# Patient Record
Sex: Female | Born: 1950 | Race: Black or African American | Hispanic: No | Smoking: Former smoker
Health system: Southern US, Community
[De-identification: ages and names within clinical notes are randomized; demographics above are authoritative.]

## PROBLEM LIST (undated history)

## (undated) DIAGNOSIS — G8929 Other chronic pain: Secondary | ICD-10-CM

## (undated) DIAGNOSIS — M25519 Pain in unspecified shoulder: Secondary | ICD-10-CM

## (undated) DIAGNOSIS — Z9889 Other specified postprocedural states: Secondary | ICD-10-CM

## (undated) DIAGNOSIS — R51 Headache: Secondary | ICD-10-CM

## (undated) DIAGNOSIS — K5790 Diverticulosis of intestine, part unspecified, without perforation or abscess without bleeding: Secondary | ICD-10-CM

## (undated) DIAGNOSIS — R112 Nausea with vomiting, unspecified: Secondary | ICD-10-CM

## (undated) DIAGNOSIS — J449 Chronic obstructive pulmonary disease, unspecified: Secondary | ICD-10-CM

## (undated) DIAGNOSIS — M1990 Unspecified osteoarthritis, unspecified site: Secondary | ICD-10-CM

## (undated) DIAGNOSIS — F419 Anxiety disorder, unspecified: Secondary | ICD-10-CM

## (undated) DIAGNOSIS — M545 Low back pain, unspecified: Secondary | ICD-10-CM

## (undated) DIAGNOSIS — F319 Bipolar disorder, unspecified: Secondary | ICD-10-CM

## (undated) DIAGNOSIS — M542 Cervicalgia: Secondary | ICD-10-CM

## (undated) DIAGNOSIS — R011 Cardiac murmur, unspecified: Secondary | ICD-10-CM

## (undated) DIAGNOSIS — C3490 Malignant neoplasm of unspecified part of unspecified bronchus or lung: Secondary | ICD-10-CM

## (undated) DIAGNOSIS — A0472 Enterocolitis due to Clostridium difficile, not specified as recurrent: Secondary | ICD-10-CM

## (undated) DIAGNOSIS — Z8719 Personal history of other diseases of the digestive system: Secondary | ICD-10-CM

## (undated) DIAGNOSIS — R569 Unspecified convulsions: Secondary | ICD-10-CM

## (undated) DIAGNOSIS — M51369 Other intervertebral disc degeneration, lumbar region without mention of lumbar back pain or lower extremity pain: Secondary | ICD-10-CM

## (undated) DIAGNOSIS — I10 Essential (primary) hypertension: Secondary | ICD-10-CM

## (undated) DIAGNOSIS — B1920 Unspecified viral hepatitis C without hepatic coma: Secondary | ICD-10-CM

## (undated) DIAGNOSIS — M5136 Other intervertebral disc degeneration, lumbar region: Secondary | ICD-10-CM

## (undated) DIAGNOSIS — K219 Gastro-esophageal reflux disease without esophagitis: Secondary | ICD-10-CM

## (undated) DIAGNOSIS — B1910 Unspecified viral hepatitis B without hepatic coma: Secondary | ICD-10-CM

## (undated) DIAGNOSIS — M5430 Sciatica, unspecified side: Secondary | ICD-10-CM

## (undated) HISTORY — PX: BRAIN SURGERY: SHX531

## (undated) HISTORY — PX: TUBAL LIGATION: SHX77

## (undated) HISTORY — PX: TONSILLECTOMY: SUR1361

---

## 1998-11-20 HISTORY — PX: OTHER SURGICAL HISTORY: SHX169

## 2007-11-21 HISTORY — PX: OTHER SURGICAL HISTORY: SHX169

## 2011-03-05 ENCOUNTER — Emergency Department (HOSPITAL_COMMUNITY)
Admission: EM | Admit: 2011-03-05 | Discharge: 2011-03-05 | Disposition: A | Discharge status: Home / Self Care | Payer: Medicaid - Out of State | Attending: Emergency Medicine | Admitting: Emergency Medicine

## 2011-03-05 DIAGNOSIS — M542 Cervicalgia: Secondary | ICD-10-CM | POA: Insufficient documentation

## 2011-03-05 DIAGNOSIS — M129 Arthropathy, unspecified: Secondary | ICD-10-CM | POA: Insufficient documentation

## 2011-03-05 DIAGNOSIS — Z7982 Long term (current) use of aspirin: Secondary | ICD-10-CM | POA: Insufficient documentation

## 2011-03-05 DIAGNOSIS — F319 Bipolar disorder, unspecified: Secondary | ICD-10-CM | POA: Insufficient documentation

## 2011-03-05 DIAGNOSIS — G8929 Other chronic pain: Secondary | ICD-10-CM | POA: Insufficient documentation

## 2011-03-05 DIAGNOSIS — M25519 Pain in unspecified shoulder: Secondary | ICD-10-CM | POA: Insufficient documentation

## 2011-03-05 DIAGNOSIS — E119 Type 2 diabetes mellitus without complications: Secondary | ICD-10-CM | POA: Insufficient documentation

## 2011-03-05 DIAGNOSIS — Z79899 Other long term (current) drug therapy: Secondary | ICD-10-CM | POA: Insufficient documentation

## 2011-03-05 DIAGNOSIS — I10 Essential (primary) hypertension: Secondary | ICD-10-CM | POA: Insufficient documentation

## 2011-03-05 DIAGNOSIS — K219 Gastro-esophageal reflux disease without esophagitis: Secondary | ICD-10-CM | POA: Insufficient documentation

## 2011-03-30 ENCOUNTER — Emergency Department (HOSPITAL_COMMUNITY): Payer: Medicaid Other

## 2011-03-30 ENCOUNTER — Emergency Department (HOSPITAL_COMMUNITY)
Admission: EM | Admit: 2011-03-30 | Discharge: 2011-03-30 | Disposition: A | Discharge status: Home / Self Care | Payer: Medicaid Other | Attending: Emergency Medicine | Admitting: Emergency Medicine

## 2011-03-30 DIAGNOSIS — Z79899 Other long term (current) drug therapy: Secondary | ICD-10-CM | POA: Insufficient documentation

## 2011-03-30 DIAGNOSIS — R0602 Shortness of breath: Secondary | ICD-10-CM | POA: Insufficient documentation

## 2011-03-30 DIAGNOSIS — R630 Anorexia: Secondary | ICD-10-CM | POA: Insufficient documentation

## 2011-03-30 DIAGNOSIS — F3130 Bipolar disorder, current episode depressed, mild or moderate severity, unspecified: Secondary | ICD-10-CM | POA: Insufficient documentation

## 2011-03-30 DIAGNOSIS — G4700 Insomnia, unspecified: Secondary | ICD-10-CM | POA: Insufficient documentation

## 2011-03-30 DIAGNOSIS — Z7982 Long term (current) use of aspirin: Secondary | ICD-10-CM | POA: Insufficient documentation

## 2011-03-30 DIAGNOSIS — R61 Generalized hyperhidrosis: Secondary | ICD-10-CM | POA: Insufficient documentation

## 2011-03-30 DIAGNOSIS — F411 Generalized anxiety disorder: Secondary | ICD-10-CM | POA: Insufficient documentation

## 2011-03-30 DIAGNOSIS — I10 Essential (primary) hypertension: Secondary | ICD-10-CM | POA: Insufficient documentation

## 2011-03-30 DIAGNOSIS — R450 Nervousness: Secondary | ICD-10-CM | POA: Insufficient documentation

## 2011-03-30 DIAGNOSIS — E119 Type 2 diabetes mellitus without complications: Secondary | ICD-10-CM | POA: Insufficient documentation

## 2011-03-30 LAB — POCT I-STAT, CHEM 8
BUN: 17 mg/dL (ref 6–23)
Hemoglobin: 14.6 g/dL (ref 12.0–15.0)
Potassium: 3.9 mEq/L (ref 3.5–5.1)
Sodium: 138 mEq/L (ref 135–145)
TCO2: 22 mmol/L (ref 0–100)

## 2011-03-30 LAB — POCT CARDIAC MARKERS: Myoglobin, poc: 57.3 ng/mL (ref 12–200)

## 2011-04-08 ENCOUNTER — Emergency Department (HOSPITAL_COMMUNITY)
Admission: EM | Admit: 2011-04-08 | Discharge: 2011-04-08 | Disposition: A | Discharge status: Home / Self Care | Payer: Medicaid Other | Attending: Emergency Medicine | Admitting: Emergency Medicine

## 2011-04-08 DIAGNOSIS — E119 Type 2 diabetes mellitus without complications: Secondary | ICD-10-CM | POA: Insufficient documentation

## 2011-04-08 DIAGNOSIS — Z79899 Other long term (current) drug therapy: Secondary | ICD-10-CM | POA: Insufficient documentation

## 2011-04-08 DIAGNOSIS — F319 Bipolar disorder, unspecified: Secondary | ICD-10-CM | POA: Insufficient documentation

## 2011-04-08 DIAGNOSIS — I10 Essential (primary) hypertension: Secondary | ICD-10-CM | POA: Insufficient documentation

## 2011-04-08 DIAGNOSIS — M47812 Spondylosis without myelopathy or radiculopathy, cervical region: Secondary | ICD-10-CM | POA: Insufficient documentation

## 2011-04-08 DIAGNOSIS — M542 Cervicalgia: Secondary | ICD-10-CM | POA: Insufficient documentation

## 2011-04-08 DIAGNOSIS — M5412 Radiculopathy, cervical region: Secondary | ICD-10-CM | POA: Insufficient documentation

## 2011-04-08 DIAGNOSIS — M79609 Pain in unspecified limb: Secondary | ICD-10-CM | POA: Insufficient documentation

## 2011-04-17 ENCOUNTER — Emergency Department (HOSPITAL_COMMUNITY): Payer: Medicaid Other

## 2011-04-17 ENCOUNTER — Emergency Department (HOSPITAL_COMMUNITY)
Admission: EM | Admit: 2011-04-17 | Discharge: 2011-04-18 | Disposition: A | Discharge status: Home / Self Care | Payer: Medicaid Other | Attending: Emergency Medicine | Admitting: Emergency Medicine

## 2011-04-17 DIAGNOSIS — E119 Type 2 diabetes mellitus without complications: Secondary | ICD-10-CM | POA: Insufficient documentation

## 2011-04-17 DIAGNOSIS — R1032 Left lower quadrant pain: Secondary | ICD-10-CM | POA: Insufficient documentation

## 2011-04-17 DIAGNOSIS — I10 Essential (primary) hypertension: Secondary | ICD-10-CM | POA: Insufficient documentation

## 2011-04-17 DIAGNOSIS — K4020 Bilateral inguinal hernia, without obstruction or gangrene, not specified as recurrent: Secondary | ICD-10-CM | POA: Insufficient documentation

## 2011-04-17 DIAGNOSIS — F319 Bipolar disorder, unspecified: Secondary | ICD-10-CM | POA: Insufficient documentation

## 2011-04-17 DIAGNOSIS — Z79899 Other long term (current) drug therapy: Secondary | ICD-10-CM | POA: Insufficient documentation

## 2011-04-18 ENCOUNTER — Encounter (HOSPITAL_COMMUNITY): Payer: Self-pay

## 2011-04-18 ENCOUNTER — Emergency Department (HOSPITAL_COMMUNITY): Payer: Medicaid Other

## 2011-04-18 LAB — URINALYSIS, ROUTINE W REFLEX MICROSCOPIC
Glucose, UA: NEGATIVE mg/dL
Hgb urine dipstick: NEGATIVE
Ketones, ur: NEGATIVE mg/dL
Protein, ur: NEGATIVE mg/dL

## 2011-04-18 LAB — CBC
MCH: 29.4 pg (ref 26.0–34.0)
MCV: 89.3 fL (ref 78.0–100.0)
Platelets: 240 10*3/uL (ref 150–400)
RDW: 12.9 % (ref 11.5–15.5)

## 2011-04-18 LAB — DIFFERENTIAL
Eosinophils Absolute: 0.2 10*3/uL (ref 0.0–0.7)
Eosinophils Relative: 3 % (ref 0–5)
Lymphs Abs: 2.2 10*3/uL (ref 0.7–4.0)
Monocytes Relative: 9 % (ref 3–12)

## 2011-04-18 LAB — COMPREHENSIVE METABOLIC PANEL
Albumin: 3.6 g/dL (ref 3.5–5.2)
BUN: 9 mg/dL (ref 6–23)
Creatinine, Ser: 0.49 mg/dL (ref 0.4–1.2)
Total Bilirubin: 0.2 mg/dL — ABNORMAL LOW (ref 0.3–1.2)
Total Protein: 6.7 g/dL (ref 6.0–8.3)

## 2011-04-25 ENCOUNTER — Emergency Department (HOSPITAL_COMMUNITY): Payer: Medicaid Other

## 2011-04-25 ENCOUNTER — Emergency Department (HOSPITAL_COMMUNITY)
Admission: EM | Admit: 2011-04-25 | Discharge: 2011-04-25 | Disposition: A | Discharge status: Home / Self Care | Payer: Medicaid Other | Attending: Emergency Medicine | Admitting: Emergency Medicine

## 2011-04-25 DIAGNOSIS — K644 Residual hemorrhoidal skin tags: Secondary | ICD-10-CM | POA: Insufficient documentation

## 2011-04-25 DIAGNOSIS — Z79899 Other long term (current) drug therapy: Secondary | ICD-10-CM | POA: Insufficient documentation

## 2011-04-25 DIAGNOSIS — N949 Unspecified condition associated with female genital organs and menstrual cycle: Secondary | ICD-10-CM | POA: Insufficient documentation

## 2011-04-25 DIAGNOSIS — R059 Cough, unspecified: Secondary | ICD-10-CM | POA: Insufficient documentation

## 2011-04-25 DIAGNOSIS — I10 Essential (primary) hypertension: Secondary | ICD-10-CM | POA: Insufficient documentation

## 2011-04-25 DIAGNOSIS — N898 Other specified noninflammatory disorders of vagina: Secondary | ICD-10-CM | POA: Insufficient documentation

## 2011-04-25 DIAGNOSIS — F319 Bipolar disorder, unspecified: Secondary | ICD-10-CM | POA: Insufficient documentation

## 2011-04-25 DIAGNOSIS — E119 Type 2 diabetes mellitus without complications: Secondary | ICD-10-CM | POA: Insufficient documentation

## 2011-04-25 DIAGNOSIS — R109 Unspecified abdominal pain: Secondary | ICD-10-CM | POA: Insufficient documentation

## 2011-04-25 DIAGNOSIS — R05 Cough: Secondary | ICD-10-CM | POA: Insufficient documentation

## 2011-04-25 DIAGNOSIS — Z7982 Long term (current) use of aspirin: Secondary | ICD-10-CM | POA: Insufficient documentation

## 2011-04-25 DIAGNOSIS — R300 Dysuria: Secondary | ICD-10-CM | POA: Insufficient documentation

## 2011-04-25 LAB — WET PREP, GENITAL: Yeast Wet Prep HPF POC: NONE SEEN

## 2011-04-25 LAB — COMPREHENSIVE METABOLIC PANEL
Albumin: 3.5 g/dL (ref 3.5–5.2)
BUN: 13 mg/dL (ref 6–23)
Chloride: 103 mEq/L (ref 96–112)
Creatinine, Ser: 0.48 mg/dL (ref 0.4–1.2)
GFR calc non Af Amer: >60 mL/min (ref >60)
Glucose, Bld: 96 mg/dL (ref 70–99)
Total Bilirubin: 0.2 mg/dL — ABNORMAL LOW (ref 0.3–1.2)

## 2011-04-25 LAB — URINALYSIS, ROUTINE W REFLEX MICROSCOPIC
Glucose, UA: NEGATIVE mg/dL
Protein, ur: NEGATIVE mg/dL
pH: 7 (ref 5.0–8.0)

## 2011-04-26 LAB — GC/CHLAMYDIA PROBE AMP, GENITAL
Chlamydia, DNA Probe: NEGATIVE
GC Probe Amp, Genital: NEGATIVE

## 2011-05-13 ENCOUNTER — Emergency Department (HOSPITAL_COMMUNITY)
Admission: EM | Admit: 2011-05-13 | Discharge: 2011-05-13 | Disposition: A | Discharge status: Home / Self Care | Payer: Medicaid Other | Attending: Emergency Medicine | Admitting: Emergency Medicine

## 2011-05-13 DIAGNOSIS — I10 Essential (primary) hypertension: Secondary | ICD-10-CM | POA: Insufficient documentation

## 2011-05-13 DIAGNOSIS — Z79899 Other long term (current) drug therapy: Secondary | ICD-10-CM | POA: Insufficient documentation

## 2011-05-13 DIAGNOSIS — M25519 Pain in unspecified shoulder: Secondary | ICD-10-CM | POA: Insufficient documentation

## 2011-05-13 DIAGNOSIS — E119 Type 2 diabetes mellitus without complications: Secondary | ICD-10-CM | POA: Insufficient documentation

## 2011-05-13 DIAGNOSIS — F3130 Bipolar disorder, current episode depressed, mild or moderate severity, unspecified: Secondary | ICD-10-CM | POA: Insufficient documentation

## 2011-05-13 DIAGNOSIS — M542 Cervicalgia: Secondary | ICD-10-CM | POA: Insufficient documentation

## 2011-05-13 DIAGNOSIS — M719 Bursopathy, unspecified: Secondary | ICD-10-CM | POA: Insufficient documentation

## 2011-05-13 DIAGNOSIS — M67919 Unspecified disorder of synovium and tendon, unspecified shoulder: Secondary | ICD-10-CM | POA: Insufficient documentation

## 2011-05-13 DIAGNOSIS — G8929 Other chronic pain: Secondary | ICD-10-CM | POA: Insufficient documentation

## 2011-09-09 ENCOUNTER — Emergency Department (HOSPITAL_COMMUNITY)
Admission: EM | Admit: 2011-09-09 | Discharge: 2011-09-09 | Disposition: A | Discharge status: Home / Self Care | Payer: Medicaid Other | Attending: Emergency Medicine | Admitting: Emergency Medicine

## 2011-09-09 DIAGNOSIS — G8929 Other chronic pain: Secondary | ICD-10-CM | POA: Insufficient documentation

## 2011-09-09 DIAGNOSIS — F319 Bipolar disorder, unspecified: Secondary | ICD-10-CM | POA: Insufficient documentation

## 2011-09-09 DIAGNOSIS — E119 Type 2 diabetes mellitus without complications: Secondary | ICD-10-CM | POA: Insufficient documentation

## 2011-09-09 DIAGNOSIS — M542 Cervicalgia: Secondary | ICD-10-CM | POA: Insufficient documentation

## 2011-09-09 DIAGNOSIS — M129 Arthropathy, unspecified: Secondary | ICD-10-CM | POA: Insufficient documentation

## 2011-09-09 DIAGNOSIS — Z79899 Other long term (current) drug therapy: Secondary | ICD-10-CM | POA: Insufficient documentation

## 2011-09-09 DIAGNOSIS — M25519 Pain in unspecified shoulder: Secondary | ICD-10-CM | POA: Insufficient documentation

## 2011-09-09 DIAGNOSIS — I10 Essential (primary) hypertension: Secondary | ICD-10-CM | POA: Insufficient documentation

## 2011-09-09 DIAGNOSIS — M62838 Other muscle spasm: Secondary | ICD-10-CM | POA: Insufficient documentation

## 2011-11-09 ENCOUNTER — Emergency Department (HOSPITAL_COMMUNITY)
Admission: EM | Admit: 2011-11-09 | Discharge: 2011-11-09 | Disposition: A | Discharge status: Home / Self Care | Payer: Medicaid Other | Attending: Emergency Medicine | Admitting: Emergency Medicine

## 2011-11-09 ENCOUNTER — Encounter (HOSPITAL_COMMUNITY): Payer: Self-pay | Admitting: Emergency Medicine

## 2011-11-09 DIAGNOSIS — F319 Bipolar disorder, unspecified: Secondary | ICD-10-CM | POA: Insufficient documentation

## 2011-11-09 DIAGNOSIS — E119 Type 2 diabetes mellitus without complications: Secondary | ICD-10-CM | POA: Insufficient documentation

## 2011-11-09 DIAGNOSIS — F17200 Nicotine dependence, unspecified, uncomplicated: Secondary | ICD-10-CM | POA: Insufficient documentation

## 2011-11-09 DIAGNOSIS — M79609 Pain in unspecified limb: Secondary | ICD-10-CM | POA: Insufficient documentation

## 2011-11-09 DIAGNOSIS — G8929 Other chronic pain: Secondary | ICD-10-CM | POA: Insufficient documentation

## 2011-11-09 DIAGNOSIS — M25519 Pain in unspecified shoulder: Secondary | ICD-10-CM | POA: Insufficient documentation

## 2011-11-09 DIAGNOSIS — M542 Cervicalgia: Secondary | ICD-10-CM | POA: Insufficient documentation

## 2011-11-09 DIAGNOSIS — I10 Essential (primary) hypertension: Secondary | ICD-10-CM | POA: Insufficient documentation

## 2011-11-09 DIAGNOSIS — M25512 Pain in left shoulder: Secondary | ICD-10-CM

## 2011-11-09 HISTORY — DX: Bipolar disorder, unspecified: F31.9

## 2011-11-09 HISTORY — DX: Essential (primary) hypertension: I10

## 2011-11-09 MED ORDER — HYDROMORPHONE HCL PF 2 MG/ML IJ SOLN
2.0000 mg | Freq: Once | INTRAMUSCULAR | Status: AC
  Administered 2011-11-09: 2 mg via INTRAMUSCULAR
  Filled 2011-11-09: qty 1

## 2011-11-09 MED ORDER — DEXAMETHASONE SODIUM PHOSPHATE 10 MG/ML IJ SOLN
10.0000 mg | Freq: Once | INTRAMUSCULAR | Status: AC
  Administered 2011-11-09: 10 mg via INTRAMUSCULAR
  Filled 2011-11-09: qty 1

## 2011-11-09 MED ORDER — DEXAMETHASONE SODIUM PHOSPHATE 10 MG/ML IJ SOLN: 10.0000 mg | Freq: Once | INTRAMUSCULAR | Status: DC

## 2011-11-09 MED ORDER — ONDANSETRON 8 MG PO TBDP
8.0000 mg | ORAL_TABLET | Freq: Once | ORAL | Status: AC
  Administered 2011-11-09: 8 mg via ORAL
  Filled 2011-11-09: qty 1

## 2011-11-09 NOTE — ED Provider Notes (Signed)
History     CSN: [id number]  Arrival date & time 11/09/11  4098   First MD Initiated Contact with Patient 11/09/11 2057      Chief Complaint  Patient presents with  . Shoulder Pain    left  . Arm Pain    (Consider location/radiation/quality/duration/timing/severity/associated sxs/prior treatment) HPI Comments: Patient with chronic left shoulder and neck pain that shoots into left arm. She thinks that she has a pinched nerve. Patient admits to taking hydrocodone as prescribed by her primary care physician. She has continued to do this and has had worsening pain over the past 2 days. She states that her primary care physician gives her a steroid shot and pain medicine when it flares up. She is requesting this in the emergency department. Patient denies weakness, numbness, or tingling or new injury to her neck or shoulder.  [name] a 60 y.o. female presenting with shoulder pain. The history is provided by the patient.  Shoulder Pain This is a chronic problem. The current episode started yesterday. Associated symptoms include arthralgias. Pertinent negatives include no joint swelling, myalgias, neck pain, numbness or weakness. The symptoms are aggravated by bending. She has tried oral narcotics for the symptoms.    Past Medical History  Diagnosis Date  . Hypertension   . Diabetes mellitus   . Bipolar 1 disorder     Past Surgical History  Procedure Date  . Brain surgery   . Facial tumor removal   . Brain tumor removal     History reviewed. No pertinent family history.  History  Substance Use Topics  . Smoking status: Current Some Day Smoker  . Smokeless tobacco: Not on file  . Alcohol Use: No    OB History    Grav Para Term Preterm Abortions TAB SAB Ect Mult Living                  Review of Systems  HENT: Negative for neck pain.   Musculoskeletal: Positive for arthralgias. Negative for myalgias and joint swelling.  Skin: Negative for color change and wound.    Neurological: Negative for weakness and numbness.    Allergies  Haldol and Morphine and related  Home Medications   Current Outpatient Rx  Name Route Sig Dispense Refill  . ALBUTEROL SULFATE HFA 108 (90 BASE) MCG/ACT IN AERS Inhalation Inhale 2 puffs into the lungs every 6 (six) hours as needed. bronchitis     . ASPIRIN 81 MG PO TABS Oral Take 81 mg by mouth daily.      Marland Kitchen VITAMIN D 1000 UNITS PO TABS Oral Take 1,000 Units by mouth daily.      . CYCLOBENZAPRINE HCL 10 MG PO TABS Oral Take 10 mg by mouth 3 (three) times daily as needed. For shoulder/ neck pain     . GABAPENTIN 300 MG PO CAPS Oral Take 300 mg by mouth 3 (three) times daily.      Marland Kitchen HYDROCODONE-ACETAMINOPHEN 10-500 MG PO TABS Oral Take 1 tablet by mouth every 6 (six) hours as needed. For shoulder and neck pain     . METFORMIN HCL 500 MG PO TABS Oral Take 500 mg by mouth 2 (two) times daily with a meal.      . OLMESARTAN-AMLODIPINE-HCTZ 40-10-25 MG PO TABS Oral Take 1 tablet by mouth daily.      Marland Kitchen PHENYTOIN SODIUM EXTENDED 100 MG PO CAPS Oral Take 300 mg by mouth at bedtime.        BP 109/60  Pulse 94  Temp(Src) 98.2 F (36.8 C) (Oral)  Resp 16  SpO2 97%  Physical Exam  Nursing note and vitals reviewed. Constitutional: She is oriented to person, place, and time. She appears well-developed and well-nourished.  HENT:  Head: Normocephalic and atraumatic.  Neck: Normal range of motion. Neck supple.  Musculoskeletal: She exhibits tenderness. She exhibits no edema.       Diffuse tenderness over superior and posterior shoulder. Patient with full range of motion in shoulder with some mild discomfort.  Neurological: She is alert and oriented to person, place, and time.       Distal motor, sensation, and vascular intact. 2+ radial pulses bilaterally. 5 out of 5 strength in bilateral upper extremities.  Skin: Skin is warm and dry. No erythema.  Psychiatric: She has a normal mood and affect. Her behavior is normal.    ED  Course  Procedures (including critical care time)  Labs Reviewed - No data to display No results found.   1. Left shoulder pain     9:21 PM patient seen and examined. Patient has been forthcoming with her narcotics prescriptions. Will give IM injection of Dilaudid. Will give IM Decadron given patient's history of diabetes. Urged patient to closely monitor her blood sugars over the next 24-48 hours. Urged patient to followup with her primary care physician as needed. Urged patient to take her home pain medications as directed.  MDM  Patient with chronic shoulder pain, likely cervical radiculopathy. This is a flare of her usual pain. She does not have any weakness in her upper extremities. Will treat pain.        Eustace Moore [place], [place] 11/09/11 2123

## 2011-11-09 NOTE — ED Notes (Addendum)
Pt presented to the ER with c/o left shoulder and arm pain, pt states that she had a surgery 2009, brain tumor removal, and that since then pt started to notice left side pains. Pt also reports muscle cramps in the hand and swelling to the left shoulder and neck area. Pain level at this time pt reports is 10/10. Pt able to move arm, shoulder and neck in all directions, normal ROM, denies fever, neck stiffness. Pt states s/s are chronic. Pt taking Rx (Lorteb) and OTC pain medications, were helping however at this time no relief reported.

## 2011-11-10 NOTE — ED Provider Notes (Signed)
Medical screening examination/treatment/procedure(s) were performed by non-physician practitioner and as supervising physician I was immediately available for consultation/collaboration.   Shelda Jakes, MD 11/10/11 [phone]

## 2011-12-04 ENCOUNTER — Encounter (HOSPITAL_COMMUNITY): Payer: Self-pay | Admitting: *Deleted

## 2011-12-04 ENCOUNTER — Emergency Department (HOSPITAL_COMMUNITY)
Admission: EM | Admit: 2011-12-04 | Discharge: 2011-12-04 | Disposition: A | Discharge status: Home / Self Care | Payer: Medicaid Other | Attending: Emergency Medicine | Admitting: Emergency Medicine

## 2011-12-04 DIAGNOSIS — M542 Cervicalgia: Secondary | ICD-10-CM | POA: Insufficient documentation

## 2011-12-04 DIAGNOSIS — M25519 Pain in unspecified shoulder: Secondary | ICD-10-CM | POA: Insufficient documentation

## 2011-12-04 DIAGNOSIS — F319 Bipolar disorder, unspecified: Secondary | ICD-10-CM | POA: Insufficient documentation

## 2011-12-04 DIAGNOSIS — G8929 Other chronic pain: Secondary | ICD-10-CM | POA: Insufficient documentation

## 2011-12-04 DIAGNOSIS — Z79899 Other long term (current) drug therapy: Secondary | ICD-10-CM | POA: Insufficient documentation

## 2011-12-04 DIAGNOSIS — E119 Type 2 diabetes mellitus without complications: Secondary | ICD-10-CM | POA: Insufficient documentation

## 2011-12-04 DIAGNOSIS — R29898 Other symptoms and signs involving the musculoskeletal system: Secondary | ICD-10-CM | POA: Insufficient documentation

## 2011-12-04 DIAGNOSIS — I10 Essential (primary) hypertension: Secondary | ICD-10-CM | POA: Insufficient documentation

## 2011-12-04 MED ORDER — HYDROMORPHONE HCL PF 2 MG/ML IJ SOLN
2.0000 mg | Freq: Once | INTRAMUSCULAR | Status: AC
  Administered 2011-12-04: 2 mg via INTRAVENOUS
  Filled 2011-12-04: qty 1

## 2011-12-04 MED ORDER — DEXAMETHASONE SODIUM PHOSPHATE 10 MG/ML IJ SOLN
10.0000 mg | Freq: Once | INTRAMUSCULAR | Status: AC
  Administered 2011-12-04: 10 mg via INTRAMUSCULAR
  Filled 2011-12-04: qty 1

## 2011-12-04 NOTE — ED Notes (Signed)
Pt in c/o left shoulder and arm pain, states this is a chronic problem and has tried taking medication at home with no relief, pain increased with movement

## 2011-12-04 NOTE — ED Provider Notes (Signed)
History     CSN: [id number]  Arrival date & time 12/04/11  7829   First MD Initiated Contact with Patient 12/04/11 2200      Chief Complaint  Patient presents with  . Shoulder Pain    (Consider location/radiation/quality/duration/timing/severity/associated sxs/prior treatment) [name] a 61 y.o. female presenting with shoulder pain. The history is provided by the patient. No language interpreter was used.  Shoulder Pain This is a chronic problem. The current episode started in the past 7 days. The problem occurs constantly. The problem has been gradually worsening. Associated symptoms include neck pain. Pertinent negatives include no fever, numbness or weakness. Exacerbated by: abduction and adduction of L shoulder. Treatments tried: flexeril, neurontin, hydrocodone.  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 States  Weakness noted to LUE since the surgery.    Has appointment on 12/12/2011 with orthopedic doctor. Was her 1 month ago for the same.  Probable cervical radiculopathy. Taking narcotic pain meds at home with no relief. States that her PCP has given her a steroid injection for the pain before.  Received an injection in the ER 3 weeks ago for dilaudid and decadron with relief.  Good sensation and painful with movement.   Past Medical History  Diagnosis Date  . Hypertension   . Diabetes mellitus   . Bipolar 1 disorder     Past Surgical History  Procedure Date  . Brain surgery   . Facial tumor removal   . Brain tumor removal     History reviewed. No pertinent family history.  History  Substance Use Topics  . Smoking status: Current Some Day Smoker  . Smokeless tobacco: Not on file  . Alcohol Use: No    OB History    Grav Para Term Preterm Abortions TAB SAB Ect Mult Living                  Review of Systems  Constitutional: Negative for fever.  HENT: Positive for neck pain.   Neurological: Negative for weakness and  numbness.  All other systems reviewed and are negative.    Allergies  Haldol and Morphine and related  Home Medications   Current Outpatient Rx  Name Route Sig Dispense Refill  . ALBUTEROL SULFATE HFA 108 (90 BASE) MCG/ACT IN AERS Inhalation Inhale 2 puffs into the lungs every 6 (six) hours as needed. bronchitis     . ASPIRIN 81 MG PO TABS Oral Take 81 mg by mouth daily.      Marland Kitchen VITAMIN D 1000 UNITS PO TABS Oral Take 1,000 Units by mouth daily.      . CYCLOBENZAPRINE HCL 10 MG PO TABS Oral Take 10 mg by mouth 3 (three) times daily as needed. For shoulder/ neck pain     . GABAPENTIN 300 MG PO CAPS Oral Take 300 mg by mouth 3 (three) times daily.      Marland Kitchen HYDROCODONE-ACETAMINOPHEN 10-500 MG PO TABS Oral Take 1 tablet by mouth every 6 (six) hours as needed. For shoulder and neck pain     . METFORMIN HCL 500 MG PO TABS Oral Take 500 mg by mouth 2 (two) times daily with a meal.      . OLMESARTAN-AMLODIPINE-HCTZ 40-10-25 MG PO TABS Oral Take 1 tablet by mouth daily.      Marland Kitchen PHENYTOIN SODIUM EXTENDED 100 MG PO CAPS Oral Take 300 mg by mouth at bedtime.        BP 121/74  Pulse 91  Temp(Src) 98.7 F (37.1 C) (Oral)  Resp 14  SpO2 98%  Physical Exam  Nursing note and vitals reviewed. Constitutional: She is oriented to person, place, and time. She appears well-developed and well-nourished. No distress.  HENT:  Head: Normocephalic.  Eyes: Pupils are equal, round, and reactive to light.  Cardiovascular: Normal rate.   Pulmonary/Chest: Effort normal.  Musculoskeletal:       Superior and posterior L shoulder pain with palpatation.  Good radial pulse and sensation.  Weakness noted not new per patient.  Neurological: She is alert and oriented to person, place, and time.  Skin: Skin is warm and dry.  Psychiatric: She has a normal mood and affect.    ED Course  Procedures (including critical care time)  Labs Reviewed - No data to display No results found.   No diagnosis found.    MDM   L neck shoulder pain that is chronic since her brain surgery.  On narcotics, neurontin and flexeril for pain at home.  Received dilaudid and decadron today with relief as she did 3 weeks ago.  Ortho appointment on 1/18.  2+ radial pulse, good sensation,  4/5 strength not new per patient.          Jethro Bastos, NP 12/05/11 2028

## 2011-12-06 NOTE — ED Provider Notes (Signed)
Medical screening examination/treatment/procedure(s) were performed by non-physician practitioner and as supervising physician I was immediately available for consultation/collaboration.  Aeson Sawyers, MD 12/06/11 1407 

## 2011-12-16 ENCOUNTER — Encounter (HOSPITAL_COMMUNITY): Payer: Self-pay | Admitting: *Deleted

## 2011-12-16 ENCOUNTER — Emergency Department (HOSPITAL_COMMUNITY)
Admission: EM | Admit: 2011-12-16 | Discharge: 2011-12-16 | Disposition: A | Discharge status: Home / Self Care | Payer: Medicaid Other | Attending: Emergency Medicine | Admitting: Emergency Medicine

## 2011-12-16 DIAGNOSIS — G8929 Other chronic pain: Secondary | ICD-10-CM | POA: Insufficient documentation

## 2011-12-16 DIAGNOSIS — E119 Type 2 diabetes mellitus without complications: Secondary | ICD-10-CM | POA: Insufficient documentation

## 2011-12-16 DIAGNOSIS — I10 Essential (primary) hypertension: Secondary | ICD-10-CM | POA: Insufficient documentation

## 2011-12-16 DIAGNOSIS — M542 Cervicalgia: Secondary | ICD-10-CM | POA: Insufficient documentation

## 2011-12-16 MED ORDER — DEXAMETHASONE SODIUM PHOSPHATE 10 MG/ML IJ SOLN
10.0000 mg | Freq: Once | INTRAMUSCULAR | Status: AC
  Administered 2011-12-16: 10 mg via INTRAMUSCULAR
  Filled 2011-12-16: qty 1

## 2011-12-16 MED ORDER — KETOROLAC TROMETHAMINE 60 MG/2ML IM SOLN
60.0000 mg | Freq: Once | INTRAMUSCULAR | Status: AC
  Administered 2011-12-16: 60 mg via INTRAMUSCULAR
  Filled 2011-12-16: qty 2

## 2011-12-16 MED ORDER — HYDROCODONE-ACETAMINOPHEN 10-500 MG PO TABS: 1.0000 | ORAL_TABLET | Freq: Three times a day (TID) | ORAL | Status: AC | PRN

## 2011-12-16 MED ORDER — ONDANSETRON 4 MG PO TBDP
ORAL_TABLET | ORAL | Status: AC
  Filled 2011-12-16: qty 1

## 2011-12-16 NOTE — ED Provider Notes (Signed)
History     CSN: [id number]  Arrival date & time 12/16/11  1522   First MD Initiated Contact with Patient 12/16/11 1752     6:00 PM HPI Reports chronic neck pain and left shoulder pain States she ran out of her medication this morning at 0500AM. States persistently worsening chronic neck pain. Denies re-injuring her neck. Denies numbness tingling or weakness on extremities unable to get an appt today since closed due to ice. [name] a 61 y.o. female presenting with neck pain. The history is provided by the patient.  Neck Pain  This is a chronic problem. The problem occurs constantly. The problem has been gradually worsening. The pain is associated with nothing. There has been no fever. Pain location: Approximately at C7. The quality of the pain is described as aching. The pain is moderate. The symptoms are aggravated by twisting. Pertinent negatives include no numbness, no headaches, no paresis, no tingling and no weakness. She has tried heat for the symptoms. The treatment provided no relief.    Past Medical History  Diagnosis Date  . Hypertension   . Diabetes mellitus   . Bipolar 1 disorder     Past Surgical History  Procedure Date  . Brain surgery   . Facial tumor removal   . Brain tumor removal     History reviewed. No pertinent family history.  History  Substance Use Topics  . Smoking status: Current Some Day Smoker -- 0.5 packs/day    Types: Cigarettes  . Smokeless tobacco: Never Used  . Alcohol Use: No    OB History    Grav Para Term Preterm Abortions TAB SAB Ect Mult Living                  Review of Systems  Constitutional: Negative for fever and chills.  HENT: Positive for neck pain.   Musculoskeletal: Negative for myalgias and back pain.  Neurological: Negative for dizziness, tingling, weakness, light-headedness, numbness and headaches.  All other systems reviewed and are negative.    Allergies  Haldol and Morphine and related  Home Medications     Current Outpatient Rx  Name Route Sig Dispense Refill  . ASPIRIN 81 MG PO TABS Oral Take 81 mg by mouth daily.      Marland Kitchen VITAMIN D 1000 UNITS PO TABS Oral Take 1,000 Units by mouth daily.      Marland Kitchen GABAPENTIN 300 MG PO CAPS Oral Take 300 mg by mouth 3 (three) times daily.      Marland Kitchen METFORMIN HCL 500 MG PO TABS Oral Take 500 mg by mouth 2 (two) times daily with a meal.      . OLMESARTAN-AMLODIPINE-HCTZ 40-10-25 MG PO TABS Oral Take 1 tablet by mouth daily.      Marland Kitchen PHENYTOIN SODIUM EXTENDED 100 MG PO CAPS Oral Take 300 mg by mouth at bedtime.        BP 126/68  Pulse 92  Temp(Src) 98.7 F (37.1 C) (Oral)  Resp 20  SpO2 98%  Physical Exam  Vitals reviewed. Constitutional: She is oriented to person, place, and time. Vital signs are normal. She appears well-developed and well-nourished. No distress.  HENT:  Head: Normocephalic and atraumatic.  Eyes: Pupils are equal, round, and reactive to light.  Neck: Normal range of motion. Neck supple. No spinous process tenderness and no muscular tenderness present. No rigidity. Normal range of motion present.  Pulmonary/Chest: Effort normal.  Musculoskeletal:       Left shoulder: She exhibits decreased  range of motion (negative drop test), tenderness and pain. She exhibits no bony tenderness, no effusion, no laceration and no spasm.       Cervical back: She exhibits tenderness, bony tenderness, pain and spasm. She exhibits normal range of motion, no swelling, no laceration and normal pulse.       Back:       Arms: Neurological: She is alert and oriented to person, place, and time.  Skin: Skin is warm and dry. No rash noted. No erythema. No pallor.  Psychiatric: She has a normal mood and affect. Her behavior is normal.    ED Course  Procedures   MDM   Will treat with 2 days of lortab for chronic pain. Discussed chronic pain management. And need for close follow-up.      Thomasene Lot, PA-C 12/16/11 1809

## 2011-12-16 NOTE — ED Notes (Signed)
Pt from home c/o neck pain and ran out of pain meds Friday, hx of chronic neck pain, tried heating pad with minimal relief but "it came back", sees Dr. Ave Filter.

## 2011-12-17 NOTE — ED Provider Notes (Signed)
Medical screening examination/treatment/procedure(s) were performed by non-physician practitioner and as supervising physician I was immediately available for consultation/collaboration.  Ragna Kramlich, MD 12/17/11 1034 

## 2012-02-25 ENCOUNTER — Emergency Department (HOSPITAL_COMMUNITY): Payer: Medicaid Other

## 2012-02-25 ENCOUNTER — Emergency Department (HOSPITAL_COMMUNITY)
Admission: EM | Admit: 2012-02-25 | Discharge: 2012-02-25 | Disposition: A | Discharge status: Home / Self Care | Payer: Medicaid Other | Attending: Emergency Medicine | Admitting: Emergency Medicine

## 2012-02-25 ENCOUNTER — Encounter (HOSPITAL_COMMUNITY): Payer: Self-pay | Admitting: *Deleted

## 2012-02-25 DIAGNOSIS — I10 Essential (primary) hypertension: Secondary | ICD-10-CM | POA: Insufficient documentation

## 2012-02-25 DIAGNOSIS — R51 Headache: Secondary | ICD-10-CM

## 2012-02-25 DIAGNOSIS — Z79899 Other long term (current) drug therapy: Secondary | ICD-10-CM | POA: Insufficient documentation

## 2012-02-25 DIAGNOSIS — H5710 Ocular pain, unspecified eye: Secondary | ICD-10-CM | POA: Insufficient documentation

## 2012-02-25 DIAGNOSIS — E119 Type 2 diabetes mellitus without complications: Secondary | ICD-10-CM | POA: Insufficient documentation

## 2012-02-25 DIAGNOSIS — M542 Cervicalgia: Secondary | ICD-10-CM | POA: Insufficient documentation

## 2012-02-25 HISTORY — DX: Cardiac murmur, unspecified: R01.1

## 2012-02-25 MED ORDER — KETOROLAC TROMETHAMINE 30 MG/ML IJ SOLN
30.0000 mg | Freq: Once | INTRAMUSCULAR | Status: AC
  Administered 2012-02-25: 30 mg via INTRAMUSCULAR
  Filled 2012-02-25: qty 1

## 2012-02-25 MED ORDER — DIAZEPAM 5 MG PO TABS: 5.0000 mg | ORAL_TABLET | Freq: Three times a day (TID) | ORAL | Status: AC | PRN

## 2012-02-25 NOTE — ED Notes (Signed)
Pt reports that she has had a headache and left eye pain since yesterday. Pt reports blurred vision and photophobia. Pt denies history of eye issues. Pt denies known injury

## 2012-02-25 NOTE — Discharge Instructions (Signed)
Return here as needed. Increase your fluid intake. Follow up with your doctor for a recheck.

## 2012-02-25 NOTE — ED Provider Notes (Signed)
History     CSN: [id number]  Arrival date & time 02/25/12  1411   First MD Initiated Contact with Patient 02/25/12 1624      Chief Complaint  Patient presents with  . Headache  . Eye Pain    (Consider location/radiation/quality/duration/timing/severity/associated sxs/prior treatment) HPI [name] here for headache.  Has been persistent over the last few months.  She states, that she has hadpain with her neck and she is like that contributes to her headaches.  Patient, states she has got an appointment with a neurologist coming up.  She states that she does not have any visual changes, nausea/vomiting, weakness, tingling, numbness, chest pain, shortness of breath, abdominal pain, loss of vision or syncope.  She states that her headache.  Patient states that she some pain behind her left eye at times with these headaches. Past Medical History  Diagnosis Date  . Hypertension   . Diabetes mellitus   . Bipolar 1 disorder   . Heart murmur     Past Surgical History  Procedure Date  . Brain surgery   . Facial tumor removal   . Brain tumor removal     No family history on file.  History  Substance Use Topics  . Smoking status: Current Some Day Smoker -- 0.5 packs/day    Types: Cigarettes  . Smokeless tobacco: Never Used  . Alcohol Use: No    OB History    Grav Para Term Preterm Abortions TAB SAB Ect Mult Living                  Review of Systems All pertinent positives and negatives reviewed in the history of present illness  Allergies  Haldol and Morphine and related  Home Medications   Current Outpatient Rx  Name Route Sig Dispense Refill  . ALBUTEROL SULFATE HFA 108 (90 BASE) MCG/ACT IN AERS Inhalation Inhale 2 puffs into the lungs every 6 (six) hours as needed. For shortness of breath    . ASPIRIN 81 MG PO TABS Oral Take 81 mg by mouth daily.      Marland Kitchen VITAMIN D 1000 UNITS PO TABS Oral Take 1,000 Units by mouth daily.      . DEXLANSOPRAZOLE 60 MG PO CPDR Oral  Take 60 mg by mouth daily.    Marland Kitchen GABAPENTIN 300 MG PO CAPS Oral Take 300 mg by mouth 3 (three) times daily.      Marland Kitchen HYDROCODONE-ACETAMINOPHEN 10-500 MG PO TABS Oral Take 1 tablet by mouth every 6 (six) hours as needed. For pain    . METFORMIN HCL 500 MG PO TABS Oral Take 500 mg by mouth 2 (two) times daily with a meal.      . OLMESARTAN-AMLODIPINE-HCTZ 40-5-25 MG PO TABS Oral Take 1 tablet by mouth daily.    Marland Kitchen PHENYTOIN SODIUM EXTENDED 100 MG PO CAPS Oral Take 300 mg by mouth at bedtime.        BP 112/62  Pulse 98  Temp(Src) 99.3 F (37.4 C) (Oral)  Resp 16  SpO2 96%  Physical Exam  Constitutional: She is oriented to person, place, and time. She appears well-developed and well-nourished. No distress.  HENT:  Head: Normocephalic and atraumatic.  Mouth/Throat: No oropharyngeal exudate.  Eyes: EOM are normal. Pupils are equal, round, and reactive to light.  Neck: Normal range of motion. Neck supple.  Cardiovascular: Normal rate, regular rhythm and normal heart sounds.  Exam reveals no gallop and no friction rub.   No murmur heard. Pulmonary/Chest:  Effort normal and breath sounds normal. No respiratory distress. She has no rales.  Neurological: She is alert and oriented to person, place, and time. She has normal strength. No cranial nerve deficit or sensory deficit. She exhibits normal muscle tone. Coordination and gait normal. GCS eye subscore is 4. GCS verbal subscore is 5. GCS motor subscore is 6.  Skin: Skin is warm and dry. No rash noted.    ED Course  Procedures (including critical care time)  Labs Reviewed - No data to display Ct Head Wo Contrast  02/25/2012  *RADIOLOGY REPORT*  Clinical Data: Headaches and left eye pain for 1 day.  Blurred vision with photophobia.  Prior brain tumor removal 2009.  No chemotherapy radiation therapy.  High blood pressure and diabetes.  CT HEAD WITHOUT CONTRAST  Technique:  Contiguous axial images were obtained from the base of the skull through the  vertex without contrast.  Comparison: None.  Findings: Post left frontal craniotomy.  Left frontal lobe encephalomalacia.  No evidence of intracranial mass lesion detected on this unenhanced exam.  If there are progressive symptoms, follow-up imaging with contrast (preferably MRI) may be considered.  No intracranial hemorrhage.  No CT evidence of large acute infarct. Mild atrophy without hydrocephalus.  Orbital structures appear be grossly intact.  Visualized sinuses, mastoid air cells and middle ear cavities clear.  IMPRESSION: Prior left frontal lobe surgery with encephalomalacia.  No intracranial hemorrhage or CT evidence of large acute infarct. Please see above.  Original Report Authenticated By: Fuller Canada, M.D.     Patient has no neurological deficits or signs of temporal arteritis.  The patient's pain seems to come from her neck and causes pain in the left side of her head.  She states that it quality these headaches has not changed in the last several months.  She is advised to return here for any worsening in her condition.  Patient states she is not having loss of vision or changes in vision.  She does state that light seems to bother her L eye.    MDM  MDM Reviewed: nursing note and vitals Interpretation: CT scan             Carlyle Dolly, PA-C 02/25/12 1735  Carlyle Dolly, PA-C 02/25/12 1738

## 2012-02-26 NOTE — ED Provider Notes (Signed)
Medical screening examination/treatment/procedure(s) were performed by non-physician practitioner and as supervising physician I was immediately available for consultation/collaboration.  Doug Sou, MD 02/26/12 [phone]

## 2012-04-29 ENCOUNTER — Other Ambulatory Visit: Payer: Self-pay | Admitting: Neurology

## 2012-04-29 DIAGNOSIS — R51 Headache: Secondary | ICD-10-CM

## 2012-04-29 DIAGNOSIS — D329 Benign neoplasm of meninges, unspecified: Secondary | ICD-10-CM

## 2012-05-07 ENCOUNTER — Other Ambulatory Visit: Payer: Medicaid Other

## 2012-07-13 ENCOUNTER — Encounter (HOSPITAL_COMMUNITY): Payer: Self-pay | Admitting: *Deleted

## 2012-07-13 ENCOUNTER — Emergency Department (HOSPITAL_COMMUNITY): Payer: Medicaid Other

## 2012-07-13 ENCOUNTER — Emergency Department (HOSPITAL_COMMUNITY)
Admission: EM | Admit: 2012-07-13 | Discharge: 2012-07-14 | Disposition: A | Discharge status: Home / Self Care | Payer: Medicaid Other | Attending: Emergency Medicine | Admitting: Emergency Medicine

## 2012-07-13 DIAGNOSIS — R109 Unspecified abdominal pain: Secondary | ICD-10-CM | POA: Insufficient documentation

## 2012-07-13 DIAGNOSIS — Z79899 Other long term (current) drug therapy: Secondary | ICD-10-CM | POA: Insufficient documentation

## 2012-07-13 DIAGNOSIS — M549 Dorsalgia, unspecified: Secondary | ICD-10-CM | POA: Insufficient documentation

## 2012-07-13 DIAGNOSIS — I10 Essential (primary) hypertension: Secondary | ICD-10-CM | POA: Insufficient documentation

## 2012-07-13 DIAGNOSIS — R10814 Left lower quadrant abdominal tenderness: Secondary | ICD-10-CM | POA: Insufficient documentation

## 2012-07-13 DIAGNOSIS — R350 Frequency of micturition: Secondary | ICD-10-CM | POA: Insufficient documentation

## 2012-07-13 DIAGNOSIS — E119 Type 2 diabetes mellitus without complications: Secondary | ICD-10-CM | POA: Insufficient documentation

## 2012-07-13 LAB — COMPREHENSIVE METABOLIC PANEL
BUN: 12 mg/dL (ref 6–23)
CO2: 28 mEq/L (ref 19–32)
Calcium: 9.5 mg/dL (ref 8.4–10.5)
Chloride: 102 mEq/L (ref 96–112)
Creatinine, Ser: 0.55 mg/dL (ref 0.50–1.10)
GFR calc Af Amer: >90 mL/min (ref >90)
GFR calc non Af Amer: >90 mL/min (ref >90)
Glucose, Bld: 117 mg/dL — ABNORMAL HIGH (ref 70–99)
Total Bilirubin: 0.3 mg/dL (ref 0.3–1.2)

## 2012-07-13 LAB — CBC WITH DIFFERENTIAL/PLATELET
Basophils Absolute: 0 10*3/uL (ref 0.0–0.1)
Eosinophils Relative: 3 % (ref 0–5)
Lymphocytes Relative: 50 % — ABNORMAL HIGH (ref 12–46)
MCV: 87.3 fL (ref 78.0–100.0)
Neutro Abs: 2.6 10*3/uL (ref 1.7–7.7)
Neutrophils Relative %: 41 % — ABNORMAL LOW (ref 43–77)
Platelets: 258 10*3/uL (ref 150–400)
RBC: 4.49 MIL/uL (ref 3.87–5.11)
RDW: 12.9 % (ref 11.5–15.5)
WBC: 6.3 10*3/uL (ref 4.0–10.5)

## 2012-07-13 LAB — URINALYSIS, ROUTINE W REFLEX MICROSCOPIC
Bilirubin Urine: NEGATIVE
Hgb urine dipstick: NEGATIVE
Nitrite: NEGATIVE
Protein, ur: NEGATIVE mg/dL
Specific Gravity, Urine: 1.027 (ref 1.005–1.030)
Urobilinogen, UA: 0.2 mg/dL (ref 0.0–1.0)

## 2012-07-13 LAB — LIPASE, BLOOD: Lipase: 32 U/L (ref 11–59)

## 2012-07-13 MED ORDER — SODIUM CHLORIDE 0.9 % IV SOLN: 1000.0000 mL | INTRAVENOUS | Status: DC

## 2012-07-13 MED ORDER — SODIUM CHLORIDE 0.9 % IV SOLN
1000.0000 mL | Freq: Once | INTRAVENOUS | Status: AC
  Administered 2012-07-13: 1000 mL via INTRAVENOUS

## 2012-07-13 MED ORDER — HYDROMORPHONE HCL PF 1 MG/ML IJ SOLN
1.0000 mg | Freq: Once | INTRAMUSCULAR | Status: AC
  Administered 2012-07-13: 1 mg via INTRAVENOUS
  Filled 2012-07-13: qty 1

## 2012-07-13 MED ORDER — ONDANSETRON HCL 4 MG/2ML IJ SOLN
4.0000 mg | Freq: Once | INTRAMUSCULAR | Status: AC
  Administered 2012-07-13: 4 mg via INTRAVENOUS
  Filled 2012-07-13: qty 2

## 2012-07-13 MED ORDER — DIPHENHYDRAMINE HCL 50 MG/ML IJ SOLN
12.5000 mg | Freq: Four times a day (QID) | INTRAMUSCULAR | Status: DC | PRN
  Administered 2012-07-13: 12.5 mg via INTRAVENOUS
  Filled 2012-07-13: qty 1

## 2012-07-13 NOTE — ED Notes (Signed)
Patient with left lower abdominal pain that radiates to her back and down her leg.  Pain is intermittent and states that after she urinates she it hurts and has urgency as well

## 2012-07-13 NOTE — ED Provider Notes (Signed)
History     CSN: [id number]  Arrival date & time 07/13/12  6578   First MD Initiated Contact with Patient 07/13/12 2019      Chief Complaint  Patient presents with  . Abdominal Pain    (Consider location/radiation/quality/duration/timing/severity/associated sxs/prior treatment) [name] a 61 y.o. female presenting with abdominal pain. The history is provided by the patient.  Abdominal Pain The primary symptoms of the illness include abdominal pain. The primary symptoms of the illness do not include fever. The current episode started more than 2 days ago. The onset of the illness was gradual.  The abdominal pain is located in the LLQ. Pain radiation: toward leg. The abdominal pain is relieved by nothing.  Additional symptoms associated with the illness include frequency and back pain. Symptoms associated with the illness do not include chills, urgency or hematuria. Significant associated medical issues include diverticulitis.  Pt has had diverticulitis before.  This feels similar. The pain is severe at times and increases with movement.  Past Medical History  Diagnosis Date  . Hypertension   . Diabetes mellitus   . Bipolar 1 disorder   . Heart murmur     Past Surgical History  Procedure Date  . Brain surgery   . Facial tumor removal   . Brain tumor removal     History reviewed. No pertinent family history.  History  Substance Use Topics  . Smoking status: Current Some Day Smoker -- 0.5 packs/day    Types: Cigarettes  . Smokeless tobacco: Never Used  . Alcohol Use: No    OB History    Grav Para Term Preterm Abortions TAB SAB Ect Mult Living                  Review of Systems  Constitutional: Negative for fever and chills.  Gastrointestinal: Positive for abdominal pain.  Genitourinary: Positive for frequency. Negative for urgency and hematuria.  Musculoskeletal: Positive for back pain.  All other systems reviewed and are negative.    Allergies  Haldol and  Morphine and related  Home Medications   Current Outpatient Rx  Name Route Sig Dispense Refill  . ALBUTEROL SULFATE HFA 108 (90 BASE) MCG/ACT IN AERS Inhalation Inhale 2 puffs into the lungs every 6 (six) hours as needed. For shortness of breath    . ASPIRIN 81 MG PO TABS Oral Take 81 mg by mouth daily.      Marland Kitchen VITAMIN D 1000 UNITS PO TABS Oral Take 1,000 Units by mouth daily.      . DEXLANSOPRAZOLE 60 MG PO CPDR Oral Take 60 mg by mouth daily.    Marland Kitchen GABAPENTIN 300 MG PO CAPS Oral Take 300 mg by mouth 3 (three) times daily.      Marland Kitchen HYDROCODONE-ACETAMINOPHEN 10-500 MG PO TABS Oral Take 1 tablet by mouth every 6 (six) hours as needed. For pain    . METFORMIN HCL 500 MG PO TABS Oral Take 500 mg by mouth 2 (two) times daily with a meal.      . OLMESARTAN MEDOXOMIL-HCTZ 40-12.5 MG PO TABS Oral Take 1 tablet by mouth daily.      BP 147/83  Pulse 100  Temp 98.3 F (36.8 C) (Oral)  Resp 16  SpO2 99%  Physical Exam  Nursing note and vitals reviewed. Constitutional: She appears well-developed and well-nourished. No distress.  HENT:  Head: Normocephalic and atraumatic.  Right Ear: External ear normal.  Left Ear: External ear normal.  Eyes: Conjunctivae are normal. Right eye  exhibits no discharge. Left eye exhibits no discharge. No scleral icterus.  Neck: Neck supple. No tracheal deviation present.  Cardiovascular: Normal rate, regular rhythm and intact distal pulses.   Pulmonary/Chest: Effort normal and breath sounds normal. No stridor. No respiratory distress. She has no wheezes. She has no rales.  Abdominal: Soft. Bowel sounds are normal. She exhibits no distension, no fluid wave, no ascites and no mass. There is tenderness in the left lower quadrant. There is no rebound, no guarding and no CVA tenderness. No hernia.  Musculoskeletal: She exhibits no edema and no tenderness.  Neurological: She is alert. She has normal strength. No sensory deficit. Cranial nerve deficit:  no gross defecits  noted. She exhibits normal muscle tone. She displays no seizure activity. Coordination normal.  Skin: Skin is warm and dry. No rash noted.  Psychiatric: She has a normal mood and affect.    ED Course  Procedures (including critical care time)  Labs Reviewed  URINALYSIS, ROUTINE W REFLEX MICROSCOPIC - Abnormal; Notable for the following:    APPearance CLOUDY (*)     Leukocytes, UA SMALL (*)     All other components within normal limits  URINE MICROSCOPIC-ADD ON - Abnormal; Notable for the following:    Squamous Epithelial / LPF FEW (*)     All other components within normal limits  COMPREHENSIVE METABOLIC PANEL - Abnormal; Notable for the following:    Potassium 3.4 (*)     Glucose, Bld 117 (*)     All other components within normal limits  CBC WITH DIFFERENTIAL - Abnormal; Notable for the following:    Neutrophils Relative 41 (*)     Lymphocytes Relative 50 (*)     All other components within normal limits  LIPASE, BLOOD   Ct Abdomen Pelvis Wo Contrast  07/13/2012  *RADIOLOGY REPORT*  Clinical Data: Left lower quadrant abdominal pain  CT ABDOMEN AND PELVIS WITHOUT CONTRAST  Technique:  Multidetector CT imaging of the abdomen and pelvis was performed following the standard protocol without intravenous contrast.  Comparison: 04/18/2011  Findings: Linear left lung base opacity is likely scarring or atelectasis.  Heart size enlarged.  Trace pericardial fluid.  Mild distal esophageal wall thickening.  Organ abnormality/lesion detection is limited in the absence of intravenous contrast. Within this limitation, unremarkable liver, spleen, adrenal glands.  Mild prominence of the main pancreatic duct within the head and the intrapancreatic CBD, similar to prior.  Symmetric renal size.  No hydronephrosis or hydroureter.  No urinary tract calculi identified.  No bowel obstruction.  No CT evidence for colitis.  Normal appendix.  No free intraperitoneal air or fluid.  No lymphadenopathy.  Scattered  atherosclerosis of mildly ectatic aorta.  Thin-walled bladder.  Nonspecific CT appearance to the uterus and adnexa. Small fat containing inguinal hernia.  Multilevel degenerative changes of the imaged spine. No acute or aggressive appearing osseous lesion.  IMPRESSION: No hydronephrosis or urinary tract calculi identified.  No acute intra-abdominal process identified.  Mild distal esophageal wall thickening can be seen with gastroesophageal reflux disease or esophagitis.  Cardiomegaly with trace pericardial fluid versus thickening.   Original Report Authenticated By: Waneta Martins, M.D.        MDM  Pt without signs of significant abnormality on CT.  Lab tests reassuring.  Pain could be related to back pain/sciatiac .  Will dc home on pain medications.  Follow up with PCP       Celene Kras, MD 07/14/12 [phone]

## 2012-07-14 MED ORDER — ONDANSETRON 8 MG PO TBDP: 8.0000 mg | ORAL_TABLET | Freq: Three times a day (TID) | ORAL | Status: AC | PRN

## 2012-07-14 MED ORDER — HYDROCODONE-ACETAMINOPHEN 5-325 MG PO TABS: 1.0000 | ORAL_TABLET | Freq: Four times a day (QID) | ORAL | Status: AC | PRN

## 2012-07-14 NOTE — ED Notes (Signed)
MD at bedside.  EDP [name] [name] in to speak with this pt

## 2012-07-17 ENCOUNTER — Emergency Department (HOSPITAL_COMMUNITY)
Admission: EM | Admit: 2012-07-17 | Discharge: 2012-07-17 | Disposition: A | Discharge status: Home / Self Care | Payer: Medicaid Other | Attending: Emergency Medicine | Admitting: Emergency Medicine

## 2012-07-17 ENCOUNTER — Encounter (HOSPITAL_COMMUNITY): Payer: Self-pay | Admitting: *Deleted

## 2012-07-17 ENCOUNTER — Emergency Department (HOSPITAL_COMMUNITY): Payer: Medicaid Other

## 2012-07-17 DIAGNOSIS — E119 Type 2 diabetes mellitus without complications: Secondary | ICD-10-CM | POA: Insufficient documentation

## 2012-07-17 DIAGNOSIS — R1084 Generalized abdominal pain: Secondary | ICD-10-CM

## 2012-07-17 DIAGNOSIS — F17200 Nicotine dependence, unspecified, uncomplicated: Secondary | ICD-10-CM | POA: Insufficient documentation

## 2012-07-17 DIAGNOSIS — I10 Essential (primary) hypertension: Secondary | ICD-10-CM | POA: Insufficient documentation

## 2012-07-17 DIAGNOSIS — Z7982 Long term (current) use of aspirin: Secondary | ICD-10-CM | POA: Insufficient documentation

## 2012-07-17 DIAGNOSIS — F319 Bipolar disorder, unspecified: Secondary | ICD-10-CM | POA: Insufficient documentation

## 2012-07-17 LAB — COMPREHENSIVE METABOLIC PANEL
ALT: 15 U/L (ref 0–35)
AST: 22 U/L (ref 0–37)
Albumin: 3.8 g/dL (ref 3.5–5.2)
Alkaline Phosphatase: 63 U/L (ref 39–117)
BUN: 14 mg/dL (ref 6–23)
CO2: 28 mEq/L (ref 19–32)
Calcium: 9.5 mg/dL (ref 8.4–10.5)
Chloride: 102 mEq/L (ref 96–112)
Creatinine, Ser: 0.56 mg/dL (ref 0.50–1.10)
GFR calc Af Amer: >90 mL/min (ref >90)
GFR calc non Af Amer: >90 mL/min (ref >90)
Glucose, Bld: 97 mg/dL (ref 70–99)
Potassium: 3.6 mEq/L (ref 3.5–5.1)
Sodium: 137 mEq/L (ref 135–145)
Total Bilirubin: 0.5 mg/dL (ref 0.3–1.2)
Total Protein: 6.8 g/dL (ref 6.0–8.3)

## 2012-07-17 LAB — CBC WITH DIFFERENTIAL/PLATELET
Basophils Absolute: 0 10*3/uL (ref 0.0–0.1)
Basophils Relative: 0 % (ref 0–1)
Eosinophils Absolute: 0.1 10*3/uL (ref 0.0–0.7)
Eosinophils Relative: 1 % (ref 0–5)
HCT: 39.2 % (ref 36.0–46.0)
Hemoglobin: 13.1 g/dL (ref 12.0–15.0)
Lymphocytes Relative: 35 % (ref 12–46)
Lymphs Abs: 2.7 10*3/uL (ref 0.7–4.0)
MCH: 29.1 pg (ref 26.0–34.0)
MCHC: 33.4 g/dL (ref 30.0–36.0)
MCV: 87.1 fL (ref 78.0–100.0)
Monocytes Absolute: 0.6 10*3/uL (ref 0.1–1.0)
Monocytes Relative: 7 % (ref 3–12)
Neutro Abs: 4.3 10*3/uL (ref 1.7–7.7)
Neutrophils Relative %: 56 % (ref 43–77)
Platelets: 256 10*3/uL (ref 150–400)
RBC: 4.5 MIL/uL (ref 3.87–5.11)
RDW: 13 % (ref 11.5–15.5)
WBC: 7.7 10*3/uL (ref 4.0–10.5)

## 2012-07-17 LAB — URINALYSIS, ROUTINE W REFLEX MICROSCOPIC
Glucose, UA: NEGATIVE mg/dL
Hgb urine dipstick: NEGATIVE
Ketones, ur: NEGATIVE mg/dL
Nitrite: NEGATIVE
Protein, ur: NEGATIVE mg/dL
Specific Gravity, Urine: 1.028 (ref 1.005–1.030)
Urobilinogen, UA: 1 mg/dL (ref 0.0–1.0)
pH: 7 (ref 5.0–8.0)

## 2012-07-17 LAB — URINE MICROSCOPIC-ADD ON

## 2012-07-17 LAB — LIPASE, BLOOD: Lipase: 19 U/L (ref 11–59)

## 2012-07-17 MED ORDER — PROMETHAZINE HCL 25 MG PO TABS: 25.0000 mg | ORAL_TABLET | Freq: Four times a day (QID) | ORAL | Status: DC | PRN

## 2012-07-17 MED ORDER — IOHEXOL 300 MG/ML  SOLN
100.0000 mL | Freq: Once | INTRAMUSCULAR | Status: AC | PRN
  Administered 2012-07-17: 100 mL via INTRAVENOUS

## 2012-07-17 MED ORDER — ONDANSETRON HCL 4 MG/2ML IJ SOLN
4.0000 mg | Freq: Once | INTRAMUSCULAR | Status: AC
  Administered 2012-07-17: 4 mg via INTRAVENOUS
  Filled 2012-07-17: qty 2

## 2012-07-17 MED ORDER — FENTANYL CITRATE 0.05 MG/ML IJ SOLN
100.0000 ug | Freq: Once | INTRAMUSCULAR | Status: AC
  Administered 2012-07-17: 100 ug via INTRAVENOUS
  Filled 2012-07-17: qty 2

## 2012-07-17 MED ORDER — SODIUM CHLORIDE 0.9 % IV SOLN
Freq: Once | INTRAVENOUS | Status: AC
  Administered 2012-07-17: 13:00:00 via INTRAVENOUS

## 2012-07-17 MED ORDER — HYDROCODONE-ACETAMINOPHEN 5-325 MG PO TABS: 1.0000 | ORAL_TABLET | Freq: Four times a day (QID) | ORAL | Status: AC | PRN

## 2012-07-17 NOTE — ED Provider Notes (Signed)
History     CSN: [id number]  Arrival date & time 07/17/12  1143   First MD Initiated Contact with Patient 07/17/12 1202      Chief Complaint  Patient presents with  . Abdominal Pain    (Consider location/radiation/quality/duration/timing/severity/associated sxs/prior treatment) HPI Patient presents emergency department with diffuse abdominal pain that is worse over the 3 days ago.  Patient, states, that she's had some nausea, but no vomiting, and no diarrhea.  Patient denies chest pain, shortness of breath, weakness, back pain, fever, dysuria, headache, constipation, or fatigue.  Patient, states, that she has seen her primary Dr. for similar issues and he, advises she'll need to followup with a GI Dr.The patient states that nothing seems to make the pain better or worse.  Past Medical History  Diagnosis Date  . Hypertension   . Diabetes mellitus   . Bipolar 1 disorder   . Heart murmur     Past Surgical History  Procedure Date  . Brain surgery   . Facial tumor removal   . Brain tumor removal     No family history on file.  History  Substance Use Topics  . Smoking status: Current Some Day Smoker -- 0.5 packs/day    Types: Cigarettes  . Smokeless tobacco: Never Used  . Alcohol Use: No    OB History    Grav Para Term Preterm Abortions TAB SAB Ect Mult Living                  Review of Systems All other systems negative except as documented in the HPI. All pertinent positives and negatives as reviewed in the HPI.  Allergies  Haldol and Morphine and related  Home Medications   Current Outpatient Rx  Name Route Sig Dispense Refill  . ALBUTEROL SULFATE HFA 108 (90 BASE) MCG/ACT IN AERS Inhalation Inhale 2 puffs into the lungs every 6 (six) hours as needed. For shortness of breath    . ASPIRIN 81 MG PO TABS Oral Take 81 mg by mouth daily.      Marland Kitchen VITAMIN D 1000 UNITS PO TABS Oral Take 1,000 Units by mouth daily.      . DEXLANSOPRAZOLE 60 MG PO CPDR Oral Take 60 mg  by mouth daily.    Marland Kitchen GABAPENTIN 400 MG PO CAPS Oral Take 400 mg by mouth 4 (four) times daily.    Marland Kitchen HYDROCODONE-ACETAMINOPHEN 5-325 MG PO TABS Oral Take 1-2 tablets by mouth every 6 (six) hours as needed for pain. 16 tablet 0  . METFORMIN HCL 500 MG PO TABS Oral Take 500 mg by mouth 2 (two) times daily with a meal.      . OLMESARTAN MEDOXOMIL-HCTZ 40-12.5 MG PO TABS Oral Take 1 tablet by mouth daily.    Marland Kitchen ONDANSETRON 8 MG PO TBDP Oral Take 1 tablet (8 mg total) by mouth every 8 (eight) hours as needed for nausea. 20 tablet 0    BP 142/81  Pulse 88  Temp 99 F (37.2 C) (Oral)  Resp 25  SpO2 98%  Physical Exam  Constitutional: She appears well-developed.  Cardiovascular: Normal rate, regular rhythm and normal heart sounds.  Exam reveals no gallop and no friction rub.   No murmur heard. Pulmonary/Chest: Effort normal and breath sounds normal.  Abdominal: Soft. Normal appearance and bowel sounds are normal. She exhibits no distension and no mass. There is generalized tenderness. There is no rigidity, no rebound, no guarding, no tenderness at McBurney's point and negative Murphy's sign. No  hernia.      ED Course  Procedures (including critical care time)   Labs Reviewed  COMPREHENSIVE METABOLIC PANEL  CBC WITH DIFFERENTIAL  LIPASE, BLOOD  URINALYSIS, ROUTINE W REFLEX MICROSCOPIC    [name] advised her CT scan results and told that she could have an evolving process that did not show on this CT and and if there is any worsening in her condition to return here as needed.  She will be given GI followup.  She's told to follow with her primary care Dr. for a recheck.  Patient's questions were answered.  MDM    MDM Reviewed: vitals and nursing note Interpretation: labs and CT scan          Carlyle Dolly, PA-C 07/17/12 1856

## 2012-07-17 NOTE — ED Notes (Signed)
Pt reports suprapubic "pressure", states that the pain is worse when having a BM.  Pt denies any urinary sxs at this time.  Pt reports n/v which only occur in the am.  Pt reports this has been going on since Monday and is getting worse.

## 2012-07-17 NOTE — ED Notes (Signed)
Rx given x2 Pt ambulating independently w/ steady gait on d/c in no acute distress, A&Ox4. D/c instructions reviewed w/ pt - pt denies any further questions or concerns at present.   

## 2012-07-17 NOTE — ED Notes (Signed)
Patient unable to provide urine sample at this time

## 2012-07-17 NOTE — ED Notes (Signed)
PA at bedside Pt alert and oriented x4. Respirations even and unlabored, bilateral symmetrical rise and fall of chest. Skin warm and dry. In no acute distress. Denies needs.   

## 2012-07-19 NOTE — ED Provider Notes (Signed)
Medical screening examination/treatment/procedure(s) were performed by non-physician practitioner and as supervising physician I was immediately available for consultation/collaboration.  Tobin Chad, MD 07/19/12 1725

## 2012-08-01 ENCOUNTER — Other Ambulatory Visit: Payer: Self-pay | Admitting: Gastroenterology

## 2012-08-01 DIAGNOSIS — R109 Unspecified abdominal pain: Secondary | ICD-10-CM

## 2012-08-08 ENCOUNTER — Ambulatory Visit
Admission: RE | Admit: 2012-08-08 | Discharge: 2012-08-08 | Disposition: A | Discharge status: Home / Self Care | Payer: Medicaid Other | Source: Ambulatory Visit | Attending: Gastroenterology | Admitting: Gastroenterology

## 2012-08-08 DIAGNOSIS — R109 Unspecified abdominal pain: Secondary | ICD-10-CM

## 2012-08-27 ENCOUNTER — Other Ambulatory Visit: Payer: Self-pay | Admitting: Neurology

## 2012-08-27 ENCOUNTER — Ambulatory Visit
Admission: RE | Admit: 2012-08-27 | Discharge: 2012-08-27 | Disposition: A | Discharge status: Home / Self Care | Payer: Medicaid Other | Source: Ambulatory Visit | Attending: Neurology | Admitting: Neurology

## 2012-08-27 DIAGNOSIS — D329 Benign neoplasm of meninges, unspecified: Secondary | ICD-10-CM

## 2012-08-27 DIAGNOSIS — R51 Headache: Secondary | ICD-10-CM

## 2012-08-27 MED ORDER — GADOBENATE DIMEGLUMINE 529 MG/ML IV SOLN: 15.0000 mL | Freq: Once | INTRAVENOUS | Status: AC | PRN

## 2012-10-08 ENCOUNTER — Emergency Department (HOSPITAL_COMMUNITY): Payer: Medicaid Other

## 2012-10-08 ENCOUNTER — Encounter (HOSPITAL_COMMUNITY): Payer: Self-pay | Admitting: Emergency Medicine

## 2012-10-08 ENCOUNTER — Emergency Department (HOSPITAL_COMMUNITY)
Admission: EM | Admit: 2012-10-08 | Discharge: 2012-10-08 | Disposition: A | Discharge status: Home / Self Care | Payer: Medicaid Other | Attending: Emergency Medicine | Admitting: Emergency Medicine

## 2012-10-08 DIAGNOSIS — F17200 Nicotine dependence, unspecified, uncomplicated: Secondary | ICD-10-CM | POA: Insufficient documentation

## 2012-10-08 DIAGNOSIS — IMO0002 Reserved for concepts with insufficient information to code with codable children: Secondary | ICD-10-CM | POA: Insufficient documentation

## 2012-10-08 DIAGNOSIS — M5416 Radiculopathy, lumbar region: Secondary | ICD-10-CM

## 2012-10-08 DIAGNOSIS — Z7982 Long term (current) use of aspirin: Secondary | ICD-10-CM | POA: Insufficient documentation

## 2012-10-08 DIAGNOSIS — J3489 Other specified disorders of nose and nasal sinuses: Secondary | ICD-10-CM | POA: Insufficient documentation

## 2012-10-08 DIAGNOSIS — M129 Arthropathy, unspecified: Secondary | ICD-10-CM | POA: Insufficient documentation

## 2012-10-08 DIAGNOSIS — Z79899 Other long term (current) drug therapy: Secondary | ICD-10-CM | POA: Insufficient documentation

## 2012-10-08 DIAGNOSIS — F319 Bipolar disorder, unspecified: Secondary | ICD-10-CM | POA: Insufficient documentation

## 2012-10-08 DIAGNOSIS — R222 Localized swelling, mass and lump, trunk: Secondary | ICD-10-CM | POA: Insufficient documentation

## 2012-10-08 DIAGNOSIS — R918 Other nonspecific abnormal finding of lung field: Secondary | ICD-10-CM

## 2012-10-08 DIAGNOSIS — J40 Bronchitis, not specified as acute or chronic: Secondary | ICD-10-CM | POA: Insufficient documentation

## 2012-10-08 DIAGNOSIS — E119 Type 2 diabetes mellitus without complications: Secondary | ICD-10-CM | POA: Insufficient documentation

## 2012-10-08 DIAGNOSIS — Z8679 Personal history of other diseases of the circulatory system: Secondary | ICD-10-CM | POA: Insufficient documentation

## 2012-10-08 DIAGNOSIS — M549 Dorsalgia, unspecified: Secondary | ICD-10-CM | POA: Insufficient documentation

## 2012-10-08 DIAGNOSIS — I10 Essential (primary) hypertension: Secondary | ICD-10-CM | POA: Insufficient documentation

## 2012-10-08 HISTORY — DX: Unspecified osteoarthritis, unspecified site: M19.90

## 2012-10-08 HISTORY — DX: Sciatica, unspecified side: M54.30

## 2012-10-08 LAB — URINALYSIS, ROUTINE W REFLEX MICROSCOPIC
Leukocytes, UA: NEGATIVE
Nitrite: NEGATIVE
Specific Gravity, Urine: 1.014 (ref 1.005–1.030)
pH: 7.5 (ref 5.0–8.0)

## 2012-10-08 MED ORDER — GUAIFENESIN 100 MG/5ML PO LIQD: 100.0000 mg | ORAL | Status: DC | PRN

## 2012-10-08 MED ORDER — OXYCODONE-ACETAMINOPHEN 5-325 MG PO TABS: 1.0000 | ORAL_TABLET | ORAL | Status: DC | PRN

## 2012-10-08 MED ORDER — PREDNISONE 20 MG PO TABS
60.0000 mg | ORAL_TABLET | Freq: Once | ORAL | Status: AC
  Administered 2012-10-08: 60 mg via ORAL
  Filled 2012-10-08: qty 3

## 2012-10-08 MED ORDER — PREDNISONE 10 MG PO TABS: ORAL_TABLET | ORAL | Status: DC

## 2012-10-08 MED ORDER — HYDROMORPHONE HCL PF 1 MG/ML IJ SOLN
1.0000 mg | Freq: Once | INTRAMUSCULAR | Status: AC
  Administered 2012-10-08: 1 mg via INTRAMUSCULAR
  Filled 2012-10-08: qty 1

## 2012-10-08 NOTE — ED Notes (Signed)
Pt c/o pain d/t sciatica that she has had for 2 months on her left hip.  Pt also c/o cough x 4 days.

## 2012-10-08 NOTE — ED Provider Notes (Signed)
History     CSN: [id number]  Arrival date & time 10/08/12  1191   First MD Initiated Contact with Patient 10/08/12 0932      Chief Complaint  Patient presents with  . Sciatica  . Cough    (Consider location/radiation/quality/duration/timing/severity/associated sxs/prior treatment) HPI [name] [name] is a 61 y.o. female who presents to ED with complaint of pain in the left lower back radiating into left leg. States pain has been there for over 2 months. Has been seen by her doctor, taking neurontin, lortab, has been getting toradol shots, all not helping. State has an appointment with orthopedist in 6 days. States " I need to know what is going on." also states having cough, congestion, coughing up green sputum. States symptoms for about 4 days. Denies fever, chills, malaise. Taking albuterol with no improvement. No chest pain. No LE swelling.   Past Medical History  Diagnosis Date  . Hypertension   . Diabetes mellitus   . Bipolar 1 disorder   . Heart murmur   . Sciatica   . Arthritis     Past Surgical History  Procedure Date  . Brain surgery   . Facial tumor removal   . Brain tumor removal     History reviewed. No pertinent family history.  History  Substance Use Topics  . Smoking status: Current Some Day Smoker -- 0.5 packs/day    Types: Cigarettes  . Smokeless tobacco: Never Used  . Alcohol Use: No    OB History    Grav Para Term Preterm Abortions TAB SAB Ect Mult Living                  Review of Systems  Constitutional: Negative for fever and chills.  HENT: Positive for congestion. Negative for sore throat, neck pain, neck stiffness and sinus pressure.   Respiratory: Positive for cough. Negative for choking, chest tightness, shortness of breath and wheezing.   Cardiovascular: Negative.   Gastrointestinal: Negative.   Genitourinary: Negative for dysuria and flank pain.  Musculoskeletal: Positive for back pain.  Skin: Negative.   Neurological: Negative  for dizziness, weakness, numbness and headaches.  Psychiatric/Behavioral: Negative.     Allergies  Haldol; Morphine and related; and Penicillins  Home Medications   Current Outpatient Rx  Name  Route  Sig  Dispense  Refill  . ALBUTEROL SULFATE HFA 108 (90 BASE) MCG/ACT IN AERS   Inhalation   Inhale 2 puffs into the lungs every 6 (six) hours as needed. For shortness of breath         . ASPIRIN 81 MG PO TABS   Oral   Take 81 mg by mouth daily.           Marland Kitchen VITAMIN D 1000 UNITS PO TABS   Oral   Take 1,000 Units by mouth once a week.          . DEXLANSOPRAZOLE 60 MG PO CPDR   Oral   Take 60 mg by mouth daily.         Marland Kitchen GABAPENTIN 400 MG PO CAPS   Oral   Take 400 mg by mouth 3 (three) times daily.          Marland Kitchen HYDROCODONE-ACETAMINOPHEN 10-500 MG PO TABS   Oral   Take 1 tablet by mouth every 6 (six) hours as needed. Pain         . METFORMIN HCL 500 MG PO TABS   Oral   Take 500 mg by mouth 2 (two)  times daily with a meal.          . OLMESARTAN MEDOXOMIL-HCTZ 40-12.5 MG PO TABS   Oral   Take 1 tablet by mouth daily.           BP 127/95  Pulse 107  Temp 98.2 F (36.8 C) (Oral)  Resp 18  SpO2 96%  Physical Exam  Nursing note and vitals reviewed. Constitutional: She is oriented to person, place, and time. She appears well-developed and well-nourished.  HENT:  Head: Normocephalic.  Eyes: Conjunctivae normal are normal.  Cardiovascular: Normal rate, regular rhythm and normal heart sounds.   Pulmonary/Chest: Effort normal. She has wheezes.  Abdominal: Soft. Bowel sounds are normal. She exhibits no distension. There is no tenderness. There is no rebound.  Musculoskeletal:       Tender in left SI and left buttock. Pain with left straight leg raise. 5/5 and equal LE strength bilaterally. Pt able to dorsiflex bilateral feet and great toes. Normal dorsal pedal pulses  Neurological: She is alert and oriented to person, place, and time.  Skin: Skin is warm and  dry.  Psychiatric: She has a normal mood and affect.    ED Course  Procedures (including critical care time)  Pt with left sided radiculopathy, as well as URI symptoms. Pt neurologically intact, no signs of cauda equina. No problems with bowels or urine. Pt is coughing, will get CXR, try pain meds. Pt does have a follow up with orthpedics on Monday (6 days).   Results for orders placed during the hospital encounter of 10/08/12  URINALYSIS, ROUTINE W REFLEX MICROSCOPIC      Component Value Range   Color, Urine YELLOW  YELLOW   APPearance CLEAR  CLEAR   Specific Gravity, Urine 1.014  1.005 - 1.030   pH 7.5  5.0 - 8.0   Glucose, UA NEGATIVE  NEGATIVE mg/dL   Hgb urine dipstick NEGATIVE  NEGATIVE   Bilirubin Urine NEGATIVE  NEGATIVE   Ketones, ur NEGATIVE  NEGATIVE mg/dL   Protein, ur NEGATIVE  NEGATIVE mg/dL   Urobilinogen, UA 1.0  0.0 - 1.0 mg/dL   Nitrite NEGATIVE  NEGATIVE   Leukocytes, UA NEGATIVE  NEGATIVE   Dg Chest 2 View  10/08/2012  *RADIOLOGY REPORT*  Clinical Data: Productive cough.  CHEST - 2 VIEW  Comparison: Chest x-ray 04/25/2011.  Findings: In the right upper lobe there is a 2.1 x 2.8 cm nodule that is has increased in size compared to the prior study 04/25/2011.  This is highly concerning for right upper lobe neoplasm. Lungs otherwise appear clear.  Specifically, no definite consolidative airspace disease.  No pleural effusions.  Slight prominence of right hilar soft tissues is noted.  Mediastinal contours are otherwise within normal limits.  Heart size is normal. Atherosclerosis of the thoracic aorta.  IMPRESSION: 1.  2.1 x 2.8 cm nodular opacity in the right upper lobe has increased compared to the remote prior study 04/25/2011 and is highly concerning for a right upper lobe neoplasm.  Further evaluation with chest CT is highly recommended at this time. 2.  No radiographic evidence of acute cardiopulmonary disease is otherwise noted. 3.  Atherosclerosis.  These results were  called by telephone on 10/08/2012 at 11:28 a.m. to PA Jimeka Balan, who verbally acknowledged these results.   Original Report Authenticated By: Trudie Reed, M.D.     Spoke with Radiologist regarding a lung mass. Recommended CT. Pt states she was never told that she had a nodule on last x-ray  in June of 2012, however, discharge papers from that visit, clearly describe the nodule and needed follow up. Will gt CT here.    Ct Chest Wo Contrast  10/08/2012  *RADIOLOGY REPORT*  Clinical Data: Enlarging lung nodule noted on recent chest x-ray.  CT CHEST WITHOUT CONTRAST  Technique:  Multidetector CT imaging of the chest was performed following the standard protocol without IV contrast.  Comparison: Chest x-ray 10/08/2012.  Findings:  Mediastinum: Heart size is borderline enlarged. Trace amount of anterior pericardial fluid and/or thickening, unlikely to be of any hemodynamic significance at this time.  No associated pericardial calcification.  No definite pathologically enlarged mediastinal or hilar lymph nodes are appreciated. Please note that accurate exclusion of hilar adenopathy is limited on noncontrast CT scans. Esophagus is unremarkable in appearance.  Atherosclerosis of the thoracic aorta.  Lungs/Pleura: In the right upper lobe there is a macrolobulated nodule that measures approximately 2.2 x 2.0 x 2.1 cm.  There is a background of mild diffuse bronchial wall thickening with mild centrilobular emphysema.  No acute consolidative airspace disease. No pleural effusions.  Upper Abdomen: Unremarkable.  Musculoskeletal: There are no aggressive appearing lytic or blastic lesions noted in the visualized portions of the skeleton.  IMPRESSION: 1.  2.2 x 2.0 x 2.1 cm macrolobulated right upper lobe pulmonary nodule is highly suspicious for primary lung malignancy.  No definite mediastinal or hilar lymphadenopathy is appreciated at this time.  Correlation with PET CT and/or biopsy is recommended. If  multidisciplinary follow up management is desired, this is available in the [facility] through the [facility] [phone]. 2. Mild diffuse bronchial wall thickening with mild centrilobular emphysema; imaging findings compatible with underlying COPD. 3.  Mild atherosclerosis.   Original Report Authenticated By: Trudie Reed, M.D.     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 Pt instructed she will need close follow up, pt voiced understanding. Steroids for back pain and bronchitis. Pain medications. Close follow up.       1. Bronchitis   2. Lumbar radiculopathy   3. Lung mass       MDM  Pt with 2 month hx of back pain. No signs of cauda equina. Neurovascularly intact. Also cough for 4 days. CXR and CT negative for pneumonia, showing chronic lung disease which will be treated with inhaler and steroids. Also seen possible malignant mass in right lung. This was in depth discussed with pt and she was instructed to follow up within a week. Pt voiced understanding and was going to call Dr. Mayford Knife today. Pt stable for d/c home.   Filed Vitals:   10/08/12 1147  BP: 138/75  Pulse: 72  Temp: 98.2 F (36.8 C)  Resp: [address] [place], [place] 10/08/12 1638

## 2012-10-09 NOTE — ED Provider Notes (Signed)
Medical screening examination/treatment/procedure(s) were performed by non-physician practitioner and as supervising physician I was immediately available for consultation/collaboration.  Larnie Heart, MD 10/09/12 0712 

## 2012-10-11 ENCOUNTER — Encounter: Payer: Self-pay | Admitting: *Deleted

## 2012-10-11 NOTE — Progress Notes (Signed)
Received referral today form Dr. Mayford Knife.  I emailed Dr. Delton Coombes regarding tissue dx.

## 2012-10-15 ENCOUNTER — Telehealth: Payer: Self-pay | Admitting: *Deleted

## 2012-10-15 NOTE — Telephone Encounter (Signed)
Spoke with pt regarding appt time and place.  She verbalized understanding of appt 

## 2012-10-21 ENCOUNTER — Telehealth: Payer: Self-pay | Admitting: *Deleted

## 2012-10-21 NOTE — Telephone Encounter (Signed)
Pt came to [facility] today with daughter.  She stated she had an appt to see Dr. Arbutus Ped.  After my meeting I went to find pt.  Pt had left so I called.  Ms. Harned was very upset and yelling on the phone regarding not being seen by Dr. Arbutus Ped.  I had called last week to give her an appt with Dr. Delton Coombes for tissue dx.  Pt has lung mass but no tissue dx.  The appt is 11/15/12 at Dr. Kavin Leech office.  She stated someone from the cancer center had called her with appt today with Dr. Arbutus Ped.  The daughter also got on the phone to yell about pt not being seen today.  She spoke about not having time to take off work for another appt.  She also spoke about having to pick up child form school.  I let pt and daughter vent their frustrations. I notified Dr. Arbutus Ped regarding issue and will email Dr. Delton Coombes to see if pt can be seen earlier.

## 2012-10-25 ENCOUNTER — Telehealth: Payer: Self-pay | Admitting: Emergency Medicine

## 2012-10-25 NOTE — Telephone Encounter (Signed)
Patient would like to reschedule appt from 11/15/12 to an earlier date.  Would have like to changed to 11/11/12, however this does not fit into Dr. Neville Route schedule.  Per Dr. Delton Coombes, patient can keep same appt time and date or if she would like can see another MD.  ATC pt to inform of this, no answer.  LMOMTCB

## 2012-10-30 ENCOUNTER — Telehealth: Payer: Self-pay | Admitting: Emergency Medicine

## 2012-10-30 NOTE — Telephone Encounter (Signed)
Spoke with patient, patient aware that since oncology needs consult earlier and Dr. Delton Coombes has no opening she will be placed on Dr. Teddy Spike schedule as rec by RB.  Appt switched to 12/19 @ 1145am.  Patient verbalized understanding and nothing further needed at this time.

## 2012-10-30 NOTE — Telephone Encounter (Signed)
Please set this lady up with a consult with another provider - Oncology needed her to be seen before my existing appointment on 12/27. Probably Clance or Vassie Loll preferred since she may need procedure.

## 2012-11-07 ENCOUNTER — Encounter: Payer: Self-pay | Admitting: Pulmonary Disease

## 2012-11-07 ENCOUNTER — Ambulatory Visit (INDEPENDENT_AMBULATORY_CARE_PROVIDER_SITE_OTHER): Payer: Medicaid Other | Admitting: Pulmonary Disease

## 2012-11-07 VITALS — BP 140/90 | HR 89 | Temp 98.4°F | Ht 64.0 in | Wt 158.8 lb

## 2012-11-07 DIAGNOSIS — R918 Other nonspecific abnormal finding of lung field: Secondary | ICD-10-CM

## 2012-11-07 DIAGNOSIS — C3410 Malignant neoplasm of upper lobe, unspecified bronchus or lung: Secondary | ICD-10-CM | POA: Insufficient documentation

## 2012-11-07 DIAGNOSIS — R222 Localized swelling, mass and lump, trunk: Secondary | ICD-10-CM

## 2012-11-07 NOTE — Progress Notes (Signed)
  Subjective:    Patient ID: [name] [name], female    DOB: 1951-09-20, 61 y.o.   MRN: [id number]  HPI The patient is a very pleasant 61 year old female who I've been asked to see for an abnormal chest x-ray.  She recently developed left hip pain, and presented to the emergency room where a chest x-ray showed a growing right upper lobe nodule.  This has nearly doubled in size from last year.  She subsequently underwent a CT chest which showed a 22 mm nodule in the right upper lobe, but no evidence for mediastinal or hilar lymphadenopathy.  The patient has a long history of tobacco abuse, and has had an intermittent cough but no hemoptysis.  She denies anorexia, and states that her weight has been stable.  She denies any right-sided chest pain or headaches.   Review of Systems  Constitutional: Negative for fever and unexpected weight change.  HENT: Positive for congestion, rhinorrhea and postnasal drip. Negative for ear pain, nosebleeds, sore throat, sneezing, trouble swallowing, dental problem and sinus pressure.   Eyes: Negative for redness and itching.  Respiratory: Positive for cough and wheezing. Negative for chest tightness and shortness of breath.   Cardiovascular: Negative for palpitations and leg swelling.  Gastrointestinal: Negative for nausea and vomiting.  Genitourinary: Negative for dysuria.  Musculoskeletal: Positive for arthralgias ( hip). Negative for joint swelling.  Skin: Negative for rash.  Neurological: Negative for headaches.  Hematological: Does not bruise/bleed easily.  Psychiatric/Behavioral: Negative for dysphoric mood. The patient is nervous/anxious.        Objective:   Physical Exam Constitutional:  Well developed, no acute distress  HENT:  Nares patent without discharge  Oropharynx without exudate, palate and uvula are normal  Eyes:  Perrla, eomi, no scleral icterus  Neck:  No JVD, no TMG  Cardiovascular:  Normal rate, regular rhythm, no rubs or gallops.   No murmurs        Intact distal pulses  Pulmonary :  Mildly decreased breath sounds, no stridor or respiratory distress   No rales, rhonchi, or wheezing  Abdominal:  Soft, nondistended, bowel sounds present.  No tenderness noted.   Musculoskeletal:  No lower extremity edema noted.  Lymph Nodes:  No cervical lymphadenopathy noted  Skin:  No cyanosis noted  Neurologic:  Alert, appropriate, moves all 4 extremities without obvious deficit.         Assessment & Plan:

## 2012-11-07 NOTE — Patient Instructions (Addendum)
Will schedule for breathing tests to evaluate your lung capacity. Will schedule for PET scan to evaluate your lung spot, and see if anywhere else in the body. Will call you once I have test results.

## 2012-11-07 NOTE — Assessment & Plan Note (Signed)
The patient has an enlarging right upper lobe nodule/mass compared to last year, and with her long smoking history, this is most consistent with bronchogenic cancer.  She will need a PET scan for staging, as well as full pulmonary function studies to establish her candidacy for possible resection.  I have had a long discussion with her about all of this, and answered her questions.  We will arrange followup once test results are available.

## 2012-11-08 ENCOUNTER — Encounter (HOSPITAL_COMMUNITY): Payer: Self-pay | Admitting: Emergency Medicine

## 2012-11-08 ENCOUNTER — Emergency Department (HOSPITAL_COMMUNITY)
Admission: EM | Admit: 2012-11-08 | Discharge: 2012-11-08 | Disposition: A | Discharge status: Home / Self Care | Payer: Medicaid Other | Attending: Emergency Medicine | Admitting: Emergency Medicine

## 2012-11-08 ENCOUNTER — Emergency Department (HOSPITAL_COMMUNITY): Payer: Medicaid Other

## 2012-11-08 ENCOUNTER — Ambulatory Visit (INDEPENDENT_AMBULATORY_CARE_PROVIDER_SITE_OTHER): Payer: Medicaid Other | Admitting: Pulmonary Disease

## 2012-11-08 DIAGNOSIS — M79609 Pain in unspecified limb: Secondary | ICD-10-CM | POA: Insufficient documentation

## 2012-11-08 DIAGNOSIS — R222 Localized swelling, mass and lump, trunk: Secondary | ICD-10-CM

## 2012-11-08 DIAGNOSIS — F17200 Nicotine dependence, unspecified, uncomplicated: Secondary | ICD-10-CM | POA: Insufficient documentation

## 2012-11-08 DIAGNOSIS — M5432 Sciatica, left side: Secondary | ICD-10-CM

## 2012-11-08 DIAGNOSIS — F319 Bipolar disorder, unspecified: Secondary | ICD-10-CM | POA: Insufficient documentation

## 2012-11-08 DIAGNOSIS — M25559 Pain in unspecified hip: Secondary | ICD-10-CM | POA: Insufficient documentation

## 2012-11-08 DIAGNOSIS — Z79899 Other long term (current) drug therapy: Secondary | ICD-10-CM | POA: Insufficient documentation

## 2012-11-08 DIAGNOSIS — E119 Type 2 diabetes mellitus without complications: Secondary | ICD-10-CM | POA: Insufficient documentation

## 2012-11-08 DIAGNOSIS — R918 Other nonspecific abnormal finding of lung field: Secondary | ICD-10-CM

## 2012-11-08 DIAGNOSIS — M5430 Sciatica, unspecified side: Secondary | ICD-10-CM | POA: Insufficient documentation

## 2012-11-08 DIAGNOSIS — I10 Essential (primary) hypertension: Secondary | ICD-10-CM | POA: Insufficient documentation

## 2012-11-08 DIAGNOSIS — Z8739 Personal history of other diseases of the musculoskeletal system and connective tissue: Secondary | ICD-10-CM | POA: Insufficient documentation

## 2012-11-08 DIAGNOSIS — Z7982 Long term (current) use of aspirin: Secondary | ICD-10-CM | POA: Insufficient documentation

## 2012-11-08 DIAGNOSIS — R011 Cardiac murmur, unspecified: Secondary | ICD-10-CM | POA: Insufficient documentation

## 2012-11-08 LAB — PULMONARY FUNCTION TEST

## 2012-11-08 MED ORDER — HYDROMORPHONE HCL PF 1 MG/ML IJ SOLN
0.5000 mg | Freq: Once | INTRAMUSCULAR | Status: AC
  Administered 2012-11-08: 0.5 mg via INTRAMUSCULAR
  Filled 2012-11-08: qty 1

## 2012-11-08 MED ORDER — OXYCODONE-ACETAMINOPHEN 5-325 MG PO TABS: 1.0000 | ORAL_TABLET | Freq: Four times a day (QID) | ORAL | Status: DC | PRN

## 2012-11-08 MED ORDER — PREDNISONE 20 MG PO TABS: 40.0000 mg | ORAL_TABLET | Freq: Every day | ORAL | Status: DC

## 2012-11-08 NOTE — Progress Notes (Signed)
PFT done today. 

## 2012-11-08 NOTE — ED Provider Notes (Signed)
History     CSN: [id number]  Arrival date & time 11/08/12  1104   First MD Initiated Contact with Patient 11/08/12 1144      Chief Complaint  Patient presents with  . Hip Pain  . Leg Pain  . Back Pain    (Consider location/radiation/quality/duration/timing/severity/associated sxs/prior treatment) HPI  She presents the emergency department with complaints of left hip, leg and low back pain since being seen a month ago in November. On November 19 she came for leg pain and also complained of cough therefore the chest x-ray was done and noticed a lung mass was noted. She has been going to her followup appointments with for this and seeing Dr. Ave Filter for her shoullder pains but she said no one has evaluated her leg pain yet. The pain shoots down towards her knee. She asks for Dilaudid specifically. nad vss  Past Medical History  Diagnosis Date  . Hypertension   . Diabetes mellitus   . Bipolar 1 disorder   . Heart murmur   . Sciatica   . Arthritis     Past Surgical History  Procedure Date  . Brain surgery   . Facial tumor removal   . Brain tumor removal     Family History  Problem Relation Age of Onset  . Hypertension Father     deceased  . Diabetes Father   . Heart disease Father   . Hyperlipidemia Father   . Hypertension Mother   . Hyperlipidemia Mother   . Diabetes Mother   . Hypertension Sister   . Hyperlipidemia Sister   . Hypertension Brother   . Hyperlipidemia Brother     History  Substance Use Topics  . Smoking status: Current Some Day Smoker -- 10 years    Types: Cigarettes  . Smokeless tobacco: Never Used     Comment: Smokes 1/2 pack per week  . Alcohol Use: No    OB History    Grav Para Term Preterm Abortions TAB SAB Ect Mult Living                  Review of Systems  Review of Systems  Gen: no weight loss, fevers, chills, night sweats  Lungs:No wheezing, coughing or hemoptysis CV: no chest pain, palpitations, dependent edema or  orthopnea  Abd: no abdominal pain, nausea, vomiting  GU: no dysuria or gross hematuria  MSK:  Left hip pain Neuro: no headache, no focal neurologic deficits  Skin: no abnormalities Psyche: negative.   Allergies  Haldol; Morphine and related; and Penicillins  Home Medications   Current Outpatient Rx  Name  Route  Sig  Dispense  Refill  . ALBUTEROL SULFATE HFA 108 (90 BASE) MCG/ACT IN AERS   Inhalation   Inhale 2 puffs into the lungs every 6 (six) hours as needed. For shortness of breath         . ASPIRIN 81 MG PO TABS   Oral   Take 81 mg by mouth daily.           Marland Kitchen VITAMIN D 1000 UNITS PO TABS   Oral   Take 1,000 Units by mouth once a week.          . DEXLANSOPRAZOLE 60 MG PO CPDR   Oral   Take 60 mg by mouth daily.         Marland Kitchen GABAPENTIN 400 MG PO CAPS   Oral   Take 400 mg by mouth 3 (three) times daily.          Marland Kitchen  GUAIFENESIN 100 MG/5ML PO LIQD   Oral   Take 5-10 mLs (100-200 mg total) by mouth every 4 (four) hours as needed for cough.   60 mL   0   . HYDROCODONE-ACETAMINOPHEN 10-500 MG PO TABS   Oral   Take 1 tablet by mouth every 6 (six) hours as needed. Pain         . METFORMIN HCL 500 MG PO TABS   Oral   Take 500 mg by mouth 2 (two) times daily with a meal.          . OLMESARTAN MEDOXOMIL-HCTZ 40-12.5 MG PO TABS   Oral   Take 1 tablet by mouth daily.         . OXYCODONE-ACETAMINOPHEN 5-325 MG PO TABS   Oral   Take 1 tablet by mouth every 6 (six) hours as needed for pain.   15 tablet   0   . PREDNISONE 20 MG PO TABS   Oral   Take 2 tablets (40 mg total) by mouth daily.   6 tablet   0     Monitor sugar VERY closely     BP 171/90  Pulse 98  Temp 98.6 F (37 C) (Oral)  Resp 16  SpO2 95%  Physical Exam  Nursing note and vitals reviewed. Constitutional: She appears well-developed and well-nourished. No distress.  HENT:  Head: Normocephalic and atraumatic.  Eyes: Pupils are equal, round, and reactive to light.  Neck:  Normal range of motion. Neck supple.  Cardiovascular: Normal rate and regular rhythm.   Pulmonary/Chest: Effort normal.  Abdominal: Soft.  Musculoskeletal:       Back:        Equal strength to bilateral lower extremities. Neurosensory function adequate to both legs. Skin color is normal. Skin is warm and moist. I see no step off deformity, no bony tenderness. Pt is able to ambulate without limp. Pain is relieved when sitting in certain positions. ROM is decreased due to pain. No crepitus, laceration, effusion, swelling.  Pulses are normal   Neurological: She is alert.  Skin: Skin is warm and dry.    ED Course  Procedures (including critical care time)  Labs Reviewed - No data to display Dg Hip Complete Left  11/08/2012  *RADIOLOGY REPORT*  Clinical Data: Left hip, leg, and back pain  LEFT HIP - COMPLETE 2+ VIEW  Comparison: None  Findings: Osseous demineralization. Symmetric preserved hip and SI joints. No acute fracture, dislocation, or bone destruction.  IMPRESSION: No acute osseous abnormalities.   Original Report Authenticated By: Ulyses Southward, M.D.      1. Sciatica of left side       MDM  Pt only has pain with ROM of her left hip. Xrays are normal. She gets a PET scan in a few days to evaluate for possible cancer in her lungs.   She specifically requested Dilaudid. I gave 0.5mg  IM Dilaudid in ER.    Patient with back pain. No neurological deficits. [name] ambulatory. No warning symptoms of back pain including: loss of bowel or bladder control, night sweats, waking from sleep with back pain, unexplained fevers or weight loss, h/o cancer, IVDU, recent trauma. No concern for cauda equina, epidural abscess, or other serious cause of back pain. Conservative measures such as rest, ice/heat and pain medicine indicated with PCP follow-up if no improvement with conservative management.           Dorthula Matas, PA 11/08/12 1402

## 2012-11-08 NOTE — ED Notes (Signed)
Patient transported to X-ray 

## 2012-11-08 NOTE — ED Notes (Addendum)
Patient c/o left hip, leg, and lower back pain since Tuesday.  No injury noted.

## 2012-11-08 NOTE — ED Provider Notes (Signed)
Medical screening examination/treatment/procedure(s) were performed by non-physician practitioner and as supervising physician I was immediately available for consultation/collaboration.  Flint Melter, MD 11/08/12 1700

## 2012-11-11 ENCOUNTER — Telehealth: Payer: Self-pay | Admitting: Pulmonary Disease

## 2012-11-11 NOTE — Telephone Encounter (Signed)
Please let pt know that her breathing studies show fairly normal lung function.  Will call her once PET done.

## 2012-11-12 NOTE — Telephone Encounter (Signed)
Pt aware of PFT results and is scheduled for PET scan on 11/15/12. Pt verbalized understanding.

## 2012-11-15 ENCOUNTER — Encounter (HOSPITAL_COMMUNITY)
Admission: RE | Admit: 2012-11-15 | Discharge: 2012-11-15 | Disposition: A | Discharge status: Home / Self Care | Payer: Medicaid Other | Source: Ambulatory Visit | Attending: Pulmonary Disease | Admitting: Pulmonary Disease

## 2012-11-15 ENCOUNTER — Telehealth: Payer: Self-pay | Admitting: Pulmonary Disease

## 2012-11-15 ENCOUNTER — Institutional Professional Consult (permissible substitution): Payer: Medicaid Other | Admitting: Emergency Medicine

## 2012-11-15 DIAGNOSIS — R222 Localized swelling, mass and lump, trunk: Secondary | ICD-10-CM | POA: Insufficient documentation

## 2012-11-15 DIAGNOSIS — R918 Other nonspecific abnormal finding of lung field: Secondary | ICD-10-CM

## 2012-11-15 LAB — GLUCOSE, CAPILLARY: Glucose-Capillary: 97 mg/dL (ref 70–99)

## 2012-11-15 NOTE — Telephone Encounter (Signed)
Called [facility] and resched to 11/21/12 arrive @ 8:30am, left msg for pt to call to give appt info .Kandice Hams

## 2012-11-15 NOTE — Telephone Encounter (Signed)
Spoke with pt and she is aware of pet scan for 11/21/12 8:30am @ [facility] .[name] [name]

## 2012-11-21 ENCOUNTER — Encounter (HOSPITAL_COMMUNITY)
Admission: RE | Admit: 2012-11-21 | Discharge: 2012-11-21 | Disposition: A | Discharge status: Home / Self Care | Payer: Medicaid Other | Source: Ambulatory Visit | Attending: Pulmonary Disease | Admitting: Pulmonary Disease

## 2012-11-21 ENCOUNTER — Encounter (HOSPITAL_COMMUNITY): Payer: Self-pay

## 2012-11-21 ENCOUNTER — Other Ambulatory Visit: Payer: Self-pay | Admitting: Pulmonary Disease

## 2012-11-21 DIAGNOSIS — R918 Other nonspecific abnormal finding of lung field: Secondary | ICD-10-CM

## 2012-11-21 DIAGNOSIS — R222 Localized swelling, mass and lump, trunk: Secondary | ICD-10-CM | POA: Insufficient documentation

## 2012-11-21 LAB — GLUCOSE, CAPILLARY: Glucose-Capillary: 108 mg/dL — ABNORMAL HIGH (ref 70–99)

## 2012-11-21 MED ORDER — FLUDEOXYGLUCOSE F - 18 (FDG) INJECTION
18.2000 | Freq: Once | INTRAVENOUS | Status: AC | PRN
  Administered 2012-11-21: 18.2 via INTRAVENOUS

## 2012-11-28 ENCOUNTER — Encounter: Payer: Self-pay | Admitting: Cardiothoracic Surgery

## 2012-11-28 ENCOUNTER — Encounter: Payer: Self-pay | Admitting: Pulmonary Disease

## 2012-11-28 ENCOUNTER — Institutional Professional Consult (permissible substitution) (INDEPENDENT_AMBULATORY_CARE_PROVIDER_SITE_OTHER): Payer: Medicaid Other | Admitting: Cardiothoracic Surgery

## 2012-11-28 VITALS — BP 112/71 | HR 97 | Resp 18 | Ht 64.0 in | Wt 158.0 lb

## 2012-11-28 DIAGNOSIS — R918 Other nonspecific abnormal finding of lung field: Secondary | ICD-10-CM

## 2012-11-28 DIAGNOSIS — R222 Localized swelling, mass and lump, trunk: Secondary | ICD-10-CM

## 2012-11-28 NOTE — Progress Notes (Signed)
[address]            [place] [address]          [phone]      Tarika [facility]ckethan [facility] Medical Record #[id number] Date of Birth: 1951-04-15  Referring: [name], MD Primary Care: [name], MD  Chief Complaint:    Chief Complaint  Patient presents with  . Lung Mass    Referral from Dr Shelle Iron for surgical eval on Lung Mass, PFT'S 11/08/12, PET Scan 11/21/12    History of Present Illness:          Current Activity/ Functional Status: [name] independent with mobility/ambulation, transfers, ADL's, IADL's.   Past Medical History  Diagnosis Date  . Hypertension   . Diabetes mellitus   . Bipolar 1 disorder   . Heart murmur   . Sciatica   . Arthritis    Past Surgical History  Procedure Date  . Brain surgery  meningioma resected in [facility] April 2009   . Facial tumor removal   . Brain tumor removal      Family History  Problem Relation Age of Onset  . Hypertension Father     deceased  . Diabetes Father   . Heart disease Father   . Hyperlipidemia Father   . Hypertension Mother   . Hyperlipidemia Mother   . Diabetes Mother   . Hypertension Sister   . Hyperlipidemia Sister   . Hypertension Brother   . Hyperlipidemia Brother     History   Social History  . Marital Status: Widowed    Spouse Name: N/A    Number of Children: N/A  . Years of Education: N/A   Occupational History  . n/a     patient draws SNN/SSI   Social History Main Topics  . Smoking status:  patient continues to smoke daily and has since 1980 use a third to half a pack per day     Types: Cigarettes  . Smokeless tobacco: Never Used     Comment: Smokes 1/2 pack per week  . Alcohol Use: No  . Drug Use: No  . Sexually Active: Not on file                 History  Smoking status  . Current Some Day Smoker -- 10 years  . Types: Cigarettes  Smokeless tobacco  . Never Used    Comment: Smokes 1/2 pack per week     History  Alcohol Use No     Allergies  Allergen Reactions  . Haldol (Haloperidol Decanoate) Other (See Comments)    tongue sweeling  . Morphine And Related Hives    hives  . Penicillins Rash    Current Outpatient Prescriptions  Medication Sig Dispense Refill  . albuterol (PROVENTIL HFA;VENTOLIN HFA) 108 (90 BASE) MCG/ACT inhaler Inhale 2 puffs into the lungs every 6 (six) hours as needed. For shortness of breath      . aspirin 81 MG tablet Take 81 mg by mouth daily.        . cholecalciferol (VITAMIN D) 1000 UNITS tablet Take 1,000 Units by mouth once a week.       Marland Kitchen dexlansoprazole (DEXILANT) 60 MG capsule Take 60 mg by mouth daily.      Marland Kitchen gabapentin (NEURONTIN) 400 MG capsule Take 400 mg by mouth 3 (three) times daily.       Marland Kitchen  guaiFENesin (ROBITUSSIN) 100 MG/5ML liquid Take 5-10 mLs (100-200 mg total) by mouth every 4 (four) hours as needed for cough.  60 mL  0  . HYDROcodone-acetaminophen (LORTAB) 10-500 MG per tablet Take 1 tablet by mouth every 6 (six) hours as needed. Pain      . metFORMIN (GLUCOPHAGE) 500 MG tablet Take 500 mg by mouth 2 (two) times daily with a meal.       . olmesartan-hydrochlorothiazide (BENICAR HCT) 40-12.5 MG per tablet Take 1 tablet by mouth daily.           Review of Systems:     Cardiac Review of Systems: Y or N  Chest Pain [  n  ]  Resting SOB [ n  ] Exertional SOB  [n  ]  Orthopnea [ n ]   Pedal Edema [n   ]    Palpitations [ y ] Syncope  [n  ]   Presyncope [n   ]  General Review of Systems: [Y] = yes [  ]=no Constitional: recent weight change [  ]; anorexia [  ]; fatigue [  ]; nausea [  ]; night sweats [  ]; fever [  ]; or chills [  ];                                                                                                                                          Dental: poor dentition[ dentures ]; Last Dentist visit: dentures  Eye : blurred vision [  ]; diplopia [   ]; vision changes [  ];  Amaurosis fugax[  ]; Resp: cough [  ];   wheezing[ n ];  hemoptysis[ n ]; shortness of breath[  ]; paroxysmal nocturnal dyspnea[  ]; dyspnea on exertion[  ]; or orthopnea[  ];  GI:  gallstones[  ], vomiting[  ];  dysphagia[  ]; melena[  ];  hematochezia [  ]; heartburn[  ];   Hx of  Colonoscopy[y 3 years ago  ]; GU: kidney stones [  ]; hematuria[  ];   dysuria [  ];  nocturia[  ];  history of     obstruction [  ];             Skin: rash, swelling[  ];, hair loss[  ];  peripheral edema[  ];  or itching[  ]; Musculosketetal: myalgias[  ];  joint swelling[  ];  joint erythema[  ];  joint pain[  ];  back pain[ y ];  Heme/Lymph: bruising[  ];  bleeding[  ];  anemia[  ];  Neuro: TIA[ n ];  headaches[n  ];  stroke[ n ];  vertigo[ n ];  seizures[  ];   paresthesias[  ];  difficulty walking[ some left leg and left arm weakness ];  Psych:depression[  ]; anxiety[  ];  Endocrine: diabetes[y  ];  thyroid dysfunction[ n ];  Immunizations: Flu Milo.Brash  ]; Pneumococcal[n  ];  Other:  Physical Exam: BP 112/71  Pulse 97  Resp 18  Ht 5\' 4"  (1.626 m)  Wt 158 lb (71.668 kg)  BMI 27.12 kg/m2  SpO2 96%  General appearance: alert and cooperative Neurologic: intact Heart: regular rate and rhythm, S1, S2 normal, no murmur, click, rub or gallop and normal apical impulse Lungs: clear to auscultation bilaterally and normal percussion bilaterally Abdomen: soft, non-tender; bowel sounds normal; no masses,  no organomegaly Extremities: extremities normal, atraumatic, no cyanosis or edema Wound:    Diagnostic Studies & Laboratory data:     Recent Radiology Findings:  Dg Hip Complete Left  11/08/2012  *RADIOLOGY REPORT*  Clinical Data: Left hip, leg, and back pain  LEFT HIP - COMPLETE 2+ VIEW  Comparison: None  Findings: Osseous demineralization. Symmetric preserved hip and SI joints. No acute fracture, dislocation, or bone destruction.  IMPRESSION: No acute osseous abnormalities.   Original Report Authenticated By: Ulyses Southward, M.D.    Nm Pet Image Initial  (pi) Skull Base To Thigh  11/21/2012  *RADIOLOGY REPORT*  Clinical Data: Initial treatment strategy for right upper lobe lung mass.  NUCLEAR MEDICINE PET SKULL BASE TO THIGH  Fasting Blood Glucose:  108  Technique:  18.2 mCi F-18 FDG was injected intravenously. CT data was obtained and used for attenuation correction and anatomic localization only.  (This was not acquired as a diagnostic CT examination.) Additional exam technical data entered on technologist worksheet.  Comparison:  The CT chest 10/08/2012  Findings:  Neck: No hypermetabolic lymph nodes in the neck.  Chest:  The lobulated pulmonary nodule in the right upper lobe measuring 20 mm x 20 mm has low metabolic activity with SUV max = 2.2.  No additional suspicious pulmonary nodules are present. There are no hypermetabolic mediastinal lymph nodes.  Abdomen/Pelvis:  No abnormal hypermetabolic activity within the liver, pancreas, adrenal glands, or spleen.  No hypermetabolic lymph nodes in the abdomen or pelvis.  Skeleton:  No focal hypermetabolic activity to suggest skeletal metastasis.  IMPRESSION:  1.  Low metabolic activity of the right upper lobe pulmonary nodule is atypical for a primary bronchogenic carcinoma.  Due to the size of the lesion, recommend percutaneous biopsy as the lesion remains suspicious for neoplasm and may represent a low grade adenocarcinoma. 2.  No hypermetabolic mediastinal adenopathy or distant metastasis.   Original Report Authenticated By: Genevive Bi, M.D.     Patient has chest x-rays back to 5 2012 did show progressive enlargement of a right upper lobe lung nodule.  Recent Lab Findings: Lab Results  Component Value Date   WBC 7.7 07/17/2012   HGB 13.1 07/17/2012   HCT 39.2 07/17/2012   PLT 256 07/17/2012   GLUCOSE 97 07/17/2012   ALT 15 07/17/2012   AST 22 07/17/2012   NA 137 07/17/2012   K 3.6 07/17/2012   CL 102 07/17/2012   CREATININE 0.56 07/17/2012   BUN 14 07/17/2012   CO2 28 07/17/2012   PFTS FEV1 1.9589%  DLCO 15.9 77%   Assessment / Plan:     Slowly enlarging right upper lobe lung nodule with low metabolic activity on PET scan, but with increasing size over time in a smoker still suspicious for low-grade adenocarcinoma. With the patient's positive smoking history and documented enlarging right upper lobe lung nodule I recommended to the patient that we proceed with surgical resection even without any prior needle biopsy. Surgical procedure of right video-assisted thoracoscopy possible thoracotomy  and lung resection is discussed in detail with the patient including the risks of surgery. We also discussed the treatment options including further observation, I have not encourage this with her because it's been documented the lesion is enlarging.  She is willing to proceed we'll tentatively plan for January 15.       Delight Ovens MD  Beeper [phone] Office [phone] 11/28/2012 5:12 PM

## 2012-11-28 NOTE — Patient Instructions (Signed)
Pulmonary Nodule A pulmonary (lung) nodule is small, round growth in the lung. The size of a pulmonary nodule can be as small as a pencil eraser (1/5 inch or 4 millmeters) to a little bigger than your biggest toenail (1 inch or 25 millimeters). A pulmonary nodule is usually an unplanned finding. It may be found on a chest X-ray or a computed tomography (CT) scan when you have imaging tests of your lungs done. When a pulmonary nodule is found, tests will be done to determine if the nodule is benign (not cancerous) or malignant (cancerous). Follow-up treatment or testing is based on the size of the pulmonary nodule and your risk of getting lung cancer.  CAUSES Causes of pulmonary nodules can vary.  Benign pulmonary nodules  can be caused from different things. Some of these things include:  Infection. This can be a common cause of a benign pulmonary nodule. The infection may be active (a current infection) or an old infection that is no longer active. Three types of infections can cause a pulmonary nodule. These are:  Bacterial Infection.  Fungal infection.  Viral Infections.  Hematoma. This is a bruise in the lung. A hematoma can happen from an injury to your chest.  Some common diseases can lead to benign pulmonary nodules. For example, rheumatoid arthritis can be a cause of a pulmonary nodule.  Other unusual things can cause a benign pulmonary nodule. These can include:  Having had tuberculosis.  Rare diseases, such as a lung cyst. Malignant pulmonary nodules.  These are cancerous growths. The cancer may have:  Started in the lung. Some lung cancers first detected as a pulmonary nodule.  Spread to the lung from cancer somewhere else in the body. This is called metastatic cancer.  Certain risk factors make a cancerous pulmonary nodule more likely. They include:  Age. As people get older, a pulmonary nodule is more likely to be cancerous.  Cancer history. If one of your immediate  family members has had cancer, you have a higher risk of developing cancer.  Smoking. This includes people who currently smoke and those who have quit. DIAGNOSIS To diagnose whether a pulmonary nodule is benign or malignant, a variety of tests will be done. This includes things such as:  Health history. Questions regarding your current health, past health, and family health will be asked.  Blood tests. Results of blood work can show:  Tumor markers for cancer.  Any type of infection.  A skin test called a tuberculin (TB) test may be done. This test can tell if you have been exposed to the germ that causes tuberculosis.  Imaging tests. These take pictures of your lungs. Types of imaging tests include:  Chest X-ray. This can help in several ways. An X-ray gives a close-up look at the pulmonary nodule. A new X-ray can be compared with any X-rays you have had in the past.   Computed tomography  (CT) scan. This test shows smaller pulmonary nodules more clearly than an X-ray.  Positron emission tomography  (PET) scan. This is a test that uses a radioactive substance to identify a pulmonary nodule. A safe amount of radioactive substance is injected into the blood stream. Then, the scan takes a picture of the pulmonary nodule. A malignant pulmonary nodule will absorb the substance faster than a benign pulmonary nodule. The radioactive substance is eliminated from your body in your urine.  Biopsy.  This removes a tiny piece of the pulmonary nodule so it can be checked  under a microscope. Medicine will be given to help keep you relaxed and pain free when a biopsy is done. Types of biopsies include:  Bronchoscopy . This is a surgical procedure. It can be used for pulmonary nodules that are close to the airways in the lung. It uses a scope (a thin tube) with a tiny camera and light on the end. The scope is put in the windpipe. Your caregiver can then see inside the lung. A tiny tool put through the  scope is used to take a small sample of the pulmonary nodule tissue.  Transthoracic needle aspiration . This method is used if the pulmonary nodule is far away from the air passages in the lung. A long, thin needle is put through the chest into the lung nodule. A CT scan is done at the same time which can make it easier to locate the pulmonary nodule.  Surgical lung biopsy . This is a surgical procedure in which the pulmonary nodule is removed. This is usually recommended when the pulmonary nodule is most likely malignant or a biopsy cannot be obtained by either bronchoscopy or transthoracic needle aspiration. PULMONARY NODULE FOLLOW-UP RECOMMENDATIONS The frequency of pulmonary nodule follow-up is based on your risk factors and size of the pulmonary nodule. If your caregiver suspects the pulmonary nodule is cancerous or the pulmonary nodule changes during any of the follow-up CT scans, additional testing or biopsies will be done.   If you have no or low risk of getting lung cancer (non-smoker, no personal cancer history), recommended follow-up is based on the following pulmonary nodule size:  A pulmonary nodule that is < 4 mm does not require any follow-up.  A pulmonary nodule that is 4 to 6 mm should be re-imaged by CT scan in 12 months.  A pulmonary nodule that is 6 to 8 mm should be re-imaged by CT scan at 6 to 12 months and then again at 18 to 24 months if no change in size.  A pulmonary nodule > 8 mm in size should be followed closely and re-imaged by CT scan at 3, 9, and 24 months.   If you are at risk of getting lung cancer (current or former smoker, family history of cancer), recommended follow-up is based on the following pulmonary nodule size:  A pulmonary nodule that is < 4 mm in size should be re-imaged by CT scan in 12 months.  A pulmonary nodule that is 4 to 6 mm in size should be re-imaged by CT scan at 6 to 12 months and again at 18 to 24 months.  A pulmonary nodule that is  6 to 8 mm in size should be re-imaged by CT scan at 3, 9, and 24 months.  A pulmonary nodule > 8 mm in size should be followed closely and re-imaged by CT scan at 3, 9, and 24 months. SEEK MEDICAL CARE IF: While waiting for test results to determine what type of pulmonary nodule you have, be sure to contact your caregiver if you:  Have trouble breathing when you are active.  Feel sick or unusually tired.  Do not feel like eating.  Lose weight without trying to.  Develop chills or night sweats.  Mild or moderate fevers generally have no long-term effects and often do not require treatment. There are a few exceptions (see below). SEEK IMMEDIATE MEDICAL CARE IF:  You cannot catch your breath or you begin wheezing.  You cannot stop coughing.  You cough up blood.  You feel like you are going to pass out or become dizzy.  You have sudden chest pain.  You have a fever or persistent symptoms for more than 72 hours.  You have a fever and your symptoms suddenly get worse. MAKE SURE YOU   Understand these instructions.  Will watch your condition.  Will get help right away if you are not doing well or get worse. Document Released: 09/03/2009 Document Revised: 01/29/2012 Document Reviewed: 09/03/2009 [facility] Patient Information 2013 [place], [place].  Lung Cancer Lung cancer is a tumor which starts as a growth in your lungs. Cancer is a group of many related diseases that begin in cells, the building blocks of the body. Normally, cells grow and divide to produce more cells only when the body needs them. Sometimes cells keep dividing when new cells are not needed. These extra cells may form a mass of tissue called a growth or tumor. Tumors can be either benign (not cancerous) or malignant (cancerous). Cancer can begin in any organ or tissue of the body. The original tumor (where the tumor started out) is called the primary cancer and is usually named for where it begins.  Lung cancer is  the most common cause of cancer death in men and women. There are several different types of lung cancers. Usually, lung cancer is described as either small-cell lung cancer or non-small-cell lung cancer. Other types of cancer occur in the lungs, including carcinoid and cancers spread from other organs. The types of cancer have different behavior and treatment. CAUSES  This cancer usually starts when the lungs are exposed to harmful chemicals. When you quit smoking, your risk of lung cancer falls each year (but is never the same as a person who has never smoked).  Other risks include:   Radon gas exposure.  Asbestos and other industrial substance exposure.  Second hand tobacco smoke.  Air pollution.  Family or personal history of lung cancer.  Age over 35. SYMPTOMS  Lung cancer can cause many symptoms. They depend on the type of cancer, its location and other factors. Symptoms of lung cancer can include:  Cough (either new, different or more severe).  Shortness of breath.  Coughing up blood (hemoptysis).  Chest pain.  Hoarseness.  Swelling of the face.  Drooping eyelid.  Changes in blood tests: low sodium (hyponatremia), high calcium (hypercalcemia) or low blood count (anemia).  Weight loss. In its early stages, lung cancer may not have symptoms and can be discovered by accident. Many of the symptoms above can be caused by diseases other than lung cancer. DIAGNOSIS  In early lung cancer, the patient often does not notice problems. It usually has spread by the time problems are first noticed. Your caregiver may suspect lung cancer based on your symptoms, your exam or based on tests (such as x-rays) obtained for other reasons. Common tests that help your caregiver diagnose your condition include:  Chest x-ray.  CT scan of the lungs and chest.  Blood tests. If a tumor is found, a biopsy will be necessary to confirm that cancer is present and to determine the type of  cancer. TREATMENT   Surgery offers a hope for a cure if the cancer has not spread and the cancer is not a small cell (oat cell) cancer of the lung. Surgery cannot cure the small cell type of cancer.  Radiation Therapy is a form of high energy X-ray that helps slow or kill the cancer. It is often used along with medications (  chemotherapy) to help treat the cancer and control pain.  Chemotherapy is used in combination with surgery in advanced cancer. It is also used in all small cell cancers.  Many new treatments look promising.  Your caregiver can give you more information and discuss treatment options that are best for your type of cancer. HOME CARE INSTRUCTIONS   If you smoke, stop!  Take all medications as told.  Keep all appointments with your caregiver and other specialists.  Ask your caregiver if you should see a cancer specialist, if that has not been arranged.  If you require oxygen or breathing equipment, be sure you know how to use it and who to call with questions.  Follow any special diet directions. If you have problems with appetite, ask your caregiver for help. SEEK MEDICAL CARE IF:   You have had a surgical procedure are you are having trouble recovering.  You have ongoing weight loss.  You have decreased strength or energy past the point when your caregiver said you would feel better.  You develop nausea or lightheadedness.  You have pain that is not improving. SEEK IMMEDIATE MEDICAL CARE IF:   You cough up clotted blood or bright red blood.  Your pain is uncontrolled.  You develop new difficulty breathing or chest pain.  You develop swelling in one or both ankles or legs, or swelling in your face or neck.  You develop new headache or confusion. Document Released: 02/12/2001 Document Revised: 01/29/2012 Document Reviewed: 11/23/2008 [facility] Patient Information 2013 [place], [place].  Lung Resection A lung resection is surgery to remove a lung. When  an entire lung is removed, the procedure is called a pneumonectomy. When only part of a lung is removed, the procedure is called a lobectomy. A lung resection is typically done to get rid of a tumor or cancer. This surgery can help relieve some or all of your symptoms. The surgery can also help keep the problem from getting worse. It may provide the best chance for curing your disease. However, surgery may not necessarily cure lung cancer, if that is the problem. Most people need to stay in the hospital for several days after this procedure.  LET YOUR CAREGIVER KNOW ABOUT: Allergies to food or medicine. Medicines taken, including vitamins, herbs, eyedrops, over-the-counter medicines, and creams. Use of steroids (by mouth or creams). Previous problems with anesthetics or numbing medicines. History of bleeding problems or blood clots. Previous surgery. Other health problems, including diabetes and kidney problems. Possibility of pregnancy, if this applies. RISKS AND COMPLICATIONS  Lung resections have been done for many years with good results and few complications. However, all surgery is associated with possible risks. Some of these risks are: Excessive bleeding. Infection. Inability to breath without a ventilator. Persistent shortness of breath. Heart problems, including abnormal rhythms and a risk of heart attack or heart failure. Blood clots. Injury to a blood vessel. Injury to a nerve. Failure to heal properly. Stroke. Bronchopleural fistula. This is a small hole between one of the main breathing tubes and the lining of the lungs. BEFORE THE PROCEDURE  In order to prepare for surgery, your caregiver may ask for several tests to be done. These may include: Blood tests. Urine tests. X-rays. Imaging tests, such as CT scans, MRI scans, and PET scans. These tests are done to find the exact size and location of the tumor that will be removed. Pulmonary function tests (PFTs). These are  breathing tests to assess the function of your lungs before  surgery and to decide how to best help your breathing after surgery. Heart testing. This is done to make sure your heart is strong enough for the procedure. Bronchoscopy. This is a technique that allows your caregiver to look at the inside of your airways. This is done using a soft, flexible tube (bronchoscope). Along with imaging tests, this can help your caregiver know the exact location and size of the area that will be removed during surgery. Lymph node sampling. This may need to be done to see if the tumor has spread. It may be done as a separate surgery or right before your lung resection procedure. PROCEDURE An intravenous line (IV) will be placed in your arm. You will be given medicine that makes you sleep (general anesthetic). Once you are asleep, a breathing tube is placed into your windpipe. You may also get pain medicine through a thin, flexible tube (catheter) in your back. The catheter is put through your skin and next to your spinal cord, where it releases anesthetic medicine. Next, you will be turned onto your side. This makes it easier for your surgeon to reach the area of your ribcage where the surgical cut (incision) will be made. This area is washed with a disinfectant solution and might also be shaved. A catheter will be put into your bladder to collect urine. Another tube will be carefully passed through your throat and into your stomach. The surgeon will make an incision on your side, which will start between two of your ribs and go around to your back. Your ribs will be spread and held open. Part of one rib may be removed to make it easier for the surgeon to reach your lung. Your surgeon will carefully cut the veins, arteries, and bronchus leading to the lung. After being cut, each of these pieces will be sewn or stapled closed. Then, the lung or part of the lung will be removed. Your surgeon will check inside your chest to  make sure there is no bleeding in or around the lungs. Lymph nodes near the lung may also be removed for later tests. This is done to check if your problems have spread to the lymph nodes. Depending on your situation, your surgeon may put tubes into your chest to drain extra fluid and air from the chest cavity after surgery. After the tubes are in, your ribcage will be closed with stitches. The stitches help your ribcage heal and keep it from moving. After this, the layers of tissue under the skin are closed with more stitches, which will dissolve inside your body over time. Finally, your skin is closed with stitches or staples and covered with a bandage. AFTER THE PROCEDURE  After surgery, you will be taken to the recovery area where a nurse will monitor your progress. You may still have a breathing tube, spinal catheter, bladder catheter, stomach tube, and possibly chest tubes inside your body. These will be removed during your recovery. You may be put on a respirator following surgery if some assistance is needed to help your breathing. When you are awake, stable, and without complications, you will likely continue recovery in the intensive care unit (ICU). As you wake up, you might feel some aches and pains in your chest and throat. Sometimes during recovery, patients may shiver or feel nauseous. Both of these symptoms are temporary and may be caused by the anesthesia. Your caregivers can give you medicine to help these problems go away. The breathing tube will be taken  out as soon as your caregivers feel you can breathe on your own. For most people, this happens on the same day as the surgery. If your surgery and time in the ICU go well, most of the tubes and equipment will be taken out within the first 1 to 2 days after surgery. This is about how long most people stay in the ICU. You may need to stay longer, depending on how you are doing. You should also start respiratory therapy in the ICU. This  therapy uses breathing exercises to help your other lung stay healthy and get stronger. As you improve, you will be moved to a regular hospital room for continued respiratory therapy, help with your bladder and bowels, and to continue medicines. Most people stay in the hospital for 5 to 7 days. However, your stay may be longer, depending on how your surgery went and how well you are doing. After your lung or part of your lung is taken out, there will be a space inside your chest. This space will often fill up with fluid over time. The amount of time this takes is different for each person. Because your chest needs to fill with fluid, your surgeon may or may not put a drainage tube in your chest. If there is a chest tube, it will most likely be removed within 24 hours after the surgery. You will receive care until you are doing well and your caregiver feels it is safe for you to go home or to transfer to an extended care facility. Document Released: 01/27/2003 Document Revised: 01/29/2012 Document Reviewed: 07/06/2011 Lung Resection Care After Refer to this sheet in the next few weeks. These instructions provide you with information on caring for yourself after your procedure. Your caregiver may also give you more specific instructions. Your treatment has been planned according to current medical practices, but problems sometimes occur. Call your caregiver if you have any problems or questions after your procedure. HOME CARE INSTRUCTIONS You may resume a normal diet and activities as directed. Do not smoke or use tobacco products. Change your bandages (dressings) as directed. Only take over-the-counter or prescription medicines for pain, discomfort, or fever as directed by your caregiver. Keep all follow-up appointments as directed. Try to breathe deeply and cough as directed. Holding a pillow firmly over your ribs may help with discomfort. If you were given an incentive spirometer in the hospital,  continue to use it as directed. Walk as directed by your caregiver. You may take a shower and gently wash the area of your surgical cut (incision) with water and soap as directed. Do not use anything else to clean your incision except as directed by your caregiver. Do not take baths or sit in a hot tub. SEEK MEDICAL CARE IF: You notice redness, swelling, or increasing pain in the incision. You are bleeding from the incision. You see pus coming from the incision. You notice a bad smell coming from the incision or dressing. Your incision breaks open. You cough up blood or pus, or you develop a cough that produces bad smelling sputum. You have pain or swelling in your legs. You have increasing pain that is not controlled with medicine. You have trouble managing any of the tubes that have been left in place after surgery. SEEK IMMEDIATE MEDICAL CARE IF:  You have a fever or chills. You have any reaction or side effects to medicines given. You have chest pain or an irregular or rapid heartbeat. You have dizzy episodes  or fainting. You have shortness of breath or difficulty breathing. You have persistent nausea or vomiting. You have a rash. MAKE SURE YOU: Understand these instructions. Will watch your condition. Will get help right away if you are not doing well or get worse. Document Released: 05/26/2005 Document Revised: 01/29/2012 Document Reviewed: 07/06/2011 [facility] Patient Information 2013 [place], [place].

## 2012-11-29 ENCOUNTER — Encounter (HOSPITAL_COMMUNITY): Payer: Self-pay | Admitting: Pharmacy Technician

## 2012-11-29 ENCOUNTER — Other Ambulatory Visit: Payer: Self-pay | Admitting: *Deleted

## 2012-11-29 DIAGNOSIS — R918 Other nonspecific abnormal finding of lung field: Secondary | ICD-10-CM

## 2012-12-03 ENCOUNTER — Encounter (HOSPITAL_COMMUNITY): Payer: Self-pay | Admitting: *Deleted

## 2012-12-03 ENCOUNTER — Encounter (HOSPITAL_COMMUNITY): Payer: Self-pay

## 2012-12-03 ENCOUNTER — Encounter (HOSPITAL_COMMUNITY)
Admission: RE | Admit: 2012-12-03 | Discharge: 2012-12-03 | Disposition: A | Discharge status: Home / Self Care | Payer: Medicaid Other | Source: Ambulatory Visit | Attending: Cardiothoracic Surgery | Admitting: Cardiothoracic Surgery

## 2012-12-03 ENCOUNTER — Emergency Department (HOSPITAL_COMMUNITY)
Admission: EM | Admit: 2012-12-03 | Discharge: 2012-12-03 | Disposition: A | Discharge status: Home / Self Care | Payer: Medicaid Other | Attending: Emergency Medicine | Admitting: Emergency Medicine

## 2012-12-03 ENCOUNTER — Encounter (HOSPITAL_COMMUNITY): Payer: Self-pay | Admitting: Pharmacy Technician

## 2012-12-03 VITALS — BP 136/67 | HR 100 | Temp 98.8°F | Resp 20 | Ht 64.0 in | Wt 156.6 lb

## 2012-12-03 DIAGNOSIS — R222 Localized swelling, mass and lump, trunk: Secondary | ICD-10-CM | POA: Insufficient documentation

## 2012-12-03 DIAGNOSIS — F17200 Nicotine dependence, unspecified, uncomplicated: Secondary | ICD-10-CM | POA: Insufficient documentation

## 2012-12-03 DIAGNOSIS — F411 Generalized anxiety disorder: Secondary | ICD-10-CM | POA: Insufficient documentation

## 2012-12-03 DIAGNOSIS — M5430 Sciatica, unspecified side: Secondary | ICD-10-CM | POA: Insufficient documentation

## 2012-12-03 DIAGNOSIS — R918 Other nonspecific abnormal finding of lung field: Secondary | ICD-10-CM

## 2012-12-03 DIAGNOSIS — I10 Essential (primary) hypertension: Secondary | ICD-10-CM | POA: Insufficient documentation

## 2012-12-03 DIAGNOSIS — F319 Bipolar disorder, unspecified: Secondary | ICD-10-CM | POA: Insufficient documentation

## 2012-12-03 DIAGNOSIS — M79609 Pain in unspecified limb: Secondary | ICD-10-CM | POA: Insufficient documentation

## 2012-12-03 DIAGNOSIS — Z8739 Personal history of other diseases of the musculoskeletal system and connective tissue: Secondary | ICD-10-CM | POA: Insufficient documentation

## 2012-12-03 DIAGNOSIS — E119 Type 2 diabetes mellitus without complications: Secondary | ICD-10-CM | POA: Insufficient documentation

## 2012-12-03 DIAGNOSIS — Z79899 Other long term (current) drug therapy: Secondary | ICD-10-CM | POA: Insufficient documentation

## 2012-12-03 DIAGNOSIS — Z7982 Long term (current) use of aspirin: Secondary | ICD-10-CM | POA: Insufficient documentation

## 2012-12-03 DIAGNOSIS — R011 Cardiac murmur, unspecified: Secondary | ICD-10-CM | POA: Insufficient documentation

## 2012-12-03 HISTORY — DX: Anxiety disorder, unspecified: F41.9

## 2012-12-03 LAB — URINALYSIS, ROUTINE W REFLEX MICROSCOPIC
Bilirubin Urine: NEGATIVE
Glucose, UA: NEGATIVE mg/dL
Hgb urine dipstick: NEGATIVE
Ketones, ur: NEGATIVE mg/dL
Leukocytes, UA: NEGATIVE
Nitrite: NEGATIVE
Protein, ur: NEGATIVE mg/dL
Specific Gravity, Urine: 1.025 (ref 1.005–1.030)
Urobilinogen, UA: 1 mg/dL (ref 0.0–1.0)
pH: 6 (ref 5.0–8.0)

## 2012-12-03 LAB — COMPREHENSIVE METABOLIC PANEL
ALT: 14 U/L (ref 0–35)
AST: 19 U/L (ref 0–37)
Albumin: 3.7 g/dL (ref 3.5–5.2)
Alkaline Phosphatase: 76 U/L (ref 39–117)
BUN: 22 mg/dL (ref 6–23)
CO2: 24 mEq/L (ref 19–32)
Calcium: 9.2 mg/dL (ref 8.4–10.5)
Chloride: 101 mEq/L (ref 96–112)
Creatinine, Ser: 0.53 mg/dL (ref 0.50–1.10)
GFR calc Af Amer: >90 mL/min (ref >90)
GFR calc non Af Amer: >90 mL/min (ref >90)
Glucose, Bld: 147 mg/dL — ABNORMAL HIGH (ref 70–99)
Potassium: 3.3 mEq/L — ABNORMAL LOW (ref 3.5–5.1)
Sodium: 139 mEq/L (ref 135–145)
Total Bilirubin: 0.5 mg/dL (ref 0.3–1.2)
Total Protein: 6.9 g/dL (ref 6.0–8.3)

## 2012-12-03 LAB — CBC
HCT: 40.3 % (ref 36.0–46.0)
Hemoglobin: 13.2 g/dL (ref 12.0–15.0)
MCH: 28.9 pg (ref 26.0–34.0)
MCHC: 32.8 g/dL (ref 30.0–36.0)
MCV: 88.2 fL (ref 78.0–100.0)
Platelets: 225 10*3/uL (ref 150–400)
RBC: 4.57 MIL/uL (ref 3.87–5.11)
RDW: 13.5 % (ref 11.5–15.5)
WBC: 6 10*3/uL (ref 4.0–10.5)

## 2012-12-03 LAB — SURGICAL PCR SCREEN
MRSA, PCR: NEGATIVE
Staphylococcus aureus: NEGATIVE

## 2012-12-03 LAB — BLOOD GAS, ARTERIAL
Acid-Base Excess: 4.3 mmol/L — ABNORMAL HIGH (ref 0.0–2.0)
Bicarbonate: 28.4 mEq/L — ABNORMAL HIGH (ref 20.0–24.0)
Drawn by: 344381
O2 Saturation: 97.5 %
Patient temperature: 98.6
TCO2: 29.7 mmol/L (ref 0–100)
pCO2 arterial: 43.2 mmHg (ref 35.0–45.0)
pH, Arterial: 7.433 (ref 7.350–7.450)
pO2, Arterial: 90 mmHg (ref 80.0–100.0)

## 2012-12-03 LAB — APTT: aPTT: 30 seconds (ref 24–37)

## 2012-12-03 LAB — PROTIME-INR
INR: 1 (ref 0.00–1.49)
Prothrombin Time: 13.1 seconds (ref 11.6–15.2)

## 2012-12-03 LAB — ABO/RH: ABO/RH(D): O POS

## 2012-12-03 MED ORDER — VANCOMYCIN HCL IN DEXTROSE 1-5 GM/200ML-% IV SOLN: 1000.0000 mg | INTRAVENOUS | Status: DC

## 2012-12-03 MED ORDER — VANCOMYCIN HCL IN DEXTROSE 1-5 GM/200ML-% IV SOLN
1000.0000 mg | INTRAVENOUS | Status: DC
  Filled 2012-12-03: qty 200

## 2012-12-03 MED ORDER — HYDROMORPHONE HCL PF 1 MG/ML IJ SOLN
1.0000 mg | Freq: Once | INTRAMUSCULAR | Status: AC
  Administered 2012-12-03: 1 mg via INTRAMUSCULAR
  Filled 2012-12-03: qty 1

## 2012-12-03 NOTE — Pre-Procedure Instructions (Signed)
[name] [name]  12/03/2012   Your procedure is scheduled on:  Dec 04, 2012  Report to [facility] at 6:30 AM.  Call this number if you have problems the morning of surgery: [phone]   Remember:   Do not eat food or drink liquids after midnight.   Take these medicines the morning of surgery with A SIP OF WATER: inhaler as needed, neurontin, pain pill as needed, claritin, dexilant   Do not wear jewelry, make-up or nail polish.  Do not wear lotions, powders, or perfumes. You may wear deodorant.  Do not shave 48 hours prior to surgery. Men may shave face and neck.  Do not bring valuables to the hospital.  Contacts, dentures or bridgework may not be worn into surgery.  Leave suitcase in the car. After surgery it may be brought to your room.  For patients admitted to the hospital, checkout time is 11:00 AM the day of  discharge.   Patients discharged the day of surgery will not be allowed to drive  home.  Name and phone number of your driver:   Special Instructions: Shower using CHG 2 nights before surgery and the night before surgery.  If you shower the day of surgery use CHG.  Use special wash - you have one bottle of CHG for all showers.  You should use approximately 1/3 of the bottle for each shower.   Please read over the following fact sheets that you were given: Pain Booklet, Coughing and Deep Breathing, Blood Transfusion Information and Surgical Site Infection Prevention

## 2012-12-03 NOTE — Progress Notes (Signed)
Notified patient of new surgery time 14:35pm, and of new arrival time 8:30am.

## 2012-12-03 NOTE — ED Notes (Signed)
Pt is here with having left hip pain that she states is sciatica because she has had it before.

## 2012-12-03 NOTE — ED Notes (Signed)
Pharmacy called to make aware of discontinuation of vancomycin and decide to be continued or not.

## 2012-12-03 NOTE — ED Provider Notes (Signed)
History     CSN: [id number]  Arrival date & time 12/03/12  1206   None     Chief Complaint  Patient presents with  . Hip Pain    (Consider location/radiation/quality/duration/timing/severity/associated sxs/prior treatment) HPI Patient presents to the emergency department complaining of left hip and leg pain. She has a previous diagnosis of sciatica, and she says that the pain is worse today. She has Lortab from her orthopedist, Dr. Ave Filter, but she says that only soothes the pain and doesn't take it away. She has surgery scheduled for tomorrow to remove a lung mass, and wants to feel better before her surgery. She states that sometimes she has a tingling sensation in her left foot and leg, but denies any weakness or sensory deficits. The pain is relieved by certain positions, including pressure on her left hip. She has some physical therapy exercises that she does occasionally but says that they hurt to perform. She states that she gets injections in her hip once a month. She asks for something stronger for the pain, specifically mentioning Dilaudid.  Patient denies gait disturbances, weakness, numbness and other sensory deficits.      Past Medical History  Diagnosis Date  . Hypertension   . Diabetes mellitus   . Bipolar 1 disorder   . Heart murmur   . Sciatica   . Arthritis   . Hepatitis     hep c ?, pt states thinks  . Anxiety     Past Surgical History  Procedure Date  . Facial tumor removal   . Brain tumor removal   . Brain surgery     in [place]    Family History  Problem Relation Age of Onset  . Hypertension Father     deceased  . Diabetes Father   . Heart disease Father   . Hyperlipidemia Father   . Hypertension Mother   . Hyperlipidemia Mother   . Diabetes Mother   . Hypertension Sister   . Hyperlipidemia Sister   . Hypertension Brother   . Hyperlipidemia Brother     History  Substance Use Topics  . Smoking status: Current Some Day Smoker -- 10  years    Types: Cigarettes  . Smokeless tobacco: Never Used     Comment: Smokes 1/2 pack per week  . Alcohol Use: No    OB History    Grav Para Term Preterm Abortions TAB SAB Ect Mult Living                  Review of Systems  Musculoskeletal: Negative for gait problem.  Neurological: Negative for tremors, weakness and numbness.    Allergies  Haldol; Morphine and related; and Penicillins  Home Medications   Current Outpatient Rx  Name  Route  Sig  Dispense  Refill  . ALBUTEROL SULFATE HFA 108 (90 BASE) MCG/ACT IN AERS   Inhalation   Inhale 2 puffs into the lungs every 6 (six) hours as needed. For shortness of breath         . ALPRAZOLAM 1 MG PO TABS   Oral   Take 0.5 mg by mouth at bedtime as needed. For sleep and anxiety         . ASPIRIN 81 MG PO TABS   Oral   Take 81 mg by mouth daily.          Marland Kitchen VITAMIN D 1000 UNITS PO TABS   Oral   Take 1,000 Units by mouth once a week. On  Mondays         . DEXLANSOPRAZOLE 60 MG PO CPDR   Oral   Take 60 mg by mouth daily.         Marland Kitchen GABAPENTIN 400 MG PO CAPS   Oral   Take 400 mg by mouth 3 (three) times daily.          Marland Kitchen HYDROCODONE-ACETAMINOPHEN 10-500 MG PO TABS   Oral   Take 1 tablet by mouth every 6 (six) hours as needed. Pain         . LORATADINE 10 MG PO TABS   Oral   Take 10 mg by mouth daily.         Marland Kitchen METFORMIN HCL 500 MG PO TABS   Oral   Take 500 mg by mouth 2 (two) times daily with a meal.          . OLMESARTAN-AMLODIPINE-HCTZ 40-10-25 MG PO TABS   Oral   Take 1 tablet by mouth daily.           BP 125/70  Pulse 99  Temp 98 F (36.7 C) (Oral)  Resp 16  SpO2 98%  Physical Exam  Nursing note and vitals reviewed. Constitutional: She is oriented to person, place, and time. She appears well-developed and well-nourished. No distress.  HENT:  Head: Normocephalic and atraumatic.  Musculoskeletal:       Left hip: She exhibits normal strength, no tenderness, no swelling and no  deformity.  Neurological: She is alert and oriented to person, place, and time. She has normal strength.       No gross motor or sensory deficits in left leg or foot. Strength and ROM normal.  Skin: Skin is warm and dry.    ED Course  Procedures (including critical care time)  Previous L hip x-ray on 11/08/12 showed no acute or degenerative findings. [name] advised to return here as needed. Follow up with her PCP.    MDM         Carlyle Dolly, PA-C 12/03/12 1445

## 2012-12-04 ENCOUNTER — Encounter (HOSPITAL_COMMUNITY): Payer: Self-pay | Admitting: *Deleted

## 2012-12-04 ENCOUNTER — Encounter (HOSPITAL_COMMUNITY): Payer: Self-pay | Admitting: Anesthesiology

## 2012-12-04 ENCOUNTER — Encounter (HOSPITAL_COMMUNITY)
Admission: RE | Disposition: A | Discharge status: Home / Self Care | Payer: Self-pay | Source: Ambulatory Visit | Attending: Cardiothoracic Surgery

## 2012-12-04 ENCOUNTER — Inpatient Hospital Stay (HOSPITAL_COMMUNITY): Payer: Medicaid Other

## 2012-12-04 ENCOUNTER — Encounter (HOSPITAL_COMMUNITY): Payer: Self-pay | Admitting: Certified Registered Nurse Anesthetist

## 2012-12-04 ENCOUNTER — Ambulatory Visit (HOSPITAL_COMMUNITY): Admission: RE | Admit: 2012-12-04 | Payer: Medicaid Other | Source: Ambulatory Visit | Admitting: Cardiothoracic Surgery

## 2012-12-04 ENCOUNTER — Ambulatory Visit (HOSPITAL_COMMUNITY): Payer: Medicaid Other | Admitting: Anesthesiology

## 2012-12-04 ENCOUNTER — Inpatient Hospital Stay (HOSPITAL_COMMUNITY)
Admission: RE | Admit: 2012-12-04 | Discharge: 2012-12-12 | DRG: 164 | Disposition: A | Discharge status: Home / Self Care | Payer: Medicaid Other | Source: Ambulatory Visit | Attending: Cardiothoracic Surgery | Admitting: Cardiothoracic Surgery

## 2012-12-04 DIAGNOSIS — J4489 Other specified chronic obstructive pulmonary disease: Secondary | ICD-10-CM | POA: Diagnosis present

## 2012-12-04 DIAGNOSIS — J449 Chronic obstructive pulmonary disease, unspecified: Secondary | ICD-10-CM | POA: Diagnosis present

## 2012-12-04 DIAGNOSIS — F419 Anxiety disorder, unspecified: Secondary | ICD-10-CM | POA: Diagnosis present

## 2012-12-04 DIAGNOSIS — R918 Other nonspecific abnormal finding of lung field: Secondary | ICD-10-CM

## 2012-12-04 DIAGNOSIS — M5430 Sciatica, unspecified side: Secondary | ICD-10-CM | POA: Diagnosis not present

## 2012-12-04 DIAGNOSIS — Z7982 Long term (current) use of aspirin: Secondary | ICD-10-CM

## 2012-12-04 DIAGNOSIS — F3110 Bipolar disorder, current episode manic without psychotic features, unspecified: Secondary | ICD-10-CM | POA: Diagnosis not present

## 2012-12-04 DIAGNOSIS — F17200 Nicotine dependence, unspecified, uncomplicated: Secondary | ICD-10-CM | POA: Diagnosis present

## 2012-12-04 DIAGNOSIS — I10 Essential (primary) hypertension: Secondary | ICD-10-CM | POA: Diagnosis present

## 2012-12-04 DIAGNOSIS — C3410 Malignant neoplasm of upper lobe, unspecified bronchus or lung: Secondary | ICD-10-CM

## 2012-12-04 DIAGNOSIS — M129 Arthropathy, unspecified: Secondary | ICD-10-CM | POA: Diagnosis present

## 2012-12-04 DIAGNOSIS — F411 Generalized anxiety disorder: Secondary | ICD-10-CM | POA: Diagnosis present

## 2012-12-04 DIAGNOSIS — K5730 Diverticulosis of large intestine without perforation or abscess without bleeding: Secondary | ICD-10-CM | POA: Diagnosis present

## 2012-12-04 DIAGNOSIS — F319 Bipolar disorder, unspecified: Secondary | ICD-10-CM | POA: Diagnosis present

## 2012-12-04 DIAGNOSIS — E119 Type 2 diabetes mellitus without complications: Secondary | ICD-10-CM | POA: Diagnosis present

## 2012-12-04 DIAGNOSIS — F4310 Post-traumatic stress disorder, unspecified: Secondary | ICD-10-CM | POA: Diagnosis present

## 2012-12-04 DIAGNOSIS — Z23 Encounter for immunization: Secondary | ICD-10-CM

## 2012-12-04 DIAGNOSIS — Z9889 Other specified postprocedural states: Secondary | ICD-10-CM

## 2012-12-04 DIAGNOSIS — Y836 Removal of other organ (partial) (total) as the cause of abnormal reaction of the patient, or of later complication, without mention of misadventure at the time of the procedure: Secondary | ICD-10-CM | POA: Diagnosis not present

## 2012-12-04 DIAGNOSIS — J939 Pneumothorax, unspecified: Secondary | ICD-10-CM

## 2012-12-04 DIAGNOSIS — J95811 Postprocedural pneumothorax: Secondary | ICD-10-CM | POA: Diagnosis not present

## 2012-12-04 DIAGNOSIS — R82998 Other abnormal findings in urine: Secondary | ICD-10-CM | POA: Diagnosis not present

## 2012-12-04 DIAGNOSIS — Z79899 Other long term (current) drug therapy: Secondary | ICD-10-CM

## 2012-12-04 DIAGNOSIS — E876 Hypokalemia: Secondary | ICD-10-CM | POA: Diagnosis not present

## 2012-12-04 HISTORY — PX: LOBECTOMY: SHX5089

## 2012-12-04 HISTORY — PX: VIDEO ASSISTED THORACOSCOPY (VATS)/WEDGE RESECTION: SHX6174

## 2012-12-04 HISTORY — DX: Diverticulosis of intestine, part unspecified, without perforation or abscess without bleeding: K57.90

## 2012-12-04 HISTORY — PX: VIDEO BRONCHOSCOPY: SHX5072

## 2012-12-04 LAB — GLUCOSE, CAPILLARY
Glucose-Capillary: 92 mg/dL (ref 70–99)
Glucose-Capillary: 78 mg/dL (ref 70–99)
Glucose-Capillary: 162 mg/dL — ABNORMAL HIGH (ref 70–99)

## 2012-12-04 SURGERY — BRONCHOSCOPY, VIDEO-ASSISTED
Anesthesia: General | Site: Chest | Laterality: Right | Wound class: Clean Contaminated

## 2012-12-04 MED ORDER — ARTIFICIAL TEARS OP OINT
TOPICAL_OINTMENT | OPHTHALMIC | Status: DC | PRN
  Administered 2012-12-04: 1 via OPHTHALMIC

## 2012-12-04 MED ORDER — FENTANYL CITRATE 0.05 MG/ML IJ SOLN
INTRAMUSCULAR | Status: AC
  Filled 2012-12-04: qty 2

## 2012-12-04 MED ORDER — GLYCOPYRROLATE 0.2 MG/ML IJ SOLN
INTRAMUSCULAR | Status: DC | PRN
  Administered 2012-12-04: 0.6 mg via INTRAVENOUS

## 2012-12-04 MED ORDER — FENTANYL 10 MCG/ML IV SOLN
INTRAVENOUS | Status: DC
  Administered 2012-12-04: 21:00:00 via INTRAVENOUS
  Administered 2012-12-05: 190 ug via INTRAVENOUS
  Administered 2012-12-05: 60 ug via INTRAVENOUS
  Administered 2012-12-05: 07:00:00 via INTRAVENOUS
  Filled 2012-12-04 (×3): qty 50

## 2012-12-04 MED ORDER — NEOSTIGMINE METHYLSULFATE 1 MG/ML IJ SOLN
INTRAMUSCULAR | Status: DC | PRN
  Administered 2012-12-04: 5 mg via INTRAVENOUS

## 2012-12-04 MED ORDER — PHENYLEPHRINE HCL 10 MG/ML IJ SOLN
10.0000 mg | INTRAVENOUS | Status: DC | PRN
  Administered 2012-12-04: 25 ug/min via INTRAVENOUS

## 2012-12-04 MED ORDER — IRBESARTAN 300 MG PO TABS
300.0000 mg | ORAL_TABLET | Freq: Every day | ORAL | Status: DC
  Administered 2012-12-05 – 2012-12-08 (×4): 300 mg via ORAL
  Filled 2012-12-04 (×5): qty 1

## 2012-12-04 MED ORDER — LORATADINE 10 MG PO TABS
10.0000 mg | ORAL_TABLET | Freq: Every day | ORAL | Status: DC
  Administered 2012-12-05 – 2012-12-12 (×8): 10 mg via ORAL
  Filled 2012-12-04 (×8): qty 1

## 2012-12-04 MED ORDER — GABAPENTIN 400 MG PO CAPS
400.0000 mg | ORAL_CAPSULE | Freq: Three times a day (TID) | ORAL | Status: DC
  Administered 2012-12-05 – 2012-12-12 (×23): 400 mg via ORAL
  Filled 2012-12-04 (×26): qty 1

## 2012-12-04 MED ORDER — FENTANYL CITRATE 0.05 MG/ML IJ SOLN
INTRAMUSCULAR | Status: DC | PRN
  Administered 2012-12-04: 100 ug via INTRAVENOUS
  Administered 2012-12-04: 50 ug via INTRAVENOUS
  Administered 2012-12-04: 100 ug via INTRAVENOUS
  Administered 2012-12-04 (×3): 50 ug via INTRAVENOUS
  Administered 2012-12-04: 100 ug via INTRAVENOUS
  Administered 2012-12-04 (×4): 50 ug via INTRAVENOUS

## 2012-12-04 MED ORDER — AMLODIPINE BESYLATE 10 MG PO TABS
10.0000 mg | ORAL_TABLET | Freq: Every day | ORAL | Status: DC
  Administered 2012-12-05 – 2012-12-08 (×4): 10 mg via ORAL
  Filled 2012-12-04 (×5): qty 1

## 2012-12-04 MED ORDER — PANTOPRAZOLE SODIUM 40 MG PO TBEC
40.0000 mg | DELAYED_RELEASE_TABLET | Freq: Every day | ORAL | Status: DC
  Administered 2012-12-05 – 2012-12-12 (×8): 40 mg via ORAL
  Filled 2012-12-04 (×9): qty 1

## 2012-12-04 MED ORDER — HYDROMORPHONE HCL PF 1 MG/ML IJ SOLN
0.5000 mg | INTRAMUSCULAR | Status: DC | PRN
  Administered 2012-12-04 (×2): 0.5 mg via INTRAVENOUS

## 2012-12-04 MED ORDER — DIPHENHYDRAMINE HCL 50 MG/ML IJ SOLN: 12.5000 mg | Freq: Four times a day (QID) | INTRAMUSCULAR | Status: DC | PRN

## 2012-12-04 MED ORDER — LACTATED RINGERS IV SOLN
INTRAVENOUS | Status: DC | PRN
  Administered 2012-12-04 (×2): via INTRAVENOUS

## 2012-12-04 MED ORDER — NALOXONE HCL 0.4 MG/ML IJ SOLN: 0.4000 mg | INTRAMUSCULAR | Status: DC | PRN

## 2012-12-04 MED ORDER — ACETAMINOPHEN 10 MG/ML IV SOLN
1000.0000 mg | Freq: Four times a day (QID) | INTRAVENOUS | Status: DC
  Administered 2012-12-05 (×3): 1000 mg via INTRAVENOUS
  Filled 2012-12-04 (×4): qty 100

## 2012-12-04 MED ORDER — ONDANSETRON HCL 4 MG/2ML IJ SOLN
4.0000 mg | Freq: Four times a day (QID) | INTRAMUSCULAR | Status: DC | PRN
  Administered 2012-12-05 – 2012-12-08 (×4): 4 mg via INTRAVENOUS
  Filled 2012-12-04 (×4): qty 2

## 2012-12-04 MED ORDER — KCL IN DEXTROSE-NACL 20-5-0.45 MEQ/L-%-% IV SOLN
INTRAVENOUS | Status: DC
  Administered 2012-12-04 – 2012-12-07 (×4): via INTRAVENOUS
  Filled 2012-12-04 (×8): qty 1000

## 2012-12-04 MED ORDER — POTASSIUM CHLORIDE 10 MEQ/50ML IV SOLN
10.0000 meq | Freq: Every day | INTRAVENOUS | Status: DC | PRN
  Administered 2012-12-05 – 2012-12-08 (×10): 10 meq via INTRAVENOUS
  Filled 2012-12-04: qty 100
  Filled 2012-12-04 (×3): qty 50
  Filled 2012-12-04: qty 150
  Filled 2012-12-04 (×2): qty 50

## 2012-12-04 MED ORDER — METOCLOPRAMIDE HCL 5 MG/ML IJ SOLN
INTRAMUSCULAR | Status: AC
  Administered 2012-12-04: 10 mg via INTRAVENOUS
  Filled 2012-12-04: qty 2

## 2012-12-04 MED ORDER — HEMOSTATIC AGENTS (NO CHARGE) OPTIME
TOPICAL | Status: DC | PRN
  Administered 2012-12-04: 1 via TOPICAL

## 2012-12-04 MED ORDER — VECURONIUM BROMIDE 10 MG IV SOLR
INTRAVENOUS | Status: DC | PRN
  Administered 2012-12-04: 2 mg via INTRAVENOUS
  Administered 2012-12-04: 1 mg via INTRAVENOUS
  Administered 2012-12-04: 2 mg via INTRAVENOUS
  Administered 2012-12-04 (×2): 1 mg via INTRAVENOUS

## 2012-12-04 MED ORDER — MIDAZOLAM HCL 2 MG/2ML IJ SOLN
INTRAMUSCULAR | Status: AC
  Administered 2012-12-04: 2 mg via INTRAVENOUS
  Filled 2012-12-04: qty 2

## 2012-12-04 MED ORDER — KCL IN DEXTROSE-NACL 20-5-0.45 MEQ/L-%-% IV SOLN
INTRAVENOUS | Status: AC
  Filled 2012-12-04: qty 1000

## 2012-12-04 MED ORDER — OXYCODONE HCL 5 MG PO TABS
5.0000 mg | ORAL_TABLET | ORAL | Status: AC | PRN
  Administered 2012-12-05: 10 mg via ORAL
  Filled 2012-12-04: qty 2

## 2012-12-04 MED ORDER — BUPIVACAINE 0.5 % ON-Q PUMP SINGLE CATH 400 ML
400.0000 mL | INJECTION | Status: AC
  Administered 2012-12-04: 400 mL
  Filled 2012-12-04: qty 400

## 2012-12-04 MED ORDER — LABETALOL HCL 5 MG/ML IV SOLN
INTRAVENOUS | Status: DC | PRN
  Administered 2012-12-04 (×2): 10 mg via INTRAVENOUS

## 2012-12-04 MED ORDER — SODIUM CHLORIDE 0.9 % IJ SOLN: 9.0000 mL | INTRAMUSCULAR | Status: DC | PRN

## 2012-12-04 MED ORDER — BUPIVACAINE ON-Q PAIN PUMP (FOR ORDER SET NO CHG)
INJECTION | Status: AC
  Filled 2012-12-04: qty 1

## 2012-12-04 MED ORDER — ALBUTEROL SULFATE HFA 108 (90 BASE) MCG/ACT IN AERS
4.0000 | INHALATION_SPRAY | Freq: Four times a day (QID) | RESPIRATORY_TRACT | Status: DC
  Administered 2012-12-05 (×3): 4 via RESPIRATORY_TRACT
  Filled 2012-12-04: qty 6.7

## 2012-12-04 MED ORDER — VANCOMYCIN HCL IN DEXTROSE 1-5 GM/200ML-% IV SOLN
1000.0000 mg | Freq: Two times a day (BID) | INTRAVENOUS | Status: AC
  Administered 2012-12-05: 1000 mg via INTRAVENOUS
  Filled 2012-12-04: qty 200

## 2012-12-04 MED ORDER — BISACODYL 5 MG PO TBEC
10.0000 mg | DELAYED_RELEASE_TABLET | Freq: Every day | ORAL | Status: DC
  Administered 2012-12-05 – 2012-12-11 (×8): 10 mg via ORAL
  Filled 2012-12-04 (×10): qty 2

## 2012-12-04 MED ORDER — HYDROCHLOROTHIAZIDE 25 MG PO TABS
25.0000 mg | ORAL_TABLET | Freq: Every day | ORAL | Status: DC
  Administered 2012-12-05 – 2012-12-08 (×4): 25 mg via ORAL
  Filled 2012-12-04 (×5): qty 1

## 2012-12-04 MED ORDER — METFORMIN HCL 500 MG PO TABS
500.0000 mg | ORAL_TABLET | Freq: Two times a day (BID) | ORAL | Status: DC
  Administered 2012-12-05 – 2012-12-12 (×15): 500 mg via ORAL
  Filled 2012-12-04 (×18): qty 1

## 2012-12-04 MED ORDER — OLMESARTAN-AMLODIPINE-HCTZ 40-10-25 MG PO TABS: 1.0000 | ORAL_TABLET | Freq: Every day | ORAL | Status: DC

## 2012-12-04 MED ORDER — MIDAZOLAM HCL 5 MG/5ML IJ SOLN
INTRAMUSCULAR | Status: DC | PRN
  Administered 2012-12-04: 1 mg via INTRAVENOUS

## 2012-12-04 MED ORDER — SENNOSIDES-DOCUSATE SODIUM 8.6-50 MG PO TABS
1.0000 | ORAL_TABLET | Freq: Every evening | ORAL | Status: DC | PRN
  Administered 2012-12-07 – 2012-12-10 (×2): 1 via ORAL
  Filled 2012-12-04 (×4): qty 1

## 2012-12-04 MED ORDER — PROPOFOL 10 MG/ML IV BOLUS
INTRAVENOUS | Status: DC | PRN
  Administered 2012-12-04: 200 mg via INTRAVENOUS

## 2012-12-04 MED ORDER — FENTANYL CITRATE 0.05 MG/ML IJ SOLN: 50.0000 ug | INTRAMUSCULAR | Status: DC | PRN

## 2012-12-04 MED ORDER — HYDROMORPHONE HCL PF 1 MG/ML IJ SOLN
INTRAMUSCULAR | Status: AC
  Administered 2012-12-04: 0.5 mg via INTRAVENOUS
  Filled 2012-12-04: qty 1

## 2012-12-04 MED ORDER — HYDROMORPHONE HCL PF 1 MG/ML IJ SOLN
0.2500 mg | INTRAMUSCULAR | Status: DC | PRN
  Administered 2012-12-04 (×4): 0.5 mg via INTRAVENOUS

## 2012-12-04 MED ORDER — TRAMADOL HCL 50 MG PO TABS
50.0000 mg | ORAL_TABLET | Freq: Four times a day (QID) | ORAL | Status: DC | PRN
  Administered 2012-12-06: 100 mg via ORAL
  Filled 2012-12-04: qty 2

## 2012-12-04 MED ORDER — DIPHENHYDRAMINE HCL 12.5 MG/5ML PO ELIX
12.5000 mg | ORAL_SOLUTION | Freq: Four times a day (QID) | ORAL | Status: DC | PRN
  Filled 2012-12-04: qty 5

## 2012-12-04 MED ORDER — ONDANSETRON HCL 4 MG/2ML IJ SOLN: 4.0000 mg | Freq: Four times a day (QID) | INTRAMUSCULAR | Status: DC | PRN

## 2012-12-04 MED ORDER — PHENYLEPHRINE HCL 10 MG/ML IJ SOLN
INTRAMUSCULAR | Status: DC | PRN
  Administered 2012-12-04: 80 ug via INTRAVENOUS

## 2012-12-04 MED ORDER — ROCURONIUM BROMIDE 100 MG/10ML IV SOLN
INTRAVENOUS | Status: DC | PRN
  Administered 2012-12-04: 50 mg via INTRAVENOUS

## 2012-12-04 MED ORDER — ASPIRIN EC 81 MG PO TBEC
81.0000 mg | DELAYED_RELEASE_TABLET | Freq: Every day | ORAL | Status: DC
  Administered 2012-12-05 – 2012-12-12 (×8): 81 mg via ORAL
  Filled 2012-12-04 (×8): qty 1

## 2012-12-04 MED ORDER — INSULIN ASPART 100 UNIT/ML ~~LOC~~ SOLN
0.0000 [IU] | Freq: Four times a day (QID) | SUBCUTANEOUS | Status: DC
  Administered 2012-12-05: 2 [IU] via SUBCUTANEOUS
  Administered 2012-12-05 – 2012-12-07 (×2): 4 [IU] via SUBCUTANEOUS
  Administered 2012-12-07 – 2012-12-12 (×6): 2 [IU] via SUBCUTANEOUS

## 2012-12-04 MED ORDER — METOCLOPRAMIDE HCL 5 MG/ML IJ SOLN
10.0000 mg | Freq: Once | INTRAMUSCULAR | Status: AC | PRN
  Administered 2012-12-04: 10 mg via INTRAVENOUS

## 2012-12-04 MED ORDER — 0.9 % SODIUM CHLORIDE (POUR BTL) OPTIME
TOPICAL | Status: DC | PRN
  Administered 2012-12-04: 2000 mL

## 2012-12-04 MED ORDER — ALPRAZOLAM 0.5 MG PO TABS
0.5000 mg | ORAL_TABLET | Freq: Every evening | ORAL | Status: DC | PRN
  Administered 2012-12-05 (×2): 0.5 mg via ORAL
  Filled 2012-12-04 (×3): qty 1

## 2012-12-04 MED ORDER — VANCOMYCIN HCL IN DEXTROSE 1-5 GM/200ML-% IV SOLN
1000.0000 mg | INTRAVENOUS | Status: AC
  Administered 2012-12-04: 1000 mg via INTRAVENOUS
  Filled 2012-12-04: qty 200

## 2012-12-04 MED ORDER — MIDAZOLAM HCL 2 MG/2ML IJ SOLN
1.0000 mg | INTRAMUSCULAR | Status: DC | PRN
  Administered 2012-12-04: 2 mg via INTRAVENOUS

## 2012-12-04 MED ORDER — ONDANSETRON HCL 4 MG/2ML IJ SOLN
INTRAMUSCULAR | Status: DC | PRN
  Administered 2012-12-04: 4 mg via INTRAVENOUS

## 2012-12-04 MED ORDER — OXYCODONE-ACETAMINOPHEN 5-325 MG PO TABS
1.0000 | ORAL_TABLET | ORAL | Status: DC | PRN
  Administered 2012-12-06: 2 via ORAL
  Filled 2012-12-04 (×2): qty 2

## 2012-12-04 SURGICAL SUPPLY — 88 items
APPLICATOR TIP COSEAL (VASCULAR PRODUCTS) ×3 IMPLANT
APPLICATOR TIP EXT COSEAL (VASCULAR PRODUCTS) ×3 IMPLANT
BENZOIN TINCTURE PRP APPL 2/3 (GAUZE/BANDAGES/DRESSINGS) ×3 IMPLANT
BRUSH CYTOL CELLEBRITY 1.5X140 (MISCELLANEOUS) IMPLANT
CANISTER SUCTION 2500CC (MISCELLANEOUS) ×3 IMPLANT
CATH KIT ON Q 5IN SLV (PAIN MANAGEMENT) ×3 IMPLANT
CATH THORACIC 28FR (CATHETERS) ×6 IMPLANT
CATH THORACIC 36FR (CATHETERS) IMPLANT
CATH THORACIC 36FR RT ANG (CATHETERS) IMPLANT
CLIP TI MEDIUM 6 (CLIP) IMPLANT
CLOTH BEACON ORANGE TIMEOUT ST (SAFETY) ×3 IMPLANT
CONN ST 1/4X3/8  BEN (MISCELLANEOUS) ×2
CONN ST 1/4X3/8 BEN (MISCELLANEOUS) ×4 IMPLANT
CONN Y 3/8X3/8X3/8  BEN (MISCELLANEOUS) ×1
CONN Y 3/8X3/8X3/8 BEN (MISCELLANEOUS) ×2 IMPLANT
CONT SPEC 4OZ CLIKSEAL STRL BL (MISCELLANEOUS) ×12 IMPLANT
COVER SURGICAL LIGHT HANDLE (MISCELLANEOUS) ×3 IMPLANT
COVER TABLE BACK 60X90 (DRAPES) ×3 IMPLANT
DRAPE LAPAROSCOPIC ABDOMINAL (DRAPES) ×3 IMPLANT
DRAPE WARM FLUID 44X44 (DRAPE) ×3 IMPLANT
DRILL BIT 7/64X5 (BIT) IMPLANT
ELECT BLADE 4.0 EZ CLEAN MEGAD (MISCELLANEOUS) ×3
ELECT REM PT RETURN 9FT ADLT (ELECTROSURGICAL) ×3
ELECTRODE BLDE 4.0 EZ CLN MEGD (MISCELLANEOUS) ×2 IMPLANT
ELECTRODE REM PT RTRN 9FT ADLT (ELECTROSURGICAL) ×2 IMPLANT
FORCEPS BIOP RJ4 1.8 (CUTTING FORCEPS) IMPLANT
GLOVE BIO SURGEON STRL SZ 6 (GLOVE) ×3 IMPLANT
GLOVE BIO SURGEON STRL SZ 6.5 (GLOVE) ×9 IMPLANT
GLOVE BIOGEL PI IND STRL 6.5 (GLOVE) ×2 IMPLANT
GLOVE BIOGEL PI IND STRL 7.0 (GLOVE) ×2 IMPLANT
GLOVE BIOGEL PI INDICATOR 6.5 (GLOVE) ×1
GLOVE BIOGEL PI INDICATOR 7.0 (GLOVE) ×1
GLOVE ECLIPSE 6.5 STRL STRAW (GLOVE) ×6 IMPLANT
GOWN STRL NON-REIN LRG LVL3 (GOWN DISPOSABLE) ×15 IMPLANT
HANDLE STAPLE ENDO GIA SHORT (STAPLE) ×2
HANDLE UNIV ENDO GIA (ENDOMECHANICALS) IMPLANT
KIT BASIN OR (CUSTOM PROCEDURE TRAY) ×3 IMPLANT
KIT ROOM TURNOVER OR (KITS) ×3 IMPLANT
KIT SUCTION CATH 14FR (SUCTIONS) ×3 IMPLANT
MARKER SKIN DUAL TIP RULER LAB (MISCELLANEOUS) ×3 IMPLANT
NEEDLE BIOPSY TRANSBRONCH 21G (NEEDLE) IMPLANT
NS IRRIG 1000ML POUR BTL (IV SOLUTION) ×6 IMPLANT
OIL SILICONE PENTAX (PARTS (SERVICE/REPAIRS)) ×3 IMPLANT
PACK CHEST (CUSTOM PROCEDURE TRAY) ×3 IMPLANT
PAD ARMBOARD 7.5X6 YLW CONV (MISCELLANEOUS) ×6 IMPLANT
RELOAD EGIA 45 MED/THCK PURPLE (STAPLE) ×6 IMPLANT
RELOAD EGIA 45 TAN VASC (STAPLE) ×12 IMPLANT
RELOAD EGIA 60 MED/THCK PURPLE (STAPLE) ×27 IMPLANT
RELOAD EGIA 60 TAN VASC (STAPLE) ×3 IMPLANT
RELOAD EGIA BLACK ROTIC 45MM (STAPLE) ×3 IMPLANT
RELOAD EGIA TRIS TAN 45 CVD (STAPLE) ×6 IMPLANT
RELOAD TRI 2.0 60 XTHK VAS SUL (STAPLE) ×6 IMPLANT
SCISSORS LAP 5X35 DISP (ENDOMECHANICALS) IMPLANT
SEALANT PROGEL (MISCELLANEOUS) IMPLANT
SEALANT SURG COSEAL 4ML (VASCULAR PRODUCTS) ×3 IMPLANT
SEALANT SURG COSEAL 8ML (VASCULAR PRODUCTS) ×3 IMPLANT
SOLUTION ANTI FOG 6CC (MISCELLANEOUS) ×3 IMPLANT
SPONGE GAUZE 4X4 12PLY (GAUZE/BANDAGES/DRESSINGS) ×3 IMPLANT
STAPLER ENDO GIA 12MM SHORT (STAPLE) ×4 IMPLANT
SUT PROLENE 3 0 SH DA (SUTURE) IMPLANT
SUT PROLENE 4 0 RB 1 (SUTURE)
SUT PROLENE 4-0 RB1 .5 CRCL 36 (SUTURE) IMPLANT
SUT SILK  1 MH (SUTURE) ×2
SUT SILK 1 MH (SUTURE) ×4 IMPLANT
SUT SILK 2 0SH CR/8 30 (SUTURE) ×3 IMPLANT
SUT SILK 3 0SH CR/8 30 (SUTURE) IMPLANT
SUT STEEL 1 (SUTURE) IMPLANT
SUT VIC AB 1 CTX 18 (SUTURE) ×3 IMPLANT
SUT VIC AB 1 CTX 36 (SUTURE)
SUT VIC AB 1 CTX36XBRD ANBCTR (SUTURE) IMPLANT
SUT VIC AB 2-0 CTX 36 (SUTURE) ×3 IMPLANT
SUT VIC AB 2-0 UR6 27 (SUTURE) ×3 IMPLANT
SUT VIC AB 3-0 SH 8-18 (SUTURE) ×3 IMPLANT
SUT VIC AB 3-0 X1 27 (SUTURE) ×6 IMPLANT
SUT VICRYL 2 TP 1 (SUTURE) ×3 IMPLANT
SWAB COLLECTION DEVICE MRSA (MISCELLANEOUS) IMPLANT
SYR 20ML ECCENTRIC (SYRINGE) ×3 IMPLANT
SYSTEM SAHARA CHEST DRAIN ATS (WOUND CARE) ×3 IMPLANT
TAPE UMBILICAL COTTON 1/8X30 (MISCELLANEOUS) ×3 IMPLANT
TIP APPLICATOR SPRAY EXTEND 16 (VASCULAR PRODUCTS) IMPLANT
TOWEL OR 17X24 6PK STRL BLUE (TOWEL DISPOSABLE) ×6 IMPLANT
TOWEL OR 17X26 10 PK STRL BLUE (TOWEL DISPOSABLE) ×6 IMPLANT
TRAP SPECIMEN MUCOUS 40CC (MISCELLANEOUS) ×3 IMPLANT
TRAY FOLEY CATH 14FRSI W/METER (CATHETERS) ×3 IMPLANT
TUBE ANAEROBIC SPECIMEN COL (MISCELLANEOUS) IMPLANT
TUBE CONNECTING 12X1/4 (SUCTIONS) ×3 IMPLANT
TUNNELER SHEATH ON-Q 11GX8 (MISCELLANEOUS) ×3 IMPLANT
WATER STERILE IRR 1000ML POUR (IV SOLUTION) ×6 IMPLANT

## 2012-12-04 NOTE — Anesthesia Procedure Notes (Signed)
Procedure Name: Intubation Date/Time: 12/04/2012 3:01 PM Performed by: Hermelinda Dellen A Pre-anesthesia Checklist: Patient identified, Timeout performed, Emergency Drugs available, Suction available and Patient being monitored Patient Re-evaluated:Patient Re-evaluated prior to inductionOxygen Delivery Method: Circle system utilized Preoxygenation: Pre-oxygenation with 100% oxygen Intubation Type: IV induction Ventilation: Mask ventilation without difficulty Laryngoscope Size: Mac and 3 Grade View: Grade I Endobronchial tube: Left, Double lumen EBT, EBT position confirmed by fiberoptic bronchoscope and EBT position confirmed by auscultation and 37 Fr Number of attempts: 1 Airway Equipment and Method: Stylet and Fiberoptic brochoscope Placement Confirmation: ETT inserted through vocal cords under direct vision,  positive ETCO2 and breath sounds checked- equal and bilateral Secured at: 28 cm Tube secured with: Tape Dental Injury: Teeth and Oropharynx as per pre-operative assessment

## 2012-12-04 NOTE — OR Nursing (Signed)
Video Assisted Thoracoscopy started at 1529

## 2012-12-04 NOTE — Anesthesia Postprocedure Evaluation (Signed)
  Anesthesia Post-op Note  Patient: [name] [name]  Procedure(s) Performed: Procedure(s) (LRB) with comments: VIDEO BRONCHOSCOPY (N/A) VIDEO ASSISTED THORACOSCOPY (VATS)/WEDGE RESECTION (Right) LOBECTOMY (Right) - completion of right upper lobectomy and lymph node disection, placement of on q pump  Patient Location: PACU  Anesthesia Type:General  Level of Consciousness: awake, alert , oriented and patient cooperative  Airway and Oxygen Therapy: Patient Spontanous Breathing and Patient connected to nasal cannula oxygen  Post-op Pain: mild  Post-op Assessment: Post-op Vital signs reviewed, Patient's Cardiovascular Status Stable, Respiratory Function Stable, Patent Airway, No signs of Nausea or vomiting and Pain level controlled  Post-op Vital Signs: Reviewed and stable  Complications: No apparent anesthesia complications

## 2012-12-04 NOTE — H&P (Signed)
[address]            [place] [address]          [phone]        Beauty Pless [facility] Medical Record #[id number] Date of Birth: May 03, 1951  Referring:Dr Clance Primary Care: [name], MD  Chief Complaint:    Right lung mass   History of Present Illness:    Patient referred because of enlarging rt lung nodule. Has been long term smoker, denies any hemoptysis.      Current Activity/ Functional Status: [name] independent with mobility/ambulation, transfers, ADL's, IADL's.   Past Medical History  Diagnosis Date  . Hypertension   . Diabetes mellitus   . Bipolar 1 disorder   . Heart murmur   . Sciatica   . Arthritis   . Hepatitis     hep c ?, pt states thinks  . Anxiety   . Diverticulosis    Past Surgical History  Procedure Date  . Brain surgery  meningioma resected in [facility] April 2009   . Facial tumor removal   . Brain tumor removal      Family History  Problem Relation Age of Onset  . Hypertension Father     deceased  . Diabetes Father   . Heart disease Father   . Hyperlipidemia Father   . Hypertension Mother   . Hyperlipidemia Mother   . Diabetes Mother   . Hypertension Sister   . Hyperlipidemia Sister   . Hypertension Brother   . Hyperlipidemia Brother     History   Social History  . Marital Status: Widowed    Spouse Name: N/A    Number of Children: N/A  . Years of Education: N/A   Occupational History  . n/a     patient draws SNN/SSI   Social History Main Topics  . Smoking status:  patient continues to smoke daily and has since 1980 use a third to half a pack per day     Types: Cigarettes  . Smokeless tobacco: Never Used     Comment: Smokes 1/2 pack per week  . Alcohol Use: No  . Drug Use: No  . Sexually Active: Not on file                 History  Smoking status  . Current Some Day Smoker -- 10 years  . Types: Cigarettes  Smokeless  tobacco  . Never Used    Comment: Smokes 1/2 pack per week    History  Alcohol Use No    Comment: quit 66yrs ago     Allergies  Allergen Reactions  . Haldol (Haloperidol Decanoate) Other (See Comments)    tongue sweeling  . Morphine And Related Hives    hives  . Penicillins Rash    Current Facility-Administered Medications  Medication Dose Route Frequency Provider Last Rate Last Dose  . fentaNYL (SUBLIMAZE) 0.05 MG/ML injection           . fentaNYL (SUBLIMAZE) injection 50-100 mcg  50-100 mcg Intravenous PRN Hart Robinsons, MD      . midazolam (VERSED) 2 MG/2ML injection           . midazolam (VERSED) injection 1-2 mg  1-2 mg Intravenous PRN Hart Robinsons, MD      .  vancomycin (VANCOCIN) IVPB 1000 mg/200 mL premix  1,000 mg Intravenous On Call to OR Delight Ovens, MD      . vancomycin (VANCOCIN) IVPB 1000 mg/200 mL premix  1,000 mg Intravenous On Call to OR Elwin Sleight, PHARMD           Review of Systems:     Cardiac Review of Systems: Y or N  Chest Pain [  n  ]  Resting SOB [ n  ] Exertional SOB  [n  ]  Orthopnea [ n ]   Pedal Edema [n   ]    Palpitations [ y ] Syncope  [n  ]   Presyncope [n   ]  General Review of Systems: [Y] = yes [  ]=no Constitional: recent weight change [  ]; anorexia [  ]; fatigue [  ]; nausea [  ]; night sweats [  ]; fever [  ]; or chills [  ];                                                                                                                                          Dental: poor dentition[ dentures ]; Last Dentist visit: dentures  Eye : blurred vision [  ]; diplopia [   ]; vision changes [  ];  Amaurosis fugax[  ]; Resp: cough [  ];  wheezing[ n ];  hemoptysis[ n ]; shortness of breath[  ]; paroxysmal nocturnal dyspnea[  ]; dyspnea on exertion[  ]; or orthopnea[  ];  GI:  gallstones[  ], vomiting[  ];  dysphagia[  ]; melena[  ];  hematochezia [  ]; heartburn[  ];   Hx of  Colonoscopy[y 3 years ago  ]; GU: kidney stones [   ]; hematuria[  ];   dysuria [  ];  nocturia[  ];  history of     obstruction [  ];             Skin: rash, swelling[  ];, hair loss[  ];  peripheral edema[  ];  or itching[  ]; Musculosketetal: myalgias[  ];  joint swelling[  ];  joint erythema[  ];  joint pain[  ];  back pain[ y ];  Heme/Lymph: bruising[  ];  bleeding[  ];  anemia[  ];  Neuro: TIA[ n ];  headaches[n  ];  stroke[ n ];  vertigo[ n ];  seizures[  ];   paresthesias[  ];  difficulty walking[ some left leg and left arm weakness ];  Psych:depression[  ]; anxiety[  ];  Endocrine: diabetes[y  ];  thyroid dysfunction[ n ];  Immunizations: Flu Milo.Brash  ]; Pneumococcal[n  ];  Other:  Physical Exam: BP 121/77  Pulse 101  Temp 98.3 F (36.8 C) (Oral)  Resp 20  SpO2 95%  General appearance: alert and cooperative Neurologic: intact Heart: regular rate and rhythm, S1,  S2 normal, no murmur, click, rub or gallop and normal apical impulse Lungs: clear to auscultation bilaterally and normal percussion bilaterally Abdomen: soft, non-tender; bowel sounds normal; no masses,  no organomegaly Extremities: extremities normal, atraumatic, no cyanosis or edema Wound:    Diagnostic Studies & Laboratory data:     Recent Radiology Findings:  Dg Hip Complete Left  11/08/2012  *RADIOLOGY REPORT*  Clinical Data: Left hip, leg, and back pain  LEFT HIP - COMPLETE 2+ VIEW  Comparison: None  Findings: Osseous demineralization. Symmetric preserved hip and SI joints. No acute fracture, dislocation, or bone destruction.  IMPRESSION: No acute osseous abnormalities.   Original Report Authenticated By: Ulyses Southward, M.D.    Nm Pet Image Initial (pi) Skull Base To Thigh  11/21/2012  *RADIOLOGY REPORT*  Clinical Data: Initial treatment strategy for right upper lobe lung mass.  NUCLEAR MEDICINE PET SKULL BASE TO THIGH  Fasting Blood Glucose:  108  Technique:  18.2 mCi F-18 FDG was injected intravenously. CT data was obtained and used for attenuation correction and  anatomic localization only.  (This was not acquired as a diagnostic CT examination.) Additional exam technical data entered on technologist worksheet.  Comparison:  The CT chest 10/08/2012  Findings:  Neck: No hypermetabolic lymph nodes in the neck.  Chest:  The lobulated pulmonary nodule in the right upper lobe measuring 20 mm x 20 mm has low metabolic activity with SUV max = 2.2.  No additional suspicious pulmonary nodules are present. There are no hypermetabolic mediastinal lymph nodes.  Abdomen/Pelvis:  No abnormal hypermetabolic activity within the liver, pancreas, adrenal glands, or spleen.  No hypermetabolic lymph nodes in the abdomen or pelvis.  Skeleton:  No focal hypermetabolic activity to suggest skeletal metastasis.  IMPRESSION:  1.  Low metabolic activity of the right upper lobe pulmonary nodule is atypical for a primary bronchogenic carcinoma.  Due to the size of the lesion, recommend percutaneous biopsy as the lesion remains suspicious for neoplasm and may represent a low grade adenocarcinoma. 2.  No hypermetabolic mediastinal adenopathy or distant metastasis.   Original Report Authenticated By: Genevive Bi, M.D.     Patient has chest x-rays back to 5 2012 did show progressive enlargement of a right upper lobe lung nodule.  Recent Lab Findings: Lab Results  Component Value Date   WBC 6.0 12/03/2012   HGB 13.2 12/03/2012   HCT 40.3 12/03/2012   PLT 225 12/03/2012   GLUCOSE 147* 12/03/2012   ALT 14 12/03/2012   AST 19 12/03/2012   NA 139 12/03/2012   K 3.3* 12/03/2012   CL 101 12/03/2012   CREATININE 0.53 12/03/2012   BUN 22 12/03/2012   CO2 24 12/03/2012   INR 1.00 12/03/2012   PFTS FEV1 1.95 89% DLCO 15.9 77%   Assessment / Plan:     Slowly enlarging right upper lobe lung nodule with low metabolic activity on PET scan, but with increasing size over time in a smoker still suspicious for low-grade adenocarcinoma. With the patient's positive smoking history and documented enlarging right  upper lobe lung nodule I recommended to the patient that we proceed with surgical resection even without any prior needle biopsy. Surgical procedure of right video-assisted thoracoscopy possible thoracotomy and lung resection is discussed in detail with the patient including the risks of surgery. We also discussed the treatment options including further observation, I have not encourage this with her because it's been documented the lesion is enlarging.  She is willing to proceed today.  The  goals risks and alternatives of the planned surgical procedure Bronchoscopy and rt lung resection  have been discussed with the patient in detail. The risks of the procedure including death, infection, stroke, myocardial infarction, bleeding, blood transfusion have all been discussed specifically.  I have quoted Maureen Chatters a 5 % of perioperative mortality and a complication rate as high as 20 %. The patient's questions have been answered.Dennis Killilea is willing  to proceed with the planned procedure.    Delight Ovens MD  Beeper [phone] Office [phone] 12/04/2012 1:42 PM

## 2012-12-04 NOTE — Progress Notes (Signed)
Nurse in to check on patient. Patient was ambulating in room, and when Nurse asked patient about blood sugar she stated "my blood sugar is 98." Nurse told patient that her last blood sugar was 78 and patient then informed Nurse that she was beginning to feel "shakey." Nurse then called Dr. Gelene Mink in Anesthesia and notified him of this. Dr. Gelene Mink stated to send patient to holding area and they would start the IV over there.

## 2012-12-04 NOTE — Transfer of Care (Signed)
Immediate Anesthesia Transfer of Care Note  Patient: [name] [name]  Procedure(s) Performed: Procedure(s) (LRB) with comments: VIDEO BRONCHOSCOPY (N/A) VIDEO ASSISTED THORACOSCOPY (VATS)/WEDGE RESECTION (Right) LOBECTOMY (Right) - completion of right upper lobectomy and lymph node disection, placement of on q pump  Patient Location: PACU  Anesthesia Type:General  Level of Consciousness: awake, alert , oriented and patient cooperative  Airway & Oxygen Therapy: Patient Spontanous Breathing and Patient connected to face mask oxygen  Post-op Assessment: Report given to PACU RN, Post -op Vital signs reviewed and stable and Patient moving all extremities  Post vital signs: Reviewed and stable  Complications: No apparent anesthesia complications

## 2012-12-04 NOTE — OR Nursing (Addendum)
Procedure 1 Bronchoscopy start at 1450, end at 1455.

## 2012-12-04 NOTE — Anesthesia Preprocedure Evaluation (Signed)
Anesthesia Evaluation  Patient identified by MRN, date of birth, ID band Patient awake    Reviewed: Allergy & Precautions, H&P , NPO status , Patient's Chart, lab work & pertinent test results, reviewed documented beta blocker date and time   Airway Mallampati: II TM Distance: >3 FB Neck ROM: full    Dental   Pulmonary COPD COPD inhaler, Current Smoker,  breath sounds clear to auscultation        Cardiovascular hypertension, On Medications and On Home Beta Blockers + Valvular Problems/Murmurs Rhythm:regular     Neuro/Psych PSYCHIATRIC DISORDERS  Neuromuscular disease    GI/Hepatic negative GI ROS, (+) Hepatitis -, Unspecified  Endo/Other  diabetes, Oral Hypoglycemic Agents  Renal/GU negative Renal ROS  negative genitourinary   Musculoskeletal   Abdominal   Peds  Hematology negative hematology ROS (+)   Anesthesia Other Findings See surgeon's H&P   Reproductive/Obstetrics negative OB ROS                           Anesthesia Physical Anesthesia Plan  ASA: III  Anesthesia Plan: General   Post-op Pain Management:    Induction: Intravenous  Airway Management Planned: Double Lumen EBT  Additional Equipment: Arterial line, CVP and Ultrasound Guidance Line Placement  Intra-op Plan:   Post-operative Plan: Extubation in OR  Informed Consent: I have reviewed the patients History and Physical, chart, labs and discussed the procedure including the risks, benefits and alternatives for the proposed anesthesia with the patient or authorized representative who has indicated his/her understanding and acceptance.   Dental Advisory Given  Plan Discussed with: CRNA and Surgeon  Anesthesia Plan Comments:         Anesthesia Quick Evaluation

## 2012-12-04 NOTE — Preoperative (Signed)
Beta Blockers   Reason not to administer Beta Blockers:Not Applicable 

## 2012-12-04 NOTE — Brief Op Note (Signed)
12/04/2012  7:03 PM  PATIENT:  [name] [name]  62 y.o. female  PRE-OPERATIVE DIAGNOSIS:  Right upper lobe lung mass  POST-OPERATIVE DIAGNOSIS:  Colloid carcinoma, right upper lobe  PROCEDURE:   VIDEO BRONCHOSCOPY RIGHT VIDEO ASSISTED THORACOSCOPY/MINI THORACOTOMY, RIGHT UPPER LOBECTOMY, LYMPH NODE DISSECTION  SURGEON:  Surgeon(s): Delight Ovens, MD  ASSISTANT: Coral Ceo, PA-C  ANESTHESIA:   general  SPECIMEN:  Source of Specimen:  right upper lobe, lymph nodes  DISPOSITION OF SPECIMEN:  Pathology  DRAINS: 28 Fr CTs x 2  PATIENT CONDITION:  PACU - hemodynamically stable.

## 2012-12-05 ENCOUNTER — Inpatient Hospital Stay (HOSPITAL_COMMUNITY): Payer: Medicaid Other

## 2012-12-05 LAB — GLUCOSE, CAPILLARY
Glucose-Capillary: 108 mg/dL — ABNORMAL HIGH (ref 70–99)
Glucose-Capillary: 118 mg/dL — ABNORMAL HIGH (ref 70–99)
Glucose-Capillary: 153 mg/dL — ABNORMAL HIGH (ref 70–99)
Glucose-Capillary: 185 mg/dL — ABNORMAL HIGH (ref 70–99)

## 2012-12-05 LAB — CBC
MCH: 29.2 pg (ref 26.0–34.0)
Platelets: 218 10*3/uL (ref 150–400)
RBC: 4.14 MIL/uL (ref 3.87–5.11)
WBC: 14.1 10*3/uL — ABNORMAL HIGH (ref 4.0–10.5)

## 2012-12-05 LAB — POCT I-STAT 3, ART BLOOD GAS (G3+)
Acid-Base Excess: 3 mmol/L — ABNORMAL HIGH (ref 0.0–2.0)
Bicarbonate: 28.5 mEq/L — ABNORMAL HIGH (ref 20.0–24.0)
O2 Saturation: 86 %
Patient temperature: 98.6
TCO2: 30 mmol/L (ref 0–100)
pCO2 arterial: 46.4 mmHg — ABNORMAL HIGH (ref 35.0–45.0)
pH, Arterial: 7.397 (ref 7.350–7.450)
pO2, Arterial: 52 mmHg — ABNORMAL LOW (ref 80.0–100.0)

## 2012-12-05 LAB — BASIC METABOLIC PANEL
CO2: 28 mEq/L (ref 19–32)
Calcium: 9.2 mg/dL (ref 8.4–10.5)
Chloride: 103 mEq/L (ref 96–112)
Potassium: 3.2 mEq/L — ABNORMAL LOW (ref 3.5–5.1)
Sodium: 141 mEq/L (ref 135–145)

## 2012-12-05 MED ORDER — PNEUMOCOCCAL VAC POLYVALENT 25 MCG/0.5ML IJ INJ: 0.5000 mL | INJECTION | INTRAMUSCULAR | Status: DC | PRN

## 2012-12-05 MED ORDER — FENTANYL CITRATE 0.05 MG/ML IJ SOLN
INTRAMUSCULAR | Status: AC
  Filled 2012-12-05: qty 2

## 2012-12-05 MED ORDER — HYDROCODONE-ACETAMINOPHEN 10-325 MG PO TABS
1.0000 | ORAL_TABLET | Freq: Four times a day (QID) | ORAL | Status: DC | PRN
  Administered 2012-12-05 – 2012-12-06 (×3): 1 via ORAL
  Filled 2012-12-05 (×3): qty 1

## 2012-12-05 MED ORDER — PROMETHAZINE HCL 25 MG/ML IJ SOLN: 12.5000 mg | Freq: Four times a day (QID) | INTRAMUSCULAR | Status: DC | PRN

## 2012-12-05 MED ORDER — KETOROLAC TROMETHAMINE 30 MG/ML IJ SOLN
30.0000 mg | Freq: Four times a day (QID) | INTRAMUSCULAR | Status: DC | PRN
  Filled 2012-12-05: qty 1

## 2012-12-05 MED ORDER — FENTANYL CITRATE 0.05 MG/ML IJ SOLN: 25.0000 ug | Freq: Once | INTRAMUSCULAR | Status: DC

## 2012-12-05 MED ORDER — FENTANYL CITRATE 0.05 MG/ML IJ SOLN
25.0000 ug | INTRAMUSCULAR | Status: DC | PRN
  Administered 2012-12-05 – 2012-12-06 (×3): 25 ug via INTRAVENOUS
  Filled 2012-12-05 (×4): qty 2

## 2012-12-05 MED ORDER — ALBUTEROL SULFATE HFA 108 (90 BASE) MCG/ACT IN AERS
4.0000 | INHALATION_SPRAY | Freq: Four times a day (QID) | RESPIRATORY_TRACT | Status: DC | PRN
  Filled 2012-12-05: qty 6.7

## 2012-12-05 NOTE — Progress Notes (Addendum)
1 Day Post-Op Procedure(s) (LRB): VIDEO BRONCHOSCOPY (N/A) VIDEO ASSISTED THORACOSCOPY (VATS)/WEDGE RESECTION (Right) LOBECTOMY (Right) Subjective:  Ms. Blinder complains of some pain this morning.   She also complains of her PCA and not liking the monitor in her nose.   Objective: Vital signs in last 24 hours: Temp:  [96.1 F (35.6 C)-99.2 F (37.3 C)] 98.4 F (36.9 C) (01/16 0815) Pulse Rate:  [77-102] 83  (01/16 0700) Cardiac Rhythm:  [-] Sinus tachycardia (01/16 0100) Resp:  [11-23] 18  (01/16 0726) BP: (109-138)/(59-98) 111/63 mmHg (01/16 0700) SpO2:  [95 %-100 %] 100 % (01/16 0740) Arterial Line BP: (121-163)/(58-88) 147/62 mmHg (01/16 0700) Weight:  [153 lb 10.6 oz (69.7 kg)-158 lb (71.668 kg)] 153 lb 10.6 oz (69.7 kg) (01/16 0600)  Intake/Output from previous day: 01/15 0701 - 01/16 0700 In: 3265 [P.O.:540; I.V.:2225; IV Piggyback:500] Out: 2635 [Urine:2055; Blood:200; Chest Tube:380]  General appearance: alert, cooperative and no distress Heart: regular rate and rhythm Lungs: diminished breath sounds on the right Abdomen: soft, non-tender; bowel sounds normal; no masses,  no organomegaly Wound: clean and dry  Lab Results:  Basename 12/05/12 0345 12/03/12 1127  WBC 14.1* 6.0  HGB 12.1 13.2  HCT 36.3 40.3  PLT 218 225   BMET:  Basename 12/05/12 0345 12/03/12 1127  NA 141 139  K 3.2* 3.3*  CL 103 101  CO2 28 24  GLUCOSE 131* 147*  BUN 13 22  CREATININE 0.54 0.53  CALCIUM 9.2 9.2    PT/INR:  Basename 12/03/12 1127  LABPROT 13.1  INR 1.00   ABG    Component Value Date/Time   PHART 7.397 12/05/2012 0347   HCO3 28.5* 12/05/2012 0347   TCO2 30 12/05/2012 0347   O2SAT 86.0 12/05/2012 0347   CBG (last 3)   Basename 12/05/12 0557 12/05/12 0034 12/04/12 1951  GLUCAP 108* 185* 162*    Assessment/Plan: S/P Procedure(s) (LRB): VIDEO BRONCHOSCOPY (N/A) VIDEO ASSISTED THORACOSCOPY (VATS)/WEDGE RESECTION (Right) LOBECTOMY (Right)  1. Chest tube- + air  leak on suction, 380cc output today- will leave in place 2. Pulm- aggressive IS, wean oxygen as tolerated, no pneumothorax 3. Renal- creatinine WNL, good U/O, D/C Foley 4. Pain control- d/c PCA, will start Toradol, continue Percocet 5. Hyopkalemia- receiving IV replacement 6. D/C Arterial Line 7. Dispo- continue current care, CXR in AM, repeat BMET, CBC, Decrease IV Fluids    LOS: 1 day    BARRETT, ERIN 12/05/2012  Good resp effort Small airleak with cough Chest xray stable I have seen and examined Maureen Chatters and agree with the above assessment  and plan.  Delight Ovens MD Beeper [phone] Office [phone] 12/05/2012 8:47 AM

## 2012-12-05 NOTE — Progress Notes (Signed)
Patient received from 2300 via wheelchair. Patient transferred with min. Assistance. Settled into the room and oriented to the unit. Vitals obtained,resting comfortably.

## 2012-12-05 NOTE — Progress Notes (Signed)
Chaplain visited patient after she requested through her nurse for a Chaplain. Patient was recovering from heart surgery. Patient was awake and responsive. Patient expressed her gratitude to God for the success of the surgery and her gradual recovery. Chaplain provided ministry of empathic listening and presence. Chaplain shared words of hope, encouragement and prayed with patient. Chaplain will follow-up as needed. Patient expressed her appreciation for Chaplain's spiritual support, presence and visit.

## 2012-12-05 NOTE — ED Provider Notes (Signed)
Medical screening examination/treatment/procedure(s) were performed by non-physician practitioner and as supervising physician I was immediately available for consultation/collaboration.    Celene Kras, MD 12/05/12 [phone]

## 2012-12-05 NOTE — Progress Notes (Signed)
50 ml of Fentanyl PCA wasted after PCA d/c. Witnessed by Andreas Ohm, RN.

## 2012-12-05 NOTE — Progress Notes (Signed)
K 3.2, Cr 0.54, UOP>20cc/hr.  TCTS potassium protocol initiated.

## 2012-12-06 ENCOUNTER — Encounter (HOSPITAL_COMMUNITY): Payer: Self-pay | Admitting: Cardiothoracic Surgery

## 2012-12-06 ENCOUNTER — Inpatient Hospital Stay (HOSPITAL_COMMUNITY): Payer: Medicaid Other

## 2012-12-06 LAB — TYPE AND SCREEN
ABO/RH(D): O POS
Antibody Screen: NEGATIVE
Unit division: 0
Unit division: 0

## 2012-12-06 LAB — COMPREHENSIVE METABOLIC PANEL
ALT: 19 U/L (ref 0–35)
Albumin: 3.4 g/dL — ABNORMAL LOW (ref 3.5–5.2)
Alkaline Phosphatase: 61 U/L (ref 39–117)
Calcium: 9.2 mg/dL (ref 8.4–10.5)
Potassium: 3.5 mEq/L (ref 3.5–5.1)
Sodium: 139 mEq/L (ref 135–145)
Total Protein: 6.6 g/dL (ref 6.0–8.3)

## 2012-12-06 LAB — GLUCOSE, CAPILLARY
Glucose-Capillary: 120 mg/dL — ABNORMAL HIGH (ref 70–99)
Glucose-Capillary: 132 mg/dL — ABNORMAL HIGH (ref 70–99)
Glucose-Capillary: 119 mg/dL — ABNORMAL HIGH (ref 70–99)
Glucose-Capillary: 138 mg/dL — ABNORMAL HIGH (ref 70–99)

## 2012-12-06 LAB — CBC
MCHC: 32.9 g/dL (ref 30.0–36.0)
Platelets: 220 10*3/uL (ref 150–400)
RDW: 13.7 % (ref 11.5–15.5)

## 2012-12-06 MED ORDER — KETOROLAC TROMETHAMINE 30 MG/ML IJ SOLN
15.0000 mg | Freq: Four times a day (QID) | INTRAMUSCULAR | Status: AC | PRN
  Administered 2012-12-05: 30 mg via INTRAVENOUS
  Administered 2012-12-06 – 2012-12-10 (×9): 15 mg via INTRAVENOUS
  Filled 2012-12-06 (×10): qty 1

## 2012-12-06 MED ORDER — HYDROCODONE-ACETAMINOPHEN 10-325 MG PO TABS
1.0000 | ORAL_TABLET | ORAL | Status: DC | PRN
  Administered 2012-12-06 – 2012-12-12 (×29): 1 via ORAL
  Filled 2012-12-06 (×29): qty 1

## 2012-12-06 MED ORDER — ENOXAPARIN SODIUM 30 MG/0.3ML ~~LOC~~ SOLN
30.0000 mg | SUBCUTANEOUS | Status: DC
  Administered 2012-12-06 – 2012-12-11 (×6): 30 mg via SUBCUTANEOUS
  Filled 2012-12-06 (×7): qty 0.3

## 2012-12-06 MED ORDER — ALPRAZOLAM 0.5 MG PO TABS
0.5000 mg | ORAL_TABLET | Freq: Three times a day (TID) | ORAL | Status: DC | PRN
  Administered 2012-12-06 – 2012-12-11 (×15): 0.5 mg via ORAL
  Filled 2012-12-06 (×16): qty 1

## 2012-12-06 MED ORDER — POLYETHYLENE GLYCOL 3350 17 G PO PACK
17.0000 g | PACK | Freq: Every day | ORAL | Status: DC
  Administered 2012-12-06 – 2012-12-11 (×6): 17 g via ORAL
  Filled 2012-12-06 (×7): qty 1

## 2012-12-06 NOTE — Progress Notes (Addendum)
2 Days Post-Op Procedure(s) (LRB): VIDEO BRONCHOSCOPY (N/A) VIDEO ASSISTED THORACOSCOPY (VATS)/WEDGE RESECTION (Right) LOBECTOMY (Right) Subjective:  No new complaints.    Objective: Vital signs in last 24 hours: Temp:  [98.3 F (36.8 C)-99.5 F (37.5 C)] 99.5 F (37.5 C) (01/17 0700) Pulse Rate:  [76-102] 102  (01/17 0700) Cardiac Rhythm:  [-] Normal sinus rhythm (01/17 0757) Resp:  [14-23] 14  (01/17 0700) BP: (109-159)/(59-87) 126/65 mmHg (01/17 0700) SpO2:  [96 %-100 %] 97 % (01/17 0700)  Intake/Output from previous day: 01/16 0701 - 01/17 0700 In: 1265.8 [P.O.:420; I.V.:795.8; IV Piggyback:50] Out: 2010 [Urine:1795; Chest Tube:215] Intake/Output this shift: Total I/O In: 100 [I.V.:100] Out: 60 [Chest Tube:60]  General appearance: alert, cooperative and no distress Heart: regular rate and rhythm Lungs: clear to auscultation bilaterally Abdomen: soft, non-tender; bowel sounds normal; no masses,  no organomegaly Wound: clean and dry  Lab Results:  Basename 12/06/12 0405 12/05/12 0345  WBC 9.0 14.1*  HGB 11.9* 12.1  HCT 36.2 36.3  PLT 220 218   BMET:  Basename 12/06/12 0405 12/05/12 0345  NA 139 141  K 3.5 3.2*  CL 101 103  CO2 29 28  GLUCOSE 103* 131*  BUN 10 13  CREATININE 0.55 0.54  CALCIUM 9.2 9.2    PT/INR:  Basename 12/03/12 1127  LABPROT 13.1  INR 1.00   ABG    Component Value Date/Time   PHART 7.397 12/05/2012 0347   HCO3 28.5* 12/05/2012 0347   TCO2 30 12/05/2012 0347   O2SAT 86.0 12/05/2012 0347   CBG (last 3)   Basename 12/06/12 0545 12/06/12 0007 12/05/12 1709  GLUCAP 120* 132* 118*    Assessment/Plan: S/P Procedure(s) (LRB): VIDEO BRONCHOSCOPY (N/A) VIDEO ASSISTED THORACOSCOPY (VATS)/WEDGE RESECTION (Right) LOBECTOMY (Right)  1. Chest tubes in place- put on water seal this morning will repeat CXR in AM 2. Pain control- improved with use of Norco 3. Pulm- no acute issues, + cough, will wean oxygen as tolerated 4. Hypokalemia-  receiving replacement 5. Dispo- patient doing well, repeat CXR in AM   LOS: 2 days    [name], [name] 12/06/2012  I have seen and examined [name] [name] and agree with the above assessment  and plan.  Delight Ovens MD Beeper [phone] Office [phone] 12/06/2012 3:59 PM

## 2012-12-06 NOTE — Op Note (Signed)
NAMECRISTEN, [name] [name]             ACCOUNT NO.:  [id number]  MEDICAL RECORD NO.:  [id number]  LOCATION:  3313                         FACILITY:  [facility]  PHYSICIAN:  Sheliah Plane, MD    DATE OF BIRTH:  [birth year]  DATE OF PROCEDURE:  12/04/2012 DATE OF DISCHARGE:                              OPERATIVE REPORT   PREOPERATIVE DIAGNOSIS:  Slowly enlarging right upper lobe lung nodule.  POSTOPERATIVE DIAGNOSIS:  Carcinoma of the right upper lobe.  PROCEDURE PERFORMED:  Bronchoscopy, right video-assisted thoracoscopy, mini thoracotomy, wedge resection, right upper lobe and completion right upper lobectomy with lymph node sampling, placement of On-Q device.  SURGEON:  Sheliah Plane, MD  FIRST ASSISTANT:  Coral Ceo, PA  BRIEF HISTORY:  The patient is a 62 year old female smoker, who was referred by Dr. Shelle Iron.  The patient has had a known right upper lobe lung nodule that was radiographically present for several years but has increased in size on recent chest x-ray.  Subsequent CT scan shows a lesion of the right upper lobe that is not hypermetabolic on PET scan. There is no evidence of other enlarged lymph nodes, although the lesion was PET negative.  Because it is definitely increased in size, in patient who smokes, it was recommended that the lesion be removed.  She agreed and signed informed consent.  DESCRIPTION OF PROCEDURE:  With central line and arterial line in place, the patient underwent general endotracheal anesthesia without incident. A time-out procedure was performed through the endotracheal tube. Fiberoptic bronchoscope was passed to the subsegmental level.  There was no evidence of endobronchial lesions.  The scope was removed.  The double-lumen endotracheal tube was placed.  The patient was turned in lateral decubitus position with the right side up.  Initially a small port incision was made and the video scope introduced into the chest. There was no evidence  of pleural disease.  The lesion itself was not evident on the surface of the lung.  A small incision was made at about the 4th intercostal space through this and with the 2 port sites, the lesion could be palpated.  A vascular clamp was placed under the lesion and a wedge resection of right upper lobe was carried out.  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  There was only a 4 mm margin.  This was not felt to be satisfactory and with the proven diagnosis of malignancy, it was decided to proceed with completion right upper lobectomy.  Starting anteriorly the right upper lobe vein was encircled and stapled with a vascular stapler.  This gave exposure of 2 separate pulmonary artery branches to the upper lobe, each were divided with the stapler.  The right upper lobe bronchus was divided with a black stapler.  Prior to firing, the staple to middle and lower lobes were inflated confirming no obstruction of the bronchus intermedius was present.  Neither the major or the minor fissure were very complete.  Using serial firings of the stapler, fissures were completed and the specimen was then removed.  Frozen section on the bronchial margin was negative for tumor.  The lymph nodes in the 10R, 11R, and 12R regions were  dissected and submitted in separate specimen cups to Pathology.  Using a tunneling device, a On-Q catheter was placed subpleurally along the posterior rib margins and brought out through a separate site.  Two 28 chest tubes were placed through the port sites.  The utility incision was then closed with interrupted 0 Vicryl, running 3-0 Vicryl in subcutaneous tissue, and 4-0 subcuticular stitch in skin edges.  Dermabond dressing was applied.  The lung was reinflated.  There was a very small air leak present.  The patient was then awakened and extubated in the operating room and then transferred to the recovery room for further  postoperative care. Estimated blood loss was approximately 150 mL.  She tolerated the procedure without obvious complication.  Sponge and needle count was reported as correct at completion of procedure.     Sheliah Plane, MD     EG/MEDQ  D:  12/05/2012  T:  12/06/2012  Job:  [id number]  cc:   [name], MD,FCCP

## 2012-12-07 ENCOUNTER — Inpatient Hospital Stay (HOSPITAL_COMMUNITY): Payer: Medicaid Other

## 2012-12-07 LAB — GLUCOSE, CAPILLARY
Glucose-Capillary: 162 mg/dL — ABNORMAL HIGH (ref 70–99)
Glucose-Capillary: 113 mg/dL — ABNORMAL HIGH (ref 70–99)
Glucose-Capillary: 136 mg/dL — ABNORMAL HIGH (ref 70–99)
Glucose-Capillary: 134 mg/dL — ABNORMAL HIGH (ref 70–99)

## 2012-12-07 LAB — BASIC METABOLIC PANEL
BUN: 13 mg/dL (ref 6–23)
CO2: 30 mEq/L (ref 19–32)
Calcium: 9.2 mg/dL (ref 8.4–10.5)
Chloride: 99 mEq/L (ref 96–112)
Creatinine, Ser: 0.75 mg/dL (ref 0.50–1.10)
GFR calc Af Amer: >90 mL/min (ref >90)
GFR calc non Af Amer: 88 mL/min — ABNORMAL LOW (ref >90)
Glucose, Bld: 110 mg/dL — ABNORMAL HIGH (ref 70–99)
Potassium: 3.2 mEq/L — ABNORMAL LOW (ref 3.5–5.1)
Sodium: 136 mEq/L (ref 135–145)

## 2012-12-07 LAB — CBC
HCT: 34.3 % — ABNORMAL LOW (ref 36.0–46.0)
Hemoglobin: 11.4 g/dL — ABNORMAL LOW (ref 12.0–15.0)
MCH: 29.7 pg (ref 26.0–34.0)
MCHC: 33.2 g/dL (ref 30.0–36.0)
MCV: 89.3 fL (ref 78.0–100.0)
Platelets: 203 10*3/uL (ref 150–400)
RBC: 3.84 MIL/uL — ABNORMAL LOW (ref 3.87–5.11)
RDW: 13.7 % (ref 11.5–15.5)
WBC: 8.9 10*3/uL (ref 4.0–10.5)

## 2012-12-07 NOTE — Progress Notes (Signed)
Patient ID: [name] [name], female   DOB: 1951-01-10, 62 y.o.   MRN: [id number]                   [address]            [place] [address]          [phone]     3 Days Post-Op Procedure(s) (LRB): VIDEO BRONCHOSCOPY (N/A) VIDEO ASSISTED THORACOSCOPY (VATS)/WEDGE RESECTION (Right) LOBECTOMY (Right)  LOS: 3 days   Subjective: Feels better today, no BM yesterday  Objective: Vital signs in last 24 hours: Patient Vitals for the past 24 hrs:  BP Temp Temp src Pulse Resp SpO2  12/07/12 0820 - 98.4 F (36.9 C) Oral - - -  12/07/12 0623 120/62 mmHg 98.7 F (37.1 C) Oral 94  23  98 %  12/07/12 0000 109/57 mmHg 97.8 F (36.6 C) Oral 101  20  100 %  12/06/12 2000 - 98.5 F (36.9 C) Oral - - -  12/06/12 1543 111/73 mmHg - - 111  21  100 %  12/06/12 1542 - 99.9 F (37.7 C) Oral - - -  12/06/12 1207 103/61 mmHg - - 106  17  100 %    Filed Weights   12/05/12 0000 12/05/12 0600  Weight: 158 lb (71.668 kg) 153 lb 10.6 oz (69.7 kg)    Hemodynamic parameters for last 24 hours:    Intake/Output from previous day: 01/17 0701 - 01/18 0700 In: 1030 [P.O.:480; I.V.:550] Out: 1635 [Urine:1400; Chest Tube:235] Intake/Output this shift: Total I/O In: 360 [P.O.:360] Out: 10 [Chest Tube:10]  Scheduled Meds:   . irbesartan  300 mg Oral Daily   And  . amLODipine  10 mg Oral Daily   And  . hydrochlorothiazide  25 mg Oral Daily  . aspirin EC  81 mg Oral Daily  . bisacodyl  10 mg Oral Daily  . enoxaparin  30 mg Subcutaneous Q24H  . gabapentin  400 mg Oral TID  . insulin aspart  0-24 Units Subcutaneous Q6H  . loratadine  10 mg Oral Daily  . metFORMIN  500 mg Oral BID WC  . pantoprazole  40 mg Oral Daily  . polyethylene glycol  17 g Oral Daily   Continuous Infusions:   . bupivacaine ON-Q pain pump    . dextrose 5 % and 0.45 % NaCl with KCl 20 mEq/L 50 mL/hr at 12/07/12 0700   PRN Meds:.albuterol, ALPRAZolam, HYDROcodone-acetaminophen, ketorolac, ondansetron  (ZOFRAN) IV, pneumococcal 23 valent vaccine, potassium chloride, promethazine, senna-docusate  General appearance: alert and cooperative Neurologic: intact Heart: regular rate and rhythm, S1, S2 normal, no murmur, click, rub or gallop and normal apical impulse Lungs: clear to auscultation bilaterally and normal percussion bilaterally Abdomen: soft, non-tender; bowel sounds normal; no masses,  no organomegaly Extremities: extremities normal, atraumatic, no cyanosis or edema and Homans sign is negative, no sign of DVT Wound: no air leak  Lab Results: CBC: Basename 12/07/12 0520 12/06/12 0405  WBC 8.9 9.0  HGB 11.4* 11.9*  HCT 34.3* 36.2  PLT 203 220   BMET:  Basename 12/07/12 0520 12/06/12 0405  NA 136 139  K 3.2* 3.5  CL 99 101  CO2 30 29  GLUCOSE 110* 103*  BUN 13 10  CREATININE 0.75 0.55  CALCIUM 9.2 9.2    PT/INR: No results found for this basename: LABPROT,INR in the last 72 hours   Radiology D[facility] 1 View  12/07/2012  *RADIOLOGY REPORT*  Clinical  Data: Evaluate pneumothorax  PORTABLE CHEST - 1 VIEW  Comparison: 12/06/2012; 12/05/2012; 12/04/2012  Findings:  Grossly unchanged enlarged cardiac silhouette and mediastinal contours.  Stable position of support apparatus.  Interval reduction in now tiny right apical pneumothorax.  The amount of right lateral chest wall subcutaneous emphysema is grossly unchanged.  Stable postsurgical change of the right hilum.  There is expected mild elevation/eventration of the right hemidiaphragm. Grossly unchanged left mid lung linear heterogeneous opacities favored to represent subsegmental atelectasis.  No new focal airspace opacity.  No definite pleural effusion.  Unchanged bones.  IMPRESSION: Stable position of support apparatus with interval reduction in now tiny right apical pneumothorax.   Original Report Authenticated By: Tacey Ruiz, MD    Dg Chest Port 1 View  12/06/2012  *RADIOLOGY REPORT*  Clinical Data: Air leak, question  pneumothorax  PORTABLE CHEST - 1 VIEW  Comparison: Portable exam 0612 hours compared to 12/05/2012  Findings: Pair of right thoracostomy tubes unchanged. Right jugular central venous catheter tip projecting over SVC just above cavoatrial junction. Borderline enlargement of cardiac silhouette. Mediastinal contours and pulmonary vascularity normal. Atherosclerotic calcification aortic arch. Questionable small right apex pneumothorax though there are a few pulmonary markings that extend beyond this line. Minimal right chest wall emphysema. Subsegmental atelectasis lower left lung.  IMPRESSION: Subsegmental atelectasis left lung base. Questionable small right apex pneumothorax in patient with two right thoracostomy tubes.   Original Report Authenticated By: Ulyses Southward, M.D.      Assessment/Plan: S/P Procedure(s) (LRB): VIDEO BRONCHOSCOPY (N/A) VIDEO ASSISTED THORACOSCOPY (VATS)/WEDGE RESECTION (Right) LOBECTOMY (Right) Hypokalemia, replace K D/c posterior ct today Final path still pending  Delight Ovens MD 12/07/2012 11:37 AM

## 2012-12-07 NOTE — Progress Notes (Signed)
Pt posterior CT d/c per MD order, pt tol well, no complaints, pt educated to call nursing if any concerns, questions, or changes from current state

## 2012-12-07 NOTE — Progress Notes (Addendum)
On-call Chaplain responded to [id number] after patient requested through her nurse to see a Chaplain. Patient was alert, responsive but appeared to be in some pain. Patient expressed and acknowledge improvement in her health. Patient continue to receive and benefit from family support. Chaplain provided ministry of presence, empathic listening and prayed with patient. Chaplain will follow-up as needed. Patient thanked Chaplain for the visit and spiritual presence.

## 2012-12-08 ENCOUNTER — Inpatient Hospital Stay (HOSPITAL_COMMUNITY): Payer: Medicaid Other

## 2012-12-08 DIAGNOSIS — F419 Anxiety disorder, unspecified: Secondary | ICD-10-CM | POA: Diagnosis present

## 2012-12-08 DIAGNOSIS — F329 Major depressive disorder, single episode, unspecified: Secondary | ICD-10-CM

## 2012-12-08 DIAGNOSIS — F319 Bipolar disorder, unspecified: Secondary | ICD-10-CM | POA: Diagnosis present

## 2012-12-08 DIAGNOSIS — F411 Generalized anxiety disorder: Secondary | ICD-10-CM

## 2012-12-08 LAB — BASIC METABOLIC PANEL
BUN: 12 mg/dL (ref 6–23)
CO2: 29 mEq/L (ref 19–32)
Calcium: 9.2 mg/dL (ref 8.4–10.5)
Chloride: 98 mEq/L (ref 96–112)
Creatinine, Ser: 0.62 mg/dL (ref 0.50–1.10)
GFR calc Af Amer: >90 mL/min (ref >90)
GFR calc non Af Amer: >90 mL/min (ref >90)
Glucose, Bld: 134 mg/dL — ABNORMAL HIGH (ref 70–99)
Potassium: 3.3 mEq/L — ABNORMAL LOW (ref 3.5–5.1)
Sodium: 136 mEq/L (ref 135–145)

## 2012-12-08 LAB — CBC
HCT: 32.7 % — ABNORMAL LOW (ref 36.0–46.0)
Hemoglobin: 10.7 g/dL — ABNORMAL LOW (ref 12.0–15.0)
MCH: 29 pg (ref 26.0–34.0)
MCHC: 32.7 g/dL (ref 30.0–36.0)
MCV: 88.6 fL (ref 78.0–100.0)
Platelets: 211 10*3/uL (ref 150–400)
RBC: 3.69 MIL/uL — ABNORMAL LOW (ref 3.87–5.11)
RDW: 13.2 % (ref 11.5–15.5)
WBC: 7.9 10*3/uL (ref 4.0–10.5)

## 2012-12-08 LAB — URINALYSIS, ROUTINE W REFLEX MICROSCOPIC
Bilirubin Urine: NEGATIVE
Nitrite: NEGATIVE
Protein, ur: NEGATIVE mg/dL
Specific Gravity, Urine: 1.006 (ref 1.005–1.030)
Urobilinogen, UA: 1 mg/dL (ref 0.0–1.0)

## 2012-12-08 LAB — GLUCOSE, CAPILLARY
Glucose-Capillary: 101 mg/dL — ABNORMAL HIGH (ref 70–99)
Glucose-Capillary: 104 mg/dL — ABNORMAL HIGH (ref 70–99)
Glucose-Capillary: 109 mg/dL — ABNORMAL HIGH (ref 70–99)
Glucose-Capillary: 121 mg/dL — ABNORMAL HIGH (ref 70–99)

## 2012-12-08 LAB — URINE MICROSCOPIC-ADD ON

## 2012-12-08 MED ORDER — POTASSIUM CHLORIDE CRYS ER 20 MEQ PO TBCR
40.0000 meq | EXTENDED_RELEASE_TABLET | Freq: Once | ORAL | Status: AC
  Administered 2012-12-08: 40 meq via ORAL
  Filled 2012-12-08: qty 2

## 2012-12-08 MED ORDER — KCL IN DEXTROSE-NACL 20-5-0.45 MEQ/L-%-% IV SOLN
INTRAVENOUS | Status: DC
  Administered 2012-12-08: 20 mL via INTRAVENOUS
  Filled 2012-12-08 (×3): qty 1000

## 2012-12-08 NOTE — Progress Notes (Signed)
Pt observed to have increased cont changes in mood, switching between crying and upset to happy, pt stated see's mental health in [place] for Bipolar DO, and takes xanax for anxiety feelings at home, RN notified MD order concern of pt's over all behavior, Behavorial Health RN also contacted for concern, RN will cont to manage pt with home regimen and nursing will cont to monitor

## 2012-12-08 NOTE — Progress Notes (Signed)
Chaplain visited patient who appeared clam but stated she had been getting a little stressed. Pt says she wishes she could see her psychiatrist in [place].  Chaplain provided non-anxious presence to enable to talk about her emotions. Also provided spiritual and emotional support and prayed with patient. Recommend follow up by unit chaplain.

## 2012-12-08 NOTE — Progress Notes (Addendum)
[address]            [place] [address]          [phone]     4 Days Post-Op  Procedure(s) (LRB): VIDEO BRONCHOSCOPY (N/A) VIDEO ASSISTED THORACOSCOPY (VATS)/WEDGE RESECTION (Right) LOBECTOMY (Right) Subjective: Some dysuria- thinks she may have a UTI  Objective  Telemetry sinus, some pac's   Temp:  [97.8 F (36.6 C)-98.8 F (37.1 C)] 98.2 F (36.8 C) (01/19 1032) Pulse Rate:  [96-100] 100  (01/19 0445) Resp:  [15-22] 22  (01/19 1032) BP: (115-139)/(61-76) 130/61 mmHg (01/19 1032) SpO2:  [97 %-100 %] 98 % (01/19 1032)   Intake/Output Summary (Last 24 hours) at 12/08/12 1113 Last data filed at 12/08/12 0900  Gross per 24 hour  Intake 1849.17 ml  Output   1705 ml  Net 144.17 ml       General appearance: alert, cooperative and no distress Heart: regular rate and rhythm Lungs: dim in bases Abdomen: soft, nontender Extremities: no edema Wound: incisions healing well  Lab Results:  Basename 12/08/12 0441 12/07/12 0520  NA 136 136  K 3.3* 3.2*  CL 98 99  CO2 29 30  GLUCOSE 134* 110*  BUN 12 13  CREATININE 0.62 0.75  CALCIUM 9.2 9.2  MG -- --  PHOS -- --    Basename 12/06/12 0405  AST 39*  ALT 19  ALKPHOS 61  BILITOT 1.0  PROT 6.6  ALBUMIN 3.4*   No results found for this basename: LIPASE:2,AMYLASE:2 in the last 72 hours  Basename 12/08/12 0441 12/07/12 0520  WBC 7.9 8.9  NEUTROABS -- --  HGB 10.7* 11.4*  HCT 32.7* 34.3*  MCV 88.6 89.3  PLT 211 203   No results found for this basename: CKTOTAL:4,CKMB:4,TROPONINI:4 in the last 72 hours No components found with this basename: POCBNP:3 No results found for this basename: DDIMER in the last 72 hours No results found for this basename: HGBA1C in the last 72 hours No results found for this basename: CHOL,HDL,LDLCALC,TRIG,CHOLHDL in the last 72 hours No results found for this basename: TSH,T4TOTAL,FREET3,T3FREE,THYROIDAB in the last 72 hours No results found  for this basename: VITAMINB12,FOLATE,FERRITIN,TIBC,IRON,RETICCTPCT in the last 72 hours  Medications: Scheduled    . irbesartan  300 mg Oral Daily   And  . amLODipine  10 mg Oral Daily   And  . hydrochlorothiazide  25 mg Oral Daily  . aspirin EC  81 mg Oral Daily  . bisacodyl  10 mg Oral Daily  . enoxaparin  30 mg Subcutaneous Q24H  . gabapentin  400 mg Oral TID  . insulin aspart  0-24 Units Subcutaneous Q6H  . loratadine  10 mg Oral Daily  . metFORMIN  500 mg Oral BID WC  . pantoprazole  40 mg Oral Daily  . polyethylene glycol  17 g Oral Daily     Radiology/Studies:  Dg Chest 2 View  12/08/2012  *RADIOLOGY REPORT*  Clinical Data: Lung resection.  CHEST - 2 VIEW  Comparison: 1 day prior  Findings: Right IJ central line unchanged.  Removal of 1 of 2 right- sided chest tubes.  The right apical pneumothorax is minimally enlarged.  The visceral pleural line is 1.0 cm from the chest wall today versus 6 mm on the prior. Approximately 5%.  Persistent subcutaneous emphysema about the right chest wall.  Mild cardiomegaly. No pleural fluid.  Right perihilar  soft tissue fullness could be postoperative.  Minimal volume loss at the left lung base.  IMPRESSION: Removal of 1 of 2 right-sided chest tubes, with minimal increase in small right apical pneumothorax.   Original Report Authenticated By: Jeronimo Greaves, M.D.    Dg Chest Port 1 View  12/07/2012  *RADIOLOGY REPORT*  Clinical Data: Evaluate pneumothorax  PORTABLE CHEST - 1 VIEW  Comparison: 12/06/2012; 12/05/2012; 12/04/2012  Findings:  Grossly unchanged enlarged cardiac silhouette and mediastinal contours.  Stable position of support apparatus.  Interval reduction in now tiny right apical pneumothorax.  The amount of right lateral chest wall subcutaneous emphysema is grossly unchanged.  Stable postsurgical change of the right hilum.  There is expected mild elevation/eventration of the right hemidiaphragm. Grossly unchanged left mid lung linear  heterogeneous opacities favored to represent subsegmental atelectasis.  No new focal airspace opacity.  No definite pleural effusion.  Unchanged bones.  IMPRESSION: Stable position of support apparatus with interval reduction in now tiny right apical pneumothorax.   Original Report Authenticated By: Tacey Ruiz, MD    Chest tube + air leak    INR: Will add last result for INR, ABG once components are confirmed Will add last 4 CBG results once components are confirmed  Assessment/Plan: S/P Procedure(s) (LRB): VIDEO BRONCHOSCOPY (N/A) VIDEO ASSISTED THORACOSCOPY (VATS)/WEDGE RESECTION (Right) LOBECTOMY (Right)  1. Check ua 2 cont CT with air leak, may need to go back on suction 3 replace K+ 4 pulm toilet /rehab 5 cbg's good control   LOS: 4 days    GOLD,WAYNE E 1/19/201411:13 AM    Very small air leak with cough, leave on water seal,  Patient wants to see mental health about bipolar disorder I have seen and examined Maureen Chatters and agree with the above assessment  and plan.  Delight Ovens MD Beeper [phone] Office [phone] 12/08/2012 12:27 PM
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
## 2012-12-09 ENCOUNTER — Inpatient Hospital Stay (HOSPITAL_COMMUNITY): Payer: Medicaid Other

## 2012-12-09 DIAGNOSIS — F39 Unspecified mood [affective] disorder: Secondary | ICD-10-CM

## 2012-12-09 LAB — CBC
HCT: 31.8 % — ABNORMAL LOW (ref 36.0–46.0)
Hemoglobin: 10.9 g/dL — ABNORMAL LOW (ref 12.0–15.0)
MCH: 29.9 pg (ref 26.0–34.0)
MCHC: 34.3 g/dL (ref 30.0–36.0)
MCV: 87.4 fL (ref 78.0–100.0)
Platelets: 257 10*3/uL (ref 150–400)
RBC: 3.64 MIL/uL — ABNORMAL LOW (ref 3.87–5.11)
RDW: 13 % (ref 11.5–15.5)
WBC: 6.9 10*3/uL (ref 4.0–10.5)

## 2012-12-09 LAB — GLUCOSE, CAPILLARY
Glucose-Capillary: 113 mg/dL — ABNORMAL HIGH (ref 70–99)
Glucose-Capillary: 127 mg/dL — ABNORMAL HIGH (ref 70–99)
Glucose-Capillary: 156 mg/dL — ABNORMAL HIGH (ref 70–99)
Glucose-Capillary: 96 mg/dL (ref 70–99)

## 2012-12-09 LAB — BASIC METABOLIC PANEL
Calcium: 9.2 mg/dL (ref 8.4–10.5)
Chloride: 99 mEq/L (ref 96–112)
Creatinine, Ser: 0.64 mg/dL (ref 0.50–1.10)
GFR calc Af Amer: >90 mL/min (ref >90)
GFR calc non Af Amer: >90 mL/min (ref >90)

## 2012-12-09 MED ORDER — AMLODIPINE BESYLATE 5 MG PO TABS
5.0000 mg | ORAL_TABLET | Freq: Every day | ORAL | Status: DC
  Administered 2012-12-09 – 2012-12-12 (×4): 5 mg via ORAL
  Filled 2012-12-09 (×4): qty 1

## 2012-12-09 MED ORDER — IRBESARTAN 300 MG PO TABS
300.0000 mg | ORAL_TABLET | Freq: Every day | ORAL | Status: DC
  Administered 2012-12-09 – 2012-12-12 (×4): 300 mg via ORAL
  Filled 2012-12-09 (×4): qty 1

## 2012-12-09 MED ORDER — POTASSIUM CHLORIDE CRYS ER 20 MEQ PO TBCR
40.0000 meq | EXTENDED_RELEASE_TABLET | Freq: Once | ORAL | Status: AC
  Administered 2012-12-09: 40 meq via ORAL
  Filled 2012-12-09: qty 2

## 2012-12-09 MED ORDER — HYDROCHLOROTHIAZIDE 25 MG PO TABS
25.0000 mg | ORAL_TABLET | Freq: Every day | ORAL | Status: DC
  Administered 2012-12-09 – 2012-12-12 (×4): 25 mg via ORAL
  Filled 2012-12-09 (×4): qty 1

## 2012-12-09 MED ORDER — CIPROFLOXACIN HCL 500 MG PO TABS
500.0000 mg | ORAL_TABLET | Freq: Two times a day (BID) | ORAL | Status: DC
  Administered 2012-12-09 – 2012-12-12 (×7): 500 mg via ORAL
  Filled 2012-12-09 (×10): qty 1
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
## 2012-12-09 NOTE — Progress Notes (Signed)
Pt was very glad for Chaplain visit and very hopeful of her recovery and returning home. She was very positive about her condition and outcome. She asked for prayer. Pt was very thankful for prayer and visit and wants a follow-up visit tomorrow. Marjory Lies Chaplain

## 2012-12-09 NOTE — Progress Notes (Signed)
Utilization review completed.  

## 2012-12-09 NOTE — Progress Notes (Signed)
Clinical Social Work Department CLINICAL SOCIAL WORK PSYCHIATRY SERVICE LINE ASSESSMENT 12/09/2012  Patient:  [name] [name]  Account:  [id number]  Admit Date:  12/04/2012  Clinical Social Worker:  Unk Lightning, LCSW  Date/Time:  12/09/2012 02:45 PM Referred by:  Physician  Date referred:  12/09/2012 Reason for Referral  Behavioral Health Issues   Presenting Symptoms/Problems (In the person's/family's own words):   "I told that RN I needed to see a psychiatrist because she made me mad."   Abuse/Neglect/Trauma History (check all that apply)  Denies history   Abuse/Neglect/Trauma Comments:   Psychiatric History (check all that apply)  Outpatient treatment   Psychiatric medications:  Xanax and Neurontin   Current Mental Health Hospitalizations/Previous Mental Health History:   Patient reports she was diagnosed with bipolar disorder several years ago.   Current provider:   Patient reports she had psychiatrist in [place]. No follow up in [place]   Place and Date:   N/A   Current Medications:   albuterol, ALPRAZolam, HYDROcodone-acetaminophen, ketorolac, ondansetron (ZOFRAN) IV, pneumococcal 23 valent vaccine, potassium chloride, promethazine, senna-docusate                        . amLODipine  5 mg Oral Daily   And     . irbesartan  300 mg Oral Daily   And     . hydrochlorothiazide  25 mg Oral Daily  . aspirin EC  81 mg Oral Daily  . bisacodyl  10 mg Oral Daily  . ciprofloxacin  500 mg Oral BID  . enoxaparin  30 mg Subcutaneous Q24H  . gabapentin  400 mg Oral TID  . insulin aspart  0-24 Units Subcutaneous Q6H  . loratadine  10 mg Oral Daily  . metFORMIN  500 mg Oral BID WC  . pantoprazole  40 mg Oral Daily  . polyethylene glycol  17 g Oral Daily   Previous Impatient Admission/Date/Reason:   None reported   Emotional Health / Current Symptoms    Suicide/Self Harm  None reported   Suicide attempt in the past:   Denies SI or HI   Other harmful behavior:     Psychotic/Dissociative Symptoms  None reported   Other Psychotic/Dissociative Symptoms:    Attention/Behavioral Symptoms  Restless   Other Attention / Behavioral Symptoms:    Cognitive Impairment  Within Normal Limits   Other Cognitive Impairment:    Mood and Adjustment  Anxious    Stress, Anxiety, Trauma, Any Recent Loss/Stressor  Other - See comment   Anxiety (frequency):   Phobia (specify):   Compulsive behavior (specify):   Obsessive behavior (specify):   Other:   Recent move to . Son passed away in 04-02-2008.   Substance Abuse/Use  None   SBIRT completed (please refer for detailed history):  N  Self-reported substance use:   Patient denies all substance use   Urinary Drug Screen Completed:  N Alcohol level:    Environmental/Housing/Living Arrangement  Stable housing   Who is in the home:   Dtr, 2 grandchildren   Emergency contact:  Andoria-dtr   Financial  Medicaid   Patient's Strengths and Goals (patient's own words):   Patient reports that she is doing well and can manage on her own.   Clinical Social Worker's Interpretive Summary:   CSW received referral to complete psychosocial assessment. CSW reviewed chart and met with patient at bedside. No visitors present.    CSW introduced myself and explained role. [name] divorced and had  6 children. One child passed away in 04/10/2008. Patient currently lives with dtr and her two children. Patient's family lives in [place] but she moved to [place] to be with dtr. Patient reports good relationship btw her and dtr.    Patient reports that she asked to see psychiatrist yesterday. Patient reports that tube was bleeding and she did not feel that RN properly cared for tube. Patient reports that she told RN that she was crazy and needed to see psychiatrist. Patient lays back in bed and calms down. She reports that she does not need a psychiatrist but was frustrated yesterday. Patient reports that she was worried about her  medical wellbeing but feels that she is doing better now. Patient reports she was diagnosed with bipolar several years ago. Patient denies any SI or HI or psychotic features. Patient reports mood changes and reports she is easily irritated. Patient feels medication allows for good balance. Patient's PCP prescribes medication at this time.    CSW and patient discussed outpatient follow up. Patient reports that PCP has been suggesting that patient see a psychiatrist. CSW spoke with patient regarding referral to [facility]. Patient reports that she has called to schedule an appointment in the past but reports that she did not always have transportation. CSW provided patient with information for [facility] transportation) and encouraged patient to use services. [facility] informed placed on AVS for patient to call and reschedule an appointment once dc from the hospital.    Patient was engaged throughout the assessment. Patient appropriate throughout assessment but had periods of excitement especially when discussing RN from yesterday. [name] able to reflect on mood and acknowledges her irritability. Patient aware that mood has been affected by hospital stay and agreeable to outpatient follow up. Patient has good support systems and resources to get to appointments.    CSW will continue to follow to assist as needed and to follow psych MD recommendations.   Disposition:  Outpatient referral made/needed

## 2012-12-09 NOTE — Progress Notes (Addendum)
                   [address]            [place] [address]          [phone]    5 Days Post-Op Procedure(s) (LRB): VIDEO BRONCHOSCOPY (N/A) VIDEO ASSISTED THORACOSCOPY (VATS)/WEDGE RESECTION (Right) LOBECTOMY (Right)  Subjective: Patient eating breakfast without complaints  Objective: Vital signs in last 24 hours: Temp:  [97.6 F (36.4 C)-98.3 F (36.8 C)] 98.3 F (36.8 C) (01/20 0729) Pulse Rate:  [93-105] 97  (01/20 0349) Cardiac Rhythm:  [-] Normal sinus rhythm (01/20 0729) Resp:  [15-22] 18  (01/20 0729) BP: (99-139)/(56-74) 99/74 mmHg (01/20 0729) SpO2:  [92 %-100 %] 92 % (01/20 0729)    Intake/Output from previous day: 01/19 0701 - 01/20 0700 In: 223.3 [I.V.:223.3] Out: 385 [Urine:375; Chest Tube:10]   Physical Exam:  Cardiovascular: RRR Pulmonary: Clear to auscultation bilaterally; no rales, wheezes, or rhonchi. Abdomen: Soft, non tender, bowel sounds present. Extremities: No  lower extremity edema. Wounds: Clean and dry.  No erythema or signs of infection. Chest Tube: to water seal, air leak present  Lab Results: CBC: Basename 12/09/12 0452 12/08/12 0441  WBC 6.9 7.9  HGB 10.9* 10.7*  HCT 31.8* 32.7*  PLT 257 211   BMET:  Basename 12/09/12 0452 12/08/12 0441  NA 135 136  K 3.5 3.3*  CL 99 98  CO2 30 29  GLUCOSE 100* 134*  BUN 14 12  CREATININE 0.64 0.62  CALCIUM 9.2 9.2    PT/INR: No results found for this basename: LABPROT,INR in the last 72 hours ABG:  INR: Will add last result for INR, ABG once components are confirmed Will add last 4 CBG results once components are confirmed  Assessment/Plan:  1. CV - SR. On Norvasc 10 daily, HCTZ 25 daily, and Irbesartan 300 daily. Will decrease Norvasc to 5 daily as SBP somewhat labile.  2.  Pulmonary - Chest tube is to water seal. 80 cc of output last 24 hours. Chest tube with 2+ air leak. CXR this am shows small, stable right apical pneumothorax and left lung is clear. May  need to place chest tube to mini express. Dr. Tyrone Sage to evaluate 3.Psych-history of bipolar disorder. Appreciate Dr. Runell Gess assistance 4.DM-CBGs 101/156/127. Continue Metformin 500 bid 5.UA microscopic showed many bacteria. Culture is still pending. Will start Cipro 6.Supplement potassium  [name],[name] MPA-C 12/09/2012,8:35 AM   Very small to no airleak now, good fluctuations in tube Path still pending I have seen and examined [name] [name] and agree with the above assessment  and plan.  Delight Ovens MD Beeper [phone] Office [phone] 12/09/2012 12:02 PM

## 2012-12-10 ENCOUNTER — Inpatient Hospital Stay (HOSPITAL_COMMUNITY): Payer: Medicaid Other

## 2012-12-10 DIAGNOSIS — Z9889 Other specified postprocedural states: Secondary | ICD-10-CM

## 2012-12-10 DIAGNOSIS — J939 Pneumothorax, unspecified: Secondary | ICD-10-CM

## 2012-12-10 LAB — GLUCOSE, CAPILLARY
Glucose-Capillary: 117 mg/dL — ABNORMAL HIGH (ref 70–99)
Glucose-Capillary: 126 mg/dL — ABNORMAL HIGH (ref 70–99)
Glucose-Capillary: 96 mg/dL (ref 70–99)

## 2012-12-10 LAB — URINE CULTURE: Colony Count: >=100000

## 2012-12-10 MED ORDER — CIPROFLOXACIN HCL 500 MG PO TABS: 500.0000 mg | ORAL_TABLET | Freq: Two times a day (BID) | ORAL | Status: DC

## 2012-12-10 MED ORDER — SODIUM CHLORIDE 0.9 % IJ SOLN
INTRAMUSCULAR | Status: AC
  Administered 2012-12-10: 10 mL
  Filled 2012-12-10: qty 20

## 2012-12-10 NOTE — Progress Notes (Addendum)
                   [address]            [place] [address]          [phone]    6 Days Post-Op Procedure(s) (LRB): VIDEO BRONCHOSCOPY (N/A) VIDEO ASSISTED THORACOSCOPY (VATS)/WEDGE RESECTION (Right) LOBECTOMY (Right)  Subjective: Patient just finished eating breakfast; she is without complaints  Objective: Vital signs in last 24 hours: Temp:  [97.6 F (36.4 C)-99.2 F (37.3 C)] 98.8 F (37.1 C) (01/21 0405) Cardiac Rhythm:  [-] Normal sinus rhythm (01/21 0405) Resp:  [13-18] 17  (01/21 0736) BP: (113-140)/(58-73) 140/73 mmHg (01/21 0736) SpO2:  [93 %-96 %] 96 % (01/21 0736)    Intake/Output from previous day: 01/20 0701 - 01/21 0700 In: 1440 [P.O.:1200; I.V.:240] Out: 310 [Urine:200; Chest Tube:110]   Physical Exam:  Cardiovascular: RRR Pulmonary: Clear to auscultation bilaterally; no rales, wheezes, or rhonchi. Abdomen: Soft, non tender, bowel sounds present. Extremities: No  lower extremity edema. Wounds: Clean and dry.  No erythema or signs of infection. Chest Tube: to water seal, tidling in chest tube.   Lab Results: CBC:  Basename 12/09/12 0452 12/08/12 0441  WBC 6.9 7.9  HGB 10.9* 10.7*  HCT 31.8* 32.7*  PLT 257 211   BMET:   Basename 12/09/12 0452 12/08/12 0441  NA 135 136  K 3.5 3.3*  CL 99 98  CO2 30 29  GLUCOSE 100* 134*  BUN 14 12  CREATININE 0.64 0.62  CALCIUM 9.2 9.2    PT/INR: No results found for this basename: LABPROT,INR in the last 72 hours ABG:  INR: Will add last result for INR, ABG once components are confirmed Will add last 4 CBG results once components are confirmed  Assessment/Plan:  1. CV - SR. On Norvasc 10 daily, HCTZ 25 daily, and Irbesartan 300 daily. Will decrease Norvasc to 5 daily as SBP somewhat labile.  2.  Pulmonary - Chest tube is to water seal. 110 cc of output last 24 hours. Chest tube with tidling in tube.CXR this am shows resolution of previous right apical pneumothorax, atelectasis  on left. Will remove chest tube. Pathology results noted-not discussed with the patient 3.Psych-history of bipolar disorder. Recommended Depakote at hs but not ordered?.Appreciate psychiatry's assistance. 4.DM-CBGs 113/126/96. Continue Metformin 500 bid 5.UA microscopic showed many bacteria. Preliminary culture showed E coli. On cipro 6.Possible discharge in am  [name],[name] MPA-C 12/10/2012,8:08 AM   D/c ct today  Delight Ovens MD  Beeper [phone] Office [phone] 12/10/2012 9:12 AM

## 2012-12-10 NOTE — Discharge Summary (Addendum)
Physician Discharge Summary  Patient ID: [name] [name] MRN: [id number] DOB/AGE: [id number] 64 y.o.  Admit date: 12/04/2012 Discharge date: 12/10/2012  Admission Diagnoses:  Patient Active Problem List  Diagnosis  . Lung mass  . Bipolar disorder  . Anxiety disorder   Discharge Diagnoses:   Patient Active Problem List  Diagnosis  . Lung mass  . Bipolar disorder  . Anxiety disorder  . S/P thoracotomy  . Pneumothorax on right   Discharged Condition: good  History of Present Illness:   [name] [name] is a 62 yo African American Female who was referred to [facility] by Dr. Shelle Iron.  The patient was found to have a slowly enlarging Right Upper Lobe nodule.  PET CT scan showed low metabolic activity.  However, patient has a long standing smoking history resulting in a concern for possible adenocarcinoma.  The patient was evaluated by Dr. Tyrone Sage on 11/28/2012 and after review of her imaging studies, he felt she would benefit from surgical resection.  The risks and benefits of the procedure were explained to the patient and surgery was scheduled for 12/04/2012.  Hospital Course:   The patient presented to [facility] on 12/04/2012.  She was taken to the operating room and underwent Bronchoscopy, R VATS with mini Thoracotomy, Right Upper Lobectomy and Lymph Node Sampling.  The patient tolerated the procedure well, was extubated and taken to the ICU in stable condition.  She was weaned off her PCA in the ICU.  Her chest tubes were left on suction due to the presence of an air leak.  Her chest tubes were placed to water seal the morning after surgery.  She developed Hypokalemia which was replaced with IV supplementation.  POD #3 the patient's posterior chest tube was removed.  She was medically stable and transferred to the step down unit.  Her remaining chest tube continued to have a small air leak.  Chest xray remained stable with no evidence of pneumothorax.  Her chest was therefore, removed with  follow up chest xray with development of small right sided pneumothorax.  Patient did suffer a manic episode during hospitalization for which psychiatric evaluation was obtained.  Patient also complained of dysuria.  Urine culture was positive for E. Coli.  She is currently on Cipro and will adjust based on sensitivities if needed.  Repeat CXR obtained continues to show a pneumothorax. The patient is medically stable at this time.  She has developed some diarrhea this morning.  C. Diff toxin has been obtained and if positive she will be started on Flagyl.  She will be discharged home today   Her pathology report is posted below.  She will need to follow up with Dr. Tyrone Sage next week on 12/19/2012 at 9:00 with a chest xray prior to her appointment. She will also need to follow up with Dr. Neil Crouch in 2 weeks.          Consults: psychiatry  Significant Diagnostic Studies: Pathology  1. Lung, wedge biopsy/resection, Right lower lobe - WELL DIFFERENTIATED ADENOCARCINOMA WITH ABUNDANT MUCIN, SPANNING 2 CM IN GREATEST DIMENSION. - PLEURA IS UNINVOLVED. - LYMPH/VASCULAR INVASION IS NOT IDENTIFIED. - MARGINS ARE NEGATIVE. - SEE COMMENT AND ONCOLOGY TEMPLATE. 2. Lung, resection (segmental or lobe), Right upper lobe - BENIGN LUNG PARENCHYMA. - EMPHYSEMATOUS CHANGES. - NO TUMOR SEE. 3. Lymph node, biopsy, 8 - BENIGN VESSEL AND FIBROFATTY SOFT TISSUE. - NO TUMOR SEEN. 4. Lymph node, biopsy, 10 R - ONE BENIGN LYMPH NODE WITH NO TUMOR SEEN (0/1). 5. Lymph  node, biopsy, 11 R - ONE BENIGN LYMPH NODE WITH NO TUMOR SEEN (0/1).  Treatments: surgery:   Bronchoscopy, right video-assisted thoracoscopy,  mini thoracotomy, wedge resection, right upper lobe and completion right  upper lobectomy with lymph node sampling, placement of On-Q device.  Disposition: 01-Home or Self Care     Medication List     As of 12/10/2012  9:45 AM    TAKE these medications         albuterol 108 (90 BASE) MCG/ACT inhaler    Commonly known as: PROVENTIL HFA;VENTOLIN HFA   Inhale 2 puffs into the lungs every 6 (six) hours as needed. For shortness of breath      ALPRAZolam 1 MG tablet   Commonly known as: XANAX   Take 0.5 mg by mouth at bedtime as needed. For sleep and anxiety      aspirin 81 MG tablet   Take 81 mg by mouth daily.      cholecalciferol 1000 UNITS tablet   Commonly known as: VITAMIN D   Take 1,000 Units by mouth once a week. On Mondays      ciprofloxacin 500 MG tablet   Commonly known as: CIPRO   Take 1 tablet (500 mg total) by mouth 2 (two) times daily. For 3 Days      DEXILANT 60 MG capsule   Generic drug: dexlansoprazole   Take 60 mg by mouth daily.      gabapentin 400 MG capsule   Commonly known as: NEURONTIN   Take 400 mg by mouth 3 (three) times daily.      HYDROcodone-acetaminophen 10-500 MG per tablet   Commonly known as: LORTAB   Take 1 tablet by mouth every 6 (six) hours as needed. Pain      loratadine 10 MG tablet   Commonly known as: CLARITIN   Take 10 mg by mouth daily.      metFORMIN 500 MG tablet   Commonly known as: GLUCOPHAGE   Take 500 mg by mouth 2 (two) times daily with a meal.      TRIBENZOR 40-10-25 MG Tabs   Generic drug: Olmesartan-Amlodipine-HCTZ   Take 1 tablet by mouth daily.        Follow-up Information    Call [facility]). (To complete assessment for Medicaid transportation)    Contact information:   [phone]      Call [facility]. (To schedule an appointment to see psychiatrist)    Contact information:   [address] [place], [place] [phone]      Follow up with Delight Ovens, MD.   Contact information:   [address] [place] [address] [phone]       Follow up with [facility].   Contact information:   [place]          Signed: BARRETT, ERIN 12/10/2012, 9:45 AM

## 2012-12-10 NOTE — Progress Notes (Signed)
Pt talked about family situation today wither her son. She also shared how she is better today and was happy tubes had been removed. She asked me to pray for her and thanked me for visit. Will follow-up with pt. Marjory Lies Chaplain

## 2012-12-10 NOTE — Progress Notes (Signed)
Called [name] [name] re: phone call from radiology say pneumo re-established after chest tube removal. Will look at xray results and follow up will continue to monitor. [name]

## 2012-12-11 ENCOUNTER — Inpatient Hospital Stay (HOSPITAL_COMMUNITY): Payer: Medicaid Other

## 2012-12-11 LAB — GLUCOSE, CAPILLARY
Glucose-Capillary: 105 mg/dL — ABNORMAL HIGH (ref 70–99)
Glucose-Capillary: 113 mg/dL — ABNORMAL HIGH (ref 70–99)
Glucose-Capillary: 112 mg/dL — ABNORMAL HIGH (ref 70–99)
Glucose-Capillary: 125 mg/dL — ABNORMAL HIGH (ref 70–99)
Glucose-Capillary: 107 mg/dL — ABNORMAL HIGH (ref 70–99)

## 2012-12-11 NOTE — Progress Notes (Signed)
                   [address]            [place] [address]          [phone]    7 Days Post-Op Procedure(s) (LRB): VIDEO BRONCHOSCOPY (N/A) VIDEO ASSISTED THORACOSCOPY (VATS)/WEDGE RESECTION (Right) LOBECTOMY (Right)  Subjective: Patient's only complaint is sciatica on left  Objective: Vital signs in last 24 hours: Temp:  [98.1 F (36.7 C)-98.7 F (37.1 C)] 98.5 F (36.9 C) (01/22 0734) Pulse Rate:  [96] 96  (01/22 0400) Cardiac Rhythm:  [-] Sinus tachycardia (01/22 0734) Resp:  [12-23] 12  (01/22 0734) BP: (105-133)/(66-86) 129/72 mmHg (01/22 0734) SpO2:  [93 %-96 %] 96 % (01/22 0734)    Intake/Output from previous day: 01/21 0701 - 01/22 0700 In: 1320 [P.O.:1320] Out: 100 [Urine:100]   Physical Exam:  Cardiovascular: RRR Pulmonary: Clear to auscultation on left; slightly diminshed right apex ; no rales, wheezes, or rhonchi. Abdomen: Soft, non tender, bowel sounds present. Extremities: No  lower extremity edema. Wounds: Clean and dry.  No erythema or signs of infection.   Lab Results: CBC:  Basename 12/09/12 0452  WBC 6.9  HGB 10.9*  HCT 31.8*  PLT 257   BMET:   Basename 12/09/12 0452  NA 135  K 3.5  CL 99  CO2 30  GLUCOSE 100*  BUN 14  CREATININE 0.64  CALCIUM 9.2    PT/INR: No results found for this basename: LABPROT,INR in the last 72 hours ABG:  INR: Will add last result for INR, ABG once components are confirmed Will add last 4 CBG results once components are confirmed  Assessment/Plan:  1. CV - SR. On Norvasc 5 daily, HCTZ 25 daily, and Irbesartan 300 daily. 2.  Pulmonary - Chest tube removed yesterday.CXR this am shows an increase inright apical pneumothorax,small amount of subcutaneous emphysema on right. Monitor need for chest tube.Pathology results noted-not discussed with the patient 3.Psych-history of bipolar disorder. Recommended Depakote at hs but not ordered? Appreciate psychiatry's assistance. 4.DM-CBGs  126/96/117. Continue Metformin 500 bid 5.UA microscopic showed many bacteria. Final culture showed E coli >100,000. Continue cipro 6.Remove central line 7.Possible transfer to 2000 8.Consider NSAIDS to help with sciatica 9.Possible discharge in am if CXR stable  [name],[name] MPA-C 12/11/2012,8:12 AM

## 2012-12-11 NOTE — Progress Notes (Signed)
Paged [name] PA, as pt refused to have IV inserted after central line being d/c'd. [name]

## 2012-12-11 NOTE — Progress Notes (Signed)
Clinical Social Work Progress Note PSYCHIATRY SERVICE LINE 12/11/2012  Patient:  [name] [name]  Account:  [id number]  Admit Date:  12/04/2012  Clinical Social Worker:  Unk Lightning, LCSW  Date/Time:  12/11/2012 09:00 AM  Review of Patient  Overall Medical Condition:   Per chart review, patient possibly to dc tomorrow.   Participation Level:  Active  Participation Quality  Appropriate   Other Participation Quality:   Affect  Depressed   Cognitive  Appropriate   Reaction to Medications/Concerns:   Modes of Intervention  Support   Summary of Progress/Plan at Discharge   CSW met with patient at bedside. Patient laying in bed and watching tv. Patient reports that she just met with MD and she is not recovering as fast as she thought she would. Patient tearful during session and reports that she feels she is wasting her energy on keeping her family together but no one is supporting her. Patient reports that she believes in God and has a strong faith. Patient reports that she believes God will help her through this struggle but is just having a rough day. Patient reports that Chaplain really assisted her and made her feel better when she prayed for her. CSW paged chaplain to see if she could visit with patient today.    CSW confirmed patient not experiencing any SI or HI. Patient denies any SI/HI but reports feeling overwhelmed with medical wellbeing at this time. CSW will continue to follow.

## 2012-12-11 NOTE — Progress Notes (Signed)
Pt said she was not having a good day. She was concerned about dr's rpt about her condition this morning but was unclear about what was said. I got her nurse and she came to explain to pt and pt seemed to understand better and asked questions. Pt was thankful for explanation, for Chaplain visit and for prayer. She said she felt better. Marjory Lies Chaplain
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
## 2012-12-12 ENCOUNTER — Inpatient Hospital Stay (HOSPITAL_COMMUNITY): Payer: Medicaid Other

## 2012-12-12 LAB — GLUCOSE, CAPILLARY
Glucose-Capillary: 113 mg/dL — ABNORMAL HIGH (ref 70–99)
Glucose-Capillary: 129 mg/dL — ABNORMAL HIGH (ref 70–99)

## 2012-12-12 MED ORDER — LOPERAMIDE HCL 2 MG PO CAPS
2.0000 mg | ORAL_CAPSULE | ORAL | Status: DC | PRN
  Administered 2012-12-12: 2 mg via ORAL
  Filled 2012-12-12: qty 1

## 2012-12-12 NOTE — Progress Notes (Signed)
Clinical Social Work Progress Note PSYCHIATRY SERVICE LINE 12/12/2012  Patient:  NGAN QUALLS  Account:  [id number]  Admit Date:  12/04/2012  Clinical Social Worker:  Unk Lightning, LCSW  Date/Time:  12/12/2012 10:20 AM  Review of Patient  Overall Medical Condition:   Patient reports she spoke with MD regarding dc today. Patient now feels she needs to wait one more day to dc.   Participation Level:  Active  Participation Quality  Appropriate   Other Participation Quality:   Affect  Anxious   Cognitive  Appropriate   Reaction to Medications/Concerns:   None reported   Modes of Intervention  Support   Summary of Progress/Plan at Discharge   CSW met with patient at bedside. Patient laying in bed but restless. Patient worried about conversation with MD. Patient feels she cannot dc today and needs one extra day. CSW provided RN with this information to communicate with MD regarding dc date.    Patient reports she was feeling better this morning but recently had diarrhea, hot flashes and feels overwhelmed. CSW and patient discussed if this was a panic attack or if it was medically related. Patient at first states it is medically related but later reports that she could be anxious about dc. CSW and patient discussed dc plans and patient reports that she will live with dtr. CSW explained scheduled appointment to see psychiatrist at [facility] on Monday morning. Patient reports she will get her friend to provide transportation. Patient able to create a plan and reports that anxiety will subside after she starts feeling better.    Patient engaged throughout conversation and able to use problem solving skills in order to create a plan. Patient needs to develop coping skills and learn to reduce her anxiety which she will be able to complete on outpatient basis. Outpatient appointment scheduled and placed on AVS. CSW is signing off but available if needed.

## 2012-12-12 NOTE — Progress Notes (Addendum)
                   [address]            [place] [address]          [phone]    8 Days Post-Op Procedure(s) (LRB): VIDEO BRONCHOSCOPY (N/A) VIDEO ASSISTED THORACOSCOPY (VATS)/WEDGE RESECTION (Right) LOBECTOMY (Right)  Subjective: Patient with loose stools and incisional pain this am  Objective: Vital signs in last 24 hours: Temp:  [97.4 F (36.3 C)-98.7 F (37.1 C)] 98.4 F (36.9 C) (01/23 0731) Pulse Rate:  [95-100] 95  (01/23 0024) Cardiac Rhythm:  [-] Sinus tachycardia (01/23 0400) Resp:  [12-25] 21  (01/23 0400) BP: (115-122)/(53-74) 119/74 mmHg (01/23 0400) SpO2:  [96 %-98 %] 98 % (01/23 0400)    Intake/Output from previous day: 01/22 0701 - 01/23 0700 In: 1320 [P.O.:1320] Out: -    Physical Exam:  Cardiovascular: RRR Pulmonary: Clear to auscultation on left; slightly diminshed right apex ; no rales, wheezes, or rhonchi. Abdomen: Soft, non tender, bowel sounds present. Extremities: No  lower extremity edema. Wounds: Clean and dry.  No erythema or signs of infection.   Lab Results: CBC: No results found for this basename: WBC:2,HGB:2,HCT:2,PLT:2 in the last 72 hours BMET:  No results found for this basename: NA:2,K:2,CL:2,CO2:2,GLUCOSE:2,BUN:2,CREATININE:2,CALCIUM:2 in the last 72 hours  PT/INR: No results found for this basename: LABPROT,INR in the last 72 hours ABG:  INR: Will add last result for INR, ABG once components are confirmed Will add last 4 CBG results once components are confirmed  Assessment/Plan:  1. CV - SR. On Norvasc 5 daily, HCTZ 25 daily, and Irbesartan 300 daily. 2.  Pulmonary - CXR this am appears to show a stable right apical pneumothorax and trace amount of subcutaneous emphysema on right.  3.Psych-history of bipolar disorder. Recommended Depakote at hs but not ordered? Appreciate psychiatry's assistance. 4.DM-CBGs 125/107/113. Continue Metformin 500 bid 5.UA microscopic showed many bacteria. Final culture  showed E coli >100,000. Continue cipro 6.Loose stools-will give Imodium. Is on antibiotics 7.Will discuss discharge disposition with Dr. Cristie Hem MPA-C 12/12/2012,7:51 AM    Chest xray stable, feels well Follow xray next week Check cfiff before going home Home this afternoon I have seen and examined [name] [name] and agree with the above assessment  and plan.  Delight Ovens MD Beeper [phone] Office [phone] 12/12/2012 9:39 AM

## 2012-12-12 NOTE — Progress Notes (Signed)
Gave patient discharge orders and orders for follow up x-ray  And appointment with her MDs , patient not happy about discharge called Lowella Dandy PA  While waiting for return call patient  States that she would go home  Waiting for her daughter to come  Will discharge home out with wheelchair as ordered

## 2012-12-16 ENCOUNTER — Other Ambulatory Visit: Payer: Self-pay | Admitting: *Deleted

## 2012-12-16 DIAGNOSIS — R918 Other nonspecific abnormal finding of lung field: Secondary | ICD-10-CM

## 2012-12-19 ENCOUNTER — Encounter: Payer: Self-pay | Admitting: Cardiothoracic Surgery

## 2012-12-19 ENCOUNTER — Ambulatory Visit (INDEPENDENT_AMBULATORY_CARE_PROVIDER_SITE_OTHER): Payer: Self-pay | Admitting: Cardiothoracic Surgery

## 2012-12-19 ENCOUNTER — Ambulatory Visit
Admission: RE | Admit: 2012-12-19 | Discharge: 2012-12-19 | Disposition: A | Discharge status: Home / Self Care | Payer: Medicaid Other | Source: Ambulatory Visit | Attending: Cardiothoracic Surgery | Admitting: Cardiothoracic Surgery

## 2012-12-19 ENCOUNTER — Encounter: Payer: Self-pay | Admitting: *Deleted

## 2012-12-19 VITALS — BP 134/85 | HR 96 | Temp 97.8°F | Resp 20 | Ht 64.0 in | Wt 153.0 lb

## 2012-12-19 DIAGNOSIS — Z9889 Other specified postprocedural states: Secondary | ICD-10-CM

## 2012-12-19 DIAGNOSIS — Z09 Encounter for follow-up examination after completed treatment for conditions other than malignant neoplasm: Secondary | ICD-10-CM

## 2012-12-19 DIAGNOSIS — C3410 Malignant neoplasm of upper lobe, unspecified bronchus or lung: Secondary | ICD-10-CM

## 2012-12-19 DIAGNOSIS — C3490 Malignant neoplasm of unspecified part of unspecified bronchus or lung: Secondary | ICD-10-CM

## 2012-12-19 DIAGNOSIS — Z902 Acquired absence of lung [part of]: Secondary | ICD-10-CM

## 2012-12-19 DIAGNOSIS — R918 Other nonspecific abnormal finding of lung field: Secondary | ICD-10-CM

## 2012-12-19 NOTE — Progress Notes (Signed)
EGFR Mutation Not detected   ALK Rearrangement Not detected   ROS 1

## 2012-12-19 NOTE — Patient Instructions (Signed)
See Dr Ramon Dredge GI about follow up colonoscopy/ upper endoscopy See me in 3 months

## 2012-12-19 NOTE — Progress Notes (Signed)
[address]            [place] [address]          [phone]       Sharan Mcenaney [facility] Medical Record #[id number] Date of Birth: [birth year]  [name], MD [name], MD  Chief Complaint:   PostOp Follow Up Visit  12/04/2012   OPERATIVE REPORT  PREOPERATIVE DIAGNOSIS: Slowly enlarging right upper lobe lung nodule.  POSTOPERATIVE DIAGNOSIS: Carcinoma of the right upper lobe.  PROCEDURE PERFORMED: Bronchoscopy, right video-assisted thoracoscopy,  mini thoracotomy, wedge resection, right upper lobe and completion right  upper lobectomy with lymph node sampling, placement of On-Q device.    History of Present Illness:     Doing well post op, no sob, incision pain improved Not smoking No urinary tract symptoms   History  Smoking status  . Current Some Day Smoker -- 10 years  . Types: Cigarettes  Smokeless tobacco  . Never Used    Comment: Smokes 1/2 pack per week       Allergies  Allergen Reactions  . Haldol (Haloperidol Decanoate) Other (See Comments)    tongue sweeling  . Morphine And Related Hives    hives  . Penicillins Rash    Current Outpatient Prescriptions  Medication Sig Dispense Refill  . albuterol (PROVENTIL HFA;VENTOLIN HFA) 108 (90 BASE) MCG/ACT inhaler Inhale 2 puffs into the lungs every 6 (six) hours as needed. For shortness of breath      . ALPRAZolam (XANAX) 1 MG tablet Take 0.5 mg by mouth at bedtime as needed. For sleep and anxiety      . aspirin 81 MG tablet Take 81 mg by mouth daily.       . cholecalciferol (VITAMIN D) 1000 UNITS tablet Take 1,000 Units by mouth once a week. On Mondays      . dexlansoprazole (DEXILANT) 60 MG capsule Take 60 mg by mouth daily.      Marland Kitchen gabapentin (NEURONTIN) 400 MG capsule Take 400 mg by mouth 3 (three) times daily.       Marland Kitchen HYDROcodone-acetaminophen (LORTAB) 10-500 MG per tablet Take 1 tablet by mouth every 6 (six) hours as needed. Pain      .  loratadine (CLARITIN) 10 MG tablet Take 10 mg by mouth daily.      . metFORMIN (GLUCOPHAGE) 500 MG tablet Take 500 mg by mouth 2 (two) times daily with a meal.       . mirtazapine (REMERON) 15 MG tablet Take 15 mg by mouth at bedtime.      . Olmesartan-Amlodipine-HCTZ (TRIBENZOR) 40-10-25 MG TABS Take 1 tablet by mouth daily.      Marland Kitchen sulfamethoxazole-trimethoprim (BACTRIM DS,SEPTRA DS) 800-160 MG per tablet Take 1 tablet by mouth once.           Physical Exam: BP 134/85  Pulse 96  Temp 97.8 F (36.6 C) (Oral)  Resp 20  Ht 5\' 4"  (1.626 m)  Wt 153 lb (69.4 kg)  BMI 26.26 kg/m2  SpO2 98%  General appearance: alert and cooperative Neurologic: intact Heart: regular rate and rhythm, S1, S2 normal, no murmur, click, rub or gallop Lungs: clear to auscultation bilaterally and normal percussion bilaterally Abdomen: soft, non-tender; bowel sounds normal; no masses,  no organomegaly Extremities: extremities normal, atraumatic, no cyanosis or edema and Homans sign is negative, no sign of DVT Wound: well healed  rt chest sites   Diagnostic Studies & Laboratory data:         Recent Radiology Findings: Dg Chest 2 View  12/19/2012  *RADIOLOGY REPORT*  Clinical Data: Follow up VATS.  CHEST - 2 VIEW  Comparison: 12/12/2012.  Findings: The previously seen right apical pneumothorax has resolved.  Mild right pleural apical thickening.  Unchanged elevation of the right hemidiaphragm.  The left lung appears clear. Cardiopericardial silhouette within normal limits. Right greater than left AC joint osteoarthritis.  IMPRESSION: Resolved right pneumothorax.   Original Report Authenticated By: Andreas Newport, M.D.     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 Patient has seen Dr [name] in Past, see reports it is time for her to colonocspy again. Will have her see Dr [name] in follow up. See me back in 3 months with chest xray Presented at [facility] Thoracic Oncology Conference- no additional rx recommended for Lung Ca , follow up only needed. Patient never got cipro at d/c , Dr Mayford Knife was kind enough to start bactrium      Keren Alverio B 12/19/2012 10:10 AM

## 2012-12-24 ENCOUNTER — Other Ambulatory Visit: Payer: Self-pay | Admitting: *Deleted

## 2012-12-24 DIAGNOSIS — C3490 Malignant neoplasm of unspecified part of unspecified bronchus or lung: Secondary | ICD-10-CM

## 2012-12-26 ENCOUNTER — Emergency Department (HOSPITAL_COMMUNITY)
Admission: EM | Admit: 2012-12-26 | Discharge: 2012-12-26 | Disposition: A | Discharge status: Home / Self Care | Payer: Medicaid Other | Attending: Emergency Medicine | Admitting: Emergency Medicine

## 2012-12-26 ENCOUNTER — Ambulatory Visit
Admission: RE | Admit: 2012-12-26 | Discharge: 2012-12-26 | Disposition: A | Discharge status: Home / Self Care | Payer: Medicaid Other | Source: Ambulatory Visit | Attending: Cardiothoracic Surgery | Admitting: Cardiothoracic Surgery

## 2012-12-26 ENCOUNTER — Emergency Department (HOSPITAL_COMMUNITY): Payer: Medicaid Other

## 2012-12-26 ENCOUNTER — Ambulatory Visit (INDEPENDENT_AMBULATORY_CARE_PROVIDER_SITE_OTHER): Payer: Self-pay | Admitting: Cardiothoracic Surgery

## 2012-12-26 ENCOUNTER — Encounter (HOSPITAL_COMMUNITY): Payer: Self-pay | Admitting: Cardiology

## 2012-12-26 ENCOUNTER — Encounter: Payer: Self-pay | Admitting: Cardiothoracic Surgery

## 2012-12-26 VITALS — BP 150/96 | HR 96 | Ht 64.0 in | Wt 153.0 lb

## 2012-12-26 DIAGNOSIS — C3490 Malignant neoplasm of unspecified part of unspecified bronchus or lung: Secondary | ICD-10-CM

## 2012-12-26 DIAGNOSIS — F319 Bipolar disorder, unspecified: Secondary | ICD-10-CM | POA: Insufficient documentation

## 2012-12-26 DIAGNOSIS — I10 Essential (primary) hypertension: Secondary | ICD-10-CM | POA: Insufficient documentation

## 2012-12-26 DIAGNOSIS — R011 Cardiac murmur, unspecified: Secondary | ICD-10-CM | POA: Insufficient documentation

## 2012-12-26 DIAGNOSIS — F17200 Nicotine dependence, unspecified, uncomplicated: Secondary | ICD-10-CM | POA: Insufficient documentation

## 2012-12-26 DIAGNOSIS — R630 Anorexia: Secondary | ICD-10-CM | POA: Insufficient documentation

## 2012-12-26 DIAGNOSIS — Z8719 Personal history of other diseases of the digestive system: Secondary | ICD-10-CM | POA: Insufficient documentation

## 2012-12-26 DIAGNOSIS — R112 Nausea with vomiting, unspecified: Secondary | ICD-10-CM | POA: Insufficient documentation

## 2012-12-26 DIAGNOSIS — F411 Generalized anxiety disorder: Secondary | ICD-10-CM | POA: Insufficient documentation

## 2012-12-26 DIAGNOSIS — Z8739 Personal history of other diseases of the musculoskeletal system and connective tissue: Secondary | ICD-10-CM | POA: Insufficient documentation

## 2012-12-26 DIAGNOSIS — Z902 Acquired absence of lung [part of]: Secondary | ICD-10-CM

## 2012-12-26 DIAGNOSIS — Z09 Encounter for follow-up examination after completed treatment for conditions other than malignant neoplasm: Secondary | ICD-10-CM

## 2012-12-26 DIAGNOSIS — K5900 Constipation, unspecified: Secondary | ICD-10-CM | POA: Insufficient documentation

## 2012-12-26 DIAGNOSIS — E119 Type 2 diabetes mellitus without complications: Secondary | ICD-10-CM | POA: Insufficient documentation

## 2012-12-26 DIAGNOSIS — R197 Diarrhea, unspecified: Secondary | ICD-10-CM | POA: Insufficient documentation

## 2012-12-26 DIAGNOSIS — Z9889 Other specified postprocedural states: Secondary | ICD-10-CM

## 2012-12-26 DIAGNOSIS — Z79899 Other long term (current) drug therapy: Secondary | ICD-10-CM | POA: Insufficient documentation

## 2012-12-26 DIAGNOSIS — Z7982 Long term (current) use of aspirin: Secondary | ICD-10-CM | POA: Insufficient documentation

## 2012-12-26 DIAGNOSIS — R109 Unspecified abdominal pain: Secondary | ICD-10-CM

## 2012-12-26 LAB — URINALYSIS, ROUTINE W REFLEX MICROSCOPIC
Glucose, UA: NEGATIVE mg/dL
Hgb urine dipstick: NEGATIVE
Ketones, ur: NEGATIVE mg/dL
Protein, ur: NEGATIVE mg/dL
Urobilinogen, UA: 0.2 mg/dL (ref 0.0–1.0)

## 2012-12-26 LAB — CBC WITH DIFFERENTIAL/PLATELET
Basophils Absolute: 0 10*3/uL (ref 0.0–0.1)
Eosinophils Absolute: 0.2 10*3/uL (ref 0.0–0.7)
Eosinophils Relative: 3 % (ref 0–5)
HCT: 37 % (ref 36.0–46.0)
Lymphocytes Relative: 44 % (ref 12–46)
MCH: 29.5 pg (ref 26.0–34.0)
MCV: 89.6 fL (ref 78.0–100.0)
Monocytes Absolute: 0.5 10*3/uL (ref 0.1–1.0)
Platelets: 406 10*3/uL — ABNORMAL HIGH (ref 150–400)
RDW: 14 % (ref 11.5–15.5)

## 2012-12-26 LAB — HEPATIC FUNCTION PANEL
AST: 24 U/L (ref 0–37)
Albumin: 3.9 g/dL (ref 3.5–5.2)
Alkaline Phosphatase: 77 U/L (ref 39–117)
Total Bilirubin: 0.2 mg/dL — ABNORMAL LOW (ref 0.3–1.2)

## 2012-12-26 LAB — BASIC METABOLIC PANEL
CO2: 25 mEq/L (ref 19–32)
Calcium: 9.8 mg/dL (ref 8.4–10.5)
Creatinine, Ser: 0.51 mg/dL (ref 0.50–1.10)
GFR calc non Af Amer: >90 mL/min (ref >90)
Glucose, Bld: 97 mg/dL (ref 70–99)
Sodium: 141 mEq/L (ref 135–145)

## 2012-12-26 LAB — LIPASE, BLOOD: Lipase: 12 U/L (ref 11–59)

## 2012-12-26 LAB — URINE MICROSCOPIC-ADD ON

## 2012-12-26 MED ORDER — SODIUM CHLORIDE 0.9 % IV SOLN
1000.0000 mL | INTRAVENOUS | Status: DC
  Administered 2012-12-26: 1000 mL via INTRAVENOUS

## 2012-12-26 MED ORDER — IOHEXOL 300 MG/ML  SOLN
25.0000 mL | INTRAMUSCULAR | Status: AC
  Administered 2012-12-26 (×2): 25 mL via ORAL

## 2012-12-26 MED ORDER — HYDROCODONE-ACETAMINOPHEN 5-325 MG PO TABS: 1.0000 | ORAL_TABLET | Freq: Four times a day (QID) | ORAL | Status: DC | PRN

## 2012-12-26 MED ORDER — SODIUM CHLORIDE 0.9 % IV SOLN
1000.0000 mL | Freq: Once | INTRAVENOUS | Status: AC
  Administered 2012-12-26: 1000 mL via INTRAVENOUS

## 2012-12-26 MED ORDER — IOHEXOL 300 MG/ML  SOLN
80.0000 mL | Freq: Once | INTRAMUSCULAR | Status: AC | PRN
  Administered 2012-12-26: 80 mL via INTRAVENOUS

## 2012-12-26 MED ORDER — HYDROMORPHONE HCL PF 1 MG/ML IJ SOLN
0.5000 mg | INTRAMUSCULAR | Status: DC | PRN
  Administered 2012-12-26: 0.5 mg via INTRAVENOUS
  Filled 2012-12-26: qty 1

## 2012-12-26 MED ORDER — ONDANSETRON HCL 4 MG/2ML IJ SOLN
4.0000 mg | Freq: Once | INTRAMUSCULAR | Status: AC
  Administered 2012-12-26: 4 mg via INTRAVENOUS
  Filled 2012-12-26: qty 2

## 2012-12-26 NOTE — ED Provider Notes (Signed)
History     CSN: [id number]  Arrival date & time 12/26/12  1359   First MD Initiated Contact with Patient 12/26/12 1505      Chief Complaint  Patient presents with  . Abdominal Pain  . Nausea    (Consider location/radiation/quality/duration/timing/severity/associated sxs/prior treatment) HPI Comments: Yesterday she started with a few bowel movements.  She started developing cramping abdominal pain.  The pain progressed.  She started having some loose green stools today and the abdominal pain increased.  She has had trouble with diverticulitis in the past.  This feels somewhat similar.  She is scheduled to see her GI doctor again on the 18th.   [name] a 62 y.o. female presenting with abdominal pain. The history is provided by the patient.  Abdominal Pain The primary symptoms of the illness include abdominal pain, vomiting (once today, three times yesterday ) and diarrhea. The primary symptoms of the illness do not include fever. The current episode started yesterday. The onset of the illness was gradual.  Additional symptoms associated with the illness include anorexia, constipation and urgency. Symptoms associated with the illness do not include chills or frequency.  The pain is sharp in her lower abdomen.  She feels like something is twisting inside her.  Past Medical History  Diagnosis Date  . Hypertension   . Diabetes mellitus   . Bipolar 1 disorder   . Heart murmur   . Sciatica   . Arthritis   . Hepatitis     hep c ?, pt states thinks  . Anxiety   . Diverticulosis     Past Surgical History  Procedure Date  . Facial tumor removal   . Brain tumor removal   . Brain surgery     in [place]  . Video bronchoscopy 12/04/2012    Procedure: VIDEO BRONCHOSCOPY;  Surgeon: Delight Ovens, MD;  Location: [facility] OR;  Service: Thoracic;  Laterality: N/A;  . Video assisted thoracoscopy (vats)/wedge resection 12/04/2012    Procedure: VIDEO ASSISTED THORACOSCOPY (VATS)/WEDGE  RESECTION;  Surgeon: Delight Ovens, MD;  Location: [facility] OR;  Service: Thoracic;  Laterality: Right;  . Lobectomy 12/04/2012    Procedure: LOBECTOMY;  Surgeon: Delight Ovens, MD;  Location: [facility] OR;  Service: Thoracic;  Laterality: Right;  completion of right upper lobectomy and lymph node disection, placement of on q pump    Family History  Problem Relation Age of Onset  . Hypertension Father     deceased  . Diabetes Father   . Heart disease Father   . Hyperlipidemia Father   . Hypertension Mother   . Hyperlipidemia Mother   . Diabetes Mother   . Hypertension Sister   . Hyperlipidemia Sister   . Hypertension Brother   . Hyperlipidemia Brother     History  Substance Use Topics  . Smoking status: Current Some Day Smoker -- 10 years    Types: Cigarettes  . Smokeless tobacco: Never Used     Comment: Smokes 1/2 pack per week  . Alcohol Use: No     Comment: quit 72yrs ago    OB History    Grav Para Term Preterm Abortions TAB SAB Ect Mult Living                  Review of Systems  Constitutional: Negative for fever and chills.  Gastrointestinal: Positive for vomiting (once today, three times yesterday ), abdominal pain, diarrhea, constipation and anorexia.  Genitourinary: Positive for urgency. Negative for frequency.  All other systems reviewed and are negative.    Allergies  Haldol; Morphine and related; and Penicillins  Home Medications   Current Outpatient Rx  Name  Route  Sig  Dispense  Refill  . ALBUTEROL SULFATE HFA 108 (90 BASE) MCG/ACT IN AERS   Inhalation   Inhale 2 puffs into the lungs every 6 (six) hours as needed. For shortness of breath         . ALPRAZOLAM 1 MG PO TABS   Oral   Take 0.5 mg by mouth at bedtime as needed. For sleep and anxiety         . ASPIRIN 81 MG PO TABS   Oral   Take 81 mg by mouth daily.          Marland Kitchen VITAMIN D 1000 UNITS PO TABS   Oral   Take 1,000 Units by mouth once a week. On Mondays         . DEXLANSOPRAZOLE  60 MG PO CPDR   Oral   Take 60 mg by mouth daily.         Marland Kitchen GABAPENTIN 400 MG PO CAPS   Oral   Take 400 mg by mouth 3 (three) times daily.          Marland Kitchen HYDROCODONE-ACETAMINOPHEN 10-500 MG PO TABS   Oral   Take 1 tablet by mouth every 6 (six) hours as needed. Pain         . LORATADINE 10 MG PO TABS   Oral   Take 10 mg by mouth daily.         Marland Kitchen METFORMIN HCL 500 MG PO TABS   Oral   Take 500 mg by mouth 2 (two) times daily with a meal.          . MIRTAZAPINE 15 MG PO TABS   Oral   Take 15 mg by mouth at bedtime.         Marland Kitchen OLMESARTAN-AMLODIPINE-HCTZ 40-10-25 MG PO TABS   Oral   Take 1 tablet by mouth daily.           BP 149/84  Pulse 92  Temp 97.3 F (36.3 C) (Oral)  Resp 18  SpO2 94%  Physical Exam  Nursing note and vitals reviewed. Constitutional: She appears well-developed and well-nourished. No distress.  HENT:  Head: Normocephalic and atraumatic.  Right Ear: External ear normal.  Left Ear: External ear normal.  Eyes: Conjunctivae normal are normal. Right eye exhibits no discharge. Left eye exhibits no discharge. No scleral icterus.  Neck: Neck supple. No tracheal deviation present.  Cardiovascular: Normal rate, regular rhythm and intact distal pulses.   Pulmonary/Chest: Effort normal and breath sounds normal. No stridor. No respiratory distress. She has no wheezes. She has no rales.  Abdominal: Soft. Bowel sounds are normal. She exhibits no distension and no mass. There is tenderness in the epigastric area and left lower quadrant. There is no rebound and no guarding. No hernia.  Musculoskeletal: She exhibits no edema and no tenderness.  Neurological: She is alert. She has normal strength. No sensory deficit. Cranial nerve deficit:  no gross defecits noted. She exhibits normal muscle tone. She displays no seizure activity. Coordination normal.  Skin: Skin is warm and dry. No rash noted.  Psychiatric: She has a normal mood and affect.    ED Course   Procedures (including critical care time)  Labs Reviewed  CBC WITH DIFFERENTIAL - Abnormal; Notable for the following:    Platelets 406 (*)  All other components within normal limits  URINALYSIS, ROUTINE W REFLEX MICROSCOPIC - Abnormal; Notable for the following:    APPearance HAZY (*)     Leukocytes, UA SMALL (*)     All other components within normal limits  URINE MICROSCOPIC-ADD ON - Abnormal; Notable for the following:    Squamous Epithelial / LPF MANY (*)     All other components within normal limits  HEPATIC FUNCTION PANEL - Abnormal; Notable for the following:    Total Bilirubin 0.2 (*)     All other components within normal limits  BASIC METABOLIC PANEL  LIPASE, BLOOD   Dg Chest 2 View  12/26/2012  *RADIOLOGY REPORT*  Clinical Data: Lung cancer.  CHEST - 2 VIEW  Comparison: 12/19/2012.  Findings: The cardiac silhouette, mediastinal and hilar contours are stable.  Stable elevation of the right hilum.  Minimal linear left basilar scarring changes and stable elevation right hemidiaphragm.  No acute pulmonary findings.  IMPRESSION:  Stable surgical changes involving the right lung.  No new/acute pulmonary findings.   Original Report Authenticated By: Rudie Meyer, M.D.    Ct Abdomen Pelvis W Contrast  12/26/2012  *RADIOLOGY REPORT*  Clinical Data: Abdominal pain, nausea and vomiting.  CT ABDOMEN AND PELVIS WITH CONTRAST  Technique:  Multidetector CT imaging of the abdomen and pelvis was performed following the standard protocol during bolus administration of intravenous contrast.  Contrast: 80mL OMNIPAQUE IOHEXOL 300 MG/ML  SOLN  Comparison: Chest and two views abdomen 12/26/2012.  CT abdomen and pelvis 07/17/2012.  Findings: Small right pleural effusion is identified.  There is no left pleural effusion or pericardial effusion.  The lung bases are clear.  The gallbladder, spleen, adrenal glands, pancreas, biliary tree and kidneys appear normal. Possible small flash filling hemangioma in  the dome of the liver seen on the most recent study is not visible today.  Uterus, adnexa and urinary bladder are unremarkable.  The stomach, small and large bowel and appendix appear normal.  No focal bony abnormality is identified.  IMPRESSION:  1.  Small right pleural effusion. 2.  No acute finding in the abdomen or pelvis.   Original Report Authenticated By: Holley Dexter, M.D.    Dg Abd Acute W/chest  12/26/2012  *RADIOLOGY REPORT*  Clinical Data: Abdominal pain and nausea.  ACUTE ABDOMEN SERIES (ABDOMEN 2 VIEW & CHEST 1 VIEW)  Comparison: Chest x-ray 12/26/2012.  Findings: The upright chest x-ray is stable.  Minimal streaky areas of subsegmental atelectasis are noted.  Two views of the abdomen demonstrate an unremarkable bowel gas pattern.  No findings for obstruction and/or perforation.  The soft tissue shadows are maintained.  No free air.  No worrisome calcifications.  The bony structures are intact.  IMPRESSION:  1.  Streaky areas of atelectasis but no infiltrates or effusions. 2.  No plain film findings for an acute abdominal process.   Original Report Authenticated By: Rudie Meyer, M.D.      MDM  The patient has been having recurrent issues with abdominal pain. There emergency department evaluation does not suggest any acute abnormality. There is no sign of mass, abscess or infection on the CT scan. Her laboratory tests are unremarkable. The patient has been seeing a GI Dr. At this time I feel it is reasonable to discharge her home with outpatient followup.  Patient has been instructed to call her GI Dr. and primary care Dr.       Celene Kras, MD 12/26/12 2129

## 2012-12-26 NOTE — ED Notes (Signed)
Pt reports llq abd pain that started yesterday along with nausea. Reports increased diarrhea and vomiting this morning. Reports she took nausea medication at home. Reports she thinks she has a hx of diverticulitis. Denies any painful urination.

## 2012-12-26 NOTE — ED Notes (Signed)
Patient transported to CT 

## 2012-12-26 NOTE — ED Notes (Signed)
IV team re-paged.  °

## 2012-12-26 NOTE — Progress Notes (Signed)
[address]            [place] [address]          [phone]         Jyla Hopf [facility] Medical Record #[id number] Date of Birth: [birth year]  [name], MD [name], MD  Chief Complaint:   PostOp Follow Up Visit  12/04/2012   OPERATIVE REPORT  PREOPERATIVE DIAGNOSIS: Slowly enlarging right upper lobe lung nodule.  POSTOPERATIVE DIAGNOSIS: Carcinoma of the right upper lobe.  PROCEDURE PERFORMED: Bronchoscopy, right video-assisted thoracoscopy,  mini thoracotomy, wedge resection, right upper lobe and completion right  upper lobectomy with lymph node sampling, placement of On-Q device.    History of Present Illness:     Doing well post op, no sob, incision pain improved Not smoking No urinary tract symptoms   History  Smoking status  . Current Some Day Smoker -- 10 years  . Types: Cigarettes  Smokeless tobacco  . Never Used    Comment: Smokes 1/2 pack per week       Allergies  Allergen Reactions  . Haldol (Haloperidol Decanoate) Other (See Comments)    tongue sweeling  . Morphine And Related Hives    hives  . Penicillins Rash    No current facility-administered medications for this visit.   Current Outpatient Prescriptions  Medication Sig Dispense Refill  . albuterol (PROVENTIL HFA;VENTOLIN HFA) 108 (90 BASE) MCG/ACT inhaler Inhale 2 puffs into the lungs every 6 (six) hours as needed. For shortness of breath      . ALPRAZolam (XANAX) 1 MG tablet Take 0.5 mg by mouth at bedtime as needed. For sleep and anxiety      . aspirin 81 MG tablet Take 81 mg by mouth daily.       . cholecalciferol (VITAMIN D) 1000 UNITS tablet Take 1,000 Units by mouth once a week. On Mondays      . dexlansoprazole (DEXILANT) 60 MG capsule Take 60 mg by mouth daily.      Marland Kitchen gabapentin (NEURONTIN) 400 MG capsule Take 400 mg by mouth 3 (three) times daily.       Marland Kitchen HYDROcodone-acetaminophen (LORTAB) 10-500 MG  per tablet Take 1 tablet by mouth every 6 (six) hours as needed. Pain      . loratadine (CLARITIN) 10 MG tablet Take 10 mg by mouth daily.      . metFORMIN (GLUCOPHAGE) 500 MG tablet Take 500 mg by mouth 2 (two) times daily with a meal.       . mirtazapine (REMERON) 15 MG tablet Take 15 mg by mouth at bedtime.      . Olmesartan-Amlodipine-HCTZ (TRIBENZOR) 40-10-25 MG TABS Take 1 tablet by mouth daily.       Facility-Administered Medications Ordered in Other Visits  Medication Dose Route Frequency Provider Last Rate Last Dose  . 0.9 %  sodium chloride infusion  1,000 mL Intravenous Once Celene Kras, MD       Followed by  . 0.9 %  sodium chloride infusion  1,000 mL Intravenous Continuous Celene Kras, MD      . HYDROmorphone (DILAUDID) injection 0.5 mg  0.5 mg Intravenous Q30 min PRN Celene Kras, MD      . iohexol (OMNIPAQUE) 300 MG/ML solution 25 mL  25 mL Oral Q1 Hr x 2 Medication Radiologist, MD      . ondansetron (ZOFRAN) injection 4 mg  4 mg Intravenous Once Celene Kras, MD           Physical Exam: BP 150/96  Pulse 96  Ht 5\' 4"  (1.626 m)  Wt 153 lb (69.4 kg)  BMI 26.26 kg/m2  SpO2 97%  General appearance: alert and cooperative Neurologic: intact Heart: regular rate and rhythm, S1, S2 normal, no murmur, click, rub or gallop Lungs: clear to auscultation bilaterally and normal percussion bilaterally Abdomen: soft, non-tender; bowel sounds normal; no masses,  no organomegaly Extremities: extremities normal, atraumatic, no cyanosis or edema and Homans sign is negative, no sign of DVT Wound: well healed rt chest sites   Diagnostic Studies & Laboratory data:         Recent Radiology Findings: Dg Chest 2 View  12/26/2012  *RADIOLOGY REPORT*  Clinical Data: Lung cancer.  CHEST - 2 VIEW  Comparison: 12/19/2012.  Findings: The cardiac silhouette, mediastinal and hilar contours are stable.  Stable elevation of the right hilum.  Minimal linear left basilar scarring changes and stable  elevation right hemidiaphragm.  No acute pulmonary findings.  IMPRESSION:  Stable surgical changes involving the right lung.  No new/acute pulmonary findings.   Original Report Authenticated By: Rudie Meyer, M.D.    Dg Abd Acute W/chest  12/26/2012  *RADIOLOGY REPORT*  Clinical Data: Abdominal pain and nausea.  ACUTE ABDOMEN SERIES (ABDOMEN 2 VIEW & CHEST 1 VIEW)  Comparison: Chest x-ray 12/26/2012.  Findings: The upright chest x-ray is stable.  Minimal streaky areas of subsegmental atelectasis are noted.  Two views of the abdomen demonstrate an unremarkable bowel gas pattern.  No findings for obstruction and/or perforation.  The soft tissue shadows are maintained.  No free air.  No worrisome calcifications.  The bony structures are intact.  IMPRESSION:  1.  Streaky areas of atelectasis but no infiltrates or effusions. 2.  No plain film findings for an acute abdominal process.   Original Report Authenticated By: Rudie Meyer, M.D.     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 Patient has seen Dr [name] in Past, see reports it is time for her to colonocspy again. Will have her see Dr [name] in follow up. See me back in 3 months with chest xray Presented at [facility]acic Oncology Conference- no additional rx recommended for Lung Ca , follow up only needed.     Hansika Leaming B 12/26/2012 5:17  PM

## 2012-12-26 NOTE — ED Notes (Signed)
IV team paged. call returned. will see pt

## 2012-12-28 ENCOUNTER — Emergency Department (HOSPITAL_COMMUNITY): Payer: Medicaid Other

## 2012-12-28 ENCOUNTER — Emergency Department (HOSPITAL_COMMUNITY)
Admission: EM | Admit: 2012-12-28 | Discharge: 2012-12-28 | Disposition: A | Discharge status: Home / Self Care | Payer: Medicaid Other | Attending: Emergency Medicine | Admitting: Emergency Medicine

## 2012-12-28 DIAGNOSIS — Z902 Acquired absence of lung [part of]: Secondary | ICD-10-CM | POA: Insufficient documentation

## 2012-12-28 DIAGNOSIS — F17200 Nicotine dependence, unspecified, uncomplicated: Secondary | ICD-10-CM | POA: Insufficient documentation

## 2012-12-28 DIAGNOSIS — Z8739 Personal history of other diseases of the musculoskeletal system and connective tissue: Secondary | ICD-10-CM | POA: Insufficient documentation

## 2012-12-28 DIAGNOSIS — R52 Pain, unspecified: Secondary | ICD-10-CM

## 2012-12-28 DIAGNOSIS — R112 Nausea with vomiting, unspecified: Secondary | ICD-10-CM

## 2012-12-28 DIAGNOSIS — I10 Essential (primary) hypertension: Secondary | ICD-10-CM | POA: Insufficient documentation

## 2012-12-28 DIAGNOSIS — Z8719 Personal history of other diseases of the digestive system: Secondary | ICD-10-CM | POA: Insufficient documentation

## 2012-12-28 DIAGNOSIS — Z8619 Personal history of other infectious and parasitic diseases: Secondary | ICD-10-CM | POA: Insufficient documentation

## 2012-12-28 DIAGNOSIS — R011 Cardiac murmur, unspecified: Secondary | ICD-10-CM | POA: Insufficient documentation

## 2012-12-28 DIAGNOSIS — R1013 Epigastric pain: Secondary | ICD-10-CM | POA: Insufficient documentation

## 2012-12-28 DIAGNOSIS — E119 Type 2 diabetes mellitus without complications: Secondary | ICD-10-CM | POA: Insufficient documentation

## 2012-12-28 DIAGNOSIS — M51379 Other intervertebral disc degeneration, lumbosacral region without mention of lumbar back pain or lower extremity pain: Secondary | ICD-10-CM | POA: Insufficient documentation

## 2012-12-28 DIAGNOSIS — F319 Bipolar disorder, unspecified: Secondary | ICD-10-CM | POA: Insufficient documentation

## 2012-12-28 DIAGNOSIS — M5137 Other intervertebral disc degeneration, lumbosacral region: Secondary | ICD-10-CM | POA: Insufficient documentation

## 2012-12-28 DIAGNOSIS — Z85118 Personal history of other malignant neoplasm of bronchus and lung: Secondary | ICD-10-CM | POA: Insufficient documentation

## 2012-12-28 DIAGNOSIS — M129 Arthropathy, unspecified: Secondary | ICD-10-CM | POA: Insufficient documentation

## 2012-12-28 DIAGNOSIS — F411 Generalized anxiety disorder: Secondary | ICD-10-CM | POA: Insufficient documentation

## 2012-12-28 DIAGNOSIS — Z79899 Other long term (current) drug therapy: Secondary | ICD-10-CM | POA: Insufficient documentation

## 2012-12-28 DIAGNOSIS — G8929 Other chronic pain: Secondary | ICD-10-CM | POA: Insufficient documentation

## 2012-12-28 DIAGNOSIS — R197 Diarrhea, unspecified: Secondary | ICD-10-CM | POA: Insufficient documentation

## 2012-12-28 LAB — URINE MICROSCOPIC-ADD ON

## 2012-12-28 LAB — URINALYSIS, ROUTINE W REFLEX MICROSCOPIC
Glucose, UA: NEGATIVE mg/dL
Hgb urine dipstick: NEGATIVE
Protein, ur: 100 mg/dL — AB

## 2012-12-28 MED ORDER — LOPERAMIDE HCL 2 MG PO CAPS: 2.0000 mg | ORAL_CAPSULE | Freq: Four times a day (QID) | ORAL | Status: DC | PRN

## 2012-12-28 MED ORDER — ONDANSETRON HCL 4 MG PO TABS: 4.0000 mg | ORAL_TABLET | Freq: Four times a day (QID) | ORAL | Status: DC

## 2012-12-28 NOTE — ED Notes (Signed)
Patient denies pain after medication. Vitals are stable. Patient called daughter to come and pick her up.

## 2012-12-28 NOTE — ED Notes (Signed)
PIV removed from left forearm. Catheter intact.

## 2012-12-29 ENCOUNTER — Encounter (HOSPITAL_COMMUNITY): Payer: Self-pay | Admitting: Emergency Medicine

## 2012-12-29 ENCOUNTER — Emergency Department (HOSPITAL_COMMUNITY): Payer: Medicaid Other

## 2012-12-29 ENCOUNTER — Inpatient Hospital Stay (HOSPITAL_COMMUNITY)
Admission: EM | Admit: 2012-12-29 | Discharge: 2013-01-03 | DRG: 372 | Disposition: A | Discharge status: Home / Self Care | Payer: Medicaid Other | Attending: Internal Medicine | Admitting: Internal Medicine

## 2012-12-29 DIAGNOSIS — E876 Hypokalemia: Secondary | ICD-10-CM | POA: Diagnosis present

## 2012-12-29 DIAGNOSIS — C3410 Malignant neoplasm of upper lobe, unspecified bronchus or lung: Secondary | ICD-10-CM | POA: Diagnosis present

## 2012-12-29 DIAGNOSIS — K3184 Gastroparesis: Secondary | ICD-10-CM | POA: Diagnosis present

## 2012-12-29 DIAGNOSIS — R6510 Systemic inflammatory response syndrome (SIRS) of non-infectious origin without acute organ dysfunction: Secondary | ICD-10-CM | POA: Diagnosis present

## 2012-12-29 DIAGNOSIS — F1910 Other psychoactive substance abuse, uncomplicated: Secondary | ICD-10-CM | POA: Diagnosis present

## 2012-12-29 DIAGNOSIS — R45851 Suicidal ideations: Secondary | ICD-10-CM

## 2012-12-29 DIAGNOSIS — I10 Essential (primary) hypertension: Secondary | ICD-10-CM | POA: Diagnosis present

## 2012-12-29 DIAGNOSIS — F411 Generalized anxiety disorder: Secondary | ICD-10-CM | POA: Diagnosis present

## 2012-12-29 DIAGNOSIS — F319 Bipolar disorder, unspecified: Secondary | ICD-10-CM | POA: Diagnosis present

## 2012-12-29 DIAGNOSIS — Z880 Allergy status to penicillin: Secondary | ICD-10-CM

## 2012-12-29 DIAGNOSIS — Z8719 Personal history of other diseases of the digestive system: Secondary | ICD-10-CM

## 2012-12-29 DIAGNOSIS — R000 Tachycardia, unspecified: Secondary | ICD-10-CM

## 2012-12-29 DIAGNOSIS — R509 Fever, unspecified: Secondary | ICD-10-CM | POA: Diagnosis present

## 2012-12-29 DIAGNOSIS — Z7982 Long term (current) use of aspirin: Secondary | ICD-10-CM

## 2012-12-29 DIAGNOSIS — F17200 Nicotine dependence, unspecified, uncomplicated: Secondary | ICD-10-CM | POA: Diagnosis present

## 2012-12-29 DIAGNOSIS — F1010 Alcohol abuse, uncomplicated: Secondary | ICD-10-CM | POA: Diagnosis present

## 2012-12-29 DIAGNOSIS — E119 Type 2 diabetes mellitus without complications: Secondary | ICD-10-CM | POA: Diagnosis present

## 2012-12-29 DIAGNOSIS — Z902 Acquired absence of lung [part of]: Secondary | ICD-10-CM

## 2012-12-29 DIAGNOSIS — B1920 Unspecified viral hepatitis C without hepatic coma: Secondary | ICD-10-CM | POA: Diagnosis present

## 2012-12-29 DIAGNOSIS — IMO0002 Reserved for concepts with insufficient information to code with codable children: Secondary | ICD-10-CM

## 2012-12-29 DIAGNOSIS — R197 Diarrhea, unspecified: Secondary | ICD-10-CM

## 2012-12-29 DIAGNOSIS — F419 Anxiety disorder, unspecified: Secondary | ICD-10-CM

## 2012-12-29 DIAGNOSIS — Z79899 Other long term (current) drug therapy: Secondary | ICD-10-CM

## 2012-12-29 DIAGNOSIS — R1084 Generalized abdominal pain: Secondary | ICD-10-CM

## 2012-12-29 DIAGNOSIS — F439 Reaction to severe stress, unspecified: Secondary | ICD-10-CM

## 2012-12-29 DIAGNOSIS — M5430 Sciatica, unspecified side: Secondary | ICD-10-CM | POA: Diagnosis present

## 2012-12-29 DIAGNOSIS — Z885 Allergy status to narcotic agent status: Secondary | ICD-10-CM

## 2012-12-29 DIAGNOSIS — R1115 Cyclical vomiting syndrome unrelated to migraine: Secondary | ICD-10-CM

## 2012-12-29 DIAGNOSIS — A0472 Enterocolitis due to Clostridium difficile, not specified as recurrent: Principal | ICD-10-CM | POA: Diagnosis present

## 2012-12-29 DIAGNOSIS — Z888 Allergy status to other drugs, medicaments and biological substances status: Secondary | ICD-10-CM

## 2012-12-29 DIAGNOSIS — R112 Nausea with vomiting, unspecified: Secondary | ICD-10-CM

## 2012-12-29 DIAGNOSIS — F4310 Post-traumatic stress disorder, unspecified: Secondary | ICD-10-CM | POA: Diagnosis present

## 2012-12-29 LAB — CBC WITH DIFFERENTIAL/PLATELET
Basophils Absolute: 0 10*3/uL (ref 0.0–0.1)
Basophils Relative: 0 % (ref 0–1)
Eosinophils Absolute: 0 10*3/uL (ref 0.0–0.7)
Eosinophils Relative: 0 % (ref 0–5)
HCT: 36.5 % (ref 36.0–46.0)
MCHC: 32.6 g/dL (ref 30.0–36.0)
Monocytes Absolute: 1.6 10*3/uL — ABNORMAL HIGH (ref 0.1–1.0)
Neutro Abs: 6.2 10*3/uL (ref 1.7–7.7)
RDW: 13.8 % (ref 11.5–15.5)

## 2012-12-29 LAB — URINALYSIS, ROUTINE W REFLEX MICROSCOPIC
Nitrite: NEGATIVE
Protein, ur: 100 mg/dL — AB
Specific Gravity, Urine: 1.031 — ABNORMAL HIGH (ref 1.005–1.030)
Urobilinogen, UA: 1 mg/dL (ref 0.0–1.0)

## 2012-12-29 LAB — PREGNANCY, URINE: Preg Test, Ur: NEGATIVE

## 2012-12-29 LAB — BASIC METABOLIC PANEL
BUN: 20 mg/dL (ref 6–23)
CO2: 24 mEq/L (ref 19–32)
Chloride: 107 mEq/L (ref 96–112)
Glucose, Bld: 116 mg/dL — ABNORMAL HIGH (ref 70–99)
Potassium: 3.4 mEq/L — ABNORMAL LOW (ref 3.5–5.1)

## 2012-12-29 LAB — HEPATIC FUNCTION PANEL
ALT: 28 U/L (ref 0–35)
AST: 38 U/L — ABNORMAL HIGH (ref 0–37)
Albumin: 3.9 g/dL (ref 3.5–5.2)
Total Protein: 7.1 g/dL (ref 6.0–8.3)

## 2012-12-29 LAB — ETHANOL: Alcohol, Ethyl (B): <11 mg/dL (ref 0–11)

## 2012-12-29 LAB — CBC
HCT: 36.7 % (ref 36.0–46.0)
Hemoglobin: 11.7 g/dL — ABNORMAL LOW (ref 12.0–15.0)
RBC: 4.07 MIL/uL (ref 3.87–5.11)
WBC: 9.1 10*3/uL (ref 4.0–10.5)

## 2012-12-29 LAB — RAPID URINE DRUG SCREEN, HOSP PERFORMED
Amphetamines: NOT DETECTED
Barbiturates: NOT DETECTED
Benzodiazepines: POSITIVE — AB
Cocaine: NOT DETECTED

## 2012-12-29 LAB — URINE MICROSCOPIC-ADD ON

## 2012-12-29 LAB — LIPASE, BLOOD: Lipase: 9 U/L — ABNORMAL LOW (ref 11–59)

## 2012-12-29 MED ORDER — ACETAMINOPHEN 650 MG RE SUPP: 650.0000 mg | Freq: Four times a day (QID) | RECTAL | Status: DC | PRN

## 2012-12-29 MED ORDER — RISPERIDONE 1 MG PO TABS
1.0000 mg | ORAL_TABLET | Freq: Two times a day (BID) | ORAL | Status: DC
  Administered 2012-12-29 – 2013-01-03 (×11): 1 mg via ORAL
  Filled 2012-12-29 (×12): qty 1

## 2012-12-29 MED ORDER — ACETAMINOPHEN 325 MG PO TABS
650.0000 mg | ORAL_TABLET | Freq: Once | ORAL | Status: AC
  Administered 2012-12-29: 650 mg via ORAL
  Filled 2012-12-29: qty 2

## 2012-12-29 MED ORDER — IBUPROFEN 200 MG PO TABS
600.0000 mg | ORAL_TABLET | Freq: Once | ORAL | Status: AC
  Administered 2012-12-29: 600 mg via ORAL
  Filled 2012-12-29: qty 3

## 2012-12-29 MED ORDER — LACTATED RINGERS IV SOLN
INTRAVENOUS | Status: DC
  Administered 2012-12-29: 09:00:00 via INTRAVENOUS

## 2012-12-29 MED ORDER — OSELTAMIVIR PHOSPHATE 75 MG PO CAPS
75.0000 mg | ORAL_CAPSULE | Freq: Two times a day (BID) | ORAL | Status: DC
  Filled 2012-12-29 (×3): qty 1

## 2012-12-29 MED ORDER — FAMOTIDINE IN NACL 20-0.9 MG/50ML-% IV SOLN
20.0000 mg | Freq: Once | INTRAVENOUS | Status: AC
Start: 2012-12-29 — End: 2012-12-29
  Administered 2012-12-29: 20 mg via INTRAVENOUS
  Filled 2012-12-29: qty 50

## 2012-12-29 MED ORDER — IRBESARTAN 300 MG PO TABS
300.0000 mg | ORAL_TABLET | Freq: Every day | ORAL | Status: DC
  Administered 2012-12-29 – 2013-01-03 (×6): 300 mg via ORAL
  Filled 2012-12-29 (×6): qty 1

## 2012-12-29 MED ORDER — PANTOPRAZOLE SODIUM 40 MG PO TBEC
40.0000 mg | DELAYED_RELEASE_TABLET | Freq: Every day | ORAL | Status: DC
  Administered 2012-12-29 – 2013-01-03 (×6): 40 mg via ORAL
  Filled 2012-12-29 (×6): qty 1

## 2012-12-29 MED ORDER — LORATADINE 10 MG PO TABS
10.0000 mg | ORAL_TABLET | Freq: Every morning | ORAL | Status: DC
  Administered 2012-12-30 – 2013-01-03 (×5): 10 mg via ORAL
  Filled 2012-12-29 (×5): qty 1

## 2012-12-29 MED ORDER — GABAPENTIN 400 MG PO CAPS
400.0000 mg | ORAL_CAPSULE | Freq: Three times a day (TID) | ORAL | Status: DC
  Administered 2012-12-29 – 2013-01-03 (×13): 400 mg via ORAL
  Filled 2012-12-29 (×16): qty 1

## 2012-12-29 MED ORDER — SODIUM CHLORIDE 0.9 % IV SOLN
INTRAVENOUS | Status: DC
  Administered 2012-12-29: 21:00:00 via INTRAVENOUS
  Filled 2012-12-29 (×4): qty 1000

## 2012-12-29 MED ORDER — SODIUM CHLORIDE 0.9 % IV SOLN
500.0000 mg | Freq: Three times a day (TID) | INTRAVENOUS | Status: DC
  Administered 2012-12-29 – 2012-12-30 (×2): 500 mg via INTRAVENOUS
  Filled 2012-12-29 (×3): qty 500

## 2012-12-29 MED ORDER — LORAZEPAM 2 MG/ML IJ SOLN
0.5000 mg | Freq: Once | INTRAMUSCULAR | Status: AC
  Administered 2012-12-29: 0.5 mg via INTRAVENOUS
  Filled 2012-12-29: qty 1

## 2012-12-29 MED ORDER — METOCLOPRAMIDE HCL 5 MG/ML IJ SOLN
10.0000 mg | Freq: Once | INTRAMUSCULAR | Status: AC
  Administered 2012-12-29: 10 mg via INTRAVENOUS
  Filled 2012-12-29: qty 2

## 2012-12-29 MED ORDER — LOPERAMIDE HCL 2 MG PO CAPS
2.0000 mg | ORAL_CAPSULE | Freq: Once | ORAL | Status: AC
  Administered 2012-12-29: 2 mg via ORAL
  Filled 2012-12-29: qty 1

## 2012-12-29 MED ORDER — VANCOMYCIN HCL IN DEXTROSE 1-5 GM/200ML-% IV SOLN
1000.0000 mg | Freq: Two times a day (BID) | INTRAVENOUS | Status: DC
  Administered 2012-12-29 – 2012-12-30 (×2): 1000 mg via INTRAVENOUS
  Filled 2012-12-29 (×3): qty 200

## 2012-12-29 MED ORDER — LORAZEPAM 1 MG PO TABS
1.0000 mg | ORAL_TABLET | Freq: Three times a day (TID) | ORAL | Status: DC
  Administered 2012-12-29 – 2012-12-30 (×3): 1 mg via ORAL
  Filled 2012-12-29 (×4): qty 1

## 2012-12-29 MED ORDER — ASPIRIN 81 MG PO CHEW
81.0000 mg | CHEWABLE_TABLET | Freq: Every morning | ORAL | Status: DC
  Administered 2012-12-30 – 2013-01-03 (×5): 81 mg via ORAL
  Filled 2012-12-29 (×5): qty 1

## 2012-12-29 MED ORDER — ENOXAPARIN SODIUM 40 MG/0.4ML ~~LOC~~ SOLN
40.0000 mg | SUBCUTANEOUS | Status: DC
  Administered 2012-12-29 – 2013-01-02 (×5): 40 mg via SUBCUTANEOUS
  Filled 2012-12-29 (×6): qty 0.4

## 2012-12-29 MED ORDER — ONDANSETRON HCL 4 MG PO TABS: 4.0000 mg | ORAL_TABLET | Freq: Four times a day (QID) | ORAL | Status: DC | PRN

## 2012-12-29 MED ORDER — GI COCKTAIL ~~LOC~~
30.0000 mL | Freq: Once | ORAL | Status: AC
  Administered 2012-12-29: 30 mL via ORAL
  Filled 2012-12-29: qty 30

## 2012-12-29 MED ORDER — LACTATED RINGERS IV BOLUS (SEPSIS)
500.0000 mL | Freq: Once | INTRAVENOUS | Status: AC
  Administered 2012-12-29: 500 mL via INTRAVENOUS

## 2012-12-29 MED ORDER — MIRTAZAPINE 15 MG PO TBDP
15.0000 mg | ORAL_TABLET | Freq: Every day | ORAL | Status: DC
  Administered 2012-12-30 (×2): 15 mg via ORAL
  Filled 2012-12-29 (×3): qty 1

## 2012-12-29 MED ORDER — HYDROMORPHONE HCL PF 1 MG/ML IJ SOLN
1.0000 mg | Freq: Once | INTRAMUSCULAR | Status: AC
  Administered 2012-12-29: 1 mg via INTRAVENOUS
  Filled 2012-12-29: qty 1

## 2012-12-29 MED ORDER — ONDANSETRON HCL 4 MG/2ML IJ SOLN
4.0000 mg | Freq: Once | INTRAMUSCULAR | Status: AC
  Administered 2012-12-29: 4 mg via INTRAVENOUS
  Filled 2012-12-29: qty 2

## 2012-12-29 MED ORDER — ONDANSETRON HCL 4 MG/2ML IJ SOLN
4.0000 mg | Freq: Four times a day (QID) | INTRAMUSCULAR | Status: DC | PRN
  Administered 2012-12-29: 4 mg via INTRAVENOUS
  Filled 2012-12-29: qty 2

## 2012-12-29 MED ORDER — IBUPROFEN 800 MG PO TABS
800.0000 mg | ORAL_TABLET | Freq: Once | ORAL | Status: AC
  Administered 2012-12-29: 800 mg via ORAL
  Filled 2012-12-29: qty 1

## 2012-12-29 MED ORDER — LACTATED RINGERS IV BOLUS (SEPSIS)
1000.0000 mL | Freq: Once | INTRAVENOUS | Status: AC
  Administered 2012-12-29: 1000 mL via INTRAVENOUS

## 2012-12-29 MED ORDER — PANTOPRAZOLE SODIUM 40 MG IV SOLR
40.0000 mg | Freq: Once | INTRAVENOUS | Status: AC
  Administered 2012-12-29: 40 mg via INTRAVENOUS
  Filled 2012-12-29: qty 40

## 2012-12-29 MED ORDER — LORAZEPAM 2 MG/ML IJ SOLN
1.0000 mg | Freq: Once | INTRAMUSCULAR | Status: AC
  Administered 2012-12-29: 1 mg via INTRAVENOUS
  Filled 2012-12-29: qty 1

## 2012-12-29 MED ORDER — ALPRAZOLAM 0.5 MG PO TABS
0.5000 mg | ORAL_TABLET | Freq: Two times a day (BID) | ORAL | Status: DC | PRN
  Administered 2012-12-30: 0.5 mg via ORAL
  Filled 2012-12-29: qty 1

## 2012-12-29 MED ORDER — AMLODIPINE BESYLATE 10 MG PO TABS
10.0000 mg | ORAL_TABLET | Freq: Every day | ORAL | Status: DC
  Administered 2012-12-29 – 2013-01-03 (×6): 10 mg via ORAL
  Filled 2012-12-29 (×6): qty 1

## 2012-12-29 MED ORDER — MIRTAZAPINE 15 MG PO TABS
15.0000 mg | ORAL_TABLET | Freq: Every day | ORAL | Status: DC
  Administered 2012-12-31 – 2013-01-02 (×3): 15 mg via ORAL
  Filled 2012-12-29 (×6): qty 1

## 2012-12-29 MED ORDER — SODIUM CHLORIDE 0.9 % IV BOLUS (SEPSIS)
1000.0000 mL | Freq: Once | INTRAVENOUS | Status: AC
  Administered 2012-12-29: 1000 mL via INTRAVENOUS

## 2012-12-29 MED ORDER — DIPHENHYDRAMINE HCL 50 MG/ML IJ SOLN
12.5000 mg | Freq: Once | INTRAMUSCULAR | Status: AC
  Administered 2012-12-29: 12.5 mg via INTRAVENOUS
  Filled 2012-12-29: qty 1

## 2012-12-29 MED ORDER — ACETAMINOPHEN 325 MG PO TABS: 650.0000 mg | ORAL_TABLET | Freq: Four times a day (QID) | ORAL | Status: DC | PRN

## 2012-12-29 MED ORDER — HYDROMORPHONE HCL PF 1 MG/ML IJ SOLN
0.5000 mg | INTRAMUSCULAR | Status: DC | PRN
  Administered 2012-12-29 – 2013-01-03 (×21): 0.5 mg via INTRAVENOUS
  Filled 2012-12-29 (×21): qty 1

## 2012-12-29 NOTE — Progress Notes (Signed)
ANTIBIOTIC CONSULT NOTE - INITIAL  Pharmacy Consult for Vancomycin Indication: SIRS  Allergies  Allergen Reactions  . Haldol (Haloperidol Decanoate) Other (See Comments)    tongue sweeling  . Morphine And Related Hives    hives  . Penicillins Rash    Patient Measurements:   Adjusted Body Weight: Last documented weight 69kg (12/26/12)  Vital Signs: Temp: 99.1 F (37.3 C) (02/09 1944) Temp src: Oral (02/09 1944) BP: 191/94 mmHg (02/09 1944) Pulse Rate: 113 (02/09 1944)  Labs:  Recent Labs  12/29/12 0949 12/29/12 1418  WBC 9.1 10.5  HGB 11.7* 11.9*  PLT 358 343  CREATININE 0.58  --    Microbiology: No results found for this or any previous visit (from the past 720 hour(s)).  Medical History: Past Medical History  Diagnosis Date  . Hypertension   . Diabetes mellitus   . Bipolar 1 disorder   . Heart murmur   . Sciatica   . Arthritis   . Hepatitis     hep c ?, pt states thinks  . Anxiety   . Diverticulosis     Medications:  Anti-infectives   Start     Dose/Rate Route Frequency Ordered Stop   12/29/12 2200  oseltamivir (TAMIFLU) capsule 75 mg     75 mg Oral 2 times daily 12/29/12 1947 01/03/13 2159   12/29/12 2200  imipenem-cilastatin (PRIMAXIN) 500 mg in sodium chloride 0.9 % 100 mL IVPB     500 mg 200 mL/hr over 30 Minutes Intravenous 3 times per day 12/29/12 1947       Assessment: 62 yof admit 2/9 with concern for SIRS.  Pharmacy assistance requested for Vancomycin dosing.  Primaxin and Tamiflu were also started per MD dosing on 2/9 SCr wnl; CrCl > 100 ml/min (CG) WBC 10.5, Tm 101.8  Goal of Therapy:  Vancomycin trough level 15-20 mcg/ml  Plan:   Vancomycin 1g IV q12h.  Measure Vanc trough at steady state.  Follow up renal fxn and culture results.   Lynann Beaver PharmD, BCPS Pager [phone] 12/29/2012 8:16 PM

## 2012-12-29 NOTE — H&P (Signed)
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 The patient has been complaining of nausea, vomiting, diarrhea, and abdominal pain that began last Thursday, 12/26/2012. The patient came to the emergency department on the day. The patient was sent home with an antiemetic and Lortab. The patient states that she did take the Lortab, but this did not help her pain. She continued to have vomiting, abdominal pain, and diarrhea. As a result, she came to the emergency department on 12/28/2012. The patient had a CT of the abdomen and pelvis on 01/23/2013 with IV contrast that showed a small right pleural effusion without any other acute findings. When she came back 12/28/2012, repeat CT of the abdomen and pelvis without contrast did not show any significant change. Of note, there was no obstruction, no mesenteric inflammation. The patient's blood work revealed WBC 10.5, serum creatinine 0.58, lactic acid 1.1, normal lipase, AST 38, ALT 28, and urine drug screen positive for cannabis, benzos, and opiates. The patient was about to be sent home in the morning of 12/29/2012 from the emergency department. During that time, the patient stated that she went to see a mental health professional. She had suicidal ideation. She stated that she went to run in front of a car on the highway. In addition, the patient has a history of heroin use and was previously exonerated from a murder charge for self defense. Apparently, she killed her husband.  The patient states that she has had 5-6 loose stools prior to coming to the emergency department. She cannot say whether she has had any hematochezia or melena. She has not had any hematemesis or  coffee grounds emesis. The patient complains of subjective fevers and chills. She denies any chest pain, shortness breath, palpitations, syncope, dysuria, hematuria. After the patient expressed suicidal ideation, she was noted to have a fever of 101.64F in the emergency department. Urinalysis did not suggest UTI. Lactic acid was 1.1.  The patient did complain of generalized myalgias and arthralgias without any headaches, sore throat, rashes, or synovitis. She denies any other intravenous drug use. Assessment/Plan: Fever -Etiology is unclear -I. will order blood cultures x2 sets -Start patient on empiric tamiflu and antibiotics pending culture data -Other possible etiologies include C. difficile colitis as patient has had recent antibiotics for her surgery and was treated for UTI postoperatively. -Check C. difficile PCR -Influenza PCR -Check HIV test -Chest x-ray is negative Abdominal pain -Unclear etiology -C. difficile PCR -Urine pregnancy test Intractable vomiting and diarrhea -Lipase is negative -Unclear if patient has used any other illegal substances not detected by routine drug screen -Anti-emetics -No recent travels to suggest exotic infection -IV fluids Suicidal ideation/history of bipolar disorder -Psychiatry has been consulted-        Past Medical History  Diagnosis Date  . Hypertension   . Diabetes mellitus   . Bipolar 1 disorder   . Heart murmur   . Sciatica   . Arthritis   . Hepatitis     hep c ?, pt states thinks  . Anxiety   . Diverticulosis    Past Surgical History  Procedure Laterality Date  . Facial tumor removal    . Brain tumor removal    . Brain surgery  in [place]  . Video bronchoscopy  12/04/2012    Procedure: VIDEO BRONCHOSCOPY;  Surgeon: Delight Ovens, MD;  Location: [facility] OR;  Service: Thoracic;  Laterality: N/A;  . Video assisted thoracoscopy (vats)/wedge resection  12/04/2012    Procedure: VIDEO ASSISTED THORACOSCOPY (VATS)/WEDGE  RESECTION;  Surgeon: Delight Ovens, MD;  Location: [facility] OR;  Service: Thoracic;  Laterality: Right;  . Lobectomy  12/04/2012    Procedure: LOBECTOMY;  Surgeon: Delight Ovens, MD;  Location: [facility] OR;  Service: Thoracic;  Laterality: Right;  completion of right upper lobectomy and lymph node disection, placement of on q pump   Social History:  reports that she has been smoking Cigarettes.  She has been smoking about 0.00 packs per day for the past 10 years. She has never used smokeless tobacco. She reports that she uses illicit drugs (Hydrocodone and Marijuana). She reports that she does not drink alcohol.   Family History  Problem Relation Age of Onset  . Hypertension Father     deceased  . Diabetes Father   . Heart disease Father   . Hyperlipidemia Father   . Hypertension Mother   . Hyperlipidemia Mother   . Diabetes Mother   . Hypertension Sister   . Hyperlipidemia Sister   . Hypertension Brother   . Hyperlipidemia Brother      Allergies  Allergen Reactions  . Haldol (Haloperidol Decanoate) Other (See Comments)    tongue sweeling  . Morphine And Related Hives    hives  . Penicillins Rash      Prior to Admission medications   Medication Sig Start Date End Date Taking? Authorizing Provider  albuterol (PROVENTIL HFA;VENTOLIN HFA) 108 (90 BASE) MCG/ACT inhaler Inhale 2 puffs into the lungs every 6 (six) hours as needed (for shortness of breath). For shortness of breath   Yes Historical Provider, MD  ALPRAZolam Prudy Feeler) 1 MG tablet Take 0.5 mg by mouth 2 (two) times daily as needed (for sleep or anxiety). For sleep and anxiety   Yes Historical Provider, MD  aspirin 81 MG tablet Take 81 mg by mouth every morning.    Yes Historical Provider, MD  bismuth subsalicylate (PEPTO BISMOL) 262 MG/15ML suspension Take 15 mLs by mouth every 6 (six) hours as needed for indigestion (for diarrhea or indigestion).   Yes Historical Provider, MD  dexlansoprazole (DEXILANT) 60 MG capsule Take 60  mg by mouth every morning.    Yes Historical Provider, MD  ergocalciferol (VITAMIN D2) 50000 UNITS capsule Take 50,000 Units by mouth once a week.   Yes Historical Provider, MD  gabapentin (NEURONTIN) 400 MG capsule Take 400 mg by mouth 3 (three) times daily.    Yes Historical Provider, MD  HYDROcodone-acetaminophen (NORCO/VICODIN) 5-325 MG per tablet Take 1-2 tablets by mouth every 6 (six) hours as needed for pain (for pain). 12/26/12  Yes Celene Kras, MD  loratadine (CLARITIN) 10 MG tablet Take 10 mg by mouth every morning.    Yes Historical Provider, MD  metFORMIN (GLUCOPHAGE) 500 MG tablet Take 500 mg by mouth 2 (two) times daily with a meal.    Yes Historical Provider, MD  mirtazapine (REMERON) 15 MG tablet Take 15 mg by mouth at bedtime.   Yes Historical Provider, MD  Olmesartan-Amlodipine-HCTZ (TRIBENZOR) 40-10-25 MG TABS Take 1 tablet by mouth every evening.    Yes Historical Provider, MD  ondansetron (ZOFRAN) 4 MG tablet Take 4 mg by mouth every 6 (six) hours as needed (for nausea or vomiting). 12/28/12  Yes Sunnie Nielsen, MD    Review of Systems:  Constitutional:  No weight loss, night sweats, Fevers, chills, fatigue.  Head&Eyes: No headache.  No vision loss.  No eye pain or scotoma ENT:  No Difficulty swallowing,Tooth/dental problems,Sore throat,  No ear ache, post nasal drip,  Cardio-vascular:  No chest pain, Orthopnea, PND, swelling in lower extremities,  dizziness, palpitations  GI:  No  abdominal pain, nausea, vomiting, diarrhea, loss of appetite, hematochezia, melena, heartburn, indigestion, Resp:  No shortness of breath with exertion or at rest. No cough. No coughing up of blood .No wheezing.No chest wall deformity  Skin:  no rash or lesions.  GU:  no dysuria, change in color of urine, no urgency or frequency. No flank pain.  Musculoskeletal:  No joint pain or swelling. No decreased range of motion. No back pain.  Psych:  No change in mood or affect. No depression or  anxiety. Neurologic: No headache, no dysesthesia, no focal weakness, no vision loss. No syncope  Physical Exam: Filed Vitals:   12/29/12 1240 12/29/12 1325 12/29/12 1345 12/29/12 1550  BP:   181/86 148/84  Pulse:   121 110  Temp: 99.7 F (37.6 C) 101.8 F (38.8 C) 101.3 F (38.5 C) 99.8 F (37.7 C)  TempSrc: Oral Oral Rectal Oral  Resp:   26 18  SpO2:   97% 100%   General:  A&O x 3, NAD, nontoxic, pleasant/cooperative Head/Eye: No conjunctival hemorrhage, no icterus, [place]/AT, No nystagmus ENT:  No icterus,  No thrush, good dentition, no pharyngeal exudate Neck:  No masses, no lymphadenpathy, no bruits CV:  RRR, no rub, no gallop, no S3 Lung:  CTAB, good air movement, no wheeze, no rhonchi Abdomen: soft/NT, +BS, nondistended, no peritoneal signs Ext: No cyanosis, No rashes, No petechiae, No lymphangitis, No edema Neuro: CNII-XII intact, strength 4/5 in bilateral upper and lower extremities, no dysmetria  Labs on Admission:  Basic Metabolic Panel:  Recent Labs Lab 12/26/12 1437 12/29/12 0949  NA 141 143  K 3.6 3.4*  CL 105 107  CO2 25 24  GLUCOSE 97 116*  BUN 10 20  CREATININE 0.51 0.58  CALCIUM 9.8 9.0   Liver Function Tests:  Recent Labs Lab 12/26/12 1437 12/29/12 1454  AST 24 38*  ALT 16 28  ALKPHOS 77 70  BILITOT 0.2* 0.7  PROT 7.3 7.1  ALBUMIN 3.9 3.9    Recent Labs Lab 12/26/12 1437 12/29/12 1454  LIPASE 12 9*   No results found for this basename: AMMONIA,  in the last 168 hours CBC:  Recent Labs Lab 12/26/12 1437 12/29/12 0949 12/29/12 1418  WBC 6.8 9.1 10.5  NEUTROABS 3.0  --  6.2  HGB 12.2 11.7* 11.9*  HCT 37.0 36.7 36.5  MCV 89.6 90.2 89.2  PLT 406* 358 343   Cardiac Enzymes: No results found for this basename: CKTOTAL, CKMB, CKMBINDEX, TROPONINI,  in the last 168 hours BNP: No components found with this basename: POCBNP,  CBG: No results found for this basename: GLUCAP,  in the last 168 hours  Radiological Exams on  Admission: Ct Abdomen Pelvis Wo Contrast  12/28/2012  *RADIOLOGY REPORT*  Clinical Data:  62 year old female with abdominal pain nausea and vomiting.  CT ABDOMEN AND PELVIS WITHOUT CONTRAST  Technique:  Multidetector CT imaging of the abdomen and pelvis was performed following the standard protocol without intravenous contrast.  Comparison:  CT abdomen and pelvis with contrast 12/26/2012.  Findings:  Interval significantly reduced right pleural effusion, now only trace.  No left pleural or pericardial effusion.  Stable cardiomegaly.  Lung bases essentially clear.  Stable visualized osseous structures.  Mild anterolisthesis L5 on S1 with facet degeneration again noted.  No pelvic free fluid.  Oral contrast in the rectum.  Negative noncontrast uterus and adnexa.  Contrast mixed with urine in the bladder.  Negative distal colon.  Left and distal transverse colon decompressed which probably accounts for the appearance of wall thickening.  No associated mesenteric inflammation.  Negative proximal transverse colon and hepatic flexure.  Normal appendix containing contrast.  No dilated small bowel.  Contrast in the stomach and duodenum which appear within normal limits.  Noncontrast liver, gallbladder, spleen, pancreas and adrenal glands are within normal limits.  Small volume of excrete contrast in nondilated renal collecting systems.  Kidneys within normal limits.  No hydroureter.  No abdominal free fluid.  No focal inflammatory stranding identified.  IMPRESSION: 1.  Decreased right pleural effusion, with only trace residual. 2.  No bowel obstruction, acute or inflammatory change identified in the abdomen or pelvis.  Normal appendix.   Original Report Authenticated By: Erskine Speed, M.D.    Dg Abd Acute W/chest  12/29/2012  *RADIOLOGY REPORT*  Clinical Data: Pain, fever  ACUTE ABDOMEN SERIES (ABDOMEN 2 VIEW & CHEST 1 VIEW)  Comparison: Chest x-ray of 12/26/2012  Findings: Staples are noted medially overlying the right  upper lung field.  No active infiltrate or effusion is seen.  Mild cardiomegaly is stable.  Supine and erect views of the abdomen show some residual contrast throughout the nondistended colon.  No bowel obstruction is seen. No free air is noted.  IMPRESSION:  1.  Cardiomegaly.  Postoperative change on the right.  No active lung disease. 2.  No bowel obstruction. No free air.  Residual barium in the nondistended colon.   Original Report Authenticated By: Dwyane Dee, M.D.         Time spent:70 minutes Code Status:   FULL Family Communication:   Family at bedside   Kirrah Mustin, DO  [facility] Pager [phone]  If 7PM-7AM, please contact night-coverage [email or web address] Password [facility] 12/29/2012, 5:59 PM

## 2012-12-29 NOTE — ED Notes (Signed)
telepsych paperwork faxed to specialist on call 

## 2012-12-29 NOTE — ED Notes (Signed)
Pt to be transferred to 5e at 1900 when sitter is available

## 2012-12-29 NOTE — ED Notes (Signed)
Pt reports thoughts of SI with plan to "jump in front of a moving car on the highway". Pt states that "things are not good at home" and that she has had these thoughts for the "past few days". MD aware.

## 2012-12-29 NOTE — ED Notes (Signed)
5e called x1 for report, RN to call back

## 2012-12-29 NOTE — ED Notes (Signed)
Pt c/o abd pain, vomiting and diarrhea, onset 0100. Pt here yesterday for same.

## 2012-12-29 NOTE — ED Notes (Addendum)
Patient rolling back and forth singing/crying/praying to Jesus very loudly.

## 2012-12-29 NOTE — ED Notes (Signed)
Pt wanded by security. 1 bag of pt belongings at nurse's station

## 2012-12-29 NOTE — ED Provider Notes (Signed)
History     CSN: [id number]  Arrival date & time 12/29/12  0355   First MD Initiated Contact with Patient 12/29/12 [phone]      Chief Complaint  Patient presents with  . Emesis    (Consider location/radiation/quality/duration/timing/severity/associated sxs/prior treatment) HPI [name] [name] is a 62 y.o. female with a past medical history significant for lung cancer and the right upper lobe she is status post vats, mini thoracotomy and wedge resection of that mass performed on 12/04/2012 by Dr. Tyrone Sage. Patient was seen in the emergency department 2/6 and had an extensive abdominal pain workup including a CT and labs. Patient was discharged home stable and in good condition after that visit with medications for vomiting and pain. Patient says over the course of the last day her pain has returned, pain is severe, she describes as grabbing and twisting, generalized abdominal pain, isn't associated with nausea vomiting and diarrhea, she's had multiple episodes of watery diarrhea is described as "black and green and watery", she did have some antibiotic treatment in January and was on Bactrim. Last bowel movement was 02 30 this morning.  CT surgery did raise a question of a possible GI malignancy however no evidence on PET scan was found abdominal malignancy. Patient was sent home with antiemetics and Imodium, she has not taken any of these medicines at home. She says she took a Percocet earlier which did not help.   Past Medical History  Diagnosis Date  . Hypertension   . Diabetes mellitus   . Bipolar 1 disorder   . Heart murmur   . Sciatica   . Arthritis   . Hepatitis     hep c ?, pt states thinks  . Anxiety   . Diverticulosis     Past Surgical History  Procedure Laterality Date  . Facial tumor removal    . Brain tumor removal    . Brain surgery      in [place]  . Video bronchoscopy  12/04/2012    Procedure: VIDEO BRONCHOSCOPY;  Surgeon: Delight Ovens, MD;  Location: [facility]  OR;  Service: Thoracic;  Laterality: N/A;  . Video assisted thoracoscopy (vats)/wedge resection  12/04/2012    Procedure: VIDEO ASSISTED THORACOSCOPY (VATS)/WEDGE RESECTION;  Surgeon: Delight Ovens, MD;  Location: [facility] OR;  Service: Thoracic;  Laterality: Right;  . Lobectomy  12/04/2012    Procedure: LOBECTOMY;  Surgeon: Delight Ovens, MD;  Location: [facility] OR;  Service: Thoracic;  Laterality: Right;  completion of right upper lobectomy and lymph node disection, placement of on q pump    Family History  Problem Relation Age of Onset  . Hypertension Father     deceased  . Diabetes Father   . Heart disease Father   . Hyperlipidemia Father   . Hypertension Mother   . Hyperlipidemia Mother   . Diabetes Mother   . Hypertension Sister   . Hyperlipidemia Sister   . Hypertension Brother   . Hyperlipidemia Brother     History  Substance Use Topics  . Smoking status: Current Some Day Smoker -- 10 years    Types: Cigarettes  . Smokeless tobacco: Never Used     Comment: Smokes 1/2 pack per week  . Alcohol Use: No     Comment: quit 87yrs ago    OB History   Grav Para Term Preterm Abortions TAB SAB Ect Mult Living                  Review of  Systems At least 10pt or greater review of systems completed and are negative except where specified in the HPI.  Allergies  Haldol; Morphine and related; and Penicillins  Home Medications   Current Outpatient Rx  Name  Route  Sig  Dispense  Refill  . albuterol (PROVENTIL HFA;VENTOLIN HFA) 108 (90 BASE) MCG/ACT inhaler   Inhalation   Inhale 2 puffs into the lungs every 6 (six) hours as needed (for shortness of breath). For shortness of breath         . ALPRAZolam (XANAX) 1 MG tablet   Oral   Take 0.5 mg by mouth 2 (two) times daily as needed (for sleep or anxiety). For sleep and anxiety         . aspirin 81 MG tablet   Oral   Take 81 mg by mouth every morning.          Marland Kitchen dexlansoprazole (DEXILANT) 60 MG capsule   Oral   Take  60 mg by mouth every morning.          . ergocalciferol (VITAMIN D2) 50000 UNITS capsule   Oral   Take 50,000 Units by mouth once a week.         . gabapentin (NEURONTIN) 400 MG capsule   Oral   Take 400 mg by mouth 3 (three) times daily.          Marland Kitchen HYDROcodone-acetaminophen (NORCO/VICODIN) 5-325 MG per tablet   Oral   Take 1-2 tablets by mouth every 6 (six) hours as needed for pain (for pain).         Marland Kitchen loperamide (IMODIUM) 2 MG capsule   Oral   Take 2 mg by mouth 4 (four) times daily as needed for diarrhea or loose stools (for diarrhea or loose stools).         . loratadine (CLARITIN) 10 MG tablet   Oral   Take 10 mg by mouth every morning.          . metFORMIN (GLUCOPHAGE) 500 MG tablet   Oral   Take 500 mg by mouth 2 (two) times daily with a meal.          . mirtazapine (REMERON) 15 MG tablet   Oral   Take 15 mg by mouth at bedtime.         . Olmesartan-Amlodipine-HCTZ (TRIBENZOR) 40-10-25 MG TABS   Oral   Take 1 tablet by mouth every evening.          . ondansetron (ZOFRAN) 4 MG tablet   Oral   Take 4 mg by mouth every 6 (six) hours as needed (for nausea or vomiting).           BP 160/118  Pulse 128  Temp(Src) 98.5 F (36.9 C) (Oral)  Resp 18  SpO2 98%  Physical Exam  Nursing notes reviewed.  Electronic medical record reviewed. VITAL SIGNS:   Filed Vitals:   12/29/12 0401 12/29/12 0653  BP: 160/118 143/79  Pulse: 128 96  Temp: 98.5 F (36.9 C)   TempSrc: Oral   Resp: 18 18  SpO2: 98% 97%   CONSTITUTIONAL: Awake, oriented, writhing on the bed HENT: Atraumatic, normocephalic, oral mucosa pink and moist, airway patent. Nares patent without drainage. External ears normal. EYES: Conjunctiva clear, EOMI, PERRLA NECK: Trachea midline, non-tender, supple CARDIOVASCULAR: Normal heart rate, Normal rhythm, No murmurs, rubs, gallops PULMONARY/CHEST: Clear to auscultation, no rhonchi, wheezes, or rales. Symmetrical breath sounds.  Non-tender. ABDOMINAL: Non-distended, soft, non-tender - no rebound or guarding.  BS normal. NEUROLOGIC: Non-focal, moving all four extremities, no gross sensory or motor deficits. EXTREMITIES: No clubbing, cyanosis, or edema SKIN: Warm, Dry, No erythema, No rash  ED Course  Procedures (including critical care time)  Labs Reviewed  URINALYSIS, ROUTINE W REFLEX MICROSCOPIC - Abnormal; Notable for the following:    Color, Urine AMBER (*)    APPearance TURBID (*)    Specific Gravity, Urine 1.031 (*)    Glucose, UA 100 (*)    Bilirubin Urine SMALL (*)    Protein, ur 100 (*)    All other components within normal limits  URINE MICROSCOPIC-ADD ON - Abnormal; Notable for the following:    Squamous Epithelial / LPF MANY (*)    Bacteria, UA MANY (*)    Crystals CA OXALATE CRYSTALS (*)    All other components within normal limits   Ct Abdomen Pelvis Wo Contrast  12/28/2012  *RADIOLOGY REPORT*  Clinical Data:  62 year old female with abdominal pain nausea and vomiting.  CT ABDOMEN AND PELVIS WITHOUT CONTRAST  Technique:  Multidetector CT imaging of the abdomen and pelvis was performed following the standard protocol without intravenous contrast.  Comparison:  CT abdomen and pelvis with contrast 12/26/2012.  Findings:  Interval significantly reduced right pleural effusion, now only trace.  No left pleural or pericardial effusion.  Stable cardiomegaly.  Lung bases essentially clear.  Stable visualized osseous structures.  Mild anterolisthesis L5 on S1 with facet degeneration again noted.  No pelvic free fluid.  Oral contrast in the rectum.  Negative noncontrast uterus and adnexa.  Contrast mixed with urine in the bladder.  Negative distal colon.  Left and distal transverse colon decompressed which probably accounts for the appearance of wall thickening.  No associated mesenteric inflammation.  Negative proximal transverse colon and hepatic flexure.  Normal appendix containing contrast.  No dilated small  bowel.  Contrast in the stomach and duodenum which appear within normal limits.  Noncontrast liver, gallbladder, spleen, pancreas and adrenal glands are within normal limits.  Small volume of excrete contrast in nondilated renal collecting systems.  Kidneys within normal limits.  No hydroureter.  No abdominal free fluid.  No focal inflammatory stranding identified.  IMPRESSION: 1.  Decreased right pleural effusion, with only trace residual. 2.  No bowel obstruction, acute or inflammatory change identified in the abdomen or pelvis.  Normal appendix.   Original Report Authenticated By: Erskine Speed, M.D.      1. Abdominal pain, generalized   2. Nausea and vomiting   3. Diarrhea   4. Suicidal ideation   5. Stress at home       MDM  [name] [name] is a 62 y.o. female presents with generalized abdominal pain, nausea vomiting and diarrhea similar to her presentation a few days ago. Patient has been taking Pepto-Bismol occasionally with no relief, she has not taken her prescription medicines including Imodium and Zofran for nausea and vomiting. Patient has not been taking in much by mouth fluid either. On reevaluation, the patient is feeling much better, she's no longer writhing on the bed her pain is gone. She is no longer nauseous and has not had any diarrhea.   [name] now complaining that she would like to talk to a mental health professional. Patient has a history of suicidal ideation and attempt 8 years ago in [place] where she swallowed a bunch of her pills. [name] concerned because she lives with her daughter and her daughter does not want her to come home, she thinks  that her grandchildren are frightened of her, and yesterday she told her daughter that she was going to run out in front of a car on the highway. She tells me I don't live too far from the highway so I can run out in front of a car. Patient also says she can take all of her medications, she says "I wouldn't cut myself" but "I  don't have a gun."  Patient has a remote history of drug use including heroin and crack cocaine but denies any current usage.  Is entirely possible now that the patient is not distracted by pain, but she is now more focused on her emotional problems, it is also possible that she's got some functional or psychogenic abdominal pain caused by her stress at home. We'll obtain some other labs as well as urine drug screen and ethanol level, transferred to TCU, and obtain a tele-psych for possible hospitalization secondary to suicidal ideation, depression.       Jones Skene, MD 12/29/12 0730

## 2012-12-29 NOTE — ED Provider Notes (Signed)
Pt signed out to me by dr Rulon Abide, now with increasing temp, acute abd series neg, had neg abd ct 2 days ago, abd exam without peritoneal findings--some mild epigastric tenderness, she notes diarrhea, will check c-diff and repeat cbc, tele-psych recc inpatient tx, act involved  Toy Baker, MD 12/29/12 1344

## 2012-12-29 NOTE — ED Notes (Signed)
AC called for sitter. None available at this time, will send sitter at 1900 to 5e

## 2012-12-30 ENCOUNTER — Encounter (HOSPITAL_COMMUNITY): Payer: Self-pay

## 2012-12-30 ENCOUNTER — Inpatient Hospital Stay (HOSPITAL_COMMUNITY): Payer: Medicaid Other

## 2012-12-30 DIAGNOSIS — F4310 Post-traumatic stress disorder, unspecified: Secondary | ICD-10-CM

## 2012-12-30 DIAGNOSIS — E876 Hypokalemia: Secondary | ICD-10-CM | POA: Diagnosis present

## 2012-12-30 DIAGNOSIS — R112 Nausea with vomiting, unspecified: Secondary | ICD-10-CM

## 2012-12-30 DIAGNOSIS — R197 Diarrhea, unspecified: Secondary | ICD-10-CM

## 2012-12-30 DIAGNOSIS — F411 Generalized anxiety disorder: Secondary | ICD-10-CM

## 2012-12-30 DIAGNOSIS — R45851 Suicidal ideations: Secondary | ICD-10-CM

## 2012-12-30 DIAGNOSIS — F1910 Other psychoactive substance abuse, uncomplicated: Secondary | ICD-10-CM

## 2012-12-30 DIAGNOSIS — R000 Tachycardia, unspecified: Secondary | ICD-10-CM | POA: Diagnosis present

## 2012-12-30 DIAGNOSIS — Z639 Problem related to primary support group, unspecified: Secondary | ICD-10-CM

## 2012-12-30 DIAGNOSIS — F319 Bipolar disorder, unspecified: Secondary | ICD-10-CM

## 2012-12-30 LAB — URINALYSIS, ROUTINE W REFLEX MICROSCOPIC
Glucose, UA: 100 mg/dL — AB
Ketones, ur: 40 mg/dL — AB
Leukocytes, UA: NEGATIVE
Nitrite: NEGATIVE
Specific Gravity, Urine: 1.025 (ref 1.005–1.030)
pH: 6 (ref 5.0–8.0)

## 2012-12-30 LAB — CBC
HCT: 38.6 % (ref 36.0–46.0)
MCHC: 33.2 g/dL (ref 30.0–36.0)
Platelets: 352 10*3/uL (ref 150–400)
RDW: 13.8 % (ref 11.5–15.5)

## 2012-12-30 LAB — COMPREHENSIVE METABOLIC PANEL
ALT: 32 U/L (ref 0–35)
Alkaline Phosphatase: 73 U/L (ref 39–117)
CO2: 26 mEq/L (ref 19–32)
Chloride: 98 mEq/L (ref 96–112)
GFR calc Af Amer: >90 mL/min (ref >90)
Glucose, Bld: 132 mg/dL — ABNORMAL HIGH (ref 70–99)
Potassium: 2.6 mEq/L — CL (ref 3.5–5.1)
Sodium: 138 mEq/L (ref 135–145)
Total Bilirubin: 1.1 mg/dL (ref 0.3–1.2)
Total Protein: 7.4 g/dL (ref 6.0–8.3)

## 2012-12-30 LAB — MAGNESIUM: Magnesium: 1.8 mg/dL (ref 1.5–2.5)

## 2012-12-30 LAB — INFLUENZA PANEL BY PCR (TYPE A & B): Influenza B By PCR: NEGATIVE

## 2012-12-30 LAB — URINE MICROSCOPIC-ADD ON

## 2012-12-30 MED ORDER — METRONIDAZOLE 500 MG PO TABS
500.0000 mg | ORAL_TABLET | Freq: Three times a day (TID) | ORAL | Status: DC
  Administered 2012-12-31 (×3): 500 mg via ORAL
  Filled 2012-12-30 (×5): qty 1

## 2012-12-30 MED ORDER — HYDROCODONE-ACETAMINOPHEN 5-325 MG PO TABS: 1.0000 | ORAL_TABLET | Freq: Four times a day (QID) | ORAL | Status: DC | PRN

## 2012-12-30 MED ORDER — VANCOMYCIN HCL IN DEXTROSE 1-5 GM/200ML-% IV SOLN
1000.0000 mg | Freq: Two times a day (BID) | INTRAVENOUS | Status: DC
  Filled 2012-12-30: qty 200

## 2012-12-30 MED ORDER — LORAZEPAM 2 MG/ML IJ SOLN
2.0000 mg | Freq: Four times a day (QID) | INTRAMUSCULAR | Status: DC | PRN
  Filled 2012-12-30 (×3): qty 1

## 2012-12-30 MED ORDER — SODIUM CHLORIDE 0.9 % IV SOLN
500.0000 mg | Freq: Three times a day (TID) | INTRAVENOUS | Status: DC
  Filled 2012-12-30 (×2): qty 500

## 2012-12-30 MED ORDER — KCL IN DEXTROSE-NACL 20-5-0.45 MEQ/L-%-% IV SOLN
INTRAVENOUS | Status: DC
  Administered 2012-12-31 – 2013-01-01 (×2): via INTRAVENOUS
  Administered 2013-01-02 – 2013-01-03 (×3): 1000 mL via INTRAVENOUS
  Filled 2012-12-30 (×11): qty 1000

## 2012-12-30 MED ORDER — PROMETHAZINE HCL 25 MG RE SUPP
12.5000 mg | Freq: Four times a day (QID) | RECTAL | Status: DC | PRN
  Administered 2012-12-30 – 2013-01-01 (×3): 12.5 mg via RECTAL
  Filled 2012-12-30 (×2): qty 1

## 2012-12-30 MED ORDER — HYDROCODONE-ACETAMINOPHEN 5-325 MG PO TABS
1.0000 | ORAL_TABLET | Freq: Four times a day (QID) | ORAL | Status: DC | PRN
  Administered 2012-12-30 – 2012-12-31 (×2): 1 via ORAL
  Filled 2012-12-30 (×3): qty 1

## 2012-12-30 MED ORDER — POTASSIUM CHLORIDE 10 MEQ/100ML IV SOLN
10.0000 meq | INTRAVENOUS | Status: DC
Start: 2012-12-30 — End: 2012-12-30
  Filled 2012-12-30 (×2): qty 100

## 2012-12-30 MED ORDER — HYDRALAZINE HCL 20 MG/ML IJ SOLN
5.0000 mg | Freq: Four times a day (QID) | INTRAMUSCULAR | Status: DC | PRN
  Administered 2012-12-30 – 2012-12-31 (×4): 5 mg via INTRAVENOUS
  Filled 2012-12-30 (×4): qty 1

## 2012-12-30 MED ORDER — PROMETHAZINE HCL 25 MG/ML IJ SOLN
12.5000 mg | Freq: Four times a day (QID) | INTRAMUSCULAR | Status: DC | PRN
  Administered 2012-12-31: 12.5 mg via INTRAMUSCULAR
  Filled 2012-12-30 (×2): qty 1

## 2012-12-30 MED ORDER — POTASSIUM CHLORIDE 10 MEQ/100ML IV SOLN
10.0000 meq | INTRAVENOUS | Status: AC
  Administered 2012-12-30 (×2): 10 meq via INTRAVENOUS
  Filled 2012-12-30 (×4): qty 100

## 2012-12-30 MED ORDER — SENNA 8.6 MG PO TABS
1.0000 | ORAL_TABLET | Freq: Every day | ORAL | Status: DC
  Administered 2012-12-31 – 2013-01-02 (×3): 8.6 mg via ORAL
  Filled 2012-12-30 (×3): qty 1

## 2012-12-30 MED ORDER — ONDANSETRON HCL 4 MG PO TABS: 4.0000 mg | ORAL_TABLET | ORAL | Status: DC | PRN

## 2012-12-30 MED ORDER — METOPROLOL TARTRATE 1 MG/ML IV SOLN
2.5000 mg | Freq: Four times a day (QID) | INTRAVENOUS | Status: DC | PRN
  Filled 2012-12-30 (×2): qty 5

## 2012-12-30 MED ORDER — PROMETHAZINE HCL 25 MG PO TABS: 12.5000 mg | ORAL_TABLET | Freq: Four times a day (QID) | ORAL | Status: DC | PRN

## 2012-12-30 MED ORDER — POTASSIUM CHLORIDE CRYS ER 20 MEQ PO TBCR
40.0000 meq | EXTENDED_RELEASE_TABLET | Freq: Two times a day (BID) | ORAL | Status: DC
  Administered 2012-12-31 (×2): 40 meq via ORAL
  Filled 2012-12-30 (×3): qty 2

## 2012-12-30 MED ORDER — HYDRALAZINE HCL 20 MG/ML IJ SOLN
10.0000 mg | Freq: Once | INTRAMUSCULAR | Status: AC
  Administered 2012-12-30: 22:00:00 via INTRAMUSCULAR
  Filled 2012-12-30: qty 1

## 2012-12-30 MED ORDER — CIPROFLOXACIN HCL 500 MG PO TABS
500.0000 mg | ORAL_TABLET | Freq: Two times a day (BID) | ORAL | Status: DC
  Administered 2012-12-31 (×2): 500 mg via ORAL
  Filled 2012-12-30 (×4): qty 1

## 2012-12-30 MED ORDER — LORAZEPAM 2 MG/ML IJ SOLN
INTRAMUSCULAR | Status: AC
  Filled 2012-12-30: qty 1

## 2012-12-30 MED ORDER — PROMETHAZINE HCL 25 MG RE SUPP
25.0000 mg | Freq: Four times a day (QID) | RECTAL | Status: DC | PRN
  Administered 2012-12-30: 25 mg via RECTAL
  Filled 2012-12-30: qty 1

## 2012-12-30 MED ORDER — LORAZEPAM 2 MG/ML IJ SOLN
2.0000 mg | Freq: Four times a day (QID) | INTRAMUSCULAR | Status: DC | PRN
  Administered 2012-12-30: 2 mg via INTRAMUSCULAR

## 2012-12-30 MED ORDER — SODIUM CHLORIDE 0.9 % IV SOLN
500.0000 mg | Freq: Three times a day (TID) | INTRAVENOUS | Status: DC
  Filled 2012-12-30: qty 500

## 2012-12-30 MED ORDER — LORAZEPAM 2 MG/ML IJ SOLN
2.0000 mg | Freq: Four times a day (QID) | INTRAMUSCULAR | Status: DC | PRN
  Administered 2012-12-31 – 2013-01-03 (×7): 2 mg via INTRAVENOUS
  Filled 2012-12-30 (×5): qty 1

## 2012-12-30 MED ORDER — CIPROFLOXACIN HCL 500 MG PO TABS
500.0000 mg | ORAL_TABLET | Freq: Two times a day (BID) | ORAL | Status: DC
  Filled 2012-12-30 (×2): qty 1

## 2012-12-30 MED ORDER — METOPROLOL TARTRATE 12.5 MG HALF TABLET
12.5000 mg | ORAL_TABLET | Freq: Two times a day (BID) | ORAL | Status: DC
  Filled 2012-12-30 (×2): qty 1

## 2012-12-30 MED ORDER — ONDANSETRON HCL 4 MG/2ML IJ SOLN
4.0000 mg | Freq: Four times a day (QID) | INTRAMUSCULAR | Status: DC | PRN
  Filled 2012-12-30: qty 2

## 2012-12-30 MED ORDER — ONDANSETRON HCL 4 MG/2ML IJ SOLN
4.0000 mg | Freq: Four times a day (QID) | INTRAMUSCULAR | Status: DC | PRN
Start: 2012-12-30 — End: 2012-12-30

## 2012-12-30 MED ORDER — POTASSIUM CHLORIDE 10 MEQ/100ML IV SOLN
10.0000 meq | INTRAVENOUS | Status: AC
  Administered 2012-12-30: 10 meq via INTRAVENOUS
  Filled 2012-12-30 (×4): qty 100

## 2012-12-30 MED ORDER — METRONIDAZOLE 500 MG PO TABS
500.0000 mg | ORAL_TABLET | Freq: Two times a day (BID) | ORAL | Status: DC
  Filled 2012-12-30 (×2): qty 1

## 2012-12-30 MED ORDER — POTASSIUM CHLORIDE CRYS ER 20 MEQ PO TBCR
40.0000 meq | EXTENDED_RELEASE_TABLET | Freq: Two times a day (BID) | ORAL | Status: DC
  Filled 2012-12-30 (×2): qty 2

## 2012-12-30 MED FILL — Hydromorphone HCl Inj 1 MG/ML: INTRAMUSCULAR | Qty: 1 | Status: AC

## 2012-12-30 NOTE — Progress Notes (Signed)
CRITICAL VALUE ALERT  Critical value received:  K+ 2.6  Date of notification:  [id number]  Time of notification:  0630  Critical value read back:Y  Nurse who received alert:  vmj RN  MD notified (1st page):  Claiborne Billings  Time of first page:  0610  MD notified (2nd page):  Time of second page:  Responding MD:  Claiborne Billings  Time MD responded: [phone]

## 2012-12-30 NOTE — Progress Notes (Addendum)
ANTIBIOTIC CONSULT NOTE - FOLLOW UP  Pharmacy Consult for:  Vancomycin, Primaxin Indication:  Broad-spectrum therapy for fever of unknown origin, suspect intraabdominal etiology  Allergies  Allergen Reactions  . Haldol (Haloperidol Decanoate) Other (See Comments)    tongue sweeling  . Morphine And Related Hives    hives  . Penicillins Rash    Patient Measurements: Height: 5' 4.17" (163 cm) Weight: 153 lb (69.4 kg) IBW/kg (Calculated) : 55.1  Vital Signs: Temp: 100 F (37.8 C) (02/10 1729) Temp src: Oral (02/10 1729) BP: 195/95 mmHg (02/10 1729) Pulse Rate: 137 (02/10 1729) Intake/Output from previous day: 02/09 0701 - 02/10 0700 In: -  Out: 900 [Urine:900]  Labs:  Recent Labs  12/29/12 0949 12/29/12 1418 12/30/12 0510  WBC 9.1 10.5 12.3*  HGB 11.7* 11.9* 12.8  PLT 358 343 352  CREATININE 0.58  --  0.47*   Estimated Creatinine Clearance: 68.2 ml/min (by C-G formula based on Cr of 0.47).    Microbiology: Recent Results (from the past 720 hour(s))  CULTURE, BLOOD (ROUTINE X 2)     Status: None   Collection Time    12/29/12  4:50 PM      Result Value Range Status   Specimen Description BLOOD LEFT HAND   Final   Special Requests BOTTLES DRAWN AEROBIC AND ANAEROBIC 3CC EACH   Final   Culture  Setup Time 12/29/2012 23:15   Final   Culture     Final   Value:        BLOOD CULTURE RECEIVED NO GROWTH TO DATE CULTURE WILL BE HELD FOR 5 DAYS BEFORE ISSUING A FINAL NEGATIVE REPORT   Report Status PENDING   Incomplete  CULTURE, BLOOD (ROUTINE X 2)     Status: None   Collection Time    12/29/12  4:56 PM      Result Value Range Status   Specimen Description BLOOD LEFT HAND   Final   Special Requests BOTTLES DRAWN AEROBIC AND ANAEROBIC 4CC EACH   Final   Culture  Setup Time 12/29/2012 23:15   Final   Culture     Final   Value:        BLOOD CULTURE RECEIVED NO GROWTH TO DATE CULTURE WILL BE HELD FOR 5 DAYS BEFORE ISSUING A FINAL NEGATIVE REPORT   Report Status PENDING    Incomplete    Anti-infectives   Start     Dose/Rate Route Frequency Ordered Stop   12/30/12 2000  ciprofloxacin (CIPRO) tablet 500 mg  Status:  Discontinued     500 mg Oral 2 times daily 12/30/12 1423 12/30/12 1715   12/30/12 1800  vancomycin (VANCOCIN) IVPB 1000 mg/200 mL premix  Status:  Discontinued     1,000 mg 200 mL/hr over 60 Minutes Intravenous Every 12 hours 12/30/12 1330 12/30/12 1423   12/30/12 1500  imipenem-cilastatin (PRIMAXIN) 500 mg in sodium chloride 0.9 % 100 mL IVPB  Status:  Discontinued     500 mg 200 mL/hr over 30 Minutes Intravenous 3 times per day 12/30/12 1118 12/30/12 1422   12/30/12 1500  metroNIDAZOLE (FLAGYL) tablet 500 mg  Status:  Discontinued     500 mg Oral Every 12 hours 12/30/12 1423 12/30/12 1715   12/29/12 2200  oseltamivir (TAMIFLU) capsule 75 mg  Status:  Discontinued     75 mg Oral 2 times daily 12/29/12 1947 12/30/12 1215   12/29/12 2200  imipenem-cilastatin (PRIMAXIN) 500 mg in sodium chloride 0.9 % 100 mL IVPB  Status:  Discontinued  500 mg 200 mL/hr over 30 Minutes Intravenous 3 times per day 12/29/12 1947 12/30/12 1118   12/29/12 2030  vancomycin (VANCOCIN) IVPB 1000 mg/200 mL premix  Status:  Discontinued     1,000 mg 200 mL/hr over 60 Minutes Intravenous 2 times daily 12/29/12 2027 12/30/12 1330      Assessment:  Asked to assist with Vancomycin and Primaxin as broad-spectrum therapy for this 62 year-old female with fever of unknown origin.  These antibiotics were originally ordered on 12/29/12, and two doses of each had been administered when the IV access was lost today.  The patient's nurse states that very little of the morning dose of Vancomycin was infused.  When the IV was restarted, the nurse received an order to infuse KCl runs x 4 before starting the antibiotics -- to prevent losing the site again.  Goals of Therapy:   Vancomycin trough levels 15-20 mcg/ml  Eradication of infection  Plan:  1.  Resume antibiotics at  previously ordered doses:  Vancomycin 1000 mg IV every 12 hours  Primaxin 500 mg IV every 8 hours 2.  Measure the Vancomycin trough at steady state. 3.  Follow renal function and culture results.  Polo Riley R.Ph. 12/30/2012 6:13 PM
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
## 2012-12-30 NOTE — Progress Notes (Signed)
Attempted to meet with Pt.  Unable to assess, at this time, as Pt nauseous and in considerable pain.  CSW to continue to follow.  Providence Crosby, LCSWA Clinical Social Work [phone]

## 2012-12-30 NOTE — Progress Notes (Signed)
[facility] PROGRESS NOTE  [name] [name] ZOX:[id number] DOB: 05-12-1951 DOA: 12/29/2012 PCP: [name], MD  Assessment/Plan: Fever  -Etiology is unclear  -blood cultures ordered x2 sets and negative -Pt started on empiric abx's initially but patient has lost IV access and IV team has not been able to place IV line.  Will therefore change antibiotics to oral regimen and monitor. -No diarrhea or bowel movements as such C diff has not been able to be collected. Of note patient recently had antibiotics reportedly. -Check C. difficile PCR  -Influenza PCR negative tamiflu discontinued - HIV test non reactive -Chest x-ray is negative  - will recheck urinalysis and obtain urine culture.  Abdominal pain  -Unclear etiology.   -C. difficile pcr unable to obtain due to lack of sampling.  -Urine pregnancy test negative -obtain urinalysis and urine culture - Will add senna and assess if discomfort is improved with bowel movement.   Intractable vomiting and diarrhea  -Lipase is negative  -Unclear if patient has used any other illegal substances not detected by routine drug screen  -Anti-emetics prn and since IV access lost was changed to phenergan PR but will place order for po phenergan if patient able to tolerate. -No recent travels to suggest exotic infection  - Have nursing encourage po intake.  Suicidal ideation/history of bipolar disorder/anxiety -Psychiatry has been consulted - will defer further treatment recommendations to psychiatry - continue with sitter.  Tachycardia - patient very anxious in room and most likely related to abdominal discomfort and anxiety.  - pain mild with deep palpation and abdomen soft. - given tachycardia and elevated BP's will plan on placing on metoprolol  - continue to monitor.  Hypokalemia - Will replace orally. - Had 2 runs of IV potassium replacement today - recheck potassium next am.   Code Status: full Family Communication: No  family at bedside. Disposition Plan: Pending improvement in clinical condition.   Consultants:  none  Procedures:  none  Antibiotics:  Cipro  Metronidazole  HPI/Subjective: Patient states that she was suicidal.  At this point feels better after phenergan was administered PR.  IV team mentioned that patient was a hard stick and despite trying to place IV line 5 times they were unable to.  [name] anxious and will not hold still for procedure and therefore picc team does not recommend picc line placement at this time as patient will need to be still for procedure.  Nursing reports that patient has had lots of anxiety.  Objective: Filed Vitals:   12/30/12 0410 12/30/12 0641 12/30/12 0723 12/30/12 1209  BP: 157/86 176/88  168/97  Pulse: 114 119  131  Temp:  99.1 F (37.3 C)  97.6 F (36.4 C)  TempSrc:  Oral  Axillary  Resp:  16  18  Height:   5' 4.17" (1.63 m)   Weight:   69.4 kg (153 lb)   SpO2: 97% 97%  94%    Intake/Output Summary (Last 24 hours) at 12/30/12 1507 Last data filed at 12/30/12 0212  Gross per 24 hour  Intake      0 ml  Output    900 ml  Net   -900 ml   Filed Weights   12/30/12 0723  Weight: 69.4 kg (153 lb)    Exam:   General:  Pt in NAD, Alert and Awake  Cardiovascular: increased rate normal s1 and s2, No MrG  Respiratory: CTA BL, no wheezes  Abdomen: soft, mild tenderness at LLQ with deep palpation, hypoactive  bowel sounds.  Data Reviewed: Basic Metabolic Panel:  Recent Labs Lab 12/26/12 1437 12/29/12 0949 12/30/12 0510  NA 141 143 138  K 3.6 3.4* 2.6*  CL 105 107 98  CO2 25 24 26   GLUCOSE 97 116* 132*  BUN 10 20 10   CREATININE 0.51 0.58 0.47*  CALCIUM 9.8 9.0 9.4  MG  --   --  1.8   Liver Function Tests:  Recent Labs Lab 12/26/12 1437 12/29/12 1454 12/30/12 0510  AST 24 38* 42*  ALT 16 28 32  ALKPHOS 77 70 73  BILITOT 0.2* 0.7 1.1  PROT 7.3 7.1 7.4  ALBUMIN 3.9 3.9 4.2    Recent Labs Lab 12/26/12 1437  12/29/12 1454  LIPASE 12 9*   No results found for this basename: AMMONIA,  in the last 168 hours CBC:  Recent Labs Lab 12/26/12 1437 12/29/12 0949 12/29/12 1418 12/30/12 0510  WBC 6.8 9.1 10.5 12.3*  NEUTROABS 3.0  --  6.2  --   HGB 12.2 11.7* 11.9* 12.8  HCT 37.0 36.7 36.5 38.6  MCV 89.6 90.2 89.2 87.3  PLT 406* 358 343 352   Cardiac Enzymes: No results found for this basename: CKTOTAL, CKMB, CKMBINDEX, TROPONINI,  in the last 168 hours BNP (last 3 results) No results found for this basename: PROBNP,  in the last 8760 hours CBG: No results found for this basename: GLUCAP,  in the last 168 hours  Recent Results (from the past 240 hour(s))  CULTURE, BLOOD (ROUTINE X 2)     Status: None   Collection Time    12/29/12  4:50 PM      Result Value Range Status   Specimen Description BLOOD LEFT HAND   Final   Special Requests BOTTLES DRAWN AEROBIC AND ANAEROBIC 3CC EACH   Final   Culture  Setup Time 12/29/2012 23:15   Final   Culture     Final   Value:        BLOOD CULTURE RECEIVED NO GROWTH TO DATE CULTURE WILL BE HELD FOR 5 DAYS BEFORE ISSUING A FINAL NEGATIVE REPORT   Report Status PENDING   Incomplete  CULTURE, BLOOD (ROUTINE X 2)     Status: None   Collection Time    12/29/12  4:56 PM      Result Value Range Status   Specimen Description BLOOD LEFT HAND   Final   Special Requests BOTTLES DRAWN AEROBIC AND ANAEROBIC 4CC EACH   Final   Culture  Setup Time 12/29/2012 23:15   Final   Culture     Final   Value:        BLOOD CULTURE RECEIVED NO GROWTH TO DATE CULTURE WILL BE HELD FOR 5 DAYS BEFORE ISSUING A FINAL NEGATIVE REPORT   Report Status PENDING   Incomplete     Studies: Dg Abd Acute W/chest  12/29/2012  *RADIOLOGY REPORT*  Clinical Data: Pain, fever  ACUTE ABDOMEN SERIES (ABDOMEN 2 VIEW & CHEST 1 VIEW)  Comparison: Chest x-ray of 12/26/2012  Findings: Staples are noted medially overlying the right upper lung field.  No active infiltrate or effusion is seen.  Mild  cardiomegaly is stable.  Supine and erect views of the abdomen show some residual contrast throughout the nondistended colon.  No bowel obstruction is seen. No free air is noted.  IMPRESSION:  1.  Cardiomegaly.  Postoperative change on the right.  No active lung disease. 2.  No bowel obstruction. No free air.  Residual barium in the nondistended  colon.   Original Report Authenticated By: Dwyane Dee, M.D.     Scheduled Meds: . amLODipine  10 mg Oral Daily  . aspirin  81 mg Oral q morning - 10a  . ciprofloxacin  500 mg Oral BID  . enoxaparin (LOVENOX) injection  40 mg Subcutaneous Q24H  . gabapentin  400 mg Oral TID  . irbesartan  300 mg Oral Daily  . loratadine  10 mg Oral q morning - 10a  . LORazepam      . metoprolol tartrate  12.5 mg Oral BID  . metroNIDAZOLE  500 mg Oral Q12H  . mirtazapine  15 mg Oral QHS  . mirtazapine  15 mg Oral QHS  . pantoprazole  40 mg Oral Daily  . potassium chloride  40 mEq Oral BID  . risperiDONE  1 mg Oral BID  . senna  1 tablet Oral Daily   Continuous Infusions: . lactated ringers Stopped (12/29/12 1100)  . sodium chloride 0.9 % 1,000 mL with potassium chloride 20 mEq infusion 100 mL/hr at 12/29/12 2055    Active Problems:   SIRS (systemic inflammatory response syndrome)   Suicidal ideation   Bipolar 1 disorder   Fever   Persistent recurrent vomiting    Time spent: > 45 minutes    [name] [name]  [facility] Pager [phone] If 8PM-8AM, please contact night-coverage at [email or web address], password [facility] 12/30/2012, 3:07 PM  LOS: 1 day

## 2012-12-30 NOTE — Progress Notes (Signed)
Utilization review completed.  

## 2012-12-31 LAB — CBC
HCT: 40.8 % (ref 36.0–46.0)
MCHC: 33.3 g/dL (ref 30.0–36.0)
MCV: 88.9 fL (ref 78.0–100.0)
Platelets: 355 10*3/uL (ref 150–400)
RDW: 14.4 % (ref 11.5–15.5)

## 2012-12-31 LAB — BASIC METABOLIC PANEL
BUN: 23 mg/dL (ref 6–23)
Creatinine, Ser: 0.81 mg/dL (ref 0.50–1.10)
GFR calc Af Amer: 87 mL/min — ABNORMAL LOW (ref >90)
GFR calc non Af Amer: 75 mL/min — ABNORMAL LOW (ref >90)

## 2012-12-31 MED ORDER — SODIUM CHLORIDE 0.9 % IV SOLN
500.0000 mg | Freq: Three times a day (TID) | INTRAVENOUS | Status: DC
  Administered 2012-12-31 – 2013-01-02 (×6): 500 mg via INTRAVENOUS
  Filled 2012-12-31 (×7): qty 500

## 2012-12-31 MED ORDER — SODIUM CHLORIDE 0.9 % IV SOLN
750.0000 mg | Freq: Two times a day (BID) | INTRAVENOUS | Status: DC
  Administered 2012-12-31 – 2013-01-01 (×2): 750 mg via INTRAVENOUS
  Filled 2012-12-31 (×3): qty 750

## 2012-12-31 MED ORDER — SODIUM CHLORIDE 0.9 % IJ SOLN
10.0000 mL | INTRAMUSCULAR | Status: DC | PRN
  Administered 2013-01-01: 20 mL

## 2012-12-31 MED ORDER — METOPROLOL TARTRATE 12.5 MG HALF TABLET
12.5000 mg | ORAL_TABLET | Freq: Two times a day (BID) | ORAL | Status: DC
  Administered 2012-12-31 – 2013-01-03 (×6): 12.5 mg via ORAL
  Filled 2012-12-31 (×7): qty 1

## 2012-12-31 NOTE — Progress Notes (Signed)
Clinical Social Work Department CLINICAL SOCIAL WORK PSYCHIATRY SERVICE LINE ASSESSMENT 12/31/2012  Patient:  [name] [name]  Account:  [id number]  Admit Date:  12/29/2012  Clinical Social Worker:  Doroteo Glassman  Date/Time:  12/31/2012 12:52 PM Referred by:  Physician  Date referred:  12/31/2012 Reason for Referral  Behavioral Health Issues   Presenting Symptoms/Problems (In the person's/family's own words):   Pt voiced SI while in the ED, stating that she was thinking of jumping in front of a car.    Abuse/Neglect/Trauma Comments:   Psychiatric History (check all that apply)  Outpatient treatment   Psychiatric medications:  Remeron  Risperdal   Current Mental Health Hospitalizations/Previous Mental Health History:   Current provider:   Place and Date:   Current Medications:   see H&P   Previous Impatient Admission/Date/Reason:   denies   Emotional Health / Current Symptoms    Suicide/Self Harm  Suicidal ideation (ex: "I can't take any more,I wish I could disappear")   Suicide attempt in the past:   denies   Other harmful behavior:   Psychotic/Dissociative Symptoms  None reported   Other Psychotic/Dissociative Symptoms:    Attention/Behavioral Symptoms  Within Normal Limits   Other Attention / Behavioral Symptoms:    Cognitive Impairment  Within Normal Limits   Other Cognitive Impairment:    Mood and Adjustment  Anxious    Stress, Anxiety, Trauma, Any Recent Loss/Stressor  Other - See comment   Anxiety (frequency):   daily   Phobia (specify):   Compulsive behavior (specify):   Obsessive behavior (specify):   Other:   Pt reported significant anxiety and depression surrounding her medical pxs,   Substance Abuse/Use  None   SBIRT completed (please refer for detailed history):  N  Self-reported substance use:   Urinary Drug Screen Completed:  Y Alcohol level:    Environmental/Housing/Living Arrangement  Stable housing   Who is in  the home:   Daughter and grandchildren, ages 64 and 3   Emergency contact:  Lum Babe, daughter   Financial  Medicaid   Patient's Strengths and Goals (patient's own words):   Clinical Social Worker's Interpretive Summary:   Met with Pt to discuss current admission.    Pt was visibly anxious, with writhing all over her hospital bed.  She reported that just speaking with CSW is making her anxious.  Pt spoke very little and appeared extremely uncomfortable.    Pt admitted to voicing SI while in the ED.  She explained that she made the statement that she wanted to jump in front of a car when she was feeling awful and she was frustrated that the doctors have been unable to find out what's wrong with her.    Pt stated that she was given information by the [facility], [place], on [place] and she reported that she has been to [facility] for medication mgmt.  Pt was unable to recall the medication that [facility] currently prescribes for her but stated that it starts with an "m."  It's likely that Pt is referring to her Remeron.  Pt stated that she has been on this med for less than a month, thus she's not sure how well it's working for her.  Pt stated that she has been on other medications, by hx, and that nothing worked save for gabapentin.    Pt reported that she lives harmoniously with her daughter and her 2 grandchildren, ages 64 and 80.  Pt stated that her daughter is concerned about her, as  Pt's medical pxs have begun to scare the 64-year-old.  Pt explained that she was writhing on the floor in pain prior to going to the ED and that this scared the child.  Pt continues to express frustration re: her medical pxs and the medical staff not knowing what's going on with her.    Pt stated that she feels that she could benefit from therapeutic services and voiced her willingness to attend therapy at [facility].  Pt, however, has no transportation and keeps her 57-year-old grandchild.  Thus, therapy appts outside of the home  aren't possible, even with Medicaid transportation assistance.  CSW and Pt discussed a referral to the community ACT team; Pt was agreeable to CSW making this referral.    Pt denied current SI, although she admitted to having SI earlier today when she was feeling much worse.  She stated her SI are negatively correlated with her physical health.    Pt denied HI, AVH, paranoia, delusions.    CSW to make a referral to [facility] for in-home mental health services.    CSW thanked Pt for her time.    No further psych CSW needs identified.    Psych MD to notify psych CSW if additional needs arise.   Disposition:  Recommend Psych CSW continuing to support while in hospital  Providence Crosby, [place] Clinical Social Work [phone]

## 2012-12-31 NOTE — Progress Notes (Signed)
Referral made to [facility].  Providence Crosby, LCSWA Clinical Social Work [phone]

## 2012-12-31 NOTE — Progress Notes (Signed)
Per Care Coordinator, Pt still nauseous and uncomfortable.  Pt unable to effectively participate in assessment.  Notified psych MD.  Psych MD to assess Pt when she's feeling better.  Notified MD.  Providence Crosby, LCSWA Clinical Social Work [phone]

## 2012-12-31 NOTE — Progress Notes (Signed)
[facility] PROGRESS NOTE  [name] [name] FAO:[id number] DOB: [birth year] DOA: 12/29/2012 PCP: [name], MD  Assessment/Plan: Fever  -Etiology is unclear  -blood cultures ordered x2 sets and negative -Pt started on empiric abx's initially but patient has lost IV access and IV team has not been able to place IV line.  Will therefore change antibiotics to oral regimen and monitor. -No diarrhea or bowel movements as such C diff has not been able to be collected. Of note patient recently had antibiotics reportedly. -Check C. difficile PCR  -Influenza PCR negative tamiflu discontinued - HIV test non reactive -Chest x-ray is negative  - will recheck urinalysis and obtain urine culture.  Abdominal pain  -Unclear etiology.   -C. difficile pcr unable to obtain due to lack of sampling.  -Urine pregnancy test negative -obtain urinalysis and urine culture - Will add senna and assess if discomfort is improved with bowel movement.   Intractable vomiting and diarrhea  -Lipase is negative  -Unclear if patient has used any other illegal substances not detected by routine drug screen  -Anti-emetics prn  -No recent travels to suggest exotic infection  - Have nursing encourage po intake.  Suicidal ideation/history of bipolar disorder/anxiety -Psychiatry has been consulted recommendations pending. - will defer further treatment recommendations to psychiatry - continue with sitter.  Tachycardia - patient very anxious in room and most likely related to abdominal discomfort and anxiety.  - abdomen soft. - given tachycardia and elevated BP's will plan on placing on metoprolol  - continue to monitor.  Hypokalemia - Will replaced orally with kdur 40 meq today. - reassess next am   Code Status: full Family Communication: No family at bedside. Disposition Plan: Pending improvement in clinical condition.   Consultants:  none  Procedures:  none  Antibiotics:  Vanc and  zosyn  HPI/Subjective: Patient was difficult stick and subsequently needed IV access.  Picc line placed today.  No acute issues overnight and patient reports much improvement in her abdominal discomfort.    Objective: Filed Vitals:   12/30/12 2329 12/31/12 0551 12/31/12 0905 12/31/12 1422  BP: 164/84 137/75 171/95 146/91  Pulse: 144 118 124 120  Temp:  99.6 F (37.6 C)  98.9 F (37.2 C)  TempSrc:  Oral  Oral  Resp:  18  18  Height:      Weight:      SpO2:  97%  100%    Intake/Output Summary (Last 24 hours) at 12/31/12 1706 Last data filed at 12/31/12 1422  Gross per 24 hour  Intake    300 ml  Output      0 ml  Net    300 ml   Filed Weights   12/30/12 0723  Weight: 69.4 kg (153 lb)    Exam:   General:  Pt in NAD, Alert and Awake  Cardiovascular: increased rate normal s1 and s2, No MrG  Respiratory: CTA BL, no wheezes  Abdomen: soft, mild tenderness at LLQ with deep palpation, hypoactive bowel sounds.  Data Reviewed: Basic Metabolic Panel:  Recent Labs Lab 12/26/12 1437 12/29/12 0949 12/30/12 0510 12/31/12 0455  NA 141 143 138 137  K 3.6 3.4* 2.6* 3.1*  CL 105 107 98 100  CO2 25 24 26 22   GLUCOSE 97 116* 132* 141*  BUN 10 20 10 23   CREATININE 0.51 0.58 0.47* 0.81  CALCIUM 9.8 9.0 9.4 9.7  MG  --   --  1.8  --    Liver Function Tests:  Recent Labs  Lab 12/26/12 1437 12/29/12 1454 12/30/12 0510  AST 24 38* 42*  ALT 16 28 32  ALKPHOS 77 70 73  BILITOT 0.2* 0.7 1.1  PROT 7.3 7.1 7.4  ALBUMIN 3.9 3.9 4.2    Recent Labs Lab 12/26/12 1437 12/29/12 1454  LIPASE 12 9*   No results found for this basename: AMMONIA,  in the last 168 hours CBC:  Recent Labs Lab 12/26/12 1437 12/29/12 0949 12/29/12 1418 12/30/12 0510 12/31/12 0455  WBC 6.8 9.1 10.5 12.3* 10.7*  NEUTROABS 3.0  --  6.2  --   --   HGB 12.2 11.7* 11.9* 12.8 13.6  HCT 37.0 36.7 36.5 38.6 40.8  MCV 89.6 90.2 89.2 87.3 88.9  PLT 406* 358 343 352 355   Cardiac Enzymes: No  results found for this basename: CKTOTAL, CKMB, CKMBINDEX, TROPONINI,  in the last 168 hours BNP (last 3 results) No results found for this basename: PROBNP,  in the last 8760 hours CBG: No results found for this basename: GLUCAP,  in the last 168 hours  Recent Results (from the past 240 hour(s))  CULTURE, BLOOD (ROUTINE X 2)     Status: None   Collection Time    12/29/12  4:50 PM      Result Value Range Status   Specimen Description BLOOD LEFT HAND   Final   Special Requests BOTTLES DRAWN AEROBIC AND ANAEROBIC 3CC EACH   Final   Culture  Setup Time 12/29/2012 23:15   Final   Culture     Final   Value:        BLOOD CULTURE RECEIVED NO GROWTH TO DATE CULTURE WILL BE HELD FOR 5 DAYS BEFORE ISSUING A FINAL NEGATIVE REPORT   Report Status PENDING   Incomplete  CULTURE, BLOOD (ROUTINE X 2)     Status: None   Collection Time    12/29/12  4:56 PM      Result Value Range Status   Specimen Description BLOOD LEFT HAND   Final   Special Requests BOTTLES DRAWN AEROBIC AND ANAEROBIC 4CC EACH   Final   Culture  Setup Time 12/29/2012 23:15   Final   Culture     Final   Value:        BLOOD CULTURE RECEIVED NO GROWTH TO DATE CULTURE WILL BE HELD FOR 5 DAYS BEFORE ISSUING A FINAL NEGATIVE REPORT   Report Status PENDING   Incomplete     Studies: Dg Abd Portable 1v  12/30/2012  *RADIOLOGY REPORT*  Clinical Data: Emesis and abdominal discomfort.  PORTABLE ABDOMEN - 1 VIEW  Comparison: Acute abdominal series 12/29/2012.  Findings: The bowel gas pattern is unremarkable.  There is no evidence for obstruction or free air.  There is some residual contrast material within the colon, decreased from prior study. The lung bases are clear.  IMPRESSION:  1.  No acute abnormality. 2.  Decreased contrast within the colon.   Original Report Authenticated By: Marin Roberts, M.D.     Scheduled Meds: . amLODipine  10 mg Oral Daily  . aspirin  81 mg Oral q morning - 10a  . enoxaparin (LOVENOX) injection  40 mg  Subcutaneous Q24H  . gabapentin  400 mg Oral TID  . imipenem-cilastatin  500 mg Intravenous Q8H  . irbesartan  300 mg Oral Daily  . loratadine  10 mg Oral q morning - 10a  . mirtazapine  15 mg Oral QHS  . pantoprazole  40 mg Oral Daily  . potassium chloride  40 mEq Oral BID  . risperiDONE  1 mg Oral BID  . senna  1 tablet Oral Daily  . vancomycin  750 mg Intravenous Q12H   Continuous Infusions: . dextrose 5 % and 0.45 % NaCl with KCl 20 mEq/L 100 mL/hr at 12/31/12 0925  . lactated ringers Stopped (12/29/12 1100)    Active Problems:   SIRS (systemic inflammatory response syndrome)   Suicidal ideation   Bipolar 1 disorder   Fever   Persistent recurrent vomiting   Hypokalemia   Tachycardia    Time spent: > 35 minutes    [name] [name]  [facility] Pager [phone] If 8PM-8AM, please contact night-coverage at [email or web address], password [facility] 12/31/2012, 5:06 PM  LOS: 2 days

## 2012-12-31 NOTE — Progress Notes (Signed)
Peripherally Inserted Central Catheter/Midline Placement  The IV Nurse has discussed with the patient and/or persons authorized to consent for the patient, the purpose of this procedure and the potential benefits and risks involved with this procedure.  The benefits include less needle sticks, lab draws from the catheter and patient may be discharged home with the catheter.  Risks include, but not limited to, infection, bleeding, blood clot (thrombus formation), and puncture of an artery; nerve damage and irregular heat beat.  Alternatives to this procedure were also discussed.  PICC/Midline Placement Documentation        Lisabeth Devoid 12/31/2012, 8:47 AM Connsent obtained by Stacie Glaze, RN, IV Team

## 2012-12-31 NOTE — Progress Notes (Signed)
ANTIBIOTIC CONSULT NOTE - FOLLOW UP  Pharmacy Consult for:  Vancomycin, Primaxin Indication:  Broad-spectrum therapy for fever of unknown origin, suspect intraabdominal etiology  Allergies  Allergen Reactions  . Haldol (Haloperidol Decanoate) Other (See Comments)    tongue sweeling  . Morphine And Related Hives    hives  . Penicillins Rash    Patient Measurements: Height: 5' 4.17" (163 cm) Weight: 153 lb (69.4 kg) IBW/kg (Calculated) : 55.1  Vital Signs: Temp: 98.9 F (37.2 C) (02/11 1422) Temp src: Oral (02/11 1422) BP: 146/91 mmHg (02/11 1422) Pulse Rate: 120 (02/11 1422) Intake/Output from previous day:    Labs:  Recent Labs  12/29/12 0949 12/29/12 1418 12/30/12 0510 12/31/12 0455  WBC 9.1 10.5 12.3* 10.7*  HGB 11.7* 11.9* 12.8 13.6  PLT 358 343 352 355  CREATININE 0.58  --  0.47* 0.81   Estimated Creatinine Clearance: 67 ml/min   Assessment: 1 yof with h/o lung cancer presented 2/9 with c/o abd pain, N/V/D, suicidal ideation. Patient noted to have a fever of 101.8 in the ER. MD started empiric antibiotics with Vanc and Primaxin 2/9 but this was discontinued on 2/10 given no IV access.  MD ordered for PO Cipro and Flagyl in the meantime.  Today, patient with PICC line in placed, MD would like to resume IV vancomycin and Primaxin.    Pt is afebrile, WBC 10.7, Scr 0.81 (CG 67, N 79). Blood and urine cultures pending  Goals of Therapy:   Vancomycin trough levels 15-20 mcg/ml  Eradication of infection  Plan:   Discontinue po Cipro and Flagyl per Dr. Cena Benton telephone order  Vancomycin 750 mg IV q12h  Primaxin 500 mg IV q8h  Follow culture and renal fxn and narrow abx as appropriate  Pharmacy will f/u   Geoffry Paradise, PharmD, BCPS Pager: [phone] 3:09 PM Pharmacy #: [id number]

## 2013-01-01 ENCOUNTER — Inpatient Hospital Stay (HOSPITAL_COMMUNITY): Payer: Medicaid Other

## 2013-01-01 DIAGNOSIS — R509 Fever, unspecified: Secondary | ICD-10-CM

## 2013-01-01 DIAGNOSIS — E876 Hypokalemia: Secondary | ICD-10-CM

## 2013-01-01 DIAGNOSIS — R000 Tachycardia, unspecified: Secondary | ICD-10-CM

## 2013-01-01 DIAGNOSIS — R1084 Generalized abdominal pain: Secondary | ICD-10-CM

## 2013-01-01 LAB — HEPATIC FUNCTION PANEL
ALT: 22 U/L (ref 0–35)
AST: 19 U/L (ref 0–37)
Albumin: 3.2 g/dL — ABNORMAL LOW (ref 3.5–5.2)
Alkaline Phosphatase: 55 U/L (ref 39–117)
Total Protein: 6.6 g/dL (ref 6.0–8.3)

## 2013-01-01 LAB — BASIC METABOLIC PANEL
CO2: 25 mEq/L (ref 19–32)
Chloride: 107 mEq/L (ref 96–112)
GFR calc Af Amer: >90 mL/min (ref >90)
Potassium: 3.5 mEq/L (ref 3.5–5.1)
Sodium: 139 mEq/L (ref 135–145)

## 2013-01-01 LAB — URINE CULTURE: Colony Count: NO GROWTH

## 2013-01-01 MED ORDER — VITAMINS A & D EX OINT
TOPICAL_OINTMENT | CUTANEOUS | Status: AC
  Administered 2013-01-01: 13:00:00
  Filled 2013-01-01: qty 5

## 2013-01-01 NOTE — Progress Notes (Signed)
I spoke with Infectious disease personnel re: patient on contact to r/o C-Diff,patient has no BM since 12/29/12, Clydie Braun said to d/c contact precaution,order carried out. - [name] RN

## 2013-01-01 NOTE — Progress Notes (Signed)
Provided Pt with area NA meetings.  No further psych CSW needs identified.  Psych CSW to sign off.  Providence Crosby, LCSWA Clinical Social Work [phone]

## 2013-01-01 NOTE — Progress Notes (Signed)
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 The patient has been complaining of nausea, vomiting, diarrhea, and abdominal pain that began last Thursday, 12/26/2012. The patient came to the emergency department on the day. The patient was sent home with an antiemetic and Lortab. The patient states that she did take the Lortab, but this did not help her pain. She continued to have vomiting, abdominal pain, and diarrhea. As a result, she came to the emergency department on 12/28/2012. The patient had a CT of the abdomen and pelvis on 01/23/2013 with IV contrast that showed a small right pleural effusion without any other acute findings. When she came back 12/28/2012, repeat CT of the abdomen and pelvis without contrast did not show any significant change. Of note, there was no obstruction, no mesenteric inflammation. The patient was about to be sent home in the morning of 12/29/2012 from the emergency department. During that time, the patient stated that she went to see a mental health professional. She had suicidal ideation. She stated that she went to run in front of a car on the highway. In addition, the patient has a history of heroin use and was previously exonerated from a murder charge for self defense. Apparently, she killed her husband. The patient states that she has had 5-6 loose stools prior to coming to the emergency department. She cannot say whether she has had any hematochezia or melena. She has not had any hematemesis or coffee grounds emesis. The patient complains of subjective fevers and chills. She denied any chest pain, shortness breath, palpitations, syncope, dysuria, hematuria. After the patient expressed suicidal  ideation, she was noted to have a fever of 101.1F in the emergency department. Urinalysis did not suggest UTI. Lactic acid was 1.1. The patient did complain of generalized myalgias and arthralgias without any headaches, sore throat, rashes, or synovitis. She denied any other intravenous drug use.  Past medical history:  Past Medical History  Diagnosis Date  . Hypertension   . Diabetes mellitus   . Bipolar 1 disorder   . Heart murmur   . Sciatica   . Arthritis   . Hepatitis     hep c ?, pt states thinks  . Anxiety   . Diverticulosis     Consultants: Psyche  Procedures: None  Antibiotics: IV Vanc, IV Primaxin  Subjective: Patient continues to have nausea and had a small emesis earlier today. No diarrhea. Some upper abdominal pain. Daughter very upset that no one has been communicating with them.  Objective: Vital Signs  Filed Vitals:   12/31/12 1816 12/31/12 2310 01/01/13 0510 01/01/13 0912  BP: 123/74 137/88 136/89 171/92  Pulse: 121 89 87 115  Temp:  98 F (36.7 C) 98.6 F (37 C)   TempSrc:  Oral Oral   Resp:  18 18   Height:      Weight:      SpO2:  98% 100%     Intake/Output Summary (Last 24 hours) at 01/01/13 1340 Last data filed at 01/01/13 1336  Gross per 24 hour  Intake   2080 ml  Output    650 ml  Net   1430 ml   Filed Weights   12/30/12 0723  Weight: 69.4 kg (153 lb)    General appearance: alert, cooperative, appears  stated age and no distress Head: Normocephalic, without obvious abnormality, atraumatic Resp: clear to auscultation bilaterally Cardio: regular rate and rhythm, S1, S2 normal, no murmur, click, rub or gallop GI: soft, tender in the RUQ. No rebound rigidity or guarding. BS present. No masses or organomegaly Extremities: extremities normal, atraumatic, no cyanosis or edema Pulses: 2+ and symmetric Skin: Skin color, texture, turgor normal. No rashes or lesions Neurologic: Alert and oriented x 3. No focal deficits.  Lab  Results:  Basic Metabolic Panel:  Recent Labs Lab 12/26/12 1437 12/29/12 0949 12/30/12 0510 12/31/12 0455 01/01/13 0605  NA 141 143 138 137 139  K 3.6 3.4* 2.6* 3.1* 3.5  CL 105 107 98 100 107  CO2 25 24 26 22 25   GLUCOSE 97 116* 132* 141* 110*  BUN 10 20 10 23 23   CREATININE 0.51 0.58 0.47* 0.81 0.54  CALCIUM 9.8 9.0 9.4 9.7 8.7  MG  --   --  1.8  --   --    Liver Function Tests:  Recent Labs Lab 12/26/12 1437 12/29/12 1454 12/30/12 0510  AST 24 38* 42*  ALT 16 28 32  ALKPHOS 77 70 73  BILITOT 0.2* 0.7 1.1  PROT 7.3 7.1 7.4  ALBUMIN 3.9 3.9 4.2    Recent Labs Lab 12/26/12 1437 12/29/12 1454  LIPASE 12 9*   No results found for this basename: AMMONIA,  in the last 168 hours CBC:  Recent Labs Lab 12/26/12 1437 12/29/12 0949 12/29/12 1418 12/30/12 0510 12/31/12 0455  WBC 6.8 9.1 10.5 12.3* 10.7*  NEUTROABS 3.0  --  6.2  --   --   HGB 12.2 11.7* 11.9* 12.8 13.6  HCT 37.0 36.7 36.5 38.6 40.8  MCV 89.6 90.2 89.2 87.3 88.9  PLT 406* 358 343 352 355    Recent Results (from the past 240 hour(s))  CULTURE, BLOOD (ROUTINE X 2)     Status: None   Collection Time    12/29/12  4:50 PM      Result Value Range Status   Specimen Description BLOOD LEFT HAND   Final   Special Requests BOTTLES DRAWN AEROBIC AND ANAEROBIC 3CC EACH   Final   Culture  Setup Time 12/29/2012 23:15   Final   Culture     Final   Value:        BLOOD CULTURE RECEIVED NO GROWTH TO DATE CULTURE WILL BE HELD FOR 5 DAYS BEFORE ISSUING A FINAL NEGATIVE REPORT   Report Status PENDING   Incomplete  CULTURE, BLOOD (ROUTINE X 2)     Status: None   Collection Time    12/29/12  4:56 PM      Result Value Range Status   Specimen Description BLOOD LEFT HAND   Final   Special Requests BOTTLES DRAWN AEROBIC AND ANAEROBIC 4CC EACH   Final   Culture  Setup Time 12/29/2012 23:15   Final   Culture     Final   Value:        BLOOD CULTURE RECEIVED NO GROWTH TO DATE CULTURE WILL BE HELD FOR 5 DAYS BEFORE  ISSUING A FINAL NEGATIVE REPORT   Report Status PENDING   Incomplete  URINE CULTURE     Status: None   Collection Time    12/30/12 10:03 PM      Result Value Range Status   Specimen Description URINE, CLEAN CATCH   Final   Special Requests NONE   Final   Culture  Setup Time 12/31/2012 04:03  Final   Colony Count NO GROWTH   Final   Culture NO GROWTH   Final   Report Status 01/01/2013 FINAL   Final      Studies/Results: Dg Abd Portable 1v  12/30/2012  *RADIOLOGY REPORT*  Clinical Data: Emesis and abdominal discomfort.  PORTABLE ABDOMEN - 1 VIEW  Comparison: Acute abdominal series 12/29/2012.  Findings: The bowel gas pattern is unremarkable.  There is no evidence for obstruction or free air.  There is some residual contrast material within the colon, decreased from prior study. The lung bases are clear.  IMPRESSION:  1.  No acute abnormality. 2.  Decreased contrast within the colon.   Original Report Authenticated By: Marin Roberts, M.D.     Medications:  Scheduled: . amLODipine  10 mg Oral Daily  . aspirin  81 mg Oral q morning - 10a  . enoxaparin (LOVENOX) injection  40 mg Subcutaneous Q24H  . gabapentin  400 mg Oral TID  . imipenem-cilastatin  500 mg Intravenous Q8H  . irbesartan  300 mg Oral Daily  . loratadine  10 mg Oral q morning - 10a  . metoprolol tartrate  12.5 mg Oral BID  . mirtazapine  15 mg Oral QHS  . pantoprazole  40 mg Oral Daily  . risperiDONE  1 mg Oral BID  . senna  1 tablet Oral Daily  . vancomycin  750 mg Intravenous Q12H   Continuous: . dextrose 5 % and 0.45 % NaCl with KCl 20 mEq/L 100 mL/hr at 12/31/12 0925  . lactated ringers Stopped (12/29/12 1100)   HQI:[id number], acetaminophen, hydrALAZINE, HYDROcodone-acetaminophen, HYDROmorphone (DILAUDID) injection, LORazepam, LORazepam, promethazine, promethazine, sodium chloride  Assessment/Plan:  Active Problems:   SIRS (systemic inflammatory response syndrome)   Suicidal ideation   Bipolar 1  disorder   Fever   Persistent recurrent vomiting   Hypokalemia   Tachycardia    Fever  Etiology is unclear. Cultures negative so far. Can stop Vanc. Influenza PCR negative tamiflu discontinued. UA not impressive for infection.  Abdominal pain  Unclear etiology. Multiple Ct have failed to reveal etiology. Check [place]. She could have gastroparesis as she has history of DM. Reports recent EGD by Dr. [name]. Only showed hiatal hernia. Has an appointment with him next week. Continue PPI.  Intractable vomiting and diarrhea  Repeat LFT. Diarrhea appears to have resolved.   Suicidal ideation/history of bipolar disorder/anxiety  Psychiatry has seen and has signed off. Patient apparently still suicidal at times. Continue with sitter for now.  Tachycardia  Patient very anxious in room and most likely related to abdominal discomfort and anxiety. On Metoprolol  Hypokalemia  Repleted.   DVT Prophylaxis Enoxaparin  Code Status: full  Family Communication: Discussed with patient and daughter Disposition Plan: Unclear for now.    LOS: 3 days   [facility]  [facility] Pager [phone] 01/01/2013, 1:40 PM  If 8PM-8AM, please contact night-coverage at [email or web address], password [facility]

## 2013-01-02 DIAGNOSIS — A0472 Enterocolitis due to Clostridium difficile, not specified as recurrent: Secondary | ICD-10-CM | POA: Diagnosis present

## 2013-01-02 LAB — COMPREHENSIVE METABOLIC PANEL
ALT: 17 U/L (ref 0–35)
Alkaline Phosphatase: 45 U/L (ref 39–117)
BUN: 10 mg/dL (ref 6–23)
CO2: 26 mEq/L (ref 19–32)
Calcium: 8.7 mg/dL (ref 8.4–10.5)
GFR calc Af Amer: >90 mL/min (ref >90)
GFR calc non Af Amer: >90 mL/min (ref >90)
Glucose, Bld: 114 mg/dL — ABNORMAL HIGH (ref 70–99)
Total Protein: 5.3 g/dL — ABNORMAL LOW (ref 6.0–8.3)

## 2013-01-02 LAB — CBC
HCT: 35.2 % — ABNORMAL LOW (ref 36.0–46.0)
Hemoglobin: 11.4 g/dL — ABNORMAL LOW (ref 12.0–15.0)
MCH: 29.2 pg (ref 26.0–34.0)
MCHC: 32.4 g/dL (ref 30.0–36.0)
RBC: 3.91 MIL/uL (ref 3.87–5.11)

## 2013-01-02 LAB — TSH: TSH: 2.492 u[IU]/mL (ref 0.350–4.500)

## 2013-01-02 MED ORDER — METOCLOPRAMIDE HCL 5 MG PO TABS
5.0000 mg | ORAL_TABLET | Freq: Three times a day (TID) | ORAL | Status: DC
  Administered 2013-01-02 – 2013-01-03 (×5): 5 mg via ORAL
  Filled 2013-01-02 (×8): qty 1

## 2013-01-02 MED ORDER — POTASSIUM CHLORIDE 10 MEQ/100ML IV SOLN
10.0000 meq | INTRAVENOUS | Status: AC
  Administered 2013-01-02 (×4): 10 meq via INTRAVENOUS
  Filled 2013-01-02 (×4): qty 100

## 2013-01-02 MED ORDER — VANCOMYCIN 50 MG/ML ORAL SOLUTION
125.0000 mg | Freq: Four times a day (QID) | ORAL | Status: DC
  Administered 2013-01-02 – 2013-01-03 (×5): 125 mg via ORAL
  Filled 2013-01-02 (×8): qty 2.5

## 2013-01-02 NOTE — Progress Notes (Addendum)
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 The patient has been complaining of nausea, vomiting, diarrhea, and abdominal pain that began last Thursday, 12/26/2012. The patient came to the emergency department on the day. The patient was sent home with an antiemetic and Lortab. The patient states that she did take the Lortab, but this did not help her pain. She continued to have vomiting, abdominal pain, and diarrhea. As a result, she came to the emergency department on 12/28/2012. The patient had a CT of the abdomen and pelvis on 01/23/2013 with IV contrast that showed a small right pleural effusion without any other acute findings. When she came back 12/28/2012, repeat CT of the abdomen and pelvis without contrast did not show any significant change. Of note, there was no obstruction, no mesenteric inflammation. The patient was about to be sent home in the morning of 12/29/2012 from the emergency department. During that time, the patient stated that she went to see a mental health professional. She had suicidal ideation. She stated that she went to run in front of a car on the highway. In addition, the patient has a history of heroin use and was previously exonerated from a murder charge for self defense. Apparently, she killed her husband. The patient states that she has had 5-6 loose stools prior to coming to the emergency department. She cannot say whether she has had any hematochezia or melena. She has not had any hematemesis or coffee grounds emesis. The patient complains of subjective fevers and chills. She denied any chest pain, shortness breath, palpitations, syncope, dysuria, hematuria. After the patient expressed suicidal  ideation, she was noted to have a fever of 101.2F in the emergency department. Urinalysis did not suggest UTI. Lactic acid was 1.1. The patient did complain of generalized myalgias and arthralgias without any headaches, sore throat, rashes, or synovitis. She denied any other intravenous drug use.  Past medical history:  Past Medical History  Diagnosis Date  . Hypertension   . Diabetes mellitus   . Bipolar 1 disorder   . Heart murmur   . Sciatica   . Arthritis   . Hepatitis     hep c ?, pt states thinks  . Anxiety   . Diverticulosis     Consultants: Psyche  Procedures: None  Antibiotics: IV Vanc stopped 2/12 IV Primaxin  Subjective: Patient continues to have some nausea but no vomiting. Loose stool this AM. Some upper abdominal pain. Not suicidal. Says she feesl that way when she has medical problems.  Objective: Vital Signs  Filed Vitals:   01/01/13 0912 01/01/13 1440 01/01/13 2206 01/02/13 0600  BP: 171/92 138/85 116/59 108/59  Pulse: 115 86 101 95  Temp:  98.4 F (36.9 C) 98 F (36.7 C) 98.4 F (36.9 C)  TempSrc:  Oral Oral Oral  Resp:  18 18 18   Height:      Weight:      SpO2:  98% 100% 100%    Intake/Output Summary (Last 24 hours) at 01/02/13 0928 Last data filed at 01/02/13 0600  Gross per 24 hour  Intake   2340 ml  Output   2100 ml  Net    240 ml   Filed Weights   12/30/12 0723  Weight: 69.4 kg (153 lb)  General appearance: alert, cooperative, appears stated age and no distress Resp: clear to auscultation bilaterally Cardio: regular rate and rhythm, S1, S2 normal, no murmur, click, rub or gallop GI: soft, non tender today. BS present. No masses or organomegaly Extremities: extremities normal, atraumatic, no cyanosis or edema Neurologic: Alert and oriented x 3. No focal deficits.  Lab Results:  Basic Metabolic Panel:  Recent Labs Lab 12/29/12 0949 12/30/12 0510 12/31/12 0455 01/01/13 0605 01/02/13 0555  NA 143 138 137 139 136  K  3.4* 2.6* 3.1* 3.5 3.3*  CL 107 98 100 107 103  CO2 24 26 22 25 26   GLUCOSE 116* 132* 141* 110* 114*  BUN 20 10 23 23 10   CREATININE 0.58 0.47* 0.81 0.54 0.52  CALCIUM 9.0 9.4 9.7 8.7 8.7  MG  --  1.8  --   --   --    Liver Function Tests:  Recent Labs Lab 12/26/12 1437 12/29/12 1454 12/30/12 0510 01/01/13 1400 01/02/13 0555  AST 24 38* 42* 19 15  ALT 16 28 32 22 17  ALKPHOS 77 70 73 55 45  BILITOT 0.2* 0.7 1.1 0.7 0.6  PROT 7.3 7.1 7.4 6.6 5.3*  ALBUMIN 3.9 3.9 4.2 3.2* 2.7*    Recent Labs Lab 12/26/12 1437 12/29/12 1454 01/01/13 1400  LIPASE 12 9* 13   CBC:  Recent Labs Lab 12/26/12 1437 12/29/12 0949 12/29/12 1418 12/30/12 0510 12/31/12 0455 01/02/13 0555  WBC 6.8 9.1 10.5 12.3* 10.7* 5.1  NEUTROABS 3.0  --  6.2  --   --   --   HGB 12.2 11.7* 11.9* 12.8 13.6 11.4*  HCT 37.0 36.7 36.5 38.6 40.8 35.2*  MCV 89.6 90.2 89.2 87.3 88.9 90.0  PLT 406* 358 343 352 355 275    Recent Results (from the past 240 hour(s))  CULTURE, BLOOD (ROUTINE X 2)     Status: None   Collection Time    12/29/12  4:50 PM      Result Value Range Status   Specimen Description BLOOD LEFT HAND   Final   Special Requests BOTTLES DRAWN AEROBIC AND ANAEROBIC 3CC EACH   Final   Culture  Setup Time 12/29/2012 23:15   Final   Culture     Final   Value:        BLOOD CULTURE RECEIVED NO GROWTH TO DATE CULTURE WILL BE HELD FOR 5 DAYS BEFORE ISSUING A FINAL NEGATIVE REPORT   Report Status PENDING   Incomplete  CULTURE, BLOOD (ROUTINE X 2)     Status: None   Collection Time    12/29/12  4:56 PM      Result Value Range Status   Specimen Description BLOOD LEFT HAND   Final   Special Requests BOTTLES DRAWN AEROBIC AND ANAEROBIC 4CC EACH   Final   Culture  Setup Time 12/29/2012 23:15   Final   Culture     Final   Value:        BLOOD CULTURE RECEIVED NO GROWTH TO DATE CULTURE WILL BE HELD FOR 5 DAYS BEFORE ISSUING A FINAL NEGATIVE REPORT   Report Status PENDING   Incomplete  URINE CULTURE      Status: None   Collection Time    12/30/12 10:03 PM      Result Value Range Status   Specimen Description URINE, CLEAN CATCH   Final   Special Requests NONE   Final   Culture  Setup Time 12/31/2012 04:03   Final   Colony  Count NO GROWTH   Final   Culture NO GROWTH   Final   Report Status 01/01/2013 FINAL   Final      Studies/Results: US Abdomen Complete  01/01/2013  *RADIOLOGY REPORT*  Clinical Data:  Nausea, right upper quadrant pain, history hypertension, diabetes  ULTRASOUND ABDOMEN:  Technique:  Sonography of upper abdominal structures was performed.  Comparison:  08/08/2012  Gallbladder:  Normally distended without stones or wall thickening. No pericholecystic fluid or sonographic Murphy sign.  Common bile duct:  Upper normal caliber 6 mm diameter  Liver:  Normal appearance  IVC:  Normal appearance  Pancreas:  Incomplete visualization of distal pancreatic tail due to obscuration by bowel gas.  Visualized portions of pancreas normal appearance.  Spleen:  Normal appearance, 4.5 cm length  Right kidney:  11.2 cm length. Normal morphology without mass or hydronephrosis.  Left kidney:  11.2 cm length. Normal morphology without mass or hydronephrosis.  Aorta:  Distal portion obscured by bowel gas.  Proximally normal caliber.  Other: No free fluid  IMPRESSION: Incomplete visualization of distal pancreatic tail and distal abdominal aorta. Otherwise negative exam.   Original Report Authenticated By: Ulyses Southward, M.D.     Medications:  Scheduled: . amLODipine  10 mg Oral Daily  . aspirin  81 mg Oral q morning - 10a  . enoxaparin (LOVENOX) injection  40 mg Subcutaneous Q24H  . gabapentin  400 mg Oral TID  . imipenem-cilastatin  500 mg Intravenous Q8H  . irbesartan  300 mg Oral Daily  . loratadine  10 mg Oral q morning - 10a  . metoprolol tartrate  12.5 mg Oral BID  . mirtazapine  15 mg Oral QHS  . pantoprazole  40 mg Oral Daily  . risperiDONE  1 mg Oral BID  . senna  1 tablet Oral Daily    Continuous: . dextrose 5 % and 0.45 % NaCl with KCl 20 mEq/L 1,000 mL (01/02/13 0325)  . lactated ringers Stopped (12/29/12 1100)   ZOX:[id number], acetaminophen, hydrALAZINE, HYDROcodone-acetaminophen, HYDROmorphone (DILAUDID) injection, LORazepam, LORazepam, promethazine, promethazine, sodium chloride  Assessment/Plan:  Active Problems:   SIRS (systemic inflammatory response syndrome)   Suicidal ideation   Bipolar 1 disorder   Fever   Persistent recurrent vomiting   Hypokalemia   Tachycardia    Fever  Appears to have resolved. Etiology is unclear. Cultures negative so far. Vanc stopped 2/12. Urine culture showed no growth. Will stop Imipenem today. Influenza PCR negative tamiflu discontinued.  Abdominal pain  Unclear etiology. Multiple Ct's have failed to reveal etiology. [place] was unremarkable as well. She could have gastroparesis as she has history of DM. Reports recent EGD by Dr. [name]. Only showed hiatal hernia. Has an appointment with him next week. Continue PPI. Await GES.  Intractable vomiting and diarrhea  Improved. No vomiting. Repeat LFT normal. Diarrhea appears to have resolved.   Suicidal ideation/history of bipolar disorder/anxiety  Psychiatry has seen and has signed off. Her suicidal thoughts appear to be situational. Continue with sitter for now. OP psyche follow up.  Tachycardia  Patient very anxious in room and most likely related to abdominal discomfort and anxiety. On Metoprolol. Check TSH.  Hypokalemia  Replete. Check Mg.  DVT Prophylaxis Enoxaparin  Code Status: full  Family Communication: Discussed with patient Disposition Plan: Anticipate discharge 2/14    LOS: 4 days   [facility]  [facility] Pager [phone] 01/02/2013, 9:28 AM  If 8PM-8AM, please contact night-coverage at [email or web address], password [email or web address]   C Diff PCR was positive.  Due to significant nausea will treat with oral vancomycin instead of  Flagyl.  Mirela Parsley 10:50 AM

## 2013-01-03 DIAGNOSIS — A0472 Enterocolitis due to Clostridium difficile, not specified as recurrent: Principal | ICD-10-CM

## 2013-01-03 LAB — BASIC METABOLIC PANEL
BUN: 9 mg/dL (ref 6–23)
Calcium: 8.6 mg/dL (ref 8.4–10.5)
GFR calc Af Amer: >90 mL/min (ref >90)
GFR calc non Af Amer: >90 mL/min (ref >90)
Glucose, Bld: 112 mg/dL — ABNORMAL HIGH (ref 70–99)
Potassium: 3.6 mEq/L (ref 3.5–5.1)

## 2013-01-03 MED ORDER — SACCHAROMYCES BOULARDII 250 MG PO CAPS
250.0000 mg | ORAL_CAPSULE | Freq: Two times a day (BID) | ORAL | Status: DC
  Administered 2013-01-03: 250 mg via ORAL
  Filled 2013-01-03 (×2): qty 1

## 2013-01-03 MED ORDER — VITAMINS A & D EX OINT
TOPICAL_OINTMENT | CUTANEOUS | Status: AC
  Administered 2013-01-03: 5
  Filled 2013-01-03: qty 5

## 2013-01-03 MED ORDER — SACCHAROMYCES BOULARDII 250 MG PO CAPS: 250.0000 mg | ORAL_CAPSULE | Freq: Two times a day (BID) | ORAL | Status: DC

## 2013-01-03 MED ORDER — VANCOMYCIN 50 MG/ML ORAL SOLUTION: 125.0000 mg | Freq: Four times a day (QID) | ORAL | Status: DC

## 2013-01-03 MED ORDER — METOCLOPRAMIDE HCL 5 MG PO TABS: 5.0000 mg | ORAL_TABLET | Freq: Three times a day (TID) | ORAL | Status: DC

## 2013-01-03 MED ORDER — METOPROLOL TARTRATE 12.5 MG HALF TABLET: 12.5000 mg | ORAL_TABLET | Freq: Two times a day (BID) | ORAL | Status: DC

## 2013-01-03 MED ORDER — RISPERIDONE 1 MG PO TABS: 1.0000 mg | ORAL_TABLET | Freq: Two times a day (BID) | ORAL | Status: DC

## 2013-01-03 NOTE — Discharge Summary (Signed)
[facility]  Physician Discharge Summary   Patient ID: [name] [name] MRN: [id number] DOB/AGE: Mar 16, 1951 62 y.o.  Admit date: 12/29/2012 Discharge date: 01/03/2013  PCP: [name], MD  DISCHARGE DIAGNOSES:  Active Problems:   Bipolar 1 disorder   Tachycardia   Enteritis due to Clostridium difficile   RECOMMENDATIONS FOR OUTPATIENT FOLLOW UP: 1. Needs follow up next week for c diff 2. She couldn't undergo a gastric emptying study to rule out gastroparesis. This is to be considered as OP when she is better. Her symptoms did improve with Reglan.  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 The patient has been complaining of nausea, vomiting, diarrhea, and abdominal pain that began last Thursday, 12/26/2012. The patient came to the emergency department on the day. The patient was sent home with an antiemetic and Lortab. The patient states that she did take the Lortab, but this did not help her pain. She continued to have vomiting, abdominal pain, and diarrhea. As a result, she came to the emergency department on 12/28/2012. The patient had a CT of the abdomen and pelvis on 01/23/2013 with IV contrast that showed a small right pleural effusion without any other acute findings. When she came back 12/28/2012, repeat CT of the abdomen and pelvis without contrast did not show any significant change. Of note, there was no obstruction, no mesenteric inflammation. The patient was about to be sent home in the morning of 12/29/2012 from the emergency department. During that time, the patient stated that she went to see a mental health professional. She had suicidal ideation. She stated that she went to run in  front of a car on the highway. In addition, the patient has a history of heroin use and was previously exonerated from a murder charge for self defense. Apparently, she killed her husband. The patient states that she has had 5-6 loose stools prior to coming to the emergency department. She cannot say whether she has had any hematochezia or melena. She has not had any hematemesis or coffee grounds emesis. The patient complains of subjective fevers and chills. She denied any chest pain, shortness breath, palpitations, syncope, dysuria, hematuria. After the patient expressed suicidal ideation, she was noted to have a fever of 101.44F in the emergency department. Urinalysis did not suggest UTI. Lactic acid was 1.1. The patient did complain of generalized myalgias and arthralgias without any headaches, sore throat, rashes, or synovitis. She denied any other intravenous drug use.  Consultations:  None  Procedures:  None  HOSPITAL COURSE:   C Diff Patient initially presented with diarrhea. However her diarrhea stopped and a stool sample was not obtained initially. After 4 days a sample was obtained which revealed c diff. Due to her symptoms of nausea flagyl was not prescribed and patient was given oral vancomycin. She is having 2 loose stools a day. No abdominal pain with benign abdominal examination.   Fever  Etiology was not clear. She was initiated on iv vanc and imipenem. Blood cultures were negative. Vanc was stopped. Urine cultures were negative and so imipenem was stopped. Now it appears that the fever could have been from c diff. Influenza was negative as well.    Abdominal pain  Possibly from c diff. Now improved. Multiple  CT scans did not show any acute findings. [place] was unremarkable as well. She reports a recent EGD by Dr. [name] which only showed hiatal hernia. Has an appointment with him next week. Continue PPI despite c diff for now till evaluation by GI next wekk.   Intractable  vomiting Patient continued to have persistent nausea. Etiology remained unclear. [place] did not reveal abnormalities with gallbladder. Repeat LFT normal. Due to her history of DM gastroparesis was considered. A GES was ordered but could not be done to lack of the medication needed to do the test. She was empirically started on Reglan and her symptoms have resolved.  Suicidal ideation/history of bipolar disorder/anxiety  This appears to be situational. Psychiatry has seen and has signed off. Started on risperdal which will be continued.   Tachycardia  Possibly from anxiety. Was started on metoprolol with improvement. TSH was normal.   Hypokalemia  This was low presumably from GI loss. This was repleted. Mg was normal.  Overall patient has improved. She is tolerating her diet. No significant diarrhea. Abdomen is soft and benign. She is stable for discharge.  PERTINENT LABS:  The results of significant diagnostics from this hospitalization (including imaging, microbiology, ancillary and laboratory) are listed below for reference.    Microbiology: Recent Results (from the past 240 hour(s))  CULTURE, BLOOD (ROUTINE X 2)     Status: None   Collection Time    12/29/12  4:50 PM      Result Value Range Status   Specimen Description BLOOD LEFT HAND   Final   Special Requests BOTTLES DRAWN AEROBIC AND ANAEROBIC 3CC EACH   Final   Culture  Setup Time 12/29/2012 23:15   Final   Culture     Final   Value:        BLOOD CULTURE RECEIVED NO GROWTH TO DATE CULTURE WILL BE HELD FOR 5 DAYS BEFORE ISSUING A FINAL NEGATIVE REPORT   Report Status PENDING   Incomplete  CULTURE, BLOOD (ROUTINE X 2)     Status: None   Collection Time    12/29/12  4:56 PM      Result Value Range Status   Specimen Description BLOOD LEFT HAND   Final   Special Requests BOTTLES DRAWN AEROBIC AND ANAEROBIC 4CC EACH   Final   Culture  Setup Time 12/29/2012 23:15   Final   Culture     Final   Value:        BLOOD CULTURE RECEIVED NO  GROWTH TO DATE CULTURE WILL BE HELD FOR 5 DAYS BEFORE ISSUING A FINAL NEGATIVE REPORT   Report Status PENDING   Incomplete  URINE CULTURE     Status: None   Collection Time    12/30/12 10:03 PM      Result Value Range Status   Specimen Description URINE, CLEAN CATCH   Final   Special Requests NONE   Final   Culture  Setup Time 12/31/2012 04:03   Final   Colony Count NO GROWTH   Final   Culture NO GROWTH   Final   Report Status 01/01/2013 FINAL   Final  CLOSTRIDIUM DIFFICILE BY PCR     Status: Abnormal   Collection Time    01/02/13  6:51 AM      Result Value Range Status   C difficile by pcr POSITIVE (*) NEGATIVE Final   Comment: CRITICAL RESULT CALLED TO, READ BACK BY AND VERIFIED WITH:     HERNDONF RN 10:45 01/02/13 ([email or web address])  STOOL  CULTURE     Status: None   Collection Time    01/02/13  6:51 AM      Result Value Range Status   Specimen Description STOOL   Final   Special Requests NONE   Final   Culture Culture reincubated for better growth   Final   Report Status PENDING   Incomplete     Labs: Basic Metabolic Panel:  Recent Labs Lab 12/29/12 0949 12/30/12 0510 12/31/12 0455 01/01/13 0605 01/02/13 0555 01/02/13 1020 01/03/13 0540  NA 143 138 137 139 136  --  137  K 3.4* 2.6* 3.1* 3.5 3.3*  --  3.6  CL 107 98 100 107 103  --  104  CO2 24 26 22 25 26   --  28  GLUCOSE 116* 132* 141* 110* 114*  --  112*  BUN 20 10 23 23 10   --  9  CREATININE 0.58 0.47* 0.81 0.54 0.52  --  0.53  CALCIUM 9.0 9.4 9.7 8.7 8.7  --  8.6  MG  --  1.8  --   --   --  1.9  --    Liver Function Tests:  Recent Labs Lab 12/29/12 1454 12/30/12 0510 01/01/13 1400 01/02/13 0555  AST 38* 42* 19 15  ALT 28 32 22 17  ALKPHOS 70 73 55 45  BILITOT 0.7 1.1 0.7 0.6  PROT 7.1 7.4 6.6 5.3*  ALBUMIN 3.9 4.2 3.2* 2.7*    Recent Labs Lab 12/29/12 1454 01/01/13 1400  LIPASE 9* 13   CBC:  Recent Labs Lab 12/29/12 0949 12/29/12 1418 12/30/12 0510 12/31/12 0455 01/02/13 0555  WBC 9.1  10.5 12.3* 10.7* 5.1  NEUTROABS  --  6.2  --   --   --   HGB 11.7* 11.9* 12.8 13.6 11.4*  HCT 36.7 36.5 38.6 40.8 35.2*  MCV 90.2 89.2 87.3 88.9 90.0  PLT 358 343 352 355 275    IMAGING STUDIES Ct Abdomen Pelvis Wo Contrast  12/28/2012  *RADIOLOGY REPORT*  Clinical Data:  62 year old female with abdominal pain nausea and vomiting.  CT ABDOMEN AND PELVIS WITHOUT CONTRAST  Technique:  Multidetector CT imaging of the abdomen and pelvis was performed following the standard protocol without intravenous contrast.  Comparison:  CT abdomen and pelvis with contrast 12/26/2012.  Findings:  Interval significantly reduced right pleural effusion, now only trace.  No left pleural or pericardial effusion.  Stable cardiomegaly.  Lung bases essentially clear.  Stable visualized osseous structures.  Mild anterolisthesis L5 on S1 with facet degeneration again noted.  No pelvic free fluid.  Oral contrast in the rectum.  Negative noncontrast uterus and adnexa.  Contrast mixed with urine in the bladder.  Negative distal colon.  Left and distal transverse colon decompressed which probably accounts for the appearance of wall thickening.  No associated mesenteric inflammation.  Negative proximal transverse colon and hepatic flexure.  Normal appendix containing contrast.  No dilated small bowel.  Contrast in the stomach and duodenum which appear within normal limits.  Noncontrast liver, gallbladder, spleen, pancreas and adrenal glands are within normal limits.  Small volume of excrete contrast in nondilated renal collecting systems.  Kidneys within normal limits.  No hydroureter.  No abdominal free fluid.  No focal inflammatory stranding identified.  IMPRESSION: 1.  Decreased right pleural effusion, with only trace residual. 2.  No bowel obstruction, acute or inflammatory change identified in the abdomen or pelvis.  Normal appendix.   Original Report Authenticated By: Erskine Speed, M.D.  US Abdomen Complete  01/01/2013   *RADIOLOGY REPORT*  Clinical Data:  Nausea, right upper quadrant pain, history hypertension, diabetes  ULTRASOUND ABDOMEN:  Technique:  Sonography of upper abdominal structures was performed.  Comparison:  08/08/2012  Gallbladder:  Normally distended without stones or wall thickening. No pericholecystic fluid or sonographic Murphy sign.  Common bile duct:  Upper normal caliber 6 mm diameter  Liver:  Normal appearance  IVC:  Normal appearance  Pancreas:  Incomplete visualization of distal pancreatic tail due to obscuration by bowel gas.  Visualized portions of pancreas normal appearance.  Spleen:  Normal appearance, 4.5 cm length  Right kidney:  11.2 cm length. Normal morphology without mass or hydronephrosis.  Left kidney:  11.2 cm length. Normal morphology without mass or hydronephrosis.  Aorta:  Distal portion obscured by bowel gas.  Proximally normal caliber.  Other: No free fluid  IMPRESSION: Incomplete visualization of distal pancreatic tail and distal abdominal aorta. Otherwise negative exam.   Original Report Authenticated By: Ulyses Southward, M.D.    Ct Abdomen Pelvis W Contrast  12/26/2012  *RADIOLOGY REPORT*  Clinical Data: Abdominal pain, nausea and vomiting.  CT ABDOMEN AND PELVIS WITH CONTRAST  Technique:  Multidetector CT imaging of the abdomen and pelvis was performed following the standard protocol during bolus administration of intravenous contrast.  Contrast: 80mL OMNIPAQUE IOHEXOL 300 MG/ML  SOLN  Comparison: Chest and two views abdomen 12/26/2012.  CT abdomen and pelvis 07/17/2012.  Findings: Small right pleural effusion is identified.  There is no left pleural effusion or pericardial effusion.  The lung bases are clear.  The gallbladder, spleen, adrenal glands, pancreas, biliary tree and kidneys appear normal. Possible small flash filling hemangioma in the dome of the liver seen on the most recent study is not visible today.  Uterus, adnexa and urinary bladder are unremarkable.  The stomach, small  and large bowel and appendix appear normal.  No focal bony abnormality is identified.  IMPRESSION:  1.  Small right pleural effusion. 2.  No acute finding in the abdomen or pelvis.   Original Report Authenticated By: Holley Dexter, M.D.     Dg Abd Acute W/chest  12/29/2012  *RADIOLOGY REPORT*  Clinical Data: Pain, fever  ACUTE ABDOMEN SERIES (ABDOMEN 2 VIEW & CHEST 1 VIEW)  Comparison: Chest x-ray of 12/26/2012  Findings: Staples are noted medially overlying the right upper lung field.  No active infiltrate or effusion is seen.  Mild cardiomegaly is stable.  Supine and erect views of the abdomen show some residual contrast throughout the nondistended colon.  No bowel obstruction is seen. No free air is noted.  IMPRESSION:  1.  Cardiomegaly.  Postoperative change on the right.  No active lung disease. 2.  No bowel obstruction. No free air.  Residual barium in the nondistended colon.   Original Report Authenticated By: Dwyane Dee, M.D.    Dg Abd Acute W/chest  12/26/2012  *RADIOLOGY REPORT*  Clinical Data: Abdominal pain and nausea.  ACUTE ABDOMEN SERIES (ABDOMEN 2 VIEW & CHEST 1 VIEW)  Comparison: Chest x-ray 12/26/2012.  Findings: The upright chest x-ray is stable.  Minimal streaky areas of subsegmental atelectasis are noted.  Two views of the abdomen demonstrate an unremarkable bowel gas pattern.  No findings for obstruction and/or perforation.  The soft tissue shadows are maintained.  No free air.  No worrisome calcifications.  The bony structures are intact.  IMPRESSION:  1.  Streaky areas of atelectasis but no infiltrates or effusions. 2.  No plain film  findings for an acute abdominal process.   Original Report Authenticated By: Rudie Meyer, M.D.    Dg Abd Portable 1v  12/30/2012  *RADIOLOGY REPORT*  Clinical Data: Emesis and abdominal discomfort.  PORTABLE ABDOMEN - 1 VIEW  Comparison: Acute abdominal series 12/29/2012.  Findings: The bowel gas pattern is unremarkable.  There is no evidence for  obstruction or free air.  There is some residual contrast material within the colon, decreased from prior study. The lung bases are clear.  IMPRESSION:  1.  No acute abnormality. 2.  Decreased contrast within the colon.   Original Report Authenticated By: Marin Roberts, M.D.     DISCHARGE EXAMINATION: Filed Vitals:   01/02/13 0600 01/02/13 1316 01/02/13 2154 01/03/13 0541  BP: 108/59 127/79 133/79 142/87  Pulse: 95 93 91 88  Temp: 98.4 F (36.9 C) 98 F (36.7 C) 99.2 F (37.3 C) 98.5 F (36.9 C)  TempSrc: Oral Oral Oral Oral  Resp: 18 18 18 20   Height:      Weight:      SpO2: 100% 97% 94% 96%   General appearance: alert, cooperative, appears stated age and no distress Resp: clear to auscultation bilaterally Cardio: regular rate and rhythm, S1, S2 normal, no murmur, click, rub or gallop GI: soft, non-tender; bowel sounds normal; no masses,  no organomegaly Extremities: extremities normal, atraumatic, no cyanosis or edema Neurologic: Alert and oriented X 3, normal strength and tone. Normal symmetric reflexes. Normal coordination and gait  DISPOSITION: Home  Discharge Orders   Future Appointments Provider Department Dept Phone   03/13/2013 10:00 AM Delight Ovens, MD [facility] [phone]   Future Orders Complete By Expires     Diet Carb Modified  As directed     Discharge instructions  As directed     Comments:      Be sure to follow up with your doctor next week to discuss getting a gastric emptying study at some point. Seek attention if your diarrhea gets worse.    Increase activity slowly  As directed       Current Discharge Medication List    START taking these medications   Details  metoCLOPramide (REGLAN) 5 MG tablet Take 1 tablet (5 mg total) by mouth 4 (four) times daily -  before meals and at bedtime. Qty: 120 tablet, Refills: 2    metoprolol tartrate (LOPRESSOR) 12.5 mg TABS Take 0.5 tablets (12.5 mg total) by  mouth 2 (two) times daily. Qty: 30 tablet, Refills: 1    risperiDONE (RISPERDAL) 1 MG tablet Take 1 tablet (1 mg total) by mouth 2 (two) times daily. Qty: 60 tablet, Refills: 0    saccharomyces boulardii (FLORASTOR) 250 MG capsule Take 1 capsule (250 mg total) by mouth 2 (two) times daily. Qty: 30 capsule, Refills: 0    vancomycin (VANCOCIN) 50 mg/mL oral solution Take 2.5 mLs (125 mg total) by mouth every 6 (six) hours. For 14 days Qty: 14 mL, Refills: 0      CONTINUE these medications which have NOT CHANGED   Details  albuterol (PROVENTIL HFA;VENTOLIN HFA) 108 (90 BASE) MCG/ACT inhaler Inhale 2 puffs into the lungs every 6 (six) hours as needed (for shortness of breath). For shortness of breath    ALPRAZolam (XANAX) 1 MG tablet Take 0.5 mg by mouth 2 (two) times daily as needed (for sleep or anxiety). For sleep and anxiety    aspirin 81 MG tablet Take 81 mg by mouth every morning.  dexlansoprazole (DEXILANT) 60 MG capsule Take 60 mg by mouth every morning.     ergocalciferol (VITAMIN D2) 50000 UNITS capsule Take 50,000 Units by mouth once a week.    gabapentin (NEURONTIN) 400 MG capsule Take 400 mg by mouth 3 (three) times daily.     HYDROcodone-acetaminophen (NORCO/VICODIN) 5-325 MG per tablet Take 1-2 tablets by mouth every 6 (six) hours as needed for pain (for pain).    loratadine (CLARITIN) 10 MG tablet Take 10 mg by mouth every morning.     metFORMIN (GLUCOPHAGE) 500 MG tablet Take 500 mg by mouth 2 (two) times daily with a meal.     mirtazapine (REMERON) 15 MG tablet Take 15 mg by mouth at bedtime.    Olmesartan-Amlodipine-HCTZ (TRIBENZOR) 40-10-25 MG TABS Take 1 tablet by mouth every evening.     ondansetron (ZOFRAN) 4 MG tablet Take 4 mg by mouth every 6 (six) hours as needed (for nausea or vomiting).      STOP taking these medications     bismuth subsalicylate (PEPTO BISMOL) 262 MG/15ML suspension      loperamide (IMODIUM) 2 MG capsule        Follow-up  Information   Follow up with [name], MD. Schedule an appointment as soon as possible for a visit in 5 days. (post hospitalization visit. And to discuss a gstric emptying study at some point.)    Contact information:   [address] [place] [address] [phone]       Follow up with EDWARDS Burna Mortimer, MD. (see as scheduled)    Contact information:   [address]                         [place] [address] [phone]       TOTAL DISCHARGE TIME: 35 mins  [facility]  [facility] Pager [phone]  01/03/2013, 10:28 AM

## 2013-01-04 LAB — CULTURE, BLOOD (ROUTINE X 2): Culture: NO GROWTH

## 2013-01-06 LAB — STOOL CULTURE

## 2013-01-12 ENCOUNTER — Emergency Department (HOSPITAL_COMMUNITY): Payer: Medicaid Other

## 2013-01-12 ENCOUNTER — Encounter (HOSPITAL_COMMUNITY): Payer: Self-pay | Admitting: Emergency Medicine

## 2013-01-12 ENCOUNTER — Emergency Department (HOSPITAL_COMMUNITY)
Admission: EM | Admit: 2013-01-12 | Discharge: 2013-01-12 | Disposition: A | Discharge status: Home / Self Care | Payer: Medicaid Other | Attending: Emergency Medicine | Admitting: Emergency Medicine

## 2013-01-12 DIAGNOSIS — E119 Type 2 diabetes mellitus without complications: Secondary | ICD-10-CM | POA: Insufficient documentation

## 2013-01-12 DIAGNOSIS — F17200 Nicotine dependence, unspecified, uncomplicated: Secondary | ICD-10-CM | POA: Insufficient documentation

## 2013-01-12 DIAGNOSIS — I10 Essential (primary) hypertension: Secondary | ICD-10-CM | POA: Insufficient documentation

## 2013-01-12 DIAGNOSIS — G8929 Other chronic pain: Secondary | ICD-10-CM | POA: Insufficient documentation

## 2013-01-12 DIAGNOSIS — Z7982 Long term (current) use of aspirin: Secondary | ICD-10-CM | POA: Insufficient documentation

## 2013-01-12 DIAGNOSIS — F411 Generalized anxiety disorder: Secondary | ICD-10-CM | POA: Insufficient documentation

## 2013-01-12 DIAGNOSIS — Z8739 Personal history of other diseases of the musculoskeletal system and connective tissue: Secondary | ICD-10-CM | POA: Insufficient documentation

## 2013-01-12 DIAGNOSIS — F319 Bipolar disorder, unspecified: Secondary | ICD-10-CM | POA: Insufficient documentation

## 2013-01-12 DIAGNOSIS — Z8719 Personal history of other diseases of the digestive system: Secondary | ICD-10-CM | POA: Insufficient documentation

## 2013-01-12 DIAGNOSIS — M545 Low back pain, unspecified: Secondary | ICD-10-CM | POA: Insufficient documentation

## 2013-01-12 DIAGNOSIS — Z79899 Other long term (current) drug therapy: Secondary | ICD-10-CM | POA: Insufficient documentation

## 2013-01-12 DIAGNOSIS — M549 Dorsalgia, unspecified: Secondary | ICD-10-CM

## 2013-01-12 HISTORY — DX: Low back pain: M54.5

## 2013-01-12 HISTORY — DX: Other intervertebral disc degeneration, lumbar region: M51.36

## 2013-01-12 HISTORY — DX: Other chronic pain: G89.29

## 2013-01-12 HISTORY — DX: Other intervertebral disc degeneration, lumbar region without mention of lumbar back pain or lower extremity pain: M51.369

## 2013-01-12 HISTORY — DX: Low back pain, unspecified: M54.50

## 2013-01-12 HISTORY — DX: Pain in unspecified shoulder: M25.519

## 2013-01-12 HISTORY — DX: Cervicalgia: M54.2

## 2013-01-12 MED ORDER — FENTANYL CITRATE 0.05 MG/ML IJ SOLN
50.0000 ug | Freq: Once | INTRAMUSCULAR | Status: AC
  Administered 2013-01-12: 50 ug via INTRAMUSCULAR
  Filled 2013-01-12: qty 2

## 2013-01-12 MED ORDER — HYDROCODONE-ACETAMINOPHEN 5-325 MG PO TABS: ORAL_TABLET | ORAL | Status: DC

## 2013-01-12 MED ORDER — FENTANYL CITRATE 0.05 MG/ML IJ SOLN
50.0000 ug | Freq: Once | INTRAMUSCULAR | Status: AC
  Administered 2013-01-12: 50 ug via NASAL
  Filled 2013-01-12: qty 2

## 2013-01-12 NOTE — ED Provider Notes (Signed)
History     CSN: [id number]  Arrival date & time 01/12/13  1121   First MD Initiated Contact with Patient 01/12/13 1146      Chief Complaint  Patient presents with  . Sciatica    HPI Pt was seen at 1155.  Per pt, c/o gradual onset and persistence of constant acute flair of her chronic left sided low back "pain" for the past several days.  Describes the pain as radiating into her left hip and "down my left leg." Denies any change in her usual chronic pain pattern.  Pain worsens with palpation of the area and body position changes. Denies incont/retention of bowel or bladder, no saddle anesthesia, no focal motor weakness, no tingling/numbness in extremities, no fevers, no injury, no abd pain.   The symptoms have been associated with no other complaints. The patient has a significant history of similar symptoms previously, recently being evaluated for this complaint and multiple prior evals for same.     Past Medical History  Diagnosis Date  . Hypertension   . Diabetes mellitus   . Bipolar 1 disorder   . Heart murmur   . Sciatica   . Arthritis   . Hepatitis     hep c ?, pt states thinks  . Anxiety   . Diverticulosis   . Chronic low back pain   . DDD (degenerative disc disease), lumbar   . Chronic shoulder pain   . Chronic neck pain     Past Surgical History  Procedure Laterality Date  . Facial tumor removal    . Brain tumor removal    . Brain surgery      in [place]  . Video bronchoscopy  12/04/2012    Procedure: VIDEO BRONCHOSCOPY;  Surgeon: Delight Ovens, MD;  Location: [facility] OR;  Service: Thoracic;  Laterality: N/A;  . Video assisted thoracoscopy (vats)/wedge resection  12/04/2012    Procedure: VIDEO ASSISTED THORACOSCOPY (VATS)/WEDGE RESECTION;  Surgeon: Delight Ovens, MD;  Location: [facility] OR;  Service: Thoracic;  Laterality: Right;  . Lobectomy  12/04/2012    Procedure: LOBECTOMY;  Surgeon: Delight Ovens, MD;  Location: [facility] OR;  Service: Thoracic;  Laterality:  Right;  completion of right upper lobectomy and lymph node disection, placement of on q pump    Family History  Problem Relation Age of Onset  . Hypertension Father     deceased  . Diabetes Father   . Heart disease Father   . Hyperlipidemia Father   . Hypertension Mother   . Hyperlipidemia Mother   . Diabetes Mother   . Hypertension Sister   . Hyperlipidemia Sister   . Hypertension Brother   . Hyperlipidemia Brother     History  Substance Use Topics  . Smoking status: Current Some Day Smoker -- 10 years    Types: Cigarettes  . Smokeless tobacco: Never Used     Comment: Smokes 1/2 pack per week  . Alcohol Use: No     Comment: quit 9yrs ago    Review of Systems ROS: Statement: All systems negative except as marked or noted in the HPI; Constitutional: Negative for fever and chills. ; ; Eyes: Negative for eye pain, redness and discharge. ; ; ENMT: Negative for ear pain, hoarseness, nasal congestion, sinus pressure and sore throat. ; ; Cardiovascular: Negative for chest pain, palpitations, diaphoresis, dyspnea and peripheral edema. ; ; Respiratory: Negative for cough, wheezing and stridor. ; ; Gastrointestinal: Negative for nausea, vomiting, diarrhea, abdominal pain, blood in  stool, hematemesis, jaundice and rectal bleeding. . ; ; Genitourinary: Negative for dysuria, flank pain and hematuria. ; ; Musculoskeletal: +LBP. Negative for neck pain. Negative for swelling and trauma.; ; Skin: Negative for pruritus, rash, abrasions, blisters, bruising and skin lesion.; ; Neuro: Negative for headache, lightheadedness and neck stiffness. Negative for weakness, altered level of consciousness , altered mental status, extremity weakness, paresthesias, involuntary movement, seizure and syncope.       Allergies  Haldol; Morphine and related; and Penicillins  Home Medications   Current Outpatient Rx  Name  Route  Sig  Dispense  Refill  . albuterol (PROVENTIL HFA;VENTOLIN HFA) 108 (90 BASE)  MCG/ACT inhaler   Inhalation   Inhale 2 puffs into the lungs every 6 (six) hours as needed (for shortness of breath). For shortness of breath         . ALPRAZolam (XANAX) 1 MG tablet   Oral   Take 0.5 mg by mouth 2 (two) times daily as needed (for sleep or anxiety). For sleep and anxiety         . aspirin 81 MG tablet   Oral   Take 81 mg by mouth every morning.          Marland Kitchen dexlansoprazole (DEXILANT) 60 MG capsule   Oral   Take 60 mg by mouth every morning.          . ergocalciferol (VITAMIN D2) 50000 UNITS capsule   Oral   Take 50,000 Units by mouth every Monday.         . gabapentin (NEURONTIN) 400 MG capsule   Oral   Take 400 mg by mouth 3 (three) times daily.          . hydrochlorothiazide (HYDRODIURIL) 25 MG tablet   Oral   Take 25 mg by mouth daily.         Marland Kitchen HYDROcodone-acetaminophen (NORCO/VICODIN) 5-325 MG per tablet   Oral   Take 1-2 tablets by mouth every 6 (six) hours as needed for pain (for pain).         Marland Kitchen loratadine (CLARITIN) 10 MG tablet   Oral   Take 10 mg by mouth every morning.          . metFORMIN (GLUCOPHAGE) 500 MG tablet   Oral   Take 500 mg by mouth 2 (two) times daily with a meal.          . metoCLOPramide (REGLAN) 5 MG tablet   Oral   Take 1 tablet (5 mg total) by mouth 4 (four) times daily -  before meals and at bedtime.   120 tablet   2   . mirtazapine (REMERON) 15 MG tablet   Oral   Take 15 mg by mouth at bedtime.         . Olmesartan-Amlodipine-HCTZ (TRIBENZOR) 40-10-25 MG TABS   Oral   Take 1 tablet by mouth every evening.          . ondansetron (ZOFRAN) 4 MG tablet   Oral   Take 4 mg by mouth every 6 (six) hours as needed (for nausea or vomiting).         . simvastatin (ZOCOR) 20 MG tablet   Oral   Take 20 mg by mouth every evening.         . vancomycin (VANCOCIN) 125 MG capsule   Oral   Take 125 mg by mouth 4 (four) times daily. For 14 days         . metoprolol tartrate (LOPRESSOR)  25 MG  tablet   Oral   Take 12.5 mg by mouth 2 (two) times daily.           BP 122/60  Pulse 97  Temp(Src) 98.9 F (37.2 C) (Oral)  Resp 18  SpO2 96%  Physical Exam 1200: Physical examination:  Nursing notes reviewed; Vital signs and O2 SAT reviewed;  Constitutional: Well developed, Well nourished, Well hydrated, In no acute distress; Head:  Normocephalic, atraumatic; Eyes: EOMI, PERRL, No scleral icterus; ENMT: Mouth and pharynx normal, Mucous membranes moist; Neck: Supple, Full range of motion, No lymphadenopathy; Cardiovascular: Regular rate and rhythm, No gallop; Respiratory: Breath sounds clear & equal bilaterally, No rales, rhonchi, wheezes.  Speaking full sentences with ease, Normal respiratory effort/excursion; Chest: Nontender, Movement normal; Abdomen: Soft, Nontender, Nondistended, Normal bowel sounds; Genitourinary: No CVA tenderness; Spine:  No midline CS, TS, LS tenderness.  +mild TTP left lumbar paraspinal and SI joint areas.;; Extremities: Pelvis stable. Pulses normal, No tenderness, No edema, No calf edema or asymmetry.; Neuro: AA&Ox3, Major CN grossly intact.  Speech clear. Strength 5/5 equal bilat UE's and LE's, including great toe dorsiflexion.  DTR 2/4 equal bilat UE's and LE's.  No gross sensory deficits.  Neg straight leg raises bilat. Climbs on and off stretcher easily by herself. Gait steady..; Skin: Color normal, Warm, Dry.   ED Course  Procedures     MDM  MDM Reviewed: previous chart, nursing note and vitals Reviewed previous: x-ray and CT scan Interpretation: x-ray     Dg Lumbar Spine Complete 01/12/2013  *RADIOLOGY REPORT*  Clinical Data: Low back pain  LUMBAR SPINE - COMPLETE 4+ VIEW  Comparison: 12/28/2012  Findings: Five views of the lumbar spine submitted.  No acute fracture or subluxation.  Mild disc space flattening with minimal anterior spurring at L3-L4 level.  There is mild disc space flattening at L5 S1 level. Again noted about 4 mm anterolisthesis L5  on S1 vertebral body.  Facet degenerative changes at L5 level.  IMPRESSION:  No acute fracture or subluxation.  Mild disc space flattening with minimal anterior spurring at L3-L4 level.  There is mild disc space flattening at L5 S1 level. Again noted about 4 mm anterolisthesis L5 on S1 vertebral body.  Facet degenerative changes at L5 level.   Original Report Authenticated By: Natasha Mead, M.D.    Dg Hip Complete Left 01/12/2013  *RADIOLOGY REPORT*  Clinical Data: Low back pain, intermittent left hip pain  LEFT HIP - COMPLETE 2+ VIEW  Comparison: 11/08/2012  Findings: Three views of the left hip submitted.  No acute fracture or subluxation.  Mild degenerative changes with mild superior acetabular spurring.  IMPRESSION: No acute fracture or subluxation.  Mild superior acetabular spurring.   Original Report Authenticated By: Natasha Mead, M.D.      1345:  Pt has been walking around the ED with upright steady gait, easy resps.  States "that stuff up my nose was like water" and wants "a shot of pain medicine and then get me out of here." Also states she "wants something stronger than lortab."  Explained I will give her an IM injection of fentanyl but will not rx any "stronger" narcotic than lortab.  Verb understanding and states "well then just get me out of here." Long hx of chronic pain with multiple ED visits for same.  Pt endorses acute flair of her usual long standing chronic pain today, no change from her usual chronic pain pattern. No red flags on exam. Pt strongly encouraged  to f/u with her PMD and Pain Management doctor for good continuity of care and control of her chronic pain.  Verb understanding.          Laray Anger, DO 01/13/13 1953

## 2013-01-12 NOTE — ED Notes (Signed)
Pt has hx of sciatica. Pt began having pain lastin left hip and leg  night. Pt took lortab for pain but it did not work. Pt unable to place full pressure on left leg.

## 2013-02-03 ENCOUNTER — Other Ambulatory Visit: Payer: Self-pay | Admitting: Gastroenterology

## 2013-02-04 ENCOUNTER — Encounter (HOSPITAL_COMMUNITY): Payer: Self-pay | Admitting: *Deleted

## 2013-02-04 ENCOUNTER — Emergency Department (HOSPITAL_COMMUNITY)
Admission: EM | Admit: 2013-02-04 | Discharge: 2013-02-04 | Disposition: A | Discharge status: Home / Self Care | Payer: Medicaid Other | Attending: Emergency Medicine | Admitting: Emergency Medicine

## 2013-02-04 ENCOUNTER — Emergency Department (HOSPITAL_COMMUNITY): Payer: Medicaid Other

## 2013-02-04 DIAGNOSIS — G8929 Other chronic pain: Secondary | ICD-10-CM | POA: Insufficient documentation

## 2013-02-04 DIAGNOSIS — R112 Nausea with vomiting, unspecified: Secondary | ICD-10-CM | POA: Insufficient documentation

## 2013-02-04 DIAGNOSIS — M545 Low back pain, unspecified: Secondary | ICD-10-CM | POA: Insufficient documentation

## 2013-02-04 DIAGNOSIS — K5900 Constipation, unspecified: Secondary | ICD-10-CM | POA: Insufficient documentation

## 2013-02-04 DIAGNOSIS — Z8739 Personal history of other diseases of the musculoskeletal system and connective tissue: Secondary | ICD-10-CM | POA: Insufficient documentation

## 2013-02-04 DIAGNOSIS — F17200 Nicotine dependence, unspecified, uncomplicated: Secondary | ICD-10-CM | POA: Insufficient documentation

## 2013-02-04 DIAGNOSIS — I10 Essential (primary) hypertension: Secondary | ICD-10-CM | POA: Insufficient documentation

## 2013-02-04 DIAGNOSIS — E119 Type 2 diabetes mellitus without complications: Secondary | ICD-10-CM | POA: Insufficient documentation

## 2013-02-04 DIAGNOSIS — Z8719 Personal history of other diseases of the digestive system: Secondary | ICD-10-CM | POA: Insufficient documentation

## 2013-02-04 DIAGNOSIS — R011 Cardiac murmur, unspecified: Secondary | ICD-10-CM | POA: Insufficient documentation

## 2013-02-04 DIAGNOSIS — Z7982 Long term (current) use of aspirin: Secondary | ICD-10-CM | POA: Insufficient documentation

## 2013-02-04 DIAGNOSIS — Z79899 Other long term (current) drug therapy: Secondary | ICD-10-CM | POA: Insufficient documentation

## 2013-02-04 DIAGNOSIS — M542 Cervicalgia: Secondary | ICD-10-CM | POA: Insufficient documentation

## 2013-02-04 DIAGNOSIS — F411 Generalized anxiety disorder: Secondary | ICD-10-CM | POA: Insufficient documentation

## 2013-02-04 DIAGNOSIS — Z8619 Personal history of other infectious and parasitic diseases: Secondary | ICD-10-CM | POA: Insufficient documentation

## 2013-02-04 DIAGNOSIS — R109 Unspecified abdominal pain: Secondary | ICD-10-CM | POA: Insufficient documentation

## 2013-02-04 DIAGNOSIS — M25519 Pain in unspecified shoulder: Secondary | ICD-10-CM | POA: Insufficient documentation

## 2013-02-04 LAB — COMPREHENSIVE METABOLIC PANEL
ALT: 16 U/L (ref 0–35)
Alkaline Phosphatase: 89 U/L (ref 39–117)
BUN: 8 mg/dL (ref 6–23)
CO2: 25 mEq/L (ref 19–32)
Chloride: 97 mEq/L (ref 96–112)
GFR calc Af Amer: >90 mL/min (ref >90)
GFR calc non Af Amer: >90 mL/min (ref >90)
Glucose, Bld: 106 mg/dL — ABNORMAL HIGH (ref 70–99)
Potassium: 2.9 mEq/L — ABNORMAL LOW (ref 3.5–5.1)
Sodium: 134 mEq/L — ABNORMAL LOW (ref 135–145)
Total Bilirubin: 0.9 mg/dL (ref 0.3–1.2)
Total Protein: 7.9 g/dL (ref 6.0–8.3)

## 2013-02-04 LAB — RAPID URINE DRUG SCREEN, HOSP PERFORMED
Barbiturates: NOT DETECTED
Cocaine: NOT DETECTED
Tetrahydrocannabinol: POSITIVE — AB

## 2013-02-04 LAB — CBC
HCT: 45.6 % (ref 36.0–46.0)
Hemoglobin: 15 g/dL (ref 12.0–15.0)
RBC: 5.32 MIL/uL — ABNORMAL HIGH (ref 3.87–5.11)
WBC: 11.2 10*3/uL — ABNORMAL HIGH (ref 4.0–10.5)

## 2013-02-04 LAB — ETHANOL: Alcohol, Ethyl (B): <11 mg/dL (ref 0–11)

## 2013-02-04 LAB — LIPASE, BLOOD: Lipase: 18 U/L (ref 11–59)

## 2013-02-04 MED ORDER — ONDANSETRON HCL 4 MG PO TABS: 4.0000 mg | ORAL_TABLET | Freq: Three times a day (TID) | ORAL | Status: DC | PRN

## 2013-02-04 MED ORDER — ONDANSETRON HCL 4 MG/2ML IJ SOLN
4.0000 mg | Freq: Once | INTRAMUSCULAR | Status: DC
  Filled 2013-02-04: qty 2

## 2013-02-04 MED ORDER — ONDANSETRON 8 MG PO TBDP
8.0000 mg | ORAL_TABLET | Freq: Once | ORAL | Status: AC
  Administered 2013-02-04: 8 mg via ORAL
  Filled 2013-02-04: qty 1

## 2013-02-04 MED ORDER — HYDROMORPHONE HCL PF 1 MG/ML IJ SOLN
1.0000 mg | Freq: Once | INTRAMUSCULAR | Status: AC
  Administered 2013-02-04: 1 mg via INTRAMUSCULAR
  Filled 2013-02-04: qty 1

## 2013-02-04 MED ORDER — ONDANSETRON HCL 4 MG/2ML IJ SOLN: 4.0000 mg | Freq: Once | INTRAMUSCULAR | Status: DC

## 2013-02-04 MED ORDER — POLYETHYLENE GLYCOL 3350 17 G PO PACK: 17.0000 g | PACK | Freq: Every day | ORAL | Status: DC

## 2013-02-04 MED ORDER — KETOROLAC TROMETHAMINE 30 MG/ML IJ SOLN
30.0000 mg | Freq: Once | INTRAMUSCULAR | Status: DC
  Filled 2013-02-04: qty 1

## 2013-02-04 MED ORDER — ACETAMINOPHEN 325 MG PO TABS: 650.0000 mg | ORAL_TABLET | ORAL | Status: DC | PRN

## 2013-02-04 MED ORDER — POTASSIUM CHLORIDE CRYS ER 20 MEQ PO TBCR
40.0000 meq | EXTENDED_RELEASE_TABLET | Freq: Once | ORAL | Status: AC
  Administered 2013-02-04: 40 meq via ORAL
  Filled 2013-02-04: qty 2

## 2013-02-04 MED ORDER — DICYCLOMINE HCL 20 MG PO TABS: 20.0000 mg | ORAL_TABLET | Freq: Two times a day (BID) | ORAL | Status: DC

## 2013-02-04 MED ORDER — HYDROMORPHONE HCL PF 1 MG/ML IJ SOLN: 1.0000 mg | Freq: Once | INTRAMUSCULAR | Status: DC

## 2013-02-04 MED ORDER — IBUPROFEN 600 MG PO TABS: 600.0000 mg | ORAL_TABLET | Freq: Three times a day (TID) | ORAL | Status: DC | PRN

## 2013-02-04 NOTE — ED Notes (Signed)
Tele psych papers faxed and called to confirm consult.

## 2013-02-04 NOTE — ED Notes (Signed)
Unsuccessful IV start attempts x 2 RNs. IV team paged.

## 2013-02-04 NOTE — ED Provider Notes (Signed)
History     CSN: [id number]  Arrival date & time 02/04/13  1210   First MD Initiated Contact with Patient 02/04/13 1215      Chief Complaint  Patient presents with  . Abdominal Pain    (Consider location/radiation/quality/duration/timing/severity/associated sxs/prior treatment) HPI [name] a 62 yo F presenting with nausea, vomiting, diarrhea that started at 5am this morning that she felt fine yesterday. Patient had an endoscopy performed yesterday with Dr. [name]. Patient states that Dr. [name] did not give her the results after the endoscopy. Patient states her abdominal pain is generalized and severe and that her nausea, vomiting, and diarrhea have been unremitting since this morning.    Past Medical History  Diagnosis Date  . Hypertension   . Diabetes mellitus   . Bipolar 1 disorder   . Heart murmur   . Sciatica   . Arthritis   . Hepatitis     hep c ?, pt states thinks  . Anxiety   . Diverticulosis   . Chronic low back pain   . DDD (degenerative disc disease), lumbar   . Chronic shoulder pain   . Chronic neck pain     Past Surgical History  Procedure Laterality Date  . Facial tumor removal    . Brain tumor removal    . Brain surgery      in [place]  . Video bronchoscopy  12/04/2012    Procedure: VIDEO BRONCHOSCOPY;  Surgeon: Delight Ovens, MD;  Location: [facility] OR;  Service: Thoracic;  Laterality: N/A;  . Video assisted thoracoscopy (vats)/wedge resection  12/04/2012    Procedure: VIDEO ASSISTED THORACOSCOPY (VATS)/WEDGE RESECTION;  Surgeon: Delight Ovens, MD;  Location: [facility] OR;  Service: Thoracic;  Laterality: Right;  . Lobectomy  12/04/2012    Procedure: LOBECTOMY;  Surgeon: Delight Ovens, MD;  Location: [facility] OR;  Service: Thoracic;  Laterality: Right;  completion of right upper lobectomy and lymph node disection, placement of on q pump    Family History  Problem Relation Age of Onset  . Hypertension Father     deceased  . Diabetes Father   .  Heart disease Father   . Hyperlipidemia Father   . Hypertension Mother   . Hyperlipidemia Mother   . Diabetes Mother   . Hypertension Sister   . Hyperlipidemia Sister   . Hypertension Brother   . Hyperlipidemia Brother     History  Substance Use Topics  . Smoking status: Current Some Day Smoker -- 10 years    Types: Cigarettes  . Smokeless tobacco: Never Used     Comment: Smokes 1/2 pack per week  . Alcohol Use: No     Comment: quit 47yrs ago    OB History   Grav Para Term Preterm Abortions TAB SAB Ect Mult Living                  Review of Systems  Allergies  Haldol; Morphine and related; and Penicillins  Home Medications   Current Outpatient Rx  Name  Route  Sig  Dispense  Refill  . albuterol (PROVENTIL HFA;VENTOLIN HFA) 108 (90 BASE) MCG/ACT inhaler   Inhalation   Inhale 2 puffs into the lungs every 6 (six) hours as needed (for shortness of breath). For shortness of breath         . ALPRAZolam (XANAX) 1 MG tablet   Oral   Take 0.5 mg by mouth 2 (two) times daily as needed (for sleep or anxiety). For sleep  and anxiety         . aspirin 81 MG tablet   Oral   Take 81 mg by mouth every morning.          Marland Kitchen dexlansoprazole (DEXILANT) 60 MG capsule   Oral   Take 60 mg by mouth every morning.          . ergocalciferol (VITAMIN D2) 50000 UNITS capsule   Oral   Take 50,000 Units by mouth every Monday.         . gabapentin (NEURONTIN) 400 MG capsule   Oral   Take 400 mg by mouth 3 (three) times daily.          . hydrochlorothiazide (HYDRODIURIL) 25 MG tablet   Oral   Take 25 mg by mouth daily.         Marland Kitchen HYDROcodone-acetaminophen (NORCO/VICODIN) 5-325 MG per tablet   Oral   Take 1-2 tablets by mouth every 6 (six) hours as needed for pain (for pain).         Marland Kitchen HYDROcodone-acetaminophen (NORCO/VICODIN) 5-325 MG per tablet      1 tabs PO q6 hours prn pain   12 tablet   0   . loratadine (CLARITIN) 10 MG tablet   Oral   Take 10 mg by mouth  every morning.          . metFORMIN (GLUCOPHAGE) 500 MG tablet   Oral   Take 500 mg by mouth 2 (two) times daily with a meal.          . metoCLOPramide (REGLAN) 5 MG tablet   Oral   Take 1 tablet (5 mg total) by mouth 4 (four) times daily -  before meals and at bedtime.   120 tablet   2   . metoprolol tartrate (LOPRESSOR) 25 MG tablet   Oral   Take 12.5 mg by mouth 2 (two) times daily.         . mirtazapine (REMERON) 15 MG tablet   Oral   Take 15 mg by mouth at bedtime.         . Olmesartan-Amlodipine-HCTZ (TRIBENZOR) 40-10-25 MG TABS   Oral   Take 1 tablet by mouth every evening.          . ondansetron (ZOFRAN) 4 MG tablet   Oral   Take 4 mg by mouth every 6 (six) hours as needed (for nausea or vomiting).         . simvastatin (ZOCOR) 20 MG tablet   Oral   Take 20 mg by mouth every evening.         . vancomycin (VANCOCIN) 125 MG capsule   Oral   Take 125 mg by mouth 4 (four) times daily. For 14 days           BP 177/104  Temp(Src) 97.8 F (36.6 C) (Oral)  Resp 20  SpO2 100%  Physical Exam  Constitutional: She appears well-developed and well-nourished.  HENT:  Head: Normocephalic and atraumatic.  Mouth/Throat: Oropharynx is clear and moist.  Cardiovascular: Normal rate, regular rhythm and normal heart sounds.   Pulses:      Posterior tibial pulses are 2+ on the right side, and 2+ on the left side.  Pulmonary/Chest: Effort normal and breath sounds normal.  Abdominal: Soft. Bowel sounds are normal. She exhibits no distension and no mass. There is no tenderness. There is no rebound and no guarding.  Skin: Skin is warm and dry.    ED Course  Procedures (  including critical care time)  Patient has not had loose stools or episodes of vomiting while in the ED. Patient's pain and nausea was treated in the ED.  Patient potassium was low and was replaced. Patient tolerating PO liquids and PO potassium supplements.  When the patient was told she  could be d/c home she stated she was suicidal and that she would "blow her brains out if she went home." The patient reiterated this to the nurse and myself.  Telepsych consult was placed after a discussion with Dr. Ranae Palms about the patient. It was also decided to hold pain medications until further evaluation as it may be adding to suicidal ideation.   Labs Reviewed  CBC - Abnormal; Notable for the following:    WBC 11.2 (*)    RBC 5.32 (*)    All other components within normal limits  COMPREHENSIVE METABOLIC PANEL - Abnormal; Notable for the following:    Sodium 134 (*)    Potassium 2.9 (*)    Glucose, Bld 106 (*)    Creatinine, Ser 0.41 (*)    All other components within normal limits  URINE RAPID DRUG SCREEN (HOSP PERFORMED) - Abnormal; Notable for the following:    Opiates POSITIVE (*)    Benzodiazepines POSITIVE (*)    Tetrahydrocannabinol POSITIVE (*)    All other components within normal limits  LIPASE, BLOOD  ETHANOL  CG4 I-STAT (LACTIC ACID)   Dg Abd Acute W/chest  02/04/2013  *RADIOLOGY REPORT*  Clinical Data: Abdominal pain  ACUTE ABDOMEN SERIES (ABDOMEN 2 VIEW & CHEST 1 VIEW)  Comparison: Chest radiograph - 12/26/2012; 12/19/2012; CT of the pelvis - 12/28/2012  Findings:  Grossly unchanged cardiac silhouette and mediastinal contours with mild elevation of the medial aspect the right hemidiaphragm and obscuration of the right heart border. Stable postsurgical change of the right hilum.  No focal airspace opacity.  No pleural effusion or pneumothorax.  No definite evidence of edema.  Moderate colonic stool burden without evidence of obstruction.  No definite pneumatosis or portal venous gas.  No definite abnormal intra-abdominal calcifications.  Mild scoliotic curvature of the thoracolumbar spine, convex to the left, likely positional.  IMPRESSION: 1.  Stable postsurgical change of the right lung without acute cardiopulmonary disease.  2.  Moderate colonic stool burden without  evidence of obstruction.   Original Report Authenticated By: Tacey Ruiz, MD      No diagnosis found.    MDM   [name] [name] is a 62 y.o. female with complaint of gastrointestinal symptoms of watery diarrhea, abdominal pain, nausea, vomiting for 1 days. No blood in stool. Physical exam reveals the patient appears well. Hydration status: well hydrated. Abdomen: abdomen is soft without significant tenderness, masses, organomegaly or guarding, rigidity, or no rebound tenderness. After treatment with Zofran and 1 round of pain medication the patient's symptoms have resolved. Patient appeared more comfortable. Dr. Adriana Simas and I had discussed managing the patients symptoms and discharging her home as long as her labs were stable. When the discussion of discharging [name] [name] was brought up she told the nurse and myself that if she were discharged home she would "blow her brains out." After discussing this with Dr. Ranae Palms, we decided to place a consult for Telepsych to further evaluate the patient. Patient placed in [facility] Psych ED for further evaluation.           Jeannetta Ellis, PA-C 02/04/13 2100

## 2013-02-04 NOTE — ED Notes (Signed)
Pt's daughter, Denice Cardon [phone]. Leaving, contact info.

## 2013-02-04 NOTE — ED Notes (Signed)
IV team unsuccessful, lab called for phlebotomy to come draw blood specimens.

## 2013-02-04 NOTE — ED Notes (Signed)
Patient to be discharge with daughter.

## 2013-02-04 NOTE — ED Notes (Signed)
Report received from [facility], pt is from home where she resides with her daughter, pt is ambulatory,  Pt originally came in for nausea and vomiting, but told Emmie Niemann and [place] PA she was suicidal and didn't want to go back home or she would kill herself,  When this Clinical research associate questioned her she admits to me she is suicidal but denies homicidal ideation and states she does walk , pt hasn't vomited since her arrival  Pt is alert and oriented in NAD

## 2013-02-04 NOTE — ED Notes (Signed)
Writer called patient's daughter to pick her up. Patient's daughter stated that she will pick patient up.

## 2013-02-04 NOTE — ED Notes (Signed)
One bag in locker 39

## 2013-02-04 NOTE — ED Notes (Signed)
Pt presents with n/v/d since 5am. Hx same. Had endoscopy yesterday. C. Diff last month, sts she finished abx.

## 2013-02-05 NOTE — ED Provider Notes (Signed)
Medical screening examination/treatment/procedure(s) were conducted as a shared visit with non-physician practitioner(s) and myself.  I personally evaluated the patient during the encounter.  Status post endoscopy. No acute abdomen. Patient medically cleared and transferred to psych ED for further evaluation  Donnetta Hutching, MD 02/05/13 1310

## 2013-02-19 ENCOUNTER — Emergency Department (HOSPITAL_COMMUNITY)
Admission: EM | Admit: 2013-02-19 | Discharge: 2013-02-19 | Disposition: A | Discharge status: Home / Self Care | Payer: Medicaid Other | Attending: Emergency Medicine | Admitting: Emergency Medicine

## 2013-02-19 ENCOUNTER — Encounter (HOSPITAL_COMMUNITY): Payer: Self-pay | Admitting: *Deleted

## 2013-02-19 DIAGNOSIS — E119 Type 2 diabetes mellitus without complications: Secondary | ICD-10-CM | POA: Insufficient documentation

## 2013-02-19 DIAGNOSIS — F17200 Nicotine dependence, unspecified, uncomplicated: Secondary | ICD-10-CM | POA: Insufficient documentation

## 2013-02-19 DIAGNOSIS — M25559 Pain in unspecified hip: Secondary | ICD-10-CM | POA: Insufficient documentation

## 2013-02-19 DIAGNOSIS — Z7982 Long term (current) use of aspirin: Secondary | ICD-10-CM | POA: Insufficient documentation

## 2013-02-19 DIAGNOSIS — R011 Cardiac murmur, unspecified: Secondary | ICD-10-CM | POA: Insufficient documentation

## 2013-02-19 DIAGNOSIS — F319 Bipolar disorder, unspecified: Secondary | ICD-10-CM | POA: Insufficient documentation

## 2013-02-19 DIAGNOSIS — F411 Generalized anxiety disorder: Secondary | ICD-10-CM | POA: Insufficient documentation

## 2013-02-19 DIAGNOSIS — M5430 Sciatica, unspecified side: Secondary | ICD-10-CM | POA: Insufficient documentation

## 2013-02-19 DIAGNOSIS — R209 Unspecified disturbances of skin sensation: Secondary | ICD-10-CM | POA: Insufficient documentation

## 2013-02-19 DIAGNOSIS — G8929 Other chronic pain: Secondary | ICD-10-CM | POA: Insufficient documentation

## 2013-02-19 DIAGNOSIS — M5432 Sciatica, left side: Secondary | ICD-10-CM

## 2013-02-19 DIAGNOSIS — I10 Essential (primary) hypertension: Secondary | ICD-10-CM | POA: Insufficient documentation

## 2013-02-19 DIAGNOSIS — Z8739 Personal history of other diseases of the musculoskeletal system and connective tissue: Secondary | ICD-10-CM | POA: Insufficient documentation

## 2013-02-19 DIAGNOSIS — Z79899 Other long term (current) drug therapy: Secondary | ICD-10-CM | POA: Insufficient documentation

## 2013-02-19 DIAGNOSIS — Z8719 Personal history of other diseases of the digestive system: Secondary | ICD-10-CM | POA: Insufficient documentation

## 2013-02-19 MED ORDER — FENTANYL CITRATE 0.05 MG/ML IJ SOLN
50.0000 ug | Freq: Once | INTRAMUSCULAR | Status: AC
  Administered 2013-02-19: 50 ug via INTRAMUSCULAR
  Filled 2013-02-19: qty 2

## 2013-02-19 NOTE — ED Notes (Signed)
Pt reports hx of sciatica ahd has an appt on Monday to see ortho md but pain is 10/10. Pt has shooting throbbing pain from left hip to bottom of left foot.  Pt states she has taken rx pain meds (lortab) without relief. Pt denies injury.  No obvious deformity.  Pt alert oriented X4

## 2013-02-19 NOTE — ED Notes (Signed)
Reports having arthritis pain to left leg and hip, reports having a flare up and no relief with her pain meds at home.

## 2013-02-19 NOTE — ED Provider Notes (Signed)
Medical screening examination/treatment/procedure(s) were performed by non-physician practitioner and as supervising physician I was immediately available for consultation/collaboration.   Nannie Starzyk, MD 02/19/13 2350 

## 2013-02-19 NOTE — ED Provider Notes (Signed)
History    This chart was scribed for non-physician practitioner Dierdre Forth, PA-C working with Loren Racer, MD by Gerlean Ren, ED Scribe. This patient was seen in room TR07C/TR07C and the patient's care was started at 3:26 PM.    CSN: [id number]  Arrival date & time 02/19/13  1310   First MD Initiated Contact with Patient 02/19/13 1509      Chief Complaint  Patient presents with  . Leg Pain     The history is provided by the patient. No language interpreter was used.  [name] [name] is a 62 y.o. female who presents to the Emergency Department complaining of flare ups of chronic sharp left hip pain radiating into left leg to foot with associated tingling throughout entire leg. Pt has been seen for this in the ED many times in the past. Flare up began evening of 03/30, not improved by ice or Lortab.  Pt reports she has an appointment on 04/07 with her orthopedist "to fix it."  Pt was not performing any unusual activities when flare up began.  Pain worsened by ambulation and most activities involving movement.  Lying on right side improves pain. Pt denies, fever, chills, nausea, vomiting, diarrhea, weakness, numbness, loss of bowel or bladder function, dysuria, hematuria, saddle anesthesia.  H/o HTN, DM, anxiety, bipolar disorder.  Pt is a former smoker and denies alcohol use.  No IV drug use.     Past Medical History  Diagnosis Date  . Hypertension   . Diabetes mellitus   . Bipolar 1 disorder   . Heart murmur   . Sciatica   . Arthritis   . Hepatitis     hep c ?, pt states thinks  . Anxiety   . Diverticulosis   . Chronic low back pain   . DDD (degenerative disc disease), lumbar   . Chronic shoulder pain   . Chronic neck pain     Past Surgical History  Procedure Laterality Date  . Facial tumor removal    . Brain tumor removal    . Brain surgery      in [place]  . Video bronchoscopy  12/04/2012    Procedure: VIDEO BRONCHOSCOPY;  Surgeon: Delight Ovens, MD;   Location: [facility] OR;  Service: Thoracic;  Laterality: N/A;  . Video assisted thoracoscopy (vats)/wedge resection  12/04/2012    Procedure: VIDEO ASSISTED THORACOSCOPY (VATS)/WEDGE RESECTION;  Surgeon: Delight Ovens, MD;  Location: [facility] OR;  Service: Thoracic;  Laterality: Right;  . Lobectomy  12/04/2012    Procedure: LOBECTOMY;  Surgeon: Delight Ovens, MD;  Location: [facility] OR;  Service: Thoracic;  Laterality: Right;  completion of right upper lobectomy and lymph node disection, placement of on q pump    Family History  Problem Relation Age of Onset  . Hypertension Father     deceased  . Diabetes Father   . Heart disease Father   . Hyperlipidemia Father   . Hypertension Mother   . Hyperlipidemia Mother   . Diabetes Mother   . Hypertension Sister   . Hyperlipidemia Sister   . Hypertension Brother   . Hyperlipidemia Brother     History  Substance Use Topics  . Smoking status: Current Some Day Smoker -- 10 years    Types: Cigarettes  . Smokeless tobacco: Never Used     Comment: Smokes 1/2 pack per week  . Alcohol Use: No     Comment: quit 54yrs ago    No OB history provided.  Review of Systems  Constitutional: Negative for fever, diaphoresis, appetite change, fatigue and unexpected weight change.  HENT: Negative for mouth sores and neck stiffness.   Eyes: Negative for visual disturbance.  Respiratory: Negative for cough, chest tightness, shortness of breath and wheezing.   Cardiovascular: Negative for chest pain.  Gastrointestinal: Negative for nausea, vomiting, abdominal pain, diarrhea and constipation.  Endocrine: Negative for polydipsia, polyphagia and polyuria.  Genitourinary: Negative for dysuria, urgency, frequency and hematuria.  Musculoskeletal: Positive for arthralgias (left hip). Negative for back pain.  Skin: Negative for rash.  Allergic/Immunologic: Negative for immunocompromised state.  Neurological: Negative for syncope, light-headedness and headaches.   Hematological: Does not bruise/bleed easily.  Psychiatric/Behavioral: Negative for sleep disturbance. The patient is not nervous/anxious.     Allergies  Haldol; Morphine and related; and Penicillins  Home Medications   Current Outpatient Rx  Name  Route  Sig  Dispense  Refill  . albuterol (PROVENTIL HFA;VENTOLIN HFA) 108 (90 BASE) MCG/ACT inhaler   Inhalation   Inhale 2 puffs into the lungs every 6 (six) hours as needed (for shortness of breath). For shortness of breath         . ALPRAZolam (XANAX) 1 MG tablet   Oral   Take 0.5 mg by mouth 2 (two) times daily as needed (for sleep or anxiety). For sleep and anxiety         . aspirin EC 81 MG tablet   Oral   Take 81 mg by mouth daily.         Marland Kitchen dexlansoprazole (DEXILANT) 60 MG capsule   Oral   Take 60 mg by mouth every morning.          . dicyclomine (BENTYL) 20 MG tablet   Oral   Take 1 tablet (20 mg total) by mouth 2 (two) times daily.   20 tablet   0   . ergocalciferol (VITAMIN D2) 50000 UNITS capsule   Oral   Take 50,000 Units by mouth every Monday.         . gabapentin (NEURONTIN) 400 MG capsule   Oral   Take 400 mg by mouth 3 (three) times daily.          . hydrochlorothiazide (HYDRODIURIL) 25 MG tablet   Oral   Take 25 mg by mouth daily.         Marland Kitchen HYDROcodone-acetaminophen (NORCO/VICODIN) 5-325 MG per tablet   Oral   Take 1-2 tablets by mouth every 6 (six) hours as needed for pain (for pain).         Marland Kitchen loratadine (CLARITIN) 10 MG tablet   Oral   Take 10 mg by mouth every morning.          . metFORMIN (GLUCOPHAGE) 500 MG tablet   Oral   Take 500 mg by mouth 2 (two) times daily with a meal.          . metoCLOPramide (REGLAN) 5 MG tablet   Oral   Take 1 tablet (5 mg total) by mouth 4 (four) times daily -  before meals and at bedtime.   120 tablet   2   . metoprolol tartrate (LOPRESSOR) 25 MG tablet   Oral   Take 12.5 mg by mouth 2 (two) times daily.         . mirtazapine  (REMERON) 15 MG tablet   Oral   Take 15 mg by mouth at bedtime.         Marland Kitchen olmesartan (BENICAR) 40 MG tablet  Oral   Take 40 mg by mouth daily.         . ondansetron (ZOFRAN) 4 MG tablet   Oral   Take 4 mg by mouth every 6 (six) hours as needed (for nausea or vomiting).         . polyethylene glycol (MIRALAX / GLYCOLAX) packet   Oral   Take 17 g by mouth daily.   14 each   0   . simvastatin (ZOCOR) 20 MG tablet   Oral   Take 20 mg by mouth every evening.           BP 152/95  Pulse 114  Temp(Src) 99.3 F (37.4 C) (Oral)  Resp 18  SpO2 92%  Physical Exam  Nursing note and vitals reviewed. Constitutional: She appears well-developed and well-nourished. No distress.  HENT:  Head: Normocephalic and atraumatic.  Mouth/Throat: Oropharynx is clear and moist. No oropharyngeal exudate.  Eyes: Conjunctivae are normal.  Neck: Normal range of motion. Neck supple.  Full ROM without pain  Cardiovascular: Normal rate, regular rhythm and intact distal pulses.   Pulmonary/Chest: Effort normal and breath sounds normal. No respiratory distress. She has no wheezes.  Abdominal: Soft. She exhibits no distension. There is no tenderness.  Musculoskeletal: Normal range of motion. She exhibits no edema.  Full range of motion of the T-spine and L-spine No tenderness to palpation of the spinous processes of the T-spine or L-spine, no midline tenderness Reproducible radiating pain with palpation of the L buttocks   Lymphadenopathy:    She has no cervical adenopathy.  Neurological: She is alert. She has normal reflexes.  Speech is clear and goal oriented, follows commands Normal strength lower extremities bilaterally including dorsiflexion and plantar flexion Sensation normal to light and sharp touch Moves extremities without ataxia, coordination intact Normal gait Normal balance   Skin: Skin is warm and dry. No rash noted. She is not diaphoretic. No erythema.  Psychiatric: She has  a normal mood and affect.    ED Course  Procedures (including critical care time) DIAGNOSTIC STUDIES: Oxygen Saturation is 92% on room air, adequate by my interpretation.    COORDINATION OF CARE: 3:33 PM- Informed pt that I will provide her with IM pain medication here but that I will not provide her with narcotic prescription and that she needs to follow-up with pain management.  Pt verbalizes understanding.   1. Sciatica, left [724.3]       MDM  [name] [name] presents with acute on chronic back pain.  Pain is reproducible in the same distribution as her previous bouts of sciatica.  No neurological deficits and normal neuro exam. Pt tachycardic on vital signs, but not on my exam. Patient can walk but states is painful.  No loss of bowel or bladder control.  No concern for cauda equina.  No fever, night sweats, weight loss, h/o cancer, IVDU. Pain medication given in the ED, and conservative therapies discussed.  Pt with pain medication at home and f/u appointment with her orthopedist next week.  I have also discussed reasons to return immediately to the ER.  Patient expresses understanding and agrees with plan.   I personally performed the services described in this documentation, which was scribed in my presence. The recorded information has been reviewed and is accurate.   Dahlia Client Emeterio Balke, PA-C 02/19/13 [phone]

## 2013-03-01 ENCOUNTER — Emergency Department (HOSPITAL_COMMUNITY)
Admission: EM | Admit: 2013-03-01 | Discharge: 2013-03-01 | Disposition: A | Discharge status: Home / Self Care | Payer: Medicaid Other | Attending: Emergency Medicine | Admitting: Emergency Medicine

## 2013-03-01 ENCOUNTER — Encounter (HOSPITAL_COMMUNITY): Payer: Self-pay | Admitting: *Deleted

## 2013-03-01 DIAGNOSIS — M5432 Sciatica, left side: Secondary | ICD-10-CM

## 2013-03-01 DIAGNOSIS — F319 Bipolar disorder, unspecified: Secondary | ICD-10-CM | POA: Insufficient documentation

## 2013-03-01 DIAGNOSIS — F411 Generalized anxiety disorder: Secondary | ICD-10-CM | POA: Insufficient documentation

## 2013-03-01 DIAGNOSIS — E119 Type 2 diabetes mellitus without complications: Secondary | ICD-10-CM | POA: Insufficient documentation

## 2013-03-01 DIAGNOSIS — R011 Cardiac murmur, unspecified: Secondary | ICD-10-CM | POA: Insufficient documentation

## 2013-03-01 DIAGNOSIS — M25519 Pain in unspecified shoulder: Secondary | ICD-10-CM | POA: Insufficient documentation

## 2013-03-01 DIAGNOSIS — M479 Spondylosis, unspecified: Secondary | ICD-10-CM

## 2013-03-01 DIAGNOSIS — M519 Unspecified thoracic, thoracolumbar and lumbosacral intervertebral disc disorder: Secondary | ICD-10-CM | POA: Insufficient documentation

## 2013-03-01 DIAGNOSIS — M5430 Sciatica, unspecified side: Secondary | ICD-10-CM | POA: Insufficient documentation

## 2013-03-01 DIAGNOSIS — K5730 Diverticulosis of large intestine without perforation or abscess without bleeding: Secondary | ICD-10-CM | POA: Insufficient documentation

## 2013-03-01 DIAGNOSIS — I10 Essential (primary) hypertension: Secondary | ICD-10-CM | POA: Insufficient documentation

## 2013-03-01 DIAGNOSIS — M1990 Unspecified osteoarthritis, unspecified site: Secondary | ICD-10-CM | POA: Insufficient documentation

## 2013-03-01 DIAGNOSIS — M542 Cervicalgia: Secondary | ICD-10-CM | POA: Insufficient documentation

## 2013-03-01 DIAGNOSIS — G8929 Other chronic pain: Secondary | ICD-10-CM | POA: Insufficient documentation

## 2013-03-01 DIAGNOSIS — Z7982 Long term (current) use of aspirin: Secondary | ICD-10-CM | POA: Insufficient documentation

## 2013-03-01 DIAGNOSIS — Z8619 Personal history of other infectious and parasitic diseases: Secondary | ICD-10-CM | POA: Insufficient documentation

## 2013-03-01 DIAGNOSIS — F17200 Nicotine dependence, unspecified, uncomplicated: Secondary | ICD-10-CM | POA: Insufficient documentation

## 2013-03-01 DIAGNOSIS — Z79899 Other long term (current) drug therapy: Secondary | ICD-10-CM | POA: Insufficient documentation

## 2013-03-01 MED ORDER — IBUPROFEN 400 MG PO TABS
600.0000 mg | ORAL_TABLET | Freq: Once | ORAL | Status: AC
  Administered 2013-03-01: 600 mg via ORAL
  Filled 2013-03-01: qty 1

## 2013-03-01 MED ORDER — METHOCARBAMOL 500 MG PO TABS: 500.0000 mg | ORAL_TABLET | Freq: Two times a day (BID) | ORAL | Status: DC

## 2013-03-01 MED ORDER — IBUPROFEN 400 MG PO TABS: 400.0000 mg | ORAL_TABLET | Freq: Four times a day (QID) | ORAL | Status: DC | PRN

## 2013-03-01 MED ORDER — HYDROMORPHONE HCL PF 2 MG/ML IJ SOLN
2.0000 mg | Freq: Once | INTRAMUSCULAR | Status: AC
  Administered 2013-03-01: 2 mg via INTRAMUSCULAR
  Filled 2013-03-01: qty 1

## 2013-03-01 MED ORDER — HYDROCODONE-ACETAMINOPHEN 5-325 MG PO TABS: 1.0000 | ORAL_TABLET | Freq: Four times a day (QID) | ORAL | Status: DC | PRN

## 2013-03-01 MED ORDER — DIAZEPAM 2 MG PO TABS
2.0000 mg | ORAL_TABLET | Freq: Once | ORAL | Status: AC
  Administered 2013-03-01: 2 mg via ORAL
  Filled 2013-03-01: qty 1

## 2013-03-01 NOTE — ED Notes (Signed)
Reports pain to left hip and radiates down her leg, hx of same.

## 2013-03-01 NOTE — ED Provider Notes (Addendum)
History     CSN: [id number]  Arrival date & time 03/01/13  1642   First MD Initiated Contact with Patient 03/01/13 1645      Chief Complaint  Patient presents with  . Hip Pain    (Consider location/radiation/quality/duration/timing/severity/associated sxs/prior treatment) HPI Comments: Pt comes in with cc of hip pain. PMHx of HTN, DM. Pt has known sciatica, DJD of the back and has an ortho f.u on Monday. She reports that starting yday, her back pain has flared up. Pt has no precipitating factor. She has been taking lortabs, with minimal relief. The pain starts in the back, and radiates down the buttock on the left side. With the pain there is some numbness and tingling. Her pain and numbness has spread a bit more. No associated numbness, weakness, urinary incontinence, urinary retention, bowel incontinence, saddle anesthesia and pt has been ambulating.   [name] a 61 y.o. female presenting with hip pain. The history is provided by the patient.  Hip Pain Pertinent negatives include no chest pain, no abdominal pain, no headaches and no shortness of breath.    Past Medical History  Diagnosis Date  . Hypertension   . Diabetes mellitus   . Bipolar 1 disorder   . Heart murmur   . Sciatica   . Arthritis   . Hepatitis     hep c ?, pt states thinks  . Anxiety   . Diverticulosis   . Chronic low back pain   . DDD (degenerative disc disease), lumbar   . Chronic shoulder pain   . Chronic neck pain     Past Surgical History  Procedure Laterality Date  . Facial tumor removal    . Brain tumor removal    . Brain surgery      in [place]  . Video bronchoscopy  12/04/2012    Procedure: VIDEO BRONCHOSCOPY;  Surgeon: Delight Ovens, MD;  Location: [facility] OR;  Service: Thoracic;  Laterality: N/A;  . Video assisted thoracoscopy (vats)/wedge resection  12/04/2012    Procedure: VIDEO ASSISTED THORACOSCOPY (VATS)/WEDGE RESECTION;  Surgeon: Delight Ovens, MD;  Location: [facility] OR;   Service: Thoracic;  Laterality: Right;  . Lobectomy  12/04/2012    Procedure: LOBECTOMY;  Surgeon: Delight Ovens, MD;  Location: [facility] OR;  Service: Thoracic;  Laterality: Right;  completion of right upper lobectomy and lymph node disection, placement of on q pump    Family History  Problem Relation Age of Onset  . Hypertension Father     deceased  . Diabetes Father   . Heart disease Father   . Hyperlipidemia Father   . Hypertension Mother   . Hyperlipidemia Mother   . Diabetes Mother   . Hypertension Sister   . Hyperlipidemia Sister   . Hypertension Brother   . Hyperlipidemia Brother     History  Substance Use Topics  . Smoking status: Current Some Day Smoker -- 10 years    Types: Cigarettes  . Smokeless tobacco: Never Used     Comment: Smokes 1/2 pack per week  . Alcohol Use: No     Comment: quit 21yrs ago    OB History   Grav Para Term Preterm Abortions TAB SAB Ect Mult Living                  Review of Systems  Constitutional: Negative for activity change.  HENT: Negative for neck pain.   Respiratory: Negative for shortness of breath.   Cardiovascular: Negative for chest  pain.  Gastrointestinal: Negative for nausea, vomiting and abdominal pain.  Genitourinary: Negative for dysuria.  Musculoskeletal: Positive for back pain.  Neurological: Negative for headaches.    Allergies  Haldol; Morphine and related; and Penicillins  Home Medications   Current Outpatient Rx  Name  Route  Sig  Dispense  Refill  . albuterol (PROVENTIL HFA;VENTOLIN HFA) 108 (90 BASE) MCG/ACT inhaler   Inhalation   Inhale 2 puffs into the lungs every 6 (six) hours as needed (for shortness of breath). For shortness of breath         . ALPRAZolam (XANAX) 1 MG tablet   Oral   Take 0.5 mg by mouth 2 (two) times daily as needed (for sleep or anxiety). For sleep and anxiety         . aspirin EC 81 MG tablet   Oral   Take 81 mg by mouth daily.         Marland Kitchen dexlansoprazole (DEXILANT)  60 MG capsule   Oral   Take 60 mg by mouth every morning.          . dicyclomine (BENTYL) 20 MG tablet   Oral   Take 1 tablet (20 mg total) by mouth 2 (two) times daily.   20 tablet   0   . ergocalciferol (VITAMIN D2) 50000 UNITS capsule   Oral   Take 50,000 Units by mouth every Monday.         . gabapentin (NEURONTIN) 400 MG capsule   Oral   Take 400 mg by mouth 3 (three) times daily.          . hydrochlorothiazide (HYDRODIURIL) 25 MG tablet   Oral   Take 25 mg by mouth daily.         Marland Kitchen HYDROcodone-acetaminophen (NORCO/VICODIN) 5-325 MG per tablet   Oral   Take 1-2 tablets by mouth every 6 (six) hours as needed for pain (for pain).         Marland Kitchen loratadine (CLARITIN) 10 MG tablet   Oral   Take 10 mg by mouth every morning.          . metFORMIN (GLUCOPHAGE) 500 MG tablet   Oral   Take 500 mg by mouth 2 (two) times daily with a meal.          . metoCLOPramide (REGLAN) 5 MG tablet   Oral   Take 1 tablet (5 mg total) by mouth 4 (four) times daily -  before meals and at bedtime.   120 tablet   2   . metoprolol tartrate (LOPRESSOR) 25 MG tablet   Oral   Take 12.5 mg by mouth 2 (two) times daily.         . mirtazapine (REMERON) 15 MG tablet   Oral   Take 15 mg by mouth at bedtime.         Marland Kitchen olmesartan (BENICAR) 40 MG tablet   Oral   Take 40 mg by mouth daily.         . ondansetron (ZOFRAN) 4 MG tablet   Oral   Take 4 mg by mouth every 6 (six) hours as needed (for nausea or vomiting).         Marland Kitchen oxyCODONE-acetaminophen (PERCOCET/ROXICET) 5-325 MG per tablet   Oral   Take 1 tablet by mouth every 4 (four) hours as needed for pain.         . polyethylene glycol (MIRALAX / GLYCOLAX) packet   Oral   Take 17 g by mouth  daily.   14 each   0   . simvastatin (ZOCOR) 20 MG tablet   Oral   Take 20 mg by mouth every evening.           BP 171/90  Pulse 109  Temp(Src) 98.3 F (36.8 C) (Oral)  Resp 20  SpO2 100%  Physical Exam  Nursing  note and vitals reviewed. Constitutional: She is oriented to person, place, and time. She appears well-developed and well-nourished.  HENT:  Head: Normocephalic and atraumatic.  Eyes: EOM are normal. Pupils are equal, round, and reactive to light.  Neck: Neck supple.  Cardiovascular: Normal rate, regular rhythm and normal heart sounds.   No murmur heard. Pulmonary/Chest: Effort normal. No respiratory distress.  Abdominal: Soft. She exhibits no distension. There is no tenderness. There is no rebound and no guarding.  Musculoskeletal:  Pt has tenderness over the lumbar region No step offs, no erythema. Pt has 1+ patellar reflex bilaterally. Able to discriminate between sharp and dull - but there is subjective numbness on the left side of her thigh, buttock and legs. Rectal tone is intact Able to ambulate  Neurological: She is alert and oriented to person, place, and time.  Skin: Skin is warm and dry.    ED Course  Procedures (including critical care time)  Labs Reviewed - No data to display No results found.   No diagnosis found.    MDM  DDx includes: - DJD of the back - Spondylitises/ spondylosis - Sciatica - Spinal cord compression - Conus medullaris - Epidural hematoma - Epidural abscess - Lytic/pathologic fracture - Myelitis - Musculoskeletal pain  Pt comes in with back pain with radiation to the left hip and some numbness, tingling. The objective neuro exam is reassuring, and we have low to no concerns for cord compression secondary to disk slippage at this time. We will try to control pain. We will defer care to Ortho appt on Monday, for possible imaging including MRI. Might give her a burst dose of steroids.  Derwood Kaplan, MD 03/01/13 1737  6:38 PM Pain improved markedly and patient satisfied. Will d.c now  Derwood Kaplan, MD 03/01/13 1840

## 2013-03-01 NOTE — ED Notes (Signed)
Pt states she has hip pain, when asked to describe where pain is located pt points to her left buttock.  States she takes Lortab 3 times a day for the pain but it is not working today.  Pain radiates down left leg.  Able to bear weight and move extremity well.  Pedal pulse strong.

## 2013-03-07 ENCOUNTER — Encounter (HOSPITAL_COMMUNITY): Payer: Self-pay

## 2013-03-07 ENCOUNTER — Emergency Department (HOSPITAL_COMMUNITY)
Admission: EM | Admit: 2013-03-07 | Discharge: 2013-03-07 | Disposition: A | Discharge status: Home / Self Care | Payer: Medicaid Other | Attending: Emergency Medicine | Admitting: Emergency Medicine

## 2013-03-07 ENCOUNTER — Encounter (HOSPITAL_COMMUNITY): Payer: Self-pay | Admitting: Emergency Medicine

## 2013-03-07 DIAGNOSIS — R112 Nausea with vomiting, unspecified: Secondary | ICD-10-CM | POA: Insufficient documentation

## 2013-03-07 DIAGNOSIS — Z7982 Long term (current) use of aspirin: Secondary | ICD-10-CM | POA: Insufficient documentation

## 2013-03-07 DIAGNOSIS — Z794 Long term (current) use of insulin: Secondary | ICD-10-CM

## 2013-03-07 DIAGNOSIS — G40909 Epilepsy, unspecified, not intractable, without status epilepticus: Secondary | ICD-10-CM | POA: Diagnosis present

## 2013-03-07 DIAGNOSIS — I10 Essential (primary) hypertension: Secondary | ICD-10-CM | POA: Insufficient documentation

## 2013-03-07 DIAGNOSIS — G8929 Other chronic pain: Secondary | ICD-10-CM | POA: Diagnosis present

## 2013-03-07 DIAGNOSIS — F319 Bipolar disorder, unspecified: Secondary | ICD-10-CM | POA: Diagnosis present

## 2013-03-07 DIAGNOSIS — D72829 Elevated white blood cell count, unspecified: Secondary | ICD-10-CM | POA: Diagnosis present

## 2013-03-07 DIAGNOSIS — M545 Low back pain, unspecified: Secondary | ICD-10-CM | POA: Insufficient documentation

## 2013-03-07 DIAGNOSIS — F411 Generalized anxiety disorder: Secondary | ICD-10-CM | POA: Diagnosis present

## 2013-03-07 DIAGNOSIS — M25519 Pain in unspecified shoulder: Secondary | ICD-10-CM | POA: Insufficient documentation

## 2013-03-07 DIAGNOSIS — Z8619 Personal history of other infectious and parasitic diseases: Secondary | ICD-10-CM | POA: Insufficient documentation

## 2013-03-07 DIAGNOSIS — E119 Type 2 diabetes mellitus without complications: Secondary | ICD-10-CM | POA: Insufficient documentation

## 2013-03-07 DIAGNOSIS — M129 Arthropathy, unspecified: Secondary | ICD-10-CM | POA: Insufficient documentation

## 2013-03-07 DIAGNOSIS — E860 Dehydration: Secondary | ICD-10-CM | POA: Diagnosis present

## 2013-03-07 DIAGNOSIS — M51379 Other intervertebral disc degeneration, lumbosacral region without mention of lumbar back pain or lower extremity pain: Secondary | ICD-10-CM | POA: Diagnosis present

## 2013-03-07 DIAGNOSIS — M5137 Other intervertebral disc degeneration, lumbosacral region: Secondary | ICD-10-CM | POA: Diagnosis present

## 2013-03-07 DIAGNOSIS — B1910 Unspecified viral hepatitis B without hepatic coma: Secondary | ICD-10-CM | POA: Diagnosis present

## 2013-03-07 DIAGNOSIS — R109 Unspecified abdominal pain: Secondary | ICD-10-CM | POA: Insufficient documentation

## 2013-03-07 DIAGNOSIS — I498 Other specified cardiac arrhythmias: Secondary | ICD-10-CM | POA: Diagnosis present

## 2013-03-07 DIAGNOSIS — F17200 Nicotine dependence, unspecified, uncomplicated: Secondary | ICD-10-CM | POA: Diagnosis present

## 2013-03-07 DIAGNOSIS — K8689 Other specified diseases of pancreas: Secondary | ICD-10-CM | POA: Diagnosis present

## 2013-03-07 DIAGNOSIS — R011 Cardiac murmur, unspecified: Secondary | ICD-10-CM | POA: Insufficient documentation

## 2013-03-07 DIAGNOSIS — R197 Diarrhea, unspecified: Secondary | ICD-10-CM | POA: Insufficient documentation

## 2013-03-07 DIAGNOSIS — Z79899 Other long term (current) drug therapy: Secondary | ICD-10-CM | POA: Insufficient documentation

## 2013-03-07 DIAGNOSIS — B1920 Unspecified viral hepatitis C without hepatic coma: Secondary | ICD-10-CM | POA: Diagnosis present

## 2013-03-07 DIAGNOSIS — Z8739 Personal history of other diseases of the musculoskeletal system and connective tissue: Secondary | ICD-10-CM | POA: Insufficient documentation

## 2013-03-07 DIAGNOSIS — Z85118 Personal history of other malignant neoplasm of bronchus and lung: Secondary | ICD-10-CM

## 2013-03-07 DIAGNOSIS — A0472 Enterocolitis due to Clostridium difficile, not specified as recurrent: Principal | ICD-10-CM | POA: Diagnosis present

## 2013-03-07 DIAGNOSIS — Z8719 Personal history of other diseases of the digestive system: Secondary | ICD-10-CM | POA: Insufficient documentation

## 2013-03-07 DIAGNOSIS — M542 Cervicalgia: Secondary | ICD-10-CM | POA: Insufficient documentation

## 2013-03-07 DIAGNOSIS — F1910 Other psychoactive substance abuse, uncomplicated: Secondary | ICD-10-CM | POA: Diagnosis present

## 2013-03-07 LAB — CBC WITH DIFFERENTIAL/PLATELET
Basophils Absolute: 0 10*3/uL (ref 0.0–0.1)
Basophils Relative: 0 % (ref 0–1)
Eosinophils Absolute: 0 10*3/uL (ref 0.0–0.7)
MCH: 28.4 pg (ref 26.0–34.0)
MCHC: 35.4 g/dL (ref 30.0–36.0)
Neutrophils Relative %: 83 % — ABNORMAL HIGH (ref 43–77)
Platelets: 319 10*3/uL (ref 150–400)
RDW: 13.1 % (ref 11.5–15.5)

## 2013-03-07 LAB — COMPREHENSIVE METABOLIC PANEL
AST: 30 U/L (ref 0–37)
Albumin: 4.9 g/dL (ref 3.5–5.2)
Alkaline Phosphatase: 89 U/L (ref 39–117)
BUN: 16 mg/dL (ref 6–23)
Potassium: 3.2 mEq/L — ABNORMAL LOW (ref 3.5–5.1)
Sodium: 139 mEq/L (ref 135–145)
Total Protein: 8.7 g/dL — ABNORMAL HIGH (ref 6.0–8.3)

## 2013-03-07 LAB — RAPID URINE DRUG SCREEN, HOSP PERFORMED
Barbiturates: NOT DETECTED
Benzodiazepines: POSITIVE — AB
Cocaine: NOT DETECTED

## 2013-03-07 LAB — URINALYSIS, ROUTINE W REFLEX MICROSCOPIC
Leukocytes, UA: NEGATIVE
Nitrite: NEGATIVE
Specific Gravity, Urine: >1.03 — ABNORMAL HIGH (ref 1.005–1.030)
pH: 5.5 (ref 5.0–8.0)

## 2013-03-07 LAB — POCT I-STAT TROPONIN I: Troponin i, poc: 0.01 ng/mL (ref 0.00–0.08)

## 2013-03-07 LAB — URINE MICROSCOPIC-ADD ON

## 2013-03-07 LAB — LIPASE, BLOOD: Lipase: 14 U/L (ref 11–59)

## 2013-03-07 MED ORDER — HYDROMORPHONE HCL PF 1 MG/ML IJ SOLN
1.0000 mg | Freq: Once | INTRAMUSCULAR | Status: AC
  Administered 2013-03-07: 1 mg via INTRAMUSCULAR
  Filled 2013-03-07: qty 1

## 2013-03-07 MED ORDER — POTASSIUM CHLORIDE CRYS ER 20 MEQ PO TBCR
30.0000 meq | EXTENDED_RELEASE_TABLET | Freq: Once | ORAL | Status: AC
  Administered 2013-03-07: 30 meq via ORAL
  Filled 2013-03-07: qty 2

## 2013-03-07 MED ORDER — IOHEXOL 300 MG/ML  SOLN
25.0000 mL | INTRAMUSCULAR | Status: AC
  Administered 2013-03-07 (×2): 25 mL via ORAL

## 2013-03-07 MED ORDER — HYDROMORPHONE HCL PF 1 MG/ML IJ SOLN: 1.0000 mg | Freq: Once | INTRAMUSCULAR | Status: DC

## 2013-03-07 MED ORDER — ONDANSETRON HCL 4 MG PO TABS: 4.0000 mg | ORAL_TABLET | Freq: Four times a day (QID) | ORAL | Status: DC

## 2013-03-07 MED ORDER — HYDROMORPHONE HCL PF 1 MG/ML IJ SOLN
1.0000 mg | Freq: Once | INTRAMUSCULAR | Status: AC
  Administered 2013-03-07: 1 mg via INTRAVENOUS
  Filled 2013-03-07: qty 1

## 2013-03-07 MED ORDER — SODIUM CHLORIDE 0.9 % IV BOLUS (SEPSIS)
1000.0000 mL | Freq: Once | INTRAVENOUS | Status: AC
  Administered 2013-03-07: 1000 mL via INTRAVENOUS

## 2013-03-07 MED ORDER — SODIUM CHLORIDE 0.9 % IV BOLUS (SEPSIS)
500.0000 mL | Freq: Once | INTRAVENOUS | Status: AC
  Administered 2013-03-07: 500 mL via INTRAVENOUS

## 2013-03-07 MED ORDER — ONDANSETRON 4 MG PO TBDP
8.0000 mg | ORAL_TABLET | Freq: Once | ORAL | Status: AC
  Administered 2013-03-07: 8 mg via ORAL
  Filled 2013-03-07: qty 2

## 2013-03-07 MED ORDER — ONDANSETRON HCL 4 MG/2ML IJ SOLN: 4.0000 mg | Freq: Once | INTRAMUSCULAR | Status: DC

## 2013-03-07 NOTE — ED Notes (Signed)
Pt reports N/V since this AM. Pt reports abdominal cramping. AO x 4.

## 2013-03-07 NOTE — ED Notes (Signed)
Two nurses attempted IV. IV team paged. Greta Doom PA made aware. Pt remains very restless. Pt on monitor. HR 130's ST

## 2013-03-07 NOTE — ED Provider Notes (Signed)
Medical screening examination/treatment/procedure(s) were conducted as a shared visit with non-physician practitioner(s) and myself.  I personally evaluated the patient during the encounter    Nelia Shi, MD 03/07/13 Ernestina [place]

## 2013-03-07 NOTE — ED Notes (Signed)
Pt was seen here earlier this pm for abd cramping and vomiting.  St's after she got home the cramps and nausea returned.

## 2013-03-07 NOTE — ED Notes (Signed)
Pt given ginger ale and food. Denies nausea at this time. Pt states that she feels much better and pain has decreased.

## 2013-03-07 NOTE — ED Notes (Signed)
IV team unsuccessful with IV start. PA Greta Doom made aware. Orders to follow.

## 2013-03-07 NOTE — ED Provider Notes (Signed)
History     CSN: [id number]  Arrival date & time 03/07/13  1214   First MD Initiated Contact with Patient 03/07/13 1229      Chief Complaint  Patient presents with  . Nausea  . Emesis    (Consider location/radiation/quality/duration/timing/severity/associated sxs/prior treatment) HPI  62 year old female with history of hypertension, diabetes, and bipolar presents complaining of abdominal pain with associate nausea and vomiting. [name] Describe pain as a cramping sensation, persistent, with associate nausea and vomiting. She has had 3 bouts of nonbloody nonbilious vomitus. She also endorsed some mild loose stool without blood or mucus this a.m. Pain is currently a 10 out of 10, nothing makes it better or worse. No specific treatment tried. She denies fever, chills, chest pain, shortness of breath, cough, back pain, dysuria, hematochezia, or melena. No prior history of abdominal surgery.  Past Medical History  Diagnosis Date  . Hypertension   . Diabetes mellitus   . Bipolar 1 disorder   . Heart murmur   . Sciatica   . Arthritis   . Hepatitis     hep c ?, pt states thinks  . Anxiety   . Diverticulosis   . Chronic low back pain   . DDD (degenerative disc disease), lumbar   . Chronic shoulder pain   . Chronic neck pain     Past Surgical History  Procedure Laterality Date  . Facial tumor removal    . Brain tumor removal    . Brain surgery      in [place]  . Video bronchoscopy  12/04/2012    Procedure: VIDEO BRONCHOSCOPY;  Surgeon: Delight Ovens, MD;  Location: [facility] OR;  Service: Thoracic;  Laterality: N/A;  . Video assisted thoracoscopy (vats)/wedge resection  12/04/2012    Procedure: VIDEO ASSISTED THORACOSCOPY (VATS)/WEDGE RESECTION;  Surgeon: Delight Ovens, MD;  Location: [facility] OR;  Service: Thoracic;  Laterality: Right;  . Lobectomy  12/04/2012    Procedure: LOBECTOMY;  Surgeon: Delight Ovens, MD;  Location: [facility] OR;  Service: Thoracic;  Laterality: Right;  completion of right upper lobectomy and lymph node disection, placement of on q pump    Family History  Problem Relation Age of Onset  . Hypertension Father     deceased  . Diabetes Father   . Heart disease Father   . Hyperlipidemia Father   . Hypertension Mother   . Hyperlipidemia Mother   . Diabetes Mother   . Hypertension Sister   . Hyperlipidemia Sister   . Hypertension Brother   . Hyperlipidemia Brother     History  Substance Use Topics  . Smoking status: Current Some Day Smoker -- 10 years    Types: Cigarettes  . Smokeless tobacco: Never Used     Comment: Smokes 1/2 pack per week  . Alcohol Use: No     Comment: quit 41yrs ago    OB History   Grav Para Term Preterm Abortions TAB SAB Ect Mult Living                  Review of Systems  Constitutional:       10 Systems reviewed and all are negative for acute change except as noted in the HPI.     Allergies  Haldol; Morphine and related; and Penicillins  Home Medications   Current Outpatient Rx  Name  Route  Sig  Dispense  Refill  . albuterol (PROVENTIL  HFA;VENTOLIN HFA) 108 (90 BASE) MCG/ACT inhaler   Inhalation   Inhale 2 puffs into the lungs every 6 (six) hours as needed (for shortness of breath). For shortness of breath         . ALPRAZolam (XANAX) 1 MG tablet   Oral   Take 0.5 mg by mouth 2 (two) times daily as needed (for sleep or anxiety). For sleep and anxiety         . aspirin EC 81 MG tablet   Oral   Take 81 mg by mouth daily.         Marland Kitchen dexlansoprazole (DEXILANT) 60 MG capsule   Oral   Take 60 mg by mouth every morning.          . dicyclomine (BENTYL) 20 MG tablet   Oral   Take 1 tablet (20 mg total) by mouth 2 (two) times daily.   20 tablet   0   . ergocalciferol (VITAMIN D2) 50000 UNITS capsule   Oral   Take 50,000 Units by mouth every Monday.         . gabapentin (NEURONTIN) 400 MG capsule   Oral    Take 400 mg by mouth 3 (three) times daily.          . hydrochlorothiazide (HYDRODIURIL) 25 MG tablet   Oral   Take 25 mg by mouth daily.         Marland Kitchen HYDROcodone-acetaminophen (NORCO/VICODIN) 5-325 MG per tablet   Oral   Take 1-2 tablets by mouth every 6 (six) hours as needed for pain (for pain).         Marland Kitchen HYDROcodone-acetaminophen (NORCO/VICODIN) 5-325 MG per tablet   Oral   Take 1 tablet by mouth every 6 (six) hours as needed for pain.   15 tablet   0   . ibuprofen (ADVIL,MOTRIN) 400 MG tablet   Oral   Take 1 tablet (400 mg total) by mouth every 6 (six) hours as needed for pain.   30 tablet   0   . loratadine (CLARITIN) 10 MG tablet   Oral   Take 10 mg by mouth every morning.          . metFORMIN (GLUCOPHAGE) 500 MG tablet   Oral   Take 500 mg by mouth 2 (two) times daily with a meal.          . methocarbamol (ROBAXIN) 500 MG tablet   Oral   Take 1 tablet (500 mg total) by mouth 2 (two) times daily.   20 tablet   0   . metoCLOPramide (REGLAN) 5 MG tablet   Oral   Take 1 tablet (5 mg total) by mouth 4 (four) times daily -  before meals and at bedtime.   120 tablet   2   . metoprolol tartrate (LOPRESSOR) 25 MG tablet   Oral   Take 12.5 mg by mouth 2 (two) times daily.         . mirtazapine (REMERON) 15 MG tablet   Oral   Take 15 mg by mouth at bedtime.         Marland Kitchen olmesartan (BENICAR) 40 MG tablet   Oral   Take 40 mg by mouth daily.         . ondansetron (ZOFRAN) 4 MG tablet   Oral   Take 4 mg by mouth every 6 (six) hours as needed (for nausea or vomiting).         Marland Kitchen oxyCODONE-acetaminophen (PERCOCET/ROXICET) 5-325 MG per tablet  Oral   Take 1 tablet by mouth every 4 (four) hours as needed for pain.         . polyethylene glycol (MIRALAX / GLYCOLAX) packet   Oral   Take 17 g by mouth daily.   14 each   0   . simvastatin (ZOCOR) 20 MG tablet   Oral   Take 20 mg by mouth every evening.           BP 181/96  Pulse 74   Temp(Src) 98.6 F (37 C) (Oral)  Resp 22  SpO2 97%  Physical Exam  Nursing note and vitals reviewed. Constitutional: She appears well-developed and well-nourished. No distress.  Awake, alert, nontoxic appearance  HENT:  Head: Atraumatic.  Eyes: Conjunctivae are normal. Right eye exhibits no discharge. Left eye exhibits no discharge.  Neck: Neck supple.  Cardiovascular: Normal rate and regular rhythm.   Pulmonary/Chest: Effort normal. No respiratory distress. She exhibits no tenderness.  Abdominal: Soft. Bowel sounds are normal. There is tenderness (Mild epigastric tenderness and suprapubic tenderness without guarding or rebound tenderness. No hernia noted.  negative Murphy's sign, no McBurney's point). There is no rebound.  Genitourinary:  No CVA tenderness  Musculoskeletal: She exhibits no edema and no tenderness.  ROM appears intact, no obvious focal weakness  Neurological:  Mental status and motor strength appears intact  Skin: No rash noted.  Psychiatric: She has a normal mood and affect.    ED Course  Procedures (including critical care time)   Date: 03/07/2013  Rate: 124  Rhythm: sinus tachycardia  QRS Axis: normal  Intervals: normal  ST/T Wave abnormalities: nonspecific ST/T changes  Conduction Disutrbances:none  Narrative Interpretation: normal ECG on May 10th, 2012  Old EKG Reviewed: changes noted    57:38 PM 62 year old female presents with abdominal pain with associate nausea vomiting and loose stools. She has nonfocal point tenderness. Due to the age will consider obtain abdominal CT scan for further evaluation. Pt has abd/pelvic CT scan in feb that was unremarkable.  Acute abdominal series in march without significant finding.  Is positive for opiates/benzos/THC from prior UDS.  Pain medication, IV fluid given.  Care discussed with attending. 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  2:10 PM Pt is a difficult stick, unable to  established an IV despite mult attempts by the IV team.  Will give pain medication via IM. When reviewing prior records, pt has presented with this same complaint in the past.  Since her CT was done 2 months ago and unremarkable, it is unlikely that she has acute pathology in her abdomen.  i have discussed this with my attending who felt CT scan is not indicative at this time.  Will focus on control pt's sxs.    2:51 PM IV line established by my attending using bedside US.  IVF given, pain medication given.  Will continue to monitor.    3:32 PM Patient felt much better after receiving treatment. She has normal white count, normal lipase, potassium is 3.2 we'll give supplementation. Once patient able to tolerates by mouth she'll be discharge for outpatient management. Patient was understanding and agrees with plan.  My attending is aware of plan.  Pt made aware that her BP is elevated and will need to have it recheck by her PCP.  Return precaution discussed.    Labs Reviewed  CBC WITH DIFFERENTIAL - Abnormal; Notable for the following:    RBC 5.22 (*)    Neutrophils Relative 83 (*)  Neutro Abs 8.5 (*)    All other components within normal limits  COMPREHENSIVE METABOLIC PANEL - Abnormal; Notable for the following:    Potassium 3.2 (*)    Glucose, Bld 164 (*)    Calcium 10.7 (*)    Total Protein 8.7 (*)    GFR calc non Af Amer 87 (*)    All other components within normal limits  URINALYSIS, ROUTINE W REFLEX MICROSCOPIC - Abnormal; Notable for the following:    APPearance HAZY (*)    Specific Gravity, Urine >1.030 (*)    Bilirubin Urine MODERATE (*)    Ketones, ur 15 (*)    Protein, ur 100 (*)    All other components within normal limits  URINE RAPID DRUG SCREEN (HOSP PERFORMED) - Abnormal; Notable for the following:    Opiates POSITIVE (*)    Benzodiazepines POSITIVE (*)    Tetrahydrocannabinol POSITIVE (*)    All other components within normal limits  URINE MICROSCOPIC-ADD ON -  Abnormal; Notable for the following:    Squamous Epithelial / LPF FEW (*)    Casts HYALINE CASTS (*)    All other components within normal limits  LIPASE, BLOOD  POCT I-STAT TROPONIN I   No results found.   1. Abdominal pain   2. Nausea & vomiting       MDM  BP 181/96  Pulse 110  Temp(Src) 98.6 F (37 C) (Oral)  Resp 22  SpO2 97%  I have reviewed nursing notes and vital signs. I personally reviewed the imaging tests through PACS system  I reviewed available ER/hospitalization records thought the EMR         Fayrene Helper, [place] 03/07/13 1602

## 2013-03-07 NOTE — ED Notes (Signed)
IV team at bedside 

## 2013-03-08 ENCOUNTER — Emergency Department (HOSPITAL_COMMUNITY): Payer: Medicaid Other

## 2013-03-08 ENCOUNTER — Encounter (HOSPITAL_COMMUNITY): Payer: Self-pay | Admitting: *Deleted

## 2013-03-08 ENCOUNTER — Inpatient Hospital Stay (HOSPITAL_COMMUNITY)
Admission: EM | Admit: 2013-03-08 | Discharge: 2013-03-12 | DRG: 372 | Disposition: A | Discharge status: Home / Self Care | Payer: Medicaid Other | Attending: Internal Medicine | Admitting: Internal Medicine

## 2013-03-08 DIAGNOSIS — R109 Unspecified abdominal pain: Secondary | ICD-10-CM

## 2013-03-08 DIAGNOSIS — R1013 Epigastric pain: Secondary | ICD-10-CM

## 2013-03-08 DIAGNOSIS — F319 Bipolar disorder, unspecified: Secondary | ICD-10-CM | POA: Diagnosis present

## 2013-03-08 DIAGNOSIS — Z9889 Other specified postprocedural states: Secondary | ICD-10-CM

## 2013-03-08 DIAGNOSIS — R000 Tachycardia, unspecified: Secondary | ICD-10-CM

## 2013-03-08 DIAGNOSIS — A0472 Enterocolitis due to Clostridium difficile, not specified as recurrent: Secondary | ICD-10-CM | POA: Diagnosis present

## 2013-03-08 DIAGNOSIS — R112 Nausea with vomiting, unspecified: Secondary | ICD-10-CM

## 2013-03-08 DIAGNOSIS — F419 Anxiety disorder, unspecified: Secondary | ICD-10-CM

## 2013-03-08 DIAGNOSIS — K8689 Other specified diseases of pancreas: Secondary | ICD-10-CM

## 2013-03-08 DIAGNOSIS — E876 Hypokalemia: Secondary | ICD-10-CM

## 2013-03-08 LAB — URINALYSIS, ROUTINE W REFLEX MICROSCOPIC
Glucose, UA: 100 mg/dL — AB
Leukocytes, UA: NEGATIVE
pH: 6 (ref 5.0–8.0)

## 2013-03-08 LAB — CBC WITH DIFFERENTIAL/PLATELET
Basophils Absolute: 0 10*3/uL (ref 0.0–0.1)
Basophils Relative: 0 % (ref 0–1)
Hemoglobin: 13.1 g/dL (ref 12.0–15.0)
MCHC: 34.7 g/dL (ref 30.0–36.0)
Monocytes Relative: 8 % (ref 3–12)
Neutro Abs: 12.5 10*3/uL — ABNORMAL HIGH (ref 1.7–7.7)
Neutrophils Relative %: 79 % — ABNORMAL HIGH (ref 43–77)
WBC: 15.8 10*3/uL — ABNORMAL HIGH (ref 4.0–10.5)

## 2013-03-08 LAB — POCT I-STAT, CHEM 8
Chloride: 107 mEq/L (ref 96–112)
HCT: 41 % (ref 36.0–46.0)
Potassium: 3 mEq/L — ABNORMAL LOW (ref 3.5–5.1)

## 2013-03-08 LAB — GLUCOSE, CAPILLARY: Glucose-Capillary: 121 mg/dL — ABNORMAL HIGH (ref 70–99)

## 2013-03-08 LAB — POCT I-STAT 3, VENOUS BLOOD GAS (G3P V)
Bicarbonate: 26 mEq/L — ABNORMAL HIGH (ref 20.0–24.0)
O2 Saturation: 92 %
pCO2, Ven: 38 mmHg — ABNORMAL LOW (ref 45.0–50.0)
pO2, Ven: 62 mmHg — ABNORMAL HIGH (ref 30.0–45.0)

## 2013-03-08 LAB — HEPATIC FUNCTION PANEL
AST: 59 U/L — ABNORMAL HIGH (ref 0–37)
Albumin: 4.4 g/dL (ref 3.5–5.2)
Total Bilirubin: 0.7 mg/dL (ref 0.3–1.2)

## 2013-03-08 LAB — MAGNESIUM: Magnesium: 1.7 mg/dL (ref 1.5–2.5)

## 2013-03-08 LAB — URINE MICROSCOPIC-ADD ON

## 2013-03-08 LAB — CG4 I-STAT (LACTIC ACID): Lactic Acid, Venous: 1.06 mmol/L (ref 0.5–2.2)

## 2013-03-08 MED ORDER — ONDANSETRON HCL 4 MG PO TABS
4.0000 mg | ORAL_TABLET | Freq: Four times a day (QID) | ORAL | Status: DC | PRN
  Administered 2013-03-08: 4 mg via ORAL
  Filled 2013-03-08 (×2): qty 1

## 2013-03-08 MED ORDER — ONDANSETRON HCL 4 MG/2ML IJ SOLN: 4.0000 mg | Freq: Three times a day (TID) | INTRAMUSCULAR | Status: DC | PRN

## 2013-03-08 MED ORDER — METOPROLOL TARTRATE 1 MG/ML IV SOLN
5.0000 mg | Freq: Four times a day (QID) | INTRAVENOUS | Status: DC | PRN
  Administered 2013-03-09: 5 mg via INTRAVENOUS
  Filled 2013-03-08 (×3): qty 5

## 2013-03-08 MED ORDER — METOPROLOL TARTRATE 12.5 MG HALF TABLET
12.5000 mg | ORAL_TABLET | Freq: Two times a day (BID) | ORAL | Status: DC
  Administered 2013-03-08 – 2013-03-09 (×3): 12.5 mg via ORAL
  Filled 2013-03-08 (×3): qty 1

## 2013-03-08 MED ORDER — SODIUM CHLORIDE 0.9 % IJ SOLN
3.0000 mL | Freq: Two times a day (BID) | INTRAMUSCULAR | Status: DC
  Administered 2013-03-11: 3 mL via INTRAVENOUS

## 2013-03-08 MED ORDER — ASPIRIN EC 81 MG PO TBEC
81.0000 mg | DELAYED_RELEASE_TABLET | Freq: Every day | ORAL | Status: DC
  Administered 2013-03-08 – 2013-03-12 (×5): 81 mg via ORAL
  Filled 2013-03-08 (×5): qty 1

## 2013-03-08 MED ORDER — ONDANSETRON HCL 4 MG/2ML IJ SOLN
4.0000 mg | Freq: Four times a day (QID) | INTRAMUSCULAR | Status: DC | PRN
  Administered 2013-03-08 – 2013-03-12 (×7): 4 mg via INTRAVENOUS
  Filled 2013-03-08 (×7): qty 2

## 2013-03-08 MED ORDER — SODIUM CHLORIDE 0.9 % IV BOLUS (SEPSIS)
1000.0000 mL | Freq: Once | INTRAVENOUS | Status: AC
  Administered 2013-03-08: 1000 mL via INTRAVENOUS

## 2013-03-08 MED ORDER — GABAPENTIN 400 MG PO CAPS
400.0000 mg | ORAL_CAPSULE | Freq: Three times a day (TID) | ORAL | Status: DC
  Administered 2013-03-08 – 2013-03-12 (×12): 400 mg via ORAL
  Filled 2013-03-08 (×17): qty 1

## 2013-03-08 MED ORDER — IRBESARTAN 300 MG PO TABS
300.0000 mg | ORAL_TABLET | Freq: Every day | ORAL | Status: DC
  Administered 2013-03-08 – 2013-03-09 (×2): 300 mg via ORAL
  Filled 2013-03-08 (×2): qty 1

## 2013-03-08 MED ORDER — METOCLOPRAMIDE HCL 5 MG/ML IJ SOLN
5.0000 mg | Freq: Three times a day (TID) | INTRAMUSCULAR | Status: DC
  Filled 2013-03-08: qty 2

## 2013-03-08 MED ORDER — SODIUM CHLORIDE 0.9 % IV SOLN
Freq: Once | INTRAVENOUS | Status: AC
  Administered 2013-03-08: 09:00:00 via INTRAVENOUS

## 2013-03-08 MED ORDER — ONDANSETRON 4 MG PO TBDP
4.0000 mg | ORAL_TABLET | Freq: Once | ORAL | Status: AC
  Administered 2013-03-08: 4 mg via ORAL
  Filled 2013-03-08: qty 1

## 2013-03-08 MED ORDER — FENTANYL CITRATE 0.05 MG/ML IJ SOLN
50.0000 ug | Freq: Once | INTRAMUSCULAR | Status: DC
  Filled 2013-03-08: qty 2

## 2013-03-08 MED ORDER — HYDROMORPHONE HCL PF 1 MG/ML IJ SOLN
1.0000 mg | Freq: Once | INTRAMUSCULAR | Status: AC
  Administered 2013-03-08: 1 mg via INTRAVENOUS
  Filled 2013-03-08: qty 1

## 2013-03-08 MED ORDER — INSULIN ASPART 100 UNIT/ML ~~LOC~~ SOLN
0.0000 [IU] | Freq: Three times a day (TID) | SUBCUTANEOUS | Status: DC
  Administered 2013-03-12: 2 [IU] via SUBCUTANEOUS

## 2013-03-08 MED ORDER — INSULIN ASPART 100 UNIT/ML ~~LOC~~ SOLN: 0.0000 [IU] | Freq: Every day | SUBCUTANEOUS | Status: DC

## 2013-03-08 MED ORDER — LACTATED RINGERS IV SOLN: INTRAVENOUS | Status: AC

## 2013-03-08 MED ORDER — SODIUM CHLORIDE 0.9 % IV SOLN
3.0000 g | Freq: Once | INTRAVENOUS | Status: AC
  Administered 2013-03-08: 3 g via INTRAVENOUS
  Filled 2013-03-08: qty 3

## 2013-03-08 MED ORDER — ENOXAPARIN SODIUM 40 MG/0.4ML ~~LOC~~ SOLN
40.0000 mg | SUBCUTANEOUS | Status: DC
  Administered 2013-03-08 – 2013-03-11 (×4): 40 mg via SUBCUTANEOUS
  Filled 2013-03-08 (×5): qty 0.4

## 2013-03-08 MED ORDER — ALPRAZOLAM 0.5 MG PO TABS
0.5000 mg | ORAL_TABLET | Freq: Two times a day (BID) | ORAL | Status: DC | PRN
  Administered 2013-03-08 – 2013-03-09 (×2): 0.5 mg via ORAL
  Filled 2013-03-08 (×4): qty 1

## 2013-03-08 MED ORDER — LORATADINE 10 MG PO TABS
10.0000 mg | ORAL_TABLET | Freq: Every morning | ORAL | Status: DC
  Administered 2013-03-09 – 2013-03-12 (×4): 10 mg via ORAL
  Filled 2013-03-08 (×5): qty 1

## 2013-03-08 MED ORDER — PANTOPRAZOLE SODIUM 40 MG PO TBEC
40.0000 mg | DELAYED_RELEASE_TABLET | Freq: Every day | ORAL | Status: DC
  Administered 2013-03-08 – 2013-03-11 (×4): 40 mg via ORAL
  Filled 2013-03-08 (×4): qty 1

## 2013-03-08 MED ORDER — ONDANSETRON HCL 4 MG/2ML IJ SOLN
4.0000 mg | Freq: Once | INTRAMUSCULAR | Status: AC
  Administered 2013-03-08: 4 mg via INTRAVENOUS
  Filled 2013-03-08: qty 2

## 2013-03-08 MED ORDER — DICYCLOMINE HCL 20 MG PO TABS
20.0000 mg | ORAL_TABLET | Freq: Two times a day (BID) | ORAL | Status: DC
  Filled 2013-03-08: qty 1

## 2013-03-08 MED ORDER — IOHEXOL 300 MG/ML  SOLN
50.0000 mL | Freq: Once | INTRAMUSCULAR | Status: AC | PRN
  Administered 2013-03-08: 50 mL via ORAL

## 2013-03-08 MED ORDER — METHOCARBAMOL 500 MG PO TABS
500.0000 mg | ORAL_TABLET | Freq: Two times a day (BID) | ORAL | Status: DC
  Administered 2013-03-08 – 2013-03-12 (×8): 500 mg via ORAL
  Filled 2013-03-08 (×11): qty 1

## 2013-03-08 MED ORDER — FENTANYL CITRATE 0.05 MG/ML IJ SOLN
50.0000 ug | Freq: Once | INTRAMUSCULAR | Status: AC
  Administered 2013-03-08: 50 ug via INTRAMUSCULAR

## 2013-03-08 MED ORDER — MIRTAZAPINE 15 MG PO TABS
15.0000 mg | ORAL_TABLET | Freq: Every day | ORAL | Status: DC
  Administered 2013-03-08 – 2013-03-09 (×2): 15 mg via ORAL
  Filled 2013-03-08 (×3): qty 1

## 2013-03-08 MED ORDER — POTASSIUM CHLORIDE 10 MEQ/100ML IV SOLN
10.0000 meq | INTRAVENOUS | Status: AC
  Administered 2013-03-08: 10 meq via INTRAVENOUS
  Filled 2013-03-08: qty 100

## 2013-03-08 MED ORDER — POLYETHYLENE GLYCOL 3350 17 G PO PACK
17.0000 g | PACK | Freq: Every day | ORAL | Status: DC
  Administered 2013-03-09 – 2013-03-11 (×3): 17 g via ORAL
  Filled 2013-03-08 (×4): qty 1

## 2013-03-08 MED ORDER — SODIUM CHLORIDE 0.9 % IV SOLN
250.0000 mL | INTRAVENOUS | Status: DC | PRN
  Administered 2013-03-10 – 2013-03-11 (×2): 250 mL via INTRAVENOUS

## 2013-03-08 MED ORDER — HYDRALAZINE HCL 20 MG/ML IJ SOLN
10.0000 mg | Freq: Four times a day (QID) | INTRAMUSCULAR | Status: DC | PRN
  Administered 2013-03-08 – 2013-03-09 (×3): 10 mg via INTRAVENOUS
  Filled 2013-03-08 (×3): qty 1

## 2013-03-08 MED ORDER — IOHEXOL 300 MG/ML  SOLN
100.0000 mL | Freq: Once | INTRAMUSCULAR | Status: AC | PRN
  Administered 2013-03-08: 100 mL via INTRAVENOUS

## 2013-03-08 MED ORDER — SODIUM CHLORIDE 0.9 % IJ SOLN
3.0000 mL | INTRAMUSCULAR | Status: DC | PRN
  Administered 2013-03-08: 3 mL via INTRAVENOUS

## 2013-03-08 MED ORDER — HYDROMORPHONE HCL PF 1 MG/ML IJ SOLN
0.5000 mg | INTRAMUSCULAR | Status: DC | PRN
  Administered 2013-03-08 – 2013-03-09 (×4): 0.5 mg via INTRAVENOUS
  Filled 2013-03-08 (×4): qty 1

## 2013-03-08 MED ORDER — ALBUTEROL SULFATE HFA 108 (90 BASE) MCG/ACT IN AERS
2.0000 | INHALATION_SPRAY | Freq: Four times a day (QID) | RESPIRATORY_TRACT | Status: DC | PRN
  Filled 2013-03-08: qty 6.7

## 2013-03-08 NOTE — ED Notes (Signed)
IV team here to attempt iv start.

## 2013-03-08 NOTE — ED Notes (Signed)
We will contact CT when normal saline bolus is complete per Dr. Rhunette Croft.

## 2013-03-08 NOTE — H&P (Signed)
[facility] History and Physical  Cianni Manny NFA:[id number] DOB: November 27, 1950 DOA: 03/08/2013  Referring physician: er PCP: [name], MD  Specialists: GI- [place]  Chief Complaint: N/V/D  HPI: [name] [name] is a 62 y.o. female  Who was recently treated in the hospital for c -diff (12/2012).  [name] [name] Describe pain as a cramping sensation, persistent, with associate nausea and vomiting. She has had 3 bouts of nonbloody nonbilious vomitus. She also endorsed some mild loose stool without blood or mucus.  She was d/c'd home.  She came back on 4/19 with similar complaints.  A CT scan was obtained in the ER and it showed a Borderline prominence of the common hepatic duct to 8 mm, and  mild distension of the pancreatic duct to 3-4 mm, without evidence of distal obstruction. GI was consulted in the ER.  She was found to have a low K level, it was replaced.  Patient was given 1 dose of Invanz and hospitalist were called for the admission.    No cough/fever or chills +diarrhea and hard stools mixed together per patient     Review of Systems: all systems reviewed, negative unless stated above    Past Medical History  Diagnosis Date  . Hypertension   . Diabetes mellitus   . Bipolar 1 disorder   . Heart murmur   . Sciatica   . Arthritis   . Hepatitis     hep c ?, pt states thinks  . Anxiety   . Diverticulosis   . Chronic low back pain   . DDD (degenerative disc disease), lumbar   . Chronic shoulder pain   . Chronic neck pain    Past Surgical History  Procedure Laterality Date  . Facial tumor removal    . Brain tumor removal    . Brain surgery      in [place]  . Video bronchoscopy  12/04/2012    Procedure: VIDEO BRONCHOSCOPY;  Surgeon: Delight Ovens, MD;  Location: [facility] OR;  Service: Thoracic;   Laterality: N/A;  . Video assisted thoracoscopy (vats)/wedge resection  12/04/2012    Procedure: VIDEO ASSISTED THORACOSCOPY (VATS)/WEDGE RESECTION;  Surgeon: Delight Ovens, MD;  Location: [facility] OR;  Service: Thoracic;  Laterality: Right;  . Lobectomy  12/04/2012    Procedure: LOBECTOMY;  Surgeon: Delight Ovens, MD;  Location: [facility] OR;  Service: Thoracic;  Laterality: Right;  completion of right upper lobectomy and lymph node disection, placement of on q pump   Social History:  reports that she has been smoking Cigarettes.  She has been smoking about 0.00 packs per day for the past 10 years. She has never used smokeless tobacco. She reports that she uses illicit drugs (Hydrocodone and Marijuana). She reports that she does not drink alcohol.  Lives with daughter at home   Allergies  Allergen Reactions  . Haldol (Haloperidol Decanoate) Other (See Comments)    tongue sweeling  . Morphine And Related Hives    hives  . Penicillins Rash    Family History  Problem Relation Age of Onset  . Hypertension Father     deceased  . Diabetes Father   . Heart disease Father   . Hyperlipidemia Father   . Hypertension Mother   . Hyperlipidemia Mother   . Diabetes Mother   . Hypertension Sister   .  Hyperlipidemia Sister   . Hypertension Brother   . Hyperlipidemia Brother     Prior to Admission medications   Medication Sig Start Date End Date Taking? Authorizing Provider  albuterol (PROVENTIL HFA;VENTOLIN HFA) 108 (90 BASE) MCG/ACT inhaler Inhale 2 puffs into the lungs every 6 (six) hours as needed (for shortness of breath). For shortness of breath   Yes Historical Provider, MD  ALPRAZolam Prudy Feeler) 1 MG tablet Take 0.5 mg by mouth 2 (two) times daily as needed (for sleep or anxiety). For sleep and anxiety   Yes Historical Provider, MD  aspirin EC 81 MG tablet Take 81 mg by mouth daily.   Yes Historical Provider, MD  dexlansoprazole (DEXILANT) 60 MG capsule Take 60 mg by mouth every morning.     Yes Historical Provider, MD  dicyclomine (BENTYL) 20 MG tablet Take 1 tablet (20 mg total) by mouth 2 (two) times daily. 02/04/13  Yes Loren Racer, MD  ergocalciferol (VITAMIN D2) 50000 UNITS capsule Take 50,000 Units by mouth every Monday.   Yes Historical Provider, MD  gabapentin (NEURONTIN) 400 MG capsule Take 400 mg by mouth 3 (three) times daily.    Yes Historical Provider, MD  hydrochlorothiazide (HYDRODIURIL) 25 MG tablet Take 25 mg by mouth daily.   Yes Historical Provider, MD  HYDROcodone-acetaminophen (NORCO/VICODIN) 5-325 MG per tablet Take 1 tablet by mouth every 6 (six) hours as needed for pain. 03/01/13  Yes Derwood Kaplan, MD  ibuprofen (ADVIL,MOTRIN) 400 MG tablet Take 1 tablet (400 mg total) by mouth every 6 (six) hours as needed for pain. 03/01/13  Yes Derwood Kaplan, MD  loratadine (CLARITIN) 10 MG tablet Take 10 mg by mouth every morning.    Yes Historical Provider, MD  metFORMIN (GLUCOPHAGE) 500 MG tablet Take 500 mg by mouth 2 (two) times daily with a meal.    Yes Historical Provider, MD  methocarbamol (ROBAXIN) 500 MG tablet Take 1 tablet (500 mg total) by mouth 2 (two) times daily. 03/01/13  Yes Derwood Kaplan, MD  metoCLOPramide (REGLAN) 5 MG tablet Take 1 tablet (5 mg total) by mouth 4 (four) times daily -  before meals and at bedtime. 01/03/13  Yes Osvaldo Shipper, MD  metoprolol tartrate (LOPRESSOR) 25 MG tablet Take 12.5 mg by mouth 2 (two) times daily.   Yes Historical Provider, MD  mirtazapine (REMERON) 15 MG tablet Take 15 mg by mouth at bedtime.   Yes Historical Provider, MD  olmesartan (BENICAR) 40 MG tablet Take 40 mg by mouth daily.   Yes Historical Provider, MD  ondansetron (ZOFRAN) 4 MG tablet Take 1 tablet (4 mg total) by mouth every 6 (six) hours. 03/07/13  Yes Fayrene Helper, PA-C  oxyCODONE-acetaminophen (PERCOCET/ROXICET) 5-325 MG per tablet Take 1 tablet by mouth every 4 (four) hours as needed for pain.   Yes Historical Provider, MD  polyethylene glycol (MIRALAX /  GLYCOLAX) packet Take 17 g by mouth daily. 02/04/13  Yes Loren Racer, MD  simvastatin (ZOCOR) 20 MG tablet Take 20 mg by mouth every evening.   Yes Historical Provider, MD   Physical Exam: Filed Vitals:   03/08/13 0700 03/08/13 0727 03/08/13 0900 03/08/13 1022  BP: 175/102 171/115 141/82   Pulse: 118  99   Temp:    98.8 F (37.1 C)  TempSrc:    Oral  Resp: 22 20 22    SpO2: 96% 95% 96%     Constitutional: She appears well-developed and well-nourished. No distress.  Awake, alert, nontoxic appearance  HENT:  Head: Atraumatic.  Eyes: Conjunctivae are normal. Right eye exhibits no discharge. Left eye exhibits no discharge.  Neck: Neck supple.  Cardiovascular: Normal rate and regular rhythm.  Pulmonary/Chest: Effort normal. No respiratory distress. She exhibits no tenderness.  Abdominal: Soft. Bowel sounds are normal. Mild tenderness in epigastric region  No CVA tenderness  Musculoskeletal: She exhibits no edema and no tenderness.  ROM appears intact, no obvious focal weakness  Neurological:  Mental status and motor strength appears intact  Skin: No rash noted.  Psychiatric: She has a normal mood and affect  Labs on Admission:  Basic Metabolic Panel:  Recent Labs Lab 03/07/13 1235 03/08/13 0745 03/08/13 0808  NA 139 143  --   K 3.2* 3.0*  --   CL 99 107  --   CO2 23  --   --   GLUCOSE 164* 126*  --   BUN 16 17  --   CREATININE 0.76 0.60  --   CALCIUM 10.7*  --   --   MG  --   --  1.7   Liver Function Tests:  Recent Labs Lab 03/07/13 1235 03/08/13 0720  AST 30 59*  ALT 12 24  ALKPHOS 89 74  BILITOT 0.6 0.7  PROT 8.7* 7.9  ALBUMIN 4.9 4.4    Recent Labs Lab 03/07/13 1235 03/08/13 0720  LIPASE 14 17   No results found for this basename: AMMONIA,  in the last 168 hours CBC:  Recent Labs Lab 03/07/13 1235 03/08/13 0720 03/08/13 0745  WBC 10.3 15.8*  --   NEUTROABS 8.5* 12.5*  --   HGB 14.8 13.1 13.9  HCT 41.8 37.7 41.0  MCV 80.1 80.4  --    PLT 319 265  --    Cardiac Enzymes: No results found for this basename: CKTOTAL, CKMB, CKMBINDEX, TROPONINI,  in the last 168 hours  BNP (last 3 results) No results found for this basename: PROBNP,  in the last 8760 hours CBG: No results found for this basename: GLUCAP,  in the last 168 hours  Radiological Exams on Admission: Ct Abdomen Pelvis W Contrast  03/08/2013  *RADIOLOGY REPORT*  Clinical Data: Abdominal pain, nausea and vomiting.  CT ABDOMEN AND PELVIS WITH CONTRAST  Technique:  Multidetector CT imaging of the abdomen and pelvis was performed following the standard protocol during bolus administration of intravenous contrast.  Contrast: OMNIPAQUE IOHEXOL 300 MG/ML  SOLN  Comparison: Abdominal radiograph performed 02/04/2013, and CT of the abdomen and pelvis performed 12/28/2012; abdominal ultrasound performed 01/01/2013  Findings: Hazy ground-glass opacities are noted at the right lung base; this is suspicious for an acute infectious process.  A tiny hiatal hernia is seen.  There is borderline prominence of the common hepatic duct, measuring 8 mm, and mild distension of the pancreatic duct, measuring 3-4 mm.  A tiny hypodensity at the inferior tail of the liver is nonspecific but may reflect a small cyst.  The gallbladder is within normal limits.  The pancreas and adrenal glands are unremarkable.  The kidneys are unremarkable in appearance.  There is no evidence of hydronephrosis.  No renal or ureteral stones are seen.  No perinephric stranding is appreciated.  No free fluid is identified.  The small bowel is unremarkable in appearance.  The stomach is within normal limits.  No acute vascular abnormalities are seen.  Mild scattered calcification is noted along the abdominal aorta.  The appendix is normal in caliber and contains contrast, without evidence for appendicitis.  Contrast progresses to the level of  the rectum.  The colon is unremarkable in appearance.  The bladder is mildly  distended and grossly unremarkable in appearance.  The uterus is within normal limits.  The ovaries are relatively symmetric; no suspicious adnexal masses are seen.  No inguinal lymphadenopathy is seen.  No acute osseous abnormalities are identified.  Facet disease is noted at the lower lumbar spine, with associated grade 1 anterolisthesis of L5 on S1, and mild disc space narrowing.  IMPRESSION:  1.  Multiple areas of hazy ground-glass opacity at the right lung base, suspicious for an acute infectious process. 2.  Borderline prominence of the common hepatic duct to 8 mm, and mild distension of the pancreatic duct to 3-4 mm, without evidence of distal obstruction.  This is slightly more prominent than on the prior study. 3.  Tiny hiatal hernia seen. 4.  Likely tiny hepatic cyst. 5.  Mild scattered calcification along the abdominal aorta and its branches.   Original Report Authenticated By: Tonia Ghent, M.D.    Dg Chest Port 1 View  03/08/2013  *RADIOLOGY REPORT*  Clinical Data: Abdominal pain.  PORTABLE CHEST - 1 VIEW  Comparison: 02/04/2013.  Findings: Apical lordotic projection.  Cardiopericardial silhouette appears within normal limits. Monitoring leads are projected over the chest.  No airspace disease.  No effusion.  Chest radiograph appears similar to the prior exam.  There is no free air underneath the hemidiaphragms.  Contour abnormality at the right cardiophrenic angle is consistent with eventration of the right hemidiaphragm seen on prior chest CT.  IMPRESSION: No active cardiopulmonary disease.  No free air underneath the hemidiaphragms in this patient with abdominal pain.   Original Report Authenticated By: Andreas Newport, M.D.       Assessment/Plan Active Problems:   Bipolar disorder   Enteritis due to Clostridium difficile   Nausea with vomiting   1. N/V- occ diarrhea:  Symptomatic treatment- CT scan showed some enlarged ducts and GI has been consulted for work up- trend liver enzymes;  continue zofran and reglan 2. ?PNA- ground glass on CT scan- hold on abx as no URI symptoms- trend WBC and await c diff 3. Bipolar- continue home meds 4. Recent c -diff- treated for 54 days per patient- finished treatment 5. Hypokalemia- replete 6. Mild leukocytosis- trend 7. DM- HgbA1c and SSI   GI consulted by Er dr  Code Status: full Family Communication: patient at bedside Disposition Plan: obs  Time spent: 82  [facility], Tywanda Rice [facility] Pager [phone]  If 7PM-7AM, please contact night-coverage [email or web address] Password [facility] 03/08/2013, 11:51 AM

## 2013-03-08 NOTE — ED Provider Notes (Signed)
History     CSN: [id number]  Arrival date & time 03/07/13  2121   First MD Initiated Contact with Patient 03/08/13 0007      Chief Complaint  Patient presents with  . Abdominal Pain    (Consider location/radiation/quality/duration/timing/severity/associated sxs/prior treatment) HPI Comments: [name] a 62 y/o F with PMHx of DM, HTN, and bipolar disorder presenting to the ED with abdominal pain, nausea, and vomiting. Patient was previously in the hospital for similar reason yesterday afternoon, was discharged. Patient reported that abdominal pain began again at 5:30pm yesterday evening. Patient reported that the abdominal pain is predominantly within the upper quadrants, described as a shooting, cutting sensation - referred to as a "terrible" pain - without radiation. Patient stated that pain mimics the pain that she had yesterday afternoon. Patient stated that she vomited at least 10 times, mainly of fluid contents with minimal streaks of blood. Associated symptoms are nausea and stools that have a foil smell - as per patient. Denied neck pain, back pain, chest pain, shortness of breathe, difficulty breathing, fever, chills, headache, dizziness, hemoptysis, urinary symptoms, blurred vision.     The history is provided by the patient. No language interpreter was used.    Past Medical History  Diagnosis Date  . Hypertension   . Diabetes mellitus   . Bipolar 1 disorder   . Heart murmur   . Sciatica   . Arthritis   . Hepatitis     hep c ?, pt states thinks  . Anxiety   . Diverticulosis   . Chronic low back pain   . DDD (degenerative disc disease), lumbar   . Chronic shoulder pain   . Chronic neck pain     Past Surgical History  Procedure Laterality Date  . Facial tumor removal    . Brain tumor removal    . Brain surgery      in [place]  . Video bronchoscopy  12/04/2012    Procedure: VIDEO BRONCHOSCOPY;  Surgeon: Delight Ovens, MD;  Location: [facility] OR;  Service:  Thoracic;  Laterality: N/A;  . Video assisted thoracoscopy (vats)/wedge resection  12/04/2012    Procedure: VIDEO ASSISTED THORACOSCOPY (VATS)/WEDGE RESECTION;  Surgeon: Delight Ovens, MD;  Location: [facility] OR;  Service: Thoracic;  Laterality: Right;  . Lobectomy  12/04/2012    Procedure: LOBECTOMY;  Surgeon: Delight Ovens, MD;  Location: [facility] OR;  Service: Thoracic;  Laterality: Right;  completion of right upper lobectomy and lymph node disection, placement of on q pump    Family History  Problem Relation Age of Onset  . Hypertension Father     deceased  . Diabetes Father   . Heart disease Father   . Hyperlipidemia Father   . Hypertension Mother   . Hyperlipidemia Mother   . Diabetes Mother   . Hypertension Sister   . Hyperlipidemia Sister   . Hypertension Brother   . Hyperlipidemia Brother     History  Substance Use Topics  . Smoking status: Current Some Day Smoker -- 10 years    Types: Cigarettes  . Smokeless tobacco: Never Used     Comment: Smokes 1/2 pack per week  . Alcohol Use: No     Comment: quit 22yrs ago    OB History   Grav Para Term Preterm Abortions TAB SAB Ect Mult Living                  Review of Systems  Constitutional: Negative for fever and chills.  HENT: Negative for ear pain, sore throat, trouble swallowing, neck pain and neck stiffness.   Eyes: Negative for pain and visual disturbance.  Respiratory: Negative for chest tightness and shortness of breath.   Cardiovascular: Negative for chest pain.  Gastrointestinal: Positive for nausea, vomiting and abdominal pain. Negative for diarrhea and constipation.  Genitourinary: Negative for decreased urine volume and difficulty urinating.  Musculoskeletal: Negative for back pain.  Skin: Negative for rash.  Neurological: Negative for dizziness, weakness, numbness and headaches.  All other systems reviewed and are negative.    Allergies  Haldol; Morphine and related; and Penicillins  Home Medications    Current Outpatient Rx  Name  Route  Sig  Dispense  Refill  . albuterol (PROVENTIL HFA;VENTOLIN HFA) 108 (90 BASE) MCG/ACT inhaler   Inhalation   Inhale 2 puffs into the lungs every 6 (six) hours as needed (for shortness of breath). For shortness of breath         . ALPRAZolam (XANAX) 1 MG tablet   Oral   Take 0.5 mg by mouth 2 (two) times daily as needed (for sleep or anxiety). For sleep and anxiety         . aspirin EC 81 MG tablet   Oral   Take 81 mg by mouth daily.         Marland Kitchen dexlansoprazole (DEXILANT) 60 MG capsule   Oral   Take 60 mg by mouth every morning.          . dicyclomine (BENTYL) 20 MG tablet   Oral   Take 1 tablet (20 mg total) by mouth 2 (two) times daily.   20 tablet   0   . ergocalciferol (VITAMIN D2) 50000 UNITS capsule   Oral   Take 50,000 Units by mouth every Monday.         . gabapentin (NEURONTIN) 400 MG capsule   Oral   Take 400 mg by mouth 3 (three) times daily.          . hydrochlorothiazide (HYDRODIURIL) 25 MG tablet   Oral   Take 25 mg by mouth daily.         Marland Kitchen HYDROcodone-acetaminophen (NORCO/VICODIN) 5-325 MG per tablet   Oral   Take 1 tablet by mouth every 6 (six) hours as needed for pain.   15 tablet   0   . ibuprofen (ADVIL,MOTRIN) 400 MG tablet   Oral   Take 1 tablet (400 mg total) by mouth every 6 (six) hours as needed for pain.   30 tablet   0   . loratadine (CLARITIN) 10 MG tablet   Oral   Take 10 mg by mouth every morning.          . metFORMIN (GLUCOPHAGE) 500 MG tablet   Oral   Take 500 mg by mouth 2 (two) times daily with a meal.          . methocarbamol (ROBAXIN) 500 MG tablet   Oral   Take 1 tablet (500 mg total) by mouth 2 (two) times daily.   20 tablet   0   . metoCLOPramide (REGLAN) 5 MG tablet   Oral   Take 1 tablet (5 mg total) by mouth 4 (four) times daily -  before meals and at bedtime.   120 tablet   2   . metoprolol tartrate (LOPRESSOR) 25 MG tablet   Oral   Take 12.5 mg by  mouth 2 (two) times daily.         Marland Kitchen  mirtazapine (REMERON) 15 MG tablet   Oral   Take 15 mg by mouth at bedtime.         Marland Kitchen olmesartan (BENICAR) 40 MG tablet   Oral   Take 40 mg by mouth daily.         . ondansetron (ZOFRAN) 4 MG tablet   Oral   Take 1 tablet (4 mg total) by mouth every 6 (six) hours.   12 tablet   0   . oxyCODONE-acetaminophen (PERCOCET/ROXICET) 5-325 MG per tablet   Oral   Take 1 tablet by mouth every 4 (four) hours as needed for pain.         . polyethylene glycol (MIRALAX / GLYCOLAX) packet   Oral   Take 17 g by mouth daily.   14 each   0   . simvastatin (ZOCOR) 20 MG tablet   Oral   Take 20 mg by mouth every evening.           BP 165/107  Pulse 123  Temp(Src) 99 F (37.2 C) (Oral)  Resp 18  SpO2 97%  Physical Exam  Nursing note and vitals reviewed. Constitutional: She is oriented to person, place, and time. She appears well-developed and well-nourished. No distress.  Alert and oriented. Patient found laying in bed - almost appears as a renal colic, where patient is unable to lay still due to pain.   HENT:  Head: Normocephalic and atraumatic.  Mouth/Throat: Oropharynx is clear and moist. No oropharyngeal exudate.  Eyes: Conjunctivae and EOM are normal. Pupils are equal, round, and reactive to light.  Neck: Normal range of motion. Neck supple. No tracheal deviation present. No thyromegaly present.  Negative lymphadenopathy  Cardiovascular: Normal rate and regular rhythm.  Exam reveals no friction rub.   No murmur heard. Pulmonary/Chest: Effort normal and breath sounds normal. No respiratory distress.  Abdominal: Soft. Bowel sounds are normal. She exhibits no distension. There is tenderness. There is no rebound and no guarding.  Tenderness to be generalized, mainly to the epigastric region. No pain out of proportion. No masses noted - negative hernias. Negative McBurney's point.    Lymphadenopathy:    She has no cervical adenopathy.   Neurological: She is alert and oriented to person, place, and time. No cranial nerve deficit. She exhibits normal muscle tone. Coordination normal.  Skin: Skin is warm and dry. No rash noted. She is not diaphoretic. No erythema.  Psychiatric: She has a normal mood and affect. Her behavior is normal. Thought content normal.    ED Course  Procedures (including critical care time)  Labs Reviewed - No data to display No results found.   No diagnosis found.    MDM  Patient afebrile, mildly tachycardic, mild hypertension (trend seen in patient not being compliant to control of blood pressure), alert and oriented. I personally evaluated and examined the patient. Patient found in a renal colic-like behavior where she was unable to sit still due to the pain. Physical exam - tenderness to palpation to the epigastric region and generalized abdomen, no pain out of proportion noted - non-distended abdomen, soft, BS normoactive. CBC, CMP, UA, Urine drug screen, lipase, troponin and EKG performed yesterday (03/07/2013) - CBC negative findings, hypokalemia that was treated for with supplementation on 03/07/2013, ketones and proteins found in urine, urine positive for opiates/benzodiazepines/cannabis, lipase negative, troponin negative, EKG negative findings. Labs were not repeated at the time due to patient recently being to the ER 03/07/2013 evening - discussed this with Dr. Rhunette Croft  and agreed. CT abdomen and pelvis ordered to rule out stones, inflammation of the bowels, mesenteric ischemic changes. Patient given IM medications since difficult to get an IV going - nurse and IV team attempted and were unsuccessful - discussed with Dr. Rhunette Croft who stated will do US guided IV in order to get fluids going on patient. Discussed case with Dr. Rhunette Croft - transfer of care to Dr. Rhunette Croft at 2:48am 03/08/2013.      Raymon Mutton, PA-C 03/08/13 4098   Shared service with midlevel provider. I have personally seen  and examined the patient, providing direct face to face care, presenting with the chief complaint of abdominal pain, nausea. Physical exam findings include diffuse tenderness, no peritoneal signs. We got CT scan, that shows some no specific pancreatic duct and hepatic duct dilatation. GI - Dr. Dulce Sellar, informed. Med admit. I have reviewed the nursing documentation on past medical history, family history, and social history.  Angiocath insertion Performed by: Derwood Kaplan  Consent: Verbal consent obtained. Risks and benefits: risks, benefits and alternatives were discussed Time out: Immediately prior to procedure a "time out" was called to verify the correct patient, procedure, equipment, support staff and site/side marked as required.  Preparation: Patient was prepped and draped in the usual sterile fashion.  Vein Location: right forearm  Ultrasound Guided - YES  Gauge: 20 gauge  Normal blood return and flush without difficulty Patient tolerance: Patient tolerated the procedure well with no immediate complications.     Derwood Kaplan, MD 03/09/13 [id number]

## 2013-03-08 NOTE — Consult Note (Signed)
[facility] Consultation Note  Referring Provider:  Marlin Canary, DO [facility]) Primary Care Physician:  [name], MD Primary Gastroenterologist:  Dr. Carman Ching  Reason for Consultation:  Abdominal pain, nausea, vomiting  HPI: [name] [name] is a 62 y.o. female admitted for abdominal pain, nausea, vomiting.  Several recent ED visits for this.  Had C. Diff recently, completely treatment a couple months ago.  She has had persistent ill-defined abdominal pain, as well as alternating diarrhea/constipation (pellet-like stools).  Several recent CT scans, endoscopy, Ultrasound, all unrevealing.  Lost about 10 lbs recently.  No blood in stool.  EGD September 2013 showed medium-sized hiatal hernia, otherwise normal; colonoscopy recently showed 1cm ascending colon polyp, otherwise unrevealing (though suboptimal views into cecum due to redundant colon).   Past Medical History  Diagnosis Date  . Hypertension   . Diabetes mellitus   . Bipolar 1 disorder   . Heart murmur   . Sciatica   . Arthritis   . Hepatitis     hep c ?, pt states thinks  . Anxiety   . Diverticulosis   . Chronic low back pain   . DDD (degenerative disc disease), lumbar   . Chronic shoulder pain   . Chronic neck pain     Past Surgical History  Procedure Laterality Date  . Facial tumor removal    . Brain tumor removal    . Brain surgery      in [place]  . Video bronchoscopy  12/04/2012    Procedure: VIDEO BRONCHOSCOPY;  Surgeon: Delight Ovens, MD;  Location: [facility] OR;  Service: Thoracic;  Laterality: N/A;  . Video assisted thoracoscopy (vats)/wedge resection  12/04/2012    Procedure: VIDEO ASSISTED THORACOSCOPY (VATS)/WEDGE RESECTION;  Surgeon: Delight Ovens, MD;  Location: [facility] OR;  Service: Thoracic;  Laterality: Right;  . Lobectomy  12/04/2012    Procedure: LOBECTOMY;  Surgeon: Delight Ovens, MD;  Location: [facility] OR;  Service: Thoracic;  Laterality: Right;  completion of right upper lobectomy  and lymph node disection, placement of on q pump    Prior to Admission medications   Medication Sig Start Date End Date Taking? Authorizing Provider  albuterol (PROVENTIL HFA;VENTOLIN HFA) 108 (90 BASE) MCG/ACT inhaler Inhale 2 puffs into the lungs every 6 (six) hours as needed (for shortness of breath). For shortness of breath   Yes Historical Provider, MD  ALPRAZolam Prudy Feeler) 1 MG tablet Take 0.5 mg by mouth 2 (two) times daily as needed (for sleep or anxiety). For sleep and anxiety   Yes Historical Provider, MD  aspirin EC 81 MG tablet Take 81 mg by mouth daily.   Yes Historical Provider, MD  dexlansoprazole (DEXILANT) 60 MG capsule Take 60 mg by mouth every morning.    Yes Historical Provider, MD  dicyclomine (BENTYL) 20 MG tablet Take 1 tablet (20 mg total) by mouth 2 (two) times daily. 02/04/13  Yes Loren Racer, MD  ergocalciferol (VITAMIN D2) 50000 UNITS capsule Take 50,000 Units by mouth every Monday.   Yes Historical Provider, MD  gabapentin (NEURONTIN) 400 MG capsule Take 400 mg by mouth 3 (three) times daily.    Yes Historical Provider, MD  hydrochlorothiazide (HYDRODIURIL) 25 MG tablet Take 25 mg by mouth daily.   Yes Historical Provider, MD  HYDROcodone-acetaminophen (NORCO/VICODIN) 5-325 MG per tablet Take 1 tablet by mouth every 6 (six) hours as needed for pain. 03/01/13  Yes Derwood Kaplan, MD  ibuprofen (ADVIL,MOTRIN) 400 MG tablet Take 1 tablet (400 mg total) by  mouth every 6 (six) hours as needed for pain. 03/01/13  Yes Derwood Kaplan, MD  loratadine (CLARITIN) 10 MG tablet Take 10 mg by mouth every morning.    Yes Historical Provider, MD  metFORMIN (GLUCOPHAGE) 500 MG tablet Take 500 mg by mouth 2 (two) times daily with a meal.    Yes Historical Provider, MD  methocarbamol (ROBAXIN) 500 MG tablet Take 1 tablet (500 mg total) by mouth 2 (two) times daily. 03/01/13  Yes Derwood Kaplan, MD  metoCLOPramide (REGLAN) 5 MG tablet Take 1 tablet (5 mg total) by mouth 4 (four) times daily  -  before meals and at bedtime. 01/03/13  Yes Osvaldo Shipper, MD  metoprolol tartrate (LOPRESSOR) 25 MG tablet Take 12.5 mg by mouth 2 (two) times daily.   Yes Historical Provider, MD  mirtazapine (REMERON) 15 MG tablet Take 15 mg by mouth at bedtime.   Yes Historical Provider, MD  olmesartan (BENICAR) 40 MG tablet Take 40 mg by mouth daily.   Yes Historical Provider, MD  ondansetron (ZOFRAN) 4 MG tablet Take 1 tablet (4 mg total) by mouth every 6 (six) hours. 03/07/13  Yes Fayrene Helper, PA-C  oxyCODONE-acetaminophen (PERCOCET/ROXICET) 5-325 MG per tablet Take 1 tablet by mouth every 4 (four) hours as needed for pain.   Yes Historical Provider, MD  polyethylene glycol (MIRALAX / GLYCOLAX) packet Take 17 g by mouth daily. 02/04/13  Yes Loren Racer, MD  simvastatin (ZOCOR) 20 MG tablet Take 20 mg by mouth every evening.   Yes Historical Provider, MD    Current Facility-Administered Medications  Medication Dose Route Frequency Provider Last Rate Last Dose  . 0.9 %  sodium chloride infusion  250 mL Intravenous PRN Joseph Art, DO      . albuterol (PROVENTIL HFA;VENTOLIN HFA) 108 (90 BASE) MCG/ACT inhaler 2 puff  2 puff Inhalation Q6H PRN Joseph Art, DO      . ALPRAZolam Prudy Feeler) tablet 0.5 mg  0.5 mg Oral BID PRN Joseph Art, DO      . aspirin EC tablet 81 mg  81 mg Oral Daily Jessica U Vann, DO   81 mg at 03/08/13 1337  . dicyclomine (BENTYL) tablet 20 mg  20 mg Oral BID Joseph Art, DO      . enoxaparin (LOVENOX) injection 40 mg  40 mg Subcutaneous Q24H Jessica U Vann, DO      . gabapentin (NEURONTIN) capsule 400 mg  400 mg Oral TID Joseph Art, DO   400 mg at 03/08/13 1337  . hydrALAZINE (APRESOLINE) injection 10 mg  10 mg Intravenous Q6H PRN Joseph Art, DO      . HYDROmorphone (DILAUDID) injection 0.5 mg  0.5 mg Intravenous Q4H PRN Joseph Art, DO   0.5 mg at 03/08/13 1339  . insulin aspart (novoLOG) injection 0-15 Units  0-15 Units Subcutaneous TID WC Jessica U Vann, DO       . insulin aspart (novoLOG) injection 0-5 Units  0-5 Units Subcutaneous QHS Jessica U Vann, DO      . irbesartan (AVAPRO) tablet 300 mg  300 mg Oral Daily Joseph Art, DO   300 mg at 03/08/13 1338  . lactated ringers infusion   Intravenous STAT Derwood Kaplan, MD      . Melene Muller ON 03/09/2013] loratadine (CLARITIN) tablet 10 mg  10 mg Oral q morning - 10a Jessica U Vann, DO      . methocarbamol (ROBAXIN) tablet 500 mg  500 mg Oral BID  Joseph Art, DO      . metoCLOPramide (REGLAN) injection 5 mg  5 mg Intravenous Q8H Jessica U Vann, DO      . metoprolol (LOPRESSOR) injection 5 mg  5 mg Intravenous Q6H PRN Joseph Art, DO      . metoprolol tartrate (LOPRESSOR) tablet 12.5 mg  12.5 mg Oral BID Joseph Art, DO      . mirtazapine (REMERON) tablet 15 mg  15 mg Oral QHS Joseph Art, DO      . ondansetron (ZOFRAN) tablet 4 mg  4 mg Oral Q6H PRN Joseph Art, DO       Or  . ondansetron (ZOFRAN) injection 4 mg  4 mg Intravenous Q6H PRN Joseph Art, DO      . pantoprazole (PROTONIX) EC tablet 40 mg  40 mg Oral Daily Joseph Art, DO      . polyethylene glycol (MIRALAX / GLYCOLAX) packet 17 g  17 g Oral Daily Joseph Art, DO      . sodium chloride 0.9 % injection 3 mL  3 mL Intravenous Q12H Jessica U Vann, DO      . sodium chloride 0.9 % injection 3 mL  3 mL Intravenous PRN Joseph Art, DO        Allergies as of 03/07/2013 - Review Complete 03/07/2013  Allergen Reaction Noted  . Haldol (haloperidol decanoate) Other (See Comments) 11/09/2011  . Morphine and related Hives 11/09/2011  . Penicillins Rash 10/08/2012    Family History  Problem Relation Age of Onset  . Hypertension Father     deceased  . Diabetes Father   . Heart disease Father   . Hyperlipidemia Father   . Hypertension Mother   . Hyperlipidemia Mother   . Diabetes Mother   . Hypertension Sister   . Hyperlipidemia Sister   . Hypertension Brother   . Hyperlipidemia Brother     History   Social  History  . Marital Status: Widowed    Spouse Name: N/A    Number of Children: N/A  . Years of Education: N/A   Occupational History  . n/a     patient draws SN[employer]   Social History Main Topics  . Smoking status: Current Some Day Smoker -- 10 years    Types: Cigarettes  . Smokeless tobacco: Never Used     Comment: Smokes 1/2 pack per week  . Alcohol Use: No     Comment: quit 2yrs ago  . Drug Use: Yes    Special: Hydrocodone, Marijuana     Comment: 3 weeks ago  . Sexually Active: No   Other Topics Concern  . Not on file   Social History Narrative  . No narrative on file    Review of Systems: As per HPI, all others negative  Physical Exam: Vital signs in last 24 hours: Temp:  [98.5 F (36.9 C)-99 F (37.2 C)] 98.5 F (36.9 C) (04/19 1050) Pulse Rate:  [99-127] 111 (04/19 1050) Resp:  [18-22] 20 (04/19 1050) BP: (141-193)/(82-119) 175/100 mmHg (04/19 1050) SpO2:  [95 %-100 %] 99 % (04/19 1050) Weight:  [68.947 kg (152 lb)] 68.947 kg (152 lb) (04/19 1050)   General:   Alert,  Chronically ill-appearing,  Moving randomly around on bed (due to pain per patient); cooperative in NAD Head:  Normocephalic and atraumatic. Eyes:  Sclera clear, no icterus.   Conjunctiva pink. Ears:  Normal auditory acuity. Nose:  No deformity, discharge,  or lesions.  Mouth:  Edentulous; Has lip-smacking motions worrisome for possible tardive dyskinesia; No deformity or lesions.  Oropharynx pink & moist. Neck:  Supple; no masses or thyromegaly. Lungs:  Clear throughout to auscultation.   No wheezes, crackles, or rhonchi. No acute distress. Heart:  Regular rate and rhythm; no murmurs, clicks, rubs,  or gallops. Abdomen:  Soft, active bowel sounds, mild generalized tenderness without peritonitis. No masses, hepatosplenomegaly or hernias noted. Normal bowel sounds, without guarding, and without rebound.     Msk:  Symmetrical without gross deformities. Normal posture. Pulses:  Normal pulses  noted. Extremities:  Without clubbing or edema. Neurologic:  Alert and  oriented x4;  Diffusely weak, otherwise difficult to assess given patient's constant moving around in bed; seemingly grossly normal neurologically. Skin:  Intact without significant lesions or rashes. Psych:  Alert and cooperative. Depressed mood, flat affect.  Lab Results:  Recent Labs  03/07/13 1235 03/08/13 0720 03/08/13 0745  WBC 10.3 15.8*  --   HGB 14.8 13.1 13.9  HCT 41.8 37.7 41.0  PLT 319 265  --    BMET  Recent Labs  03/07/13 1235 03/08/13 0745  NA 139 143  K 3.2* 3.0*  CL 99 107  CO2 23  --   GLUCOSE 164* 126*  BUN 16 17  CREATININE 0.76 0.60  CALCIUM 10.7*  --    LFT  Recent Labs  03/08/13 0720  PROT 7.9  ALBUMIN 4.4  AST 59*  ALT 24  ALKPHOS 74  BILITOT 0.7  BILIDIR 0.1  IBILI 0.6   PT/INR No results found for this basename: LABPROT, INR,  in the last 72 hours  Studies/Results: Ct Abdomen Pelvis W Contrast  03/08/2013  *RADIOLOGY REPORT*  Clinical Data: Abdominal pain, nausea and vomiting.  CT ABDOMEN AND PELVIS WITH CONTRAST  Technique:  Multidetector CT imaging of the abdomen and pelvis was performed following the standard protocol during bolus administration of intravenous contrast.  Contrast: OMNIPAQUE IOHEXOL 300 MG/ML  SOLN  Comparison: Abdominal radiograph performed 02/04/2013, and CT of the abdomen and pelvis performed 12/28/2012; abdominal ultrasound performed 01/01/2013  Findings: Hazy ground-glass opacities are noted at the right lung base; this is suspicious for an acute infectious process.  A tiny hiatal hernia is seen.  There is borderline prominence of the common hepatic duct, measuring 8 mm, and mild distension of the pancreatic duct, measuring 3-4 mm.  A tiny hypodensity at the inferior tail of the liver is nonspecific but may reflect a small cyst.  The gallbladder is within normal limits.  The pancreas and adrenal glands are unremarkable.  The kidneys are  unremarkable in appearance.  There is no evidence of hydronephrosis.  No renal or ureteral stones are seen.  No perinephric stranding is appreciated.  No free fluid is identified.  The small bowel is unremarkable in appearance.  The stomach is within normal limits.  No acute vascular abnormalities are seen.  Mild scattered calcification is noted along the abdominal aorta.  The appendix is normal in caliber and contains contrast, without evidence for appendicitis.  Contrast progresses to the level of the rectum.  The colon is unremarkable in appearance.  The bladder is mildly distended and grossly unremarkable in appearance.  The uterus is within normal limits.  The ovaries are relatively symmetric; no suspicious adnexal masses are seen.  No inguinal lymphadenopathy is seen.  No acute osseous abnormalities are identified.  Facet disease is noted at the lower lumbar spine, with associated grade 1 anterolisthesis of L5  on S1, and mild disc space narrowing.  IMPRESSION:  1.  Multiple areas of hazy ground-glass opacity at the right lung base, suspicious for an acute infectious process. 2.  Borderline prominence of the common hepatic duct to 8 mm, and mild distension of the pancreatic duct to 3-4 mm, without evidence of distal obstruction.  This is slightly more prominent than on the prior study. 3.  Tiny hiatal hernia seen. 4.  Likely tiny hepatic cyst. 5.  Mild scattered calcification along the abdominal aorta and its branches.   Original Report Authenticated By: Tonia Ghent, M.D.    Dg Chest Port 1 View  03/08/2013  *RADIOLOGY REPORT*  Clinical Data: Abdominal pain.  PORTABLE CHEST - 1 VIEW  Comparison: 02/04/2013.  Findings: Apical lordotic projection.  Cardiopericardial silhouette appears within normal limits. Monitoring leads are projected over the chest.  No airspace disease.  No effusion.  Chest radiograph appears similar to the prior exam.  There is no free air underneath the hemidiaphragms.  Contour  abnormality at the right cardiophrenic angle is consistent with eventration of the right hemidiaphragm seen on prior chest CT.  IMPRESSION: No active cardiopulmonary disease.  No free air underneath the hemidiaphragms in this patient with abdominal pain.   Original Report Authenticated By: Andreas Newport, M.D.     Impression:  1.  Abdominal pain, ill-defined, for the past several months.  Has had endoscopy, CT, ultrasound, all unrevealing. 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 2.  Pancreatic and biliary ductal dilatation, mild and ongoing for the past several months.  Suspect this is due to chronic narcotics. 3.  Nausea, vomiting.  Suspect component of medication-related nausea/vomiting.  Suspect component of gastroparesis (diabetic and narcotic-related). 4.  Diarrhea.  Unclear if this is acute process (i.e., acute gastroenteritis), or an acute flare of a chronic ill-defined process. 5.  Suspected tardive dyskinesia.  Plan:  1.  Discontinue metoclopramide and do NOT restart. 2. Cut back on narcotic analgesics, as clinically possible, suspect this will help her nausea and vomiting (and may help her pain as well, if she has component of narcotic bowel syndrome). 3.  Stool studies (GI Pathogen panel, including C. Diff). 4.  If symptoms persist and stool studies are unrevealing, consider UGI series with SBFT.  Doubt utility of gastric emptying study, as it will highly likely be positive in setting of patient's chronic narcotic use. 5.  If symptoms persist, and all above unrevealing, would have to consider constipation (narcotic- and diabetic-related) as potential source of at least some of her symptoms, with the "diarrhea" being overflow diarrhea from constipation. 5.  Will follow; thank you for the consult.   LOS: 0 days   Idrees Quam M  03/08/2013, 2:19 PM

## 2013-03-09 DIAGNOSIS — F329 Major depressive disorder, single episode, unspecified: Secondary | ICD-10-CM

## 2013-03-09 DIAGNOSIS — F1210 Cannabis abuse, uncomplicated: Secondary | ICD-10-CM

## 2013-03-09 LAB — HEPATITIS PANEL, ACUTE
HCV Ab: REACTIVE — AB
Hep B C IgM: NEGATIVE
Hepatitis B Surface Ag: NEGATIVE

## 2013-03-09 LAB — RAPID URINE DRUG SCREEN, HOSP PERFORMED
Cocaine: NOT DETECTED
Opiates: POSITIVE — AB
Tetrahydrocannabinol: POSITIVE — AB

## 2013-03-09 LAB — CBC
HCT: 39.6 % (ref 36.0–46.0)
Hemoglobin: 13.9 g/dL (ref 12.0–15.0)
MCH: 28 pg (ref 26.0–34.0)
MCHC: 35.1 g/dL (ref 30.0–36.0)
RDW: 14 % (ref 11.5–15.5)

## 2013-03-09 LAB — COMPREHENSIVE METABOLIC PANEL
ALT: 44 U/L — ABNORMAL HIGH (ref 0–35)
AST: 96 U/L — ABNORMAL HIGH (ref 0–37)
Alkaline Phosphatase: 71 U/L (ref 39–117)
CO2: 23 mEq/L (ref 19–32)
Chloride: 99 mEq/L (ref 96–112)
GFR calc Af Amer: >90 mL/min (ref >90)
GFR calc non Af Amer: >90 mL/min (ref >90)
Glucose, Bld: 123 mg/dL — ABNORMAL HIGH (ref 70–99)
Potassium: 4.1 mEq/L (ref 3.5–5.1)
Sodium: 138 mEq/L (ref 135–145)

## 2013-03-09 LAB — HEMOGLOBIN A1C: Mean Plasma Glucose: 128 mg/dL — ABNORMAL HIGH (ref <117)

## 2013-03-09 MED ORDER — TRAMADOL HCL 50 MG PO TABS
50.0000 mg | ORAL_TABLET | Freq: Four times a day (QID) | ORAL | Status: DC | PRN
  Administered 2013-03-10 – 2013-03-11 (×3): 50 mg via ORAL
  Filled 2013-03-09 (×4): qty 1

## 2013-03-09 MED ORDER — ALPRAZOLAM 0.5 MG PO TABS
0.5000 mg | ORAL_TABLET | Freq: Three times a day (TID) | ORAL | Status: DC
  Administered 2013-03-09 – 2013-03-12 (×10): 0.5 mg via ORAL
  Filled 2013-03-09 (×8): qty 1

## 2013-03-09 MED ORDER — IRBESARTAN 150 MG PO TABS
150.0000 mg | ORAL_TABLET | Freq: Every day | ORAL | Status: DC
  Administered 2013-03-10 – 2013-03-12 (×3): 150 mg via ORAL
  Filled 2013-03-09 (×3): qty 1

## 2013-03-09 MED ORDER — METOPROLOL TARTRATE 25 MG PO TABS
25.0000 mg | ORAL_TABLET | Freq: Two times a day (BID) | ORAL | Status: DC
  Administered 2013-03-10 – 2013-03-12 (×4): 25 mg via ORAL
  Filled 2013-03-09 (×7): qty 1

## 2013-03-09 MED ORDER — METOPROLOL TARTRATE 12.5 MG HALF TABLET
12.5000 mg | ORAL_TABLET | Freq: Once | ORAL | Status: AC
  Administered 2013-03-09: 12.5 mg via ORAL
  Filled 2013-03-09 (×2): qty 1

## 2013-03-09 MED ORDER — METOPROLOL TARTRATE 25 MG PO TABS: 25.0000 mg | ORAL_TABLET | Freq: Two times a day (BID) | ORAL | Status: DC

## 2013-03-09 MED ORDER — HYDROMORPHONE HCL PF 1 MG/ML IJ SOLN
0.5000 mg | Freq: Four times a day (QID) | INTRAMUSCULAR | Status: DC | PRN
  Administered 2013-03-09 – 2013-03-12 (×10): 0.5 mg via INTRAVENOUS
  Filled 2013-03-09 (×10): qty 1

## 2013-03-09 MED ORDER — WHITE PETROLATUM GEL
Status: AC
  Administered 2013-03-09: 0.2
  Filled 2013-03-09: qty 5

## 2013-03-09 NOTE — Progress Notes (Addendum)
PROGRESS NOTE  [name] [name] ZOX:[id number] DOB: [birth year] DOA: 03/08/2013 PCP: [name], MD  Brief narrative: 62 yr old female admitted 4.19 with n/v after return ED visit-found to have pancreatic duct dilatation  Past medical history-As per Problem list Chart reviewed as below- Multiple Ed vsits  4/18, 4/12 for abd pain, one 3/18 for hip pain Admission 12/29/12 for Cdiff colitis Seen by [facility] [pulm] and [facility] [cvct] for enlarguin RUL nodule-this was resected showing well diff adeno ca  Consultants:  None currently  Procedures:  none  Antibiotics:  none   Subjective  patient very squirmish and complaining of anxiety more so than abd pain [place] next to bedside shows clear liquid in it. She has difficulty focusing when i ask her specific questions and seems hypomanic   Objective    Telemetry: Will place on tele   Objective: Filed Vitals:   03/09/13 0315 03/09/13 0549 03/09/13 0557 03/09/13 0645  BP: 173/97 173/96 173/96 153/91  Pulse: 124 106  97  Temp: 99 F (37.2 C) 99.5 F (37.5 C)    TempSrc: Oral     Resp: 18 16    Height:      Weight:      SpO2: 99% 96%      Intake/Output Summary (Last 24 hours) at 03/09/13 0847 Last data filed at 03/09/13 0600  Gross per 24 hour  Intake    510 ml  Output    550 ml  Net    -40 ml    Exam:  General: alert anxious AAF frail female Cardiovascular:  s1 s2 tachcyardic Respiratory: clear.   Abdomen: No pain with firm pressure of stethoscope, BS heard.  However she winces when I place same amount of pressure in herabdomen with fingers   Data Reviewed: Basic Metabolic Panel:  Recent Labs Lab 03/07/13 1235 03/08/13 0745 03/08/13 0808 03/09/13 0555  NA 139 143  --  138  K 3.2* 3.0*  --  4.1  CL 99 107  --  99  CO2 23  --   --  23  GLUCOSE 164* 126*  --  123*  BUN 16 17  --  25*  CREATININE 0.76 0.60  --  0.54  CALCIUM 10.7*  --   --  10.1  MG  --   --  1.7  --    Liver Function  Tests:  Recent Labs Lab 03/07/13 1235 03/08/13 0720 03/09/13 0555  AST 30 59* 96*  ALT 12 24 44*  ALKPHOS 89 74 71  BILITOT 0.6 0.7 0.9  PROT 8.7* 7.9 7.9  ALBUMIN 4.9 4.4 4.2    Recent Labs Lab 03/07/13 1235 03/08/13 0720  LIPASE 14 17   No results found for this basename: AMMONIA,  in the last 168 hours CBC:  Recent Labs Lab 03/07/13 1235 03/08/13 0720 03/08/13 0745 03/09/13 0555  WBC 10.3 15.8*  --  16.6*  NEUTROABS 8.5* 12.5*  --   --   HGB 14.8 13.1 13.9 13.9  HCT 41.8 37.7 41.0 39.6  MCV 80.1 80.4  --  79.7  PLT 319 265  --  242   Cardiac Enzymes: No results found for this basename: CKTOTAL, CKMB, CKMBINDEX, TROPONINI,  in the last 168 hours BNP: No components found with this basename: POCBNP,  CBG:  Recent Labs Lab 03/08/13 1834 03/08/13 2237 03/09/13 0807  GLUCAP 121* 121* 107*    No results found for this or any previous visit (from the past 240 hour(s)).   Studies:  All Imaging reviewed and is as per above notation   Scheduled Meds: . aspirin EC  81 mg Oral Daily  . enoxaparin (LOVENOX) injection  40 mg Subcutaneous Q24H  . gabapentin  400 mg Oral TID  . insulin aspart  0-15 Units Subcutaneous TID WC  . insulin aspart  0-5 Units Subcutaneous QHS  . irbesartan  300 mg Oral Daily  . lactated ringers   Intravenous STAT  . loratadine  10 mg Oral q morning - 10a  . methocarbamol  500 mg Oral BID  . metoprolol tartrate  12.5 mg Oral BID  . mirtazapine  15 mg Oral QHS  . pantoprazole  40 mg Oral Daily  . polyethylene glycol  17 g Oral Daily  . sodium chloride  3 mL Intravenous Q12H   Continuous Infusions:    Assessment/Plan: 1. Abdominal pain with mild elevation of Transaminases-Thought potentially to be Narcotic/Diabetic bowel.  US done 2/14 doesn't show any evidence of cirrhosis/fatty liver-will cut dilaudid back to 05 q6rn-added tramadol 50 q6prn.  Would defer antinuclear antibodies, anti-smooth muscle antibodies, and  anti-liver/kidney microsomal antibodies at present.  Will check hepatitis serologies given h/o Heroin abuse 2.  recent Cdiff.  If persisting stool will obtain sepcimen 3. S/p Lung resection for adenoCa-stable 4. Bipolar-seems hypomanic.  psychiatry consulted to assist management on 4.120-increased her Xanax 0.5 bid to tid on 4.20.  Continue Remeron 15 gm qhs 5. DM ty 2-A1c 6.1-sugars well controlled on moderate SSI-takes metformin 500 bid at home 6. Htn-continue metoprolol 12.5 bid, Benicar 40 daily,  Avapro 300 daily-HCTZ currently on hold 7. Polysuabstance abuse-Marijuana + on tox screen 8. irreg HR-Rates in 130-140 and sounds irregular.  Keep ion tele  Full No family bedside Inpatient   Pleas Koch, MD  [facility] Pager [phone] 03/09/2013, 8:47 AM    LOS: 1 day

## 2013-03-09 NOTE — Progress Notes (Signed)
Awaiting stool studies (C diff PCR, GI pathogen panel).  Will revisit tomorrow, Monday 4/21.

## 2013-03-09 NOTE — Progress Notes (Signed)
Pt hr up in 130 s bp this afternoon 176/90 received apresoline, md notified pt received lopressor 12.5 mg and 5 mg iv hr down to 110s bp 146/66 will continue to monitor

## 2013-03-09 NOTE — Consult Note (Signed)
Reason for Consult: Bipolar Referring Physician: unknown  [name] [name] is an 62 y.o. female.  HPI:    Who was recently treated in the hospital for c -diff (12/2012). [name] [name] Describe pain as a cramping sensation, persistent, with associate nausea and vomiting. She has had 3 bouts of nonbloody nonbilious vomitus. She also endorsed some mild loose stool without blood or mucus. She was d/c'd home. She came back on 4/19 with similar complaints.    Seen today. Used to go to [place] in the past. Per pt she is feeling more anxious these days. Some depression symptoms too but very poor historian and not able to give much details at this time. Very concerned about poor sleep but relates that with GI upset now. Per pt her GI issues affecting her a lot right now. Denies any current manic symptoms or psychosis.   Past Medical History  Diagnosis Date  . Hypertension   . Diabetes mellitus   . Bipolar 1 disorder   . Heart murmur   . Sciatica   . Arthritis   . Hepatitis     hep c ?, pt states thinks  . Anxiety   . Diverticulosis   . Chronic low back pain   . DDD (degenerative disc disease), lumbar   . Chronic shoulder pain   . Chronic neck pain     Past Surgical History  Procedure Laterality Date  . Facial tumor removal    . Brain tumor removal    . Brain surgery      in [place]  . Video bronchoscopy  12/04/2012    Procedure: VIDEO BRONCHOSCOPY;  Surgeon: Delight Ovens, MD;  Location: [facility] OR;  Service: Thoracic;  Laterality: N/A;  . Video assisted thoracoscopy (vats)/wedge resection  12/04/2012    Procedure: VIDEO ASSISTED THORACOSCOPY (VATS)/WEDGE RESECTION;  Surgeon: Delight Ovens, MD;  Location: [facility] OR;  Service: Thoracic;  Laterality: Right;  . Lobectomy  12/04/2012    Procedure: LOBECTOMY;  Surgeon: Delight Ovens, MD;  Location: [facility] OR;  Service: Thoracic;  Laterality: Right;  completion of right upper lobectomy and lymph node disection, placement of on q pump    Family History  Problem Relation Age of Onset  . Hypertension Father     deceased  . Diabetes Father   . Heart disease Father   . Hyperlipidemia Father   . Hypertension Mother   . Hyperlipidemia Mother   . Diabetes Mother   . Hypertension Sister   . Hyperlipidemia Sister   . Hypertension Brother   . Hyperlipidemia Brother     Social History:  reports that she has been smoking Cigarettes.  She has been smoking about 0.00 packs per day for the past 10 years. She has never used smokeless tobacco. She reports that she uses illicit drugs (Hydrocodone and Marijuana). She reports that she does not drink alcohol.  Allergies:  Allergies  Allergen Reactions  . Haldol (Haloperidol Decanoate) Other (See Comments)    tongue sweeling  . Morphine And Related Hives    hives  . Penicillins Rash    Medications: I have reviewed the patient's current medications.  Results for orders placed during the hospital encounter of 03/08/13 (from the past 48 hour(s))  CBC WITH DIFFERENTIAL     Status: Abnormal   Collection Time    03/08/13  7:20 AM      Result Value Range   WBC 15.8 (*) 4.0 - 10.5 K/uL   RBC 4.69  3.87 - 5.11 MIL/uL   Hemoglobin 13.1  12.0 - 15.0 g/dL   HCT 16.1  09.6 - 04.5 %   MCV 80.4  78.0 - 100.0 fL   MCH 27.9  26.0 - 34.0 pg   MCHC 34.7  30.0 - 36.0 g/dL   RDW 40.9  81.1 - 91.4 %   Platelets 265  150 - 400 K/uL   Neutrophils Relative 79 (*) 43 - 77 %   Neutro Abs 12.5 (*) 1.7 - 7.7 K/uL   Lymphocytes Relative 13  12 - 46 %   Lymphs Abs 2.0  0.7 - 4.0 K/uL   Monocytes Relative 8  3 - 12 %   Monocytes Absolute 1.3 (*) 0.1 - 1.0 K/uL   Eosinophils Relative 0  0 - 5 %   Eosinophils Absolute 0.0  0.0 - 0.7 K/uL   Basophils Relative 0  0 - 1 %   Basophils Absolute 0.0  0.0 - 0.1 K/uL  HEPATIC FUNCTION PANEL      Status: Abnormal   Collection Time    03/08/13  7:20 AM      Result Value Range   Total Protein 7.9  6.0 - 8.3 g/dL   Albumin 4.4  3.5 - 5.2 g/dL   AST 59 (*) 0 - 37 U/L   ALT 24  0 - 35 U/L   Alkaline Phosphatase 74  39 - 117 U/L   Total Bilirubin 0.7  0.3 - 1.2 mg/dL   Bilirubin, Direct 0.1  0.0 - 0.3 mg/dL   Indirect Bilirubin 0.6  0.3 - 0.9 mg/dL  LIPASE, BLOOD     Status: None   Collection Time    03/08/13  7:20 AM      Result Value Range   Lipase 17  11 - 59 U/L  CG4 I-STAT (LACTIC ACID)     Status: None   Collection Time    03/08/13  7:44 AM      Result Value Range   Lactic Acid, Venous 1.06  0.5 - 2.2 mmol/L  POCT I-STAT, CHEM 8     Status: Abnormal   Collection Time    03/08/13  7:45 AM      Result Value Range   Sodium 143  135 - 145 mEq/L   Potassium 3.0 (*) 3.5 - 5.1 mEq/L   Chloride 107  96 - 112 mEq/L   BUN 17  6 - 23 mg/dL   Creatinine, Ser 7.82  0.50 - 1.10 mg/dL   Glucose, Bld 956 (*) 70 - 99 mg/dL   Calcium, Ion 2.13 (*) 1.13 - 1.30 mmol/L   TCO2 26  0 - 100 mmol/L   Hemoglobin 13.9  12.0 - 15.0 g/dL   HCT 08.6  57.8 - 46.9 %  MAGNESIUM     Status: None   Collection Time    03/08/13  8:08 AM      Result Value Range   Magnesium 1.7  1.5 - 2.5 mg/dL  POCT I-STAT 3, BLOOD GAS (G3P V)     Status: Abnormal   Collection Time    03/08/13 10:32 AM      Result Value Range   pH, Ven 7.443 (*) 7.250 - 7.300   pCO2, Ven 38.0 (*) 45.0 - 50.0 mmHg   pO2, Ven 62.0 (*) 30.0 - 45.0 mmHg   Bicarbonate 26.0 (*)  20.0 - 24.0 mEq/L   TCO2 27  0 - 100 mmol/L   O2 Saturation 92.0     Acid-Base Excess 2.0  0.0 - 2.0 mmol/L   Sample type VENOUS    URINALYSIS, ROUTINE W REFLEX MICROSCOPIC     Status: Abnormal   Collection Time    03/08/13 10:33 AM      Result Value Range   Color, Urine YELLOW  YELLOW   APPearance CLEAR  CLEAR   Specific Gravity, Urine 1.045 (*) 1.005 - 1.030   pH 6.0  5.0 - 8.0   Glucose, UA 100 (*) NEGATIVE mg/dL   Hgb urine dipstick MODERATE (*)  NEGATIVE   Bilirubin Urine NEGATIVE  NEGATIVE   Ketones, ur 15 (*) NEGATIVE mg/dL   Protein, ur 161 (*) NEGATIVE mg/dL   Urobilinogen, UA 0.2  0.0 - 1.0 mg/dL   Nitrite NEGATIVE  NEGATIVE   Leukocytes, UA NEGATIVE  NEGATIVE  URINE MICROSCOPIC-ADD ON     Status: Abnormal   Collection Time    03/08/13 10:33 AM      Result Value Range   Squamous Epithelial / LPF FEW (*) RARE   RBC / HPF 0-2  <3 RBC/hpf  HEMOGLOBIN A1C     Status: Abnormal   Collection Time    03/08/13 12:43 PM      Result Value Range   Hemoglobin A1C 6.1 (*) <5.7 %   Comment: (NOTE)                                                                               According to the ADA Clinical Practice Recommendations for 2011, when     HbA1c is used as a screening test:      >=6.5%   Diagnostic of Diabetes Mellitus               (if abnormal result is confirmed)     5.7-6.4%   Increased risk of developing Diabetes Mellitus     References:Diagnosis and Classification of Diabetes Mellitus,Diabetes     Care,2011,34(Suppl 1):S62-S69 and Standards of Medical Care in             Diabetes - 2011,Diabetes Care,2011,34 (Suppl 1):S11-S61.   Mean Plasma Glucose 128 (*) <117 mg/dL  GLUCOSE, CAPILLARY     Status: Abnormal   Collection Time    03/08/13  6:34 PM      Result Value Range   Glucose-Capillary 121 (*) 70 - 99 mg/dL   Comment 1 Notify RN    GLUCOSE, CAPILLARY     Status: Abnormal   Collection Time    03/08/13 10:37 PM      Result Value Range   Glucose-Capillary 121 (*) 70 - 99 mg/dL  URINE RAPID DRUG SCREEN (HOSP PERFORMED)     Status: Abnormal   Collection Time    03/09/13 12:49 AM      Result Value Range   Opiates POSITIVE (*) NONE DETECTED   Cocaine NONE DETECTED  NONE DETECTED   Benzodiazepines POSITIVE (*) NONE DETECTED   Amphetamines NONE DETECTED  NONE DETECTED   Tetrahydrocannabinol POSITIVE (*) NONE DETECTED   Barbiturates NONE DETECTED  NONE DETECTED   Comment:  DRUG SCREEN FOR MEDICAL  PURPOSES     ONLY.  IF CONFIRMATION IS NEEDED     FOR ANY PURPOSE, NOTIFY LAB     WITHIN 5 DAYS.                LOWEST DETECTABLE LIMITS     FOR URINE DRUG SCREEN     Drug Class       Cutoff (ng/mL)     Amphetamine      1000     Barbiturate      200     Benzodiazepine   200     Tricyclics       300     Opiates          300     Cocaine          300     THC              50  COMPREHENSIVE METABOLIC PANEL     Status: Abnormal   Collection Time    03/09/13  5:55 AM      Result Value Range   Sodium 138  135 - 145 mEq/L   Potassium 4.1  3.5 - 5.1 mEq/L   Comment: HEMOLYSIS AT THIS LEVEL MAY AFFECT RESULT   Chloride 99  96 - 112 mEq/L   CO2 23  19 - 32 mEq/L   Glucose, Bld 123 (*) 70 - 99 mg/dL   BUN 25 (*) 6 - 23 mg/dL   Creatinine, Ser 1.61  0.50 - 1.10 mg/dL   Calcium 09.6  8.4 - 04.5 mg/dL   Total Protein 7.9  6.0 - 8.3 g/dL   Albumin 4.2  3.5 - 5.2 g/dL   AST 96 (*) 0 - 37 U/L   ALT 44 (*) 0 - 35 U/L   Alkaline Phosphatase 71  39 - 117 U/L   Total Bilirubin 0.9  0.3 - 1.2 mg/dL   GFR calc non Af Amer >90  >90 mL/min   GFR calc Af Amer >90  >90 mL/min   Comment:            The eGFR has been calculated     using the CKD EPI equation.     This calculation has not been     validated in all clinical     situations.     eGFR's persistently     <90 mL/min signify     possible Chronic Kidney Disease.  CBC     Status: Abnormal   Collection Time    03/09/13  5:55 AM      Result Value Range   WBC 16.6 (*) 4.0 - 10.5 K/uL   RBC 4.97  3.87 - 5.11 MIL/uL   Hemoglobin 13.9  12.0 - 15.0 g/dL   HCT 40.9  81.1 - 91.4 %   MCV 79.7  78.0 - 100.0 fL   MCH 28.0  26.0 - 34.0 pg   MCHC 35.1  30.0 - 36.0 g/dL   RDW 78.2  95.6 - 21.3 %   Platelets 242  150 - 400 K/uL  GLUCOSE, CAPILLARY     Status: Abnormal   Collection Time    03/09/13  8:07 AM      Result Value Range   Glucose-Capillary 107 (*) 70 - 99 mg/dL  HEPATITIS PANEL, ACUTE     Status: Abnormal   Collection Time     03/09/13 10:13 AM      Result Value Range  Hepatitis B Surface Ag NEGATIVE  NEGATIVE   HCV Ab Reactive (*) NEGATIVE   Comment: (NOTE)                                                                               This test is for screening purposes only.  Reactive results should be     confirmed by an alternative method.  Suggest HCV Qualitative, PCR,     test code 16109.  Specimens will be stable for reflex testing up to 3     days after collection.   Hep A IgM NEGATIVE  NEGATIVE   Hep B C IgM NEGATIVE  NEGATIVE   Comment: (NOTE)     High levels of Hepatitis B Core IgM antibody are detectable     during the acute stage of Hepatitis B. This antibody is used     to differentiate current from past HBV infection.    Ct Abdomen Pelvis W Contrast  03/08/2013  *RADIOLOGY REPORT*  Clinical Data: Abdominal pain, nausea and vomiting.  CT ABDOMEN AND PELVIS WITH CONTRAST  Technique:  Multidetector CT imaging of the abdomen and pelvis was performed following the standard protocol during bolus administration of intravenous contrast.  Contrast: OMNIPAQUE IOHEXOL 300 MG/ML  SOLN  Comparison: Abdominal radiograph performed 02/04/2013, and CT of the abdomen and pelvis performed 12/28/2012; abdominal ultrasound performed 01/01/2013  Findings: Hazy ground-glass opacities are noted at the right lung base; this is suspicious for an acute infectious process.  A tiny hiatal hernia is seen.  There is borderline prominence of the common hepatic duct, measuring 8 mm, and mild distension of the pancreatic duct, measuring 3-4 mm.  A tiny hypodensity at the inferior tail of the liver is nonspecific but may reflect a small cyst.  The gallbladder is within normal limits.  The pancreas and adrenal glands are unremarkable.  The kidneys are unremarkable in appearance.  There is no evidence of hydronephrosis.  No renal or ureteral stones are seen.  No perinephric stranding is appreciated.  No free fluid is identified.  The  small bowel is unremarkable in appearance.  The stomach is within normal limits.  No acute vascular abnormalities are seen.  Mild scattered calcification is noted along the abdominal aorta.  The appendix is normal in caliber and contains contrast, without evidence for appendicitis.  Contrast progresses to the level of the rectum.  The colon is unremarkable in appearance.  The bladder is mildly distended and grossly unremarkable in appearance.  The uterus is within normal limits.  The ovaries are relatively symmetric; no suspicious adnexal masses are seen.  No inguinal lymphadenopathy is seen.  No acute osseous abnormalities are identified.  Facet disease is noted at the lower lumbar spine, with associated grade 1 anterolisthesis of L5 on S1, and mild disc space narrowing.  IMPRESSION:  1.  Multiple areas of hazy ground-glass opacity at the right lung base, suspicious for an acute infectious process. 2.  Borderline prominence of the common hepatic duct to 8 mm, and mild distension of the pancreatic duct to 3-4 mm, without evidence of distal obstruction.  This is slightly more prominent than on the prior study. 3.  Tiny hiatal hernia  seen. 4.  Likely tiny hepatic cyst. 5.  Mild scattered calcification along the abdominal aorta and its branches.   Original Report Authenticated By: Tonia Ghent, M.D.    Dg Chest Port 1 View  03/08/2013  *RADIOLOGY REPORT*  Clinical Data: Abdominal pain.  PORTABLE CHEST - 1 VIEW  Comparison: 02/04/2013.  Findings: Apical lordotic projection.  Cardiopericardial silhouette appears within normal limits. Monitoring leads are projected over the chest.  No airspace disease.  No effusion.  Chest radiograph appears similar to the prior exam.  There is no free air underneath the hemidiaphragms.  Contour abnormality at the right cardiophrenic angle is consistent with eventration of the right hemidiaphragm seen on prior chest CT.  IMPRESSION: No active cardiopulmonary disease.  No free air  underneath the hemidiaphragms in this patient with abdominal pain.   Original Report Authenticated By: Andreas Newport, M.D.     ROS Blood pressure 176/92, pulse 114, temperature 97.3 F (36.3 C), temperature source Oral, resp. rate 15, height 5\' 4"  (1.626 m), weight 68.947 kg (152 lb), SpO2 100.00%. Physical Exam     Mental Status Examination/Evaluation:  Appearance: on bed restless  Eye Contact:: Good  Speech: normal  Volume: Normal  Mood: depressed  Affect: ristricted  Thought Process: organized  Orientation: Full  Thought Content: NO AVH  Suicidal Thoughts: No  Homicidal Thoughts: no  Memory: Recent; Poor  Judgement: Impaired  Insight: Lacking  Psychomotor Activity: Normal  Concentration: Fair  Recall: Fair  Akathisia: No  Assessment:  AXIS I: Depressive d/o nos, anxiety d/o nos, cannabis abuse  AXIS II: Deferred  AXIS III: see emdical hx  ? ? ? ? ? ? ? ? ? ? ? ? ? ? ? AXIS IV: problems related to social environment, problems with access to health care services and problems with primary support group  AXIS V: 45  ?  Treatment Plan/Recommendations:  1.will recommend to increase remeron to 30 mg QHS for anxiety, mood and it also can help with sleep  2. Consider trazodone 50 mg QHS PRN for sleep  2. Will follow as needed   Wonda Cerise 03/09/2013, 3:30 PM

## 2013-03-10 LAB — HCV RNA QUANT: HCV Quantitative: NOT DETECTED IU/mL — ABNORMAL LOW (ref <15)

## 2013-03-10 LAB — COMPREHENSIVE METABOLIC PANEL
Albumin: 3.8 g/dL (ref 3.5–5.2)
BUN: 44 mg/dL — ABNORMAL HIGH (ref 6–23)
Chloride: 97 mEq/L (ref 96–112)
Creatinine, Ser: 0.73 mg/dL (ref 0.50–1.10)
GFR calc non Af Amer: 88 mL/min — ABNORMAL LOW (ref >90)
Total Bilirubin: 1.2 mg/dL (ref 0.3–1.2)

## 2013-03-10 LAB — CBC
MCV: 81.7 fL (ref 78.0–100.0)
Platelets: 272 10*3/uL (ref 150–400)
RBC: 4.8 MIL/uL (ref 3.87–5.11)
WBC: 9.7 10*3/uL (ref 4.0–10.5)

## 2013-03-10 LAB — PROTIME-INR: Prothrombin Time: 14.4 seconds (ref 11.6–15.2)

## 2013-03-10 LAB — CLOSTRIDIUM DIFFICILE BY PCR: Toxigenic C. Difficile by PCR: POSITIVE — AB

## 2013-03-10 MED ORDER — MIRTAZAPINE 30 MG PO TABS
30.0000 mg | ORAL_TABLET | Freq: Every day | ORAL | Status: DC
  Administered 2013-03-10 – 2013-03-11 (×2): 30 mg via ORAL
  Filled 2013-03-10 (×3): qty 1

## 2013-03-10 MED ORDER — TRAZODONE HCL 50 MG PO TABS
50.0000 mg | ORAL_TABLET | Freq: Every day | ORAL | Status: DC
  Administered 2013-03-10 – 2013-03-11 (×2): 50 mg via ORAL
  Filled 2013-03-10 (×3): qty 1

## 2013-03-10 MED ORDER — METRONIDAZOLE 500 MG PO TABS
500.0000 mg | ORAL_TABLET | Freq: Three times a day (TID) | ORAL | Status: DC
  Administered 2013-03-10 – 2013-03-12 (×6): 500 mg via ORAL
  Filled 2013-03-10 (×11): qty 1

## 2013-03-10 MED ORDER — HYDROCODONE-ACETAMINOPHEN 5-325 MG PO TABS
1.0000 | ORAL_TABLET | Freq: Once | ORAL | Status: AC
  Administered 2013-03-10: 1 via ORAL
  Filled 2013-03-10: qty 1

## 2013-03-10 NOTE — Progress Notes (Signed)
PROGRESS NOTE  [name] [name] QMV:[id number] DOB: 1951-03-11 DOA: 03/08/2013 PCP: [name], MD  Brief narrative: 62 yr old female admitted 4.19 with n/v after return ED visit-found to have pancreatic duct dilatation-she has had extensive work-up of her GI symptoms in the past.  SHe was very anxious and disorganized on admission which rap[idly seemed to get better  Past medical history-As per Problem list Chart reviewed as below- Multiple Ed vsits  4/18, 4/12 for abd pain, one 3/18 for hip pain Admission 12/29/12 for Cdiff colitis Seen by [facility] [pulm] and [facility] [cvct] for enlarging RUL nodule-this was resected showing well diff adeno ca  Consultants:  None currently  Procedures:  none  Antibiotics:  none   Subjective  Seems much more calm.  Relates is still "vomiting" although this seems more like ptyalism at bedside with no actual contents to her vomitus-had one episode of stool today which was watery State she still has pain in her epigastric region and wonders whether she needs to have a CT scan    Objective    Telemetry: Will place on tele   Objective: Filed Vitals:   03/09/13 1830 03/09/13 2201 03/10/13 0211 03/10/13 0533  BP: 146/66 105/66 134/69 134/76  Pulse: 112 101 98 99  Temp:  98.3 F (36.8 C) 97.9 F (36.6 C) 98.3 F (36.8 C)  TempSrc:  Oral Oral Oral  Resp:   18 18  Height:      Weight:      SpO2:  100% 100% 98%    Intake/Output Summary (Last 24 hours) at 03/10/13 1306 Last data filed at 03/10/13 1100  Gross per 24 hour  Intake    240 ml  Output    600 ml  Net   -360 ml    Exam:  General: alert AAF frail female Cardiovascular:  s1 s2 tachcyardic Respiratory: clear.   Abdomen: No pain with firm pressure of stethoscope, BS heard.     Data Reviewed: Basic Metabolic Panel:  Recent Labs Lab 03/07/13 1235 03/08/13 0745 03/08/13 0808 03/09/13 0555 03/10/13 0812  NA 139 143  --  138 136  K 3.2* 3.0*  --  4.1 3.1*  CL 99  107  --  99 97  CO2 23  --   --  23 24  GLUCOSE 164* 126*  --  123* 108*  BUN 16 17  --  25* 44*  CREATININE 0.76 0.60  --  0.54 0.73  CALCIUM 10.7*  --   --  10.1 9.9  MG  --   --  1.7  --   --    Liver Function Tests:  Recent Labs Lab 03/07/13 1235 03/08/13 0720 03/09/13 0555 03/10/13 0812  AST 30 59* 96* 69*  ALT 12 24 44* 43*  ALKPHOS 89 74 71 67  BILITOT 0.6 0.7 0.9 1.2  PROT 8.7* 7.9 7.9 7.4  ALBUMIN 4.9 4.4 4.2 3.8    Recent Labs Lab 03/07/13 1235 03/08/13 0720  LIPASE 14 17   No results found for this basename: AMMONIA,  in the last 168 hours CBC:  Recent Labs Lab 03/07/13 1235 03/08/13 0720 03/08/13 0745 03/09/13 0555 03/10/13 0812  WBC 10.3 15.8*  --  16.6* 9.7  NEUTROABS 8.5* 12.5*  --   --   --   HGB 14.8 13.1 13.9 13.9 13.2  HCT 41.8 37.7 41.0 39.6 39.2  MCV 80.1 80.4  --  79.7 81.7  PLT 319 265  --  242 272   Cardiac  Enzymes: No results found for this basename: CKTOTAL, CKMB, CKMBINDEX, TROPONINI,  in the last 168 hours BNP: No components found with this basename: POCBNP,  CBG:  Recent Labs Lab 03/08/13 1834 03/08/13 2237 03/09/13 0807 03/09/13 1656  GLUCAP 121* 121* 107* 99    No results found for this or any previous visit (from the past 240 hour(s)).   Studies:              All Imaging reviewed and is as per above notation   Scheduled Meds: . ALPRAZolam  0.5 mg Oral TID  . aspirin EC  81 mg Oral Daily  . enoxaparin (LOVENOX) injection  40 mg Subcutaneous Q24H  . gabapentin  400 mg Oral TID  . insulin aspart  0-15 Units Subcutaneous TID WC  . insulin aspart  0-5 Units Subcutaneous QHS  . irbesartan  150 mg Oral Daily  . loratadine  10 mg Oral q morning - 10a  . methocarbamol  500 mg Oral BID  . metoprolol tartrate  25 mg Oral BID  . mirtazapine  30 mg Oral QHS  . pantoprazole  40 mg Oral Daily  . polyethylene glycol  17 g Oral Daily  . sodium chloride  3 mL Intravenous Q12H  . traZODone  50 mg Oral QHS   Continuous  Infusions:    Assessment/Plan: 1. Abdominal pain with mild elevation of Transaminases-Thought potentially to be Narcotic/Diabetic bowel.  US done 2/14 doesn't show any evidence of cirrhosis/fatty liver-will cut dilaudid back to 05 q6rn-added tramadol 50 q6prn.  Will followup stool GI pathogen panel sent, C. difficile PCR, although I think this is unlikely. Hepatitis panel shows positive HCV antibody and total antibody-her RNA titers are pending-appreciate GI input-will see in a.m. again 2. Recent Cdiff.  See above 3. S/p Lung resection for adenoCa-stable 4. Bipolar-seems hypomanic.  psychiatry consulted to assist management on 4.20-increased her Xanax 0.5 bid to tid on 4.20.  Continue Remeron 30 mg qhs added trazodone 50 each bedtime-appreciate psychiatry input 5. DM ty 2-A1c 6.1-sugars well controlled on moderate SSI-takes metformin 500 bid at home 6. Htn-continue metoprolol 12.5 bid, Benicar 40 daily,  Avapro 300 daily-HCTZ currently on hold 7. Polysuabstance abuse-Marijuana + on tox screen 8. irreg HR-Rates in 130-140 and sounds irregular.  Keep on tele-continue metoprolol 25 twice a day-sitter increased from 12.5 twice a day  Full No family bedside Inpatient   Pleas Koch, MD  [facility] Pager [phone] 03/10/2013, 1:06 PM    LOS: 2 days

## 2013-03-10 NOTE — Consult Note (Signed)
Reason for Consult: Bipolar Referring Physician: Dr. Earl Lagos is an 62 y.o. female.  HPI: [name] [name] Describe pain as a cramping sensation, persistent, with associate nausea and vomiting. She has had 3 bouts of nonbloody nonbilious vomitus. She also endorsed some mild loose stool without blood or mucus. Patient was recently treated in the hospital for c -diff (12/2012). She came back on 4/19 with similar complaints. She is positive for C. Difficile and waits for treatment.  Patient was seen toady and her daughter was at bed side. Patient has been treated at [facility] in the past. She has been taking her psychiatric medication from PCP. Patient has been depressed, anxious and reports racing thoughts when not taking her medications. She has been diagnosed with bipolar disorder and seizure disorder for the past 15 years since she lived in [place], [place] and she was relocated to [facility] about two years ago. She has better sleep with her  Recent adjustment of her medication and has tolerating well. Patient daughter who was CNA has concern about her taking too many medications. Denies current manic symptoms/SI/HI or psychosis.  Past Medical History  Diagnosis Date  . Hypertension   . Diabetes mellitus   . Bipolar 1 disorder   . Heart murmur   . Sciatica   . Arthritis   . Hepatitis     hep c ?, pt states thinks  . Anxiety   . Diverticulosis   . Chronic low back pain   . DDD (degenerative disc disease), lumbar   . Chronic shoulder pain   . Chronic neck pain     Past Surgical History  Procedure Laterality Date  . Facial tumor removal    . Brain tumor removal    . Brain surgery      in [place]  . Video bronchoscopy  12/04/2012    Procedure: VIDEO BRONCHOSCOPY;  Surgeon: Delight Ovens, MD;  Location: [facility] OR;  Service:  Thoracic;  Laterality: N/A;  . Video assisted thoracoscopy (vats)/wedge resection  12/04/2012    Procedure: VIDEO ASSISTED THORACOSCOPY (VATS)/WEDGE RESECTION;  Surgeon: Delight Ovens, MD;  Location: [facility] OR;  Service: Thoracic;  Laterality: Right;  . Lobectomy  12/04/2012    Procedure: LOBECTOMY;  Surgeon: Delight Ovens, MD;  Location: [facility] OR;  Service: Thoracic;  Laterality: Right;  completion of right upper lobectomy and lymph node disection, placement of on q pump    Family History  Problem Relation Age of Onset  . Hypertension Father     deceased  . Diabetes Father   . Heart disease Father   . Hyperlipidemia Father   . Hypertension Mother   . Hyperlipidemia Mother   . Diabetes Mother   . Hypertension Sister   . Hyperlipidemia Sister   . Hypertension Brother   . Hyperlipidemia Brother     Social History:  reports that she has been smoking Cigarettes.  She has been smoking about 0.00 packs per day for the past 10 years. She has never used smokeless tobacco. She reports that she uses illicit drugs (Hydrocodone and Marijuana). She reports that she does not drink alcohol.  Allergies:  Allergies  Allergen Reactions  . Haldol (Haloperidol Decanoate) Other (See Comments)    tongue sweeling  . Morphine And Related Hives    hives  . Penicillins Rash  Medications: I have reviewed the patient's current medications.  Results for orders placed during the hospital encounter of 03/08/13 (from the past 48 hour(s))  GLUCOSE, CAPILLARY     Status: Abnormal   Collection Time    03/08/13  6:34 PM      Result Value Range   Glucose-Capillary 121 (*) 70 - 99 mg/dL   Comment 1 Notify RN    GLUCOSE, CAPILLARY     Status: Abnormal   Collection Time    03/08/13 10:37 PM      Result Value Range   Glucose-Capillary 121 (*) 70 - 99 mg/dL  URINE RAPID DRUG SCREEN (HOSP PERFORMED)     Status: Abnormal   Collection Time    03/09/13 12:49 AM      Result Value Range   Opiates POSITIVE (*)  NONE DETECTED   Cocaine NONE DETECTED  NONE DETECTED   Benzodiazepines POSITIVE (*) NONE DETECTED   Amphetamines NONE DETECTED  NONE DETECTED   Tetrahydrocannabinol POSITIVE (*) NONE DETECTED   Barbiturates NONE DETECTED  NONE DETECTED   Comment:            DRUG SCREEN FOR MEDICAL PURPOSES     ONLY.  IF CONFIRMATION IS NEEDED     FOR ANY PURPOSE, NOTIFY LAB     WITHIN 5 DAYS.                LOWEST DETECTABLE LIMITS     FOR URINE DRUG SCREEN     Drug Class       Cutoff (ng/mL)     Amphetamine      1000     Barbiturate      200     Benzodiazepine   200     Tricyclics       300     Opiates          300     Cocaine          300     THC              50  COMPREHENSIVE METABOLIC PANEL     Status: Abnormal   Collection Time    03/09/13  5:55 AM      Result Value Range   Sodium 138  135 - 145 mEq/L   Potassium 4.1  3.5 - 5.1 mEq/L   Comment: HEMOLYSIS AT THIS LEVEL MAY AFFECT RESULT   Chloride 99  96 - 112 mEq/L   CO2 23  19 - 32 mEq/L   Glucose, Bld 123 (*) 70 - 99 mg/dL   BUN 25 (*) 6 - 23 mg/dL   Creatinine, Ser 8.65  0.50 - 1.10 mg/dL   Calcium 78.4  8.4 - 69.6 mg/dL   Total Protein 7.9  6.0 - 8.3 g/dL   Albumin 4.2  3.5 - 5.2 g/dL   AST 96 (*) 0 - 37 U/L   ALT 44 (*) 0 - 35 U/L   Alkaline Phosphatase 71  39 - 117 U/L   Total Bilirubin 0.9  0.3 - 1.2 mg/dL   GFR calc non Af Amer >90  >90 mL/min   GFR calc Af Amer >90  >90 mL/min   Comment:            The eGFR has been calculated     using the CKD EPI equation.     This calculation has not been     validated in all clinical     situations.  eGFR's persistently     <90 mL/min signify     possible Chronic Kidney Disease.  CBC     Status: Abnormal   Collection Time    03/09/13  5:55 AM      Result Value Range   WBC 16.6 (*) 4.0 - 10.5 K/uL   RBC 4.97  3.87 - 5.11 MIL/uL   Hemoglobin 13.9  12.0 - 15.0 g/dL   HCT 40.9  81.1 - 91.4 %   MCV 79.7  78.0 - 100.0 fL   MCH 28.0  26.0 - 34.0 pg   MCHC 35.1  30.0 - 36.0  g/dL   RDW 78.2  95.6 - 21.3 %   Platelets 242  150 - 400 K/uL  GLUCOSE, CAPILLARY     Status: Abnormal   Collection Time    03/09/13  8:07 AM      Result Value Range   Glucose-Capillary 107 (*) 70 - 99 mg/dL  HEPATITIS PANEL, ACUTE     Status: Abnormal   Collection Time    03/09/13 10:13 AM      Result Value Range   Hepatitis B Surface Ag NEGATIVE  NEGATIVE   HCV Ab Reactive (*) NEGATIVE   Comment: (NOTE)                                                                               This test is for screening purposes only.  Reactive results should be     confirmed by an alternative method.  Suggest HCV Qualitative, PCR,     test code 08657.  Specimens will be stable for reflex testing up to 3     days after collection.   Hep A IgM NEGATIVE  NEGATIVE   Hep B C IgM NEGATIVE  NEGATIVE   Comment: (NOTE)     High levels of Hepatitis B Core IgM antibody are detectable     during the acute stage of Hepatitis B. This antibody is used     to differentiate current from past HBV infection.  HEPATITIS B CORE ANTIBODY, TOTAL     Status: Abnormal   Collection Time    03/09/13 10:13 AM      Result Value Range   Hep B Core Total Ab POSITIVE (*) NEGATIVE  GLUCOSE, CAPILLARY     Status: None   Collection Time    03/09/13  4:56 PM      Result Value Range   Glucose-Capillary 99  70 - 99 mg/dL  COMPREHENSIVE METABOLIC PANEL     Status: Abnormal   Collection Time    03/10/13  8:12 AM      Result Value Range   Sodium 136  135 - 145 mEq/L   Potassium 3.1 (*) 3.5 - 5.1 mEq/L   Chloride 97  96 - 112 mEq/L   CO2 24  19 - 32 mEq/L   Glucose, Bld 108 (*) 70 - 99 mg/dL   BUN 44 (*) 6 - 23 mg/dL   Creatinine, Ser 8.46  0.50 - 1.10 mg/dL   Calcium 9.9  8.4 - 96.2 mg/dL   Total Protein 7.4  6.0 - 8.3 g/dL   Albumin 3.8  3.5 - 5.2 g/dL  AST 69 (*) 0 - 37 U/L   ALT 43 (*) 0 - 35 U/L   Alkaline Phosphatase 67  39 - 117 U/L   Total Bilirubin 1.2  0.3 - 1.2 mg/dL   GFR calc non Af Amer 88 (*) >90  mL/min   GFR calc Af Amer >90  >90 mL/min   Comment:            The eGFR has been calculated     using the CKD EPI equation.     This calculation has not been     validated in all clinical     situations.     eGFR's persistently     <90 mL/min signify     possible Chronic Kidney Disease.  CBC     Status: None   Collection Time    03/10/13  8:12 AM      Result Value Range   WBC 9.7  4.0 - 10.5 K/uL   RBC 4.80  3.87 - 5.11 MIL/uL   Hemoglobin 13.2  12.0 - 15.0 g/dL   HCT 16.1  09.6 - 04.5 %   MCV 81.7  78.0 - 100.0 fL   MCH 27.5  26.0 - 34.0 pg   MCHC 33.7  30.0 - 36.0 g/dL   RDW 40.9  81.1 - 91.4 %   Platelets 272  150 - 400 K/uL  PROTIME-INR     Status: None   Collection Time    03/10/13  8:12 AM      Result Value Range   Prothrombin Time 14.4  11.6 - 15.2 seconds   INR 1.14  0.00 - 1.49    No results found.  ROS Blood pressure 134/83, pulse 92, temperature 97.2 F (36.2 C), temperature source Oral, resp. rate 18, height 5\' 4"  (1.626 m), weight 152 lb (68.947 kg), SpO2 96.00%. Physical Exam     Mental Status Examination/Evaluation:  Appearance: lying down on her back and her sister is at bed side  Eye Contact:: Good  Speech: normal  Volume: Normal  Mood: depressed and anxious  Affect: ristricted  Thought Process: organized  Orientation: Full  Thought Content: NO AVH  Suicidal Thoughts: No  Homicidal Thoughts: no  Memory: Recent; Poor  Judgement: Impaired  Insight: fair to poor  Psychomotor Activity: Normal  Concentration: Fair  Recall: Fair  Akathisia: No  Assessment:  AXIS I: Depressive d/o nos, anxiety d/o nos, cannabis abuse  AXIS II: Deferred  AXIS III: see emdical hx ? ? AXIS IV: problems related to social environment, problems with access to health care services and problems with primary support group  AXIS V: 45   Treatment Plan/Recommendations:  1. Continue neurontin 400 mg TID,   2. Continue remeron 30 mg QHS  for anxiety, mood and insomnia   3. Continue trazodone 50 mg QHS PRN for sleep  4. Will follow as needed   Nawal Burling,JANARDHAHA R. 03/10/2013, 1:22 PM

## 2013-03-11 ENCOUNTER — Other Ambulatory Visit: Payer: Self-pay | Admitting: *Deleted

## 2013-03-11 LAB — GI PATHOGEN PANEL BY PCR, STOOL
C difficile toxin A/B: POSITIVE
Campylobacter by PCR: NEGATIVE
Cryptosporidium by PCR: NEGATIVE
E coli (ETEC) LT/ST: NEGATIVE
E coli (STEC): NEGATIVE
E coli 0157 by PCR: NEGATIVE

## 2013-03-11 LAB — GLUCOSE, CAPILLARY
Glucose-Capillary: 122 mg/dL — ABNORMAL HIGH (ref 70–99)
Glucose-Capillary: 117 mg/dL — ABNORMAL HIGH (ref 70–99)
Glucose-Capillary: 114 mg/dL — ABNORMAL HIGH (ref 70–99)
Glucose-Capillary: 123 mg/dL — ABNORMAL HIGH (ref 70–99)

## 2013-03-11 MED ORDER — HYDROCODONE-ACETAMINOPHEN 5-325 MG PO TABS
1.0000 | ORAL_TABLET | Freq: Once | ORAL | Status: AC
  Administered 2013-03-11: 1 via ORAL
  Filled 2013-03-11: qty 1

## 2013-03-11 NOTE — Progress Notes (Signed)
PROGRESS NOTE  [name] [name] ZOX:[id number] DOB: Aug 15, 1951 DOA: 03/08/2013 PCP: [name], MD  Brief narrative: 62 yr old female admitted 4.19 with n/v after return ED visit-found to have pancreatic duct dilatation-she has had extensive work-up of her GI symptoms in the past.  SHe was very anxious and disorganized on admission which rapidly seemed to get better  Past medical history-As per Problem list Chart reviewed as below- Multiple Ed vsits  4/18, 4/12 for abd pain, one 3/18 for hip pain Admission 12/29/12 for Cdiff colitis Seen by [facility] [pulm] and gerhardt [cvct] for enlarging RUL nodule-this was resected showing well diff adeno ca  Consultants:  None currently  Procedures:  none  Antibiotics:  none   Subjective  Seems much more calm.  2 watery stools today.  No n/v however.  Tolerating diet. States she has taken Dexilant in the past as her GERD is very bad.  Understands need to hold It currently    Objective    Telemetry: Will place on tele   Objective: Filed Vitals:   03/10/13 1310 03/10/13 2100 03/11/13 0448 03/11/13 1333  BP: 134/83 96/66 114/54 122/67  Pulse: 92 98 97 85  Temp: 97.2 F (36.2 C) 98.7 F (37.1 C) 99.1 F (37.3 C) 98.7 F (37.1 C)  TempSrc: Oral Oral Oral Oral  Resp: 18 16 15 14   Height:      Weight:      SpO2: 96% 96% 98% 96%    Intake/Output Summary (Last 24 hours) at 03/11/13 1342 Last data filed at 03/11/13 0502  Gross per 24 hour  Intake    800 ml  Output    200 ml  Net    600 ml    Exam:  General: alert AAF frail female Cardiovascular:  s1 s2 tachcyardic Respiratory: clear.   Abdomen: No pain with firm pressure of stethoscope, BS heard.     Data Reviewed: Basic Metabolic Panel:  Recent Labs Lab 03/07/13 1235 03/08/13 0745 03/08/13 0808 03/09/13 0555 03/10/13 0812  NA 139 143  --  138 136  K 3.2* 3.0*  --  4.1 3.1*  CL 99 107  --  99 97  CO2 23  --   --  23 24  GLUCOSE 164* 126*  --  123* 108*   BUN 16 17  --  25* 44*  CREATININE 0.76 0.60  --  0.54 0.73  CALCIUM 10.7*  --   --  10.1 9.9  MG  --   --  1.7  --   --    Liver Function Tests:  Recent Labs Lab 03/07/13 1235 03/08/13 0720 03/09/13 0555 03/10/13 0812  AST 30 59* 96* 69*  ALT 12 24 44* 43*  ALKPHOS 89 74 71 67  BILITOT 0.6 0.7 0.9 1.2  PROT 8.7* 7.9 7.9 7.4  ALBUMIN 4.9 4.4 4.2 3.8    Recent Labs Lab 03/07/13 1235 03/08/13 0720  LIPASE 14 17   No results found for this basename: AMMONIA,  in the last 168 hours CBC:  Recent Labs Lab 03/07/13 1235 03/08/13 0720 03/08/13 0745 03/09/13 0555 03/10/13 0812  WBC 10.3 15.8*  --  16.6* 9.7  NEUTROABS 8.5* 12.5*  --   --   --   HGB 14.8 13.1 13.9 13.9 13.2  HCT 41.8 37.7 41.0 39.6 39.2  MCV 80.1 80.4  --  79.7 81.7  PLT 319 265  --  242 272   Cardiac Enzymes: No results found for this basename: CKTOTAL, CKMB, CKMBINDEX,  TROPONINI,  in the last 168 hours BNP: No components found with this basename: POCBNP,  CBG:  Recent Labs Lab 03/10/13 1158 03/10/13 1716 03/10/13 2128 03/11/13 0742 03/11/13 1229  GLUCAP 96 82 156* 117* 114*    Recent Results (from the past 240 hour(s))  CLOSTRIDIUM DIFFICILE BY PCR     Status: Abnormal   Collection Time    03/10/13  1:22 PM      Result Value Range Status   C difficile by pcr POSITIVE (*) NEGATIVE Final   Comment: CRITICAL RESULT CALLED TO, READ BACK BY AND VERIFIED WITH:     Elouise Munroe RN 15:50 03/10/13 (wilsonm)     Studies:              All Imaging reviewed and is as per above notation   Scheduled Meds: . ALPRAZolam  0.5 mg Oral TID  . aspirin EC  81 mg Oral Daily  . enoxaparin (LOVENOX) injection  40 mg Subcutaneous Q24H  . gabapentin  400 mg Oral TID  . insulin aspart  0-15 Units Subcutaneous TID WC  . insulin aspart  0-5 Units Subcutaneous QHS  . irbesartan  150 mg Oral Daily  . loratadine  10 mg Oral q morning - 10a  . methocarbamol  500 mg Oral BID  . metoprolol tartrate  25 mg Oral  BID  . metroNIDAZOLE  500 mg Oral Q8H  . mirtazapine  30 mg Oral QHS  . sodium chloride  3 mL Intravenous Q12H  . traZODone  50 mg Oral QHS   Continuous Infusions:    Assessment/Plan: 1. Abdominal pain with mild elevation of Transaminases-Thought potentially to be Narcotic/Diabetic bowel.  US done 2/14 doesn't show any evidence of cirrhosis/fatty liver-C. diff +-started Flagyl 500 tid.  Hepatitis panel shows positive HCV antibody and total antibody-her RNA titers are pending-informed GI of + results and they will s/o-If stools decrease over 1-2 days could likely d/c home 2. Recent Cdiff.  See above 3. S/p Lung resection for adenoCa-stable 4. Bipolar-seems hypomanic.  psychiatry consulted to assist management on 4.20-increased her Xanax 0.5 bid to tid on 4.20.  Continue Remeron 30 mg qhs added trazodone 50 each bedtime-appreciate psychiatry input 5. DM ty 2-A1c 6.1-sugars 114-156 continue SSI-takes metformin 500 bid at home 6. Htn-continue metoprolol 12.5 bid, Benicar 40 daily,  Avapro 300 daily-HCTZ currently on hold 7. Polysuabstance abuse-Marijuana + on tox screen 8. irreg HR-Rates in 130-140 and sounds irregular.  Keep on tele-continue metoprolol 25 twice a day-sitter increased from 12.5 twice a day  Full No family bedside Inpatient   Pleas Koch, MD  [facility] Pager [phone] 03/11/2013, 1:42 PM    LOS: 3 days

## 2013-03-11 NOTE — Progress Notes (Signed)
Clinical Social Work Department CLINICAL SOCIAL WORK PSYCHIATRY SERVICE LINE ASSESSMENT 03/11/2013  Patient:  [name] [name]  Account:  [id number]  Admit Date:  03/07/2013  Clinical Social Worker:  Salomon Fick, LCSW  Date/Time:  03/11/2013 04:05 PM Referred by:  Physician  Date referred:  03/10/2013 Reason for Referral  Behavioral Health Issues   Presenting Symptoms/Problems (In the person's/family's own words):   "I had stomach pain."    Abuse/Neglect/Trauma Comments:   Psychiatric History (check all that apply)  Outpatient treatment   Psychiatric medications: see medication list. Current Mental Health Hospitalizations/Previous Mental Health History:   Current provider:   Place and Date:   Current Medications:   Previous Impatient Admission/Date/Reason:   Emotional Health / Current Symptoms    Suicide/Self Harm  None reported   Suicide attempt in the past:   Other harmful behavior:   Psychotic/Dissociative Symptoms  None reported   Other Psychotic/Dissociative Symptoms:    Attention/Behavioral Symptoms  Within Normal Limits   Other Attention / Behavioral Symptoms:    Cognitive Impairment  Within Normal Limits   Other Cognitive Impairment:    Mood and Adjustment  Mood Congruent    Stress, Anxiety, Trauma, Any Recent Loss/Stressor  Other - See comment   Anxiety (frequency):   Phobia (specify):   Compulsive behavior (specify):   Obsessive behavior (specify):   Other:   Pt was not sleeping due to medical issues and felt an increase in anxiety related to llness.   Substance Abuse/Use  History of substance use   SBIRT completed (please refer for detailed history):  N  Self-reported substance use:   Pt reports she quit cocaine and alcohol in 2009.   Urinary Drug Screen Completed:   Alcohol level:    Environmental/Housing/Living Arrangement  With Family Member   Who is in the home:   Pt's daughter and her 2 children, ages 12 and 61.   Emergency  contact:  Financial  Medicaid   Patient's Strengths and Goals (patient's own words):   Pt has a strong faith and good family support system .   Clinical Social Worker's Interpretive Summary:   Pt was cheerful and engaging during CSW interview.  She stated this is the first time in a week that she has felt like smiling and joking.  She is physically feeling better and got a full night's sleep last night.  Pt acknowledges history of Bipolar Disorder and recognizes the need to regularly take her medication and states that she is compliant.  Pt states she has not had moods that are "too high or too low" in recent past and understands the importance of maintaining a balanced middle ground.  Pt also acknowledged that she could benefit from counseiling at times when thingss get stressful at home.  CSW made plan with pt for her to reingage in counseling at [facility].  Pt states she plans to have a "walk-in" assessment at [facility] so she will be assigned a Veterinary surgeon.   Disposition:  Outpatient referral made/needed

## 2013-03-12 DIAGNOSIS — A0472 Enterocolitis due to Clostridium difficile, not specified as recurrent: Principal | ICD-10-CM

## 2013-03-12 DIAGNOSIS — F411 Generalized anxiety disorder: Secondary | ICD-10-CM

## 2013-03-12 DIAGNOSIS — F319 Bipolar disorder, unspecified: Secondary | ICD-10-CM

## 2013-03-12 LAB — GLUCOSE, CAPILLARY: Glucose-Capillary: 143 mg/dL — ABNORMAL HIGH (ref 70–99)

## 2013-03-12 MED ORDER — TRAZODONE HCL 50 MG PO TABS: 50.0000 mg | ORAL_TABLET | Freq: Every day | ORAL | Status: DC

## 2013-03-12 MED ORDER — METRONIDAZOLE 500 MG PO TABS: 500.0000 mg | ORAL_TABLET | Freq: Three times a day (TID) | ORAL | Status: DC

## 2013-03-12 MED ORDER — ALPRAZOLAM 0.5 MG PO TABS: 0.5000 mg | ORAL_TABLET | Freq: Three times a day (TID) | ORAL | Status: DC

## 2013-03-12 MED ORDER — HYDROCODONE-ACETAMINOPHEN 5-325 MG PO TABS: 1.0000 | ORAL_TABLET | Freq: Four times a day (QID) | ORAL | Status: DC | PRN

## 2013-03-12 MED ORDER — METOPROLOL TARTRATE 25 MG PO TABS: 25.0000 mg | ORAL_TABLET | Freq: Two times a day (BID) | ORAL | Status: DC

## 2013-03-12 NOTE — Discharge Summary (Signed)
Physician Discharge Summary  [name] [name] AOZ:[id number] DOB: 1951-04-22 DOA: 03/08/2013  PCP: [name], MD  Admit date: 03/08/2013 Discharge date: 03/12/2013  Recommendations for Outpatient Follow-up:  1. Follow up with Dr. [name] within 2 weeks as outpatient for HCV, HBV, pancreatic duct dilation.  Given 1 month supply of flagyl as first recurrence and having ongoing diarrhea, but last of 14 day course would be 5/6.  May need to extend course but will need reevaluation in about 2 weeks to determine length of therapy.   2. Follow up with Behavioral health for bipolar disorder and substance abuse problems. 3. Follow up with primary care doctor for management of chronic pain, sinus arrhythmia.    Discharge Diagnoses:  Active Problems:   Bipolar disorder   Enteritis due to Clostridium difficile   Nausea with vomiting   Abdominal pain, epigastric   Discharge Condition: stable, improved  Diet recommendation: regular  Wt Readings from Last 3 Encounters:  03/08/13 68.947 kg (152 lb)  12/30/12 69.4 kg (153 lb)  12/26/12 69.4 kg (153 lb)    History of present illness:   [name] [name] is a 62 y.o. female  Who was recently treated in the hospital for c -diff (12/2012). [name] [name] Describe pain as a cramping sensation, persistent, with associate nausea and vomiting. She has had 3 bouts of nonbloody nonbilious vomitus. She also endorsed some mild loose stool without blood or mucus. She was d/c'd home. She came back on 4/19 with similar complaints. A CT scan was obtained in the ER and it showed a Borderline prominence of the common hepatic duct to 8 mm, and mild distension of the pancreatic duct to 3-4 mm, without evidence of distal obstruction. GI was consulted in the ER. She was found to have a low K level, it was replaced.  Patient was given 1 dose of Invanz and hospitalist were called for the admission.  No cough/fever or chills  +diarrhea and hard stools mixed together per patient   Hospital Course:   1. Abdominal pain with mild elevation of Transaminases.  Thought potentially to be Narcotic/Diabetic bowel. US done 2/14 did not show any evidence of cirrhosis/fatty liver.  Her C. Diff was positive and she was started on Flagyl 500 tid for 1st recurrence of C. diff.  She continued to have watery diarrhea, but anticipate a slow transition back to more formed stools.  Additionally, her hepatitis panel shows positive HCV antibody with negative RNA titer.  Her Hepatitis B core ab was positive, surface ag negative, and surface Ab was not sent.  Patient should follow up with GI in 1-2 weeks for follow up of her C. 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  She was tolerating a healthy heart diet at the time of discharge.    2. S/p Lung resection for adenoCa-stable and should follow up with Pulmonology as an outpatient.   3. Bipolar-seems hypomanic. psychiatry consulted to assist management on 4.20.  They increased her Xanax 0.5 bid to tid on 4.20.  She continude Remeron 30 mg qhs to which trazodone 50mg  QHS was added.  She should follow up with behavioral health as an outpatient.  4. DM ty 2-A1c 6.1-sugars 114-156 over last 24 hours. continue SSI-takes metformin 500 bid at home  5. Htn-continue metoprolol 12.5  bid, Benicar 40 daily, Avapro 300 daily-HCTZ currently on hold 6. Polysuabstance abuse-Marijuana + on tox screen.  Admits to alcohol and cocaine use also.   7. Irreg HR:  Has sinus arrhythmia intermittently on telemetry, but rate has been generally controlled for the last 24 hours on the increased metoprolol.  She should follow up with her primary care doctor for repeat ECG and thyroid function tests when she is improved from her recent illness.        Consultants:  GI, Dr. Dulce Sellar Psych, Dr. Ennis Forts Procedures:  none Antibiotics:  Flagyl 4/21 >> tentative d/c on 5/6 (14 days if symptoms resolved)     Discharge Exam: Filed Vitals:   03/12/13 0601  BP: 151/78  Pulse: 80  Temp: 97.8 F (36.6 C)  Resp: 16   Filed Vitals:   03/11/13 0448 03/11/13 1333 03/11/13 2141 03/12/13 0601  BP: 114/54 122/67 120/70 151/78  Pulse: 97 85 87 80  Temp: 99.1 F (37.3 C) 98.7 F (37.1 C) 99.1 F (37.3 C) 97.8 F (36.6 C)  TempSrc: Oral Oral Oral Oral  Resp: 15 14 16 16   Height:      Weight:      SpO2: 98% 96% 97% 98%   Patient states that she has had 5 diarrheal bowel movements with mixed formed and liquid stools.  Her abdominal pain is worse.  She is eating okay.  Denies fevers, chills, difficulty urinating, difficulty breathing.    General: AAF, no acute distress, moving around and then sitting up in bed without distress or apparent pain HEENT:  PERRL, NCAT, MMM Cardiovascular: RRR, no mrg, 2+ pulses, warm extremites Respiratory: CTAB ABD:  Normal to mildly hyperactive BS, soft, nondistended, nontender when patient is distracted with conversation and with palpation via stethoscope to deep palpation with no rebound or guarding.    Discharge Instructions      Discharge Orders   Future Appointments Provider Department Dept Phone   03/13/2013 10:00 AM Delight Ovens, MD [facility] [phone]   Future Orders Complete By Expires     Call MD for:  difficulty breathing, headache or visual disturbances  As directed     Call MD for:  extreme fatigue  As directed     Call MD for:  hives  As directed     Call MD for:  persistant dizziness or light-headedness  As directed     Call MD for:  persistant nausea and vomiting  As directed     Call MD for:  severe uncontrolled pain  As directed     Call MD for:  temperature >100.4  As directed     Diet - low sodium heart healthy   As directed     Discharge instructions  As directed     Comments:      You were admitted with nausea, vomiting, diarrhea, and were found to have C. Diff diarrhea.  You have also had infections with hepatitis B and C.  You were seen by the gastroenterologists, Dr. Dulce Sellar, who recommended you continue flagyl and follow up with your primary gastroenterologist, Dr. [name], to discuss these problems.  You also had an intermittent fast heart rate.  Sometimes, this can be caused by dehydration, so your diuretic HCTZ was discontinued and you were given IV fluids.  Your metoprolol dose was increased to keep your heart rate from increasing so much.  Please follow up with your primary care doctor to discuss this issue and to have  some additional testing done when you are feeling better.  Please talk to your doctor about your abdominal pain also.  Finally, please follow up with behavioral health regarding your bipolar disorder and substance abuse.    Increase activity slowly  As directed         Medication List    STOP taking these medications       hydrochlorothiazide 25 MG tablet  Commonly known as:  HYDRODIURIL     ibuprofen 400 MG tablet  Commonly known as:  ADVIL,MOTRIN     metoCLOPramide 5 MG tablet  Commonly known as:  REGLAN     oxyCODONE-acetaminophen 5-325 MG per tablet  Commonly known as:  PERCOCET/ROXICET     polyethylene glycol packet  Commonly known as:  MIRALAX / GLYCOLAX      TAKE these medications       albuterol 108 (90 BASE) MCG/ACT inhaler  Commonly known as:  PROVENTIL HFA;VENTOLIN HFA  Inhale 2 puffs into the lungs every 6 (six) hours as needed (for shortness of breath). For shortness of breath     ALPRAZolam 0.5 MG tablet  Commonly known as:  XANAX  Take 1 tablet (0.5 mg total) by mouth 3 (three) times daily.     aspirin EC 81 MG tablet  Take 81 mg by mouth daily.     DEXILANT 60 MG capsule  Generic drug:  dexlansoprazole  Take 60 mg by mouth every morning.      dicyclomine 20 MG tablet  Commonly known as:  BENTYL  Take 1 tablet (20 mg total) by mouth 2 (two) times daily.     ergocalciferol 50000 UNITS capsule  Commonly known as:  VITAMIN D2  Take 50,000 Units by mouth every Monday.     gabapentin 400 MG capsule  Commonly known as:  NEURONTIN  Take 400 mg by mouth 3 (three) times daily.     HYDROcodone-acetaminophen 5-325 MG per tablet  Commonly known as:  NORCO/VICODIN  Take 1 tablet by mouth every 6 (six) hours as needed for pain.     loratadine 10 MG tablet  Commonly known as:  CLARITIN  Take 10 mg by mouth every morning.     metFORMIN 500 MG tablet  Commonly known as:  GLUCOPHAGE  Take 500 mg by mouth 2 (two) times daily with a meal.     methocarbamol 500 MG tablet  Commonly known as:  ROBAXIN  Take 1 tablet (500 mg total) by mouth 2 (two) times daily.     metoprolol tartrate 25 MG tablet  Commonly known as:  LOPRESSOR  Take 1 tablet (25 mg total) by mouth 2 (two) times daily.     metroNIDAZOLE 500 MG tablet  Commonly known as:  FLAGYL  Take 1 tablet (500 mg total) by mouth every 8 (eight) hours.     mirtazapine 15 MG tablet  Commonly known as:  REMERON  Take 15 mg by mouth at bedtime.     olmesartan 40 MG tablet  Commonly known as:  BENICAR  Take 40 mg by mouth daily.     ondansetron 4 MG tablet  Commonly known as:  ZOFRAN  Take 1 tablet (4 mg total) by mouth every 6 (six) hours.     simvastatin 20 MG tablet  Commonly known as:  ZOCOR  Take 20 mg by mouth every evening.     traZODone 50 MG tablet  Commonly known as:  DESYREL  Take 1 tablet (50 mg total) by mouth at bedtime.  Follow-up Information   Follow up with [name], MD In 1 week.   Contact information:   [address] [place] [address] [phone]       Follow up with EDWARDS JR,JAMES L, MD. Schedule an appointment as soon as possible for a visit in 2 weeks.   Contact information:   [address]                          [place] [address] [phone]       Follow up with [facility]. Call in 1 week.   Contact information:   [address] [place] [address] [phone]      The results of significant diagnostics from this hospitalization (including imaging, microbiology, ancillary and laboratory) are listed below for reference.    Significant Diagnostic Studies: Ct Abdomen Pelvis W Contrast  03/08/2013  *RADIOLOGY REPORT*  Clinical Data: Abdominal pain, nausea and vomiting.  CT ABDOMEN AND PELVIS WITH CONTRAST  Technique:  Multidetector CT imaging of the abdomen and pelvis was performed following the standard protocol during bolus administration of intravenous contrast.  Contrast: OMNIPAQUE IOHEXOL 300 MG/ML  SOLN  Comparison: Abdominal radiograph performed 02/04/2013, and CT of the abdomen and pelvis performed 12/28/2012; abdominal ultrasound performed 01/01/2013  Findings: Hazy ground-glass opacities are noted at the right lung base; this is suspicious for an acute infectious process.  A tiny hiatal hernia is seen.  There is borderline prominence of the common hepatic duct, measuring 8 mm, and mild distension of the pancreatic duct, measuring 3-4 mm.  A tiny hypodensity at the inferior tail of the liver is nonspecific but may reflect a small cyst.  The gallbladder is within normal limits.  The pancreas and adrenal glands are unremarkable.  The kidneys are unremarkable in appearance.  There is no evidence of hydronephrosis.  No renal or ureteral stones are seen.  No perinephric stranding is appreciated.  No free fluid is identified.  The small bowel is unremarkable in appearance.  The stomach is within normal limits.  No acute vascular abnormalities are seen.  Mild scattered calcification is noted along the abdominal aorta.  The appendix is normal in caliber and contains contrast, without evidence for appendicitis.  Contrast  progresses to the level of the rectum.  The colon is unremarkable in appearance.  The bladder is mildly distended and grossly unremarkable in appearance.  The uterus is within normal limits.  The ovaries are relatively symmetric; no suspicious adnexal masses are seen.  No inguinal lymphadenopathy is seen.  No acute osseous abnormalities are identified.  Facet disease is noted at the lower lumbar spine, with associated grade 1 anterolisthesis of L5 on S1, and mild disc space narrowing.  IMPRESSION:  1.  Multiple areas of hazy ground-glass opacity at the right lung base, suspicious for an acute infectious process. 2.  Borderline prominence of the common hepatic duct to 8 mm, and mild distension of the pancreatic duct to 3-4 mm, without evidence of distal obstruction.  This is slightly more prominent than on the prior study. 3.  Tiny hiatal hernia seen. 4.  Likely tiny hepatic cyst. 5.  Mild scattered calcification along the abdominal aorta and its branches.   Original Report Authenticated By: Tonia Ghent, M.D.    Dg Chest Port 1 View  03/08/2013  *RADIOLOGY REPORT*  Clinical Data: Abdominal pain.  PORTABLE CHEST - 1 VIEW  Comparison: 02/04/2013.  Findings: Apical lordotic projection.  Cardiopericardial silhouette appears within normal limits. Monitoring leads are projected over the chest.  No airspace disease.  No effusion.  Chest radiograph appears similar to the prior exam.  There is no free air underneath the hemidiaphragms.  Contour abnormality at the right cardiophrenic angle is consistent with eventration of the right hemidiaphragm seen on prior chest CT.  IMPRESSION: No active cardiopulmonary disease.  No free air underneath the hemidiaphragms in this patient with abdominal pain.   Original Report Authenticated By: Andreas Newport, M.D.     Microbiology: Recent Results (from the past 240 hour(s))  CLOSTRIDIUM DIFFICILE BY PCR     Status: Abnormal   Collection Time    03/10/13  1:22 PM      Result  Value Range Status   C difficile by pcr POSITIVE (*) NEGATIVE Final   Comment: CRITICAL RESULT CALLED TO, READ BACK BY AND VERIFIED WITH:     CAnola Gurney RN 15:50 03/10/13 ([email or web address])     Labs: Basic Metabolic Panel:  Recent Labs Lab 03/07/13 1235 03/08/13 0745 03/08/13 0808 03/09/13 0555 03/10/13 0812  NA 139 143  --  138 136  K 3.2* 3.0*  --  4.1 3.1*  CL 99 107  --  99 97  CO2 23  --   --  23 24  GLUCOSE 164* 126*  --  123* 108*  BUN 16 17  --  25* 44*  CREATININE 0.76 0.60  --  0.54 0.73  CALCIUM 10.7*  --   --  10.1 9.9  MG  --   --  1.7  --   --    Liver Function Tests:  Recent Labs Lab 03/07/13 1235 03/08/13 0720 03/09/13 0555 03/10/13 0812  AST 30 59* 96* 69*  ALT 12 24 44* 43*  ALKPHOS 89 74 71 67  BILITOT 0.6 0.7 0.9 1.2  PROT 8.7* 7.9 7.9 7.4  ALBUMIN 4.9 4.4 4.2 3.8    Recent Labs Lab 03/07/13 1235 03/08/13 0720  LIPASE 14 17   No results found for this basename: AMMONIA,  in the last 168 hours CBC:  Recent Labs Lab 03/07/13 1235 03/08/13 0720 03/08/13 0745 03/09/13 0555 03/10/13 0812  WBC 10.3 15.8*  --  16.6* 9.7  NEUTROABS 8.5* 12.5*  --   --   --   HGB 14.8 13.1 13.9 13.9 13.2  HCT 41.8 37.7 41.0 39.6 39.2  MCV 80.1 80.4  --  79.7 81.7  PLT 319 265  --  242 272   Cardiac Enzymes: No results found for this basename: CKTOTAL, CKMB, CKMBINDEX, TROPONINI,  in the last 168 hours BNP: BNP (last 3 results) No results found for this basename: PROBNP,  in the last 8760 hours CBG:  Recent Labs Lab 03/11/13 0742 03/11/13 1229 03/11/13 1710 03/11/13 2158 03/12/13 0742  GLUCAP 117* 114* 123* 139* 143*    Time coordinating discharge: 45 minutes  Signed:  Mahkayla Preece  [facility] 03/12/2013, 11:47 AM

## 2013-03-12 NOTE — Progress Notes (Signed)
Discharge patient, home discharge instruction given, no questions verbalized.

## 2013-03-12 NOTE — Clinical Social Work Psych Note (Signed)
Psych CSW signing off.  No further psych needs identified.  Please re-consult as needed.    Vickii Penna, LCSWA [phone]  Clinical Social Work

## 2013-03-13 ENCOUNTER — Encounter: Payer: Self-pay | Admitting: Cardiothoracic Surgery

## 2013-03-13 ENCOUNTER — Ambulatory Visit
Admission: RE | Admit: 2013-03-13 | Discharge: 2013-03-13 | Disposition: A | Discharge status: Home / Self Care | Payer: Medicaid Other | Source: Ambulatory Visit | Attending: Cardiothoracic Surgery | Admitting: Cardiothoracic Surgery

## 2013-03-13 ENCOUNTER — Ambulatory Visit (INDEPENDENT_AMBULATORY_CARE_PROVIDER_SITE_OTHER): Payer: Medicaid Other | Admitting: Cardiothoracic Surgery

## 2013-03-13 VITALS — BP 101/94 | HR 100 | Resp 20 | Ht 64.0 in | Wt 152.0 lb

## 2013-03-13 DIAGNOSIS — C3410 Malignant neoplasm of upper lobe, unspecified bronchus or lung: Secondary | ICD-10-CM

## 2013-03-13 DIAGNOSIS — Z9889 Other specified postprocedural states: Secondary | ICD-10-CM

## 2013-03-13 DIAGNOSIS — Z09 Encounter for follow-up examination after completed treatment for conditions other than malignant neoplasm: Secondary | ICD-10-CM

## 2013-03-13 DIAGNOSIS — Z902 Acquired absence of lung [part of]: Secondary | ICD-10-CM

## 2013-03-13 NOTE — Patient Instructions (Addendum)
Chest xray looks good Return in 3 months for ct scan of the chest

## 2013-03-13 NOTE — Progress Notes (Signed)
[address]       [name] [name] [address]             [phone]        [name] [name] [facility] Medical Record #[id number] Date of Birth: 06/05/1951  [name], MD [name], MD  Chief Complaint:   PostOp Follow Up Visit (3 month follow up)  12/04/2012  OPERATIVE REPORT  PREOPERATIVE DIAGNOSIS: Slowly enlarging right upper lobe lung nodule.  POSTOPERATIVE DIAGNOSIS: Carcinoma of the right upper lobe.  PROCEDURE PERFORMED: Bronchoscopy, right video-assisted thoracoscopy,  mini thoracotomy, wedge resection, right upper lobe and completion right  upper lobectomy with lymph node sampling, placement of On-Q device.   PATH:WELL DIFFERENTIATED ADENOCARCINOMA  Of Lung WITH ABUNDANT MUCIN, pT1a,pN0, cM0  History of Present Illness:     Not smoking, just released from the hospital for abdominal pain and dx of Cdiff. Eating better this am. Still some loose stools but better today No pulmonary symptoms   History  Smoking status  . Current Some Day Smoker -- 10 years  . Types: Cigarettes  Smokeless tobacco  . Never Used    Comment: Smokes 1/2 pack per week       Allergies  Allergen Reactions  . Haldol (Haloperidol Decanoate) Other (See Comments)    tongue sweeling  . Morphine And Related Hives    hives  . Penicillins Rash    Current Outpatient Prescriptions  Medication Sig Dispense Refill  . albuterol (PROVENTIL HFA;VENTOLIN HFA) 108 (90 BASE) MCG/ACT inhaler Inhale 2 puffs into the lungs every 6 (six) hours as needed (for shortness of breath). For shortness of breath      . ALPRAZolam (XANAX) 0.5 MG tablet Take 1 tablet (0.5 mg total) by mouth 3 (three) times daily.  90 tablet  0  . aspirin EC 81 MG tablet Take 81 mg by mouth daily.      Marland Kitchen dexlansoprazole (DEXILANT) 60 MG capsule Take 60 mg by mouth every morning.       . dicyclomine (BENTYL) 20 MG tablet Take 1 tablet (20 mg total) by mouth 2 (two)  times daily.  20 tablet  0  . ergocalciferol (VITAMIN D2) 50000 UNITS capsule Take 50,000 Units by mouth every Monday.      . gabapentin (NEURONTIN) 400 MG capsule Take 400 mg by mouth 3 (three) times daily.       Marland Kitchen HYDROcodone-acetaminophen (NORCO/VICODIN) 5-325 MG per tablet Take 1 tablet by mouth every 6 (six) hours as needed for pain.  30 tablet  0  . loratadine (CLARITIN) 10 MG tablet Take 10 mg by mouth every morning.       . metFORMIN (GLUCOPHAGE) 500 MG tablet Take 500 mg by mouth 2 (two) times daily with a meal.       . methocarbamol (ROBAXIN) 500 MG tablet Take 1 tablet (500 mg total) by mouth 2 (two) times daily.  20 tablet  0  . metoprolol tartrate (LOPRESSOR) 25 MG tablet Take 1 tablet (25 mg total) by mouth 2 (two) times daily.  60 tablet  0  . metroNIDAZOLE (FLAGYL) 500 MG tablet Take 1 tablet (500 mg total) by mouth every 8 (eight) hours.  90 tablet  0  . mirtazapine (REMERON) 15 MG tablet Take 15 mg by  mouth at bedtime.      Marland Kitchen olmesartan (BENICAR) 40 MG tablet Take 40 mg by mouth daily.      . ondansetron (ZOFRAN) 4 MG tablet Take 1 tablet (4 mg total) by mouth every 6 (six) hours.  12 tablet  0  . simvastatin (ZOCOR) 20 MG tablet Take 20 mg by mouth every evening.      . traZODone (DESYREL) 50 MG tablet Take 1 tablet (50 mg total) by mouth at bedtime.  30 tablet  0   No current facility-administered medications for this visit.       Physical Exam: BP 101/94  Pulse 100  Resp 20  Ht 5\' 4"  (1.626 m)  Wt 152 lb (68.947 kg)  BMI 26.08 kg/m2  SpO2 98%  General appearance: alert and cooperative Neurologic: intact Heart: regular rate and rhythm, S1, S2 normal, no murmur, click, rub or gallop Lungs: clear to auscultation bilaterally and normal percussion bilaterally Abdomen: soft, non-tender; bowel sounds normal; no masses,  no organomegaly Extremities: extremities normal, atraumatic, no cyanosis or edema and Homans sign is negative, no sign of DVT Wound: well healed rt  chest sites   Diagnostic Studies & Laboratory data:         Recent Radiology Findings: Dg Chest 2 View  03/13/2013  *RADIOLOGY REPORT*  Clinical Data: Lung cancer.  Right lobectomy.  CHEST - 2 VIEW  Comparison: 03/08/2013  Findings: Postop lobectomy on the right.  Elevated right hemidiaphragm is unchanged.  No recurrent mass lesion.  Negative for heart failure or effusion.  Negative for pneumonia.  IMPRESSION: Stable chest x-ray following lobectomy on the right.  No acute abnormality.   Original Report Authenticated By: [name], M.D.       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, pT1a,pN0, Mx  Recent Labs: Lab Results  Component Value Date   WBC 9.7 03/10/2013   HGB 13.2 03/10/2013   HCT 39.2 03/10/2013   PLT 272 03/10/2013   GLUCOSE 108* 03/10/2013   ALT 43* 03/10/2013   AST 69* 03/10/2013   NA 136 03/10/2013   K 3.1* 03/10/2013   CL 97 03/10/2013   CREATININE 0.73 03/10/2013   BUN 44* 03/10/2013   CO2 24 03/10/2013   TSH 2.492 01/02/2013   INR 1.14 03/10/2013   HGBA1C 6.1* 03/08/2013      Assessment / Plan:   Patient's approximately 3 months status post right upper lobectomy for stage I well-differentiated mucin producing adenocarcinoma Path was of highly mucin producing lung mass,  raising question of GI malignancy although no evidence on PET scan of abdominal malignancy. Patient has seen Dr [name]  And a colonoscopy was done since her last visit She was just released from [facility] yesterday after being admitted for abdominal pain and found to have C. Difficile.  Presented at [facility] Conference- no additional rx recommended for Lung Ca , follow up only needed -we'll plan on followup visit in 3 months with a CT scan of the chest, which will be approximately 6 months following her original surgery     [name] B 03/13/2013 10:58 AM

## 2013-03-31 ENCOUNTER — Encounter (HOSPITAL_COMMUNITY): Payer: Self-pay | Admitting: *Deleted

## 2013-03-31 ENCOUNTER — Emergency Department (HOSPITAL_COMMUNITY)
Admission: EM | Admit: 2013-03-31 | Discharge: 2013-03-31 | Disposition: A | Discharge status: Home / Self Care | Payer: Medicaid Other | Attending: Emergency Medicine | Admitting: Emergency Medicine

## 2013-03-31 DIAGNOSIS — A0472 Enterocolitis due to Clostridium difficile, not specified as recurrent: Secondary | ICD-10-CM

## 2013-03-31 DIAGNOSIS — R011 Cardiac murmur, unspecified: Secondary | ICD-10-CM | POA: Insufficient documentation

## 2013-03-31 DIAGNOSIS — Z880 Allergy status to penicillin: Secondary | ICD-10-CM | POA: Insufficient documentation

## 2013-03-31 DIAGNOSIS — M25552 Pain in left hip: Secondary | ICD-10-CM

## 2013-03-31 DIAGNOSIS — M5430 Sciatica, unspecified side: Secondary | ICD-10-CM | POA: Insufficient documentation

## 2013-03-31 DIAGNOSIS — M545 Low back pain, unspecified: Secondary | ICD-10-CM | POA: Insufficient documentation

## 2013-03-31 DIAGNOSIS — R209 Unspecified disturbances of skin sensation: Secondary | ICD-10-CM | POA: Insufficient documentation

## 2013-03-31 DIAGNOSIS — Z79899 Other long term (current) drug therapy: Secondary | ICD-10-CM | POA: Insufficient documentation

## 2013-03-31 DIAGNOSIS — Z7982 Long term (current) use of aspirin: Secondary | ICD-10-CM | POA: Insufficient documentation

## 2013-03-31 DIAGNOSIS — M5137 Other intervertebral disc degeneration, lumbosacral region: Secondary | ICD-10-CM | POA: Insufficient documentation

## 2013-03-31 DIAGNOSIS — F411 Generalized anxiety disorder: Secondary | ICD-10-CM | POA: Insufficient documentation

## 2013-03-31 DIAGNOSIS — M51379 Other intervertebral disc degeneration, lumbosacral region without mention of lumbar back pain or lower extremity pain: Secondary | ICD-10-CM | POA: Insufficient documentation

## 2013-03-31 DIAGNOSIS — M25559 Pain in unspecified hip: Secondary | ICD-10-CM | POA: Insufficient documentation

## 2013-03-31 DIAGNOSIS — M129 Arthropathy, unspecified: Secondary | ICD-10-CM | POA: Insufficient documentation

## 2013-03-31 DIAGNOSIS — G8929 Other chronic pain: Secondary | ICD-10-CM

## 2013-03-31 DIAGNOSIS — Z8719 Personal history of other diseases of the digestive system: Secondary | ICD-10-CM | POA: Insufficient documentation

## 2013-03-31 DIAGNOSIS — M25519 Pain in unspecified shoulder: Secondary | ICD-10-CM | POA: Insufficient documentation

## 2013-03-31 DIAGNOSIS — F17200 Nicotine dependence, unspecified, uncomplicated: Secondary | ICD-10-CM | POA: Insufficient documentation

## 2013-03-31 DIAGNOSIS — M79605 Pain in left leg: Secondary | ICD-10-CM

## 2013-03-31 DIAGNOSIS — R197 Diarrhea, unspecified: Secondary | ICD-10-CM | POA: Insufficient documentation

## 2013-03-31 DIAGNOSIS — I10 Essential (primary) hypertension: Secondary | ICD-10-CM

## 2013-03-31 DIAGNOSIS — Z885 Allergy status to narcotic agent status: Secondary | ICD-10-CM | POA: Insufficient documentation

## 2013-03-31 DIAGNOSIS — M542 Cervicalgia: Secondary | ICD-10-CM

## 2013-03-31 DIAGNOSIS — F319 Bipolar disorder, unspecified: Secondary | ICD-10-CM | POA: Insufficient documentation

## 2013-03-31 DIAGNOSIS — M79609 Pain in unspecified limb: Secondary | ICD-10-CM | POA: Insufficient documentation

## 2013-03-31 DIAGNOSIS — E119 Type 2 diabetes mellitus without complications: Secondary | ICD-10-CM

## 2013-03-31 MED ORDER — OXYCODONE-ACETAMINOPHEN 5-325 MG PO TABS: 1.0000 | ORAL_TABLET | Freq: Four times a day (QID) | ORAL | Status: DC | PRN

## 2013-03-31 MED ORDER — HYDROMORPHONE HCL PF 2 MG/ML IJ SOLN
2.0000 mg | Freq: Once | INTRAMUSCULAR | Status: AC
  Administered 2013-03-31: 2 mg via INTRAMUSCULAR
  Filled 2013-03-31: qty 1

## 2013-03-31 MED ORDER — IBUPROFEN 200 MG PO TABS
600.0000 mg | ORAL_TABLET | Freq: Once | ORAL | Status: AC
  Administered 2013-03-31: 600 mg via ORAL
  Filled 2013-03-31: qty 3

## 2013-03-31 NOTE — ED Notes (Signed)
PT is here with left hip pain and lower back pain and was told she has a cyst in her spine and they are planning to deaden nerve.  Pt has had the same pain for over one year.

## 2013-03-31 NOTE — ED Provider Notes (Signed)
History     CSN: [id number]  Arrival date & time 03/31/13  8657   First MD Initiated Contact with Patient 03/31/13 1105      Chief Complaint  Patient presents with  . Hip Pain    (Consider location/radiation/quality/duration/timing/severity/associated sxs/prior treatment) HPI Comments: [name] [name] is a 62 y/o F with PMHx of HTN, DM, Bipolar Disorder type I, sciatica, arthritis, chronic low back pain, chronic neck pain, anxiety, C. Diff that is currently being treated with Flagyl and has follow-up with Dr. Carman Ching on 04/11/2013 presenting to the ED with left hip pain that has been present for the past couple of months - has been seen in the ED numerous times regarding hip discomfort. Patient reported that pain started on Saturday, described as a constant aching sensation with radiation down the entire left leg. Patient reported having numbness to the left heel and pinky/ring/midle toe of the left foot. Patient reported intermittent episodes of tingling running down the left leg. Patient reported that pain has been so bad that she is unable to sleep at night - the only way she is comfortable is when she is laying directly on the left hip. Patient reported that she has pain to the lower leg that is described as a pulling sensation - reported calf pain even at rest. Patient reported that she is able to walk, but reported feeling pain. Denied injury, fall, trauma, chest pain, shortness of breathe difficulty breathing, headaches, dizziness, chills, saddle anesthesia, bowel incontinence, urinary incontinence, dysuria.  Patient currently being treated for C. Diff when diagnosed 03/18/2013 - stated that she continues to have mild diarrhea, but is taking antibiotics (Flagyl) everyday for the infection. Patient reported that she follows up with Dr. Ross Ludwig regarding the discomfort.  Patient reported that she is following up with Dr. Ave Filter, orthopedics, at [facility](?) - stated that she had an  MRI performed on her lower back approximately one month ago - stated that a cyst was found on the scan that is leading to compression on her nerve that is leading to the left leg discomfort. Patient reported that she is taking neurontin and percocets as per patient for the relief. PCP: Dr. Lerry Liner  [name] a 62 y.o. female presenting with hip pain. The history is provided by the patient. No language interpreter was used.  Hip Pain Associated symptoms include arthralgias (left hip and leg pain - chronic), neck pain (chronic) and numbness. Pertinent negatives include no abdominal pain, chest pain, chills, coughing, fatigue, fever, headaches, nausea, rash, sore throat, vomiting or weakness.    Past Medical History  Diagnosis Date  . Hypertension   . Diabetes mellitus   . Bipolar 1 disorder   . Heart murmur   . Sciatica   . Arthritis   . Hepatitis     hep c ?, pt states thinks  . Anxiety   . Diverticulosis   . Chronic low back pain   . DDD (degenerative disc disease), lumbar   . Chronic shoulder pain   . Chronic neck pain     Past Surgical History  Procedure Laterality Date  . Facial tumor removal    . Brain tumor removal    . Brain surgery      in [place]  . Video bronchoscopy  12/04/2012    Procedure: VIDEO BRONCHOSCOPY;  Surgeon: Delight Ovens, MD;  Location: [facility] OR;  Service: Thoracic;  Laterality: N/A;  . Video assisted thoracoscopy (vats)/wedge resection  12/04/2012  Procedure: VIDEO ASSISTED THORACOSCOPY (VATS)/WEDGE RESECTION;  Surgeon: Delight Ovens, MD;  Location: [facility] OR;  Service: Thoracic;  Laterality: Right;  . Lobectomy  12/04/2012    Procedure: LOBECTOMY;  Surgeon: Delight Ovens, MD;  Location: [facility] OR;  Service: Thoracic;  Laterality: Right;  completion of right upper lobectomy and lymph node disection, placement of on q pump    Family History  Problem Relation Age of Onset  . Hypertension Father     deceased  . Diabetes Father   . Heart  disease Father   . Hyperlipidemia Father   . Hypertension Mother   . Hyperlipidemia Mother   . Diabetes Mother   . Hypertension Sister   . Hyperlipidemia Sister   . Hypertension Brother   . Hyperlipidemia Brother     History  Substance Use Topics  . Smoking status: Current Some Day Smoker -- 10 years    Types: Cigarettes  . Smokeless tobacco: Never Used     Comment: Smokes 1/2 pack per week  . Alcohol Use: No     Comment: quit 42yrs ago    OB History   Grav Para Term Preterm Abortions TAB SAB Ect Mult Living                  Review of Systems  Constitutional: Negative for fever, chills and fatigue.  HENT: Positive for neck pain (chronic). Negative for hearing loss, ear pain, sore throat, trouble swallowing, neck stiffness and tinnitus.   Eyes: Negative for photophobia, pain and visual disturbance.  Respiratory: Negative for cough, chest tightness and shortness of breath.   Cardiovascular: Negative for chest pain.  Gastrointestinal: Positive for diarrhea (patient being treated for C. diff). Negative for nausea, vomiting, abdominal pain, constipation and blood in stool.  Genitourinary: Negative for dysuria, hematuria, decreased urine volume and difficulty urinating.  Musculoskeletal: Positive for back pain (chronic) and arthralgias (left hip and leg pain - chronic).  Skin: Negative for rash.  Neurological: Positive for numbness. Negative for dizziness, weakness, light-headedness and headaches.  All other systems reviewed and are negative.    Allergies  Haldol; Morphine and related; and Penicillins  Home Medications   Current Outpatient Rx  Name  Route  Sig  Dispense  Refill  . ALPRAZolam (XANAX) 0.5 MG tablet   Oral   Take 0.5 mg by mouth 3 (three) times daily.         Marland Kitchen aspirin EC 81 MG tablet   Oral   Take 81 mg by mouth daily.         Marland Kitchen dexlansoprazole (DEXILANT) 60 MG capsule   Oral   Take 60 mg by mouth every morning.          . dicyclomine  (BENTYL) 20 MG tablet   Oral   Take 1 tablet (20 mg total) by mouth 2 (two) times daily.   20 tablet   0   . gabapentin (NEURONTIN) 400 MG capsule   Oral   Take 400 mg by mouth 3 (three) times daily.          Marland Kitchen HYDROcodone-acetaminophen (NORCO/VICODIN) 5-325 MG per tablet   Oral   Take 1 tablet by mouth every 6 (six) hours as needed for pain.   30 tablet   0   . Ipratropium-Albuterol (COMBIVENT RESPIMAT) 20-100 MCG/ACT AERS respimat   Inhalation   Inhale 1 puff into the lungs every 6 (six) hours.         Marland Kitchen loratadine (CLARITIN) 10 MG tablet  Oral   Take 10 mg by mouth every morning.          . metFORMIN (GLUCOPHAGE) 500 MG tablet   Oral   Take 500 mg by mouth 2 (two) times daily with a meal.          . methocarbamol (ROBAXIN) 500 MG tablet   Oral   Take 1 tablet (500 mg total) by mouth 2 (two) times daily.   20 tablet   0   . metoprolol tartrate (LOPRESSOR) 25 MG tablet   Oral   Take 1 tablet (25 mg total) by mouth 2 (two) times daily.   60 tablet   0   . metroNIDAZOLE (FLAGYL) 500 MG tablet   Oral   Take 1 tablet (500 mg total) by mouth every 8 (eight) hours.   90 tablet   0   . olmesartan (BENICAR) 40 MG tablet   Oral   Take 40 mg by mouth daily.         . ondansetron (ZOFRAN) 4 MG tablet   Oral   Take 1 tablet (4 mg total) by mouth every 6 (six) hours.   12 tablet   0   . simvastatin (ZOCOR) 20 MG tablet   Oral   Take 20 mg by mouth every evening.         . traZODone (DESYREL) 50 MG tablet   Oral   Take 1 tablet (50 mg total) by mouth at bedtime.   30 tablet   0   . ergocalciferol (VITAMIN D2) 50000 UNITS capsule   Oral   Take 50,000 Units by mouth every Monday.         . mirtazapine (REMERON) 15 MG tablet   Oral   Take 15 mg by mouth at bedtime.           BP 130/78  Pulse 86  Temp(Src) 98.8 F (37.1 C) (Oral)  Resp 16  SpO2 96%  Physical Exam  Nursing note and vitals reviewed. Constitutional: She is oriented to  person, place, and time. She appears well-developed and well-nourished. No distress.  HENT:  Head: Normocephalic and atraumatic.  Mouth/Throat: Oropharynx is clear and moist. No oropharyngeal exudate.  Uvula midline, symmetrical elevation  Eyes: Conjunctivae and EOM are normal. Pupils are equal, round, and reactive to light. Right eye exhibits no discharge. Left eye exhibits no discharge.  Neck: Normal range of motion. Neck supple. No tracheal deviation present.  Negative neck stiffness Negative nuchal rigidity Negative lymphadenopathy   Cardiovascular: Normal rate, regular rhythm and normal heart sounds.   Radial pulses 2+ bilaterally Pedal pulses 2+ bilaterally Negative leg and ankle swelling Negative pitting edema   Pulmonary/Chest: Effort normal and breath sounds normal. No respiratory distress. She has no wheezes. She has no rales.  Abdominal: Soft. Bowel sounds are normal. She exhibits no distension. There is no tenderness. There is no rebound and no guarding.  Musculoskeletal: Normal range of motion. She exhibits tenderness. She exhibits no edema.       Left hip: She exhibits tenderness. She exhibits normal range of motion, normal strength, no bony tenderness, no swelling, no crepitus, no deformity and no laceration.       Lumbar back: She exhibits tenderness. She exhibits normal range of motion, no bony tenderness, no swelling, no edema and no deformity.       Back:       Legs: Full ROM to lower extremities bilaterally Negative erythema, inflammation, swelling, warmth to joint - r/o septic  joint Negative eversion of left foot and leg shortening, negative injury/fall - r/o hip dislocation Mild decreased flexion of the left knee secondary to left hip pain. Full abduction and adduction of the left hip. Full ROM to the left foot and toes.  Strength 5+/5+ to lower extremities bilaterally  Lymphadenopathy:    She has no cervical adenopathy.  Neurological: She is alert and oriented  to person, place, and time. No cranial nerve deficit or sensory deficit. She exhibits normal muscle tone. Coordination normal. GCS eye subscore is 4. GCS verbal subscore is 5. GCS motor subscore is 6.  Reflex Scores:      Bicep reflexes are 2+ on the right side and 2+ on the left side.      Patellar reflexes are 2+ on the right side and 2+ on the left side. Cranial nerves III-XII grossly intact Sensation to upper and lower extremities present - able to differentiation between sharp and dull sensation. Able to differentiate the touches to the left foot and toes (individually tested)  Skin: Skin is warm and dry. No rash noted. She is not diaphoretic. No erythema.  Psychiatric: She has a normal mood and affect. Her behavior is normal. Thought content normal.    ED Course  Procedures (including critical care time)  Dilaudid 2 mg IM and Ibuprofen 600 mg PO given for pain control.   Labs Reviewed - No data to display No results found.   1. Left hip pain   2. Left leg pain   3. Chronic neck pain   4. Chronic back pain   5. DM (diabetes mellitus)   6. HTN (hypertension)   7. C. difficile colitis       MDM  Patient afebrile, normotensive, non-tachycardic, alert and oriented. Patient presenting with left hip pain - has been seen in the ED setting numerous times for discomfort. [name] currently being treated with C. Diff and has follow-up. Patient currently being seen by orthopedics and referral awaiting from orthopedics for spine surgeon.  Full ROM to lower extremities, decreased flexion of left knee secondary to the left hip pain. Full ROM to the hip. Mild discomfort upon palpation to the left hip and down the left leg. No neurovascular damage noted.  Discussed findings and case with Dr. Silverio Lay - reported that imaging not warranted, mainly pain control needed. Pain managed in ED setting. Patient aseptic, non-toxic appearing, in no acute distress. Non septic joint. Negative eversion and  shortening of leg - r/o hip dislocation suspicion.Hip and leg pain is chronic - has been ongoing for many months and patient is following up with orthopedics and surgeon. Discharged patient. Discharged patient with small amount of pain medications - reported that she ran out of them - discussed route, precautions, and restrictions. Discussed with patient to follow-up with Dr. Ave Filter, orthopedic, and PCP. Discussed with patient to continue at home medications as prescribed. Discussed with patient to rest and stay hydrated. Discussed with patient to keep appointment with Dr. Ross Ludwig on 04/11/2013 regarding re-evaluation for C. Diff infection. Discussed with patient to continue monitoring blood pressure and glucose levels. Discussed with patient to monitor symptoms and if symptoms are to worsen or change to report back to the ED. Patient agreed to plan of care, understood, all questions answered.        Raymon Mutton, PA-C 03/31/13 1652

## 2013-03-31 NOTE — ED Notes (Signed)
Being treated for c-diff

## 2013-04-01 NOTE — ED Provider Notes (Signed)
Medical screening examination/treatment/procedure(s) were performed by non-physician practitioner and as supervising physician I was immediately available for consultation/collaboration.   Richardean Canal, MD 04/01/13 [phone]

## 2013-04-02 ENCOUNTER — Ambulatory Visit: Payer: Medicaid Other | Attending: Orthopedic Surgery | Admitting: Physical Therapy

## 2013-04-02 DIAGNOSIS — IMO0001 Reserved for inherently not codable concepts without codable children: Secondary | ICD-10-CM | POA: Insufficient documentation

## 2013-04-02 DIAGNOSIS — M545 Low back pain, unspecified: Secondary | ICD-10-CM | POA: Insufficient documentation

## 2013-04-09 ENCOUNTER — Ambulatory Visit: Payer: Medicaid Other | Admitting: Physical Therapy

## 2013-04-16 ENCOUNTER — Ambulatory Visit: Payer: Medicaid Other | Admitting: Physical Therapy

## 2013-04-18 ENCOUNTER — Encounter (HOSPITAL_COMMUNITY): Payer: Self-pay | Admitting: Emergency Medicine

## 2013-04-18 ENCOUNTER — Emergency Department (HOSPITAL_COMMUNITY)
Admission: EM | Admit: 2013-04-18 | Discharge: 2013-04-18 | Disposition: A | Discharge status: Home / Self Care | Payer: Medicaid Other | Attending: Emergency Medicine | Admitting: Emergency Medicine

## 2013-04-18 DIAGNOSIS — M129 Arthropathy, unspecified: Secondary | ICD-10-CM | POA: Insufficient documentation

## 2013-04-18 DIAGNOSIS — Z8739 Personal history of other diseases of the musculoskeletal system and connective tissue: Secondary | ICD-10-CM | POA: Insufficient documentation

## 2013-04-18 DIAGNOSIS — I10 Essential (primary) hypertension: Secondary | ICD-10-CM | POA: Insufficient documentation

## 2013-04-18 DIAGNOSIS — F17200 Nicotine dependence, unspecified, uncomplicated: Secondary | ICD-10-CM | POA: Insufficient documentation

## 2013-04-18 DIAGNOSIS — M25559 Pain in unspecified hip: Secondary | ICD-10-CM | POA: Insufficient documentation

## 2013-04-18 DIAGNOSIS — M79609 Pain in unspecified limb: Secondary | ICD-10-CM | POA: Insufficient documentation

## 2013-04-18 DIAGNOSIS — F319 Bipolar disorder, unspecified: Secondary | ICD-10-CM | POA: Insufficient documentation

## 2013-04-18 DIAGNOSIS — Z79899 Other long term (current) drug therapy: Secondary | ICD-10-CM | POA: Insufficient documentation

## 2013-04-18 DIAGNOSIS — Z7982 Long term (current) use of aspirin: Secondary | ICD-10-CM | POA: Insufficient documentation

## 2013-04-18 DIAGNOSIS — M25519 Pain in unspecified shoulder: Secondary | ICD-10-CM | POA: Insufficient documentation

## 2013-04-18 DIAGNOSIS — F411 Generalized anxiety disorder: Secondary | ICD-10-CM | POA: Insufficient documentation

## 2013-04-18 DIAGNOSIS — M542 Cervicalgia: Secondary | ICD-10-CM | POA: Insufficient documentation

## 2013-04-18 DIAGNOSIS — M5137 Other intervertebral disc degeneration, lumbosacral region: Secondary | ICD-10-CM | POA: Insufficient documentation

## 2013-04-18 DIAGNOSIS — E119 Type 2 diabetes mellitus without complications: Secondary | ICD-10-CM | POA: Insufficient documentation

## 2013-04-18 DIAGNOSIS — G8929 Other chronic pain: Secondary | ICD-10-CM | POA: Insufficient documentation

## 2013-04-18 DIAGNOSIS — Z8719 Personal history of other diseases of the digestive system: Secondary | ICD-10-CM | POA: Insufficient documentation

## 2013-04-18 DIAGNOSIS — M545 Low back pain, unspecified: Secondary | ICD-10-CM | POA: Insufficient documentation

## 2013-04-18 DIAGNOSIS — Z880 Allergy status to penicillin: Secondary | ICD-10-CM | POA: Insufficient documentation

## 2013-04-18 DIAGNOSIS — M549 Dorsalgia, unspecified: Secondary | ICD-10-CM

## 2013-04-18 DIAGNOSIS — R011 Cardiac murmur, unspecified: Secondary | ICD-10-CM | POA: Insufficient documentation

## 2013-04-18 DIAGNOSIS — M51379 Other intervertebral disc degeneration, lumbosacral region without mention of lumbar back pain or lower extremity pain: Secondary | ICD-10-CM | POA: Insufficient documentation

## 2013-04-18 MED ORDER — NAPROXEN 375 MG PO TABS: 375.0000 mg | ORAL_TABLET | Freq: Two times a day (BID) | ORAL | Status: DC

## 2013-04-18 MED ORDER — KETOROLAC TROMETHAMINE 60 MG/2ML IM SOLN
30.0000 mg | Freq: Once | INTRAMUSCULAR | Status: AC
  Administered 2013-04-18: 30 mg via INTRAMUSCULAR
  Filled 2013-04-18: qty 2

## 2013-04-18 NOTE — ED Provider Notes (Signed)
History    CSN: [id number] Arrival date & time 04/18/13  0758  First MD Initiated Contact with Patient 04/18/13 [phone]      Chief Complaint  Patient presents with  . Leg Pain  . Hip Pain  . Back Pain    HPI Patient presents to emergency room with complaints of recurrent hip back and leg pain. Patient states the symptoms have been ongoing for at least the last several months. She has been seen numerous times in the emergency department for this issue. She was referred to an orthopedist for further evaluation. Patient states she has been taking hydrocodone but it is not relieved. She has an appointment with the orthopedic Dr. but needs more pain medications. She denies any fevers, numbness or weakness. The pain is severe it increases when she ambulates.   Past Medical History  Diagnosis Date  . Hypertension   . Diabetes mellitus   . Bipolar 1 disorder   . Heart murmur   . Sciatica   . Arthritis   . Hepatitis     hep c ?, pt states thinks  . Anxiety   . Diverticulosis   . Chronic low back pain   . DDD (degenerative disc disease), lumbar   . Chronic shoulder pain   . Chronic neck pain     Past Surgical History  Procedure Laterality Date  . Facial tumor removal    . Brain tumor removal    . Brain surgery      in [place]  . Video bronchoscopy  12/04/2012    Procedure: VIDEO BRONCHOSCOPY;  Surgeon: Delight Ovens, MD;  Location: [facility] OR;  Service: Thoracic;  Laterality: N/A;  . Video assisted thoracoscopy (vats)/wedge resection  12/04/2012    Procedure: VIDEO ASSISTED THORACOSCOPY (VATS)/WEDGE RESECTION;  Surgeon: Delight Ovens, MD;  Location: [facility] OR;  Service: Thoracic;  Laterality: Right;  . Lobectomy  12/04/2012    Procedure: LOBECTOMY;  Surgeon: Delight Ovens, MD;  Location: [facility] OR;  Service: Thoracic;  Laterality: Right;  completion of right upper lobectomy and lymph node disection, placement of on q pump    Family History  Problem Relation Age of Onset  .  Hypertension Father     deceased  . Diabetes Father   . Heart disease Father   . Hyperlipidemia Father   . Hypertension Mother   . Hyperlipidemia Mother   . Diabetes Mother   . Hypertension Sister   . Hyperlipidemia Sister   . Hypertension Brother   . Hyperlipidemia Brother     History  Substance Use Topics  . Smoking status: Current Some Day Smoker -- 10 years    Types: Cigarettes  . Smokeless tobacco: Never Used     Comment: Smokes 1/2 pack per week  . Alcohol Use: No     Comment: quit 36yrs ago    OB History   Grav Para Term Preterm Abortions TAB SAB Ect Mult Living                  Review of Systems  All other systems reviewed and are negative.    Allergies  Haldol; Morphine and related; and Penicillins  Home Medications   Current Outpatient Rx  Name  Route  Sig  Dispense  Refill  . ALPRAZolam (XANAX) 0.5 MG tablet   Oral   Take 0.5 mg by mouth 3 (three) times daily.         Marland Kitchen aspirin EC 81 MG tablet   Oral  Take 81 mg by mouth daily.         Marland Kitchen dexlansoprazole (DEXILANT) 60 MG capsule   Oral   Take 60 mg by mouth every morning.          . dicyclomine (BENTYL) 20 MG tablet   Oral   Take 1 tablet (20 mg total) by mouth 2 (two) times daily.   20 tablet   0   . ergocalciferol (VITAMIN D2) 50000 UNITS capsule   Oral   Take 50,000 Units by mouth every Monday.         . gabapentin (NEURONTIN) 400 MG capsule   Oral   Take 400 mg by mouth 3 (three) times daily.          Marland Kitchen HYDROcodone-acetaminophen (NORCO/VICODIN) 5-325 MG per tablet   Oral   Take 1 tablet by mouth every 6 (six) hours as needed for pain.   30 tablet   0   . Ipratropium-Albuterol (COMBIVENT RESPIMAT) 20-100 MCG/ACT AERS respimat   Inhalation   Inhale 1 puff into the lungs every 6 (six) hours.         Marland Kitchen loratadine (CLARITIN) 10 MG tablet   Oral   Take 10 mg by mouth every morning.          . metFORMIN (GLUCOPHAGE) 500 MG tablet   Oral   Take 500 mg by mouth 2  (two) times daily with a meal.          . methocarbamol (ROBAXIN) 500 MG tablet   Oral   Take 1 tablet (500 mg total) by mouth 2 (two) times daily.   20 tablet   0   . metoprolol tartrate (LOPRESSOR) 25 MG tablet   Oral   Take 1 tablet (25 mg total) by mouth 2 (two) times daily.   60 tablet   0   . metroNIDAZOLE (FLAGYL) 500 MG tablet   Oral   Take 1 tablet (500 mg total) by mouth every 8 (eight) hours.   90 tablet   0   . mirtazapine (REMERON) 15 MG tablet   Oral   Take 15 mg by mouth at bedtime.         . naproxen (NAPROSYN) 375 MG tablet   Oral   Take 1 tablet (375 mg total) by mouth 2 (two) times daily.   20 tablet   0   . olmesartan (BENICAR) 40 MG tablet   Oral   Take 40 mg by mouth daily.         . ondansetron (ZOFRAN) 4 MG tablet   Oral   Take 1 tablet (4 mg total) by mouth every 6 (six) hours.   12 tablet   0   . oxyCODONE-acetaminophen (PERCOCET) 5-325 MG per tablet   Oral   Take 1 tablet by mouth every 6 (six) hours as needed for pain.   6 tablet   0   . simvastatin (ZOCOR) 20 MG tablet   Oral   Take 20 mg by mouth every evening.         . traZODone (DESYREL) 50 MG tablet   Oral   Take 1 tablet (50 mg total) by mouth at bedtime.   30 tablet   0     BP 149/81  Pulse 92  Temp(Src) 99 F (37.2 C) (Oral)  Resp 18  SpO2 96%  Physical Exam  Nursing note and vitals reviewed. Constitutional: She appears well-developed and well-nourished. No distress.  HENT:  Head: Normocephalic and atraumatic.  Right Ear: External ear normal.  Left Ear: External ear normal.  Eyes: Conjunctivae are normal. Right eye exhibits no discharge. Left eye exhibits no discharge. No scleral icterus.  Neck: Neck supple. No tracheal deviation present.  Cardiovascular: Normal rate and regular rhythm.   Pulmonary/Chest: Effort normal. No stridor. No respiratory distress. She has no wheezes.  Abdominal: She exhibits no distension. There is no tenderness.   Musculoskeletal: She exhibits no edema.  Neurological: She is alert. Cranial nerve deficit: no gross deficits.  Skin: Skin is warm and dry. No rash noted.  Psychiatric: She has a normal mood and affect.    ED Course  Procedures (including critical care time)  Labs Reviewed - No data to display No results found.   1. Chronic back pain       MDM  The patient has been evaluated numerous times in the past. She does have plain x-rays on file for left hip and lumbar spine. The patient is not demonstrating any evidence of acute neurovascular deficiency. I explained to the patient that she should get her pain medications refills from her primary Dr. or the orthopedic doctor she was referred to. [name] upset that I am not going to give her any narcotic pain medications but I do not feel that is appropriate and may cause more harm to her without having an individual Dr. controlling her narcotic pain medications.        Celene Kras, MD 04/18/13 [phone]

## 2013-04-18 NOTE — ED Notes (Signed)
Pt here for c/o hip, back, and leg pain not relieved with Vicodin pt pending Orthro Md Pt still taking Vanco for c-diff

## 2013-05-07 ENCOUNTER — Other Ambulatory Visit: Payer: Self-pay

## 2013-05-07 DIAGNOSIS — D381 Neoplasm of uncertain behavior of trachea, bronchus and lung: Secondary | ICD-10-CM

## 2013-05-21 ENCOUNTER — Encounter (HOSPITAL_COMMUNITY): Payer: Self-pay | Admitting: *Deleted

## 2013-05-21 ENCOUNTER — Emergency Department (HOSPITAL_COMMUNITY): Payer: Medicaid Other

## 2013-05-21 ENCOUNTER — Emergency Department (HOSPITAL_COMMUNITY)
Admission: EM | Admit: 2013-05-21 | Discharge: 2013-05-21 | Disposition: A | Discharge status: Home / Self Care | Payer: Medicaid Other | Attending: Emergency Medicine | Admitting: Emergency Medicine

## 2013-05-21 DIAGNOSIS — E119 Type 2 diabetes mellitus without complications: Secondary | ICD-10-CM | POA: Insufficient documentation

## 2013-05-21 DIAGNOSIS — M545 Low back pain, unspecified: Secondary | ICD-10-CM | POA: Insufficient documentation

## 2013-05-21 DIAGNOSIS — I10 Essential (primary) hypertension: Secondary | ICD-10-CM | POA: Insufficient documentation

## 2013-05-21 DIAGNOSIS — R197 Diarrhea, unspecified: Secondary | ICD-10-CM | POA: Insufficient documentation

## 2013-05-21 DIAGNOSIS — A0472 Enterocolitis due to Clostridium difficile, not specified as recurrent: Secondary | ICD-10-CM | POA: Insufficient documentation

## 2013-05-21 DIAGNOSIS — Z7982 Long term (current) use of aspirin: Secondary | ICD-10-CM | POA: Insufficient documentation

## 2013-05-21 DIAGNOSIS — Z8739 Personal history of other diseases of the musculoskeletal system and connective tissue: Secondary | ICD-10-CM | POA: Insufficient documentation

## 2013-05-21 DIAGNOSIS — F17200 Nicotine dependence, unspecified, uncomplicated: Secondary | ICD-10-CM | POA: Insufficient documentation

## 2013-05-21 DIAGNOSIS — Z8719 Personal history of other diseases of the digestive system: Secondary | ICD-10-CM | POA: Insufficient documentation

## 2013-05-21 DIAGNOSIS — R011 Cardiac murmur, unspecified: Secondary | ICD-10-CM | POA: Insufficient documentation

## 2013-05-21 DIAGNOSIS — F411 Generalized anxiety disorder: Secondary | ICD-10-CM | POA: Insufficient documentation

## 2013-05-21 DIAGNOSIS — Z79899 Other long term (current) drug therapy: Secondary | ICD-10-CM | POA: Insufficient documentation

## 2013-05-21 DIAGNOSIS — R109 Unspecified abdominal pain: Secondary | ICD-10-CM | POA: Insufficient documentation

## 2013-05-21 DIAGNOSIS — R52 Pain, unspecified: Secondary | ICD-10-CM | POA: Insufficient documentation

## 2013-05-21 DIAGNOSIS — F319 Bipolar disorder, unspecified: Secondary | ICD-10-CM | POA: Insufficient documentation

## 2013-05-21 LAB — URINALYSIS, ROUTINE W REFLEX MICROSCOPIC
Bilirubin Urine: NEGATIVE
Glucose, UA: NEGATIVE mg/dL
Hgb urine dipstick: NEGATIVE
Specific Gravity, Urine: 1.018 (ref 1.005–1.030)
Urobilinogen, UA: 0.2 mg/dL (ref 0.0–1.0)
pH: 7.5 (ref 5.0–8.0)

## 2013-05-21 LAB — CBC WITH DIFFERENTIAL/PLATELET
Basophils Absolute: 0 10*3/uL (ref 0.0–0.1)
Eosinophils Absolute: 0.2 10*3/uL (ref 0.0–0.7)
Eosinophils Relative: 2 % (ref 0–5)
Lymphs Abs: 3.2 10*3/uL (ref 0.7–4.0)
MCH: 27.3 pg (ref 26.0–34.0)
MCV: 85.5 fL (ref 78.0–100.0)
Platelets: 270 10*3/uL (ref 150–400)
RDW: 16 % — ABNORMAL HIGH (ref 11.5–15.5)

## 2013-05-21 LAB — URINE MICROSCOPIC-ADD ON

## 2013-05-21 LAB — COMPREHENSIVE METABOLIC PANEL
ALT: 14 U/L (ref 0–35)
Calcium: 9.3 mg/dL (ref 8.4–10.5)
GFR calc Af Amer: >90 mL/min (ref >90)
Glucose, Bld: 107 mg/dL — ABNORMAL HIGH (ref 70–99)
Sodium: 140 mEq/L (ref 135–145)
Total Protein: 6.6 g/dL (ref 6.0–8.3)

## 2013-05-21 MED ORDER — OXYCODONE-ACETAMINOPHEN 5-325 MG PO TABS
1.0000 | ORAL_TABLET | Freq: Once | ORAL | Status: AC
  Administered 2013-05-21: 1 via ORAL
  Filled 2013-05-21: qty 1

## 2013-05-21 MED ORDER — HYDROMORPHONE HCL PF 1 MG/ML IJ SOLN
1.0000 mg | Freq: Once | INTRAMUSCULAR | Status: AC
  Administered 2013-05-21: 1 mg via INTRAVENOUS
  Filled 2013-05-21: qty 1

## 2013-05-21 MED ORDER — ONDANSETRON HCL 4 MG/2ML IJ SOLN: 4.0000 mg | Freq: Once | INTRAMUSCULAR | Status: DC

## 2013-05-21 MED ORDER — VANCOMYCIN HCL 250 MG PO CAPS: 250.0000 mg | ORAL_CAPSULE | Freq: Four times a day (QID) | ORAL | Status: DC

## 2013-05-21 MED ORDER — HYDROMORPHONE HCL PF 1 MG/ML IJ SOLN
1.0000 mg | Freq: Once | INTRAMUSCULAR | Status: DC
  Filled 2013-05-21: qty 1

## 2013-05-21 MED ORDER — ONDANSETRON HCL 4 MG/2ML IJ SOLN
4.0000 mg | Freq: Once | INTRAMUSCULAR | Status: AC
  Administered 2013-05-21: 4 mg via INTRAVENOUS
  Filled 2013-05-21: qty 2

## 2013-05-21 MED ORDER — HYDROMORPHONE HCL PF 1 MG/ML IJ SOLN
1.0000 mg | Freq: Once | INTRAMUSCULAR | Status: AC
  Administered 2013-05-21: 1 mg via INTRAMUSCULAR

## 2013-05-21 MED ORDER — OXYCODONE-ACETAMINOPHEN 5-325 MG PO TABS: 1.0000 | ORAL_TABLET | Freq: Four times a day (QID) | ORAL | Status: DC | PRN

## 2013-05-21 MED ORDER — ONDANSETRON 4 MG PO TBDP
4.0000 mg | ORAL_TABLET | Freq: Once | ORAL | Status: AC
  Administered 2013-05-21: 4 mg via ORAL
  Filled 2013-05-21: qty 1

## 2013-05-21 NOTE — ED Notes (Signed)
Pt reports bending over to get something this am and now having severe back pain. Pain is also in her abd and having watery diarrhea this am.

## 2013-05-21 NOTE — ED Provider Notes (Signed)
History    CSN: [id number] Arrival date & time 05/21/13  0945  First MD Initiated Contact with Patient 05/21/13 1043     Chief Complaint  Patient presents with  . Back Pain  . Abdominal Pain  . Diarrhea   (Consider location/radiation/quality/duration/timing/severity/associated sxs/prior Treatment) [name] a 62 y.o. female presenting with back pain, abdominal pain, and diarrhea. The history is provided by the patient.  Back Pain Location:  Lumbar spine Quality:  Burning and shooting (states started when she tried to get off the toilet after having diarrhea.) Radiates to: no radiation down the leg but chronic lumbar radiculopathy radiating down the left leg that is unchanged. Pain severity:  Severe Pain is:  Same all the time Onset quality:  Sudden Duration:  4 hours Timing:  Constant Progression:  Unchanged Chronicity:  New Context comment:  Started when standing up off the toilet after having diarrhea.  also describes a burning sensation in the rectum Relieved by:  Nothing Worsened by:  Bending and movement Associated symptoms: abdominal pain   Abdominal pain:    Location:  LLQ, RUQ and suprapubic   Quality:  Aching, fullness, pressure and throbbing   Severity:  Severe   Onset quality:  Sudden   Timing:  Constant   Progression:  Unchanged   Chronicity:  New Risk factors: menopause   Risk factors: no recent surgery   Abdominal Pain Associated symptoms include abdominal pain.  Diarrhea Quality:  Watery Severity:  Moderate Onset quality:  Sudden Number of episodes:  4 Duration:  5 hours Progression:  Worsening Relieved by:  Nothing Worsened by:  Nothing tried Associated symptoms: abdominal pain   Risk factors comment:  Prior hx of c.diff finished vanc about 3 weeks ago  Past Medical History  Diagnosis Date  . Hypertension   . Diabetes mellitus   . Bipolar 1 disorder   . Heart murmur   . Sciatica   . Arthritis   . Hepatitis     hep c ?, pt states thinks    . Anxiety   . Diverticulosis   . Chronic low back pain   . DDD (degenerative disc disease), lumbar   . Chronic shoulder pain   . Chronic neck pain    Past Surgical History  Procedure Laterality Date  . Facial tumor removal    . Brain tumor removal    . Brain surgery      in [place]  . Video bronchoscopy  12/04/2012    Procedure: VIDEO BRONCHOSCOPY;  Surgeon: Delight Ovens, MD;  Location: [facility] OR;  Service: Thoracic;  Laterality: N/A;  . Video assisted thoracoscopy (vats)/wedge resection  12/04/2012    Procedure: VIDEO ASSISTED THORACOSCOPY (VATS)/WEDGE RESECTION;  Surgeon: Delight Ovens, MD;  Location: [facility] OR;  Service: Thoracic;  Laterality: Right;  . Lobectomy  12/04/2012    Procedure: LOBECTOMY;  Surgeon: Delight Ovens, MD;  Location: [facility] OR;  Service: Thoracic;  Laterality: Right;  completion of right upper lobectomy and lymph node disection, placement of on q pump   Family History  Problem Relation Age of Onset  . Hypertension Father     deceased  . Diabetes Father   . Heart disease Father   . Hyperlipidemia Father   . Hypertension Mother   . Hyperlipidemia Mother   . Diabetes Mother   . Hypertension Sister   . Hyperlipidemia Sister   . Hypertension Brother   . Hyperlipidemia Brother    History  Substance Use Topics  .  Smoking status: Current Some Day Smoker -- 10 years    Types: Cigarettes  . Smokeless tobacco: Never Used     Comment: Smokes 1/2 pack per week  . Alcohol Use: No     Comment: quit 5yrs ago   OB History   Grav Para Term Preterm Abortions TAB SAB Ect Mult Living                 Review of Systems  Gastrointestinal: Positive for abdominal pain and diarrhea.  Musculoskeletal: Positive for back pain.       Cortisone injection on June 24 without improvement.  Always has pain down the left leg  All other systems reviewed and are negative.    Allergies  Haldol; Metronidazole; Morphine and related; and Penicillins  Home Medications    Current Outpatient Rx  Name  Route  Sig  Dispense  Refill  . ALPRAZolam (XANAX) 0.5 MG tablet   Oral   Take 0.5 mg by mouth 2 (two) times daily.          Marland Kitchen aspirin EC 81 MG tablet   Oral   Take 81 mg by mouth daily.         Marland Kitchen dicyclomine (BENTYL) 20 MG tablet   Oral   Take 1 tablet (20 mg total) by mouth 2 (two) times daily.   20 tablet   0   . ergocalciferol (VITAMIN D2) 50000 UNITS capsule   Oral   Take 50,000 Units by mouth every Monday.         . esomeprazole (NEXIUM) 20 MG capsule   Oral   Take 20 mg by mouth daily before breakfast.         . gabapentin (NEURONTIN) 400 MG capsule   Oral   Take 400 mg by mouth 3 (three) times daily.          Marland Kitchen HYDROcodone-acetaminophen (NORCO/VICODIN) 5-325 MG per tablet   Oral   Take 1 tablet by mouth every 6 (six) hours as needed for pain.   30 tablet   0   . Ipratropium-Albuterol (COMBIVENT RESPIMAT) 20-100 MCG/ACT AERS respimat   Inhalation   Inhale 1 puff into the lungs every 6 (six) hours.         Marland Kitchen loratadine (CLARITIN) 10 MG tablet   Oral   Take 10 mg by mouth every morning.          . metFORMIN (GLUCOPHAGE) 500 MG tablet   Oral   Take 500 mg by mouth 2 (two) times daily with a meal.          . methocarbamol (ROBAXIN) 500 MG tablet   Oral   Take 1 tablet (500 mg total) by mouth 2 (two) times daily.   20 tablet   0   . metoprolol tartrate (LOPRESSOR) 25 MG tablet   Oral   Take 1 tablet (25 mg total) by mouth 2 (two) times daily.   60 tablet   0   . olmesartan (BENICAR) 40 MG tablet   Oral   Take 40 mg by mouth daily.         . ondansetron (ZOFRAN) 4 MG tablet   Oral   Take 1 tablet (4 mg total) by mouth every 6 (six) hours.   12 tablet   0   . simvastatin (ZOCOR) 20 MG tablet   Oral   Take 20 mg by mouth every evening.         . traZODone (DESYREL) 50 MG tablet  Oral   Take 1 tablet (50 mg total) by mouth at bedtime.   30 tablet   0    BP 128/73  Pulse 88  Temp(Src)  99.2 F (37.3 C) (Oral)  Resp 18  SpO2 100% Physical Exam  Nursing note and vitals reviewed. Constitutional: She is oriented to person, place, and time. She appears well-developed and well-nourished. She appears distressed.  HENT:  Head: Normocephalic and atraumatic.  Mouth/Throat: Oropharynx is clear and moist.  Eyes: Conjunctivae and EOM are normal. Pupils are equal, round, and reactive to light.  Neck: Normal range of motion. Neck supple.  Cardiovascular: Normal rate, regular rhythm and intact distal pulses.   No murmur heard. Pulmonary/Chest: Effort normal and breath sounds normal. No respiratory distress. She has no wheezes. She has no rales.  Abdominal: Soft. Normal appearance. She exhibits no distension. There is tenderness in the right lower quadrant, suprapubic area and left lower quadrant. There is no rebound, no guarding and no CVA tenderness.  Genitourinary: Rectum normal. Rectal exam shows no external hemorrhoid, no fissure, no mass and no tenderness.  Musculoskeletal: Normal range of motion. She exhibits tenderness. She exhibits no edema.       Lumbar back: She exhibits tenderness and bony tenderness. She exhibits normal pulse.       Back:  Neurological: She is alert and oriented to person, place, and time. She has normal strength. No sensory deficit.  Skin: Skin is warm and dry. No rash noted. No erythema.  Psychiatric: She has a normal mood and affect. Her behavior is normal.    ED Course  Procedures (including critical care time) Labs Reviewed  CBC WITH DIFFERENTIAL - Abnormal; Notable for the following:    RDW 16.0 (*)    All other components within normal limits  COMPREHENSIVE METABOLIC PANEL - Abnormal; Notable for the following:    Potassium 3.2 (*)    Glucose, Bld 107 (*)    All other components within normal limits  URINALYSIS, ROUTINE W REFLEX MICROSCOPIC - Abnormal; Notable for the following:    APPearance TURBID (*)    Leukocytes, UA SMALL (*)    All  other components within normal limits  URINE MICROSCOPIC-ADD ON - Abnormal; Notable for the following:    Squamous Epithelial / LPF FEW (*)    All other components within normal limits  CLOSTRIDIUM DIFFICILE BY PCR   Dg Abd Acute W/chest  05/21/2013   *RADIOLOGY REPORT*  Clinical Data: Neck pain and diarrhea.  ACUTE ABDOMEN SERIES (ABDOMEN 2 VIEW & CHEST 1 VIEW)  Comparison: 03/13/2013.  Findings: Stable postsurgical changes on the right with elevation of the right hemidiaphragm.  The heart remains normal in size. Clear lungs.  Minimal central peribronchial thickening.  Normal bowel gas pattern without free peritoneal air.  Lumbar spine degenerative changes.  IMPRESSION:  1.  No acute abnormality. 2.  Minimal chronic bronchitic changes.   Original Report Authenticated By: Beckie Salts, M.D.   No diagnosis found.  MDM   Patient with a history of chronic back pain status post cortisone injection on June 24 with persistent lumbar radiculopathy down the left leg however today she had 4 episodes of diarrhea which she stated was similar to when she had C. difficile but then she felt like something popped inside her back and caused pain on the right side and lower abdomen. She denies any dysuria or vaginal complaints but appears uncomfortable on exam. She has mild tenderness in the back with palpation but also has  lower abdominal tenderness in the right, left lower quadrant in suprapubic area. She is neurovascularly intact and has normal pulses bilaterally. Normal rectal exam and normal tone. Feel that patient's pain may be exacerbation of her chronic back pain versus abdominal pathology with radiation to the back. Also diarrhea concerning for C. difficile she has a history of recurrent C. difficile and finished oral vancomycin approximately 3 weeks ago. She denies any recent antibiotic use other than vanc.  Concern for C. difficile today given recurrent diarrhea versus other abdominal pathology as cause of  her pain versus exacerbation of her chronic back pain. [name] neurovascularly intact with low concern for cauda equina at this time. However given the abdominal pain we'll do an acute abdominal series to look for free air CBC, CMP, UA pending.  Normal pulses throughout and low suspicion for AAA.  1:37 PM  Pt's pain improved and labs/x-ray wnl.  Feel pt's pain is exacerbated pain from DDD.  Also feel she has recurrent c-diff and will start her back on vanc.   Gwyneth Sprout, MD 05/21/13 1339

## 2013-05-26 ENCOUNTER — Inpatient Hospital Stay (HOSPITAL_COMMUNITY)
Admission: EM | Admit: 2013-05-26 | Discharge: 2013-05-31 | DRG: 392 | Disposition: A | Discharge status: Home / Self Care | Payer: Medicaid Other | Attending: Internal Medicine | Admitting: Internal Medicine

## 2013-05-26 ENCOUNTER — Encounter (HOSPITAL_COMMUNITY): Payer: Self-pay | Admitting: *Deleted

## 2013-05-26 DIAGNOSIS — Z833 Family history of diabetes mellitus: Secondary | ICD-10-CM

## 2013-05-26 DIAGNOSIS — R197 Diarrhea, unspecified: Secondary | ICD-10-CM

## 2013-05-26 DIAGNOSIS — F411 Generalized anxiety disorder: Secondary | ICD-10-CM | POA: Diagnosis present

## 2013-05-26 DIAGNOSIS — K625 Hemorrhage of anus and rectum: Secondary | ICD-10-CM | POA: Diagnosis present

## 2013-05-26 DIAGNOSIS — I4729 Other ventricular tachycardia: Secondary | ICD-10-CM | POA: Diagnosis present

## 2013-05-26 DIAGNOSIS — K5730 Diverticulosis of large intestine without perforation or abscess without bleeding: Secondary | ICD-10-CM | POA: Diagnosis present

## 2013-05-26 DIAGNOSIS — B1910 Unspecified viral hepatitis B without hepatic coma: Secondary | ICD-10-CM | POA: Diagnosis present

## 2013-05-26 DIAGNOSIS — Z888 Allergy status to other drugs, medicaments and biological substances status: Secondary | ICD-10-CM

## 2013-05-26 DIAGNOSIS — I10 Essential (primary) hypertension: Secondary | ICD-10-CM

## 2013-05-26 DIAGNOSIS — F319 Bipolar disorder, unspecified: Secondary | ICD-10-CM | POA: Diagnosis present

## 2013-05-26 DIAGNOSIS — F419 Anxiety disorder, unspecified: Secondary | ICD-10-CM

## 2013-05-26 DIAGNOSIS — Z860100 Personal history of colon polyps, unspecified: Secondary | ICD-10-CM

## 2013-05-26 DIAGNOSIS — I472 Ventricular tachycardia, unspecified: Secondary | ICD-10-CM | POA: Diagnosis present

## 2013-05-26 DIAGNOSIS — Z880 Allergy status to penicillin: Secondary | ICD-10-CM

## 2013-05-26 DIAGNOSIS — A0811 Acute gastroenteropathy due to Norwalk agent: Principal | ICD-10-CM | POA: Diagnosis present

## 2013-05-26 DIAGNOSIS — Z8249 Family history of ischemic heart disease and other diseases of the circulatory system: Secondary | ICD-10-CM

## 2013-05-26 DIAGNOSIS — M5137 Other intervertebral disc degeneration, lumbosacral region: Secondary | ICD-10-CM | POA: Diagnosis present

## 2013-05-26 DIAGNOSIS — K5289 Other specified noninfective gastroenteritis and colitis: Secondary | ICD-10-CM

## 2013-05-26 DIAGNOSIS — B1920 Unspecified viral hepatitis C without hepatic coma: Secondary | ICD-10-CM | POA: Diagnosis present

## 2013-05-26 DIAGNOSIS — E119 Type 2 diabetes mellitus without complications: Secondary | ICD-10-CM | POA: Diagnosis present

## 2013-05-26 DIAGNOSIS — M51379 Other intervertebral disc degeneration, lumbosacral region without mention of lumbar back pain or lower extremity pain: Secondary | ICD-10-CM | POA: Diagnosis present

## 2013-05-26 DIAGNOSIS — G8929 Other chronic pain: Secondary | ICD-10-CM | POA: Diagnosis present

## 2013-05-26 DIAGNOSIS — F1210 Cannabis abuse, uncomplicated: Secondary | ICD-10-CM | POA: Diagnosis present

## 2013-05-26 DIAGNOSIS — Z8619 Personal history of other infectious and parasitic diseases: Secondary | ICD-10-CM

## 2013-05-26 DIAGNOSIS — F1910 Other psychoactive substance abuse, uncomplicated: Secondary | ICD-10-CM | POA: Diagnosis present

## 2013-05-26 DIAGNOSIS — M5430 Sciatica, unspecified side: Secondary | ICD-10-CM | POA: Diagnosis present

## 2013-05-26 DIAGNOSIS — C3410 Malignant neoplasm of upper lobe, unspecified bronchus or lung: Secondary | ICD-10-CM | POA: Diagnosis present

## 2013-05-26 DIAGNOSIS — Z79899 Other long term (current) drug therapy: Secondary | ICD-10-CM

## 2013-05-26 DIAGNOSIS — R112 Nausea with vomiting, unspecified: Secondary | ICD-10-CM

## 2013-05-26 DIAGNOSIS — F17200 Nicotine dependence, unspecified, uncomplicated: Secondary | ICD-10-CM | POA: Diagnosis present

## 2013-05-26 DIAGNOSIS — R109 Unspecified abdominal pain: Secondary | ICD-10-CM | POA: Diagnosis present

## 2013-05-26 DIAGNOSIS — Z7982 Long term (current) use of aspirin: Secondary | ICD-10-CM

## 2013-05-26 HISTORY — DX: Enterocolitis due to Clostridium difficile, not specified as recurrent: A04.72

## 2013-05-26 HISTORY — DX: Unspecified viral hepatitis B without hepatic coma: B19.10

## 2013-05-26 HISTORY — DX: Unspecified viral hepatitis C without hepatic coma: B19.20

## 2013-05-26 HISTORY — DX: Malignant neoplasm of unspecified part of unspecified bronchus or lung: C34.90

## 2013-05-26 LAB — CBC WITH DIFFERENTIAL/PLATELET
Eosinophils Relative: 2 % (ref 0–5)
Hemoglobin: 13.6 g/dL (ref 12.0–15.0)
Lymphocytes Relative: 30 % (ref 12–46)
Lymphs Abs: 2.3 10*3/uL (ref 0.7–4.0)
MCV: 85.2 fL (ref 78.0–100.0)
Monocytes Relative: 6 % (ref 3–12)
Platelets: 274 10*3/uL (ref 150–400)
RBC: 4.85 MIL/uL (ref 3.87–5.11)
WBC: 7.7 10*3/uL (ref 4.0–10.5)

## 2013-05-26 LAB — COMPREHENSIVE METABOLIC PANEL
ALT: 15 U/L (ref 0–35)
Alkaline Phosphatase: 65 U/L (ref 39–117)
CO2: 27 mEq/L (ref 19–32)
GFR calc Af Amer: >90 mL/min (ref >90)
Glucose, Bld: 125 mg/dL — ABNORMAL HIGH (ref 70–99)
Potassium: 3.6 mEq/L (ref 3.5–5.1)
Sodium: 139 mEq/L (ref 135–145)
Total Protein: 6.5 g/dL (ref 6.0–8.3)

## 2013-05-26 LAB — RAPID URINE DRUG SCREEN, HOSP PERFORMED
Cocaine: NOT DETECTED
Opiates: POSITIVE — AB
Tetrahydrocannabinol: POSITIVE — AB

## 2013-05-26 LAB — URINALYSIS, ROUTINE W REFLEX MICROSCOPIC
Ketones, ur: NEGATIVE mg/dL
Protein, ur: NEGATIVE mg/dL
Urobilinogen, UA: 0.2 mg/dL (ref 0.0–1.0)
pH: 7.5 (ref 5.0–8.0)

## 2013-05-26 LAB — URINE MICROSCOPIC-ADD ON

## 2013-05-26 LAB — CLOSTRIDIUM DIFFICILE BY PCR: Toxigenic C. Difficile by PCR: NEGATIVE

## 2013-05-26 MED ORDER — METRONIDAZOLE 500 MG PO TABS: 500.0000 mg | ORAL_TABLET | Freq: Three times a day (TID) | ORAL | Status: DC

## 2013-05-26 MED ORDER — ONDANSETRON 4 MG PO TBDP: 4.0000 mg | ORAL_TABLET | Freq: Three times a day (TID) | ORAL | Status: DC | PRN

## 2013-05-26 MED ORDER — ENOXAPARIN SODIUM 40 MG/0.4ML ~~LOC~~ SOLN
40.0000 mg | SUBCUTANEOUS | Status: DC
  Filled 2013-05-26: qty 0.4

## 2013-05-26 MED ORDER — METOPROLOL TARTRATE 25 MG PO TABS
25.0000 mg | ORAL_TABLET | Freq: Two times a day (BID) | ORAL | Status: DC
  Administered 2013-05-27 – 2013-05-31 (×9): 25 mg via ORAL
  Filled 2013-05-26 (×11): qty 1

## 2013-05-26 MED ORDER — SODIUM CHLORIDE 0.9 % IJ SOLN
3.0000 mL | Freq: Two times a day (BID) | INTRAMUSCULAR | Status: DC
  Administered 2013-05-27 – 2013-05-31 (×4): 3 mL via INTRAVENOUS

## 2013-05-26 MED ORDER — LORAZEPAM 2 MG/ML IJ SOLN
1.0000 mg | Freq: Once | INTRAMUSCULAR | Status: AC
  Administered 2013-05-26: 1 mg via INTRAVENOUS
  Filled 2013-05-26: qty 1

## 2013-05-26 MED ORDER — HYDROMORPHONE HCL PF 1 MG/ML IJ SOLN
0.5000 mg | Freq: Once | INTRAMUSCULAR | Status: AC
  Administered 2013-05-26: 0.5 mg via INTRAVENOUS
  Filled 2013-05-26: qty 1

## 2013-05-26 MED ORDER — ONDANSETRON 4 MG PO TBDP
8.0000 mg | ORAL_TABLET | Freq: Once | ORAL | Status: DC
  Filled 2013-05-26: qty 2

## 2013-05-26 MED ORDER — IOHEXOL 300 MG/ML  SOLN: 100.0000 mL | Freq: Once | INTRAMUSCULAR | Status: AC | PRN

## 2013-05-26 MED ORDER — PROMETHAZINE HCL 25 MG/ML IJ SOLN
25.0000 mg | Freq: Once | INTRAMUSCULAR | Status: AC
  Administered 2013-05-26: 25 mg via INTRAMUSCULAR
  Filled 2013-05-26: qty 1

## 2013-05-26 MED ORDER — VANCOMYCIN HCL IN DEXTROSE 1-5 GM/200ML-% IV SOLN: 1000.0000 mg | Freq: Once | INTRAVENOUS | Status: DC

## 2013-05-26 MED ORDER — ACETAMINOPHEN 325 MG PO TABS
650.0000 mg | ORAL_TABLET | Freq: Once | ORAL | Status: DC
  Filled 2013-05-26: qty 2

## 2013-05-26 MED ORDER — IOHEXOL 300 MG/ML  SOLN: 25.0000 mL | INTRAMUSCULAR | Status: AC

## 2013-05-26 MED ORDER — METRONIDAZOLE 500 MG PO TABS
500.0000 mg | ORAL_TABLET | Freq: Once | ORAL | Status: DC
  Filled 2013-05-26: qty 1

## 2013-05-26 MED ORDER — ASPIRIN EC 81 MG PO TBEC
81.0000 mg | DELAYED_RELEASE_TABLET | Freq: Every day | ORAL | Status: DC
  Administered 2013-05-27 – 2013-05-31 (×5): 81 mg via ORAL
  Filled 2013-05-26 (×5): qty 1

## 2013-05-26 MED ORDER — PROMETHAZINE HCL 25 MG/ML IJ SOLN
12.5000 mg | Freq: Four times a day (QID) | INTRAMUSCULAR | Status: DC | PRN
  Filled 2013-05-26: qty 1

## 2013-05-26 MED ORDER — HYDROMORPHONE HCL PF 1 MG/ML IJ SOLN
1.0000 mg | Freq: Once | INTRAMUSCULAR | Status: DC
  Filled 2013-05-26: qty 1

## 2013-05-26 MED ORDER — IPRATROPIUM-ALBUTEROL 20-100 MCG/ACT IN AERS
1.0000 | INHALATION_SPRAY | Freq: Four times a day (QID) | RESPIRATORY_TRACT | Status: DC
  Filled 2013-05-26: qty 4

## 2013-05-26 MED ORDER — INSULIN ASPART 100 UNIT/ML ~~LOC~~ SOLN
0.0000 [IU] | Freq: Four times a day (QID) | SUBCUTANEOUS | Status: DC
  Administered 2013-05-27: 2 [IU] via SUBCUTANEOUS
  Administered 2013-05-27: 1 [IU] via SUBCUTANEOUS
  Administered 2013-05-27: 2 [IU] via SUBCUTANEOUS
  Administered 2013-05-29 – 2013-05-31 (×3): 1 [IU] via SUBCUTANEOUS

## 2013-05-26 MED ORDER — HYDROMORPHONE HCL PF 1 MG/ML IJ SOLN: 0.5000 mg | INTRAMUSCULAR | Status: DC | PRN

## 2013-05-26 MED ORDER — FENTANYL CITRATE 0.05 MG/ML IJ SOLN
50.0000 ug | Freq: Once | INTRAMUSCULAR | Status: DC
  Filled 2013-05-26: qty 2

## 2013-05-26 MED ORDER — MORPHINE SULFATE 4 MG/ML IJ SOLN: 4.0000 mg | Freq: Once | INTRAMUSCULAR | Status: DC

## 2013-05-26 MED ORDER — ACETAMINOPHEN 500 MG PO TABS: 500.0000 mg | ORAL_TABLET | Freq: Four times a day (QID) | ORAL | Status: DC | PRN

## 2013-05-26 MED ORDER — METRONIDAZOLE IN NACL 5-0.79 MG/ML-% IV SOLN
500.0000 mg | Freq: Three times a day (TID) | INTRAVENOUS | Status: DC
  Administered 2013-05-27 – 2013-05-29 (×8): 500 mg via INTRAVENOUS
  Filled 2013-05-26 (×10): qty 100

## 2013-05-26 MED ORDER — CIPROFLOXACIN IN D5W 400 MG/200ML IV SOLN
400.0000 mg | Freq: Two times a day (BID) | INTRAVENOUS | Status: DC
  Administered 2013-05-27: 400 mg via INTRAVENOUS
  Filled 2013-05-26 (×2): qty 200

## 2013-05-26 MED ORDER — ONDANSETRON 4 MG PO TBDP
8.0000 mg | ORAL_TABLET | Freq: Once | ORAL | Status: AC
  Administered 2013-05-26: 8 mg via ORAL

## 2013-05-26 MED ORDER — SODIUM CHLORIDE 0.9 % IV SOLN
INTRAVENOUS | Status: DC
  Administered 2013-05-26: 21:00:00 via INTRAVENOUS

## 2013-05-26 MED ORDER — IPRATROPIUM-ALBUTEROL 20-100 MCG/ACT IN AERS
1.0000 | INHALATION_SPRAY | Freq: Four times a day (QID) | RESPIRATORY_TRACT | Status: DC
  Administered 2013-05-26: 1 via RESPIRATORY_TRACT
  Filled 2013-05-26: qty 4

## 2013-05-26 MED ORDER — SODIUM CHLORIDE 0.9 % IV BOLUS (SEPSIS): 1000.0000 mL | Freq: Once | INTRAVENOUS | Status: DC

## 2013-05-26 NOTE — ED Provider Notes (Signed)
Patient complains of severe diffuse abdominal pain that 4 days ago. With multiple episodes diarrhea. She reports 13 episodes of diarrhea today. On exam patient is extremely uncomfortable. Complaint crampy abdominal pain. Abdomen is mildly obese, normal active bowel sounds. No specific tenderness. [name] writhing on the bed, uncomfortable. Morphine ordered. Internal medicine service called ovale patient for admission for pain control, hydration. Results for orders placed during the hospital encounter of 05/26/13  CLOSTRIDIUM DIFFICILE BY PCR      Result Value Range   C difficile by pcr NEGATIVE  NEGATIVE  URINALYSIS, ROUTINE W REFLEX MICROSCOPIC      Result Value Range   Color, Urine AMBER (*) YELLOW   APPearance CLEAR  CLEAR   Specific Gravity, Urine 1.031 (*) 1.005 - 1.030   pH 7.5  5.0 - 8.0   Glucose, UA NEGATIVE  NEGATIVE mg/dL   Hgb urine dipstick NEGATIVE  NEGATIVE   Bilirubin Urine SMALL (*) NEGATIVE   Ketones, ur NEGATIVE  NEGATIVE mg/dL   Protein, ur NEGATIVE  NEGATIVE mg/dL   Urobilinogen, UA 0.2  0.0 - 1.0 mg/dL   Nitrite NEGATIVE  NEGATIVE   Leukocytes, UA TRACE (*) NEGATIVE  COMPREHENSIVE METABOLIC PANEL      Result Value Range   Sodium 139  135 - 145 mEq/L   Potassium 3.6  3.5 - 5.1 mEq/L   Chloride 102  96 - 112 mEq/L   CO2 27  19 - 32 mEq/L   Glucose, Bld 125 (*) 70 - 99 mg/dL   BUN 12  6 - 23 mg/dL   Creatinine, Ser 9.14  0.50 - 1.10 mg/dL   Calcium 9.1  8.4 - 78.2 mg/dL   Total Protein 6.5  6.0 - 8.3 g/dL   Albumin 3.5  3.5 - 5.2 g/dL   AST 18  0 - 37 U/L   ALT 15  0 - 35 U/L   Alkaline Phosphatase 65  39 - 117 U/L   Total Bilirubin 0.5  0.3 - 1.2 mg/dL   GFR calc non Af Amer >90  >90 mL/min   GFR calc Af Amer >90  >90 mL/min  URINE MICROSCOPIC-ADD ON      Result Value Range   Squamous Epithelial / LPF FEW (*) RARE   WBC, UA 0-2  <3 WBC/hpf   Bacteria, UA FEW (*) RARE   Casts HYALINE CASTS (*) NEGATIVE   Urine-Other MUCOUS PRESENT     Dg Abd Acute  W/chest  05/21/2013   *RADIOLOGY REPORT*  Clinical Data: Neck pain and diarrhea.  ACUTE ABDOMEN SERIES (ABDOMEN 2 VIEW & CHEST 1 VIEW)  Comparison: 03/13/2013.  Findings: Stable postsurgical changes on the right with elevation of the right hemidiaphragm.  The heart remains normal in size. Clear lungs.  Minimal central peribronchial thickening.  Normal bowel gas pattern without free peritoneal air.  Lumbar spine degenerative changes.  IMPRESSION:  1.  No acute abnormality. 2.  Minimal chronic bronchitic changes.   Original Report Authenticated By: Beckie Salts, M.D.     Doug Sou, MD 05/26/13 1726

## 2013-05-26 NOTE — ED Notes (Signed)
Attempt IV access, unsuccessful. Paged IV team. Pt refused PO meds

## 2013-05-26 NOTE — ED Notes (Signed)
IV team and phlebotomy at the bedside. 

## 2013-05-26 NOTE — ED Notes (Signed)
Pt writhing around on the bed after getting back in bed from the bedside commode. Stating she is hurting all over. Dr. Ethelda Chick made aware.

## 2013-05-26 NOTE — ED Provider Notes (Signed)
History    CSN: [id number] Arrival date & time 05/26/13  1224  First MD Initiated Contact with Patient 05/26/13 1337     Chief Complaint  Patient presents with  . Diarrhea   (Consider location/radiation/quality/duration/timing/severity/associated sxs/prior Treatment) HPI Pt is a 62yo female with hx of recurrent C. Diff c/o continuous episodes of diarrhea with watery stools without blood or mucous worsening over the past 2-3 days.  Pt also c/o constant cramping abdominal pain that started 2-3 days ago as well.  Nothing seems to make symptoms better or worse.  Pt denies fever, nausea or vomiting.  Denies any recent use of antibiotics besides vancomycin for recent C. Diff.  States she was seen last week for chronic back pain and also given a prescription for vanc. However states the pharmacy told her they will not have it until tomorrow 7/8.  Past Medical History  Diagnosis Date  . Hypertension   . Diabetes mellitus   . Bipolar 1 disorder   . Heart murmur   . Sciatica   . Arthritis   . Hepatitis     hep c ?, pt states thinks  . Anxiety   . Diverticulosis   . Chronic low back pain   . DDD (degenerative disc disease), lumbar   . Chronic shoulder pain   . Chronic neck pain    Past Surgical History  Procedure Laterality Date  . Facial tumor removal    . Brain tumor removal    . Brain surgery      in [place]  . Video bronchoscopy  12/04/2012    Procedure: VIDEO BRONCHOSCOPY;  Surgeon: Delight Ovens, MD;  Location: [facility] OR;  Service: Thoracic;  Laterality: N/A;  . Video assisted thoracoscopy (vats)/wedge resection  12/04/2012    Procedure: VIDEO ASSISTED THORACOSCOPY (VATS)/WEDGE RESECTION;  Surgeon: Delight Ovens, MD;  Location: [facility] OR;  Service: Thoracic;  Laterality: Right;  . Lobectomy  12/04/2012    Procedure: LOBECTOMY;  Surgeon: Delight Ovens, MD;  Location: [facility] OR;  Service: Thoracic;  Laterality: Right;  completion of right upper lobectomy and lymph node  disection, placement of on q pump   Family History  Problem Relation Age of Onset  . Hypertension Father     deceased  . Diabetes Father   . Heart disease Father   . Hyperlipidemia Father   . Hypertension Mother   . Hyperlipidemia Mother   . Diabetes Mother   . Hypertension Sister   . Hyperlipidemia Sister   . Hypertension Brother   . Hyperlipidemia Brother    History  Substance Use Topics  . Smoking status: Current Some Day Smoker -- 10 years    Types: Cigarettes  . Smokeless tobacco: Never Used     Comment: Smokes 1/2 pack per week  . Alcohol Use: No     Comment: quit 69yrs ago   OB History   Grav Para Term Preterm Abortions TAB SAB Ect Mult Living                 Review of Systems  Constitutional: Positive for fatigue. Negative for fever, chills and diaphoresis.  Gastrointestinal: Positive for nausea, abdominal pain and diarrhea. Negative for vomiting, constipation, blood in stool and abdominal distention.  All other systems reviewed and are negative.    Allergies  Haldol; Metronidazole; Morphine and related; and Penicillins  Home Medications   Current Outpatient Rx  Name  Route  Sig  Dispense  Refill  . ALPRAZolam (XANAX) 0.5  MG tablet   Oral   Take 0.5 mg by mouth 2 (two) times daily.          Marland Kitchen aspirin EC 81 MG tablet   Oral   Take 81 mg by mouth daily.         Marland Kitchen dicyclomine (BENTYL) 20 MG tablet   Oral   Take 1 tablet (20 mg total) by mouth 2 (two) times daily.   20 tablet   0   . ergocalciferol (VITAMIN D2) 50000 UNITS capsule   Oral   Take 50,000 Units by mouth every Monday.         . esomeprazole (NEXIUM) 20 MG capsule   Oral   Take 20 mg by mouth daily before breakfast.         . gabapentin (NEURONTIN) 400 MG capsule   Oral   Take 400 mg by mouth 3 (three) times daily.          Marland Kitchen HYDROcodone-acetaminophen (NORCO/VICODIN) 5-325 MG per tablet   Oral   Take 1 tablet by mouth every 6 (six) hours as needed for pain.   30  tablet   0   . Ipratropium-Albuterol (COMBIVENT RESPIMAT) 20-100 MCG/ACT AERS respimat   Inhalation   Inhale 1 puff into the lungs every 6 (six) hours.         Marland Kitchen loratadine (CLARITIN) 10 MG tablet   Oral   Take 10 mg by mouth every morning.          . metFORMIN (GLUCOPHAGE) 500 MG tablet   Oral   Take 500 mg by mouth 2 (two) times daily with a meal.          . methocarbamol (ROBAXIN) 500 MG tablet   Oral   Take 1 tablet (500 mg total) by mouth 2 (two) times daily.   20 tablet   0   . metoprolol tartrate (LOPRESSOR) 25 MG tablet   Oral   Take 1 tablet (25 mg total) by mouth 2 (two) times daily.   60 tablet   0   . olmesartan (BENICAR) 40 MG tablet   Oral   Take 40 mg by mouth daily.         . ondansetron (ZOFRAN) 4 MG tablet   Oral   Take 1 tablet (4 mg total) by mouth every 6 (six) hours.   12 tablet   0   . oxyCODONE-acetaminophen (PERCOCET/ROXICET) 5-325 MG per tablet   Oral   Take 1-2 tablets by mouth every 6 (six) hours as needed (for breakthrough pain if the Lortab 3 times a day is not enough).   10 tablet   0   . simvastatin (ZOCOR) 20 MG tablet   Oral   Take 20 mg by mouth every evening.         . traZODone (DESYREL) 50 MG tablet   Oral   Take 1 tablet (50 mg total) by mouth at bedtime.   30 tablet   0   . vancomycin (VANCOCIN HCL) 250 MG capsule   Oral   Take 1 capsule (250 mg total) by mouth 4 (four) times daily.   40 capsule   0    BP 131/78  Pulse 103  Temp(Src) 99.6 F (37.6 C) (Oral)  Resp 18  SpO2 100% Physical Exam  Nursing note and vitals reviewed. Constitutional:  Pt rolling back and forth on exam bed, rubbing abdomen   HENT:  Head: Normocephalic and atraumatic.  Eyes: Conjunctivae are normal. No scleral  icterus.  Neck: Normal range of motion. Neck supple.  Cardiovascular: Normal rate, regular rhythm and normal heart sounds.   Pulmonary/Chest: Effort normal and breath sounds normal. No respiratory distress. She has  no wheezes. She has no rales. She exhibits no tenderness.  Abdominal: Soft. She exhibits no distension and no mass. Bowel sounds are increased. There is generalized tenderness. There is no rebound and no guarding.  Musculoskeletal: Normal range of motion.  Neurological: She is alert.  Skin: Skin is warm and dry.    ED Course  Procedures (including critical care time) Labs Reviewed  COMPREHENSIVE METABOLIC PANEL - Abnormal; Notable for the following:    Glucose, Bld 125 (*)    All other components within normal limits  CLOSTRIDIUM DIFFICILE BY PCR  URINALYSIS, ROUTINE W REFLEX MICROSCOPIC  CBC WITH DIFFERENTIAL   No results found. No diagnosis found.  MDM  Pt has hx of recurrent C. Diff.  States she is unable to get Vanc which was prescribed last week until tomorrow.  Will control pt's pain, give fluids, get labs: CBC, CMP, UA, C. Diff PCR.  UA: not concerning for UTI.  CMP: unremarkable CBC: lab error, did not come back for 2 hours.  Discussed with Dr. Radford Pax who did not believe this was necessary to discharge pt.   Tx in ED: zofran, metronidazole, acetaminophen  Will discharge pt home and have her f/u with Dr. Mayford Knife. Return precautions given.    Discussed pt with attending during ED encounter.   Junius Finner, PA-C 05/27/13 1506  Addend: was informed by Dr. Ethelda Chick, after discharge papers were printed and plan was to discharge pt, she began to have profuse diarrhea, unable to get out of bed, pt was admitted for symptomatic treatment.    Junius Finner, PA-C 05/27/13 1551

## 2013-05-26 NOTE — H&P (Signed)
Date: 05/26/2013               Patient Name:  [name] [name] MRN: [id number]  DOB: 1951-05-10 Age / Sex: 62 y.o., female   PCP: [name], MD         Medical Service: Internal Medicine Teaching Service         Attending Physician: Dr. Kem Kays    First Contact: Dr. Mariea Clonts Pager: [phone]  Second Contact: Dr. Everardo Beals Pager: [phone]       After Hours (After 5p/  First Contact Pager: [phone]  weekends / holidays): Second Contact Pager: [phone]   Chief Complaint: Abdominal pain & diarrhea  History of Present Illness: 62 yo female with a hx of Lung ca Rt uppr lobe, Bipolar and anxiety dsd, enteritis due to c diff two months ago, with a remote hx of drug abuse..   Patient presented with worsening hx of generalized abdominal pain this morning, 10 out of 10 pain,initially intermittent and now constant, associated with vomiting and diarrhea.  Emesis is described as Trumbo and non bloody, and has been present for the past two days. Diarrhea is described as watery, mucoid with some blood (more than streaks, but not voluminous), has had 13 episodes on the day of admission.  Px reports she had the same exact symptoms when she was treated for C.diff twice this year for which was hospitalized and place on Antibiotics, she reports that she was compliant with her medications.  She notes associated chills and subjective fever.  Of note, her last heroine use- 15 yrs ago, Last use of crack and alcohol intake- 5 yrs ago.  Review of Systems: Respiratory: no difficult ybreathing, though she reports some cough over the las few days,productive of white sputum. Cardiovascular: no chest pain, dizziness or leg swelling. Genitourinary:no dysuria, hematuria.  Neurological: no headaches,dizziness, or seizures.   Meds: Current Facility-Administered Medications  Medication Dose Route Frequency Provider Last Rate Last Dose  . HYDROmorphone (DILAUDID) injection 1 mg  1 mg Intravenous Once Doug Sou, MD       . iohexol (OMNIPAQUE) 300 MG/ML solution 25 mL  25 mL Oral Q1 Hr x 2 Medication Radiologist, MD      . promethazine (PHENERGAN) injection 25 mg  25 mg Intramuscular Once Doug Sou, MD       Medication Sig  . ALPRAZolam (XANAX) 0.5 MG tablet Take 0.5 mg by mouth 2 (two) times daily.   Marland Kitchen aspirin EC 81 MG tablet Take 81 mg by mouth daily.  Marland Kitchen dicyclomine (BENTYL) 20 MG tablet Take 1 tablet (20 mg total) by mouth 2 (two) times daily.  . ergocalciferol (VITAMIN D2) 50000 UNITS capsule Take 50,000 Units by mouth every Monday.  . esomeprazole (NEXIUM) 20 MG capsule Take 20 mg by mouth daily before breakfast.  . gabapentin (NEURONTIN) 400 MG capsule Take 400 mg by mouth 3 (three) times daily.   Marland Kitchen HYDROcodone-acetaminophen (NORCO/VICODIN) 5-325 MG per tablet Take 1 tablet by mouth every 6 (six) hours as needed for pain.  . Ipratropium-Albuterol (COMBIVENT RESPIMAT) 20-100 MCG/ACT AERS respimat Inhale 1 puff into the lungs every 6 (six) hours.  Marland Kitchen loratadine (CLARITIN) 10 MG tablet Take 10 mg by mouth every morning.   . metFORMIN (GLUCOPHAGE) 500 MG tablet Take 500 mg by mouth 2 (two) times daily with a meal.   . methocarbamol (ROBAXIN) 500 MG tablet Take 1 tablet (500 mg total) by mouth 2 (two) times daily.  . metoprolol tartrate (LOPRESSOR) 25 MG tablet  Take 1 tablet (25 mg total) by mouth 2 (two) times daily.  Marland Kitchen olmesartan (BENICAR) 40 MG tablet Take 40 mg by mouth daily.  . ondansetron (ZOFRAN) 4 MG tablet Take 1 tablet (4 mg total) by mouth every 6 (six) hours.  Marland Kitchen oxyCODONE-acetaminophen (PERCOCET/ROXICET) 5-325 MG per tablet Take 1-2 tablets by mouth every 6 (six) hours as needed for pain (for breakthrough pain if the Lortab 3 times a day is not enough).  . simvastatin (ZOCOR) 20 MG tablet Take 20 mg by mouth every evening.  . vancomycin (VANCOCIN HCL) 250 MG capsule Take 1 capsule (250 mg total) by mouth 4 (four) times daily.  Marland Kitchen acetaminophen (TYLENOL) 500 MG tablet Take 1 tablet (500 mg  total) by mouth every 6 (six) hours as needed for pain.  . metroNIDAZOLE (FLAGYL) 500 MG tablet Take 1 tablet (500 mg total) by mouth 3 (three) times daily.  . ondansetron (ZOFRAN ODT) 4 MG disintegrating tablet Take 1 tablet (4 mg total) by mouth every 8 (eight) hours as needed for nausea.  . traZODone (DESYREL) 50 MG tablet Take 1 tablet (50 mg total) by mouth at bedtime.    Allergies: Allergies as of 05/26/2013 - Review Complete 05/26/2013  Allergen Reaction Noted  . Haldol (haloperidol decanoate) Other (See Comments) 11/09/2011  . Metronidazole Other (See Comments) 05/21/2013  . Morphine and related Hives 11/09/2011  . Penicillins Rash 10/08/2012   Past Medical History  Diagnosis Date  . Hypertension   . Diabetes mellitus   . Bipolar 1 disorder   . Heart murmur   . Sciatica   . Arthritis   . Anxiety   . Diverticulosis   . Chronic low back pain   . DDD (degenerative disc disease), lumbar   . Chronic shoulder pain   . Chronic neck pain   . Hepatitis B   . Hepatitis C   . C. difficile diarrhea     April and February 2014   Past Surgical History  Procedure Laterality Date  . Facial tumor removal    . Brain tumor removal    . Brain surgery      in [place]  . Video bronchoscopy  12/04/2012    Procedure: VIDEO BRONCHOSCOPY;  Surgeon: Delight Ovens, MD;  Location: [facility] OR;  Service: Thoracic;  Laterality: N/A;  . Video assisted thoracoscopy (vats)/wedge resection  12/04/2012    Procedure: VIDEO ASSISTED THORACOSCOPY (VATS)/WEDGE RESECTION;  Surgeon: Delight Ovens, MD;  Location: [facility] OR;  Service: Thoracic;  Laterality: Right;  . Lobectomy  12/04/2012    Procedure: LOBECTOMY;  Surgeon: Delight Ovens, MD;  Location: [facility] OR;  Service: Thoracic;  Laterality: Right;  completion of right upper lobectomy and lymph node disection, placement of on q pump   Family History  Problem Relation Age of Onset  . Hypertension Father     deceased  . Diabetes Father   . Heart  disease Father   . Hyperlipidemia Father   . Hypertension Mother   . Hyperlipidemia Mother   . Diabetes Mother   . Hypertension Sister   . Hyperlipidemia Sister   . Hypertension Brother   . Hyperlipidemia Brother    History   Social History  . Marital Status: Widowed    Spouse Name: N/A    Number of Children: N/A  . Years of Education: N/A   Occupational History  . n/a     patient draws SNN/SSI   Social History Main Topics  . Smoking status: Current  Some Day Smoker -- 10 years    Types: Cigarettes  . Smokeless tobacco: Never Used     Comment: Smokes 1/2 pack per week  . Alcohol Use: No     Comment: quit 32yrs ago  . Drug Use: Yes    Special: Hydrocodone, Marijuana     Comment: 3 weeks ago  . Sexually Active: No   Other Topics Concern  . Not on file   Social History Narrative  . No narrative on file    Physical Exam: Blood pressure 133/100, pulse 104, temperature 98.7 F (37.1 C), temperature source Oral, resp. rate 20, SpO2 100.00%. BP 133/100  Pulse 104  Temp(Src) 98.7 F (37.1 C) (Oral)  Resp 20  SpO2 100%  General Appearance:    Alert, cooperative, in severe pain, restless, appears stated age  Head:    Normocephalic, without obvious abnormality, atraumatic  Throat:   Lips, mucosa, and tongue normal; teeth and gums normal  Back:     Symmetric, no curvature, ROM normal, no CVA tenderness  Lungs:     Clear to auscultation bilaterally, respirations unlabored  Chest Wall:    No tenderness or deformity   Heart:    tachycardic and rhythm, S1 and S2 normal, no murmur, rub   or gallop  Abdomen:     Soft, diffusely tender, bowel sounds active all four quadrants,    papalpable mass in RUQ, spleen not enlarged.  Rectal:    No ulcers, skin tags or fissures, Normal sphincter tone,rectal vault is empty,gloved finger not stained with feaces, but had some mucus. Later patient had any episode of bloody stool that was bright red.  Extremities:   Extremities normal,  atraumatic, no cyanosis or edema  Neurologic:   Oriented to TPP, CNII-XII intact.    Lab results: Basic Metabolic Panel:  Recent Labs  16/10/96 1310  NA 139  K 3.6  CL 102  CO2 27  GLUCOSE 125*  BUN 12  CREATININE 0.65  CALCIUM 9.1  AG 10  Lactic Acid 1.1  Liver Function Tests:  Recent Labs  05/26/13 1310  AST 18  ALT 15  ALKPHOS 65  BILITOT 0.5  PROT 6.5  ALBUMIN 3.5    Recent Labs  05/26/13 1729  LIPASE 15   CBC:  Recent Labs  05/26/13 2002  WBC 7.7  NEUTROABS 4.8  HGB 13.6  HCT 41.3  MCV 85.2  PLT 274   Urine Drug Screen:  05/26/2013 14:22  Amphetamines NONE DETECTED  Barbiturates NONE DETECTED  Benzodiazepines POSITIVE (A)  Opiates POSITIVE (A)  COCAINE NONE DETECTED  Tetrahydrocannabinol POSITIVE (A)      Urinalysis:  Recent Labs  05/26/13 1422  COLORURINE AMBER*  LABSPEC 1.031*  PHURINE 7.5  GLUCOSEU NEGATIVE  HGBUR NEGATIVE  BILIRUBINUR SMALL*  KETONESUR NEGATIVE  PROTEINUR NEGATIVE  UROBILINOGEN 0.2  NITRITE NEGATIVE  LEUKOCYTESUR TRACE*    03/07/2013 13:32  Hgb urine dipstick NEGATIVE  Urine-Other LESS THAN 10 mL OF URINE SUBMITTED  WBC, UA 0-2  RBC / HPF 0-2  Squamous Epithelial / LPF FEW (A)  Bacteria, UA RARE  Casts HYALINE CASTS (A)    C.Diff: Negative  Imaging results: No imaging at admission.  Other results: EKG: no EKG at admission  Assessment & Plan by Problem: Principal Problem:   Nausea vomiting and diarrhea Active Problems:   Abdominal pain   Hepatitis C   Hepatitis B   Hypertension   Diabetes mellitus  # Abdominal Pain- severity of the pain, suddeness,  pain worse in the lower abdomen with associated bloody diarrhea and vomiting is suspicious for Diverticulitis. Ischemic colitis- is also concerning due to presence of abdominal pain, bright red blood per rectum, but reassuringly, lactic acid is wnl.  CBC does not reveal leukocytosis or anemia. Alcoholic Pancreatitis - also a concern, as patient  has generalized tenderness,but no previous hx of colicky abd pain and hx of alcohol intake is remote, Lipase and LFTs are WNL. Presentation was concerning for C. Diff, considering patients 2 previous episodes of C. Diff, requiring hospitalization, but Stool for C.diff was negative.  Finally, given history of lung cancer with mucin containing cells, there was once concern for GI malignancy, but PET was negative.  Of note, patient follows with Dr. [name] of [facility] if consult warranted. -Admit to tele (obs) -NPO  - Abd Ct   - IVF  -IV Hydromorphone. - Hold antibiotics for now, but draw blood cultures & start cipro/flagyl if deteriorates  # DM2-  Currently on metformin, Currently controlled with , HBA1C- 6.1, 03/08/2013. -Hold metformin during hospitalization -CBGs q6h -SSI sensitive  #HTN: Home regimen includes metoprolol & benicar -hold benicar while NPO  Dispo: Disposition is deferred at this time, awaiting ima improvement of current medical problems.  The patient does have a current PCP [name], MD) and does not need an [facility] hospital follow-up appointment after discharge.  The patient does not have transportation limitations that hinder transportation to clinic appointments.  Signed: Kennis Carina, MD 05/26/2013, 8:03 PM

## 2013-05-26 NOTE — ED Notes (Signed)
Pt could not get anbx from pharmacy for c-diff and symptoms are getting worse.  Abdominal pain and diarrhea

## 2013-05-27 ENCOUNTER — Encounter (HOSPITAL_COMMUNITY): Payer: Self-pay | Admitting: General Practice

## 2013-05-27 ENCOUNTER — Observation Stay (HOSPITAL_COMMUNITY): Payer: Medicaid Other

## 2013-05-27 DIAGNOSIS — K5289 Other specified noninfective gastroenteritis and colitis: Secondary | ICD-10-CM | POA: Diagnosis present

## 2013-05-27 LAB — CBC
MCH: 27.7 pg (ref 26.0–34.0)
MCV: 83.4 fL (ref 78.0–100.0)
Platelets: 261 10*3/uL (ref 150–400)
RDW: 15.4 % (ref 11.5–15.5)

## 2013-05-27 LAB — GLUCOSE, CAPILLARY: Glucose-Capillary: 124 mg/dL — ABNORMAL HIGH (ref 70–99)

## 2013-05-27 LAB — MAGNESIUM: Magnesium: 1.7 mg/dL (ref 1.5–2.5)

## 2013-05-27 LAB — COMPREHENSIVE METABOLIC PANEL
AST: 26 U/L (ref 0–37)
Albumin: 3.7 g/dL (ref 3.5–5.2)
Alkaline Phosphatase: 66 U/L (ref 39–117)
BUN: 13 mg/dL (ref 6–23)
CO2: 27 mEq/L (ref 19–32)
Chloride: 104 mEq/L (ref 96–112)
Potassium: 3.3 mEq/L — ABNORMAL LOW (ref 3.5–5.1)
Total Bilirubin: 1.2 mg/dL (ref 0.3–1.2)

## 2013-05-27 LAB — PHOSPHORUS: Phosphorus: 3.3 mg/dL (ref 2.3–4.6)

## 2013-05-27 LAB — LACTIC ACID, PLASMA: Lactic Acid, Venous: 0.9 mmol/L (ref 0.5–2.2)

## 2013-05-27 MED ORDER — IOHEXOL 300 MG/ML  SOLN
100.0000 mL | Freq: Once | INTRAMUSCULAR | Status: AC | PRN
  Administered 2013-05-27: 100 mL via INTRAVENOUS

## 2013-05-27 MED ORDER — HYDROMORPHONE HCL PF 1 MG/ML IJ SOLN: 2.0000 mg | INTRAMUSCULAR | Status: DC | PRN

## 2013-05-27 MED ORDER — CIPROFLOXACIN IN D5W 400 MG/200ML IV SOLN
400.0000 mg | Freq: Two times a day (BID) | INTRAVENOUS | Status: DC
  Administered 2013-05-27 – 2013-05-29 (×4): 400 mg via INTRAVENOUS
  Filled 2013-05-27 (×5): qty 200

## 2013-05-27 MED ORDER — IPRATROPIUM-ALBUTEROL 20-100 MCG/ACT IN AERS: 1.0000 | INHALATION_SPRAY | Freq: Four times a day (QID) | RESPIRATORY_TRACT | Status: DC | PRN

## 2013-05-27 MED ORDER — HYDROMORPHONE HCL PF 1 MG/ML IJ SOLN
1.0000 mg | INTRAMUSCULAR | Status: DC | PRN
  Administered 2013-05-27: 1 mg via INTRAVENOUS
  Administered 2013-05-27 – 2013-05-29 (×10): 2 mg via INTRAVENOUS
  Administered 2013-05-29: 1 mg via INTRAVENOUS
  Administered 2013-05-29: 2 mg via INTRAVENOUS
  Filled 2013-05-27 (×12): qty 2
  Filled 2013-05-27: qty 1

## 2013-05-27 MED ORDER — LORAZEPAM 2 MG/ML IJ SOLN
1.0000 mg | Freq: Once | INTRAMUSCULAR | Status: AC
  Administered 2013-05-27: 1 mg via INTRAVENOUS
  Filled 2013-05-27: qty 1

## 2013-05-27 MED ORDER — HYDROMORPHONE HCL PF 1 MG/ML IJ SOLN
1.0000 mg | INTRAMUSCULAR | Status: DC | PRN
  Administered 2013-05-27 (×4): 1 mg via INTRAVENOUS
  Filled 2013-05-27 (×4): qty 1

## 2013-05-27 MED ORDER — ONDANSETRON HCL 4 MG/2ML IJ SOLN
4.0000 mg | Freq: Four times a day (QID) | INTRAMUSCULAR | Status: DC | PRN
  Administered 2013-05-27 – 2013-05-28 (×3): 4 mg via INTRAVENOUS
  Filled 2013-05-27 (×4): qty 2

## 2013-05-27 MED ORDER — HYDROMORPHONE HCL PF 1 MG/ML IJ SOLN
INTRAMUSCULAR | Status: AC
  Filled 2013-05-27: qty 2

## 2013-05-27 MED ORDER — HYDROMORPHONE HCL PF 1 MG/ML IJ SOLN
2.0000 mg | Freq: Once | INTRAMUSCULAR | Status: AC
  Administered 2013-05-27: 2 mg via INTRAVENOUS
  Filled 2013-05-27: qty 2

## 2013-05-27 MED ORDER — SODIUM CHLORIDE 0.9 % IV SOLN
INTRAVENOUS | Status: DC
  Administered 2013-05-27 (×2): via INTRAVENOUS
  Filled 2013-05-27 (×4): qty 1000

## 2013-05-27 MED ORDER — PROMETHAZINE HCL 25 MG/ML IJ SOLN: 12.5000 mg | INTRAMUSCULAR | Status: DC | PRN

## 2013-05-27 MED ORDER — IRBESARTAN 300 MG PO TABS
300.0000 mg | ORAL_TABLET | Freq: Every day | ORAL | Status: DC
  Administered 2013-05-27 – 2013-05-31 (×5): 300 mg via ORAL
  Filled 2013-05-27 (×5): qty 1

## 2013-05-27 MED ORDER — POTASSIUM CHLORIDE CRYS ER 20 MEQ PO TBCR
40.0000 meq | EXTENDED_RELEASE_TABLET | ORAL | Status: AC
  Administered 2013-05-27 (×2): 40 meq via ORAL
  Filled 2013-05-27 (×2): qty 2

## 2013-05-27 MED ORDER — HYDRALAZINE HCL 20 MG/ML IJ SOLN: 10.0000 mg | Freq: Four times a day (QID) | INTRAMUSCULAR | Status: DC | PRN

## 2013-05-27 NOTE — Progress Notes (Signed)
Pt experienced 11-beat run of Vtach. On call MD notified

## 2013-05-27 NOTE — ED Provider Notes (Signed)
Medical screening examination/treatment/procedure(s) were performed by non-physician practitioner and as supervising physician I was immediately available for consultation/collaboration.   Nelia Shi, MD 05/27/13 [phone]

## 2013-05-27 NOTE — Progress Notes (Signed)
Admitted pt.to 5w 14;from ED;restless ,;Dr.made aware & said they will be coming to see her.V/S taken & made comfortable in bed.Unable to do admission HX at this time since she is very restless.Placed pt.on TELE.as ordered.Will continue to monitor pt.

## 2013-05-27 NOTE — H&P (Signed)
See my separate admission note.

## 2013-05-27 NOTE — Progress Notes (Signed)
Subjective: Complaints of persistent abdominal pain, despite- Dilaudid-1mg  Q3H, PRN. She reports having 3 episodes of bloody diarrhoea today. Nurse reports episode of Vent tachycardia (11 beats) , but patient was stable, with pulse, and spontaneously converted to sinus rhythm.  Objective: Vital signs in last 24 hours: Filed Vitals:   05/27/13 0300 05/27/13 0302 05/27/13 0515 05/27/13 1006  BP: 163/97 0/0 179/96 176/102  Pulse: 101  96 90  Temp: 98.6 F (37 C)  97.8 F (36.6 C)   TempSrc: Oral  Oral   Resp: 18  20   Height:   5\' 4"  (1.626 m)   Weight:   63.8 kg (140 lb 10.5 oz)   SpO2: 94%  96%    Weight change:   Intake/Output Summary (Last 24 hours) at 05/27/13 1324 Last data filed at 05/27/13 0930  Gross per 24 hour  Intake 435.42 ml  Output      0 ml  Net 435.42 ml   General appearance: alert, cooperative and moderate distress- Abdominal pain. Head: Normocephalic, without obvious abnormality, atraumatic Lungs: clear to auscultation bilaterally Heart: regular rate and rhythm, S1, S2 normal, no murmur, click, rub or gallop Abdomen: soft, non-tender; bowel sounds normal; no masses,  no organomegaly. Pulses: 2+ and symmetric Lab Results: Basic Metabolic Panel:  Recent Labs Lab 05/26/13 1310 05/27/13 0107 05/27/13 0444  NA 139  --  141  K 3.6  --  3.3*  CL 102  --  104  CO2 27  --  27  GLUCOSE 125*  --  133*  BUN 12  --  13  CREATININE 0.65  --  0.50  CALCIUM 9.1  --  9.4  MG  --  1.7  --   PHOS  --  3.3  --    Liver Function Tests:  Recent Labs Lab 05/26/13 1310 05/27/13 0444  AST 18 26  ALT 15 18  ALKPHOS 65 66  BILITOT 0.5 1.2  PROT 6.5 7.1  ALBUMIN 3.5 3.7    Recent Labs Lab 05/26/13 1729  LIPASE 15   No results found for this basename: AMMONIA,  in the last 168 hours CBC:  Recent Labs Lab 05/21/13 1132 05/26/13 2002 05/27/13 0444  WBC 8.1 7.7 11.5*  NEUTROABS 4.1 4.8  --   HGB 12.8 13.6 14.0  HCT 40.1 41.3 42.1  MCV 85.5 85.2  83.4  PLT 270 274 261   Urine Drug Screen: Drugs of Abuse     Component Value Date/Time   LABOPIA POSITIVE* 05/26/2013 1422   COCAINSCRNUR NONE DETECTED 05/26/2013 1422   LABBENZ POSITIVE* 05/26/2013 1422   AMPHETMU NONE DETECTED 05/26/2013 1422   THCU POSITIVE* 05/26/2013 1422   LABBARB NONE DETECTED 05/26/2013 1422    Urinalysis:  Recent Labs Lab 05/21/13 1226 05/26/13 1422  COLORURINE YELLOW AMBER*  LABSPEC 1.018 1.031*  PHURINE 7.5 7.5  GLUCOSEU NEGATIVE NEGATIVE  HGBUR NEGATIVE NEGATIVE  BILIRUBINUR NEGATIVE SMALL*  KETONESUR NEGATIVE NEGATIVE  PROTEINUR NEGATIVE NEGATIVE  UROBILINOGEN 0.2 0.2  NITRITE NEGATIVE NEGATIVE  LEUKOCYTESUR SMALL* TRACE*   Micro Results: Recent Results (from the past 240 hour(s))  CLOSTRIDIUM DIFFICILE BY PCR     Status: None   Collection Time    05/26/13  2:31 PM      Result Value Range Status   C difficile by pcr NEGATIVE  NEGATIVE Final   Studies/Results: Ct Abdomen Pelvis W Contrast  05/27/2013   *RADIOLOGY REPORT*  Clinical Data:  IMPRESSION: Diffuse colonic edema suggesting nonspecific colitis.  Original Report Authenticated By: Burman Nieves, M.D.   Medications: I have reviewed the patient's current medications. Scheduled Meds: . aspirin EC  81 mg Oral Daily  . insulin aspart  0-9 Units Subcutaneous Q6H  . Ipratropium-Albuterol  1 puff Inhalation Q6H  . metoprolol tartrate  25 mg Oral BID  . metronidazole  500 mg Intravenous Q8H  . potassium chloride SA  40 mEq Oral Q4H  . sodium chloride  3 mL Intravenous Q12H   Continuous Infusions: . sodium chloride 0.9 % 1,000 mL with potassium chloride 20 mEq infusion 125 mL/hr at 05/27/13 1234   PRN Meds:.HYDROmorphone (DILAUDID) injection, ondansetron (ZOFRAN) IV Assessment/Plan: Principal Problem:   Other and unspecified noninfectious gastroenteritis and colitis(558.9) Active Problems:   Nausea vomiting and diarrhea   Abdominal pain   Hepatitis C   Hepatitis B   Hypertension    Diabetes mellitus  #Other and unspecified noninfectious gastroenteritis-  Abdominal Pain and nausea, vomiting and diarrhea- likely due to Infectious colitis due to C. Diff, Despite Neg Stool C. Diff, but abd CT showed- diffuse edema of the wall of the colon, ?pseudomembranous colitis,infectious or inflammatory. Also patient has 2 previous episodes of C. Diff for which she was treated this year. Acute mesenteric ischemia and Ischemic colitis less likely- Considering normal anion gap, and normal Lactic acid levels. - NPO -Dilaudid increased to 2mg  Q3h -IV metronidazole. -IV ciprofloxacin -Mag, BMP, CBC -Patient had an episode of V tach, K levels this am-3.3, -considering the patient has had several episodes of diarrhoea [name] being placed on potassium- X 2 given orally, and                                                              - IV KCL + NACL 0.9% . -Telemetry.  # DM2- Currently on metformin, Currently controlled with , HBA1C- 6.1, 03/08/2013. Blood glucose- 133 -Hold metformin during hospitalization  -CBGs q6h  -SSI sensitive  #HTN: Home regimen includes metoprolol & benicar. Antonietta Jewel 300mg     Dispo: Disposition is deferred at this time, awaiting improvement of current medical problems.   The patient does have a current PCP [name], MD) and does need an [facility] hospital follow-up appointment after discharge.  .Services Needed at time of discharge: Y = Yes, Blank = No PT:   OT:   RN:   Equipment:   Other:     LOS: 1 day   Kennis Carina, MD 05/27/2013, 1:24 PM

## 2013-05-27 NOTE — Progress Notes (Signed)
Notified by nurse that Ms. Nourse was restless in bed and she did not want to go down for CT at this time because of discomfort.  Ms. Lampert was seen and examined by Dr. Burtis Junes and I.   S: Ms. Hendricksen reports that she is still experiencing abdominal pain and that it is more painful to lay still.  She has a bag for emesis next to her in bed. O: PM vitals - T 99.7 po, RR 18, HR 94, BP 179/95, pOx 96% General - She was writhing in bed and appears to be in pain. Abdominal exam - soft, non-tender, nondistended, +bowel sounds, no guarding or rebound tenderness, one episode of non-bloody emesis during exam.  A/P:   1. Abdominal pain - since patient was in significant distress called her daughter to establish baseline.  Daughter reports that this is a similar presentation to how patient reacted to multiple c.diff infections.  Daughter also reported that patient's granddaughter, whom she also lives with, has come down with GI illness and fever.  She reports that patient was tolerating normal diet today and was taking opiates for pain management but unsure how much.  She had not heard that the patient complained of bloody stools nor witnessed her vomiting.  [name] hemodynamically stable although shows physiologic signs of pain, abdominal exam is benign and doesn't appear to be surgical.  Patient does not appear to have obstructive symptoms to indicate to SBO.  She has no leukocytosis or anemia.  Lactate, LFTs and lipase are WNL.  She is c.diff negative.  Radiology consulted to determine best imaging modality in light of patient's inability to tolerate laying flat and differential including ischemic colitis vs. infectious colitis vs. diverticulitis vs. Ileus vs. refractory c.diff infx.  Pt lost IV access and therefore there was significant delay in our ability to manage pain or pursue imaging until this could be re-established.  Pt was vomiting and NPO.   - Ativan and Dilaudid for pain/agitation  - abdominal CT -  consider abdominal series/KUB if unable to obtain IV access - Blood cx - Cipro and Flagyl for possible diverticulitis - if patient decompensates or develops surgical abdomen will consult surgery or GI if non-surgical but refractory to treatment (followed by Dr. [name] with [facility]). - both nurse and daughter were updated with the plan.

## 2013-05-27 NOTE — Consult Note (Signed)
[facility] CONSULT Reason for consult: Abdominal Pain and Diarrhea Referring Physician: [facility]  [name] [name] is an 62 y.o. female.  HPI: 62 year old who has a history of previous C. difficile diarrhea of colon polyps. She also has lung cancer and apparently is not resectable. She has a history of hepatitis C., depression and anxiety, substance abuse, type II diabetes. She apparently has had previous problems with abdominal pain and we have removed a colon polyp in the past. She reports that she had been doing well to last week when she developed abdominal pain and diarrhea. C. difficile toxin was negative. CT scan showed diffusely thickened: and she is currently on Cipro and Flagyl. G.I. pathogens panel is pending. Were asked to see her for evaluation for her diarrhea and abdominal pain. Labs are remarkable for slightly elevated WBC. Cultures are still pending  Past Medical History  Diagnosis Date  . Hypertension   . Diabetes mellitus   . Bipolar 1 disorder   . Heart murmur   . Sciatica   . Arthritis   . Anxiety   . Diverticulosis   . Chronic low back pain   . DDD (degenerative disc disease), lumbar   . Chronic shoulder pain   . Chronic neck pain   . Hepatitis B     Unclear when initially diagnosed, labs in Epic from 03/09/13  . Hepatitis C     Unclear when initially diagnosed, labs in Epic from 03/09/13  . C. difficile diarrhea     April and February 2014    Past Surgical History  Procedure Laterality Date  . Facial tumor removal    . Brain tumor removal    . Brain surgery      in [place]  . Video bronchoscopy  12/04/2012    Procedure: VIDEO BRONCHOSCOPY;  Surgeon: Delight Ovens, MD;  Location: [facility] OR;  Service: Thoracic;  Laterality: N/A;  . Video assisted thoracoscopy (vats)/wedge resection  12/04/2012    Procedure: VIDEO ASSISTED THORACOSCOPY (VATS)/WEDGE RESECTION;  Surgeon: Delight Ovens, MD;  Location: [facility] OR;  Service: Thoracic;   Laterality: Right;  . Lobectomy  12/04/2012    Procedure: LOBECTOMY;  Surgeon: Delight Ovens, MD;  Location: [facility] OR;  Service: Thoracic;  Laterality: Right;  completion of right upper lobectomy and lymph node disection, placement of on q pump    Family History  Problem Relation Age of Onset  . Hypertension Father     deceased  . Diabetes Father   . Heart disease Father   . Hyperlipidemia Father   . Hypertension Mother   . Hyperlipidemia Mother   . Diabetes Mother   . Hypertension Sister   . Hyperlipidemia Sister   . Hypertension Brother   . Hyperlipidemia Brother     Social History:  reports that she has been smoking Cigarettes.  She has been smoking about 0.00 packs per day for the past 10 years. She has never used smokeless tobacco. She reports that she uses illicit drugs (Hydrocodone and Marijuana). She reports that she does not drink alcohol.  Allergies:  Allergies  Allergen Reactions  . Haldol (Haloperidol Decanoate) Other (See Comments)    tongue swelling  . Metronidazole Other (See Comments)    Severe stomach upset--was instructed to avoid Flagyl.  . Morphine And Related Hives    hives  . Penicillins Rash    Medications; . aspirin EC  81 mg Oral Daily  . insulin aspart  0-9 Units Subcutaneous Q6H  . Ipratropium-Albuterol  1 puff Inhalation Q6H  . metoprolol tartrate  25 mg Oral BID  . metronidazole  500 mg Intravenous Q8H  . potassium chloride SA  40 mEq Oral Q4H  . sodium chloride  3 mL Intravenous Q12H   PRN Meds HYDROmorphone (DILAUDID) injection, ondansetron (ZOFRAN) IV Results for orders placed during the hospital encounter of 05/26/13 (from the past 48 hour(s))  COMPREHENSIVE METABOLIC PANEL     Status: Abnormal   Collection Time    05/26/13  1:10 PM      Result Value Range   Sodium 139  135 - 145 mEq/L   Potassium 3.6  3.5 - 5.1 mEq/L   Chloride 102  96 - 112 mEq/L   CO2 27  19 - 32 mEq/L   Glucose, Bld 125 (*) 70 - 99 mg/dL   BUN 12  6 - 23  mg/dL   Creatinine, Ser 1.61  0.50 - 1.10 mg/dL   Calcium 9.1  8.4 - 09.6 mg/dL   Total Protein 6.5  6.0 - 8.3 g/dL   Albumin 3.5  3.5 - 5.2 g/dL   AST 18  0 - 37 U/L   ALT 15  0 - 35 U/L   Alkaline Phosphatase 65  39 - 117 U/L   Total Bilirubin 0.5  0.3 - 1.2 mg/dL   GFR calc non Af Amer >90  >90 mL/min   GFR calc Af Amer >90  >90 mL/min   Comment:            The eGFR has been calculated     using the CKD EPI equation.     This calculation has not been     validated in all clinical     situations.     eGFR's persistently     <90 mL/min signify     possible Chronic Kidney Disease.  URINALYSIS, ROUTINE W REFLEX MICROSCOPIC     Status: Abnormal   Collection Time    05/26/13  2:22 PM      Result Value Range   Color, Urine AMBER (*) YELLOW   Comment: BIOCHEMICALS MAY BE AFFECTED BY COLOR   APPearance CLEAR  CLEAR   Specific Gravity, Urine 1.031 (*) 1.005 - 1.030   pH 7.5  5.0 - 8.0   Glucose, UA NEGATIVE  NEGATIVE mg/dL   Hgb urine dipstick NEGATIVE  NEGATIVE   Bilirubin Urine SMALL (*) NEGATIVE   Ketones, ur NEGATIVE  NEGATIVE mg/dL   Protein, ur NEGATIVE  NEGATIVE mg/dL   Urobilinogen, UA 0.2  0.0 - 1.0 mg/dL   Nitrite NEGATIVE  NEGATIVE   Leukocytes, UA TRACE (*) NEGATIVE  URINE MICROSCOPIC-ADD ON     Status: Abnormal   Collection Time    05/26/13  2:22 PM      Result Value Range   Squamous Epithelial / LPF FEW (*) RARE   WBC, UA 0-2  <3 WBC/hpf   Bacteria, UA FEW (*) RARE   Casts HYALINE CASTS (*) NEGATIVE   Comment: GRANULAR CAST   Urine-Other MUCOUS PRESENT    URINE RAPID DRUG SCREEN (HOSP PERFORMED)     Status: Abnormal   Collection Time    05/26/13  2:22 PM      Result Value Range   Opiates POSITIVE (*) NONE DETECTED   Cocaine NONE DETECTED  NONE DETECTED   Benzodiazepines POSITIVE (*) NONE DETECTED   Amphetamines NONE DETECTED  NONE DETECTED   Tetrahydrocannabinol POSITIVE (*) NONE DETECTED   Barbiturates NONE DETECTED  NONE DETECTED  Comment:             DRUG SCREEN FOR MEDICAL PURPOSES     ONLY.  IF CONFIRMATION IS NEEDED     FOR ANY PURPOSE, NOTIFY LAB     WITHIN 5 DAYS.                LOWEST DETECTABLE LIMITS     FOR URINE DRUG SCREEN     Drug Class       Cutoff (ng/mL)     Amphetamine      1000     Barbiturate      200     Benzodiazepine   200     Tricyclics       300     Opiates          300     Cocaine          300     THC              50  CLOSTRIDIUM DIFFICILE BY PCR     Status: None   Collection Time    05/26/13  2:31 PM      Result Value Range   C difficile by pcr NEGATIVE  NEGATIVE  LIPASE, BLOOD     Status: None   Collection Time    05/26/13  5:29 PM      Result Value Range   Lipase 15  11 - 59 U/L  LACTIC ACID, PLASMA     Status: None   Collection Time    05/26/13  6:44 PM      Result Value Range   Lactic Acid, Venous 1.1  0.5 - 2.2 mmol/L  CBC WITH DIFFERENTIAL     Status: Abnormal   Collection Time    05/26/13  8:02 PM      Result Value Range   WBC 7.7  4.0 - 10.5 K/uL   RBC 4.85  3.87 - 5.11 MIL/uL   Hemoglobin 13.6  12.0 - 15.0 g/dL   HCT 40.9  81.1 - 91.4 %   MCV 85.2  78.0 - 100.0 fL   MCH 28.0  26.0 - 34.0 pg   MCHC 32.9  30.0 - 36.0 g/dL   RDW 78.2 (*) 95.6 - 21.3 %   Platelets 274  150 - 400 K/uL   Neutrophils Relative % 62  43 - 77 %   Neutro Abs 4.8  1.7 - 7.7 K/uL   Lymphocytes Relative 30  12 - 46 %   Lymphs Abs 2.3  0.7 - 4.0 K/uL   Monocytes Relative 6  3 - 12 %   Monocytes Absolute 0.5  0.1 - 1.0 K/uL   Eosinophils Relative 2  0 - 5 %   Eosinophils Absolute 0.2  0.0 - 0.7 K/uL   Basophils Relative 0  0 - 1 %   Basophils Absolute 0.0  0.0 - 0.1 K/uL  GLUCOSE, CAPILLARY     Status: Abnormal   Collection Time    05/27/13 12:27 AM      Result Value Range   Glucose-Capillary 152 (*) 70 - 99 mg/dL   Comment 1 Notify RN     Comment 2 Documented in Chart    MAGNESIUM     Status: None   Collection Time    05/27/13  1:07 AM      Result Value Range   Magnesium 1.7  1.5 - 2.5 mg/dL   PHOSPHORUS     Status: None  Collection Time    05/27/13  1:07 AM      Result Value Range   Phosphorus 3.3  2.3 - 4.6 mg/dL  COMPREHENSIVE METABOLIC PANEL     Status: Abnormal   Collection Time    05/27/13  4:44 AM      Result Value Range   Sodium 141  135 - 145 mEq/L   Potassium 3.3 (*) 3.5 - 5.1 mEq/L   Chloride 104  96 - 112 mEq/L   CO2 27  19 - 32 mEq/L   Glucose, Bld 133 (*) 70 - 99 mg/dL   BUN 13  6 - 23 mg/dL   Creatinine, Ser 9.60  0.50 - 1.10 mg/dL   Calcium 9.4  8.4 - 45.4 mg/dL   Total Protein 7.1  6.0 - 8.3 g/dL   Albumin 3.7  3.5 - 5.2 g/dL   AST 26  0 - 37 U/L   ALT 18  0 - 35 U/L   Alkaline Phosphatase 66  39 - 117 U/L   Total Bilirubin 1.2  0.3 - 1.2 mg/dL   GFR calc non Af Amer >90  >90 mL/min   GFR calc Af Amer >90  >90 mL/min   Comment:            The eGFR has been calculated     using the CKD EPI equation.     This calculation has not been     validated in all clinical     situations.     eGFR's persistently     <90 mL/min signify     possible Chronic Kidney Disease.  CBC     Status: Abnormal   Collection Time    05/27/13  4:44 AM      Result Value Range   WBC 11.5 (*) 4.0 - 10.5 K/uL   RBC 5.05  3.87 - 5.11 MIL/uL   Hemoglobin 14.0  12.0 - 15.0 g/dL   HCT 09.8  11.9 - 14.7 %   MCV 83.4  78.0 - 100.0 fL   MCH 27.7  26.0 - 34.0 pg   MCHC 33.3  30.0 - 36.0 g/dL   RDW 82.9  56.2 - 13.0 %   Platelets 261  150 - 400 K/uL  GLUCOSE, CAPILLARY     Status: Abnormal   Collection Time    05/27/13  7:01 AM      Result Value Range   Glucose-Capillary 124 (*) 70 - 99 mg/dL   Comment 1 Notify RN     Comment 2 Documented in Chart      Ct Abdomen Pelvis W Contrast  05/27/2013   *RADIOLOGY REPORT*  Clinical Data:  Diffuse abdominal pain for 4 days.  Multiple episodes of diarrhea.  CT ABDOMEN AND PELVIS WITH CONTRAST  Technique:  Multidetector CT imaging of the abdomen and pelvis was performed following the standard protocol during bolus administration of  intravenous contrast.  Contrast: OMNIPAQUE IOHEXOL 300 MG/ML  SOLN  Comparison: 03/08/2013  Findings: Motion artifact in the lung bases.  Infiltration is not excluded.  Small esophageal hiatal hernia with distal esophageal wall thickening suggesting reflux esophagitis.  The liver, spleen, gallbladder, pancreas, adrenal glands, kidneys, abdominal aorta, inferior vena cava, and retroperitoneal lymph nodes are unremarkable.  The stomach and small bowel are not distended.  There is diffuse edema in the wall of the colon suggesting colitis.  This could be due to pseudomembranous colitis, infectious or inflammatory process.  No free air or free fluid in  the abdomen.  Pelvis:  Uterus and ovaries are not enlarged.  Bladder wall is not thickened.  Suggestion of a small fibroid in the uterus measuring less than 1 cm diameter.  No free or loculated pelvic fluid collections.  No evidence of diverticulitis.  The appendix is normal.  Degenerative changes in the lumbar spine.  No destructive bone lesions appreciated.  IMPRESSION: Diffuse colonic edema suggesting nonspecific colitis.   Original Report Authenticated By: Burman Nieves, M.D.               Blood pressure 176/102, pulse 90, temperature 97.8 F (36.6 C), temperature source Oral, resp. rate 20, height 5\' 4"  (1.626 m), weight 63.8 kg (140 lb 10.5 oz), SpO2 96.00%.  Physical exam:   General-- patient rolling around states that she is having pain and diarrhea and fairly uncooperative answering questions Heart-- regular rate and rhythm without murmurs are gallops Lungs--clear Abdomen-- none distended and generally soft with bowel sounds present. The patient claim she is having excruciating pain or   Assessment: 1. Abdominal pain and diarrhea. CT scan shows mild thickening of the colon. This apparently came on acutely and could be infectious or possibly ischemic. C. difficile toxins negative. She currently does not seem to be in severe  discomfort and is on antibiotics.  Plan: we will follow her with you. If he G.I. pathogens panel was negative and she is not improved, will consider colonoscopy or sigmoidoscopy to obtain colonic biopsies.   Jashan Cotten JR,Ellie Spickler L 05/27/2013, 12:00 PM

## 2013-05-27 NOTE — Progress Notes (Signed)
INTERNAL MEDICINE TEACHING SERVICE Attending Admission Note  Date: 05/27/2013  Patient name: [name] [name]  Medical record number: [id number]  Date of birth: 1951/03/19    I have seen and evaluated Maureen Chatters and discussed their care with the Residency Team.  62 yr. Old female with hx RUL Lung CA, bipolar disorder, hx C diff colitis earlier in the year s/p treatment, presented with abdominal pain and diarrhea. She was noted to have an episode of bloody diarrhea in the ED.  CT abdomen showed evidence of a diffuse colitis. She has been hemodynamically stable. No lactic acidosis. No leukocytosis on admission. Agree with continuing Flagyl. Her abdominal exam shows no evidence of guarding or distention. She has +BS. Abdomen is non-surgical. [facility] consulted. C diff sent and negative. Agree with infectious workup. Doubt ischemic in nature.  Replace K, noted episode of non-sustained Vtach. If reoccurs, will need TTE. Obtain EKG at this time. Denies CP, SOB, dizziness. Refer to resident admission H&P for further details.   Jonah Blue, [place] 7/8/20141:51 PM

## 2013-05-28 LAB — BASIC METABOLIC PANEL
BUN: 15 mg/dL (ref 6–23)
CO2: 22 mEq/L (ref 19–32)
Chloride: 108 mEq/L (ref 96–112)
Creatinine, Ser: 0.57 mg/dL (ref 0.50–1.10)

## 2013-05-28 LAB — GLUCOSE, CAPILLARY
Glucose-Capillary: 124 mg/dL — ABNORMAL HIGH (ref 70–99)
Glucose-Capillary: 89 mg/dL (ref 70–99)
Glucose-Capillary: 97 mg/dL (ref 70–99)

## 2013-05-28 LAB — CBC
HCT: 40.1 % (ref 36.0–46.0)
MCV: 85.3 fL (ref 78.0–100.0)
RDW: 15.8 % — ABNORMAL HIGH (ref 11.5–15.5)
WBC: 8.5 10*3/uL (ref 4.0–10.5)

## 2013-05-28 LAB — GI PATHOGEN PANEL BY PCR, STOOL
Campylobacter by PCR: NEGATIVE
Cryptosporidium by PCR: NEGATIVE
Norovirus GI/GII: POSITIVE
Rotavirus A by PCR: NEGATIVE

## 2013-05-28 LAB — MAGNESIUM: Magnesium: 2 mg/dL (ref 1.5–2.5)

## 2013-05-28 MED ORDER — ALPRAZOLAM 0.5 MG PO TABS
0.5000 mg | ORAL_TABLET | Freq: Two times a day (BID) | ORAL | Status: DC
  Administered 2013-05-28 – 2013-05-31 (×7): 0.5 mg via ORAL
  Filled 2013-05-28 (×7): qty 1

## 2013-05-28 MED ORDER — POTASSIUM CHLORIDE 2 MEQ/ML IV SOLN
INTRAVENOUS | Status: DC
  Administered 2013-05-28 – 2013-05-31 (×6): via INTRAVENOUS
  Filled 2013-05-28 (×9): qty 1000

## 2013-05-28 MED ORDER — TRAZODONE HCL 50 MG PO TABS: 50.0000 mg | ORAL_TABLET | Freq: Every day | ORAL | Status: DC

## 2013-05-28 MED ORDER — WHITE PETROLATUM GEL: Status: DC | PRN

## 2013-05-28 MED ORDER — WHITE PETROLATUM GEL
Status: DC | PRN
  Administered 2013-05-29: 0.2 via TOPICAL
  Filled 2013-05-28 (×2): qty 28.35

## 2013-05-28 MED ORDER — TRAZODONE HCL 50 MG PO TABS
50.0000 mg | ORAL_TABLET | Freq: Every evening | ORAL | Status: DC | PRN
  Filled 2013-05-28: qty 1

## 2013-05-28 NOTE — Progress Notes (Signed)
[facility] PROGRESS NOTE Subjective patient states that she is doing much better with less abdominal pain and less diarrhea. G.I. pathogen panel is still pending at this time  Objective: Vital signs in last 24 hours: Temp:  [98.4 F (36.9 C)-98.7 F (37.1 C)] 98.4 F (36.9 C) (07/09 0520) Pulse Rate:  [66-90] 66 (07/09 0520) Resp:  [18-20] 18 (07/09 0520) BP: (150-195)/(89-112) 160/89 mmHg (07/09 0520) SpO2:  [97 %-99 %] 98 % (07/09 0520) Weight:  [64.8 kg (142 lb 13.7 oz)-64.9 kg (143 lb 1.3 oz)] 64.9 kg (143 lb 1.3 oz) (07/09 0459) Last BM Date: 05/27/13  Intake/Output from previous day: 07/08 0701 - 07/09 0700 In: 984.2 [I.V.:584.2; IV Piggyback:400] Out: 6 [Urine:3; Stool:3] Intake/Output this shift:    PE: General-- alert and talkative  Abdomen-- none distended and soft with some hyperactive bowel sounds  Lab Results:  Recent Labs  05/26/13 2002 05/27/13 0444 05/28/13 0625  WBC 7.7 11.5* 8.5  HGB 13.6 14.0 12.8  HCT 41.3 42.1 40.1  PLT 274 261 210   BMET  Recent Labs  05/26/13 1310 05/27/13 0444 05/28/13 0625  NA 139 141 141  K 3.6 3.3* 3.8  CL 102 104 108  CO2 27 27 22   CREATININE 0.65 0.50 0.57   LFT  Recent Labs  05/26/13 1310 05/27/13 0444  PROT 6.5 7.1  AST 18 26  ALT 15 18  ALKPHOS 65 66  BILITOT 0.5 1.2   PT/INR No results found for this basename: LABPROT, INR,  in the last 72 hours PANCREAS  Recent Labs  05/26/13 1729  LIPASE 15         Studies/Results: Ct Abdomen Pelvis W Contrast  05/27/2013   *RADIOLOGY REPORT*  Clinical Data:  Diffuse abdominal pain for 4 days.  Multiple episodes of diarrhea.  CT ABDOMEN AND PELVIS WITH CONTRAST  Technique:  Multidetector CT imaging of the abdomen and pelvis was performed following the standard protocol during bolus administration of intravenous contrast.  Contrast: OMNIPAQUE IOHEXOL 300 MG/ML  SOLN  Comparison: 03/08/2013  Findings: Motion artifact in the lung bases.   Infiltration is not excluded.  Small esophageal hiatal hernia with distal esophageal wall thickening suggesting reflux esophagitis.  The liver, spleen, gallbladder, pancreas, adrenal glands, kidneys, abdominal aorta, inferior vena cava, and retroperitoneal lymph nodes are unremarkable.  The stomach and small bowel are not distended.  There is diffuse edema in the wall of the colon suggesting colitis.  This could be due to pseudomembranous colitis, infectious or inflammatory process.  No free air or free fluid in the abdomen.  Pelvis:  Uterus and ovaries are not enlarged.  Bladder wall is not thickened.  Suggestion of a small fibroid in the uterus measuring less than 1 cm diameter.  No free or loculated pelvic fluid collections.  No evidence of diverticulitis.  The appendix is normal.  Degenerative changes in the lumbar spine.  No destructive bone lesions appreciated.  IMPRESSION: Diffuse colonic edema suggesting nonspecific colitis.   Original Report Authenticated By: Burman Nieves, M.D.    Medications: I have reviewed the patient's current medications.  Assessment/Plan: 1. Abdominal pain/ diarrhea. Diffuse colonic thickening on CT more consistent with infectious etiology than ischemic etiology. G.I. pathogen panel still pending the patient clearly better on Cipro and Flagyl   Vernee Baines JR,Smantha Boakye L 05/28/2013, 9:19 AM

## 2013-05-28 NOTE — Progress Notes (Signed)
  Date: 05/28/2013  Patient name: [name] [name]  Medical record number: [id number]  Date of birth: 12-Apr-1951   This patient has been seen and the plan of care was discussed with the house staff. Please see their note for complete details. I concur with their findings with the following additions/corrections:  Abdominal pain is improved today, though she has been requiring pain medication IV overnight. Abdomen is soft, mildly tender diffusely but nondistended. She has + BS and no guarding. Improving on Cipro/Flagyl. Hold off on advancing diet today. IV changed to include dextrose.  Denies CP, SOB, palpitations, dizziness. No fever.   Jonah Blue, DO 05/28/2013, 1:53 PM

## 2013-05-28 NOTE — Progress Notes (Signed)
   CARE MANAGEMENT NOTE 05/28/2013  Patient:  [name] [name], [name] [name]   Account Number:  [id number]  Date Initiated:  05/28/2013  Documentation initiated by:  Letha Cape  Subjective/Objective Assessment:   dx gastroenteritis and colitis  admit- lives with daughter.     Action/Plan:   Anticipated DC Date:  05/30/2013   Anticipated DC Plan:  HOME/SELF CARE      DC Planning Services  CM consult      Choice offered to / List presented to:             Status of service:  In process, will continue to follow Medicare Important Message given?   (If response is "NO", the following Medicare IM given date fields will be blank) Date Medicare IM given:   Date Additional Medicare IM given:    Discharge Disposition:    Per UR Regulation:  Reviewed for med. necessity/level of care/duration of stay  If discussed at Long Length of Stay Meetings, dates discussed:    Comments:  05/28/13  Letha Cape RN, BSN [phone] patient lives with daughter.  NCM will continue to follow for dc needs.

## 2013-05-28 NOTE — Progress Notes (Signed)
Subjective: Patient states that her abdominal cramping has significantly improved overnight. She did receive 4mg  of dilaudid since midnight as of this morning during her interview. She does admit to some "cramping" and right sided pain overnight which was followed by a 'bloody' BM according to the patient. This has been the pattern, with the patient noting that she has had 3-4 bloody BMs/day with painful cramping preceding all of her bloody BMs.   Again, the patient remains afebrile without leukocytosis.   Objective: Vital signs in last 24 hours: Filed Vitals:   05/28/13 0459 05/28/13 0520 05/28/13 1053 05/28/13 1408  BP:  160/89 174/78 158/89  Pulse:  66 85 78  Temp:  98.4 F (36.9 C)  98.4 F (36.9 C)  TempSrc:  Oral  Oral  Resp:  18  20  Height:      Weight: 64.9 kg (143 lb 1.3 oz)     SpO2:  98%  100%   Weight change: 1 kg (2 lb 3.3 oz)  Intake/Output Summary (Last 24 hours) at 05/28/13 1414 Last data filed at 05/28/13 1300  Gross per 24 hour  Intake 984.17 ml  Output      7 ml  Net 977.17 ml   General: Comfortable appearing, resting in bed. Cardiac: RRR, no r/m/g Pulm: No increased WOB. No accessory muscle use. Mild wheezing diffusely bilaterally. Abdomen: NABS. Soft, nondistended, tenderness of RUQ and RLQ which is reduced with distraction.  Ext: Warm and well perfused. No edema, no cyanosis, no clubbing. No rashes noted to UEs, LEs, or abdomen. Neuro: No gross neurological deficits, alert and oriented   Lab Results: Basic Metabolic Panel:  Recent Labs Lab 05/26/13 1310 05/27/13 0107 05/27/13 0444 05/28/13 0625  NA 139  --  141 141  K 3.6  --  3.3* 3.8  CL 102  --  104 108  CO2 27  --  27 22  GLUCOSE 125*  --  133* 100*  BUN 12  --  13 15  CREATININE 0.65  --  0.50 0.57  CALCIUM 9.1  --  9.4 9.3  MG  --  1.7  --  2.0  PHOS  --  3.3  --   --    Liver Function Tests:  Recent Labs Lab 05/26/13 1310 05/27/13 0444  AST 18 26  ALT 15 18  ALKPHOS 65  66  BILITOT 0.5 1.2  PROT 6.5 7.1  ALBUMIN 3.5 3.7   CBC:  Recent Labs Lab 05/26/13 2002 05/27/13 0444 05/28/13 0625  WBC 7.7 11.5* 8.5  NEUTROABS 4.8  --   --   HGB 13.6 14.0 12.8  HCT 41.3 42.1 40.1  MCV 85.2 83.4 85.3  PLT 274 261 210   CBG:  Recent Labs Lab 05/27/13 0701 05/27/13 1151 05/27/13 1712 05/28/13 0004 05/28/13 0501 05/28/13 1122  GLUCAP 124* 111* 151* 97 89 112*    Micro Results: Recent Results (from the past 240 hour(s))  CLOSTRIDIUM DIFFICILE BY PCR     Status: None   Collection Time    05/26/13  2:31 PM      Result Value Range Status   C difficile by pcr NEGATIVE  NEGATIVE Final  CULTURE, BLOOD (ROUTINE X 2)     Status: None   Collection Time    05/27/13  1:05 AM      Result Value Range Status   Specimen Description BLOOD RIGHT ARM   Final   Special Requests BOTTLES DRAWN AEROBIC ONLY 5CC   Final  Culture  Setup Time 05/27/2013 05:56   Final   Culture     Final   Value:        BLOOD CULTURE RECEIVED NO GROWTH TO DATE CULTURE WILL BE HELD FOR 5 DAYS BEFORE ISSUING A FINAL NEGATIVE REPORT   Report Status PENDING   Incomplete  CULTURE, BLOOD (ROUTINE X 2)     Status: None   Collection Time    05/27/13  1:10 AM      Result Value Range Status   Specimen Description BLOOD RIGHT HAND   Final   Special Requests BOTTLES DRAWN AEROBIC ONLY 5CC   Final   Culture  Setup Time 05/27/2013 05:56   Final   Culture     Final   Value:        BLOOD CULTURE RECEIVED NO GROWTH TO DATE CULTURE WILL BE HELD FOR 5 DAYS BEFORE ISSUING A FINAL NEGATIVE REPORT   Report Status PENDING   Incomplete   Studies/Results: Ct Abdomen Pelvis W Contrast  05/27/2013   *RADIOLOGY REPORT*  Clinical Data:  Diffuse abdominal pain for 4 days.  Multiple episodes of diarrhea.  CT ABDOMEN AND PELVIS WITH CONTRAST  Technique:  Multidetector CT imaging of the abdomen and pelvis was performed following the standard protocol during bolus administration of intravenous contrast.  Contrast:  OMNIPAQUE IOHEXOL 300 MG/ML  SOLN  Comparison: 03/08/2013  Findings: Motion artifact in the lung bases.  Infiltration is not excluded.  Small esophageal hiatal hernia with distal esophageal wall thickening suggesting reflux esophagitis.  The liver, spleen, gallbladder, pancreas, adrenal glands, kidneys, abdominal aorta, inferior vena cava, and retroperitoneal lymph nodes are unremarkable.  The stomach and small bowel are not distended.  There is diffuse edema in the wall of the colon suggesting colitis.  This could be due to pseudomembranous colitis, infectious or inflammatory process.  No free air or free fluid in the abdomen.  Pelvis:  Uterus and ovaries are not enlarged.  Bladder wall is not thickened.  Suggestion of a small fibroid in the uterus measuring less than 1 cm diameter.  No free or loculated pelvic fluid collections.  No evidence of diverticulitis.  The appendix is normal.  Degenerative changes in the lumbar spine.  No destructive bone lesions appreciated.  IMPRESSION: Diffuse colonic edema suggesting nonspecific colitis.   Original Report Authenticated By: Burman Nieves, M.D.   Medications: I have reviewed the patient's current medications. Scheduled Meds: . ALPRAZolam  0.5 mg Oral BID  . aspirin EC  81 mg Oral Daily  . ciprofloxacin  400 mg Intravenous Q12H  . insulin aspart  0-9 Units Subcutaneous Q6H  . irbesartan  300 mg Oral Daily  . metoprolol tartrate  25 mg Oral BID  . metronidazole  500 mg Intravenous Q8H  . sodium chloride  3 mL Intravenous Q12H   Continuous Infusions: . dextrose 5 % and 0.45% NaCl 1,000 mL with potassium chloride 20 mEq infusion 100 mL/hr at 05/28/13 1253   PRN Meds:.HYDROmorphone (DILAUDID) injection, Ipratropium-Albuterol, ondansetron (ZOFRAN) IV, traZODone Assessment/Plan: Principal Problem:   Other and unspecified noninfectious gastroenteritis and colitis(558.9) Active Problems:   Nausea vomiting and diarrhea   Abdominal pain   Hepatitis  C   Hepatitis B   Hypertension   Diabetes mellitus  #Colitis- Abdominal Pain and nausea, vomiting and bloody diarrhea. Clinically stable at this time. Prior history of cdiff. Differential includes ischemia or infectious colitis. Normal venous lactate x2 making ischemia much less likely. C.diff antigen negative, which makes cdiff  somewhat unlikely. GI pathogen panel obtained and pending. GI consulted and following, their assistance is greatly appreciated.   -NPO except meds -Dilaudid 2mg  Q3h PRN -IV metronidazole.  -IV ciprofloxacin  -IV D5 1/2 NS + 20 mEq K at 100 cc/hr -Telemetry.   # DM2-  Currently controlled with , HBA1C- 6.1, 03/08/2013. Last CBG 100. -Hold metformin during hospitalization  -CBGs q6h  -SSI sensitive   #HTN: Home regimen includes metoprolol & benicar. BPs have been elevated with SBPs to 160s-180s range. Not an acute hospital concern, however antihypertensive regimen will need to be adjusted in the long term for more adequate control. -Irbesatan 300mg   -Metoprolol 25mg  BID  #Bipolar: Per patient, 10 year history of bipolar disorder treated with gabapentin. Patient also reports xanax for anxiety. Confirmed this medication regimen with the patient's pharmacy. -alprazolam 0.5mg  BID -holding gabapentin for now.  This is a Psychologist, occupational Note.  The care of the patient was discussed with Dr. Everardo Beals and the assessment and plan formulated with their assistance.  Please see their attached note for official documentation of the daily encounter.   LOS: 2 days   Salvatore Marvel, Med Student 05/28/2013, 2:14 PM

## 2013-05-28 NOTE — Progress Notes (Signed)
I have seen the patient and reviewed the daily progress note by [name] and discussed the care of the patient with them.  See below for documentation of my findings, assessment, and plans.  Subjective: Continues to be in pain, but reports abdominal pain is better controlled (though 10/10 when I saw her, she is soon due for pain medication).  She reports stools have decreased, having one small ("squirt") bloody BM this morning, but unable to count the number of BMs yesterday (3 documented).  RN concurs that stools have slowed down and pain better controlled.  No vomiting this morning, but did vomit last night.  Has received about 11mg  of dilaudid over 24h.  Objective: Vital signs in last 24 hours: Filed Vitals:   05/27/13 2154 05/28/13 0429 05/28/13 0459 05/28/13 0520  BP:    160/89  Pulse:    66  Temp:    98.4 F (36.9 C)  TempSrc:    Oral  Resp:    18  Height:      Weight:  142 lb 13.7 oz (64.8 kg) 143 lb 1.3 oz (64.9 kg)   SpO2: 97%   98%   Weight change: 2 lb 3.3 oz (1 kg)  Intake/Output Summary (Last 24 hours) at 05/28/13 0754 Last data filed at 05/28/13 0431  Gross per 24 hour  Intake 984.17 ml  Output      6 ml  Net 978.17 ml   General: resting in bed, appears uncomfortable, rolling side to side HEENT: PERRL, EOMI Cardiac: RRR, no rubs, murmurs or gallops Pulm: clear to auscultation bilaterally, moving normal volumes of air Abd: soft, diffusely tender, nondistended, BS hyperactive Ext: warm and well perfused, no pedal edema Neuro: alert and oriented X3, cranial nerves II-XII grossly intact  Lab Results: Reviewed and documented in Electronic Record Micro Results: Reviewed and documented in Electronic Record Studies/Results: Reviewed and documented in Electronic Record  Medications: I have reviewed the patient's current medications. Scheduled Meds: . aspirin EC  81 mg Oral Daily  . ciprofloxacin  400 mg Intravenous Q12H  . insulin aspart  0-9 Units  Subcutaneous Q6H  . irbesartan  300 mg Oral Daily  . metoprolol tartrate  25 mg Oral BID  . metronidazole  500 mg Intravenous Q8H  . sodium chloride  3 mL Intravenous Q12H   Continuous Infusions: . sodium chloride 0.9 % 1,000 mL with potassium chloride 20 mEq infusion 100 mL/hr at 05/27/13 2334   PRN Meds:.hydrALAZINE, HYDROmorphone (DILAUDID) injection, Ipratropium-Albuterol, ondansetron (ZOFRAN) IV Assessment/Plan: Ms. Rebekkah Powless is a 62 yo F admitted on 7/7 with complaints of N/V/D and abdominal Pain.  #Hemorrhagic colitis: Improved. Review: CT scan with diffuse colonic edema (nonspecific colitis); C.diff neg; lactic acid neg x 2; AG 11 this AM -Cont Cipro/Flagyl (started 7/8) -Await stool pathogen panel results -Appreciate GI input -Continue pain mgmt with dilaudid prn (improving pain and diarrhea) -Continue IVF Hydration (NS+K @ 100cc/h), NPO for now (may consider starting clears later today or tomorrow)  #HTN: As high as 195/112 yesterday -Continue home BB & ARB -Continue pain mgmt -Hydralazine prn SBP>200  #DM: A1c 6.1, 03/08/13 -holding home metformin -SSI  #VTE ppx: SCDs  --> Remainder per MSIV note  Dispo: Disposition is deferred at this time, awaiting improvement of current medical problems.  Anticipated discharge in approximately 2-3 day(s).   The patient does have a current PCP [name], MD) and does not need an [facility] hospital follow-up appointment after discharge.  The patient  does not have transportation limitations that hinder transportation to clinic appointments.  .Services Needed at time of discharge: Y = Yes, Blank = No PT:   OT:   RN:   Equipment:   Other:     LOS: 2 days   [name], MD 05/28/2013, 7:54 AM

## 2013-05-29 DIAGNOSIS — R109 Unspecified abdominal pain: Secondary | ICD-10-CM

## 2013-05-29 DIAGNOSIS — A0811 Acute gastroenteropathy due to Norwalk agent: Principal | ICD-10-CM

## 2013-05-29 DIAGNOSIS — E119 Type 2 diabetes mellitus without complications: Secondary | ICD-10-CM

## 2013-05-29 LAB — GLUCOSE, CAPILLARY: Glucose-Capillary: 113 mg/dL — ABNORMAL HIGH (ref 70–99)

## 2013-05-29 MED ORDER — HYDROCODONE-ACETAMINOPHEN 5-325 MG PO TABS
2.0000 | ORAL_TABLET | Freq: Four times a day (QID) | ORAL | Status: DC | PRN
  Administered 2013-05-29: 2 via ORAL
  Filled 2013-05-29: qty 2

## 2013-05-29 MED ORDER — ENOXAPARIN SODIUM 40 MG/0.4ML ~~LOC~~ SOLN
40.0000 mg | SUBCUTANEOUS | Status: DC
  Administered 2013-05-29 – 2013-05-30 (×2): 40 mg via SUBCUTANEOUS
  Filled 2013-05-29 (×3): qty 0.4

## 2013-05-29 MED ORDER — HYDROCODONE-ACETAMINOPHEN 10-325 MG PO TABS
1.0000 | ORAL_TABLET | Freq: Once | ORAL | Status: AC
  Administered 2013-05-29: 1 via ORAL
  Filled 2013-05-29: qty 1

## 2013-05-29 MED ORDER — HYDROCODONE-ACETAMINOPHEN 10-325 MG PO TABS
1.0000 | ORAL_TABLET | Freq: Four times a day (QID) | ORAL | Status: DC | PRN
  Administered 2013-05-30 – 2013-05-31 (×6): 2 via ORAL
  Filled 2013-05-29 (×6): qty 2

## 2013-05-29 NOTE — Progress Notes (Signed)
  Date: 05/29/2013  Patient name: [name] [name]  Medical record number: [id number]  Date of birth: 06-14-1951   This patient has been seen and the plan of care was discussed with the house staff. Please see their note for complete details. I concur with their findings with the following additions/corrections:  Diarrhea has improved. No vomiting. Still complains of intermittent cramping abdominal pain.  Stool is positive for norovirus.  Filed Vitals:   05/29/13 0956  BP: 156/66  Pulse: 84  Temp:   Resp:    GEN: AAOx3, NAD CV: S1S2, no m/r/g, RRR PULM: CTA bilat. ABD/GI: Soft, NT, +BS, minimal diffuse tenderness.  A/P 62 yr. Old AAF w/ enteritis initially concerning for hemorrhagic colitis, with norovirus enteritis. -Patient w/ hx of bloody stools, not typically seen with noroviris. Regardless, pathogens negative for all others. She has improved. Start clear liquids. D/C Flagyl and Cipro. D/C expected tomorrow if continues to improve.  Jonah Blue, DO 05/29/2013, 1:56 PM

## 2013-05-29 NOTE — Progress Notes (Signed)
See my separate progress note on 05/29/13.

## 2013-05-29 NOTE — Progress Notes (Signed)
[facility] PROGRESS NOTE Subjective patient still complaining of pain and wants more pain medication. Her diarrhea has improved. Gi pathogen panel positive for norvirus.  Objective: Vital signs in last 24 hours: Temp:  [98.3 F (36.8 C)-99.1 F (37.3 C)] 98.7 F (37.1 C) (07/10 0500) Pulse Rate:  [62-85] 78 (07/10 0500) Resp:  [18-20] 18 (07/10 0500) BP: (148-187)/(78-91) 149/81 mmHg (07/10 0500) SpO2:  [95 %-100 %] 97 % (07/10 0500) Weight:  [64.6 kg (142 lb 6.7 oz)] 64.6 kg (142 lb 6.7 oz) (07/10 0500) Last BM Date: 05/28/13  Intake/Output from previous day: 07/09 0701 - 07/10 0700 In: 3930 [I.V.:2930; IV Piggyback:1000] Out: 3 [Urine:3] Intake/Output this shift:      Lab Results:  Recent Labs  05/26/13 2002 05/27/13 0444 05/28/13 0625  WBC 7.7 11.5* 8.5  HGB 13.6 14.0 12.8  HCT 41.3 42.1 40.1  PLT 274 261 210   BMET  Recent Labs  05/26/13 1310 05/27/13 0444 05/28/13 0625  NA 139 141 141  K 3.6 3.3* 3.8  CL 102 104 108  CO2 27 27 22   CREATININE 0.65 0.50 0.57   LFT  Recent Labs  05/26/13 1310 05/27/13 0444  PROT 6.5 7.1  AST 18 26  ALT 15 18  ALKPHOS 65 66  BILITOT 0.5 1.2   PT/INR No results found for this basename: LABPROT, INR,  in the last 72 hours PANCREAS  Recent Labs  05/26/13 1729  LIPASE 15         Studies/Results: No results found.  Medications: I have reviewed the patient's current medications.  Assessment/Plan: 1. Diffuse colitis/diarrhea. G.I. pathogen panel positive for norovirus . This almost certainly explains the CT findings. Don't feel that she needs to be on Cipro Flagyl given that this is a viral infection. Her pain appears to be out of proportion to what would be expected. We'll probably add dicyclomine and try to avoid narcotics of possible  [name] [name] JR,[name] L 05/29/2013, 9:12 AM

## 2013-05-29 NOTE — Progress Notes (Signed)
Subjective: [name] [name] states that she is continuing to experience abdominal cramping, however she denies any bloody stools overnight or this morning. She specifically complains about not receiving adequate pain medication. According to the patient she does take chronic pain medication at home, taking 1-2 Lortab as needed. She notes that her abdominal pain is worsened without pain medication, and occasionally she experiencing sweating when she has not taken her Lortab.  Otherwise, patient continues to be afebrile, without leukocytosis.   Objective: Vital signs in last 24 hours: Filed Vitals:   05/28/13 2038 05/29/13 0252 05/29/13 0500 05/29/13 0956  BP: 187/91 148/78 149/81 156/66  Pulse: 72 62 78 84  Temp: 99.1 F (37.3 C) 98.3 F (36.8 C) 98.7 F (37.1 C)   TempSrc: Oral Oral Oral   Resp: 18 20 18    Height:      Weight:   64.6 kg (142 lb 6.7 oz)   SpO2: 97% 95% 97%    Weight change: -0.2 kg (-7.1 oz)  Intake/Output Summary (Last 24 hours) at 05/29/13 1437 Last data filed at 05/29/13 0612  Gross per 24 hour  Intake   3930 ml  Output      2 ml  Net   3928 ml   Exam: General: Uncomfortable appearing, moving around in bed, alert and oriented Cardiac: RRR, no r/m/g Pulm: No increased WOB. No accessory muscle use. . Abdomen: Soft, non-distended, NABS, mildly tender with decreased tenderness with distraction Ext: Warm and well perfused. No edema, no cyanosis, no clubbing. Neuro: No gross neurological deficits, alert and oriented   Lab Results: Basic Metabolic Panel:  Recent Labs Lab 05/26/13 1310 05/27/13 0107 05/27/13 0444 05/28/13 0625  NA 139  --  141 141  K 3.6  --  3.3* 3.8  CL 102  --  104 108  CO2 27  --  27 22  GLUCOSE 125*  --  133* 100*  BUN 12  --  13 15  CREATININE 0.65  --  0.50 0.57  CALCIUM 9.1  --  9.4 9.3  MG  --  1.7  --  2.0  PHOS  --  3.3  --   --    CBC:  Recent Labs Lab 05/26/13 2002 05/27/13 0444 05/28/13 0625  WBC 7.7 11.5* 8.5    NEUTROABS 4.8  --   --   HGB 13.6 14.0 12.8  HCT 41.3 42.1 40.1  MCV 85.2 83.4 85.3  PLT 274 261 210   CBG:  Recent Labs Lab 05/28/13 0501 05/28/13 1122 05/28/13 1650 05/28/13 2353 05/29/13 0610 05/29/13 1216  GLUCAP 89 112* 99 124* 113* 120*    Medications: I have reviewed the patient's current medications. Scheduled Meds: . ALPRAZolam  0.5 mg Oral BID  . aspirin EC  81 mg Oral Daily  . enoxaparin (LOVENOX) injection  40 mg Subcutaneous Q24H  . insulin aspart  0-9 Units Subcutaneous Q6H  . irbesartan  300 mg Oral Daily  . metoprolol tartrate  25 mg Oral BID  . sodium chloride  3 mL Intravenous Q12H   Continuous Infusions: . dextrose 5 % and 0.45% NaCl 1,000 mL with potassium chloride 20 mEq infusion 100 mL/hr at 05/29/13 0203   PRN Meds:.HYDROmorphone (DILAUDID) injection, Ipratropium-Albuterol, ondansetron (ZOFRAN) IV, traZODone, white petrolatum Assessment/Plan: Principal Problem:   Other and unspecified noninfectious gastroenteritis and colitis(558.9) Active Problems:   Nausea vomiting and diarrhea   Abdominal pain   Hepatitis C   Hepatitis B   Hypertension   Diabetes mellitus  #  Colitis- Abdominal Pain and nausea, vomiting and bloody diarrhea. GI pathogen panel positive for norovirus. Clinically stable at this time. Prior history of cdiff. GI consulted and following, their assistance is greatly appreciated. GI assessment that norovirus explains our clinical findings as well as the colonic inflammation identified on CT. Also concerned that pain is out of proportion to clinical findings, and GI suggests avoiding narcotics and consider dicyclomine. -clear liquid diet -d/c cipro/flagyl -d/c IV dilaudid -start home hydrocodone/acetaminophen 5mg /325mg  2 pills q6 PRN -will not start dicyclomine presently, but will consider as patient improves clinically -Continue IV D5 1/2 NS + 20 mEq K at 100 cc/hr until adequate intake -Telemetry.   # DM2- Currently controlled  with , HBA1C- 6.1, 03/08/2013.  -Hold metformin during hospitalization  -CBGs q6h  -SSI sensitive   #HTN: Home regimen includes metoprolol & benicar. BPs have been elevated with SBPs to 140s-150s range. Not an acute hospital concern, however antihypertensive regimen will need to be adjusted in the long term for more adequate control.  -Irbesatan 300mg   -Metoprolol 25mg  BID   #Bipolar: Per patient, 10 year history of bipolar disorder treated with gabapentin. Patient also reports xanax for anxiety. Confirmed this medication regimen with the patient's pharmacy.  -alprazolam 0.5mg  BID  -holding gabapentin for now.  This is a Psychologist, occupational Note.  The care of the patient was discussed with Dr. Kem Kays and the assessment and plan formulated with their assistance.  Please see their attached note for official documentation of the daily encounter.   LOS: 3 days   Salvatore Marvel, Med Student 05/29/2013, 2:37 PM

## 2013-05-29 NOTE — Progress Notes (Signed)
Chaplain Note:  Chaplain visited with pt who was resting in bed, awake, alert, and fully oriented.  Chaplain provided spiritual comfort, support, and prayer for pt.  Pt expressed appreciation for chaplain support. Chaplain will follow up as needed.  05/29/13 1400  Clinical Encounter Type  Visited With Patient  Visit Type Spiritual support  Referral From Nurse  Spiritual Encounters  Spiritual Needs Prayer;Emotional  Stress Factors  Patient Stress Factors Health changes;Lack of knowledge;Major life changes  Family Stress Factors Not reviewed (No family present)  Verdie Shire, Chaplain [phone]

## 2013-05-30 ENCOUNTER — Encounter (HOSPITAL_COMMUNITY): Payer: Self-pay | Admitting: Gastroenterology

## 2013-05-30 DIAGNOSIS — F319 Bipolar disorder, unspecified: Secondary | ICD-10-CM

## 2013-05-30 LAB — GLUCOSE, CAPILLARY
Glucose-Capillary: 145 mg/dL — ABNORMAL HIGH (ref 70–99)
Glucose-Capillary: 97 mg/dL (ref 70–99)

## 2013-05-30 LAB — URINALYSIS, ROUTINE W REFLEX MICROSCOPIC
Glucose, UA: 100 mg/dL — AB
Ketones, ur: NEGATIVE mg/dL
Leukocytes, UA: NEGATIVE
Nitrite: NEGATIVE
Protein, ur: NEGATIVE mg/dL

## 2013-05-30 LAB — COMPREHENSIVE METABOLIC PANEL
Alkaline Phosphatase: 61 U/L (ref 39–117)
BUN: 6 mg/dL (ref 6–23)
CO2: 22 mEq/L (ref 19–32)
GFR calc Af Amer: >90 mL/min (ref >90)
Sodium: 137 mEq/L (ref 135–145)
Total Bilirubin: 1.1 mg/dL (ref 0.3–1.2)

## 2013-05-30 LAB — CBC
MCH: 28.1 pg (ref 26.0–34.0)
MCHC: 33.7 g/dL (ref 30.0–36.0)
MCV: 83.2 fL (ref 78.0–100.0)
Platelets: 213 10*3/uL (ref 150–400)
RBC: 5.06 MIL/uL (ref 3.87–5.11)
RDW: 15.1 % (ref 11.5–15.5)

## 2013-05-30 MED ORDER — DICYCLOMINE HCL 20 MG PO TABS
20.0000 mg | ORAL_TABLET | Freq: Three times a day (TID) | ORAL | Status: DC
  Administered 2013-05-30 – 2013-05-31 (×5): 20 mg via ORAL
  Filled 2013-05-30 (×7): qty 1

## 2013-05-30 MED ORDER — LORATADINE 10 MG PO TABS
10.0000 mg | ORAL_TABLET | Freq: Every day | ORAL | Status: DC
  Administered 2013-05-30 – 2013-05-31 (×2): 10 mg via ORAL
  Filled 2013-05-30 (×2): qty 1

## 2013-05-30 NOTE — Progress Notes (Signed)
Subjective: Patient continues to complain of abdominal pain and requests pain medication. Minimal diarrhea with small loose stool this morning. Did not eat yesterday.   Objective: Vital signs in last 24 hours: Filed Vitals:   05/29/13 1300 05/29/13 2211 05/30/13 0620 05/30/13 0814  BP: 163/75 160/79 172/79 179/103  Pulse: 76 75 67 75  Temp: 98.6 F (37 C) 98.6 F (37 C) 98.4 F (36.9 C)   TempSrc: Oral Oral Oral   Resp: 18 20 20    Height:      Weight:   64.5 kg (142 lb 3.2 oz)   SpO2: 96% 97% 99%    Weight change: -0.1 kg (-3.5 oz)  Intake/Output Summary (Last 24 hours) at 05/30/13 1236 Last data filed at 05/30/13 0900  Gross per 24 hour  Intake 2461.67 ml  Output      0 ml  Net 2461.67 ml   Exam General: Uncomfortable appearing, moving around in bed Cardiac: RRR, no r/m/g Pulm: No increased WOB. No accessory muscle use.  Abdomen: NABS, nondistended. Soft, tender in RLQ particularly near inguinal ligament, somewhat improved with distraction, voluntary guarding. Ext: Warm and well perfused. No edema, no cyanosis, no clubbing. Neuro: No gross neurological deficits, alert and oriented   Lab Results: Basic Metabolic Panel:  Recent Labs Lab 05/26/13 1310 05/27/13 0107 05/27/13 0444 05/28/13 0625  NA 139  --  141 141  K 3.6  --  3.3* 3.8  CL 102  --  104 108  CO2 27  --  27 22  GLUCOSE 125*  --  133* 100*  BUN 12  --  13 15  CREATININE 0.65  --  0.50 0.57  CALCIUM 9.1  --  9.4 9.3  MG  --  1.7  --  2.0  PHOS  --  3.3  --   --    CBC:  Recent Labs Lab 05/26/13 2002  05/28/13 0625 05/30/13 1158  WBC 7.7  < > 8.5 4.4  NEUTROABS 4.8  --   --   --   HGB 13.6  < > 12.8 14.2  HCT 41.3  < > 40.1 42.1  MCV 85.2  < > 85.3 83.2  PLT 274  < > 210 213  < > = values in this interval not displayed. CBG:  Recent Labs Lab 05/28/13 2353 05/29/13 0610 05/29/13 1216 05/29/13 1755 05/30/13 0040 05/30/13 0619  GLUCAP 124* 113* 120* 109* 119* 145*     Medications: I have reviewed the patient's current medications. Scheduled Meds: . ALPRAZolam  0.5 mg Oral BID  . aspirin EC  81 mg Oral Daily  . dicyclomine  20 mg Oral TID AC  . enoxaparin (LOVENOX) injection  40 mg Subcutaneous Q24H  . insulin aspart  0-9 Units Subcutaneous Q6H  . irbesartan  300 mg Oral Daily  . metoprolol tartrate  25 mg Oral BID  . sodium chloride  3 mL Intravenous Q12H   Continuous Infusions: . dextrose 5 % and 0.45% NaCl 1,000 mL with potassium chloride 20 mEq infusion 100 mL/hr at 05/30/13 0040   PRN Meds:.HYDROcodone-acetaminophen, Ipratropium-Albuterol, ondansetron (ZOFRAN) IV, traZODone, white petrolatum Assessment/Plan: Principal Problem:   Other and unspecified noninfectious gastroenteritis and colitis(558.9) Active Problems:   Nausea vomiting and diarrhea   Abdominal pain   Hepatitis C   Hepatitis B   Hypertension   Diabetes mellitus  #Colitis- Abdominal Pain and nausea, vomiting and bloody diarrhea. Nrovirus positive, cdiff negative. Clinically stable at this time. Prior history of cdiff. GI consulted  and following, their assistance is greatly appreciated. GI assessment that norovirus explains our clinical findings as well as the colonic inflammation identified on CT. Also concerned that pain is out of proportion to clinical findings which is not unusual for this patient, and GI suggests avoiding narcotics and consider dicyclomine. Also in DDx for patient's RLQ pain, urinary retention, ileus, and renal lithiasis. -obtain flat and upright abdominal films to assess for ileus -postvoid residual to assess for urinary retention -urinanalysis to assess for stones, although no stones noted on CT. -continue clear liquid diet, encourage intake -continue home hydrocodone/acetaminophen 5mg /325mg  2 pills q6 PRN  -start dicyclomine 20mg  TID before meals -Continue IV D5 1/2 NS + 20 mEq K at 100 cc/hr until adequate intake  -Telemetry. -F/U with primary  gastroenterologist as outpatient after discharge to assess need for further workup -consider discharge if workup remains unconcerning and patient with adequate oral intake  # DM2- Currently controlled with , HBA1C- 6.1, 03/08/2013.  -Hold metformin during hospitalization  -CBGs q6h  -SSI sensitive   #HTN: Home regimen includes metoprolol & benicar. BPs have been elevated with SBPs to 140s-150s range. Not an acute hospital concern, however antihypertensive regimen will need to be adjusted in the long term for more adequate control.  -Irbesatan 300mg   -Metoprolol 25mg  BID   #Bipolar: Per patient, 10 year history of bipolar disorder treated with gabapentin. Patient also reports xanax for anxiety. Confirmed this medication regimen with the patient's pharmacy.  -alprazolam 0.5mg  BID  -holding gabapentin for now.  This is a Psychologist, occupational Note.  The care of the patient was discussed with Dr. Everardo Beals and the assessment and plan formulated with their assistance.  Please see their attached note for official documentation of the daily encounter.   LOS: 4 days   Salvatore Marvel, Med Student 05/30/2013, 12:36 PM

## 2013-05-30 NOTE — Progress Notes (Signed)
  Date: 05/30/2013  Patient name: [name] [name]  Medical record number: [id number]  Date of birth: 05-07-1951   This patient has been seen and the plan of care was discussed with the house staff. Please see their note for complete details. I concur with their findings with the following additions/corrections:  This morning complains of crampy abdominal pain. She had a BM that was mostly formed and non-bloody. Denies emesis. Denies dysuria or difficulty with urination. She agrees to slowly advance diet.  Filed Vitals:   05/30/13 0814  BP: 179/103  Pulse: 75  Temp:   Resp:    GEN: AAOx3, NAD CV: S1S2, no m/r/g,RRR PULM: CTA bilat ABD/GI: Soft, minimal tenderness over RLQ and suprapubic area, no guarding, no distention, decreased bowel sounds.  Plan: -Colitis is resolving. I am not convinced that norovirus caused bloody stools and isolated colitis, as small bowel is usually affected and not typical to have bloody diarrhea. She is afebrile, no leukocytosis. Reviewed past colonoscopy, no hx of IBD. Due to persistent pain, obtain Abd flat and upright to look for signs of ileus. Check UA to look for evidence of infection and obtain a PVR to assure no urine retention. Back off of narcotics, as this could be causing an ileus. Trial of dicyclomine. Advance diet as tolerated.   Jonah Blue, DO 05/30/2013, 12:57 PM

## 2013-05-30 NOTE — Care Management Note (Signed)
    Page 1 of 1   06/03/2013     4:23:45 PM   CARE MANAGEMENT NOTE 06/03/2013  Patient:  [name] [name], [name] [name]   Account Number:  [id number]  Date Initiated:  05/28/2013  Documentation initiated by:  Letha Cape  Subjective/Objective Assessment:   dx gastroenteritis and colitis  admit- lives with daughter.  pta indep.  Uses a cane.     Action/Plan:   Anticipated DC Date:  05/30/2013   Anticipated DC Plan:  HOME/SELF CARE      DC Planning Services  CM consult      Choice offered to / List presented to:             Status of service:  Completed, signed off Medicare Important Message given?   (If response is "NO", the following Medicare IM given date fields will be blank) Date Medicare IM given:   Date Additional Medicare IM given:    Discharge Disposition:  HOME/SELF CARE  Per UR Regulation:  Reviewed for med. necessity/level of care/duration of stay  If discussed at Long Length of Stay Meetings, dates discussed:    Comments:  05/30/13 10:17 Letha Cape RN, BSN [phone] patient states she justed started clears today, patient uses a cane at home but pretty indep.  Patient has medication coverage and transportation at dc.  NCM will continue to follow for dc needs.  Per MD will advance diet then dc home.  05/28/13  Letha Cape RN, BSN [phone] patient lives with daughter.  NCM will continue to follow for dc needs.

## 2013-05-30 NOTE — Progress Notes (Signed)
[facility] PROGRESS NOTE Subjective patient states that diarrhea is gone but still having abdominal pain. Started on clear liquid  Objective: Vital signs in last 24 hours: Temp:  [98.4 F (36.9 C)-98.6 F (37 C)] 98.4 F (36.9 C) (07/11 0620) Pulse Rate:  [67-84] 67 (07/11 0620) Resp:  [18-20] 20 (07/11 0620) BP: (156-172)/(66-79) 172/79 mmHg (07/11 0620) SpO2:  [96 %-99 %] 99 % (07/11 0620) Weight:  [64.5 kg (142 lb 3.2 oz)] 64.5 kg (142 lb 3.2 oz) (07/11 0620) Last BM Date: 05/29/13  Intake/Output from previous day: 07/10 0701 - 07/11 0700 In: 2401.7 [I.V.:2401.7] Out: -  Intake/Output this shift:    PE: General-- patient states that she is in severe pain  Abdomen-- none distended and soft and reasonably nontender  Lab Results:  Recent Labs  05/28/13 0625  WBC 8.5  HGB 12.8  HCT 40.1  PLT 210   BMET  Recent Labs  05/28/13 0625  NA 141  K 3.8  CL 108  CO2 22  CREATININE 0.57   LFT No results found for this basename: PROT, AST, ALT, ALKPHOS, BILITOT, BILIDIR, IBILI,  in the last 72 hours PT/INR No results found for this basename: LABPROT, INR,  in the last 72 hours PANCREAS No results found for this basename: LIPASE,  in the last 72 hours       Studies/Results: No results found.  Medications: I have reviewed the patient's current medications.  Assessment/Plan: 1. Abdominal pain/diarrhea. Stools are positive for norovirus which could clearly explain the colonic edema CT scan and her diarrhea. This appears to be improving. She had some blood in the stool that appears to have cleared. She has received previous colonoscopy is with colon polyp removal and is on a schedule for follow-up. C. diff was negative. Her last colonoscopy 3/14 negative other than polyp. 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  Plan: we try to give her antispasmodic's and taper from narcotics and hopefully be able to discharge a low residue diet. We can  see her back in the office and several weeks.  Topacio Cella JR,Araiyah Cumpton L 05/30/2013, 7:59 AM

## 2013-05-30 NOTE — Progress Notes (Signed)
Post void bladder scan done order 50 ml.

## 2013-05-31 ENCOUNTER — Inpatient Hospital Stay (HOSPITAL_COMMUNITY): Payer: Medicaid Other

## 2013-05-31 LAB — GLUCOSE, CAPILLARY
Glucose-Capillary: 108 mg/dL — ABNORMAL HIGH (ref 70–99)
Glucose-Capillary: 115 mg/dL — ABNORMAL HIGH (ref 70–99)

## 2013-05-31 MED ORDER — POTASSIUM CHLORIDE CRYS ER 20 MEQ PO TBCR
40.0000 meq | EXTENDED_RELEASE_TABLET | ORAL | Status: DC
  Administered 2013-05-31: 40 meq via ORAL

## 2013-05-31 MED ORDER — HYDROCODONE-ACETAMINOPHEN 5-325 MG PO TABS: 1.0000 | ORAL_TABLET | Freq: Four times a day (QID) | ORAL | Status: DC | PRN

## 2013-05-31 MED ORDER — DICYCLOMINE HCL 20 MG PO TABS: 20.0000 mg | ORAL_TABLET | Freq: Three times a day (TID) | ORAL | Status: DC

## 2013-05-31 NOTE — Progress Notes (Signed)
Clinical Social Work  Charity fundraiser reports that patient needs bus pass to get home. CSW provided patient with bus pass and she reports she does not need directions and knows how to ride the bus. CSW is signing off but available if needed.  Unk Lightning, LCSW (Weekend Coverage)

## 2013-05-31 NOTE — Progress Notes (Signed)
Patient  Discharge instruction review and patient and daughter verbalized understanding. No concerns verbalized Iv removed

## 2013-05-31 NOTE — Progress Notes (Signed)
Subjective: Still complaining of abdominal pain in RLQ as well as thigh and lower back. Unchanged. Patient's diarrhea appears to be improving, although at least one nonformed stool overnight. Patient stated that she did eat some yesterday and would like to try some regular food.  Objective: Vital signs in last 24 hours: Filed Vitals:   05/30/13 1700 05/30/13 2205 19-Jun-2013 0553 06/19/13 1053  BP: 147/73 155/83 138/86 145/73  Pulse:  73 92 73  Temp:  98.4 F (36.9 C) 98.6 F (37 C)   TempSrc:  Oral Oral   Resp:  18 16   Height:      Weight:   64.9 kg (143 lb 1.3 oz)   SpO2:  100% 98%    Weight change: 0.4 kg (14.1 oz)  Intake/Output Summary (Last 24 hours) at 06/19/2013 1147 Last data filed at 06-19-2013 1100  Gross per 24 hour  Intake 2398.33 ml  Output   1201 ml  Net 1197.33 ml   Exam: General: Comfortable appearing, resting upright in bed, somewhat irritable during interview Cardiac: RRR, no r/m/g Pulm: No increased WOB. No accessory muscle use. CTAB Abdomen: NABS, soft, nondistended, somewhat tender in RLQ. No palpable masses or hepatosplenomegaly Ext: Warm and well perfused. No edema, no cyanosis, no clubbing. Neuro: No gross neurological deficits, alert and oriented   Lab Results: Basic Metabolic Panel:  Recent Labs Lab 05/26/13 1310 05/27/13 0107  05/28/13 0625 05/30/13 1158  NA 139  --   < > 141 137  K 3.6  --   < > 3.8 3.2*  CL 102  --   < > 108 102  CO2 27  --   < > 22 22  GLUCOSE 125*  --   < > 100* 118*  BUN 12  --   < > 15 6  CREATININE 0.65  --   < > 0.57 0.41*  CALCIUM 9.1  --   < > 9.3 9.5  MG  --  1.7  --  2.0  --   PHOS  --  3.3  --   --   --   < > = values in this interval not displayed. CBG:  Recent Labs Lab 05/30/13 0619 05/30/13 1230 05/30/13 1732 06/19/13 0002 19-Jun-2013 0551 19-Jun-2013 0758  GLUCAP 145* 113* 97 108* 123* 115*    Studies/Results: Dg Abd 2 Views  June 19, 2013   *RADIOLOGY REPORT*  Clinical Data: Abdominal pain.   Nausea, vomiting and diarrhea.  ABDOMEN - 2 VIEW  Comparison: Chest and two views abdomen 05/21/2013 and CT abdomen and pelvis 05/27/2013.  Findings: No free intraperitoneal air is identified.  The bowel gas pattern is normal.  Changes of colitis seen on the patient's CT scan are not appreciated on plain film.  IMPRESSION: Negative exam.   Original Report Authenticated By: Holley Dexter, M.D.   Medications: I have reviewed the patient's current medications. Scheduled Meds: . ALPRAZolam  0.5 mg Oral BID  . aspirin EC  81 mg Oral Daily  . dicyclomine  20 mg Oral TID AC  . enoxaparin (LOVENOX) injection  40 mg Subcutaneous Q24H  . insulin aspart  0-9 Units Subcutaneous Q6H  . irbesartan  300 mg Oral Daily  . loratadine  10 mg Oral Daily  . metoprolol tartrate  25 mg Oral BID  . sodium chloride  3 mL Intravenous Q12H   Continuous Infusions: . dextrose 5 % and 0.45% NaCl 1,000 mL with potassium chloride 20 mEq infusion 100 mL/hr at 2013/06/19 0331  PRN Meds:.HYDROcodone-acetaminophen, Ipratropium-Albuterol, ondansetron (ZOFRAN) IV, traZODone, white petrolatum Assessment/Plan: Principal Problem:   Other and unspecified noninfectious gastroenteritis and colitis(558.9) Active Problems:   Nausea vomiting and diarrhea   Abdominal pain   Hepatitis C   Hepatitis B   Hypertension   Diabetes mellitus  #Colitis- N/V/D, BRBPR, with inflammatory changes observed on CT. Norovirus positive. Somewhat unusual for norovirus, however symptoms appear to be resolving. Obtained abdominal plain films, postvoid residual, and urinanalysis obtained to r/o ileus, retention, and UTI/pyelo. Of note, colonoscopy in 01/2013 was unremarkable. -plan films unremarkable, colonic changes noted on CT not observed in these films -postvoid residual 50cc, no indication of retention -U/A unremarkable except for some glucosuria. -advance diet as tolerated -Potassium PO 40 mEq PO BID -d/c IV fluids, restart if does not  tolerate intake -PRN hydrocodone 10mg /acetaminophen 325 mg x2 tablets PRN, which patient states is home dosage -continue dicyclomine TID with meals  -Telemetry.  -F/U with primary gastroenterologist as outpatient after discharge to assess need for further workup  -consider discharge if workup remains unconcerning and patient with adequate oral intake   # DM2- Currently controlled with , HBA1C- 6.1, 03/08/2013.  -Hold metformin during hospitalization  -CBGs q6h  -SSI sensitive   #HTN: Home regimen includes metoprolol & benicar. BPs have been elevated with SBPs to 140s-150s range. Not an acute hospital concern, however antihypertensive regimen will need to be adjusted in the long term for more adequate control.  -Irbesatan 300mg   -Metoprolol 25mg  BID   #Bipolar: Per patient, 10 year history of bipolar disorder treated with gabapentin. Patient also reports xanax for anxiety. Confirmed this medication regimen with the patient's pharmacy.  -alprazolam 0.5mg  BID  -holding gabapentin for now.  Dispo: If patient tolerates diet, may consider discharge this afternoon or tomorrow morning. Patient has f/u with Gastroenterologist, Dr. [name] on July 17th according to the patient.   This is a Psychologist, occupational Note.  The care of the patient was discussed with Dr. Everardo Beals and the assessment and plan formulated with their assistance.  Please see their attached note for official documentation of the daily encounter.   LOS: 5 days   Salvatore Marvel, Med Student 05/31/2013, 11:47 AM

## 2013-05-31 NOTE — Progress Notes (Signed)
[facility] Progress Note  Subjective: Still co mainly RLQ abdominal pain radiating to low back and down R leg, diarrhea is somewhat improved. Semiformed stool.   Objective: Vital signs in last 24 hours: Temp:  [98.4 F (36.9 C)-98.6 F (37 C)] 98.6 F (37 C) (07/12 0553) Pulse Rate:  [73-92] 92 (07/12 0553) Resp:  [16-18] 16 (07/12 0553) BP: (138-155)/(73-86) 138/86 mmHg (07/12 0553) SpO2:  [98 %-100 %] 98 % (07/12 0553) Weight:  [64.9 kg (143 lb 1.3 oz)] 64.9 kg (143 lb 1.3 oz) (07/12 0553) Weight change: 0.4 kg (14.1 oz)   ZO:XWRU RLQ tenderness  Lab Results: Results for orders placed during the hospital encounter of 05/26/13 (from the past 24 hour(s))  CBC     Status: None   Collection Time    05/30/13 11:58 AM      Result Value Range   WBC 4.4  4.0 - 10.5 K/uL   RBC 5.06  3.87 - 5.11 MIL/uL   Hemoglobin 14.2  12.0 - 15.0 g/dL   HCT 04.5  40.9 - 81.1 %   MCV 83.2  78.0 - 100.0 fL   MCH 28.1  26.0 - 34.0 pg   MCHC 33.7  30.0 - 36.0 g/dL   RDW 91.4  78.2 - 95.6 %   Platelets 213  150 - 400 K/uL  COMPREHENSIVE METABOLIC PANEL     Status: Abnormal   Collection Time    05/30/13 11:58 AM      Result Value Range   Sodium 137  135 - 145 mEq/L   Potassium 3.2 (*) 3.5 - 5.1 mEq/L   Chloride 102  96 - 112 mEq/L   CO2 22  19 - 32 mEq/L   Glucose, Bld 118 (*) 70 - 99 mg/dL   BUN 6  6 - 23 mg/dL   Creatinine, Ser 2.13 (*) 0.50 - 1.10 mg/dL   Calcium 9.5  8.4 - 08.6 mg/dL   Total Protein 6.8  6.0 - 8.3 g/dL   Albumin 3.5  3.5 - 5.2 g/dL   AST 21  0 - 37 U/L   ALT 20  0 - 35 U/L   Alkaline Phosphatase 61  39 - 117 U/L   Total Bilirubin 1.1  0.3 - 1.2 mg/dL   GFR calc non Af Amer >90  >90 mL/min   GFR calc Af Amer >90  >90 mL/min  GLUCOSE, CAPILLARY     Status: Abnormal   Collection Time    05/30/13 12:30 PM      Result Value Range   Glucose-Capillary 113 (*) 70 - 99 mg/dL   Comment 1 Documented in Chart     Comment 2 Notify RN    URINALYSIS, ROUTINE W  REFLEX MICROSCOPIC     Status: Abnormal   Collection Time    05/30/13  2:53 PM      Result Value Range   Color, Urine AMBER (*) YELLOW   APPearance CLOUDY (*) CLEAR   Specific Gravity, Urine 1.019  1.005 - 1.030   pH 7.0  5.0 - 8.0   Glucose, UA 100 (*) NEGATIVE mg/dL   Hgb urine dipstick NEGATIVE  NEGATIVE   Bilirubin Urine NEGATIVE  NEGATIVE   Ketones, ur NEGATIVE  NEGATIVE mg/dL   Protein, ur NEGATIVE  NEGATIVE mg/dL   Urobilinogen, UA 0.2  0.0 - 1.0 mg/dL   Nitrite NEGATIVE  NEGATIVE   Leukocytes, UA NEGATIVE  NEGATIVE  GLUCOSE, CAPILLARY     Status: None   Collection  Time    05/30/13  5:32 PM      Result Value Range   Glucose-Capillary 97  70 - 99 mg/dL   Comment 1 Documented in Chart     Comment 2 Notify RN    GLUCOSE, CAPILLARY     Status: Abnormal   Collection Time    June 25, 2013 12:02 AM      Result Value Range   Glucose-Capillary 108 (*) 70 - 99 mg/dL   Comment 1 Documented in Chart     Comment 2 Notify RN    GLUCOSE, CAPILLARY     Status: Abnormal   Collection Time    25-Jun-2013  5:51 AM      Result Value Range   Glucose-Capillary 123 (*) 70 - 99 mg/dL  GLUCOSE, CAPILLARY     Status: Abnormal   Collection Time    June 25, 2013  7:58 AM      Result Value Range   Glucose-Capillary 115 (*) 70 - 99 mg/dL    Studies/Results: Dg Abd 2 Views  06/25/2013   *RADIOLOGY REPORT*  Clinical Data: Abdominal pain.  Nausea, vomiting and diarrhea.  ABDOMEN - 2 VIEW  Comparison: Chest and two views abdomen 05/21/2013 and CT abdomen and pelvis 05/27/2013.  Findings: No free intraperitoneal air is identified.  The bowel gas pattern is normal.  Changes of colitis seen on the patient's CT scan are not appreciated on plain film.  IMPRESSION: Negative exam.   Original Report Authenticated By: Holley Dexter, M.D.      Assessment:  RLQ abdominal pain and diarrhea, serologic test pos for Norovirus. Mild colonic edema on CT, C diff neg   Plan: Cont supportive care and expectant  management. Advance diet as tolerated.    Brownie Nehme C 2013/06/25, 10:43 AM

## 2013-05-31 NOTE — Discharge Summary (Signed)
Name: [name] [name] MRN: [id number] DOB: [birth year] 62 y.o. PCP: [name], MD  Date of Admission: 05/26/2013 12:33 PM Date of Discharge: 05/31/2013 Attending Physician: Jonah Blue, DO  Discharge Diagnosis: Principal Problem:   Other and unspecified noninfectious gastroenteritis and colitis(558.9) Active Problems:   Nausea vomiting and diarrhea   Abdominal pain   Hepatitis C   Hepatitis B   Hypertension   Diabetes mellitus  Discharge Medications:   Medication List    STOP taking these medications       oxyCODONE-acetaminophen 5-325 MG per tablet  Commonly known as:  PERCOCET/ROXICET     vancomycin 250 MG capsule  Commonly known as:  VANCOCIN HCL      TAKE these medications       acetaminophen 500 MG tablet  Commonly known as:  TYLENOL  Take 1 tablet (500 mg total) by mouth every 6 (six) hours as needed for pain.     ALPRAZolam 0.5 MG tablet  Commonly known as:  XANAX  Take 0.5 mg by mouth 2 (two) times daily.     aspirin EC 81 MG tablet  Take 81 mg by mouth daily.     COMBIVENT RESPIMAT 20-100 MCG/ACT Aers respimat  Generic drug:  Ipratropium-Albuterol  Inhale 1 puff into the lungs every 6 (six) hours.     dicyclomine 20 MG tablet  Commonly known as:  BENTYL  Take 1 tablet (20 mg total) by mouth 3 (three) times daily with meals.     ergocalciferol 50000 UNITS capsule  Commonly known as:  VITAMIN D2  Take 50,000 Units by mouth every Monday.     esomeprazole 20 MG capsule  Commonly known as:  NEXIUM  Take 20 mg by mouth daily before breakfast.     gabapentin 400 MG capsule  Commonly known as:  NEURONTIN  Take 400 mg by mouth 3 (three) times daily.     HYDROcodone-acetaminophen 5-325 MG per tablet  Commonly known as:  NORCO/VICODIN  Take 1-2 tablets by mouth every 6 (six) hours as needed for pain.     loratadine 10 MG tablet  Commonly known as:  CLARITIN  Take 10 mg by mouth every morning.     metFORMIN 500 MG tablet  Commonly known  as:  GLUCOPHAGE  Take 500 mg by mouth 2 (two) times daily with a meal.     methocarbamol 500 MG tablet  Commonly known as:  ROBAXIN  Take 1 tablet (500 mg total) by mouth 2 (two) times daily.     metoprolol tartrate 25 MG tablet  Commonly known as:  LOPRESSOR  Take 1 tablet (25 mg total) by mouth 2 (two) times daily.     olmesartan 40 MG tablet  Commonly known as:  BENICAR  Take 40 mg by mouth daily.     ondansetron 4 MG disintegrating tablet  Commonly known as:  ZOFRAN ODT  Take 1 tablet (4 mg total) by mouth every 8 (eight) hours as needed for nausea.     ondansetron 4 MG tablet  Commonly known as:  ZOFRAN  Take 1 tablet (4 mg total) by mouth every 6 (six) hours.     simvastatin 20 MG tablet  Commonly known as:  ZOCOR  Take 20 mg by mouth every evening.     traZODone 50 MG tablet  Commonly known as:  DESYREL  Take 1 tablet (50 mg total) by mouth at bedtime.        Disposition and follow-up:   [name]  was discharged from [facility] in Stable condition.  At the hospital follow up visit please address:  1.  Resolution of symptoms, evaluate if repeat colonoscopy needed given atypical norovirus presentation 2.  Repeat BMET, potassium was repleted during hospitalization   Follow-up Appointments: Follow-up Information   Follow up with [name], MD. (Call Dr. Mayford Knife to follow up for ongoing medical problems.)    Contact information:   [address] [place] [address] [phone]       Follow up with Vertell Novak, MD On 06/05/2013. (As scheduled)    Contact information:   [address] [place]                         [place] [address] [phone]       Discharge Instructions: Discharge Orders   Future Appointments Provider Department Dept Phone   07/03/2013 9:30 AM [facility] [address] [facility] AT [facility] [phone]   Patient to arrive 15 minutes prior to appointment  time.   07/03/2013 11:00 AM Delight Ovens, MD [facility] [phone]   Future Orders Complete By Expires     Call MD for:  persistant dizziness or light-headedness  As directed     Call MD for:  persistant nausea and vomiting  As directed     Call MD for:  severe uncontrolled pain  As directed     Call MD for:  temperature >100.4  As directed     Diet - low sodium heart healthy  As directed     Discharge instructions  As directed     Comments:      You had colitis due to a virus called Norovirus.  It will take some time for your symptoms to completely resolve, but you may continue dicyclomine three times daily with meals to help with the crampy pain.  Be sure to follow up with Dr. [name] as well.  Continue a bland diet until you are back to your normal.    Increase activity slowly  As directed        Consultations:  [facility]  Procedures Performed:  Ct Abdomen Pelvis W Contrast 05/27/2013    Findings: Motion artifact in the lung bases.  Infiltration is not excluded.  Small esophageal hiatal hernia with distal esophageal wall thickening suggesting reflux esophagitis.  The liver, spleen, gallbladder, pancreas, adrenal glands, kidneys, abdominal aorta, inferior vena cava, and retroperitoneal lymph nodes are unremarkable.  The stomach and small bowel are not distended.  There is diffuse edema in the wall of the colon suggesting colitis.  This could be due to pseudomembranous colitis, infectious or inflammatory process.  No free air or free fluid in the abdomen.  Pelvis:  Uterus and ovaries are not enlarged.  Bladder wall is not thickened.  Suggestion of a small fibroid in the uterus measuring less than 1 cm diameter.  No free or loculated pelvic fluid collections.  No evidence of diverticulitis.  The appendix is normal.  Degenerative changes in the lumbar spine.  No destructive bone lesions appreciated.   IMPRESSION: Diffuse colonic edema suggesting  nonspecific colitis.    Dg Abd 2 Views 05/31/2013  Findings: No free intraperitoneal air is identified.  The bowel gas pattern is normal.  Changes of colitis seen on the patient's CT scan are not appreciated on plain film.   IMPRESSION: Negative exam.  Dg Abd Acute W/chest 05/21/2013    Findings: Stable postsurgical changes on the right with elevation of the right hemidiaphragm.  The heart remains normal in size. Clear lungs.  Minimal central peribronchial thickening.  Normal bowel gas pattern without free peritoneal air.  Lumbar spine degenerative changes.  IMPRESSION:  1.  No acute abnormality. 2.  Minimal chronic bronchitic changes.      Admission HPI:  62 yo female with a hx of Lung ca Rt uppr lobe, Bipolar and anxiety dsd, enteritis due to c diff two months ago, with a remote hx of drug abuse. Patient presented with worsening hx of generalized abdominal pain this morning, 10 out of 10 pain,initially intermittent and now constant, associated with vomiting and diarrhea. Emesis is described as Tullos and non bloody, and has been present for the past two days. Diarrhea is described as watery, mucoid with some blood (more than streaks, but not voluminous), has had 13 episodes on the day of admission. Px reports she had the same exact symptoms when she was treated for C.diff twice this year for which was hospitalized and place on Antibiotics, she reports that she was compliant with her medications. She notes associated chills and subjective fever.  Of note, her last heroine use- 15 yrs ago, Last use of crack and alcohol intake- 5 yrs ago  Admission Physical Exam:  Blood pressure 133/100, pulse 104, temperature 98.7 F (37.1 C), temperature source Oral, resp. rate 20, SpO2 100.00%.  BP 133/100  Pulse 104  Temp(Src) 98.7 F (37.1 C) (Oral)  Resp 20  SpO2 100%  General Appearance:  Alert, cooperative, in severe pain, restless, appears stated age   Head:  Normocephalic, without obvious  abnormality, atraumatic   Throat:  Lips, mucosa, and tongue normal; teeth and gums normal   Back:  Symmetric, no curvature, ROM normal, no CVA tenderness   Lungs:  Clear to auscultation bilaterally, respirations unlabored   Chest Wall:  No tenderness or deformity   Heart:  tachycardic and rhythm, S1 and S2 normal, no murmur, rub or gallop   Abdomen:  Soft, diffusely tender, bowel sounds active all four quadrants,  papalpable mass in RUQ, spleen not enlarged.   Rectal:  No ulcers, skin tags or fissures, Normal sphincter tone,rectal vault is empty,gloved finger not stained with feaces, but had some mucus. Later patient had any episode of bloody stool that was bright red.   Extremities:  Extremities normal, atraumatic, no cyanosis or edema   Neurologic:  Oriented to TPP, CNII-XII intact.    Admission Lab results:  Basic Metabolic Panel:   05/26/13 1310   NA  139   K  3.6   CL  102   CO2  27   GLUCOSE  125*   BUN  12   CREATININE  0.65   CALCIUM  9.1   AG 10  Lactic Acid 1.1   Liver Function Tests:   05/26/13 1310   AST  18   ALT  15   ALKPHOS  65   BILITOT  0.5   PROT  6.5   ALBUMIN  3.5     05/26/13 1729   LIPASE  15    CBC:   05/26/13 2002   WBC  7.7   NEUTROABS  4.8   HGB  13.6   HCT  41.3   MCV  85.2   PLT  274    Urine Drug Screen:   05/26/2013 14:22   Amphetamines  NONE DETECTED  Barbiturates  NONE DETECTED   Benzodiazepines  POSITIVE (A)   Opiates  POSITIVE (A)   COCAINE  NONE DETECTED   Tetrahydrocannabinol  POSITIVE (A)   Urinalysis:    05/26/13 1422   COLORURINE  AMBER*   LABSPEC  1.031*   PHURINE  7.5   GLUCOSEU  NEGATIVE   HGBUR  NEGATIVE   BILIRUBINUR  SMALL*   KETONESUR  NEGATIVE   PROTEINUR  NEGATIVE   UROBILINOGEN  0.2   NITRITE  NEGATIVE   LEUKOCYTESUR  TRACE*     03/07/2013 13:32   Hgb urine dipstick  NEGATIVE   Urine-Other  LESS THAN 10 mL OF URINE SUBMITTED   WBC, UA  0-2   RBC / HPF  0-2   Squamous Epithelial / LPF  FEW  (A)   Bacteria, UA  RARE   Casts  HYALINE CASTS (A)    C.Diff: Negative    Hospital Course by problem list: 62 yo F with history of c.diff diarrhea, HTN, DM, and Hep B/C admitted on 05/26/13 with abdominal pain.  #Norovirus colitis: unusual presentation as Norovirus usually affects small bowel and is not bloody (one episode of witnessed blood streaked mucous), but improving by day of discharge.  Patient may have also had reactive colitis given h/o C. Diff (last positive C.diff was in April 2014).  Colonoscopy in 01/2013 only significant for looping bowel and 1 polyp but no changes to suggest inflammatory bowel disease.  Ischemic colitis was considered, but lactic acid wnl x 2.  Patient required significant pain mgmt during hospitalization, up to Dilaudid 2mg  q3h prn (taking almost scheduled) which was eventually weaned down to Norco 10-325, 1-2 tabs q4h prn.  Pain improved with dicyclomine and she was also discharged with dicyclomine & Norco 5-325, 1-2 tabs q6h prn, #40, as patient reported she did not have any more pain medication at home.  Potassium at discharge was 3.2, which was repleted. She tolerated diet at discharge (low residue).  She will have outpatient follow up with Dr. [name].  # DM2- Currently controlled with , HBA1C- 6.1, 03/08/2013.  Metformin held during hospitalization & treated with SSI. Metformin restarted at discharge.  #HTN: Home regimen includes metoprolol & benicar. BPs had been elevated as high as 195/112 while getting IVF and when in pain.  Also, xanax was initially held. BP improved by day of discharge to 145/73, and home regiment was restarted.  #Bipolar: Per patient, 10 year history of bipolar disorder treated with gabapentin. Patient also reports xanax for anxiety. Confirmed this medication regimen with the patient's pharmacy.  She was treated with alprazolam during hospitalization.  Discharge Vitals:   BP 145/73  Pulse 73  Temp(Src) 98.6 F (37 C) (Oral)  Resp 16   Ht 5\' 4"  (1.626 m)  Wt 143 lb 1.3 oz (64.9 kg)  BMI 24.55 kg/m2  SpO2 98%  Discharge Labs:  Results for orders placed during the hospital encounter of 05/26/13 (from the past 24 hour(s))  GLUCOSE, CAPILLARY     Status: None   Collection Time    05/30/13  5:32 PM      Result Value Range   Glucose-Capillary 97  70 - 99 mg/dL   Comment 1 Documented in Chart     Comment 2 Notify RN    GLUCOSE, CAPILLARY     Status: Abnormal   Collection Time    05/31/13 12:02 AM      Result Value Range   Glucose-Capillary 108 (*) 70 - 99 mg/dL  Comment 1 Documented in Chart     Comment 2 Notify RN    GLUCOSE, CAPILLARY     Status: Abnormal   Collection Time    05/31/13  5:51 AM      Result Value Range   Glucose-Capillary 123 (*) 70 - 99 mg/dL  GLUCOSE, CAPILLARY     Status: Abnormal   Collection Time    05/31/13  7:58 AM      Result Value Range   Glucose-Capillary 115 (*) 70 - 99 mg/dL  GLUCOSE, CAPILLARY     Status: Abnormal   Collection Time    05/31/13 12:44 PM      Result Value Range   Glucose-Capillary 126 (*) 70 - 99 mg/dL    Signed: [name], MD 05/31/2013, 3:18 PM   Time Spent on Discharge: 35 minutes

## 2013-05-31 NOTE — Progress Notes (Signed)
   I have seen the patient and reviewed the daily progress note by [name] and discussed the care of the patient with them.  See below for documentation of my findings, assessment, and plans.  Subjective: Seems to have improved. No further vomiting and tolerated liquids. 1 formed stool this morning, nonbloody.  Eager to try regular diet.   Objective: Vital signs in last 24 hours: Filed Vitals:   05/30/13 1700 05/30/13 2205 05/31/13 0553 05/31/13 1053  BP: 147/73 155/83 138/86 145/73  Pulse:  73 92 73  Temp:  98.4 F (36.9 C) 98.6 F (37 C)   TempSrc:  Oral Oral   Resp:  18 16   Height:      Weight:   143 lb 1.3 oz (64.9 kg)   SpO2:  100% 98%    Weight change: 14.1 oz (0.4 kg)  Intake/Output Summary (Last 24 hours) at 05/31/13 1501 Last data filed at 05/31/13 1248  Gross per 24 hour  Intake 2398.33 ml  Output   1102 ml  Net 1296.33 ml   General: resting in bed, no acute distress Cardiac: RRR, no rubs, murmurs or gallops Pulm: clear to auscultation bilaterally, moving normal volumes of air Abd: soft, mildly tender diffusely, nondistended, BS present Ext: warm and well perfused, no pedal edema Neuro: alert and oriented X3, cranial nerves II-XII grossly intact  Lab Results: Reviewed and documented in Electronic Record Micro Results: Reviewed and documented in Electronic Record Studies/Results: Reviewed and documented in Electronic Record  Medications: I have reviewed the patient's current medications. Scheduled Meds: . ALPRAZolam  0.5 mg Oral BID  . aspirin EC  81 mg Oral Daily  . dicyclomine  20 mg Oral TID AC  . enoxaparin (LOVENOX) injection  40 mg Subcutaneous Q24H  . insulin aspart  0-9 Units Subcutaneous Q6H  . irbesartan  300 mg Oral Daily  . loratadine  10 mg Oral Daily  . metoprolol tartrate  25 mg Oral BID  . potassium chloride  40 mEq Oral Q4H  . sodium chloride  3 mL Intravenous Q12H   Continuous Infusions:  PRN  Meds:.HYDROcodone-acetaminophen, Ipratropium-Albuterol, ondansetron (ZOFRAN) IV, traZODone, white petrolatum  Assessment/Plan: 62 yo F with history of c.diff diarrhea, HTN, DM, and Hep B/C admitted on 05/26/13 with abdominal pain  #Norovirus colitis: unusual presentation, but improving. AXR, PVR and UA yesterday unremarkable.  -d/c IVF, Advance diet -Replete K -PO pain regimen with vicodin & dicyclomine -Outpt follow up with Dr. [name]  --> Remainder per MSIV note  Dispo: Disposition is deferred at this time, awaiting improvement of current medical problems.  Anticipate home today if diet tolerated.  The patient does have a current PCP [name], MD) and does not need an [facility] hospital follow-up appointment after discharge.  The patient does not have transportation limitations that hinder transportation to clinic appointments.  .Services Needed at time of discharge: Y = Yes, Blank = No PT:   OT:   RN:   Equipment:   Other:     LOS: 5 days   [name], MD 05/31/2013, 3:01 PM

## 2013-06-02 LAB — CULTURE, BLOOD (ROUTINE X 2)

## 2013-06-02 NOTE — Discharge Summary (Signed)
  Date: 06/02/2013  Patient name: [name] [name]  Medical record number: [id number]  Date of birth: 1951/05/15   This patient has been discussed with the house staff. Please see their note for complete details. I concur with their findings and plan.  Jonah Blue, DO 06/02/2013, 7:48 AM

## 2013-06-24 ENCOUNTER — Other Ambulatory Visit: Payer: Self-pay | Admitting: Orthopedic Surgery

## 2013-07-02 ENCOUNTER — Encounter (HOSPITAL_COMMUNITY): Payer: Self-pay | Admitting: Pharmacy Technician

## 2013-07-03 ENCOUNTER — Encounter: Payer: Self-pay | Admitting: Cardiothoracic Surgery

## 2013-07-03 ENCOUNTER — Ambulatory Visit (INDEPENDENT_AMBULATORY_CARE_PROVIDER_SITE_OTHER): Payer: Medicaid Other | Admitting: Cardiothoracic Surgery

## 2013-07-03 ENCOUNTER — Ambulatory Visit
Admission: RE | Admit: 2013-07-03 | Discharge: 2013-07-03 | Disposition: A | Discharge status: Home / Self Care | Payer: Medicaid Other | Source: Ambulatory Visit | Attending: Cardiothoracic Surgery | Admitting: Cardiothoracic Surgery

## 2013-07-03 DIAGNOSIS — C3410 Malignant neoplasm of upper lobe, unspecified bronchus or lung: Secondary | ICD-10-CM

## 2013-07-03 DIAGNOSIS — Z9889 Other specified postprocedural states: Secondary | ICD-10-CM

## 2013-07-03 DIAGNOSIS — Z902 Acquired absence of lung [part of]: Secondary | ICD-10-CM

## 2013-07-03 DIAGNOSIS — D381 Neoplasm of uncertain behavior of trachea, bronchus and lung: Secondary | ICD-10-CM

## 2013-07-03 NOTE — Progress Notes (Signed)
[address]       [place] [address]             [phone]                                            [facility]fretta [facility] Medical Record #[id number] Date of Birth: [birth year]  [name], MD [name], MD  Chief Complaint:   PostOp Follow Up Visit (3 month follow up)  12/04/2012  OPERATIVE REPORT  PREOPERATIVE DIAGNOSIS: Slowly enlarging right upper lobe lung nodule.  POSTOPERATIVE DIAGNOSIS: Carcinoma of the right upper lobe.  PROCEDURE PERFORMED: Bronchoscopy, right video-assisted thoracoscopy,  mini thoracotomy, wedge resection, right upper lobe and completion right  upper lobectomy with lymph node sampling, placement of On-Q device.   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 No bowel problems now, cdiff has cleared per the patient  No pulmonary symptoms, no hemptysis  To have back surgery end of AUG.   History  Smoking status  . Current Some Day Smoker -- 10 years  . Types: Cigarettes  Smokeless tobacco  . Never Used    Comment: Smokes 1/2 pack per week       Allergies  Allergen Reactions  . Haldol [Haloperidol Decanoate] Other (See Comments)    tongue swelling  . Metronidazole Other (See Comments)    Severe stomach upset--was instructed to avoid Flagyl.  . Morphine And Related Hives    hives  . Penicillins Rash    Current Outpatient Prescriptions  Medication Sig Dispense Refill  . acetaminophen (TYLENOL) 500 MG tablet Take 1 tablet (500 mg total) by mouth every 6 (six) hours as needed for pain.  30 tablet  0  . ALPRAZolam (XANAX) 1 MG tablet Take 0.5 mg by mouth 2 (two) times daily as needed for anxiety.       Marland Kitchen aspirin EC 81 MG tablet Take 81 mg by mouth daily.      Marland Kitchen dicyclomine (BENTYL) 20 MG tablet Take 1 tablet (20 mg total) by mouth 3 (three) times daily with meals.  90 tablet  0  .  ergocalciferol (VITAMIN D2) 50000 UNITS capsule Take 50,000 Units by mouth every Monday.      . gabapentin (NEURONTIN) 400 MG capsule Take 400 mg by mouth 3 (three) times daily.       Marland Kitchen HYDROcodone-acetaminophen (NORCO/VICODIN) 5-325 MG per tablet Take 1-2 tablets by mouth every 6 (six) hours as needed for pain.  40 tablet  0  . Ipratropium-Albuterol (COMBIVENT RESPIMAT) 20-100 MCG/ACT AERS respimat Inhale 1 puff into the lungs every 6 (six) hours.      Marland Kitchen loratadine (CLARITIN) 10 MG tablet Take 10 mg by mouth every morning.       . metFORMIN (GLUCOPHAGE) 500 MG tablet Take 500 mg by mouth 2 (two) times daily with a meal.       . methocarbamol (ROBAXIN) 500 MG tablet Take 1 tablet (500 mg total) by mouth 2 (two) times daily.  20 tablet  0  . olmesartan-hydrochlorothiazide (BENICAR HCT) 40-12.5 MG per tablet Take 1 tablet by mouth daily.      . Omega-3 Fatty Acids (FISH OIL PO) Take 1 capsule by mouth daily.      Marland Kitchen  ondansetron (ZOFRAN) 4 MG tablet Take 1 tablet (4 mg total) by mouth every 6 (six) hours.  12 tablet  0  . simvastatin (ZOCOR) 20 MG tablet Take 20 mg by mouth every evening.      . traZODone (DESYREL) 50 MG tablet Take 50 mg by mouth at bedtime as needed for sleep.       No current facility-administered medications for this visit.       Physical Exam: BP 139/90  Pulse 90  Resp 16  Ht 5\' 4"  (1.626 m)  Wt 145 lb (65.772 kg)  BMI 24.88 kg/m2  SpO2 98%  General appearance: alert and cooperative Neurologic: intact Heart: regular rate and rhythm, S1, S2 normal, no murmur, click, rub or gallop Lungs: clear to auscultation bilaterally and normal percussion bilaterally Abdomen: soft, non-tender; bowel sounds normal; no masses,  no organomegaly Extremities: extremities normal, atraumatic, no cyanosis or edema and Homans sign is negative, no sign of DVT Wound: well healed rt chest sites No palpable cervical or axillary nodes   Diagnostic Studies & Laboratory data:         Recent  Radiology Findings: Ct Chest Wo Contrast  07/03/2013   *RADIOLOGY REPORT*  Clinical Data: History of right upper lobectomy for resection of right upper lobe carcinoma in January 2014, now with some chest wall pain.  Former smoking history.  CT CHEST WITHOUT CONTRAST  Technique:  Multidetector CT imaging of the chest was performed following the standard protocol without IV contrast.  Comparison: CT chest of 10/08/2012 and chest x-ray of 05/21/2013  Findings: On the lung window images, postoperative changes from resection of the right upper lobe lesion are noted.  However there is no evidence of recurrence of lung carcinoma.  Mild centrilobular emphysematous change is again noted.  The central bronchi are patent.  No pleural effusion is seen.  On soft tissue window images, the thyroid gland is unremarkable. On this unenhanced study, no mediastinal or hilar adenopathy is seen.  Cardiomegaly is stable.  The portion of the upper abdomen that is visible on this unenhanced study is unremarkable.  No axillary adenopathy is seen.  IMPRESSION: No evidence of recurrence of lung carcinoma.   Original Report Authenticated By: Dwyane Dee, M.D.      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, pT1a,pN0, Mx  Recent Labs: Lab Results  Component Value Date   WBC 4.4 05/30/2013   HGB 14.2 05/30/2013   HCT 42.1 05/30/2013   PLT 213 05/30/2013   GLUCOSE 118* 05/30/2013   ALT 20 05/30/2013   AST 21 05/30/2013   NA 137 05/30/2013   K 3.2* 05/30/2013   CL 102 05/30/2013   CREATININE 0.41* 05/30/2013   BUN 6 05/30/2013   CO2 22 05/30/2013   TSH 2.492 01/02/2013   INR 1.14 03/10/2013   HGBA1C 6.1* 03/08/2013      Assessment / Plan:   Patient's approximately 8 months status post right upper lobectomy for stage I well-differentiated mucin producing adenocarcinoma Ct scan today shows no evidence of recurrance Presented at [facility] Conference- no additional rx recommended for  Lung Ca , follow up only needed -we'll plan on followup visit in 6 months with a CT scan of the chest, which will be approximately 1 year  months following her original surgery     Alleen Kehm B 07/03/2013 10:25 AM

## 2013-07-03 NOTE — Patient Instructions (Signed)
Ct of chest looks good today Return in 6 months for repeat ct scan of chest

## 2013-07-09 ENCOUNTER — Encounter (HOSPITAL_COMMUNITY): Payer: Self-pay

## 2013-07-09 ENCOUNTER — Encounter (HOSPITAL_COMMUNITY)
Admission: RE | Admit: 2013-07-09 | Discharge: 2013-07-09 | Disposition: A | Discharge status: Home / Self Care | Payer: Medicaid Other | Source: Ambulatory Visit | Attending: Orthopedic Surgery | Admitting: Orthopedic Surgery

## 2013-07-09 DIAGNOSIS — Z01812 Encounter for preprocedural laboratory examination: Secondary | ICD-10-CM | POA: Insufficient documentation

## 2013-07-09 HISTORY — DX: Personal history of other diseases of the digestive system: Z87.19

## 2013-07-09 HISTORY — DX: Chronic obstructive pulmonary disease, unspecified: J44.9

## 2013-07-09 HISTORY — DX: Gastro-esophageal reflux disease without esophagitis: K21.9

## 2013-07-09 LAB — URINALYSIS, ROUTINE W REFLEX MICROSCOPIC
Bilirubin Urine: NEGATIVE
Ketones, ur: NEGATIVE mg/dL
Nitrite: NEGATIVE
Specific Gravity, Urine: 1.027 (ref 1.005–1.030)
Urobilinogen, UA: 1 mg/dL (ref 0.0–1.0)
pH: 6 (ref 5.0–8.0)

## 2013-07-09 LAB — APTT: aPTT: 26 seconds (ref 24–37)

## 2013-07-09 LAB — CBC WITH DIFFERENTIAL/PLATELET
Basophils Absolute: 0.1 10*3/uL (ref 0.0–0.1)
Basophils Relative: 1 % (ref 0–1)
Eosinophils Absolute: 0.1 10*3/uL (ref 0.0–0.7)
Hemoglobin: 14 g/dL (ref 12.0–15.0)
MCH: 29.3 pg (ref 26.0–34.0)
MCHC: 33.3 g/dL (ref 30.0–36.0)
Neutro Abs: 6.5 10*3/uL (ref 1.7–7.7)
Neutrophils Relative %: 78 % — ABNORMAL HIGH (ref 43–77)
Platelets: 225 10*3/uL (ref 150–400)
RDW: 15.3 % (ref 11.5–15.5)

## 2013-07-09 LAB — URINE MICROSCOPIC-ADD ON

## 2013-07-09 LAB — SURGICAL PCR SCREEN
MRSA, PCR: NEGATIVE
Staphylococcus aureus: NEGATIVE

## 2013-07-09 LAB — COMPREHENSIVE METABOLIC PANEL
AST: 17 U/L (ref 0–37)
Albumin: 3.8 g/dL (ref 3.5–5.2)
Alkaline Phosphatase: 59 U/L (ref 39–117)
Chloride: 104 mEq/L (ref 96–112)
Potassium: 3.7 mEq/L (ref 3.5–5.1)
Sodium: 141 mEq/L (ref 135–145)
Total Bilirubin: 0.4 mg/dL (ref 0.3–1.2)
Total Protein: 6.8 g/dL (ref 6.0–8.3)

## 2013-07-09 LAB — TYPE AND SCREEN: Antibody Screen: NEGATIVE

## 2013-07-09 LAB — PROTIME-INR: Prothrombin Time: 13 seconds (ref 11.6–15.2)

## 2013-07-09 NOTE — Pre-Procedure Instructions (Addendum)
Safiyyah Vasconez  07/09/2013   Your procedure is scheduled on:  8.27.14  Report to [facility] at 630 AM.  Call this number if you have problems the morning of surgery: [phone]   Remember:   Do not eat food or drink liquids after midnight.   Take these medicines the morning of surgery with A SIP OF WATER: gabapentin,  If needed may take pain med, xanax, zofran, robaxin ,use inhaler             STOP aspirin, fish oil  07/11/13   Do not wear jewelry, make-up or nail polish.  Do not wear lotions, powders, or perfumes. You may wear deodorant.  Do not shave 48 hours prior to surgery. Men may shave face and neck.  Do not bring valuables to the hospital.  [facility] is not responsible                   for any belongings or valuables.  Contacts, dentures or bridgework may not be worn into surgery.  Leave suitcase in the car. After surgery it may be brought to your room.  For patients admitted to the hospital, checkout time is 11:00 AM the day of  discharge.   Patients discharged the day of surgery will not be allowed to drive  home.  Name and phone number of your driver:   Special Instructions: Incentive Spirometry - Practice and bring it with you on the day of surgery. Shower using CHG 2 nights before surgery and the night before surgery.  If you shower the day of surgery use CHG.  Use special wash - you have one bottle of CHG for all showers.  You should use approximately 1/3 of the bottle for each shower.   Please read over the following fact sheets that you were given: Pain Booklet, Coughing and Deep Breathing, Blood Transfusion Information, MRSA Information and Surgical Site Infection Prevention

## 2013-07-16 ENCOUNTER — Encounter (HOSPITAL_COMMUNITY): Admission: RE | Payer: Self-pay | Source: Ambulatory Visit

## 2013-07-16 ENCOUNTER — Ambulatory Visit (HOSPITAL_COMMUNITY): Admission: RE | Admit: 2013-07-16 | Payer: Medicaid Other | Source: Ambulatory Visit | Admitting: Orthopedic Surgery

## 2013-07-16 SURGERY — POSTERIOR LUMBAR FUSION 1 LEVEL
Anesthesia: General

## 2013-07-23 ENCOUNTER — Other Ambulatory Visit: Payer: Self-pay | Admitting: Orthopedic Surgery

## 2013-08-01 ENCOUNTER — Encounter (HOSPITAL_COMMUNITY)
Admission: RE | Admit: 2013-08-01 | Discharge: 2013-08-01 | Disposition: A | Discharge status: Home / Self Care | Payer: Medicaid Other | Source: Ambulatory Visit | Attending: Orthopedic Surgery | Admitting: Orthopedic Surgery

## 2013-08-01 ENCOUNTER — Encounter (HOSPITAL_COMMUNITY): Payer: Self-pay

## 2013-08-01 ENCOUNTER — Ambulatory Visit (HOSPITAL_COMMUNITY)
Admission: RE | Admit: 2013-08-01 | Discharge: 2013-08-01 | Disposition: A | Discharge status: Home / Self Care | Payer: Medicaid Other | Source: Ambulatory Visit | Attending: Orthopedic Surgery | Admitting: Orthopedic Surgery

## 2013-08-01 DIAGNOSIS — M545 Low back pain, unspecified: Secondary | ICD-10-CM | POA: Insufficient documentation

## 2013-08-01 DIAGNOSIS — Z01818 Encounter for other preprocedural examination: Secondary | ICD-10-CM | POA: Insufficient documentation

## 2013-08-01 DIAGNOSIS — R9431 Abnormal electrocardiogram [ECG] [EKG]: Secondary | ICD-10-CM | POA: Insufficient documentation

## 2013-08-01 DIAGNOSIS — M79609 Pain in unspecified limb: Secondary | ICD-10-CM | POA: Insufficient documentation

## 2013-08-01 DIAGNOSIS — Z01810 Encounter for preprocedural cardiovascular examination: Secondary | ICD-10-CM | POA: Insufficient documentation

## 2013-08-01 DIAGNOSIS — Z01812 Encounter for preprocedural laboratory examination: Secondary | ICD-10-CM | POA: Insufficient documentation

## 2013-08-01 HISTORY — DX: Headache: R51

## 2013-08-01 LAB — URINALYSIS, ROUTINE W REFLEX MICROSCOPIC
Glucose, UA: NEGATIVE mg/dL
Ketones, ur: NEGATIVE mg/dL
Leukocytes, UA: NEGATIVE
Protein, ur: NEGATIVE mg/dL
pH: 6.5 (ref 5.0–8.0)

## 2013-08-01 LAB — CBC WITH DIFFERENTIAL/PLATELET
Basophils Absolute: 0.1 10*3/uL (ref 0.0–0.1)
Basophils Relative: 1 % (ref 0–1)
Eosinophils Relative: 3 % (ref 0–5)
HCT: 41.6 % (ref 36.0–46.0)
Hemoglobin: 14.1 g/dL (ref 12.0–15.0)
MCHC: 33.9 g/dL (ref 30.0–36.0)
MCV: 88.1 fL (ref 78.0–100.0)
Monocytes Absolute: 0.4 10*3/uL (ref 0.1–1.0)
Monocytes Relative: 6 % (ref 3–12)
RDW: 14.6 % (ref 11.5–15.5)

## 2013-08-01 LAB — COMPREHENSIVE METABOLIC PANEL
AST: 18 U/L (ref 0–37)
Albumin: 3.9 g/dL (ref 3.5–5.2)
BUN: 20 mg/dL (ref 6–23)
CO2: 26 mEq/L (ref 19–32)
Calcium: 9.9 mg/dL (ref 8.4–10.5)
Creatinine, Ser: 0.67 mg/dL (ref 0.50–1.10)
GFR calc non Af Amer: >90 mL/min (ref >90)
Total Bilirubin: 0.3 mg/dL (ref 0.3–1.2)

## 2013-08-01 LAB — SURGICAL PCR SCREEN
MRSA, PCR: NEGATIVE
Staphylococcus aureus: NEGATIVE

## 2013-08-01 LAB — PROTIME-INR
INR: 0.92 (ref 0.00–1.49)
Prothrombin Time: 12.2 seconds (ref 11.6–15.2)

## 2013-08-01 LAB — TYPE AND SCREEN: ABO/RH(D): O POS

## 2013-08-01 NOTE — Pre-Procedure Instructions (Addendum)
[name] [name]  08/01/2013   Your procedure is scheduled on: 08/07/13  Report to [facility] at530 AM.  Call this number if you have problems the morning of surgery: [phone]   Remember:   Do not eat food or drink liquids after midnight.   Take these medicines the morning of surgery with A SIP OF WATER: xanax if needed, pain med if needed, gabapentin, use inhaler if needed          STOP aspirin, omega 3 now   Do not wear jewelry, make-up or nail polish.  Do not wear lotions, powders, or perfumes. You may wear deodorant.  Do not shave 48 hours prior to surgery. Men may shave face and neck.  Do not bring valuables to the hospital.  [facility] is not responsible                   for any belongings or valuables.  Contacts, dentures or bridgework may not be worn into surgery.  Leave suitcase in the car. After surgery it may be brought to your room.  For patients admitted to the hospital, checkout time is 11:00 AM the day of  discharge.   Patients discharged the day of surgery will not be allowed to drive  home.  Name and phone number of your driver:   Special Instructions: Incentive Spirometry - Practice and bring it with you on the day of surgery. Shower using CHG 2 nights before surgery and the night before surgery.  If you shower the day of surgery use CHG.  Use special wash - you have one bottle of CHG for all showers.  You should use approximately 1/3 of the bottle for each shower.   Please read over the following fact sheets that you were given: Pain Booklet, Coughing and Deep Breathing, Blood Transfusion Information, MRSA Information and Surgical Site Infection Prevention

## 2013-08-05 NOTE — Progress Notes (Signed)
Anesthesia chart review: [name] a 62 year old female scheduled for left sided L4-S1 transforaminal lumbar interbody fusion on 08/07/2013 by Dr. Yevette Edwards.  History includes former smoker, hypertension, diabetes mellitus type 2, bipolar 1 disorder, heart murmur (not specified), anxiety, stage I lung cancer (adenocarcinoma) s/p RU lobectomy 12/04/12, COPD, GERD, hiatal hernia, diverticulosis, hepatitis B and C.  EKG on 08/01/13 showed SR with first degree AVB with PAC's, possible LAE, septal infarct (age undetermined).  Rate was slower when compared to her previous EKG from 03/09/13.  Preoperative chest x-ray and labs noted.  LFTS and PLT count were WNL.  She tolerated thoracic surgery earlier this year.  If no new cardiopulmonary symptoms then I would anticipate that she could proceed as planned.  Velna Ochs [facility]/Anesthesiology Phone [phone] 08/05/2013 11:50 AM

## 2013-08-06 MED ORDER — VANCOMYCIN HCL IN DEXTROSE 1-5 GM/200ML-% IV SOLN
1000.0000 mg | INTRAVENOUS | Status: AC
  Administered 2013-08-07: 1000 mg via INTRAVENOUS
  Filled 2013-08-06: qty 200

## 2013-08-07 ENCOUNTER — Inpatient Hospital Stay (HOSPITAL_COMMUNITY): Payer: Medicaid Other

## 2013-08-07 ENCOUNTER — Inpatient Hospital Stay (HOSPITAL_COMMUNITY)
Admission: RE | Admit: 2013-08-07 | Discharge: 2013-08-14 | DRG: 460 | Disposition: A | Discharge status: Home Health | Payer: Medicaid Other | Source: Ambulatory Visit | Attending: Orthopedic Surgery | Admitting: Orthopedic Surgery

## 2013-08-07 ENCOUNTER — Encounter (HOSPITAL_COMMUNITY): Payer: Self-pay | Admitting: Vascular Surgery

## 2013-08-07 ENCOUNTER — Inpatient Hospital Stay (HOSPITAL_COMMUNITY): Payer: Medicaid Other | Admitting: Anesthesiology

## 2013-08-07 ENCOUNTER — Encounter (HOSPITAL_COMMUNITY)
Admission: RE | Disposition: A | Discharge status: Home Health | Payer: Self-pay | Source: Ambulatory Visit | Attending: Orthopedic Surgery

## 2013-08-07 ENCOUNTER — Other Ambulatory Visit: Payer: Self-pay

## 2013-08-07 ENCOUNTER — Encounter (HOSPITAL_COMMUNITY): Payer: Self-pay | Admitting: Anesthesiology

## 2013-08-07 DIAGNOSIS — F1210 Cannabis abuse, uncomplicated: Secondary | ICD-10-CM | POA: Diagnosis present

## 2013-08-07 DIAGNOSIS — J449 Chronic obstructive pulmonary disease, unspecified: Secondary | ICD-10-CM | POA: Diagnosis present

## 2013-08-07 DIAGNOSIS — F3110 Bipolar disorder, current episode manic without psychotic features, unspecified: Secondary | ICD-10-CM | POA: Diagnosis not present

## 2013-08-07 DIAGNOSIS — B1920 Unspecified viral hepatitis C without hepatic coma: Secondary | ICD-10-CM | POA: Diagnosis present

## 2013-08-07 DIAGNOSIS — J4489 Other specified chronic obstructive pulmonary disease: Secondary | ICD-10-CM | POA: Diagnosis present

## 2013-08-07 DIAGNOSIS — E119 Type 2 diabetes mellitus without complications: Secondary | ICD-10-CM | POA: Diagnosis present

## 2013-08-07 DIAGNOSIS — F411 Generalized anxiety disorder: Secondary | ICD-10-CM | POA: Diagnosis present

## 2013-08-07 DIAGNOSIS — I10 Essential (primary) hypertension: Secondary | ICD-10-CM | POA: Diagnosis present

## 2013-08-07 DIAGNOSIS — K219 Gastro-esophageal reflux disease without esophagitis: Secondary | ICD-10-CM | POA: Diagnosis present

## 2013-08-07 DIAGNOSIS — Z87891 Personal history of nicotine dependence: Secondary | ICD-10-CM

## 2013-08-07 DIAGNOSIS — M51379 Other intervertebral disc degeneration, lumbosacral region without mention of lumbar back pain or lower extremity pain: Secondary | ICD-10-CM | POA: Diagnosis present

## 2013-08-07 DIAGNOSIS — Q762 Congenital spondylolisthesis: Principal | ICD-10-CM

## 2013-08-07 DIAGNOSIS — Z7982 Long term (current) use of aspirin: Secondary | ICD-10-CM

## 2013-08-07 DIAGNOSIS — Z85118 Personal history of other malignant neoplasm of bronchus and lung: Secondary | ICD-10-CM

## 2013-08-07 DIAGNOSIS — M48061 Spinal stenosis, lumbar region without neurogenic claudication: Secondary | ICD-10-CM | POA: Diagnosis present

## 2013-08-07 DIAGNOSIS — B1910 Unspecified viral hepatitis B without hepatic coma: Secondary | ICD-10-CM | POA: Diagnosis present

## 2013-08-07 DIAGNOSIS — M5137 Other intervertebral disc degeneration, lumbosacral region: Secondary | ICD-10-CM | POA: Diagnosis present

## 2013-08-07 DIAGNOSIS — Z79899 Other long term (current) drug therapy: Secondary | ICD-10-CM

## 2013-08-07 LAB — GLUCOSE, CAPILLARY: Glucose-Capillary: 90 mg/dL (ref 70–99)

## 2013-08-07 SURGERY — POSTERIOR LUMBAR FUSION 1 LEVEL
Anesthesia: General | Site: Spine Lumbar | Laterality: Left | Wound class: Clean

## 2013-08-07 MED ORDER — DOCUSATE SODIUM 100 MG PO CAPS
100.0000 mg | ORAL_CAPSULE | Freq: Two times a day (BID) | ORAL | Status: DC
  Administered 2013-08-08 – 2013-08-14 (×13): 100 mg via ORAL
  Filled 2013-08-07 (×16): qty 1

## 2013-08-07 MED ORDER — SODIUM CHLORIDE 0.9 % IV SOLN
INTRAVENOUS | Status: DC
  Administered 2013-08-07 – 2013-08-10 (×3): via INTRAVENOUS
  Administered 2013-08-10: 75 mL/h via INTRAVENOUS

## 2013-08-07 MED ORDER — OXYCODONE-ACETAMINOPHEN 5-325 MG PO TABS
1.0000 | ORAL_TABLET | ORAL | Status: DC | PRN
  Administered 2013-08-07 (×2): 1 via ORAL
  Administered 2013-08-08 – 2013-08-12 (×22): 2 via ORAL
  Filled 2013-08-07 (×2): qty 2
  Filled 2013-08-07: qty 1
  Filled 2013-08-07 (×5): qty 2
  Filled 2013-08-07: qty 1
  Filled 2013-08-07 (×14): qty 2

## 2013-08-07 MED ORDER — HYDROMORPHONE HCL PF 1 MG/ML IJ SOLN
INTRAMUSCULAR | Status: AC
  Filled 2013-08-07: qty 1

## 2013-08-07 MED ORDER — ONDANSETRON HCL 4 MG/2ML IJ SOLN: 4.0000 mg | Freq: Once | INTRAMUSCULAR | Status: DC | PRN

## 2013-08-07 MED ORDER — ZOLPIDEM TARTRATE 5 MG PO TABS
5.0000 mg | ORAL_TABLET | Freq: Every evening | ORAL | Status: DC | PRN
  Administered 2013-08-08 – 2013-08-13 (×2): 5 mg via ORAL
  Filled 2013-08-07 (×2): qty 1

## 2013-08-07 MED ORDER — SIMVASTATIN 20 MG PO TABS
20.0000 mg | ORAL_TABLET | Freq: Every evening | ORAL | Status: DC
  Administered 2013-08-07 – 2013-08-13 (×7): 20 mg via ORAL
  Filled 2013-08-07 (×8): qty 1

## 2013-08-07 MED ORDER — ONDANSETRON HCL 4 MG/2ML IJ SOLN
4.0000 mg | Freq: Four times a day (QID) | INTRAMUSCULAR | Status: DC | PRN
  Filled 2013-08-07: qty 2

## 2013-08-07 MED ORDER — FENTANYL 10 MCG/ML IV SOLN
INTRAVENOUS | Status: DC
  Administered 2013-08-07: 13:00:00 via INTRAVENOUS
  Administered 2013-08-07: 190 ug via INTRAVENOUS
  Administered 2013-08-07: 21:00:00 via INTRAVENOUS
  Administered 2013-08-08: 288.5 ug via INTRAVENOUS
  Filled 2013-08-07 (×2): qty 50

## 2013-08-07 MED ORDER — THROMBIN 20000 UNITS EX SOLR
CUTANEOUS | Status: DC | PRN
  Administered 2013-08-07: 09:00:00 via TOPICAL

## 2013-08-07 MED ORDER — IRBESARTAN 300 MG PO TABS
300.0000 mg | ORAL_TABLET | Freq: Every day | ORAL | Status: DC
  Administered 2013-08-07 – 2013-08-14 (×8): 300 mg via ORAL
  Filled 2013-08-07 (×9): qty 1

## 2013-08-07 MED ORDER — POVIDONE-IODINE 7.5 % EX SOLN
Freq: Once | CUTANEOUS | Status: DC
  Filled 2013-08-07: qty 118

## 2013-08-07 MED ORDER — PHENYLEPHRINE HCL 10 MG/ML IJ SOLN
10.0000 mg | INTRAVENOUS | Status: DC | PRN
  Administered 2013-08-07: 20 ug/min via INTRAVENOUS

## 2013-08-07 MED ORDER — DIAZEPAM 5 MG PO TABS
ORAL_TABLET | ORAL | Status: AC
  Filled 2013-08-07: qty 1

## 2013-08-07 MED ORDER — DICYCLOMINE HCL 20 MG PO TABS
20.0000 mg | ORAL_TABLET | Freq: Three times a day (TID) | ORAL | Status: DC
  Administered 2013-08-08 – 2013-08-14 (×19): 20 mg via ORAL
  Filled 2013-08-07 (×23): qty 1

## 2013-08-07 MED ORDER — OXYCODONE-ACETAMINOPHEN 5-325 MG PO TABS
ORAL_TABLET | ORAL | Status: AC
  Filled 2013-08-07: qty 1

## 2013-08-07 MED ORDER — SODIUM CHLORIDE 0.9 % IJ SOLN
3.0000 mL | Freq: Two times a day (BID) | INTRAMUSCULAR | Status: DC
  Administered 2013-08-09: 3 mL via INTRAVENOUS

## 2013-08-07 MED ORDER — GABAPENTIN 400 MG PO CAPS
400.0000 mg | ORAL_CAPSULE | Freq: Three times a day (TID) | ORAL | Status: DC
  Administered 2013-08-07 – 2013-08-13 (×19): 400 mg via ORAL
  Filled 2013-08-07 (×21): qty 1

## 2013-08-07 MED ORDER — FLEET ENEMA 7-19 GM/118ML RE ENEM: 1.0000 | ENEMA | Freq: Once | RECTAL | Status: AC | PRN

## 2013-08-07 MED ORDER — OLMESARTAN MEDOXOMIL-HCTZ 40-12.5 MG PO TABS: 1.0000 | ORAL_TABLET | Freq: Every day | ORAL | Status: DC

## 2013-08-07 MED ORDER — DIPHENHYDRAMINE HCL 12.5 MG/5ML PO ELIX
12.5000 mg | ORAL_SOLUTION | Freq: Four times a day (QID) | ORAL | Status: DC | PRN
  Filled 2013-08-07: qty 5

## 2013-08-07 MED ORDER — ONDANSETRON HCL 4 MG/2ML IJ SOLN
INTRAMUSCULAR | Status: DC | PRN
  Administered 2013-08-07: 4 mg via INTRAVENOUS

## 2013-08-07 MED ORDER — NALOXONE HCL 0.4 MG/ML IJ SOLN
0.4000 mg | INTRAMUSCULAR | Status: DC | PRN
  Filled 2013-08-07: qty 1

## 2013-08-07 MED ORDER — TRAZODONE HCL 50 MG PO TABS
50.0000 mg | ORAL_TABLET | Freq: Every evening | ORAL | Status: DC | PRN
  Administered 2013-08-07: 50 mg via ORAL
  Filled 2013-08-07 (×2): qty 1

## 2013-08-07 MED ORDER — MENTHOL 3 MG MT LOZG: 1.0000 | LOZENGE | OROMUCOSAL | Status: DC | PRN

## 2013-08-07 MED ORDER — VANCOMYCIN HCL IN DEXTROSE 1-5 GM/200ML-% IV SOLN
1000.0000 mg | Freq: Once | INTRAVENOUS | Status: AC
  Administered 2013-08-07: 1000 mg via INTRAVENOUS
  Filled 2013-08-07 (×2): qty 200

## 2013-08-07 MED ORDER — NEOSTIGMINE METHYLSULFATE 1 MG/ML IJ SOLN
INTRAMUSCULAR | Status: DC | PRN
  Administered 2013-08-07: 2 mg via INTRAVENOUS

## 2013-08-07 MED ORDER — PROPOFOL 10 MG/ML IV BOLUS
INTRAVENOUS | Status: DC | PRN
  Administered 2013-08-07: 20 mg via INTRAVENOUS
  Administered 2013-08-07: 130 mg via INTRAVENOUS

## 2013-08-07 MED ORDER — ROCURONIUM BROMIDE 100 MG/10ML IV SOLN
INTRAVENOUS | Status: DC | PRN
  Administered 2013-08-07: 50 mg via INTRAVENOUS

## 2013-08-07 MED ORDER — 0.9 % SODIUM CHLORIDE (POUR BTL) OPTIME
TOPICAL | Status: DC | PRN
  Administered 2013-08-07 (×3): 1000 mL

## 2013-08-07 MED ORDER — BUPIVACAINE-EPINEPHRINE PF 0.25-1:200000 % IJ SOLN
INTRAMUSCULAR | Status: AC
  Filled 2013-08-07: qty 30

## 2013-08-07 MED ORDER — LACTATED RINGERS IV SOLN
INTRAVENOUS | Status: DC | PRN
  Administered 2013-08-07 (×2): via INTRAVENOUS

## 2013-08-07 MED ORDER — ALPRAZOLAM 0.5 MG PO TABS
0.5000 mg | ORAL_TABLET | Freq: Two times a day (BID) | ORAL | Status: DC | PRN
  Administered 2013-08-08 – 2013-08-13 (×7): 0.5 mg via ORAL
  Filled 2013-08-07 (×8): qty 1

## 2013-08-07 MED ORDER — SODIUM CHLORIDE 0.9 % IV SOLN
INTRAVENOUS | Status: DC | PRN
  Administered 2013-08-07: 08:00:00 via INTRAVENOUS

## 2013-08-07 MED ORDER — ONDANSETRON HCL 4 MG PO TABS
4.0000 mg | ORAL_TABLET | Freq: Four times a day (QID) | ORAL | Status: DC
  Administered 2013-08-07 – 2013-08-14 (×26): 4 mg via ORAL
  Filled 2013-08-07 (×35): qty 1

## 2013-08-07 MED ORDER — MIDAZOLAM HCL 5 MG/5ML IJ SOLN
INTRAMUSCULAR | Status: DC | PRN
  Administered 2013-08-07: 2 mg via INTRAVENOUS

## 2013-08-07 MED ORDER — ONDANSETRON HCL 4 MG/2ML IJ SOLN: 4.0000 mg | INTRAMUSCULAR | Status: DC | PRN

## 2013-08-07 MED ORDER — PROPOFOL INFUSION 10 MG/ML OPTIME
INTRAVENOUS | Status: DC | PRN
  Administered 2013-08-07: 50 ug/kg/min via INTRAVENOUS

## 2013-08-07 MED ORDER — ARTIFICIAL TEARS OP OINT
TOPICAL_OINTMENT | OPHTHALMIC | Status: DC | PRN
  Administered 2013-08-07: 1 via OPHTHALMIC

## 2013-08-07 MED ORDER — DIAZEPAM 5 MG PO TABS
5.0000 mg | ORAL_TABLET | Freq: Four times a day (QID) | ORAL | Status: DC | PRN
  Administered 2013-08-07 – 2013-08-14 (×18): 5 mg via ORAL
  Filled 2013-08-07 (×17): qty 1

## 2013-08-07 MED ORDER — SODIUM CHLORIDE 0.9 % IJ SOLN: 9.0000 mL | INTRAMUSCULAR | Status: DC | PRN

## 2013-08-07 MED ORDER — SODIUM CHLORIDE 0.9 % IJ SOLN: 3.0000 mL | INTRAMUSCULAR | Status: DC | PRN

## 2013-08-07 MED ORDER — HYDROCHLOROTHIAZIDE 12.5 MG PO CAPS
12.5000 mg | ORAL_CAPSULE | Freq: Every day | ORAL | Status: DC
  Administered 2013-08-07 – 2013-08-14 (×8): 12.5 mg via ORAL
  Filled 2013-08-07 (×8): qty 1

## 2013-08-07 MED ORDER — VECURONIUM BROMIDE 10 MG IV SOLR
INTRAVENOUS | Status: DC | PRN
  Administered 2013-08-07: 1 mg via INTRAVENOUS

## 2013-08-07 MED ORDER — FENTANYL CITRATE 0.05 MG/ML IJ SOLN
INTRAMUSCULAR | Status: DC | PRN
  Administered 2013-08-07 (×4): 50 ug via INTRAVENOUS
  Administered 2013-08-07: 100 ug via INTRAVENOUS
  Administered 2013-08-07 (×2): 50 ug via INTRAVENOUS
  Administered 2013-08-07: 100 ug via INTRAVENOUS
  Administered 2013-08-07: 50 ug via INTRAVENOUS

## 2013-08-07 MED ORDER — PHENOL 1.4 % MT LIQD: 1.0000 | OROMUCOSAL | Status: DC | PRN

## 2013-08-07 MED ORDER — PHENYLEPHRINE HCL 10 MG/ML IJ SOLN
INTRAMUSCULAR | Status: DC | PRN
  Administered 2013-08-07 (×5): 40 ug via INTRAVENOUS

## 2013-08-07 MED ORDER — HYDROMORPHONE HCL PF 1 MG/ML IJ SOLN
0.5000 mg | INTRAMUSCULAR | Status: DC | PRN
  Administered 2013-08-08 – 2013-08-12 (×19): 1 mg via INTRAVENOUS
  Filled 2013-08-07 (×19): qty 1

## 2013-08-07 MED ORDER — INDIGOTINDISULFONATE SODIUM 8 MG/ML IJ SOLN
INTRAMUSCULAR | Status: DC | PRN
  Administered 2013-08-07: .5 mL

## 2013-08-07 MED ORDER — GLYCOPYRROLATE 0.2 MG/ML IJ SOLN
INTRAMUSCULAR | Status: DC | PRN
  Administered 2013-08-07: 0.2 mg via INTRAVENOUS

## 2013-08-07 MED ORDER — BISACODYL 5 MG PO TBEC: 5.0000 mg | DELAYED_RELEASE_TABLET | Freq: Every day | ORAL | Status: DC | PRN

## 2013-08-07 MED ORDER — ACETAMINOPHEN 650 MG RE SUPP: 650.0000 mg | RECTAL | Status: DC | PRN

## 2013-08-07 MED ORDER — ACETAMINOPHEN 325 MG PO TABS
650.0000 mg | ORAL_TABLET | ORAL | Status: DC | PRN
  Administered 2013-08-08: 650 mg via ORAL
  Filled 2013-08-07: qty 2

## 2013-08-07 MED ORDER — IPRATROPIUM-ALBUTEROL 20-100 MCG/ACT IN AERS
1.0000 | INHALATION_SPRAY | Freq: Four times a day (QID) | RESPIRATORY_TRACT | Status: DC
  Administered 2013-08-07 – 2013-08-08 (×2): 1 via RESPIRATORY_TRACT
  Filled 2013-08-07 (×2): qty 4

## 2013-08-07 MED ORDER — THROMBIN 20000 UNITS EX SOLR
CUTANEOUS | Status: AC
  Filled 2013-08-07: qty 40000

## 2013-08-07 MED ORDER — DIPHENHYDRAMINE HCL 50 MG/ML IJ SOLN
12.5000 mg | Freq: Four times a day (QID) | INTRAMUSCULAR | Status: DC | PRN
  Filled 2013-08-07: qty 0.25

## 2013-08-07 MED ORDER — LACTATED RINGERS IV SOLN
INTRAVENOUS | Status: DC | PRN
  Administered 2013-08-07: 07:00:00 via INTRAVENOUS

## 2013-08-07 MED ORDER — SENNOSIDES-DOCUSATE SODIUM 8.6-50 MG PO TABS: 1.0000 | ORAL_TABLET | Freq: Every evening | ORAL | Status: DC | PRN

## 2013-08-07 MED ORDER — SODIUM CHLORIDE 0.9 % IV SOLN: 250.0000 mL | INTRAVENOUS | Status: DC

## 2013-08-07 MED ORDER — METFORMIN HCL 500 MG PO TABS
500.0000 mg | ORAL_TABLET | Freq: Two times a day (BID) | ORAL | Status: DC
  Administered 2013-08-07 – 2013-08-14 (×14): 500 mg via ORAL
  Filled 2013-08-07 (×17): qty 1

## 2013-08-07 MED ORDER — LORATADINE 10 MG PO TABS
10.0000 mg | ORAL_TABLET | Freq: Every morning | ORAL | Status: DC
  Administered 2013-08-08 – 2013-08-14 (×7): 10 mg via ORAL
  Filled 2013-08-07 (×7): qty 1

## 2013-08-07 MED ORDER — ALUM & MAG HYDROXIDE-SIMETH 200-200-20 MG/5ML PO SUSP
30.0000 mL | Freq: Four times a day (QID) | ORAL | Status: DC | PRN
  Administered 2013-08-08: 30 mL via ORAL
  Filled 2013-08-07: qty 30

## 2013-08-07 MED ORDER — BUPIVACAINE-EPINEPHRINE 0.25% -1:200000 IJ SOLN
INTRAMUSCULAR | Status: DC | PRN
  Administered 2013-08-07: 4 mL

## 2013-08-07 MED ORDER — HYDROMORPHONE HCL PF 1 MG/ML IJ SOLN
0.2500 mg | INTRAMUSCULAR | Status: DC | PRN
  Administered 2013-08-07 (×4): 0.5 mg via INTRAVENOUS

## 2013-08-07 MED FILL — Sodium Chloride IV Soln 0.9%: INTRAVENOUS | Qty: 1000 | Status: AC

## 2013-08-07 MED FILL — Heparin Sodium (Porcine) Inj 1000 Unit/ML: INTRAMUSCULAR | Qty: 30 | Status: AC

## 2013-08-07 SURGICAL SUPPLY — 73 items
BENZOIN TINCTURE PRP APPL 2/3 (GAUZE/BANDAGES/DRESSINGS) ×2 IMPLANT
BLADE SURG ROTATE 9660 (MISCELLANEOUS) IMPLANT
BUR ROUND PRECISION 4.0 (BURR) ×2 IMPLANT
CAGE BULLET CONCORDE 9X9X27 (Cage) ×2 IMPLANT
CARTRIDGE OIL MAESTRO DRILL (MISCELLANEOUS) ×2 IMPLANT
CLOTH BEACON ORANGE TIMEOUT ST (SAFETY) ×2 IMPLANT
CONT SPEC STER OR (MISCELLANEOUS) ×2 IMPLANT
CORDS BIPOLAR (ELECTRODE) ×2 IMPLANT
COVER SURGICAL LIGHT HANDLE (MISCELLANEOUS) ×2 IMPLANT
DIFFUSER DRILL AIR PNEUMATIC (MISCELLANEOUS) ×4 IMPLANT
DRAIN CHANNEL 15F RND FF W/TCR (WOUND CARE) IMPLANT
DRAPE C-ARM 42X72 X-RAY (DRAPES) ×2 IMPLANT
DRAPE ORTHO SPLIT 77X108 STRL (DRAPES) ×1
DRAPE POUCH INSTRU U-SHP 10X18 (DRAPES) ×2 IMPLANT
DRAPE SURG 17X23 STRL (DRAPES) ×6 IMPLANT
DRAPE SURG ORHT 6 SPLT 77X108 (DRAPES) ×1 IMPLANT
DURAPREP 26ML APPLICATOR (WOUND CARE) ×2 IMPLANT
ELECT BLADE 4.0 EZ CLEAN MEGAD (MISCELLANEOUS)
ELECT CAUTERY BLADE 6.4 (BLADE) ×2 IMPLANT
ELECT REM PT RETURN 9FT ADLT (ELECTROSURGICAL) ×2
ELECTRODE BLDE 4.0 EZ CLN MEGD (MISCELLANEOUS) IMPLANT
ELECTRODE REM PT RTRN 9FT ADLT (ELECTROSURGICAL) ×1 IMPLANT
EVACUATOR SILICONE 100CC (DRAIN) IMPLANT
GAUZE SPONGE 4X4 16PLY XRAY LF (GAUZE/BANDAGES/DRESSINGS) ×8 IMPLANT
GLOVE BIO SURGEON STRL SZ7 (GLOVE) ×2 IMPLANT
GLOVE BIO SURGEON STRL SZ8 (GLOVE) ×2 IMPLANT
GLOVE BIOGEL PI IND STRL 7.5 (GLOVE) ×1 IMPLANT
GLOVE BIOGEL PI IND STRL 8 (GLOVE) ×1 IMPLANT
GLOVE BIOGEL PI INDICATOR 7.5 (GLOVE) ×1
GLOVE BIOGEL PI INDICATOR 8 (GLOVE) ×1
GOWN STRL NON-REIN LRG LVL3 (GOWN DISPOSABLE) ×4 IMPLANT
GOWN STRL REIN XL XLG (GOWN DISPOSABLE) ×2 IMPLANT
IV CATH 14GX2 1/4 (CATHETERS) ×2 IMPLANT
KIT BASIN OR (CUSTOM PROCEDURE TRAY) ×2 IMPLANT
KIT POSITION SURG JACKSON T1 (MISCELLANEOUS) ×2 IMPLANT
KIT ROOM TURNOVER OR (KITS) ×2 IMPLANT
MARKER SKIN DUAL TIP RULER LAB (MISCELLANEOUS) ×2 IMPLANT
MIX DBX 10CC 35% BONE (Bone Implant) ×2 IMPLANT
NEEDLE BONE MARROW 8GX6 FENEST (NEEDLE) ×2 IMPLANT
NEEDLE HYPO 25GX1X1/2 BEV (NEEDLE) ×2 IMPLANT
NEEDLE SPNL 18GX3.5 QUINCKE PK (NEEDLE) ×4 IMPLANT
NEURO MONITORING STIM (LABOR (TRAVEL & OVERTIME)) ×2 IMPLANT
NS IRRIG 1000ML POUR BTL (IV SOLUTION) ×2 IMPLANT
OIL CARTRIDGE MAESTRO DRILL (MISCELLANEOUS) ×4
PACK LAMINECTOMY ORTHO (CUSTOM PROCEDURE TRAY) ×2 IMPLANT
PACK UNIVERSAL I (CUSTOM PROCEDURE TRAY) ×2 IMPLANT
PAD ARMBOARD 7.5X6 YLW CONV (MISCELLANEOUS) ×4 IMPLANT
PATTIES SURGICAL .5 X1 (DISPOSABLE) ×2 IMPLANT
PATTIES SURGICAL .5X1.5 (GAUZE/BANDAGES/DRESSINGS) ×2 IMPLANT
ROD PRE LORDOSED 5.5X45 (Rod) ×2 IMPLANT
SCREW EXPEDIUM POLYAXIAL 7X40M (Screw) ×2 IMPLANT
SCREW POLYAXIAL 7X45MM (Screw) ×2 IMPLANT
SCREW SET SINGLE INNER (Screw) ×4 IMPLANT
SPONGE GAUZE 4X4 12PLY (GAUZE/BANDAGES/DRESSINGS) ×2 IMPLANT
SPONGE INTESTINAL PEANUT (DISPOSABLE) ×2 IMPLANT
SPONGE SURGIFOAM ABS GEL 100 (HEMOSTASIS) ×2 IMPLANT
STRIP CLOSURE SKIN 1/2X4 (GAUZE/BANDAGES/DRESSINGS) ×2 IMPLANT
SURGIFLO TRUKIT (HEMOSTASIS) IMPLANT
SUT MNCRL AB 4-0 PS2 18 (SUTURE) ×2 IMPLANT
SUT VIC AB 0 CT1 18XCR BRD 8 (SUTURE) ×1 IMPLANT
SUT VIC AB 0 CT1 8-18 (SUTURE) ×1
SUT VIC AB 1 CT1 18XCR BRD 8 (SUTURE) ×1 IMPLANT
SUT VIC AB 1 CT1 8-18 (SUTURE) ×1
SUT VIC AB 2-0 CT2 18 VCP726D (SUTURE) ×2 IMPLANT
SYR 20CC LL (SYRINGE) ×2 IMPLANT
SYR BULB IRRIGATION 50ML (SYRINGE) ×2 IMPLANT
SYR CONTROL 10ML LL (SYRINGE) ×2 IMPLANT
TAPE CLOTH SURG 4X10 WHT LF (GAUZE/BANDAGES/DRESSINGS) ×2 IMPLANT
TOWEL OR 17X24 6PK STRL BLUE (TOWEL DISPOSABLE) ×2 IMPLANT
TOWEL OR 17X26 10 PK STRL BLUE (TOWEL DISPOSABLE) ×2 IMPLANT
TRAY FOLEY CATH 16FRSI W/METER (SET/KITS/TRAYS/PACK) ×2 IMPLANT
WATER STERILE IRR 1000ML POUR (IV SOLUTION) ×2 IMPLANT
YANKAUER SUCT BULB TIP NO VENT (SUCTIONS) ×2 IMPLANT

## 2013-08-07 NOTE — Progress Notes (Signed)
Lunch relief by [employer]

## 2013-08-07 NOTE — Preoperative (Signed)
Beta Blockers   Reason not to administer Beta Blockers:Not Applicable 

## 2013-08-07 NOTE — Anesthesia Preprocedure Evaluation (Addendum)
Anesthesia Evaluation  Patient identified by MRN, date of birth, ID band Patient awake    Reviewed: Allergy & Precautions, H&P , NPO status , Patient's Chart, lab work & pertinent test results  History of Anesthesia Complications Negative for: history of anesthetic complications  Airway Mallampati: II TM Distance: >3 FB Neck ROM: Full    Dental  (+) Edentulous Upper, Edentulous Lower and Dental Advisory Given   Pulmonary COPD COPD inhaler,  breath sounds clear to auscultation        Cardiovascular hypertension, Pt. on medications Rhythm:Regular Rate:Normal     Neuro/Psych  Headaches, PSYCHIATRIC DISORDERS Anxiety Bipolar Disorder    GI/Hepatic hiatal hernia, GERD-  Medicated and Controlled,(+) Hepatitis -, B and C  Endo/Other  diabetes, Type 2, Oral Hypoglycemic Agents  Renal/GU   negative genitourinary   Musculoskeletal   Abdominal   Peds  Hematology   Anesthesia Other Findings   Reproductive/Obstetrics negative OB ROS                          Anesthesia Physical Anesthesia Plan  ASA: III  Anesthesia Plan: General   Post-op Pain Management:    Induction: Intravenous  Airway Management Planned: Oral ETT  Additional Equipment: CVP  Intra-op Plan:   Post-operative Plan: Extubation in OR  Informed Consent: I have reviewed the patients History and Physical, chart, labs and discussed the procedure including the risks, benefits and alternatives for the proposed anesthesia with the patient or authorized representative who has indicated his/her understanding and acceptance.     Plan Discussed with: CRNA and Anesthesiologist  Anesthesia Plan Comments:        Anesthesia Quick Evaluation

## 2013-08-07 NOTE — H&P (Signed)
PREOPERATIVE H&P  Chief Complaint: left leg pain  HPI: [name] [name] is a 62 y.o. female who presents with ongoing pain in left leg. MRI = left facet cyst at 5/1. Patient failed multiple forms of conservative care.  Past Medical History  Diagnosis Date  . Hypertension   . Diabetes mellitus   . Bipolar 1 disorder   . Heart murmur   . Sciatica   . Arthritis   . Anxiety   . Diverticulosis   . Chronic low back pain   . DDD (degenerative disc disease), lumbar   . Chronic shoulder pain   . Chronic neck pain   . Hepatitis B     Unclear when initially diagnosed, labs in Epic from 03/09/13  . Hepatitis C     Unclear when initially diagnosed, labs in Epic from 03/09/13  . C. difficile diarrhea     April and February 2014  . Adenocarcinoma of lung, stage 1   . COPD (chronic obstructive pulmonary disease)   . GERD (gastroesophageal reflux disease)   . H/O hiatal hernia   . ZOXWRUEA(540.9)     hx   Past Surgical History  Procedure Laterality Date  . Facial tumor removal Right 2000  . Brain tumor removal  09  . Brain surgery      in [place]  . Video bronchoscopy  12/04/2012    Procedure: VIDEO BRONCHOSCOPY;  Surgeon: Delight Ovens, MD;  Location: [facility] OR;  Service: Thoracic;  Laterality: N/A;  . Video assisted thoracoscopy (vats)/wedge resection  12/04/2012    Procedure: VIDEO ASSISTED THORACOSCOPY (VATS)/WEDGE RESECTION;  Surgeon: Delight Ovens, MD;  Location: [facility] OR;  Service: Thoracic;  Laterality: Right;  . Lobectomy  12/04/2012    Procedure: LOBECTOMY;  Surgeon: Delight Ovens, MD;  Location[facility] OR;  Service: Thoracic;  Laterality: Right;  completion of right upper lobectomy and lymph node disection, placement of on q pump  . Tubal ligation    . Tonsillectomy     History   Social History  . Marital Status: Widowed    Spouse Name: N/A    Number of Children: N/A  . Years of Education: N/A   Occupational History  . n/a     patient draws SNN/[employer]   Social  History Main Topics  . Smoking status: Former Smoker -- 0.25 packs/day for 10 years    Types: Cigarettes    Quit date: 07/07/2013  . Smokeless tobacco: Never Used     Comment: Smokes pk q 3 days   marajuna aug  . Alcohol Use: No     Comment: quit 62yrs ago  . Drug Use: Yes    Special: Hydrocodone, Marijuana     Comment: 3 weeks ago  . Sexual Activity: No   Other Topics Concern  . None   Social History Narrative  . None   Family History  Problem Relation Age of Onset  . Hypertension Father     deceased  . Diabetes Father   . Heart disease Father   . Hyperlipidemia Father   . Hypertension Mother   . Hyperlipidemia Mother   . Diabetes Mother   . Hypertension Sister   . Hyperlipidemia Sister   . Hypertension Brother   . Hyperlipidemia Brother    Allergies  Allergen Reactions  . Haldol [Haloperidol Decanoate] Other (See Comments)    tongue swelling  . Metronidazole Other (See Comments)    Severe stomach upset--was instructed to avoid Flagyl.  . Morphine And Related  Hives    hives  . Penicillins Rash   Prior to Admission medications   Medication Sig Start Date End Date Taking? Authorizing Provider  acetaminophen (TYLENOL) 500 MG tablet Take 1 tablet (500 mg total) by mouth every 6 (six) hours as needed for pain. 05/26/13  Yes Junius Finner, PA-C  ALPRAZolam Prudy Feeler) 1 MG tablet Take 0.5 mg by mouth 2 (two) times daily as needed for anxiety.    Yes Historical Provider, MD  aspirin EC 81 MG tablet Take 81 mg by mouth daily.   Yes Historical Provider, MD  dicyclomine (BENTYL) 20 MG tablet Take 1 tablet (20 mg total) by mouth 3 (three) times daily with meals. 05/31/13  Yes [name], MD  ergocalciferol (VITAMIN D2) 50000 UNITS capsule Take 50,000 Units by mouth every Monday.   Yes Historical Provider, MD  gabapentin (NEURONTIN) 400 MG capsule Take 400 mg by mouth 3 (three) times daily.    Yes Historical Provider, MD  HYDROcodone-acetaminophen (NORCO/VICODIN) 5-325 MG per  tablet Take 1-2 tablets by mouth every 6 (six) hours as needed for pain. 05/31/13  Yes Neema Davina Poke, MD  Ipratropium-Albuterol (COMBIVENT RESPIMAT) 20-100 MCG/ACT AERS respimat Inhale 1 puff into the lungs every 6 (six) hours.   Yes Historical Provider, MD  loratadine (CLARITIN) 10 MG tablet Take 10 mg by mouth every morning.    Yes Historical Provider, MD  metFORMIN (GLUCOPHAGE) 500 MG tablet Take 500 mg by mouth 2 (two) times daily with a meal.    Yes Historical Provider, MD  methocarbamol (ROBAXIN) 500 MG tablet Take 1 tablet (500 mg total) by mouth 2 (two) times daily. 03/01/13  Yes Derwood Kaplan, MD  olmesartan-hydrochlorothiazide (BENICAR HCT) 40-12.5 MG per tablet Take 1 tablet by mouth daily.   Yes Historical Provider, MD  Omega-3 Fatty Acids (FISH OIL PO) Take 1 capsule by mouth daily.   Yes Historical Provider, MD  ondansetron (ZOFRAN) 4 MG tablet Take 1 tablet (4 mg total) by mouth every 6 (six) hours. 03/07/13  Yes Fayrene Helper, PA-C  simvastatin (ZOCOR) 20 MG tablet Take 20 mg by mouth every evening.   Yes Historical Provider, MD  traZODone (DESYREL) 50 MG tablet Take 50 mg by mouth at bedtime as needed for sleep.   Yes Historical Provider, MD     All other systems have been reviewed and were otherwise negative with the exception of those mentioned in the HPI and as above.  Physical Exam: Filed Vitals:   08/07/13 0617  BP: 149/91  Pulse: 83  Temp: 97.5 F (36.4 C)  Resp: 20    General: Alert, no acute distress Cardiovascular: No pedal edema Respiratory: No cyanosis, no use of accessory musculature Skin: No lesions in the area of chief complaint Neurologic: Sensation intact distally Psychiatric: [name] competent for consent with normal mood and affect Lymphatic: No axillary or cervical lymphadenopathy  MUSCULOSKELETAL: + SLR on left  Assessment/Plan: Left leg pain with low back pain Plan for Procedure(s): POSTERIOR LUMBAR FUSION 1 LEVEL   Emilee Hero,  MD 08/07/2013 7:18 AM

## 2013-08-07 NOTE — Transfer of Care (Signed)
Immediate Anesthesia Transfer of Care Note  Patient: [name] [name]  Procedure(s) Performed: Procedure(s) with comments: POSTERIOR LUMBAR FUSION 1 LEVEL (Left) - Left sided lumbar 5-sacrum 1 Transforaminal lumbar interbody fusion with instrumentation, vitoss, bone marrow aspirate  Patient Location: PACU  Anesthesia Type:General  Level of Consciousness: awake, alert , oriented and patient cooperative  Airway & Oxygen Therapy: Patient Spontanous Breathing and Patient connected to nasal cannula oxygen  Post-op Assessment: Report given to PACU RN and Post -op Vital signs reviewed and stable  Post vital signs: Reviewed and stable  Complications: No apparent anesthesia complications

## 2013-08-07 NOTE — Progress Notes (Signed)
ANTIBIOTIC CONSULT NOTE - INITIAL  Pharmacy Consult for Vancomycin Indication: Surgical prophylaxis.  Allergies  Allergen Reactions  . Haldol [Haloperidol Decanoate] Other (See Comments)    tongue swelling  . Metronidazole Other (See Comments)    Severe stomach upset--was instructed to avoid Flagyl.  . Morphine And Related Hives    hives  . Penicillins Rash    Patient Measurements:   Body Weight: 67.3 kg  Vital Signs: Temp: 99 F (37.2 C) (09/18 1454) Temp src: Oral (09/18 0617) BP: 128/68 mmHg (09/18 1454) Pulse Rate: 97 (09/18 1454) Intake/Output from previous day:   Intake/Output from this shift: Total I/O In: 2650 [P.O.:200; I.V.:2450] Out: 1325 [Urine:1225; Blood:100]  Labs: No results found for this basename: WBC, HGB, PLT, LABCREA, CREATININE,  in the last 72 hours The CrCl is unknown because both a height and weight (above a minimum accepted value) are required for this calculation. No results found for this basename: VANCOTROUGH, Leodis Binet, VANCORANDOM, GENTTROUGH, GENTPEAK, GENTRANDOM, TOBRATROUGH, TOBRAPEAK, TOBRARND, AMIKACINPEAK, AMIKACINTROU, AMIKACIN,  in the last 72 hours   Microbiology: Recent Results (from the past 720 hour(s))  SURGICAL PCR SCREEN     Status: None   Collection Time    07/09/13 12:24 PM      Result Value Range Status   MRSA, PCR NEGATIVE  NEGATIVE Final   Staphylococcus aureus NEGATIVE  NEGATIVE Final   Comment:            The Xpert SA Assay (FDA     approved for NASAL specimens     in patients over 28 years of age),     is one component of     a comprehensive surveillance     program.  Test performance has     been validated by [employer] for patients greater     than or equal to 64 year old.     It is not intended     to diagnose infection nor to     guide or monitor treatment.  SURGICAL PCR SCREEN     Status: None   Collection Time    08/01/13 10:49 AM      Result Value Range Status   MRSA, PCR NEGATIVE   NEGATIVE Final   Staphylococcus aureus NEGATIVE  NEGATIVE Final   Comment:            The Xpert SA Assay (FDA     approved for NASAL specimens     in patients over 31 years of age),     is one component of     a comprehensive surveillance     program.  Test performance has     been validated by [employer] for patients greater     than or equal to 64 year old.     It is not intended     to diagnose infection nor to     guide or monitor treatment.    Medical History: Past Medical History  Diagnosis Date  . Hypertension   . Diabetes mellitus   . Bipolar 1 disorder   . Heart murmur   . Sciatica   . Arthritis   . Anxiety   . Diverticulosis   . Chronic low back pain   . DDD (degenerative disc disease), lumbar   . Chronic shoulder pain   . Chronic neck pain   . Hepatitis B     Unclear when initially diagnosed, labs in Epic from 03/09/13  .  Hepatitis C     Unclear when initially diagnosed, labs in Epic from 03/09/13  . C. difficile diarrhea     April and February 2014  . Adenocarcinoma of lung, stage 1   . COPD (chronic obstructive pulmonary disease)   . GERD (gastroesophageal reflux disease)   . H/O hiatal hernia   . Headache(784.0)     hx    Assessment: 62 YOF s/p posterior lumbar fusion today, pharmacy is consulted to dose vancomycin for surgical prophylaxis since pt. Has penicillin allergy. 1 dose of vancomycin 1g was given before surgery at 0730. Scr 0.67 on 9/12, est. crcl ~90 ml/min. No drain, confirmed with RN.   Goal of Therapy:  Prevent surgical infections  Plan:  - Vancomycin 1g IV x1 at 2000 tonight - Pharmacy sign off - Please re-consult if other antibiotics are needed.   Bayard Hugger, PharmD, BCPS  Clinical Pharmacist  Pager: [phone]   08/07/2013,3:25 PM

## 2013-08-07 NOTE — Anesthesia Procedure Notes (Signed)
Procedure Name: Intubation Date/Time: 08/07/2013 7:52 AM Performed by: Lovie Chol Pre-anesthesia Checklist: Patient identified, Emergency Drugs available, Suction available, Patient being monitored and Timeout performed Patient Re-evaluated:Patient Re-evaluated prior to inductionOxygen Delivery Method: Circle system utilized Preoxygenation: Pre-oxygenation with 100% oxygen Intubation Type: IV induction Ventilation: Mask ventilation without difficulty and Oral airway inserted - appropriate to patient size Laryngoscope Size: Miller and 2 Grade View: Grade I Tube type: Oral Tube size: 7.0 mm Number of attempts: 1 Airway Equipment and Method: Stylet Placement Confirmation: ETT inserted through vocal cords under direct vision,  positive ETCO2,  CO2 detector and breath sounds checked- equal and bilateral Secured at: 21 cm Tube secured with: Tape Dental Injury: Teeth and Oropharynx as per pre-operative assessment

## 2013-08-07 NOTE — Anesthesia Postprocedure Evaluation (Signed)
  Anesthesia Post-op Note  Patient: [name] [name]  Procedure(s) Performed: Procedure(s) with comments: POSTERIOR LUMBAR FUSION 1 LEVEL (Left) - Left sided lumbar 5-sacrum 1 Transforaminal lumbar interbody fusion with instrumentation, vitoss, bone marrow aspirate  Patient Location: PACU  Anesthesia Type:General  Level of Consciousness: awake, alert  and oriented  Airway and Oxygen Therapy: Patient Spontanous Breathing  Post-op Pain: mild  Post-op Assessment: Post-op Vital signs reviewed, Patient's Cardiovascular Status Stable, Respiratory Function Stable, Patent Airway and Pain level controlled  Post-op Vital Signs: stable  Complications: No apparent anesthesia complications

## 2013-08-08 LAB — GLUCOSE, CAPILLARY: Glucose-Capillary: 110 mg/dL — ABNORMAL HIGH (ref 70–99)

## 2013-08-08 MED ORDER — SODIUM CHLORIDE 0.9 % IJ SOLN
10.0000 mL | INTRAMUSCULAR | Status: DC | PRN
  Administered 2013-08-08 – 2013-08-12 (×8): 10 mL

## 2013-08-08 MED ORDER — SODIUM CHLORIDE 0.9 % IJ SOLN
10.0000 mL | Freq: Two times a day (BID) | INTRAMUSCULAR | Status: DC
  Administered 2013-08-10: 10 mL

## 2013-08-08 NOTE — Progress Notes (Signed)
Chaplain responded to consult from nurse. Patient was friendly and talkative. She stated that she had been struggled with anger because of all the pain she was experiencing. Patient said that it was hard for her to not take her anger out on the staff. She talked about her other surgeries before this, her home situation with her daughter and grandkids, and her faith. She explained that she wanted to go to rehab before going home to live with her daughter again because her daughter is working and in school and won't have the resources to take care of her like she needs. Chaplain practiced active listening, counseling through a major life change, and prayer. Patient expressed thanks for visit.

## 2013-08-08 NOTE — Evaluation (Signed)
Physical Therapy Evaluation Patient Details Name: [name] [name] MRN: [id number] DOB: [birth year] Today's Date: 08/08/2013 Time: 7829-5621 PT Time Calculation (min): 21 min  PT Assessment / Plan / Recommendation History of Present Illness  L5-S1 fusion  Clinical Impression  [name] s/p L4-L5 fusion surgery resulting in the deficits listed below (see PT Problem List).  Patient will benefit from skilled PT to increase their independence and safety with mobility (while adhering to their precautions). Pt was greatly limited by pain and the TLSO leg component. Pt would be unable to manage brace at home alone. Would benefit from CIR to increase independence prior to returning home with daughter.      PT Assessment  Patient needs continued PT services    Follow Up Recommendations  CIR    Does the patient have the potential to tolerate intense rehabilitation      Barriers to Discharge Inaccessible home environment;Decreased caregiver support pt will be alone ~8 hours a day while daughter is at work/school    Equipment Recommendations  Other (comment) (TBD)    Recommendations for Other Services Rehab consult   Frequency Min 5X/week    Precautions / Restrictions Precautions Precautions: Fall;Back Precaution Booklet Issued: Yes (comment) Precaution Comments: pt issued back precautions handout and reviewed  Required Braces or Orthoses: Spinal Brace Spinal Brace: Thoracolumbosacral orthotic;Applied in sitting position;Other (comment) (With Lt LE component ) Restrictions Weight Bearing Restrictions: No   Pertinent Vitals/Pain 10/10 pt premedicated. patient repositioned for comfort       Mobility  Bed Mobility Bed Mobility: Rolling Left;Left Sidelying to Sit;Sitting - Scoot to [place] of Bed Rolling Left: 4: Min assist;With rail Left Sidelying to Sit: 3: Mod assist;With rails;HOB flat Sitting - Scoot to Edge of Bed: 4: Min guard Details for Bed Mobility Assistance: cues for log  rolling technique and required facilitation and (A) to perform log rolling technique; (A) to bring LEs and trunk to upright sitting position Transfers Transfers: Sit to Stand;Stand to Sit Sit to Stand: 1: +2 Total assist;With upper extremity assist;From bed Sit to Stand: Patient Percentage: 50% Stand to Sit: 1: +2 Total assist;With upper extremity assist;With armrests;To chair/3-in-1 Stand to Sit: Patient Percentage: 60% Details for Transfer Assistance: VCs for safe hand placement and technique; 2+ to acheive upright standing position due to pain  Ambulation/Gait Ambulation/Gait Assistance: 3: Mod assist Ambulation Distance (Feet): 12 Feet Assistive device: Rolling walker Ambulation/Gait Assistance Details: cues for safety and sequencing with RW; pt required mod (A) around waist to maintain balance and (A) to manage RW Gait Pattern: Wide base of support;Trunk flexed;Decreased stride length (Lt LE was ER ) Gait velocity: decreased Stairs: No Wheelchair Mobility Wheelchair Mobility: No         PT Diagnosis: Difficulty walking;Acute pain  PT Problem List: Decreased activity tolerance;Decreased balance;Decreased mobility;Decreased cognition;Decreased knowledge of use of DME;Decreased safety awareness;Decreased knowledge of precautions;Pain PT Treatment Interventions: DME instruction;Gait training;Functional mobility training;Therapeutic activities;Balance training;Therapeutic exercise;Neuromuscular re-education;Patient/family education     PT Goals(Current goals can be found in the care plan section) Acute Rehab PT Goals Patient Stated Goal: To go to rehab first PT Goal Formulation: With patient Time For Goal Achievement: 08/15/13 Potential to Achieve Goals: Good  Visit Information  Last PT Received On: 08/08/13 Assistance Needed: +2 History of Present Illness: L5-S1 fusion       Prior Functioning  Home Living Family/patient expects to be discharged to:: Private  residence Living Arrangements: Children Available Help at Discharge: Family;Available PRN/intermittently Type of Home: House Home Access: Stairs  to enter Entrance Stairs-Number of Steps: 5 Entrance Stairs-Rails: Right;Left;Can reach both Home Layout: One level Home Equipment: None Prior Function Level of Independence: Independent Communication Communication: No difficulties Dominant Hand: Right    Cognition  Cognition Arousal/Alertness: Awake/alert Behavior During Therapy: WFL for tasks assessed/performed Overall Cognitive Status: Within Functional Limits for tasks assessed Memory: Decreased recall of precautions    Extremity/Trunk Assessment Upper Extremity Assessment Upper Extremity Assessment: Defer to OT evaluation Lower Extremity Assessment Lower Extremity Assessment: Overall WFL for tasks assessed Cervical / Trunk Assessment Cervical / Trunk Assessment: Normal   Balance Balance Balance Assessed: Yes Static Sitting Balance Static Sitting - Balance Support: Bilateral upper extremity supported;Feet supported Static Sitting - Level of Assistance: 5: Stand by assistance Static Sitting - Comment/# of Minutes: pt with lateral lean to Lt and required max encouragement to come to midline; tolerated sitting EOB ~7 min   End of Session PT - End of Session Equipment Utilized During Treatment: Back brace;Gait belt Activity Tolerance: Patient limited by pain Patient left: in chair;with call bell/phone within reach Nurse Communication: Mobility status  GP     Donell Sievert, [place] [phone] 08/08/2013, 2:56 PM

## 2013-08-08 NOTE — Progress Notes (Signed)
Rehab Admissions Coordinator Note:  Patient was screened by Trish Mage for appropriateness for an Inpatient Acute Rehab Consult.  Noted PT/OT recommending CIR.  At this time, we are recommending Inpatient Rehab consult.  Trish Mage 08/08/2013, 2:26 PM  I can be reached at [phone].

## 2013-08-08 NOTE — Progress Notes (Signed)
Pt c/o px, PCA not benefiting her. LBP and tightness. Leg px minimal, cramping and tightness in muscles. Eager to get up with PT. C/O bulky dressing in back from reinforcement by nursing overnight from bleeding. Denies numbness, denies urinary or bowel incontinence.  BP 151/67  Pulse 101  Temp(Src) 102.7 F (39.3 C) (Oral)  Resp 18  SpO2 99%  Pt laying in hospital bed, SCD's in place, lg bulky dressing on top of original removed. No active bleeding apparent. Dressing reinforced. NVI. - Homans BIL  1 Day PO L L5-S1 TLIF  -D/C PCA  -Start percocet and Valium  -Up with PT/OT today    -TLSO brace and back precautions at all times when OOB  -D/C pending px control and PT

## 2013-08-08 NOTE — Progress Notes (Signed)
Utilization review completed.  

## 2013-08-08 NOTE — Evaluation (Signed)
Occupational Therapy Evaluation Patient Details Name: [name] [name] MRN: [id number] DOB: 1951/09/20 Today's Date: 08/08/2013 Time: 7829-5621 OT Time Calculation (min): 20 min  OT Assessment / Plan / Recommendation History of present illness L5-S1 fusion   Clinical Impression   This 62 yo female s/p above presents to acute OT with problems below. Pt's current limitations are pain and the TLSO with leg component that is pretty impossible to manage alone (may need to discuss with Dr. Yevette Edwards if there is another brace that could be used due to lack of support at home and pt needing to be as independent as possible). Would benefit from acute OT with follow up on CIR.    OT Assessment  Patient needs continued OT Services    Follow Up Recommendations  CIR    Barriers to Discharge Decreased caregiver support    Equipment Recommendations  3 in 1 bedside comode    Recommendations for Other Services Rehab consult  Frequency  Min 2X/week    Precautions / Restrictions Precautions Precautions: Fall;Back Required Braces or Orthoses: Spinal Brace Spinal Brace: Thoracolumbosacral orthotic;Applied in sitting position (with leg component) Restrictions Weight Bearing Restrictions: No   Pertinent Vitals/Pain 10/10 back; pre-medicated.    ADL  Equipment Used: Back brace;Rolling walker Transfers/Ambulation Related to ADLs: total A +2 (pt=50%) sit >stand; (60%) stand>sit; mod A ambulation with RW (to control RW and cues to stay inside and with it) ADL Comments: Currently pt is total A for all BADLs in any position due to back precautions, brace, and spasms/cramping when sitting up. Total A to don TLSO with leg component sitting EOB.    OT Diagnosis: Generalized weakness;Acute pain  OT Problem List: Decreased activity tolerance;Impaired balance (sitting and/or standing);Decreased knowledge of use of DME or AE;Decreased knowledge of precautions;Pain OT Treatment Interventions: Self-care/ADL  training;Balance training;DME and/or AE instruction;Patient/family education   OT Goals(Current goals can be found in the care plan section) Acute Rehab OT Goals Patient Stated Goal: To go to rehab first OT Goal Formulation: With patient Time For Goal Achievement: 08/22/13 Potential to Achieve Goals: Good  Visit Information  Last OT Received On: 08/08/13 Assistance Needed: +2 PT/OT Co-Evaluation/Treatment: Yes History of Present Illness: L5-S1 fusion       Prior Functioning     Home Living Family/patient expects to be discharged to:: Private residence Living Arrangements: Children Available Help at Discharge: Family;Available PRN/intermittently Type of Home: House Home Access: Stairs to enter [employer] of Steps: 5 Entrance Stairs-Rails: Right;Left;Can reach both Home Layout: One level Home Equipment: None Prior Function Level of Independence: Independent Communication Communication: No difficulties Dominant Hand: Right         Vision/Perception Vision - History Patient Visual Report: No change from baseline   Cognition  Cognition Arousal/Alertness: Awake/alert Behavior During Therapy: WFL for tasks assessed/performed Overall Cognitive Status: Within Functional Limits for tasks assessed Memory: Decreased recall of precautions    Extremity/Trunk Assessment Upper Extremity Assessment Upper Extremity Assessment: Overall WFL for tasks assessed     Mobility Bed Mobility Bed Mobility: Rolling Left;Left Sidelying to Sit;Sitting - Scoot to [place] of Bed Rolling Left: 4: Min assist;With rail Left Sidelying to Sit: 3: Mod assist;With rails;HOB flat Sitting - Scoot to Edge of Bed: 4: Min guard Transfers Transfers: Sit to Stand;Stand to Sit Sit to Stand: 1: +2 Total assist;With upper extremity assist;From bed Sit to Stand: Patient Percentage: 50% Stand to Sit: 1: +2 Total assist;With upper extremity assist;With armrests;To chair/3-in-1 Stand to Sit:  Patient Percentage: 60% Details for  Transfer Assistance: VCs for safe hand placement           End of Session OT - End of Session Equipment Utilized During Treatment: Rolling walker;Back brace Activity Tolerance: Patient limited by pain Patient left: in chair;with call bell/phone within reach Nurse Communication: Mobility status       Evette Georges [phone] 08/08/2013, 1:59 PM

## 2013-08-08 NOTE — Op Note (Signed)
NAMEIVIONA, [name] [name]             ACCOUNT NO.:  [id number]  MEDICAL RECORD NO.:  [id number]  LOCATION:  5N09C                        FACILITY:  [facility]  PHYSICIAN:  Estill Bamberg, MD      DATE OF BIRTH:  27-Nov-1950  DATE OF PROCEDURE:  08/07/2013                              OPERATIVE REPORT   PREOPERATIVE DIAGNOSES: 1. Left-sided S1 radiculopathy. 2. Left-sided L5-S1 stenosis. 3. Grade 1 L5-S1 spondylolisthesis. 4. L5-S1 degenerative disk disease.  POSTOPERATIVE DIAGNOSES: 1. Left-sided S1 radiculopathy. 2. Left-sided L5-S1 stenosis. 3. Grade 1 L5-S1 spondylolisthesis. 4. L5-S1 degenerative disk disease.  PROCEDURES: 1. Left-sided L5-S1 transforaminal lumbar interbody fusion. 2. Right-sided L5-S1 posterolateral fusion. 3. Placement of posterior instrumentation L5-S1 (7 x 40 mm screws at     S1 and 7 x 45 mm screws at L5). 4. Insertion of interbody device x1 (CONCORDE bullet cage, 9 x 9 x 27     mm). 5. Use of local autograft. 6. Use of morselized allograft. 7. Intraoperative use of fluoroscopy.  SURGEON:  Estill Bamberg, MD  ASSISTANT:  Jason Coop, [facility]  ANESTHESIA:  General endotracheal anesthesia.  COMPLICATIONS:  None.  DISPOSITION:  Stable.  ESTIMATED BLOOD LOSS:  100 mL.  INDICATIONS FOR SURGERY:  Briefly, [name] [name] is a pleasant 62 year old female, who I did evaluate in my office initially on Mar 22, 2013.  At that time, the patient did have significant pain in her left leg and into her low back.  At that point, she did have a 53-month history of pain.  The pain was in the distribution of the S1 nerve.  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  We did go forward with conservative treatment measures including physical therapy, and epidural injections.  She did return back to see me on June 06, 2013, and did continue to have ongoing and  significant pain.  We therefore did discuss proceeding with surgery.  Given her instability and the findings on the MRI, we did specifically discuss proceeding with an L5-S1 transforaminal fusion with the contralateral fusion with decompression.  The patient did fully understand the risks and limitations of the procedure as outlined in my preoperative note.  OPERATIVE DETAILS:  On August 07, 2013; the patient was brought to surgery and general endotracheal anesthesia was administered.  The patient was placed prone on a well-padded flat Jackson bed with a spinal frame.  Antibiotics were given and a time-out procedure was performed. The back was then prepped and draped in the usual sterile fashion.  I then made a midline incision overlying the L5-S1 interspace.  The lamina of L5 and S1 were subperiosteally exposed on the right and the left sides.  On the right side, the posterolateral gutter was exposed as well and packed using Ray-Tecs and thrombin.  On the left side, the facet joint and posterolateral gutter was subperiosteally exposed.  Then on the left, I did cannulate the L5 and S1 pedicles using anatomical landmarks in addition to a 4-mm high-speed bur, followed by a curved gearshift probe, followed by a 6-mm tap.  A ball-tip probe did confirm that there was no cortical violation of the screws.  Bone wax was placed in the cannulated screw holes on the left side.  At this point, I did proceed with performing a complete facetectomy on the left.  The exiting L5 nerve and the traversing S1 nerve was readily identified.  With an assistant holding medial retraction of the traversing S1 nerve, I did use a 15-blade knife to perform an annulotomy.  I then used a series of curettes to perform a thorough and complete diskectomy to the anterior anulus.  The entire disk was removed in its entirety.  I then prepared the endplates and placed a series of trials and I did feel that a 10-mm trial  would be the most appropriate fit.  At this point, a 10 mL of DBX mix was combined with autograft, obtained from removing the facet joint on the left side.  The DBX autograft mixture was packed into the intervertebral space liberally.  I then advanced a 10-mm interbody implant into the intervertebral space.  I was extremely pleased with the press fit of the implant.  I did liberally use AP and lateral fluoroscopy while placing the implant and was very pleased with the construct.  At this point, I placed a 7 x 45 mm screw on the left at L5, and a 7 x 40 mm screw on the left at S1.  A 45-mm rod was then secured into the L5 and S1 screws.  Caps were placed and a final locking procedure was then performed.  Of note, I did liberally irrigate the wound with 1 L normal saline prior to placing the interbody bone graft. At this point, I turned my attention towards the patient's right side. The transverse process of L5 on the right and the sacral ala of S1 on the right was subperiosteally exposed, and a 4-mm high-speed bur was used to decorticate the posterior elements and to decorticate the transverse process and sacral ala, as well as the L5-S1 facet joint. The remainder of the bone graft was packed into the posterolateral gutter and along the posterior elements including the facet joint.  I was very pleased with the final AP and lateral fluoroscopic images.  I then closed the fascia using #1 Vicryl.  The subcutaneous layer was then closed using 2-0 Vicryl and the skin was then closed using 3-0 Monocryl. Benzoin and Steri-Strips were applied followed by sterile dressing.  All instrument counts were correct at the termination of the procedure.  Of note, Jason Coop was my assistant throughout the entirety of the procedure, and did aide in essential retraction and suctioning required throughout the entirety of the surgery.     Estill Bamberg, MD    MD/MEDQ  D:  08/07/2013  T:  08/08/2013   Job:  [id number]  cc:   Lerry Liner, MD

## 2013-08-08 NOTE — Progress Notes (Signed)
Patient reports moderate LBP. No leg pain.  BP 151/67  Pulse 101  Temp(Src) 102.7 F (39.3 C) (Oral)  Resp 18  SpO2 99%  NVI Dressing intact but was changed  POD #1 after 5/1 decompression and fusion  - up with PT today - d/c PCA - back precautions at all times

## 2013-08-09 LAB — GLUCOSE, CAPILLARY
Glucose-Capillary: 131 mg/dL — ABNORMAL HIGH (ref 70–99)
Glucose-Capillary: 130 mg/dL — ABNORMAL HIGH (ref 70–99)

## 2013-08-09 MED ORDER — IPRATROPIUM-ALBUTEROL 20-100 MCG/ACT IN AERS
1.0000 | INHALATION_SPRAY | Freq: Two times a day (BID) | RESPIRATORY_TRACT | Status: DC
  Administered 2013-08-09 – 2013-08-14 (×7): 1 via RESPIRATORY_TRACT
  Filled 2013-08-09: qty 4

## 2013-08-09 MED ORDER — WHITE PETROLATUM GEL
Status: AC
  Administered 2013-08-09: 0.2
  Filled 2013-08-09: qty 5

## 2013-08-09 MED ORDER — ALBUTEROL SULFATE (5 MG/ML) 0.5% IN NEBU: 2.5000 mg | INHALATION_SOLUTION | Freq: Four times a day (QID) | RESPIRATORY_TRACT | Status: DC | PRN

## 2013-08-09 NOTE — Progress Notes (Signed)
Occupational Therapy Treatment Patient Details Name: [name] [name] MRN: [id number] DOB: 08/18/1951 Today's Date: 08/09/2013 Time: 4098-1191 OT Time Calculation (min): 30 min  OT Assessment / Plan / Recommendation  History of present illness L5-S1 fusion   OT comments  Pt is progressing toward goals. Continue to recommend CIR as pt still demonstrates decreased independence with ADLs and needs to be mod I in order to safely return home since her daughter is unable to provide 24/7 supervision/assist.  Follow Up Recommendations  CIR    Barriers to Discharge       Equipment Recommendations  3 in 1 bedside comode    Recommendations for Other Services Rehab consult  Frequency Min 2X/week   Progress towards OT Goals Progress towards OT goals: Progressing toward goals  Plan Discharge plan remains appropriate    Precautions / Restrictions Precautions Precautions: Fall;Back Precaution Booklet Issued: Yes (comment) Precaution Comments: Pt unable to recall back precautions. Reviewed 3/3 back precautions, Required Braces or Orthoses: Spinal Brace Spinal Brace: Thoracolumbosacral orthotic;Applied in sitting position;Other (comment) (with leg component)   Pertinent Vitals/Pain See vitals    ADL  Eating/Feeding: Performed;Independent Where Assessed - Eating/Feeding: Chair Upper Body Dressing: Performed;Maximal assistance Where Assessed - Upper Body Dressing: Unsupported sitting Lower Body Dressing: Performed;+1 Total assistance Where Assessed - Lower Body Dressing: Unsupported sitting Toilet Transfer: Min guard;Simulated Toilet Transfer Method: Sit to Barista:  (bed) Toileting - Clothing Manipulation and Hygiene: Moderate assistance;Simulated Where Assessed - Toileting Clothing Manipulation and Hygiene: Sit to stand from 3-in-1 or toilet Equipment Used: Back brace;Rolling walker Transfers/Ambulation Related to ADLs: Min guard with RW ambulating in room and  hallway ADL Comments: On OT arrival, pt on bedside commode and initially refusing therapy but then stating several minutes later that she might as well since her appetite was ruined. When OT arrived second time, pt was standing at bedside waiting for therapist.  Pt not wearing TLSO on OT arrival and required max assist to donn back brace with leg component.  Pt stating several times throughout session that she cannot manage at home for herself while her daughter is at school and work and that she does not want to go home until she is ready.  Attempted to begin LB ADL education and techinques to use but pt fatigued and stating she was done and wanted to eat.    OT Diagnosis:    OT Problem List:   OT Treatment Interventions:     OT Goals(current goals can now be found in the care plan section) Acute Rehab OT Goals Patient Stated Goal: To go to rehab first OT Goal Formulation: With patient Time For Goal Achievement: 08/22/13 Potential to Achieve Goals: Good ADL Goals Pt Will Perform Grooming: with set-up;with supervision;standing Pt Will Transfer to Toilet: with min guard assist;ambulating;bedside commode Additional ADL Goal #1: Pt will be able to roll right and left without rail and HOB flat with min guard A to A with BADLs Additional ADL Goal #2: Pt will be able to come up to sit from right or left sidelying with min guard A in prep for transfers Additional ADL Goal #3: Pt will be able to verbalize and follow back precautions Additional ADL Goal #4: Pt will be mod A to don brace at EOB  Visit Information  Last OT Received On: 08/09/13 Assistance Needed: +1 History of Present Illness: L5-S1 fusion    Subjective Data      Prior Functioning       Cognition  Cognition  Arousal/Alertness: Awake/alert Behavior During Therapy: WFL for tasks assessed/performed Overall Cognitive Status: Within Functional Limits for tasks assessed Memory: Decreased recall of precautions    Mobility  Bed  Mobility Bed Mobility: Not assessed Transfers Transfers: Sit to Stand;Stand to Sit Stand to Sit: 4: Min guard;To chair/3-in-1;With upper extremity assist Details for Transfer Assistance: VCs for safe hand placement    Exercises      Balance     End of Session OT - End of Session Equipment Utilized During Treatment: Gait belt;Rolling walker;Back brace Activity Tolerance: Patient limited by fatigue Patient left: in chair;with call bell/phone within reach Nurse Communication: Mobility status  GO    08/09/2013 Cipriano Mile OTR/L Pager [phone] Office [phone]  Cipriano Mile 08/09/2013, 9:19 AM

## 2013-08-09 NOTE — Progress Notes (Signed)
PATIENT ID: [name] [name]  MRN: [id number]  DOB/AGE:  1951/09/12 / 62 y.o.  2 Days Post-Op Procedure(s) (LRB): POSTERIOR LUMBAR FUSION 1 LEVEL (Left)    PROGRESS NOTE Subjective:   [name] alert, oriented, yes Nausea, no Vomiting, yes passing gas, no Bowel Movement. Taking PO, pt taking sips. Denies SOB, Chest or Calf Pain. Using Incentive Spirometer, PAS in place. Ambulate WBAT, Patient reports pain as moderate,     Objective: Vital signs in last 24 hours: Temp:  [98.8 F (37.1 C)-101.3 F (38.5 C)] 98.8 F (37.1 C) (09/19 2138) Pulse Rate:  [110-123] 110 (09/19 2138) Resp:  [18-19] 19 (09/19 2138) BP: (134)/(65-71) 134/71 mmHg (09/19 2138) SpO2:  [90 %-99 %] 97 % (09/19 2138)    Intake/Output from previous day: I/O last 3 completed shifts: In: 1000 [P.O.:400; I.V.:600] Out: 900 [Urine:900]   Intake/Output this shift:     LABORATORY DATA:  Recent Labs  08/08/13 1610 08/08/13 2136 08/09/13 0624  GLUCAP 130* 110* 95    Examination: Neurologically intact Neurovascular intact Intact pulses distally Dorsiflexion/Plantar flexion intact}  Assessment:   2 Days Post-Op Procedure(s) (LRB): POSTERIOR LUMBAR FUSION 1 LEVEL (Left) ADDITIONAL DIAGNOSIS:  Diabetes, Hypertension and hepatitis b and c  Plan:  Weight Bearing as Tolerated (WBAT)  DVT Prophylaxis:  Foot Pumps  DISCHARGE PLAN: Inpatient Rehab pending approval.  DISCHARGE NEEDS: Walker and 3-in-1 comode seat  Pt is to get up with PT today, she is to wear TLSO brace with back precautions at all times.     [name] R 08/09/2013, 8:29 AM

## 2013-08-09 NOTE — Progress Notes (Signed)
Physical Therapy Treatment Patient Details Name: [name] [name] MRN: [id number] DOB: Aug 26, 1951 Today's Date: 08/09/2013 Time: 0805-0829 PT Time Calculation (min): 24 min  PT Assessment / Plan / Recommendation  History of Present Illness L5-S1 fusion   PT Comments   Pt progressing with PT goals & mobility at this date.  However cont to recommend CIR as pt cont's to demonstrate decreased independence with mobility but feel as though pt could achieve mod I/independent level to ensure safe d/c home   Follow Up Recommendations  CIR     Does the patient have the potential to tolerate intense rehabilitation     Barriers to Discharge        Equipment Recommendations  Other (comment) (TBD)    Recommendations for Other Services Rehab consult  Frequency Min 5X/week   Progress towards PT Goals Progress towards PT goals: Progressing toward goals  Plan Current plan remains appropriate    Precautions / Restrictions Precautions Precautions: Fall;Back Precaution Booklet Issued: Yes (comment) Precaution Comments: Pt unable to recall back precautions. Reviewed 3/3 back precautions, Required Braces or Orthoses: Spinal Brace Spinal Brace: Thoracolumbosacral orthotic;Applied in sitting position;Other (comment) (with leg component)   Pertinent Vitals/Pain C/o back pain did not rate but tearful at end of session due to pain.  RN states pt has already had pain medication recently.      Mobility  Bed Mobility Bed Mobility: Not assessed Transfers Transfers: Stand to Sit Stand to Sit: 4: Min guard Details for Transfer Assistance: Pt standing at bedside without brace on with NT present.  Total (A) to donn back brace Ambulation/Gait Ambulation/Gait Assistance: 4: Min guard Ambulation Distance (Feet): 120 Feet Assistive device: Rolling walker Ambulation/Gait Assistance Details: Pt with slow but steady gait.   Gait Pattern: Decreased stride length Gait velocity: decreased Stairs: No Wheelchair  Mobility Wheelchair Mobility: No      PT Goals (current goals can now be found in the care plan section) Acute Rehab PT Goals Patient Stated Goal: To go to rehab first PT Goal Formulation: With patient Time For Goal Achievement: 08/15/13 Potential to Achieve Goals: Good  Visit Information  Last PT Received On: 08/09/13 Assistance Needed: +1 History of Present Illness: L5-S1 fusion    Subjective Data  Patient Stated Goal: To go to rehab first   Cognition  Cognition Arousal/Alertness: Awake/alert Behavior During Therapy: WFL for tasks assessed/performed Overall Cognitive Status: Within Functional Limits for tasks assessed Memory: Decreased recall of precautions    Balance     End of Session PT - End of Session Equipment Utilized During Treatment: Back brace;Gait belt Activity Tolerance: Patient tolerated treatment well Patient left: in chair;with call bell/phone within reach Nurse Communication: Mobility status   GP     Lara Mulch 08/09/2013, 11:20 AM   Verdell Face, PTA [phone] 08/09/2013

## 2013-08-10 DIAGNOSIS — M48061 Spinal stenosis, lumbar region without neurogenic claudication: Secondary | ICD-10-CM | POA: Diagnosis present

## 2013-08-10 LAB — GLUCOSE, CAPILLARY
Glucose-Capillary: 95 mg/dL (ref 70–99)
Glucose-Capillary: 87 mg/dL (ref 70–99)

## 2013-08-10 NOTE — Progress Notes (Signed)
Physical Therapy Treatment Patient Details Name: [name] [name] MRN: [id number] DOB: 1951-10-09 Today's Date: 08/10/2013 Time: 4098-1191 PT Time Calculation (min): 23 min  PT Assessment / Plan / Recommendation  History of Present Illness L5-S1 fusion   PT Comments   More painful today than last session; While Ms. Brodt is walking relatively well, she continues to need assistance with safely managing bed mobility, transfers, and her brace  Follow Up Recommendations  CIR     Does the patient have the potential to tolerate intense rehabilitation     Barriers to Discharge        Equipment Recommendations  Other (comment) (TBD)    Recommendations for Other Services Rehab consult  Frequency Min 5X/week   Progress towards PT Goals Progress towards PT goals: Progressing toward goals (more painful today, so extremely slow progress)  Plan Current plan remains appropriate    Precautions / Restrictions Precautions Precautions: Fall;Back Precaution Booklet Issued: Yes (comment) Precaution Comments:  Reviewed 3/3 back precautions, Required Braces or Orthoses: Spinal Brace Spinal Brace: Thoracolumbosacral orthotic;Applied in sitting position;Other (comment) (with leg component) Spinal Brace Comments: Pt tends to stand to don brace, despite cues otherwise   Pertinent Vitals/Pain 9/10 with bed mobility;  patient repositioned for comfort     Mobility  Bed Mobility Bed Mobility: Sit to Supine Sit to Supine: 3: Mod assist (heavy mod assist) Details for Bed Mobility Assistance: Cues for log roll technique back to bed, however, pt opened out of sidelying immediaely once her head was close to the pillow; while she did keep her hips and shoulders in the same plane, it resulted in supoptimal positioning in the bed Transfers Transfers: Stand to Sit;Sit to Stand Sit to Stand: 4: Min assist Stand to Sit: 4: Min assist;To bed Details for Transfer Assistance: Continues to require total assist to  don brace; max cues for safety, precautions, hand placement Ambulation/Gait Ambulation/Gait Assistance: 4: Min guard Ambulation Distance (Feet): 100 Feet Assistive device: Rolling walker Ambulation/Gait Assistance Details: Cues for posture, and to self-monitor for activity tolerance; Pt reports walking today is more difficult than yesterday Gait Pattern: Decreased stride length Gait velocity: decreased Stairs: No Wheelchair Mobility Wheelchair Mobility: No    Exercises     PT Diagnosis:    PT Problem List:   PT Treatment Interventions:     PT Goals (current goals can now be found in the care plan section) Acute Rehab PT Goals Patient Stated Goal: To go to rehab first PT Goal Formulation: With patient Time For Goal Achievement: 08/15/13 Potential to Achieve Goals: Good  Visit Information  Last PT Received On: 08/10/13 Assistance Needed: +1 History of Present Illness: L5-S1 fusion    Subjective Data  Subjective: "I'm going to work" re: work to get better, independent, and be able to go home Patient Stated Goal: To go to rehab first   Cognition  Cognition Arousal/Alertness: Awake/alert Behavior During Therapy: WFL for tasks assessed/performed Overall Cognitive Status: Within Functional Limits for tasks assessed (quite emotionally labile) Memory: Decreased recall of precautions    Balance     End of Session PT - End of Session Equipment Utilized During Treatment: Back brace;Gait belt Activity Tolerance: Patient tolerated treatment well Patient left: in chair;with call bell/phone within reach Nurse Communication: Mobility status   GP     Olen Pel [place], [place] [phone]  08/10/2013, 1:14 PM

## 2013-08-10 NOTE — Progress Notes (Signed)
Patient states has "huge welts all over my body", especially on lower torso and thighs.  No welts or redness observed, other than areas where patient had been scratching.  Lotion applied over sites of concern and patient stated "felt better", though still wanted benadryl, as she felt it would make the "welts go away."  Continued to monitor for reddened raised areas, but could see none.

## 2013-08-10 NOTE — Progress Notes (Signed)
Patient ID: [name] [name], female   DOB: [birth year], 62 y.o.   MRN: [id number] PATIENT ID: [name] [name]  MRN: [id number]  DOB/AGE:  [birth year] / 62 y.o.  3 Days Post-Op Procedure(s) (LRB): POSTERIOR LUMBAR FUSION 1 LEVEL (Left)    PROGRESS NOTE Subjective: Patient reports fairly dramatic relief of the radicular pain going down her leg, she was able walk in the hallway up proximally 120 feet and it is felt by both physical therapy and occupational therapy that she is a good candidate for inpatient rehabilitation consult has been sent.  Objective: Vital signs in last 24 hours: Filed Vitals:   08/09/13 1526 08/09/13 2140 08/09/13 2158 08/10/13 0619  BP: 144/74 147/74  145/73  Pulse: 108 102  107  Temp: 98.8 F (37.1 C) 99.6 F (37.6 C)  100.6 F (38.1 C)  TempSrc: Oral Oral  Oral  Resp: 18 20  19   SpO2: 97% 100% 94% 96%      Intake/Output from previous day: I/O last 3 completed shifts: In: 5352.5 [P.O.:510; I.V.:4842.5] Out: -    Intake/Output this shift:     LABORATORY DATA:  Recent Labs  08/09/13 1610 08/09/13 2127 08/10/13 0717  GLUCAP 130* 109* 109*    Examination: Neurologically intact ABD soft Neurovascular intact Sensation intact distally Intact pulses distally Dorsiflexion/Plantar flexion intact Incision: moderate drainage No cellulitis present}  Assessment:   3 Days Post-Op Procedure(s) (LRB): POSTERIOR LUMBAR FUSION 1 LEVEL (Left) ADDITIONAL DIAGNOSIS:  Diabetes, Hypertension and Bipolar disorder, history of lung cancer, history of colitis appear  Plan: PT/OT WBAT, THA  posterior precautions  DVT Prophylaxis: SCDx72 hrs, ASA 325 mg BID x 2 weeks  DISCHARGE PLAN: Inpatient Rehab   DISCHARGE NEEDS: Dan Humphreys

## 2013-08-11 LAB — GLUCOSE, CAPILLARY
Glucose-Capillary: 87 mg/dL (ref 70–99)
Glucose-Capillary: 103 mg/dL — ABNORMAL HIGH (ref 70–99)
Glucose-Capillary: 110 mg/dL — ABNORMAL HIGH (ref 70–99)

## 2013-08-11 NOTE — Discharge Summary (Signed)
Patient ID: [name] [name] MRN: [id number] DOB/AGE: 1950-12-13 62 y.o.  Admit date: 08/07/2013 Discharge date: 08/11/2013  Admission Diagnoses: Spinal Stenosis, Lumbar Radiculopathy  Discharge Diagnoses:  Same  Past Medical History  Diagnosis Date  . Hypertension   . Diabetes mellitus   . Bipolar 1 disorder   . Heart murmur   . Sciatica   . Arthritis   . Anxiety   . Diverticulosis   . Chronic low back pain   . DDD (degenerative disc disease), lumbar   . Chronic shoulder pain   . Chronic neck pain   . Hepatitis B     Unclear when initially diagnosed, labs in Epic from 03/09/13  . Hepatitis C     Unclear when initially diagnosed, labs in Epic from 03/09/13  . C. difficile diarrhea     April and February 2014  . Adenocarcinoma of lung, stage 1   . COPD (chronic obstructive pulmonary disease)   . GERD (gastroesophageal reflux disease)   . H/O hiatal hernia   . Headache(784.0)     hx    Surgeries: Procedure(s): POSTERIOR LUMBAR FUSION 1 LEVEL on 08/07/2013   Discharged Condition: Improved  Hospital Course: [name] [name] is an 62 y.o. female who was admitted 08/07/2013 for operative treatment ofSpinal stenosis of lumbar region. Patient has severe unremitting pain that affects sleep, daily activities, and work/hobbies. After pre-op clearance the patient was taken to the operating room on 08/07/2013 and underwent  Procedure(s): POSTERIOR LUMBAR FUSION 1 LEVEL.    Patient was given perioperative antibiotics: Anti-infectives   Start     Dose/Rate Route Frequency Ordered Stop   08/07/13 2000  vancomycin (VANCOCIN) IVPB 1000 mg/200 mL premix     1,000 mg 200 mL/hr over 60 Minutes Intravenous  Once 08/07/13 1532 08/07/13 2106   08/07/13 0600  vancomycin (VANCOCIN) IVPB 1000 mg/200 mL premix     1,000 mg 200 mL/hr over 60 Minutes Intravenous On call to O.R. 08/06/13 1438 08/07/13 0730       Patient was given sequential compression devices, early ambulation to prevent  DVT.  Patient benefited maximally from hospital stay and there were no complications.    Recent vital signs: Patient Vitals for the past 24 hrs:  BP Temp Temp src Pulse Resp SpO2  08/11/13 0618 143/79 mmHg 98.8 F (37.1 C) Oral 103 20 98 %  08/10/13 2247 - - - - - 96 %  08/10/13 2132 137/78 mmHg 98.6 F (37 C) Oral 94 19 95 %  08/10/13 1557 156/89 mmHg 99.2 F (37.3 C) Oral 103 18 98 %      Discharge Medications:     Medication List    STOP taking these medications       acetaminophen 500 MG tablet  Commonly known as:  TYLENOL     FISH OIL PO     HYDROcodone-acetaminophen 5-325 MG per tablet  Commonly known as:  NORCO/VICODIN     methocarbamol 500 MG tablet  Commonly known as:  ROBAXIN      TAKE these medications       ALPRAZolam 1 MG tablet  Commonly known as:  XANAX  Take 0.5 mg by mouth 2 (two) times daily as needed for anxiety.     aspirin EC 81 MG tablet  Take 81 mg by mouth daily.     COMBIVENT RESPIMAT 20-100 MCG/ACT Aers respimat  Generic drug:  Ipratropium-Albuterol  Inhale 1 puff into the lungs every 6 (six) hours.     dicyclomine 20  MG tablet  Commonly known as:  BENTYL  Take 1 tablet (20 mg total) by mouth 3 (three) times daily with meals.     ergocalciferol 50000 UNITS capsule  Commonly known as:  VITAMIN D2  Take 50,000 Units by mouth every Monday.     gabapentin 400 MG capsule  Commonly known as:  NEURONTIN  Take 400 mg by mouth 3 (three) times daily.     loratadine 10 MG tablet  Commonly known as:  CLARITIN  Take 10 mg by mouth every morning.     metFORMIN 500 MG tablet  Commonly known as:  GLUCOPHAGE  Take 500 mg by mouth 2 (two) times daily with a meal.     olmesartan-hydrochlorothiazide 40-12.5 MG per tablet  Commonly known as:  BENICAR HCT  Take 1 tablet by mouth daily.     ondansetron 4 MG tablet  Commonly known as:  ZOFRAN  Take 1 tablet (4 mg total) by mouth every 6 (six) hours.     simvastatin 20 MG tablet  Commonly  known as:  ZOCOR  Take 20 mg by mouth every evening.     traZODone 50 MG tablet  Commonly known as:  DESYREL  Take 50 mg by mouth at bedtime as needed for sleep.        Diagnostic Studies: Dg Chest 2 View  08/01/2013   *RADIOLOGY REPORT*  Clinical Data: Preoperative evaluation for back surgery  CHEST - 2 VIEW  Comparison: 05/21/2013  Findings: The cardiac shadow is stable.  The lungs are clear bilaterally.  No acute infiltrate or sizable effusion is noted.  No acute bony abnormality is seen.  IMPRESSION: No acute abnormality noted.   Original Report Authenticated By: Alcide Clever, M.D.   Dg Lumbar Spine 2-3 Views  08/07/2013   *RADIOLOGY REPORT*  Clinical Data: L5-S1 posterior lumbar interbody fusion.  LUMBAR SPINE - 2-3 VIEW  Comparison: CT 05/27/2013.  Findings: Initial image demonstrates localization needles in the L4- L5 and L5-S1 posterior interspinous spaces. Subsequent image demonstrate soft tissue retractors dorsal to L5.  There is a probe appears to extend under the lamina of L5.  IMPRESSION: Intraoperative localization as above.   Original Report Authenticated By: Andreas Newport, M.D.   Dg Lumbar Spine 2-3 Views  08/07/2013   CLINICAL DATA:  L5-S1 posterior fusion  EXAM: LUMBAR SPINE - 2-3 VIEW  COMPARISON:  Operative images, 08/07/2013 at 8:14 a.m.  FINDINGS: Left-sided pedicle screws and and interconnecting rods diffuse L5-S1. The orthopedic hardware is well seated and aligned. A radiolucent spacer maintains disc height. There is a grade 1 anterolisthesis of L5 on S1.  IMPRESSION: L5-S1 fusion as detailed. No evidence of an operative complication.   Electronically Signed   By: Amie Portland   On: 08/07/2013 11:05   Dg Chest Port 1 View  08/07/2013   CLINICAL DATA:  One level posterior fusion. Right central line placement.  EXAM: PORTABLE CHEST - 1 VIEW  COMPARISON:  08/01/2013  FINDINGS: Right central line in place with the tip in the SVC. Heart is normal size. Mild hyperinflation of  the lungs without focal airspace opacity or effusion. No acute bony abnormality. No pneumothorax.  IMPRESSION: Right central line tip in the SVC. No pneumothorax.  Mild hyperinflation   Electronically Signed   By: Charlett Nose M.D.   On: 08/07/2013 12:00   Dg Outside Films Spine  08/07/2013   This examination belongs to an outside facility and is stored  here for comparison purposes only.  Contact the originating outside  institution for any associated report or interpretation.   Disposition: SNF  F/U in office 2 weeks PO Written scripts in chart for px meds  Signed: Georga Bora 08/11/2013, 10:18 AM

## 2013-08-11 NOTE — Progress Notes (Signed)
Pt in need of prolonged rehab with limited assistance at home and therefore not  a candidate for inpt rehab at this time. I have alerted RN CM and SW. [phone]

## 2013-08-11 NOTE — Consult Note (Signed)
Physical Medicine and Rehabilitation Consult Reason for Consult: Lumbar L5-S1 stenosis with radiculopathy Referring Physician: Dr. Yevette Edwards   HPI: [name] [name] is a 62 y.o. 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 Admitted 08/07/2013 with low back pain radiating to the lower extremities. X-rays and imaging revealed lumbar stenosis L5-S1 with spondylolisthesis/radiculopathy. Underwent L5-S1 lumbar interbody fusion placement of posterior instrumentation 08/08/2013 per Dr. Yevette Edwards. TLSO brace when out of bed applied in sitting position. Postoperative pain management. Physical and occupational therapy evaluations completed with recommendations of physical medicine rehabilitation consult to consider inpatient rehabilitation services.   Review of Systems  HENT: Positive for neck pain.   Gastrointestinal:       GERD  Musculoskeletal: Positive for myalgias and back pain.  Neurological: Positive for headaches.  Psychiatric/Behavioral:       Anxiety and bipolar disorder   Past Medical History  Diagnosis Date  . Hypertension   . Diabetes mellitus   . Bipolar 1 disorder   . Heart murmur   . Sciatica   . Arthritis   . Anxiety   . Diverticulosis   . Chronic low back pain   . DDD (degenerative disc disease), lumbar   . Chronic shoulder pain   . Chronic neck pain   . Hepatitis B     Unclear when initially diagnosed, labs in Epic from 03/09/13  . Hepatitis C     Unclear when initially diagnosed, labs in Epic from 03/09/13  . C. difficile diarrhea     April and February 2014  . Adenocarcinoma of lung, stage 1   . COPD (chronic obstructive pulmonary disease)   . GERD (gastroesophageal reflux disease)   . H/O hiatal hernia   . RUEAVWUJ(811.9)     hx   Past Surgical History  Procedure Laterality Date  . Facial tumor removal Right 2000  . Brain tumor removal  09  . Brain surgery      in [place]  . Video bronchoscopy  12/04/2012     Procedure: VIDEO BRONCHOSCOPY;  Surgeon: Delight Ovens, MD;  Location: [facility] OR;  Service: Thoracic;  Laterality: N/A;  . Video assisted thoracoscopy (vats)/wedge resection  12/04/2012    Procedure: VIDEO ASSISTED THORACOSCOPY (VATS)/WEDGE RESECTION;  Surgeon: Delight Ovens, MD;  Location: [facility] OR;  Service: Thoracic;  Laterality: Right;  . Lobectomy  12/04/2012    Procedure: LOBECTOMY;  Surgeon: Delight Ovens, MD;  Location: [name] OR;  Service: Thoracic;  Laterality: Right;  completion of right upper lobectomy and lymph node disection, placement of on q pump  . Tubal ligation    . Tonsillectomy     Family History  Problem Relation Age of Onset  . Hypertension Father     deceased  . Diabetes Father   . Heart disease Father   . Hyperlipidemia Father   . Hypertension Mother   . Hyperlipidemia Mother   . Diabetes Mother   . Hypertension Sister   . Hyperlipidemia Sister   . Hypertension Brother   . Hyperlipidemia Brother    Social History:  reports that she quit smoking about 5 weeks ago. Her smoking use included Cigarettes. She has a 2.5 pack-year smoking history. She has never used smokeless tobacco. She reports that she uses illicit drugs (Hydrocodone and Marijuana). She reports that she does not drink alcohol. Allergies:  Allergies  Allergen Reactions  . Haldol [Haloperidol Decanoate] Other (See Comments)    tongue swelling  . Metronidazole Other (See Comments)  Severe stomach upset--was instructed to avoid Flagyl.  . Morphine And Related Hives    hives  . Penicillins Rash   Medications Prior to Admission  Medication Sig Dispense Refill  . ALPRAZolam (XANAX) 1 MG tablet Take 0.5 mg by mouth 2 (two) times daily as needed for anxiety.       Marland Kitchen aspirin EC 81 MG tablet Take 81 mg by mouth daily.      Marland Kitchen dicyclomine (BENTYL) 20 MG tablet Take 1 tablet (20 mg total) by mouth 3 (three) times daily with meals.  90 tablet  0  . ergocalciferol (VITAMIN D2) 50000 UNITS capsule Take  50,000 Units by mouth every Monday.      . gabapentin (NEURONTIN) 400 MG capsule Take 400 mg by mouth 3 (three) times daily.       . Ipratropium-Albuterol (COMBIVENT RESPIMAT) 20-100 MCG/ACT AERS respimat Inhale 1 puff into the lungs every 6 (six) hours.      Marland Kitchen loratadine (CLARITIN) 10 MG tablet Take 10 mg by mouth every morning.       . metFORMIN (GLUCOPHAGE) 500 MG tablet Take 500 mg by mouth 2 (two) times daily with a meal.       . olmesartan-hydrochlorothiazide (BENICAR HCT) 40-12.5 MG per tablet Take 1 tablet by mouth daily.      . ondansetron (ZOFRAN) 4 MG tablet Take 1 tablet (4 mg total) by mouth every 6 (six) hours.  12 tablet  0  . simvastatin (ZOCOR) 20 MG tablet Take 20 mg by mouth every evening.      . traZODone (DESYREL) 50 MG tablet Take 50 mg by mouth at bedtime as needed for sleep.      . [DISCONTINUED] acetaminophen (TYLENOL) 500 MG tablet Take 1 tablet (500 mg total) by mouth every 6 (six) hours as needed for pain.  30 tablet  0  . [DISCONTINUED] HYDROcodone-acetaminophen (NORCO/VICODIN) 5-325 MG per tablet Take 1-2 tablets by mouth every 6 (six) hours as needed for pain.  40 tablet  0  . [DISCONTINUED] methocarbamol (ROBAXIN) 500 MG tablet Take 1 tablet (500 mg total) by mouth 2 (two) times daily.  20 tablet  0  . [DISCONTINUED] Omega-3 Fatty Acids (FISH OIL PO) Take 1 capsule by mouth daily.        Home: Home Living Family/patient expects to be discharged to:: Private residence Living Arrangements: Children Available Help at Discharge: Family;Available PRN/intermittently Type of Home: House Home Access: Stairs to enter [employer] of Steps: 5 Entrance Stairs-Rails: Right;Left;Can reach both Home Layout: One level Home Equipment: None  Functional History:   Functional Status:  Mobility: Bed Mobility Bed Mobility: Sit to Supine Rolling Left: 4: Min assist;With rail Left Sidelying to Sit: 3: Mod assist;With rails;HOB flat Sitting - Scoot to Edge of Bed:  4: Min guard Sit to Supine: 3: Mod assist (heavy mod assist) Transfers Transfers: Stand to Sit;Sit to Stand Sit to Stand: 4: Min assist Sit to Stand: Patient Percentage: 50% Stand to Sit: 4: Min assist;To bed Stand to Sit: Patient Percentage: 60% Ambulation/Gait Ambulation/Gait Assistance: 4: Min guard Ambulation Distance (Feet): 100 Feet Assistive device: Rolling walker Ambulation/Gait Assistance Details: Cues for posture, and to self-monitor for activity tolerance; Pt reports walking today is more difficult than yesterday Gait Pattern: Decreased stride length Gait velocity: decreased Stairs: No Wheelchair Mobility Wheelchair Mobility: No  ADL: ADL Eating/Feeding: Performed;Independent Where Assessed - Eating/Feeding: Chair Upper Body Dressing: Performed;Maximal assistance Where Assessed - Upper Body Dressing: Unsupported sitting Lower Body Dressing: Performed;+1  Total assistance Where Assessed - Lower Body Dressing: Unsupported sitting Toilet Transfer: Min guard;Simulated Toilet Transfer Method: Sit to Barista:  (bed) Equipment Used: Back brace;Rolling walker Transfers/Ambulation Related to ADLs: Min guard with RW ambulating in room and hallway ADL Comments: On OT arrival, pt on bedside commode and initially refusing therapy but then stating several minutes later that she might as well since her appetite was ruined. When OT arrived second time, pt was standing at bedside waiting for therapist.  Pt not wearing TLSO on OT arrival and required max assist to donn back brace with leg component.  Pt stating several times throughout session that she cannot manage at home for herself while her daughter is at school and work and that she does not want to go home until she is ready.  Attempted to begin LB ADL education and techinques to use but pt fatigued and stating she was done and wanted to eat.  Cognition: Cognition Overall Cognitive Status: Within Functional  Limits for tasks assessed (quite emotionally labile) Orientation Level: Oriented X4 Cognition Arousal/Alertness: Awake/alert Behavior During Therapy: WFL for tasks assessed/performed Overall Cognitive Status: Within Functional Limits for tasks assessed (quite emotionally labile) Memory: Decreased recall of precautions  Blood pressure 143/79, pulse 103, temperature 98.8 F (37.1 C), temperature source Oral, resp. rate 20, SpO2 98.00%. Physical Exam  Vitals reviewed. Constitutional: She is oriented to person, place, and time.  HENT:  Head: Normocephalic.  Eyes: EOM are normal.  Neck: Normal range of motion. Neck supple. No thyromegaly present.  Cardiovascular: Normal rate and regular rhythm.   Pulmonary/Chest: Effort normal and breath sounds normal. No respiratory distress.  Abdominal: Soft. Bowel sounds are normal. She exhibits no distension.  Neurological: She is alert and oriented to person, place, and time.  Both legs with 3+ to 4+ movement, some pain inhibition proximally. No gross sensory deficits.  Skin:  Back incision dressed  Psychiatric: She has a normal mood and affect. Her behavior is normal. Judgment and thought content normal.    Results for orders placed during the hospital encounter of 08/07/13 (from the past 24 hour(s))  GLUCOSE, CAPILLARY     Status: Abnormal   Collection Time    08/10/13  7:17 AM      Result Value Range   Glucose-Capillary 109 (*) 70 - 99 mg/dL   Comment 1 Documented in Chart     Comment 2 Notify RN    GLUCOSE, CAPILLARY     Status: None   Collection Time    08/10/13 12:00 PM      Result Value Range   Glucose-Capillary 95  70 - 99 mg/dL  GLUCOSE, CAPILLARY     Status: None   Collection Time    08/10/13  4:15 PM      Result Value Range   Glucose-Capillary 87  70 - 99 mg/dL  GLUCOSE, CAPILLARY     Status: Abnormal   Collection Time    08/10/13 10:13 PM      Result Value Range   Glucose-Capillary 121 (*) 70 - 99 mg/dL   Comment 1  Documented in Chart     Comment 2 Notify RN     No results found.  Assessment/Plan: Diagnosis: lumbar spondylolisthesis/radic s/p decompression and fusion 1. Does the need for close, 24 hr/day medical supervision in concert with the patient's rehab needs make it unreasonable for this patient to be served in a less intensive setting? Yes 2. Co-Morbidities requiring supervision/potential complications: bipolar, hep c, lung  ca 3. Due to bladder management, bowel management, safety, skin/wound care, disease management, medication administration, pain management and patient education, does the patient require 24 hr/day rehab nursing? Yes 4. Does the patient require coordinated care of a physician, rehab nurse, PT (1-2 hrs/day, 5 days/week) and OT (1-2 hrs/day, 5 days/week) to address physical and functional deficits in the context of the above medical diagnosis(es)? Yes Addressing deficits in the following areas: balance, endurance, locomotion, strength, transferring, bowel/bladder control, bathing, dressing, feeding, grooming, toileting and psychosocial support 5. Can the patient actively participate in an intensive therapy program of at least 3 hrs of therapy per day at least 5 days per week? Yes 6. The potential for patient to make measurable gains while on inpatient rehab is good and fair 7. Anticipated functional outcomes upon discharge from inpatient rehab are supervision with PT, supervision to min assist with OT, n/a with SLP. 8. Estimated rehab length of stay to reach the above functional goals is: n/a 9. Does the patient have adequate social supports to accommodate these discharge functional goals? No!! 10. Anticipated D/C setting: other 11. Anticipated post D/C treatments: other 12. Overall Rehab/Functional Prognosis: good  RECOMMENDATIONS: This patient's condition is appropriate for continued rehabilitative care in the following setting: SNF Patient has agreed to participate in  recommended program. Yes Note that insurance prior authorization may be required for reimbursement for recommended care.  Comment: Pt's daughter is unable to be there for pt during the day. She will require ongoing assistance with toileting, don/doffing brace as long as the brace needs to be worn. Recommend short term SNF, and pt agrees  Ranelle Oyster, MD, [facility] Physical Medicine & Rehabilitation     08/11/2013

## 2013-08-11 NOTE — Progress Notes (Signed)
4 Days S/P TLIF. Pt LBP is improving. Has been making slow steady gains in PT/OT. Rehab consult has recommended SNF. Social work working on placement. Leg pain continues to be resolved, pt describes tightness and "pulling" sensation in posterior thigh. No numbness/tingling. Pt eager to D/C to rehab facility.   BP 143/79  Pulse 103  Temp(Src) 98.8 F (37.1 C) (Oral)  Resp 20  SpO2 98%  Pt walking comfortably from bathroom to bedside with assistance of walker. Looks much improved in contrast to Friday. Dressing C/D/I. - Homans BIL. NVI.   4 Days PO TLIF  -Cont PT/OT until D/C   -TLSO brace at all times when OOB   -Back precautions   -D/C to SNF, pending placement  -Cont current px meds   -Written scripts in chart  -F/U in Office 2wks PO

## 2013-08-11 NOTE — Progress Notes (Signed)
Physical Therapy Treatment Patient Details Name: [name] [name] MRN: [id number] DOB: 05/16/1951 Today's Date: 08/11/2013 Time: 4098-1191 PT Time Calculation (min): 34 min  PT Assessment / Plan / Recommendation  History of Present Illness L5-S1 fusion   PT Comments   [name] [name] has made very good progress from last session to this one; Knowing [facility] is unable to take [name] [name], the progress she has made, and that therapies will likely not be paid for at SNF, we must consider dc home at this point;   Still, While 24 hour assist  and HHtherapies for transition home and home safety would be optimal, I don't know that they will be available to [name] [name]; would she qualify for an Aide (especially for ADLs, don/doff brace)?   Follow Up Recommendations  Home health PT;Supervision/Assistance - 24 hour (HHPT and 24 hour assist would be optimal)     Does the patient have the potential to tolerate intense rehabilitation     Barriers to Discharge        Equipment Recommendations  Rolling walker with 5" wheels;3in1 (PT)    Recommendations for Other Services    Frequency Min 5X/week   Progress towards PT Goals Progress towards PT goals: Progressing toward goals  Plan Discharge plan needs to be updated    Precautions / Restrictions Precautions Precautions: Fall;Back Precaution Booklet Issued: Yes (comment) Precaution Comments:  Reviewed 3/3 back precautions, Required Braces or Orthoses: Spinal Brace Spinal Brace: Thoracolumbosacral orthotic;Applied in sitting position;Other (comment) (with leg component) Spinal Brace Comments: Pt tends to stand to don brace, despite cues otherwise   Pertinent Vitals/Pain Less than yesterday, with less "[place]" cramping; did not specifically rate when asked    Mobility  Transfers Transfers: Stand to Sit;Sit to Stand Sit to Stand: 4: Min guard Stand to Sit: 4: Min guard;To chair/3-in-1;With armrests Details for Transfer Assistance: Not requiring  physical assist for transitions today; Cues for technique; Noted managing pretty well with unlocking leg component of brace before sitting Ambulation/Gait Ambulation/Gait Assistance: 4: Min guard;5: Supervision Ambulation Distance (Feet): 200 Feet Assistive device: Rolling walker Ambulation/Gait Assistance Details: No gross losses of balance, and noted better gait efficiency and RW proximity Stairs: No Wheelchair Mobility Wheelchair Mobility: No    Exercises     PT Diagnosis:    PT Problem List:   PT Treatment Interventions:     PT Goals (current goals can now be found in the care plan section) Acute Rehab PT Goals Patient Stated Goal: To go to rehab first PT Goal Formulation: With patient Time For Goal Achievement: 08/15/13 Potential to Achieve Goals: Good  Visit Information  Last PT Received On: 08/11/13 Assistance Needed: +1 History of Present Illness: L5-S1 fusion    Subjective Data  Subjective: feeling better than yesterday; less cramping Patient Stated Goal: To go to rehab first   Cognition  Cognition Arousal/Alertness: Awake/alert Behavior During Therapy: WFL for tasks assessed/performed Overall Cognitive Status: Within Functional Limits for tasks assessed (quite emotionally labile) Memory: Decreased recall of precautions    Balance     End of Session PT - End of Session Equipment Utilized During Treatment: Back brace;Gait belt Activity Tolerance: Patient tolerated treatment well Patient left: in chair;with call bell/phone within reach Nurse Communication: Mobility status   GP     Olen Pel [place], [place] [phone]  08/11/2013, 4:41 PM

## 2013-08-12 LAB — GLUCOSE, CAPILLARY
Glucose-Capillary: 117 mg/dL — ABNORMAL HIGH (ref 70–99)
Glucose-Capillary: 83 mg/dL (ref 70–99)
Glucose-Capillary: 94 mg/dL (ref 70–99)

## 2013-08-12 MED ORDER — HYDROMORPHONE HCL 2 MG PO TABS
2.0000 mg | ORAL_TABLET | ORAL | Status: DC | PRN
  Administered 2013-08-12 – 2013-08-14 (×11): 2 mg via ORAL
  Filled 2013-08-12 (×11): qty 1

## 2013-08-12 MED ORDER — OXYCODONE HCL ER 10 MG PO T12A
10.0000 mg | EXTENDED_RELEASE_TABLET | Freq: Two times a day (BID) | ORAL | Status: DC
  Administered 2013-08-12 – 2013-08-14 (×5): 10 mg via ORAL
  Filled 2013-08-12 (×5): qty 1

## 2013-08-12 NOTE — Progress Notes (Signed)
Discussed pt case with Raynelle Fanning, PTA, and Amy, LCSW;  This is a tough situation: she cannot don/doff her brace independently, and we aren't sure if she has the support at home to assist (or can she organize and coordinate times when she needs to don/doff her brace with when she DOES have assist at home?); Does [name] qualify for an Aide at home?  Furthermore, [name] had quite an emotional response to the idea of going home, and now the barriers to going home seem less functional mobility in nature, and more psychological/behavioral; Would it be possible to consult Psych while pt is here? Is dc to Behavioral Health an option?  As noted in Julie's note, PT will continue to follow as needed; Thanks,  [place], [place] [phone]

## 2013-08-12 NOTE — Progress Notes (Signed)
Of note Dr Yevette Edwards and myself were contacted while in the OR regarding [name] [name] refusing to have her central line removed. Per the nursing staff she stated her percocet was not controlling her pain and the IV Dilaudid was the only thing providing her pain relief. She was reportedly threatening to "sue the hospital" if they removed the line. The central line site was examined by the nursing staff and no sign of infection or redness was perceived. We asked that they inform the pt we are only trying to help her and prevent adverse effects such as infection as she has now had her central line for 5 days. She was informed that she may maintain the line until tomorrow and that we would d/c her percocet and utilize PO dilaudid instead for pain control as she reports better pain control with this. She was also started on oxycontin q12hrs earlier this morning and will be maintained on this. She may keep her IV dilaudid for breakthrough pain for 1 additional day. This was all relayed to the nursing staff while we were in the OR and the medication changes were done.   Several hours later I received a second call from the nursing staff. The floor nurse stated the pt was now demanding her central line be removed or she would "rip in out myself" She stated that since she was receiving the dilaudid PO she no longer needed the central line and wanted it removed immediately. I informed the nursing staff it was OK to remove the central line if they were able to do it today, if not she will have to wait until tomorrow.  They are going to proceed with removing the central line and continue her PO dilaudid, valium and oxycontin.   Behavioral health has still not consulted on the pt for her bipolarism and suicidal ideations.

## 2013-08-12 NOTE — Progress Notes (Signed)
Chaplain responded to referral from nurse and request from patient. RN said patient was anxious and agitated. Patient was worked up, but calmed considerably during our visit. Patient requested prayer. Chaplain offered active listening, a calming presence, emotional reflecting and reframing, and prayer.  Maurene Capes, Chaplain

## 2013-08-12 NOTE — Progress Notes (Signed)
Physical Therapy Treatment Patient Details Name: [name] [name] MRN: [id number] DOB: [birth year] Today's Date: 08/12/2013 Time: 0937-1000 PT Time Calculation (min): 23 min  PT Assessment / Plan / Recommendation  History of Present Illness L5-S1 fusion   PT Comments   Patient labile throughout session. Patient getting upset that we are "kicking her out". Patient upset with PTs, CSW, RNs and MD. Patient stated that we all know shes suicidal and feels hopeless and are still "kicking her out". Spoke with PT and CSW regarding situation. Will follow up as necessary  Follow Up Recommendations  Home health PT;Supervision/Assistance - 24 hour     Does the patient have the potential to tolerate intense rehabilitation     Barriers to Discharge        Equipment Recommendations  Rolling walker with 5" wheels;3in1 (PT)    Recommendations for Other Services    Frequency Min 5X/week   Progress towards PT Goals Progress towards PT goals: Progressing toward goals  Plan Current plan remains appropriate    Precautions / Restrictions Precautions Precautions: Fall;Back Precaution Comments:  Reviewed 3/3 back precautions, Required Braces or Orthoses: Spinal Brace Spinal Brace: Thoracolumbosacral orthotic;Applied in sitting position;Other (comment) Spinal Brace Comments: Pt tends to stand to don brace, despite cues otherwise   Pertinent Vitals/Pain no apparent distress    Mobility  Bed Mobility Rolling Left: 4: Min assist;With rail Left Sidelying to Sit: 4: Min assist Sitting - Scoot to Edge of Bed: 4: Min guard Details for Bed Mobility Assistance: Cues for log roll technique. A for shoulders up out of bed Transfers Sit to Stand: 4: Min guard;From bed Stand to Sit: 4: Min guard;To chair/3-in-1;With armrests Details for Transfer Assistance: Cues for technique Ambulation/Gait Ambulation/Gait Assistance: 4: Min guard Ambulation Distance (Feet): 200 Feet Assistive device: Rolling  walker Ambulation/Gait Assistance Details: No LOB. Safe use of RW Gait Pattern: Decreased stride length;Step-through pattern Gait velocity: decreased    Exercises     PT Diagnosis:    PT Problem List:   PT Treatment Interventions:     PT Goals (current goals can now be found in the care plan section)    Visit Information  Last PT Received On: 08/12/13 Assistance Needed: +1 History of Present Illness: L5-S1 fusion    Subjective Data      Cognition  Cognition Arousal/Alertness: Awake/alert Behavior During Therapy: WFL for tasks assessed/performed Overall Cognitive Status: Within Functional Limits for tasks assessed    Balance     End of Session PT - End of Session Equipment Utilized During Treatment: Back brace;Gait belt Activity Tolerance: Patient tolerated treatment well Patient left: in chair;with call bell/phone within reach Nurse Communication: Mobility status   GP     Fredrich Birks 08/12/2013, 11:47 AM  08/12/2013 Fredrich Birks PTA [phone] pager [phone] office

## 2013-08-12 NOTE — Progress Notes (Addendum)
5 Days S/P TLIF. Pt LBP is improving. Has been making slow steady gains in PT/OT. Rehab consult has recommended SNF however placement is complicated secondary to insurance. Pt unable to attend rehab facility anywhere and will require D/C home. Social work working on acquiring home health, it appears pt qualifies for 3 visits of home health services. Leg pain continues to be resolved, pt describes tightness and "pulling" sensation in posterior thigh. No numbness/tingling. Pt is very disgruntled on today's visit, expresses anger and frustration with staff, feels as though they are "kicking her out" she apparently voiced a desire to hurt her therapist this morning and also expressed thoughts of suicide this morning. History of bipolarism. Pt denies taking any psychiatric medications at home states she "did not like her last psychiatrist".   Pt denies having any plans for suicide at this time.   BP 131/74  Pulse 87  Temp(Src) 98.8 F (37.1 C) (Oral)  Resp 20  SpO2 98%  Pt walking comfortably from bathroom to bedside with assistance of walker. Looks much improved in contrast to Friday. Dressing C/D/I. - Homans BIL. NVI.   5 Days PO TLIF   -Cont PT/OT until D/C    -TLSO brace at all times when OOB    -Back precautions   -D/C home with home health once PT/OT completed here   -3 visits of HH to be approved    -Pt has daughter and grandchildren at home but states she is home alone from 6:45-3:30  -Cont percocet and valium   -Written scripts in chart   -D/C central line  -Oxycontin q12hrs   -Behavioral Health Consulted    -Consult for suicidal and homicidal ideations  -F/U in Office 2wks PO

## 2013-08-12 NOTE — Progress Notes (Addendum)
Order placed to d/c temp CVC from [facility].  Pt very distraught about pain control without IV access.  Stated it could be removed but that she would probably sue if something goes wrong and IV access cannot be obtained.  "I need the money and I know how to get it"  " My pain is not controlled with 2 percocet"  "I need my IV dilaudid"  "They think giving me Valium is going to be enough"  "I will just sue!"  Pt yelling and tossling around in the bed. Pt then up in the room talking loudly to self- agitated. Requested for MD to be made aware. Nettie Elm staff RN and Lynett Grimes RN Director also notified. Will notify IVT when pt agreeable to procedure.  Site CDI without signs of infection at this time.  No suitable  PIV sites readily noted with quick assessment while pt was complaining about CVC removal. Once pt calm, full assessment with tourniquet can be performed per request.

## 2013-08-12 NOTE — Progress Notes (Signed)
Utilization review completed.  

## 2013-08-13 LAB — GLUCOSE, CAPILLARY
Glucose-Capillary: 88 mg/dL (ref 70–99)
Glucose-Capillary: 83 mg/dL (ref 70–99)

## 2013-08-13 MED ORDER — GABAPENTIN 300 MG PO CAPS
600.0000 mg | ORAL_CAPSULE | Freq: Three times a day (TID) | ORAL | Status: DC
  Administered 2013-08-14 (×2): 600 mg via ORAL
  Filled 2013-08-13 (×4): qty 2

## 2013-08-13 MED ORDER — OXCARBAZEPINE 150 MG PO TABS
150.0000 mg | ORAL_TABLET | Freq: Two times a day (BID) | ORAL | Status: DC
  Administered 2013-08-13 – 2013-08-14 (×2): 150 mg via ORAL
  Filled 2013-08-13 (×3): qty 1

## 2013-08-13 NOTE — Progress Notes (Signed)
OT Cancellation Note  Patient Details Name: [name] [name] MRN: [id number] DOB: June 18, 1951   Cancelled Treatment:    Reason Eval/Treat Not Completed: Pain limiting ability to participate;Patient declined, no reason specified  Attempted skilled o.t., pt. Con't. To refuse movement until she gets "the right kind of pain medicine".  Con't. To repeat over and over that she is only being given "hospital types of medicine" and they are different from the medicine she gets at home.  States that this is the reason she is "yelling at all of [place]".  Then explained that we are also giving her "white people's medicine" and that we don't understand that "black people need different kinds of pain meds".  Says she "knows what all of [place] white people are slinging on the streets with all the different types of valium" and she does not need valium she needs "black people medicine".   Unable to redirect with multiple attempts as she con't. To perseverate and repeat over and over the conversation listed above.  States she will not get out of bed until she gets her medicine.  Robet Leu 08/13/2013, 7:33 AM

## 2013-08-13 NOTE — Progress Notes (Signed)
PT Cancellation Note  Patient Details Name: [name] [name] MRN: [id number] DOB: [birth year]   Cancelled Treatment:    Reason Eval/Treat Not Completed: Patient declined, no reason specified . Patient refusing mobility due to pain med situation. Discussed with OT. See OT note for further detail. Will follow up as able. At this time patient seen walking in room without brace. Behavioral Health a larger issue at this time than mobility.     Fredrich Birks 08/13/2013, 7:56 AM

## 2013-08-13 NOTE — Progress Notes (Signed)
Patient reports moderate back pain. Has many questions about her medications. Stated that she refused PT this morning, that she "wasn't ready". States that her left leg pain has improved.  BP 152/82  Pulse 90  Temp(Src) 99.7 F (37.6 C) (Oral)  Resp 18  SpO2 97%  Patient exhibits pressured speech and clearly exhibits a flight of ideas - jumping from topic to topic. Her thoughts seem to be disorganized and incoherent at times  Dressing removed - incision C/D/I NVI  S/p 5/1 decompression and fusion, possibly with uncontrolled bipolar disorder   - I have concerns that the patient is not getting proper treatment for her known bipolar disorder. A consult to behavioral health has been placed and is still pending. We need to get them on board ASAP - patient's medications are appropriate and will not be changed. She appears comfortable and in the appropriate level of pain. Will d/c home once behavioral health sees her and makes appropriate recommendations.

## 2013-08-13 NOTE — Consult Note (Signed)
Reason for Consult: Bipolar disorder and suicidal thoughts Referring Physician: Emilee Hero, MD  [name] [name] is an 62 y.o. female.  HPI: [name] seen and chart reviewed. Patient reported she has been diagnosed with bipolar disorder why she is living in [place] , [place]. Patient reported she was treated with lithium and Depakote but does not want to on the same medication because of needed frequent blood tests. Patient reported she went to the [facility] and she does not like the medication prescribed to her. Patient seems to be suffering with mild symptoms of mania namely hyperreligiosity, tangentiality and circumstantiality with the mild pressured speech. Patient stated she thought about ending her life when she has severe pain before the procedure. Patient denies current symptoms of depression, anxiety and suicidal or homicidal ideation and psychosis.  Mental Status Examination: [name] awake, alert and oriented to time place person and situation, appeared as per his stated age, dressed in hospital gown, and fairly groomed, and has fair eye contact. Patient has good mood and his affect was bright and full and also lab I. She has pressure speech but easily redirectable. His thought process is tangential and circumstantial needed frequent redirection.Patient has denied suicidal, homicidal ideations, intentions or plans. Patient has no evidence of auditory or visual hallucinations, delusions, and paranoia. Patient has fair to poor insight judgment and impulse control.  Past Medical History  Diagnosis Date  . Hypertension   . Diabetes mellitus   . Bipolar 1 disorder   . Heart murmur   . Sciatica   . Arthritis   . Anxiety   . Diverticulosis   . Chronic low back pain   . DDD (degenerative disc disease), lumbar   . Chronic shoulder pain   . Chronic neck pain   . Hepatitis B     Unclear when initially diagnosed, labs in Epic from 03/09/13  . Hepatitis C     Unclear when  initially diagnosed, labs in Epic from 03/09/13  . C. difficile diarrhea     April and February 2014  . Adenocarcinoma of lung, stage 1   . COPD (chronic obstructive pulmonary disease)   . GERD (gastroesophageal reflux disease)   . H/O hiatal hernia   . [id number](478.2)     hx    Past Surgical History  Procedure Laterality Date  . Facial tumor removal Right 2000  . Brain tumor removal  09  . Brain surgery      in [place]  . Video bronchoscopy  12/04/2012    Procedure: VIDEO BRONCHOSCOPY;  Surgeon: Delight Ovens, MD;  Location: [facility] OR;  Service: Thoracic;  Laterality: N/A;  . Video assisted thoracoscopy (vats)/wedge resection  12/04/2012    Procedure: VIDEO ASSISTED THORACOSCOPY (VATS)/WEDGE RESECTION;  Surgeon: Delight Ovens, MD;  Location: [facility] OR;  Service: Thoracic;  Laterality: Right;  . Lobectomy  12/04/2012    Procedure: LOBECTOMY;  Surgeon: Delight Ovens, MD;  Location: [facility] OR;  Service: Thoracic;  Laterality: Right;  completion of right upper lobectomy and lymph node disection, placement of on q pump  . Tubal ligation    . Tonsillectomy      Family History  Problem Relation Age of Onset  . Hypertension Father     deceased  . Diabetes Father   . Heart disease Father   . Hyperlipidemia Father   . Hypertension Mother   . Hyperlipidemia Mother   . Diabetes Mother   . Hypertension Sister   . Hyperlipidemia Sister   .  Hypertension Brother   . Hyperlipidemia Brother     Social History:  reports that she quit smoking about 5 weeks ago. Her smoking use included Cigarettes. She has a 2.5 pack-year smoking history. She has never used smokeless tobacco. She reports that she uses illicit drugs (Hydrocodone and Marijuana). She reports that she does not drink alcohol.  Allergies:  Allergies  Allergen Reactions  . Haldol [Haloperidol Decanoate] Other (See Comments)    tongue swelling  . Metronidazole Other (See Comments)    Severe stomach upset--was instructed to  avoid Flagyl.  . Morphine And Related Hives    hives  . Penicillins Rash    Medications: I have reviewed the patient's current medications.  Results for orders placed during the hospital encounter of 08/07/13 (from the past 48 hour(s))  GLUCOSE, CAPILLARY     Status: Abnormal   Collection Time    08/11/13 10:43 PM      Result Value Range   Glucose-Capillary 110 (*) 70 - 99 mg/dL  GLUCOSE, CAPILLARY     Status: Abnormal   Collection Time    08/12/13  6:52 AM      Result Value Range   Glucose-Capillary 117 (*) 70 - 99 mg/dL  GLUCOSE, CAPILLARY     Status: Abnormal   Collection Time    08/12/13 11:15 AM      Result Value Range   Glucose-Capillary 102 (*) 70 - 99 mg/dL   Comment 1 Notify RN    GLUCOSE, CAPILLARY     Status: None   Collection Time    08/12/13  3:49 PM      Result Value Range   Glucose-Capillary 83  70 - 99 mg/dL  GLUCOSE, CAPILLARY     Status: None   Collection Time    08/12/13  9:18 PM      Result Value Range   Glucose-Capillary 94  70 - 99 mg/dL  GLUCOSE, CAPILLARY     Status: None   Collection Time    08/13/13  6:34 AM      Result Value Range   Glucose-Capillary 88  70 - 99 mg/dL   Comment 1 Notify RN    GLUCOSE, CAPILLARY     Status: None   Collection Time    08/13/13  3:50 PM      Result Value Range   Glucose-Capillary 83  70 - 99 mg/dL   Comment 1 Notify RN      No results found.  Positive for anxiety, bad mood, behavior problems, bipolar, depression, mood swings and sleep disturbance Blood pressure 148/78, pulse 86, temperature 97.1 F (36.2 C), temperature source Oral, resp. rate 17, SpO2 98.00%.   Assessment/Plan: Bipolar disorder most recent episode mania  Recommendation: Patient does not meet criteria for acute psychiatric hospitalization Patient contracts for safety Patient will be referred to the outpatient psychiatric services Will start Trileptal 150 mg twice daily may increase as clinically increase mood swings Continue her  current medication trazodone and Xanax Increase Neurontin to 600 mg 3 times a day for better control of mood and anxiety Please contact [phone] for further assistance if needed Appreciate psychiatric consultation  Jisela Merlino,JANARDHAHA R. 08/13/2013, 7:39 PM

## 2013-08-14 LAB — GLUCOSE, CAPILLARY
Glucose-Capillary: 109 mg/dL — ABNORMAL HIGH (ref 70–99)
Glucose-Capillary: 99 mg/dL (ref 70–99)

## 2013-08-14 MED ORDER — GABAPENTIN 300 MG PO CAPS: 600.0000 mg | ORAL_CAPSULE | Freq: Three times a day (TID) | ORAL | Status: DC

## 2013-08-14 MED ORDER — OXYCODONE HCL ER 10 MG PO T12A: 10.0000 mg | EXTENDED_RELEASE_TABLET | Freq: Two times a day (BID) | ORAL | Status: DC

## 2013-08-14 MED ORDER — HYDROMORPHONE HCL 2 MG PO TABS: 2.0000 mg | ORAL_TABLET | ORAL | Status: DC | PRN

## 2013-08-14 MED ORDER — OXCARBAZEPINE 150 MG PO TABS: 150.0000 mg | ORAL_TABLET | Freq: Two times a day (BID) | ORAL | Status: DC

## 2013-08-14 NOTE — Progress Notes (Signed)
08/14/13 Patient calm and oriented. Spoke with her about HHC, she chose [facility] from the [employer] of agencies. Contacted Kristen at [facility] and set up [facility], PT and if covered HHOT and Aide. Contacted Darian at [facility] and requested rolling walker be delivered to room. Jacquelynn Cree RN, BSN, CCM

## 2013-08-14 NOTE — Progress Notes (Signed)
7 Days PO. Patient states she is doing "great" denies back pain, denies leg pain. She states she is feeling well and eager to go home. She found the visits by the chaplain and psychiatry very beneficial and states that her current medications are managing her pain well. She has a daughter and 2 grandchildren at home and states her daughter is available to help her apply the brace in the morning before work and assist her in removal before bed. Home health is being arranged to come out for three days to assist in ADL's and patient education, patient is aware and eager for their assistance. She is very pleasant this morning.   BP 125/85  Pulse 85  Temp(Src) 98.7 F (37.1 C) (Oral)  Resp 18  SpO2 99%  Pt laying in hospital bed comfortably, incision C/D, no sign of infection. NVI. Thought process and mood very stable and coherent, vastly improved from yesterday. No signs of mania. Denies suicidal ideation or depression.   A/P  -Bipolar Disorder with recent manic episode   -Psychiatry (Dr. Elsie Saas) saw pt yesterday, much improved today    -Mood/Affect now stable and appropriate    -Medication Chg's per psych:     -Neurontin increased to 600mg  TID (Script printed, in chart)     -Trileptal 150mg  BID initiated (Script printed, in chart)     -Continue trazadone and xanax    -Pt safe to d/c home    -outpt f/u with psychiatry established  -7 Days PO TLIF   -Pain well controlled on current regimen, will cont    -Oxycontin q12 hrs (Script printed in chart)    -Dilaudid 2mg  1 PO q4-6hrs PRN px (Script printed, in chart)    -Valium (Script written in chart)   -Complete PT/OT today, pt amenable    -D/C home today to family after PT/OT    -Pt very amenable to this and eager to go home   -[facility] for 3 days after D/C, ordered    -Pt to continue TLSO brace when OOB and Back precautions for 6 weeks, discussed today   -D/C instructions printed, in pt's chart   -F/U appt in office 2wks PO (Appt card in  chart)    -

## 2013-08-14 NOTE — Progress Notes (Signed)
Physical Therapy Treatment Patient Details Name: Hilarie Sinha MRN: [id number] DOB: 18-Jul-1951 Today's Date: 08/14/2013 Time: 4098-1191 PT Time Calculation (min): 27 min  PT Assessment / Plan / Recommendation  History of Present Illness     PT Comments   Patient showing much better mood with therapist this morning. Eager and motivated to ambulate and progress in hopes of going home later today. Patient does have steps to enter the house so she completed stair training with me this morning. Patient stated that MD said she could not don brace to go to restroom and back. Will need clarification. Unsure if patient will be compliant with wearing brace at home.   Follow Up Recommendations  Home health PT;Supervision/Assistance - 24 hour     Does the patient have the potential to tolerate intense rehabilitation     Barriers to Discharge        Equipment Recommendations  Rolling walker with 5" wheels;3in1 (PT)    Recommendations for Other Services    Frequency Min 5X/week   Progress towards PT Goals Progress towards PT goals: Progressing toward goals  Plan Current plan remains appropriate    Precautions / Restrictions Precautions Precautions: Fall;Back Precaution Comments:  Reviewed 3/3 back precautions, Required Braces or Orthoses: Spinal Brace Spinal Brace: Thoracolumbosacral orthotic;Applied in sitting position;Other (comment) Spinal Brace Comments: Pt tends to stand to don brace, despite cues otherwise; Patient stated that MD told her she could go to restroom and back without brace. Will need clarification   Pertinent Vitals/Pain no apparent distress     Mobility  Bed Mobility Rolling Left: 5: Supervision Left Sidelying to Sit: 5: Supervision Details for Bed Mobility Assistance: Cues for log roll technique Transfers Sit to Stand: 6: Modified independent (Device/Increase time) Stand to Sit: 6: Modified independent (Device/Increase time) Ambulation/Gait Ambulation/Gait  Assistance: 5: Supervision Ambulation Distance (Feet): 400 Feet Assistive device: Rolling walker Ambulation/Gait Assistance Details: Cues for no twisting Gait Pattern: Decreased stride length;Step-through pattern Gait velocity: decreased Stairs: Yes Stairs Assistance: 4: Min guard Stair Management Technique: One rail Right;Alternating pattern;Forwards Number of Stairs: 5    Exercises     PT Diagnosis:    PT Problem List:   PT Treatment Interventions:     PT Goals (current goals can now be found in the care plan section)    Visit Information  Last PT Received On: 08/14/13 Assistance Needed: +1    Subjective Data      Cognition  Cognition Arousal/Alertness: Awake/alert Behavior During Therapy: WFL for tasks assessed/performed Overall Cognitive Status: Within Functional Limits for tasks assessed    Balance     End of Session PT - End of Session Equipment Utilized During Treatment: Back brace;Gait belt Activity Tolerance: Patient tolerated treatment well Patient left: in chair;with call bell/phone within reach Nurse Communication: Mobility status   GP     Fredrich Birks 08/14/2013, 10:48 AM 08/14/2013 Fredrich Birks PTA [phone] pager [phone] office

## 2013-08-17 NOTE — Progress Notes (Signed)
Agree with additional goal for steps to ensure safe transition home. [place], [place], [place] [phone]

## 2013-08-29 IMAGING — CT CT ABD-PELV W/O CM
1 of 2 series · 15 of 32 positions shown, 19 images · non-contrast
Comparison: CT abdomen and pelvis with contrast 12/26/2012.

CLINICAL DATA: 62-year-old female with abdominal pain nausea and
vomiting.

CT ABDOMEN AND PELVIS WITHOUT CONTRAST
TECHNIQUE: Multidetector CT imaging of the abdomen and pelvis was
performed following the standard protocol without intravenous
contrast.

[Series 2: abd/pel w/o · axial · non-contrast · 0.72mm/px · z∈[-456,-71]mm · 15 of 85 slices shown, 19 images]
[im 4/85  soft-tissue]
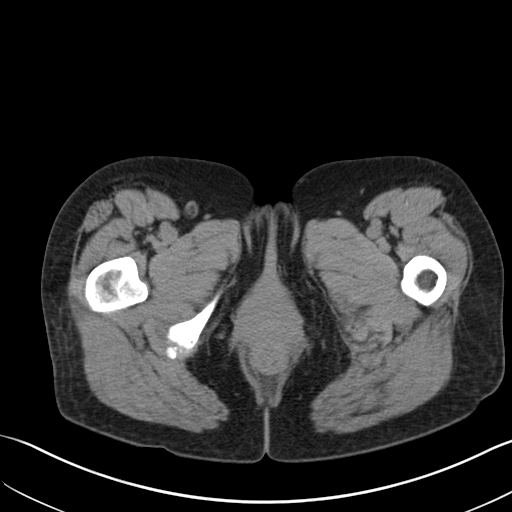
[im 4/85  bone]
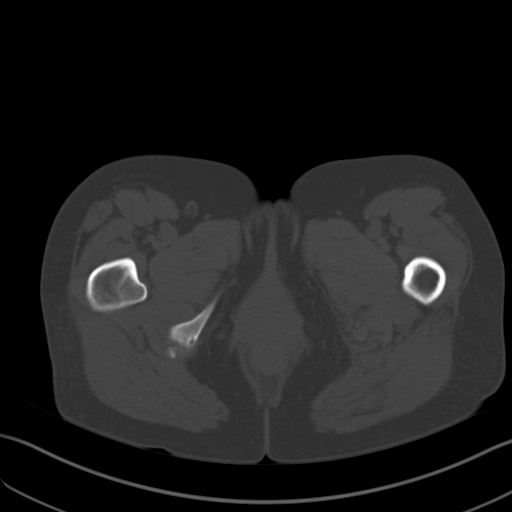
[im 11/85  soft-tissue]
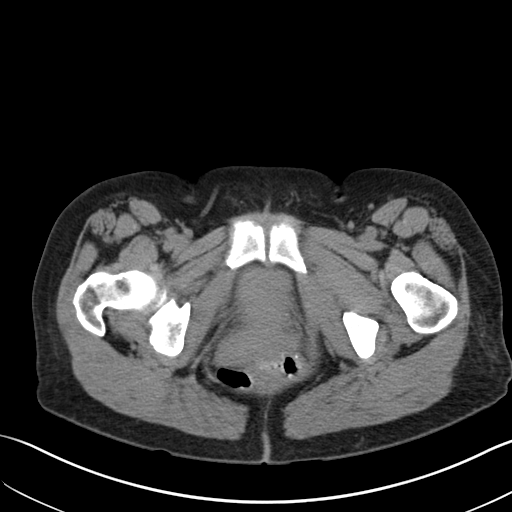
[im 19/85  soft-tissue]
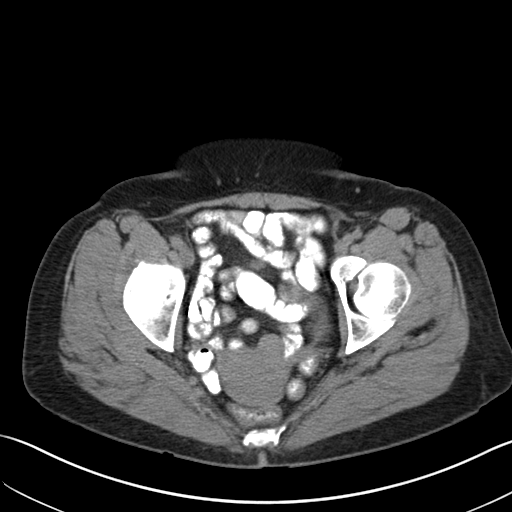
[im 22/85  soft-tissue]
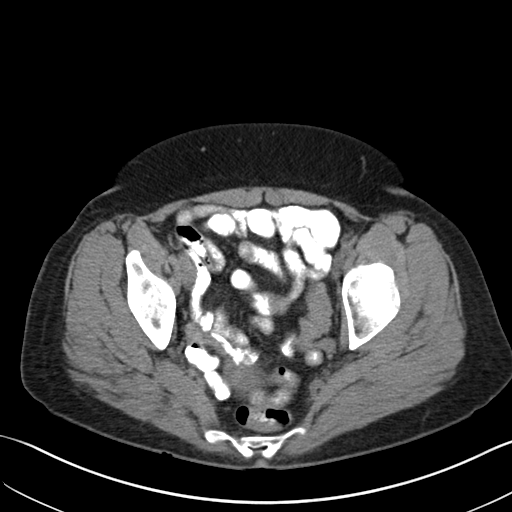
[im 30/85  soft-tissue]
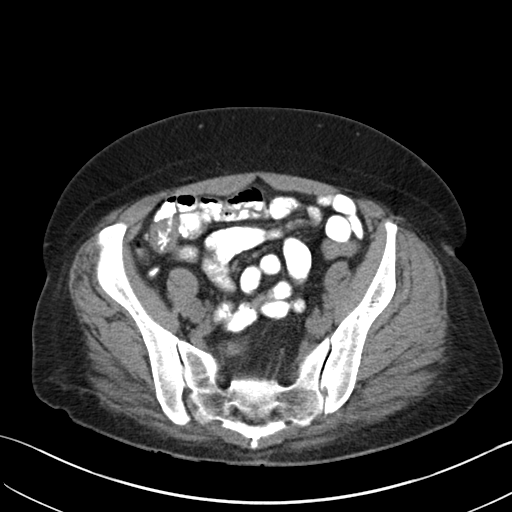
[im 37/85  soft-tissue]
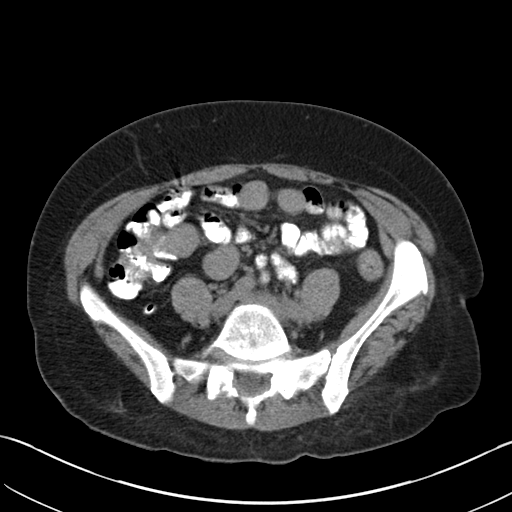
[im 44/85  soft-tissue]
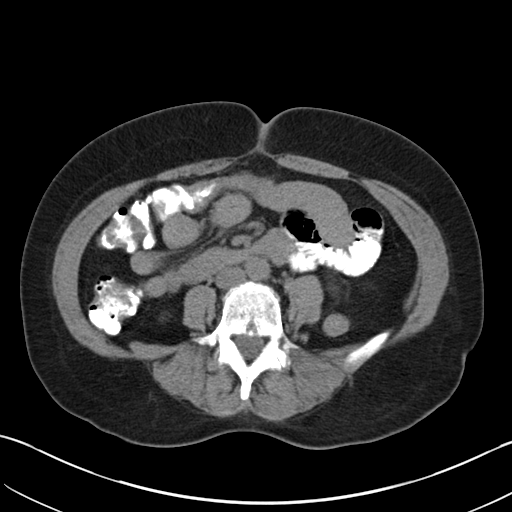
[im 48/85  soft-tissue]
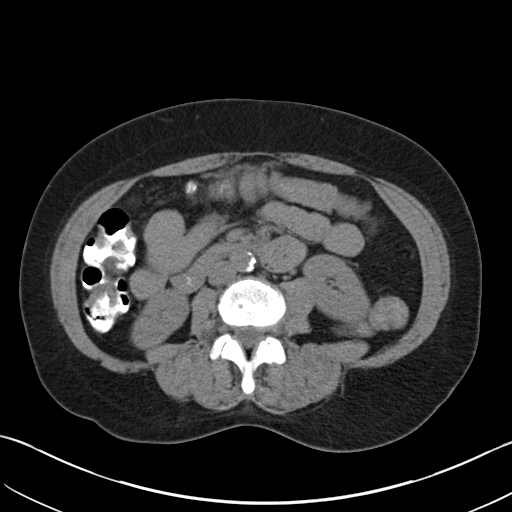
[im 55/85  soft-tissue]
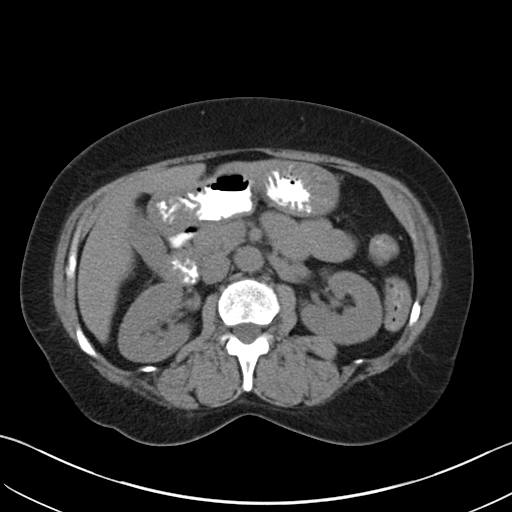
[im 55/85  bone]
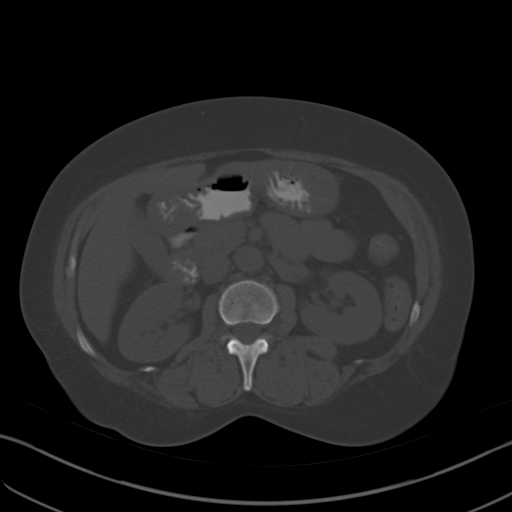
[im 63/85  soft-tissue]
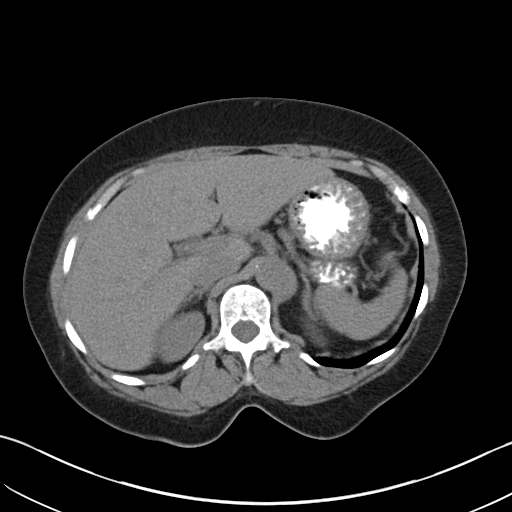
[im 66/85  soft-tissue]
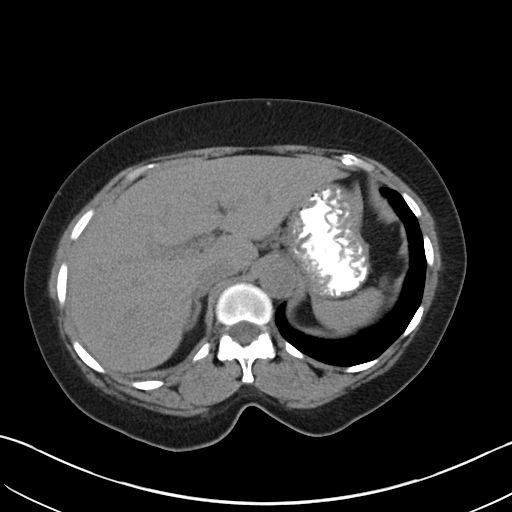
[im 70/85  lung]
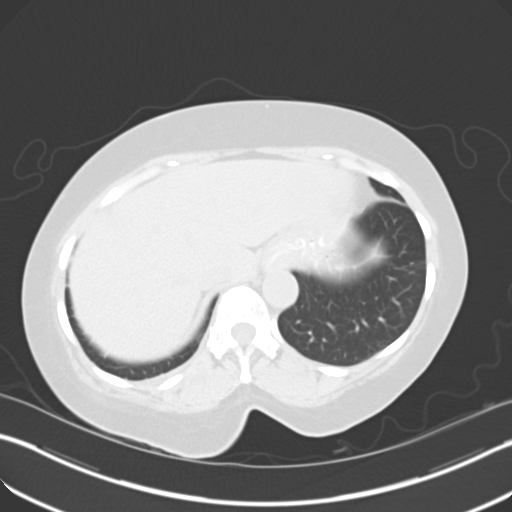
[im 74/85  soft-tissue]
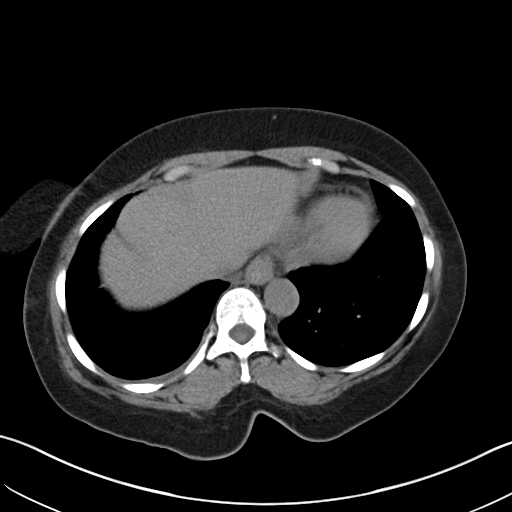
[im 74/85  lung]
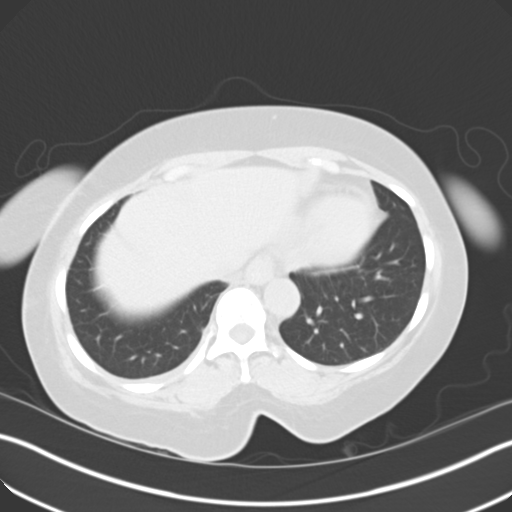
[im 77/85  lung]
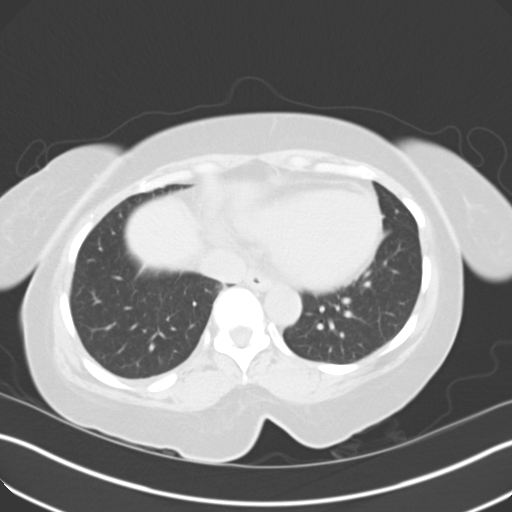
[im 81/85  soft-tissue]
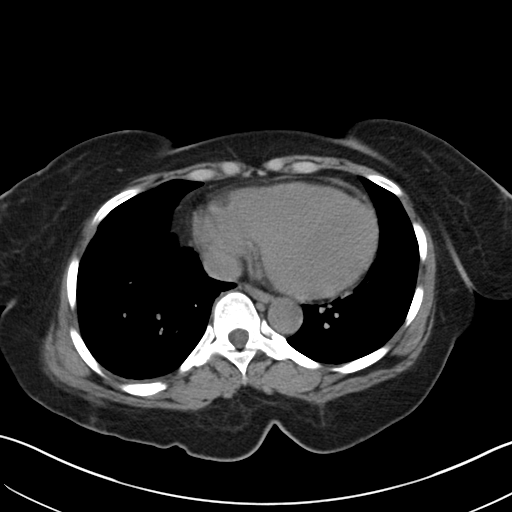
[im 81/85  lung]
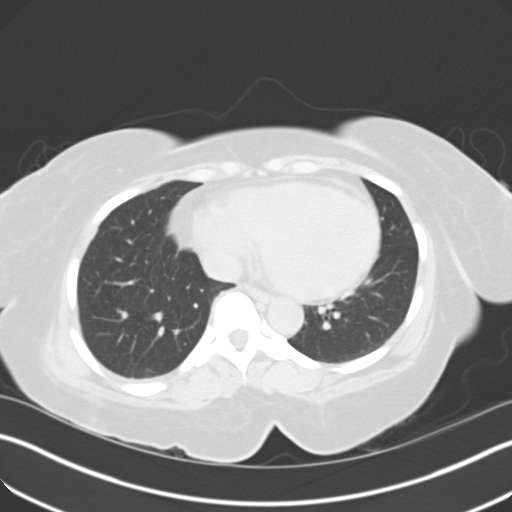

[15 of 32 positions shown; findings below may reference images not displayed]

FINDINGS: Interval significantly reduced right pleural effusion,
now only trace.  No left pleural or pericardial effusion.  Stable
cardiomegaly.  Lung bases essentially clear.

Stable visualized osseous structures.  Mild anterolisthesis L5 on
S1 with facet degeneration again noted.

No pelvic free fluid.  Oral contrast in the rectum.  Negative
noncontrast uterus and adnexa.  Contrast mixed with urine in the
bladder.

Negative distal colon.  Left and distal transverse colon
decompressed which probably accounts for the appearance of wall
thickening.  No associated mesenteric inflammation.  Negative
proximal transverse colon and hepatic flexure.  Normal appendix
containing contrast.  No dilated small bowel.  Contrast in the
stomach and duodenum which appear within normal limits.

Noncontrast liver, gallbladder, spleen, pancreas and adrenal glands
are within normal limits.

Small volume of excrete contrast in nondilated renal collecting
systems.  Kidneys within normal limits.  No hydroureter.

No abdominal free fluid.  No focal inflammatory stranding
identified.
IMPRESSION: 1.  Decreased right pleural effusion, with only trace residual.
2.  No bowel obstruction, acute or inflammatory change identified
in the abdomen or pelvis.  Normal appendix.

## 2013-10-01 ENCOUNTER — Emergency Department (HOSPITAL_COMMUNITY): Payer: Medicaid Other

## 2013-10-01 ENCOUNTER — Emergency Department (HOSPITAL_COMMUNITY)
Admission: EM | Admit: 2013-10-01 | Discharge: 2013-10-01 | Disposition: A | Discharge status: Home / Self Care | Payer: Medicaid Other | Attending: Emergency Medicine | Admitting: Emergency Medicine

## 2013-10-01 ENCOUNTER — Encounter (HOSPITAL_COMMUNITY): Payer: Self-pay | Admitting: Emergency Medicine

## 2013-10-01 DIAGNOSIS — Z79899 Other long term (current) drug therapy: Secondary | ICD-10-CM | POA: Insufficient documentation

## 2013-10-01 DIAGNOSIS — Z87891 Personal history of nicotine dependence: Secondary | ICD-10-CM | POA: Insufficient documentation

## 2013-10-01 DIAGNOSIS — Z880 Allergy status to penicillin: Secondary | ICD-10-CM | POA: Insufficient documentation

## 2013-10-01 DIAGNOSIS — Z8619 Personal history of other infectious and parasitic diseases: Secondary | ICD-10-CM | POA: Insufficient documentation

## 2013-10-01 DIAGNOSIS — J4489 Other specified chronic obstructive pulmonary disease: Secondary | ICD-10-CM | POA: Insufficient documentation

## 2013-10-01 DIAGNOSIS — M25519 Pain in unspecified shoulder: Secondary | ICD-10-CM | POA: Insufficient documentation

## 2013-10-01 DIAGNOSIS — J449 Chronic obstructive pulmonary disease, unspecified: Secondary | ICD-10-CM | POA: Insufficient documentation

## 2013-10-01 DIAGNOSIS — R6883 Chills (without fever): Secondary | ICD-10-CM | POA: Insufficient documentation

## 2013-10-01 DIAGNOSIS — E119 Type 2 diabetes mellitus without complications: Secondary | ICD-10-CM | POA: Insufficient documentation

## 2013-10-01 DIAGNOSIS — F319 Bipolar disorder, unspecified: Secondary | ICD-10-CM | POA: Insufficient documentation

## 2013-10-01 DIAGNOSIS — R197 Diarrhea, unspecified: Secondary | ICD-10-CM | POA: Insufficient documentation

## 2013-10-01 DIAGNOSIS — R109 Unspecified abdominal pain: Secondary | ICD-10-CM | POA: Insufficient documentation

## 2013-10-01 DIAGNOSIS — R61 Generalized hyperhidrosis: Secondary | ICD-10-CM | POA: Insufficient documentation

## 2013-10-01 DIAGNOSIS — G8929 Other chronic pain: Secondary | ICD-10-CM | POA: Insufficient documentation

## 2013-10-01 DIAGNOSIS — F411 Generalized anxiety disorder: Secondary | ICD-10-CM | POA: Insufficient documentation

## 2013-10-01 DIAGNOSIS — R51 Headache: Secondary | ICD-10-CM | POA: Insufficient documentation

## 2013-10-01 DIAGNOSIS — Z7982 Long term (current) use of aspirin: Secondary | ICD-10-CM | POA: Insufficient documentation

## 2013-10-01 DIAGNOSIS — Z8719 Personal history of other diseases of the digestive system: Secondary | ICD-10-CM | POA: Insufficient documentation

## 2013-10-01 DIAGNOSIS — I10 Essential (primary) hypertension: Secondary | ICD-10-CM | POA: Insufficient documentation

## 2013-10-01 DIAGNOSIS — R011 Cardiac murmur, unspecified: Secondary | ICD-10-CM | POA: Insufficient documentation

## 2013-10-01 DIAGNOSIS — R5381 Other malaise: Secondary | ICD-10-CM | POA: Insufficient documentation

## 2013-10-01 DIAGNOSIS — M542 Cervicalgia: Secondary | ICD-10-CM | POA: Insufficient documentation

## 2013-10-01 LAB — CBC WITH DIFFERENTIAL/PLATELET
Basophils Absolute: 0 10*3/uL (ref 0.0–0.1)
Basophils Relative: 1 % (ref 0–1)
HCT: 39.3 % (ref 36.0–46.0)
Hemoglobin: 13.2 g/dL (ref 12.0–15.0)
Lymphocytes Relative: 41 % (ref 12–46)
MCH: 29.1 pg (ref 26.0–34.0)
Monocytes Absolute: 0.4 10*3/uL (ref 0.1–1.0)
Monocytes Relative: 7 % (ref 3–12)
Neutro Abs: 2.7 10*3/uL (ref 1.7–7.7)
Neutrophils Relative %: 47 % (ref 43–77)
WBC: 5.6 10*3/uL (ref 4.0–10.5)

## 2013-10-01 LAB — URINALYSIS, ROUTINE W REFLEX MICROSCOPIC
Bilirubin Urine: NEGATIVE
Glucose, UA: NEGATIVE mg/dL
Hgb urine dipstick: NEGATIVE
Ketones, ur: NEGATIVE mg/dL
Protein, ur: NEGATIVE mg/dL
Specific Gravity, Urine: 1.025 (ref 1.005–1.030)
Urobilinogen, UA: 1 mg/dL (ref 0.0–1.0)

## 2013-10-01 LAB — COMPREHENSIVE METABOLIC PANEL
AST: 17 U/L (ref 0–37)
Albumin: 3.8 g/dL (ref 3.5–5.2)
Alkaline Phosphatase: 95 U/L (ref 39–117)
BUN: 12 mg/dL (ref 6–23)
CO2: 26 mEq/L (ref 19–32)
Calcium: 9.4 mg/dL (ref 8.4–10.5)
Chloride: 101 mEq/L (ref 96–112)
Creatinine, Ser: 0.56 mg/dL (ref 0.50–1.10)
GFR calc non Af Amer: >90 mL/min (ref >90)
Potassium: 3.7 mEq/L (ref 3.5–5.1)
Total Bilirubin: 0.3 mg/dL (ref 0.3–1.2)

## 2013-10-01 LAB — CLOSTRIDIUM DIFFICILE BY PCR: Toxigenic C. Difficile by PCR: NEGATIVE

## 2013-10-01 LAB — URINE MICROSCOPIC-ADD ON

## 2013-10-01 MED ORDER — IOHEXOL 300 MG/ML  SOLN
100.0000 mL | Freq: Once | INTRAMUSCULAR | Status: AC | PRN
  Administered 2013-10-01: 100 mL via INTRAVENOUS

## 2013-10-01 MED ORDER — HYDROMORPHONE HCL PF 1 MG/ML IJ SOLN
1.0000 mg | INTRAMUSCULAR | Status: DC | PRN
  Administered 2013-10-01: 1 mg via INTRAVENOUS
  Filled 2013-10-01: qty 1

## 2013-10-01 MED ORDER — IOHEXOL 300 MG/ML  SOLN: 25.0000 mL | INTRAMUSCULAR | Status: AC

## 2013-10-01 MED ORDER — SODIUM CHLORIDE 0.9 % IV BOLUS (SEPSIS)
1000.0000 mL | Freq: Once | INTRAVENOUS | Status: AC
  Administered 2013-10-01: 1000 mL via INTRAVENOUS

## 2013-10-01 MED ORDER — IOHEXOL 300 MG/ML  SOLN: 25.0000 mL | Freq: Once | INTRAMUSCULAR | Status: DC | PRN

## 2013-10-01 NOTE — ED Provider Notes (Signed)
I saw and evaluated the patient, reviewed the resident's note and I agree with the findings and plan.  EKG Interpretation   None       Angiocath insertion Performed by: Dagmar Hait  Consent: Verbal consent obtained. Risks and benefits: risks, benefits and alternatives were discussed Time out: Immediately prior to procedure a "time out" was called to verify the correct patient, procedure, equipment, support staff and site/side marked as required.  Preparation: Patient was prepped and draped in the usual sterile fashion.  Vein Location: R AC  Yes Ultrasound Guided  Gauge: 20  Normal blood return and flush without difficulty Patient tolerance: Patient tolerated the procedure well with no immediate complications.   I saw patient - diarrhea for past few days. No fevers. Recent hx of C. Diff colitis. No recent illness otherwise. Belly with some mild diffuse tenderness. Labs normal. Normal lactate. CT normal. Patient stable for discharge.   Dagmar Hait, MD 10/01/13 [phone]

## 2013-10-01 NOTE — ED Notes (Signed)
Paged iv team 

## 2013-10-01 NOTE — ED Notes (Signed)
Iv team respond.

## 2013-10-01 NOTE — ED Notes (Addendum)
Pt c/o lower abd pain and diarrhea c 4 days; pt sts some sweats and generalized weakness

## 2013-10-01 NOTE — ED Provider Notes (Signed)
CSN: [id number]     Arrival date & time 10/01/13  1050 History   First MD Initiated Contact with Patient 10/01/13 1107     Chief Complaint  Patient presents with  . Abdominal Pain  . Diarrhea   (Consider location/radiation/quality/duration/timing/severity/associated sxs/prior Treatment) [name] a 62 y.o. female presenting with abdominal pain and diarrhea.  Abdominal Pain Associated symptoms: chills and diarrhea   Associated symptoms: no chest pain, no cough, no dysuria, no fever, no shortness of breath and no sore throat   Diarrhea Associated symptoms: abdominal pain, chills, diaphoresis and headaches   Associated symptoms: no fever     62 year old female here with left-sided abdominal pain and diarrhea for 3 days. He describes the pain as a 7-8/10 intermittent pulling type sensation in her left upper and lower quadrants, she denies radiation, and states that it aggravated by bowel movements only being alleviated by lying on her left side. She also complains of chills, sweats, progressive generalized weakness, and decreased appetite for the same duration. She is states that her diarrhea smells like prior episodes of c diff and she is concerned about that. She had some L arm swelling and weakness last weak that has improved since an injection by her doctor. She denies any hematochezia or melena.   Past Medical History  Diagnosis Date  . Hypertension   . Diabetes mellitus   . Bipolar 1 disorder   . Heart murmur   . Sciatica   . Arthritis   . Anxiety   . Diverticulosis   . Chronic low back pain   . DDD (degenerative disc disease), lumbar   . Chronic shoulder pain   . Chronic neck pain   . Hepatitis B     Unclear when initially diagnosed, labs in Epic from 03/09/13  . Hepatitis C     Unclear when initially diagnosed, labs in Epic from 03/09/13  . C. difficile diarrhea     April and February 2014  . Adenocarcinoma of lung, stage 1   . COPD (chronic obstructive pulmonary disease)    . GERD (gastroesophageal reflux disease)   . H/O hiatal hernia   . NWGNFAOZ(308.6)     hx   Past Surgical History  Procedure Laterality Date  . Facial tumor removal Right 2000  . Brain tumor removal  09  . Brain surgery      in [place]  . Video bronchoscopy  12/04/2012    Procedure: VIDEO BRONCHOSCOPY;  Surgeon: Delight Ovens, MD;  Location: [facility] OR;  Service: Thoracic;  Laterality: N/A;  . Video assisted thoracoscopy (vats)/wedge resection  12/04/2012    Procedure: VIDEO ASSISTED THORACOSCOPY (VATS)/WEDGE RESECTION;  Surgeon: Delight Ovens, MD;  Location: [facility] OR;  Service: Thoracic;  Laterality: Right;  . Lobectomy  12/04/2012    Procedure: LOBECTOMY;  Surgeon: Delight Ovens, MD;  Location: [facility] OR;  Service: Thoracic;  Laterality: Right;  completion of right upper lobectomy and lymph node disection, placement of on q pump  . Tubal ligation    . Tonsillectomy     Family History  Problem Relation Age of Onset  . Hypertension Father     deceased  . Diabetes Father   . Heart disease Father   . Hyperlipidemia Father   . Hypertension Mother   . Hyperlipidemia Mother   . Diabetes Mother   . Hypertension Sister   . Hyperlipidemia Sister   . Hypertension Brother   . Hyperlipidemia Brother    History  Substance Use  Topics  . Smoking status: Former Smoker -- 0.25 packs/day for 10 years    Types: Cigarettes    Quit date: 07/07/2013  . Smokeless tobacco: Never Used     Comment: Smokes pk q 3 days   marajuna aug  . Alcohol Use: No     Comment: quit 61yrs ago   OB History   Grav Para Term Preterm Abortions TAB SAB Ect Mult Living                 Review of Systems  Constitutional: Positive for chills, diaphoresis and appetite change. Negative for fever.  HENT: Negative for sore throat.   Respiratory: Negative for cough and shortness of breath.   Cardiovascular: Negative for chest pain.  Gastrointestinal: Positive for abdominal pain and diarrhea.  Genitourinary:  Negative for dysuria.  Neurological: Positive for weakness and headaches. Negative for dizziness.  Psychiatric/Behavioral: Negative for confusion.  All other systems reviewed and are negative.    Allergies  Haldol; Metronidazole; Morphine and related; and Penicillins  Home Medications   Current Outpatient Rx  Name  Route  Sig  Dispense  Refill  . ALPRAZolam (XANAX) 1 MG tablet   Oral   Take 0.5 mg by mouth 2 (two) times daily as needed for anxiety.          Marland Kitchen aspirin EC 81 MG tablet   Oral   Take 81 mg by mouth daily.         Marland Kitchen dicyclomine (BENTYL) 20 MG tablet   Oral   Take 1 tablet (20 mg total) by mouth 3 (three) times daily with meals.   90 tablet   0   . ergocalciferol (VITAMIN D2) 50000 UNITS capsule   Oral   Take 50,000 Units by mouth every Monday.         . gabapentin (NEURONTIN) 300 MG capsule   Oral   Take 2 capsules (600 mg total) by mouth 3 (three) times daily.   120 capsule   1   . gabapentin (NEURONTIN) 400 MG capsule   Oral   Take 400 mg by mouth 3 (three) times daily.          Marland Kitchen HYDROmorphone (DILAUDID) 2 MG tablet   Oral   Take 1 tablet (2 mg total) by mouth every 4 (four) hours as needed for pain.   30 tablet   0   . Ipratropium-Albuterol (COMBIVENT RESPIMAT) 20-100 MCG/ACT AERS respimat   Inhalation   Inhale 1 puff into the lungs every 6 (six) hours.         Marland Kitchen loratadine (CLARITIN) 10 MG tablet   Oral   Take 10 mg by mouth every morning.          . metFORMIN (GLUCOPHAGE) 500 MG tablet   Oral   Take 500 mg by mouth 2 (two) times daily with a meal.          . olmesartan-hydrochlorothiazide (BENICAR HCT) 40-12.5 MG per tablet   Oral   Take 1 tablet by mouth daily.         . ondansetron (ZOFRAN) 4 MG tablet   Oral   Take 1 tablet (4 mg total) by mouth every 6 (six) hours.   12 tablet   0   . OXcarbazepine (TRILEPTAL) 150 MG tablet   Oral   Take 1 tablet (150 mg total) by mouth 2 (two) times daily.   60 tablet    0   . OxyCODONE (OXYCONTIN) 10 mg T12A 12  hr tablet   Oral   Take 1 tablet (10 mg total) by mouth every 12 (twelve) hours.   60 tablet   0   . simvastatin (ZOCOR) 20 MG tablet   Oral   Take 20 mg by mouth every evening.         . traZODone (DESYREL) 50 MG tablet   Oral   Take 50 mg by mouth at bedtime as needed for sleep.          BP 130/79  Pulse 102  Temp(Src) 99.1 F (37.3 C)  Resp 18  SpO2 96% Physical Exam  Constitutional: She is oriented to person, place, and time. She appears well-developed and well-nourished. No distress.  HENT:  Head: Normocephalic and atraumatic.  Eyes: EOM are normal. Pupils are equal, round, and reactive to light.  Neck: Neck supple.  Cardiovascular: Normal rate, regular rhythm and normal heart sounds.   No murmur heard. Pulmonary/Chest: Effort normal and breath sounds normal. She has no wheezes.  Abdominal: Soft. Bowel sounds are normal. She exhibits no mass. There is tenderness in the left upper quadrant and left lower quadrant. There is no rebound and no guarding.  Musculoskeletal: She exhibits no edema.  Neurological: She is alert and oriented to person, place, and time.  Strength 4/5 on LUE to shoulder, 5/5 and sensation intact in all 4 extremities otherwise.   Describes sensation on face as sharp on R and dull on L  Skin: Skin is warm and dry. She is not diaphoretic.  Psychiatric: She has a normal mood and affect.    ED Course  Procedures (including critical care time) Labs Review Labs Reviewed  CBC WITH DIFFERENTIAL  COMPREHENSIVE METABOLIC PANEL  URINALYSIS, ROUTINE W REFLEX MICROSCOPIC   Imaging Review No results found.  EKG Interpretation   None       MDM  No diagnosis found.  62 year old female here with abdominal pain and diarrhea concerning for colitis.  Etiology at this point is unclear however is concerning for C. difficile, ischemia, or diverticulitis. We'll collect initial labs, bolus, and proceed with CT  abdomen.  CBC, CMP, UA, Lactate, Stool C diff and GI pathogen panel.  CT abd/pelvis negative for acute process, C diff negative Lactate and CBC WNL  Discussed red flags for return, Patient declines pain meds as she states that she has plenty at home.  Return for worsening symptoms.   Murtis Sink, MD [facility] Resident, PGY-2 10/01/2013, 5:30 PM        Elenora Gamma, MD 10/01/13 [phone]

## 2013-11-07 ENCOUNTER — Emergency Department (HOSPITAL_COMMUNITY)
Admission: EM | Admit: 2013-11-07 | Discharge: 2013-11-07 | Disposition: A | Discharge status: Home / Self Care | Payer: Medicaid Other | Attending: Emergency Medicine | Admitting: Emergency Medicine

## 2013-11-07 ENCOUNTER — Emergency Department (HOSPITAL_COMMUNITY): Payer: Medicaid Other

## 2013-11-07 ENCOUNTER — Encounter (HOSPITAL_COMMUNITY): Payer: Self-pay | Admitting: Emergency Medicine

## 2013-11-07 DIAGNOSIS — J449 Chronic obstructive pulmonary disease, unspecified: Secondary | ICD-10-CM | POA: Insufficient documentation

## 2013-11-07 DIAGNOSIS — M129 Arthropathy, unspecified: Secondary | ICD-10-CM | POA: Insufficient documentation

## 2013-11-07 DIAGNOSIS — E119 Type 2 diabetes mellitus without complications: Secondary | ICD-10-CM | POA: Insufficient documentation

## 2013-11-07 DIAGNOSIS — J4489 Other specified chronic obstructive pulmonary disease: Secondary | ICD-10-CM | POA: Insufficient documentation

## 2013-11-07 DIAGNOSIS — I10 Essential (primary) hypertension: Secondary | ICD-10-CM | POA: Insufficient documentation

## 2013-11-07 DIAGNOSIS — W2209XA Striking against other stationary object, initial encounter: Secondary | ICD-10-CM | POA: Insufficient documentation

## 2013-11-07 DIAGNOSIS — Z7982 Long term (current) use of aspirin: Secondary | ICD-10-CM | POA: Insufficient documentation

## 2013-11-07 DIAGNOSIS — Y9289 Other specified places as the place of occurrence of the external cause: Secondary | ICD-10-CM | POA: Insufficient documentation

## 2013-11-07 DIAGNOSIS — M25519 Pain in unspecified shoulder: Secondary | ICD-10-CM | POA: Insufficient documentation

## 2013-11-07 DIAGNOSIS — F319 Bipolar disorder, unspecified: Secondary | ICD-10-CM | POA: Insufficient documentation

## 2013-11-07 DIAGNOSIS — F411 Generalized anxiety disorder: Secondary | ICD-10-CM | POA: Insufficient documentation

## 2013-11-07 DIAGNOSIS — M5137 Other intervertebral disc degeneration, lumbosacral region: Secondary | ICD-10-CM | POA: Insufficient documentation

## 2013-11-07 DIAGNOSIS — Z8619 Personal history of other infectious and parasitic diseases: Secondary | ICD-10-CM | POA: Insufficient documentation

## 2013-11-07 DIAGNOSIS — M51379 Other intervertebral disc degeneration, lumbosacral region without mention of lumbar back pain or lower extremity pain: Secondary | ICD-10-CM | POA: Insufficient documentation

## 2013-11-07 DIAGNOSIS — R011 Cardiac murmur, unspecified: Secondary | ICD-10-CM | POA: Insufficient documentation

## 2013-11-07 DIAGNOSIS — Z87891 Personal history of nicotine dependence: Secondary | ICD-10-CM | POA: Insufficient documentation

## 2013-11-07 DIAGNOSIS — Z79899 Other long term (current) drug therapy: Secondary | ICD-10-CM | POA: Insufficient documentation

## 2013-11-07 DIAGNOSIS — Z85118 Personal history of other malignant neoplasm of bronchus and lung: Secondary | ICD-10-CM | POA: Insufficient documentation

## 2013-11-07 DIAGNOSIS — Z880 Allergy status to penicillin: Secondary | ICD-10-CM | POA: Insufficient documentation

## 2013-11-07 DIAGNOSIS — Z8719 Personal history of other diseases of the digestive system: Secondary | ICD-10-CM | POA: Insufficient documentation

## 2013-11-07 DIAGNOSIS — Y9389 Activity, other specified: Secondary | ICD-10-CM | POA: Insufficient documentation

## 2013-11-07 DIAGNOSIS — S90129A Contusion of unspecified lesser toe(s) without damage to nail, initial encounter: Secondary | ICD-10-CM | POA: Insufficient documentation

## 2013-11-07 DIAGNOSIS — S90122A Contusion of left lesser toe(s) without damage to nail, initial encounter: Secondary | ICD-10-CM

## 2013-11-07 DIAGNOSIS — M545 Low back pain, unspecified: Secondary | ICD-10-CM | POA: Insufficient documentation

## 2013-11-07 DIAGNOSIS — M542 Cervicalgia: Secondary | ICD-10-CM | POA: Insufficient documentation

## 2013-11-07 DIAGNOSIS — G8929 Other chronic pain: Secondary | ICD-10-CM | POA: Insufficient documentation

## 2013-11-07 MED ORDER — HYDROCODONE-ACETAMINOPHEN 5-325 MG PO TABS
1.0000 | ORAL_TABLET | Freq: Once | ORAL | Status: AC
  Administered 2013-11-07: 1 via ORAL
  Filled 2013-11-07: qty 1

## 2013-11-07 NOTE — ED Provider Notes (Signed)
CSN: [id number]     Arrival date & time 11/07/13  1850 History  This chart was scribed for Ruby Cola, PA-C, working with Dagmar Hait, MD by Blanchard Kelch, ED Scribe. This patient was seen in room WTR8/WTR8 and the patient's care was started at 8:23 PM.    Chief Complaint  Patient presents with  . Toe Injury    The history is provided by the patient. No language interpreter was used.    HPI Comments: [name] [name] is a 62 y.o. female who presents to the Emergency Department complaining of a toe injury that occurred . She states that she was wearing flip flops and hit her left middle toe on her brick flowerbed. She is complaining of constant, worsening pain to the affected toe onset immediately after the injury occurred. She describes the pain as sore and throbbing. She states the end of the toe is numb. She has had trouble walking on the toe. She is concerned the nail might be coming off. She poured alcohol on the toe without relief. She takes Lortab twice daily for chronic back pain but has only taken one today in the morning. She denies taking one yet tonight for the toe pain. She denies pain or numbness to any other toe.   Past Medical History  Diagnosis Date  . Hypertension   . Diabetes mellitus   . Bipolar 1 disorder   . Heart murmur   . Sciatica   . Arthritis   . Anxiety   . Diverticulosis   . Chronic low back pain   . DDD (degenerative disc disease), lumbar   . Chronic shoulder pain   . Chronic neck pain   . Hepatitis B     Unclear when initially diagnosed, labs in Epic from 03/09/13  . Hepatitis C     Unclear when initially diagnosed, labs in Epic from 03/09/13  . C. difficile diarrhea     April and February 2014  . Adenocarcinoma of lung, stage 1   . COPD (chronic obstructive pulmonary disease)   . GERD (gastroesophageal reflux disease)   . H/O hiatal hernia   . NWGNFAOZ(308.6)     hx   Past Surgical History  Procedure Laterality Date  . Facial  tumor removal Right 2000  . Brain tumor removal  09  . Brain surgery      in [place]  . Video bronchoscopy  12/04/2012    Procedure: VIDEO BRONCHOSCOPY;  Surgeon: Delight Ovens, MD;  Location[facility] OR;  Service: Thoracic;  Laterality: N/A;  . Video assisted thoracoscopy (vats)/wedge resection  12/04/2012    Procedure: VIDEO ASSISTED THORACOSCOPY (VATS)/WEDGE RESECTION;  Surgeon: Delight Ovens, MD;  Location: [facility] OR;  Service: Thoracic;  Laterality: Right;  . Lobectomy  12/04/2012    Procedure: LOBECTOMY;  Surgeon: Delight Ovens, MD;  Location: [facility] OR;  Service: Thoracic;  Laterality: Right;  completion of right upper lobectomy and lymph node disection, placement of on q pump  . Tubal ligation    . Tonsillectomy     Family History  Problem Relation Age of Onset  . Hypertension Father     deceased  . Diabetes Father   . Heart disease Father   . Hyperlipidemia Father   . Hypertension Mother   . Hyperlipidemia Mother   . Diabetes Mother   . Hypertension Sister   . Hyperlipidemia Sister   . Hypertension Brother   . Hyperlipidemia Brother    History  Substance Use Topics  .  Smoking status: Former Smoker -- 0.25 packs/day for 10 years    Types: Cigarettes    Quit date: 07/07/2013  . Smokeless tobacco: Never Used     Comment: Smokes pk q 3 days   marajuna aug  . Alcohol Use: No     Comment: quit 55yrs ago   OB History   Grav Para Term Preterm Abortions TAB SAB Ect Mult Living                 Review of Systems  Musculoskeletal: Positive for arthralgias and joint swelling.  All other systems reviewed and are negative.    Allergies  Haldol; Metronidazole; Morphine and related; and Penicillins  Home Medications   Current Outpatient Rx  Name  Route  Sig  Dispense  Refill  . ALPRAZolam (XANAX) 1 MG tablet   Oral   Take 0.5 mg by mouth 2 (two) times daily as needed for anxiety.          Marland Kitchen aspirin EC 81 MG tablet   Oral   Take 81 mg by mouth daily.          Marland Kitchen dicyclomine (BENTYL) 20 MG tablet   Oral   Take 20 mg by mouth 3 (three) times daily with meals.         . Ipratropium-Albuterol (COMBIVENT RESPIMAT) 20-100 MCG/ACT AERS respimat   Inhalation   Inhale 1 puff into the lungs every 6 (six) hours.         Marland Kitchen loratadine (CLARITIN) 10 MG tablet   Oral   Take 10 mg by mouth every morning.          . metFORMIN (GLUCOPHAGE) 500 MG tablet   Oral   Take 500 mg by mouth 2 (two) times daily with a meal.          . olmesartan-hydrochlorothiazide (BENICAR HCT) 40-12.5 MG per tablet   Oral   Take 1 tablet by mouth daily.         . ondansetron (ZOFRAN-ODT) 4 MG disintegrating tablet   Oral   Take 4 mg by mouth every 6 (six) hours as needed for nausea or vomiting.         . simvastatin (ZOCOR) 20 MG tablet   Oral   Take 20 mg by mouth every evening.         . traZODone (DESYREL) 50 MG tablet   Oral   Take 50 mg by mouth at bedtime as needed for sleep.          Triage Vitals: BP 147/89  Pulse 99  Temp(Src) 98.6 F (37 C) (Oral)  Resp 20  SpO2 97%  Physical Exam  Nursing note and vitals reviewed. Constitutional: She is oriented to person, place, and time. She appears well-developed and well-nourished. No distress.  HENT:  Head: Normocephalic and atraumatic.  Eyes:  Normal appearance  Neck: Normal range of motion.  Pulmonary/Chest: Effort normal.  Musculoskeletal: Normal range of motion.  No deformity of left foot.  Contusion dorsal aspect distal phalanx L middle toe.  Toenail intact and firmly adherent to nailbed. Entire toe as well as base of 3rd metatarsal ttp.  Pain w/ passive flexion.  Decreased sensation at very tip of toe only.   Neurological: She is alert and oriented to person, place, and time.  Psychiatric: She has a normal mood and affect. Her behavior is normal.    ED Course  Procedures (including critical care time)  DIAGNOSTIC STUDIES: Oxygen Saturation is 97% on room  air, adequate by my  interpretation.    COORDINATION OF CARE: 8:26 PM - Patient verbalizes understanding and agrees with treatment plan.    Labs Review Labs Reviewed - No data to display Imaging Review Dg Foot Complete Left  11/07/2013   CLINICAL DATA:  Trauma to 3rd digit, pain/swelling  EXAM: LEFT FOOT - COMPLETE 3+ VIEW  COMPARISON:  None.  FINDINGS: No fracture or dislocation is seen.  Mild degenerative changes the 1st MTP joint.  The visualized soft tissues are unremarkable.  IMPRESSION: No fracture or dislocation is seen.   Electronically Signed   By: Charline Bills M.D.   On: 11/07/2013 20:29    EKG Interpretation   None       MDM   1. Contusion of toe, left, initial encounter    62yo F presents w/ pain in L middle toe after stubbing on flower bed wall.  No visible nail injury.  Xray neg for fx.  Ortho tech buddy taped and provided w/ post-op shoe.  I recommended ice and elevation at home.  She has lortab for chronic back pain.  9:21 PM   I personally performed the services described in this documentation, which was scribed in my presence. The recorded information has been reviewed and is accurate.    Otilio Miu, PA-C 11/07/13 2124

## 2013-11-07 NOTE — ED Notes (Signed)
Patient states that she stubbed her toe and the nail is deformed. The patient reports that the nail is coming off

## 2013-11-07 NOTE — ED Provider Notes (Signed)
Medical screening examination/treatment/procedure(s) were performed by non-physician practitioner and as supervising physician I was immediately available for consultation/collaboration.  EKG Interpretation   None         William Shanin Szymanowski, MD 11/07/13 2249 

## 2013-11-07 NOTE — ED Notes (Signed)
Pt ambulatory to exam room with steady gait.  

## 2013-12-01 ENCOUNTER — Ambulatory Visit: Payer: Medicare Other | Admitting: Physical Therapy

## 2013-12-02 ENCOUNTER — Ambulatory Visit: Payer: Medicare Other | Attending: Orthopedic Surgery | Admitting: Physical Therapy

## 2013-12-02 DIAGNOSIS — M545 Low back pain, unspecified: Secondary | ICD-10-CM | POA: Insufficient documentation

## 2013-12-02 DIAGNOSIS — R5381 Other malaise: Secondary | ICD-10-CM | POA: Insufficient documentation

## 2013-12-02 DIAGNOSIS — E119 Type 2 diabetes mellitus without complications: Secondary | ICD-10-CM | POA: Insufficient documentation

## 2013-12-02 DIAGNOSIS — I10 Essential (primary) hypertension: Secondary | ICD-10-CM | POA: Insufficient documentation

## 2013-12-02 DIAGNOSIS — IMO0001 Reserved for inherently not codable concepts without codable children: Secondary | ICD-10-CM | POA: Insufficient documentation

## 2013-12-04 ENCOUNTER — Ambulatory Visit: Payer: Medicare Other | Admitting: Physical Therapy

## 2013-12-10 ENCOUNTER — Encounter (HOSPITAL_COMMUNITY): Payer: Self-pay | Admitting: Emergency Medicine

## 2013-12-10 ENCOUNTER — Emergency Department (HOSPITAL_COMMUNITY)
Admission: EM | Admit: 2013-12-10 | Discharge: 2013-12-10 | Disposition: A | Discharge status: Home / Self Care | Payer: Medicare Other | Attending: Emergency Medicine | Admitting: Emergency Medicine

## 2013-12-10 ENCOUNTER — Emergency Department (HOSPITAL_COMMUNITY): Payer: Medicare Other

## 2013-12-10 DIAGNOSIS — J4489 Other specified chronic obstructive pulmonary disease: Secondary | ICD-10-CM | POA: Insufficient documentation

## 2013-12-10 DIAGNOSIS — Z9889 Other specified postprocedural states: Secondary | ICD-10-CM | POA: Insufficient documentation

## 2013-12-10 DIAGNOSIS — M545 Low back pain, unspecified: Secondary | ICD-10-CM | POA: Insufficient documentation

## 2013-12-10 DIAGNOSIS — M5137 Other intervertebral disc degeneration, lumbosacral region: Secondary | ICD-10-CM | POA: Insufficient documentation

## 2013-12-10 DIAGNOSIS — M542 Cervicalgia: Secondary | ICD-10-CM | POA: Insufficient documentation

## 2013-12-10 DIAGNOSIS — Z79899 Other long term (current) drug therapy: Secondary | ICD-10-CM | POA: Insufficient documentation

## 2013-12-10 DIAGNOSIS — J449 Chronic obstructive pulmonary disease, unspecified: Secondary | ICD-10-CM | POA: Insufficient documentation

## 2013-12-10 DIAGNOSIS — Z87891 Personal history of nicotine dependence: Secondary | ICD-10-CM | POA: Insufficient documentation

## 2013-12-10 DIAGNOSIS — Z8619 Personal history of other infectious and parasitic diseases: Secondary | ICD-10-CM | POA: Insufficient documentation

## 2013-12-10 DIAGNOSIS — M25519 Pain in unspecified shoulder: Secondary | ICD-10-CM | POA: Insufficient documentation

## 2013-12-10 DIAGNOSIS — I10 Essential (primary) hypertension: Secondary | ICD-10-CM | POA: Insufficient documentation

## 2013-12-10 DIAGNOSIS — F411 Generalized anxiety disorder: Secondary | ICD-10-CM | POA: Insufficient documentation

## 2013-12-10 DIAGNOSIS — M7989 Other specified soft tissue disorders: Secondary | ICD-10-CM | POA: Insufficient documentation

## 2013-12-10 DIAGNOSIS — R209 Unspecified disturbances of skin sensation: Secondary | ICD-10-CM | POA: Insufficient documentation

## 2013-12-10 DIAGNOSIS — F319 Bipolar disorder, unspecified: Secondary | ICD-10-CM | POA: Insufficient documentation

## 2013-12-10 DIAGNOSIS — Z8719 Personal history of other diseases of the digestive system: Secondary | ICD-10-CM | POA: Insufficient documentation

## 2013-12-10 DIAGNOSIS — R079 Chest pain, unspecified: Secondary | ICD-10-CM | POA: Insufficient documentation

## 2013-12-10 DIAGNOSIS — E119 Type 2 diabetes mellitus without complications: Secondary | ICD-10-CM | POA: Insufficient documentation

## 2013-12-10 DIAGNOSIS — G8929 Other chronic pain: Secondary | ICD-10-CM | POA: Insufficient documentation

## 2013-12-10 DIAGNOSIS — Z902 Acquired absence of lung [part of]: Secondary | ICD-10-CM | POA: Insufficient documentation

## 2013-12-10 DIAGNOSIS — R011 Cardiac murmur, unspecified: Secondary | ICD-10-CM | POA: Insufficient documentation

## 2013-12-10 DIAGNOSIS — M51379 Other intervertebral disc degeneration, lumbosacral region without mention of lumbar back pain or lower extremity pain: Secondary | ICD-10-CM | POA: Insufficient documentation

## 2013-12-10 DIAGNOSIS — Z880 Allergy status to penicillin: Secondary | ICD-10-CM | POA: Insufficient documentation

## 2013-12-10 DIAGNOSIS — Z7982 Long term (current) use of aspirin: Secondary | ICD-10-CM | POA: Insufficient documentation

## 2013-12-10 DIAGNOSIS — M129 Arthropathy, unspecified: Secondary | ICD-10-CM | POA: Insufficient documentation

## 2013-12-10 DIAGNOSIS — Z85118 Personal history of other malignant neoplasm of bronchus and lung: Secondary | ICD-10-CM | POA: Insufficient documentation

## 2013-12-10 LAB — CBC WITH DIFFERENTIAL/PLATELET
BASOS ABS: 0 10*3/uL (ref 0.0–0.1)
BASOS PCT: 0 % (ref 0–1)
EOS PCT: 0 % (ref 0–5)
Eosinophils Absolute: 0 10*3/uL (ref 0.0–0.7)
HCT: 40.2 % (ref 36.0–46.0)
HEMOGLOBIN: 13.4 g/dL (ref 12.0–15.0)
LYMPHS ABS: 2.4 10*3/uL (ref 0.7–4.0)
LYMPHS PCT: 33 % (ref 12–46)
MCH: 28.5 pg (ref 26.0–34.0)
MCHC: 33.3 g/dL (ref 30.0–36.0)
MCV: 85.4 fL (ref 78.0–100.0)
Monocytes Absolute: 0.6 10*3/uL (ref 0.1–1.0)
Monocytes Relative: 8 % (ref 3–12)
NEUTROS ABS: 4.2 10*3/uL (ref 1.7–7.7)
Neutrophils Relative %: 59 % (ref 43–77)
Platelets: ADEQUATE 10*3/uL (ref 150–400)
RBC: 4.71 MIL/uL (ref 3.87–5.11)
RDW: 14.6 % (ref 11.5–15.5)
WBC: 7.2 10*3/uL (ref 4.0–10.5)

## 2013-12-10 LAB — POCT I-STAT, CHEM 8
BUN: 26 mg/dL — AB (ref 6–23)
CALCIUM ION: 1.21 mmol/L (ref 1.13–1.30)
CHLORIDE: 103 meq/L (ref 96–112)
Creatinine, Ser: 0.6 mg/dL (ref 0.50–1.10)
GLUCOSE: 87 mg/dL (ref 70–99)
HCT: 44 % (ref 36.0–46.0)
Hemoglobin: 15 g/dL (ref 12.0–15.0)
POTASSIUM: 3.8 meq/L (ref 3.7–5.3)
Sodium: 142 mEq/L (ref 137–147)
TCO2: 29 mmol/L (ref 0–100)

## 2013-12-10 LAB — POCT I-STAT TROPONIN I: Troponin i, poc: 0 ng/mL (ref 0.00–0.08)

## 2013-12-10 MED ORDER — OXYCODONE-ACETAMINOPHEN 5-325 MG PO TABS
1.0000 | ORAL_TABLET | Freq: Once | ORAL | Status: AC
  Administered 2013-12-10: 1 via ORAL
  Filled 2013-12-10: qty 1

## 2013-12-10 NOTE — ED Notes (Signed)
Pt from home with c/o of left arm and shoulder pain for increased chronic pain and swelling.  Pt reports pain spread over left chest starting the last few weeks. Hx of bone spurs in left shoulder.  Pt in NAD, A&O.

## 2013-12-10 NOTE — Discharge Instructions (Signed)
Your caregiver has diagnosed you as having chest pain that is not specific for one problem, but does not require admission.  You are at low risk for an acute heart condition or other serious illness. Chest pain comes from many different causes.  SEEK IMMEDIATE MEDICAL ATTENTION IF: You have severe chest pain, especially if the pain is crushing or pressure-like and spreads to the arms, back, neck, or jaw, or if you have sweating, nausea (feeling sick to your stomach), or shortness of breath. THIS IS AN EMERGENCY. Don't wait to see if the pain will go away. Get medical help at once. Call 911 or 0 (operator). DO NOT drive yourself to the hospital.  Your chest pain gets worse and does not go away with rest.  You have an attack of chest pain lasting longer than usual, despite rest and treatment with the medications your caregiver has prescribed.  You wake from sleep with chest pain or shortness of breath.  You feel dizzy or faint.  You have chest pain not typical of your usual pain for which you originally saw your caregiver.  Chest Pain (Nonspecific) It is often hard to give a specific diagnosis for the cause of chest pain. There is always a chance that your pain could be related to something serious, such as a heart attack or a blood clot in the lungs. You need to follow up with your caregiver for further evaluation. CAUSES   Heartburn.  Pneumonia or bronchitis.  Anxiety or stress.  Inflammation around your heart (pericarditis) or lung (pleuritis or pleurisy).  A blood clot in the lung.  A collapsed lung (pneumothorax). It can develop suddenly on its own (spontaneous pneumothorax) or from injury (trauma) to the chest.  Shingles infection (herpes zoster virus). The chest wall is composed of bones, muscles, and cartilage. Any of these can be the source of the pain.  The bones can be bruised by injury.  The muscles or cartilage can be strained by coughing or overwork.  The cartilage can be  affected by inflammation and become sore (costochondritis). DIAGNOSIS  Lab tests or other studies, such as X-rays, electrocardiography, stress testing, or cardiac imaging, may be needed to find the cause of your pain.  TREATMENT   Treatment depends on what may be causing your chest pain. Treatment may include:  Acid blockers for heartburn.  Anti-inflammatory medicine.  Pain medicine for inflammatory conditions.  Antibiotics if an infection is present.  You may be advised to change lifestyle habits. This includes stopping smoking and avoiding alcohol, caffeine, and chocolate.  You may be advised to keep your head raised (elevated) when sleeping. This reduces the chance of acid going backward from your stomach into your esophagus.  Most of the time, nonspecific chest pain will improve within 2 to 3 days with rest and mild pain medicine. HOME CARE INSTRUCTIONS   If antibiotics were prescribed, take your antibiotics as directed. Finish them even if you start to feel better.  For the next few days, avoid physical activities that bring on chest pain. Continue physical activities as directed.  Do not smoke.  Avoid drinking alcohol.  Only take over-the-counter or prescription medicine for pain, discomfort, or fever as directed by your caregiver.  Follow your caregiver's suggestions for further testing if your chest pain does not go away.  Keep any follow-up appointments you made. If you do not go to an appointment, you could develop lasting (chronic) problems with pain. If there is any problem keeping an appointment,  you must call to reschedule. SEEK MEDICAL CARE IF:   You think you are having problems from the medicine you are taking. Read your medicine instructions carefully.  Your chest pain does not go away, even after treatment.  You develop a rash with blisters on your chest. SEEK IMMEDIATE MEDICAL CARE IF:   You have increased chest pain or pain that spreads to your arm,  neck, jaw, back, or abdomen.  You develop shortness of breath, an increasing cough, or you are coughing up blood.  You have severe back or abdominal pain, feel nauseous, or vomit.  You develop severe weakness, fainting, or chills.  You have a fever. THIS IS AN EMERGENCY. Do not wait to see if the pain will go away. Get medical help at once. Call your local emergency services (911 in [place] Do not drive yourself to the hospital. MAKE SURE YOU:   Understand these instructions.  Will watch your condition.  Will get help right away if you are not doing well or get worse. Document Released: 08/16/2005 Document Revised: 01/29/2012 Document Reviewed: 06/11/2008 [facility] Patient Information 2014 [place].

## 2013-12-10 NOTE — ED Provider Notes (Signed)
10:55 AM BP 170/87  Pulse 83  Temp(Src) 98.2 F (36.8 C) (Oral)  Resp 22  Wt 149 lb (67.586 kg)  SpO2 98% Patient received and care handoff from Keo This is a 63 year old female with a history of long-term smoking, cancer, chronic neck, shoulder pain, left hand paresthesias.  She is managed at [facility].  Patient presented to the ED with acute on chronic exacerbation of her left neck shoulder and arm pain however there was radiation to the chest today.  Patient was concerned and sought attention here. Patient currently undergoing acute coronary syndrome workup.  No dyspnea.   Results for orders placed during the hospital encounter of 12/10/13  CBC WITH DIFFERENTIAL      Result Value Range   WBC 7.2  4.0 - 10.5 K/uL   RBC 4.71  3.87 - 5.11 MIL/uL   Hemoglobin 13.4  12.0 - 15.0 g/dL   HCT 40.2  36.0 - 46.0 %   MCV 85.4  78.0 - 100.0 fL   MCH 28.5  26.0 - 34.0 pg   MCHC 33.3  30.0 - 36.0 g/dL   RDW 14.6  11.5 - 15.5 %   Platelets    150 - 400 K/uL   Value: PLATELET CLUMPS NOTED ON SMEAR, COUNT APPEARS ADEQUATE   Neutrophils Relative % 59  43 - 77 %   Lymphocytes Relative 33  12 - 46 %   Monocytes Relative 8  3 - 12 %   Eosinophils Relative 0  0 - 5 %   Basophils Relative 0  0 - 1 %   Neutro Abs 4.2  1.7 - 7.7 K/uL   Lymphs Abs 2.4  0.7 - 4.0 K/uL   Monocytes Absolute 0.6  0.1 - 1.0 K/uL   Eosinophils Absolute 0.0  0.0 - 0.7 K/uL   Basophils Absolute 0.0  0.0 - 0.1 K/uL   RBC Morphology ELLIPTOCYTES    POCT I-STAT, CHEM 8      Result Value Range   Sodium 142  137 - 147 mEq/L   Potassium 3.8  3.7 - 5.3 mEq/L   Chloride 103  96 - 112 mEq/L   BUN 26 (*) 6 - 23 mg/dL   Creatinine, Ser 0.60  0.50 - 1.10 mg/dL   Glucose, Bld 87  70 - 99 mg/dL   Calcium, Ion 1.21  1.13 - 1.30 mmol/L   TCO2 29  0 - 100 mmol/L   Hemoglobin 15.0  12.0 - 15.0 g/dL   HCT 44.0  36.0 - 46.0 %  POCT I-STAT TROPONIN I      Result Value Range   Troponin i, poc 0.00  0.00 - 0.08 ng/mL    Comment 3             1:30 PM Patient refusing repeat troponin. On PE  Chest pain is reproducible with palpation.Patient states she has to be home for her grand children when they get off the bus. I feel the patient may leave with strong return precautions as she has not had strong Rule out. I do feel however that this is an acute exacerbation of her chronic pain.  Patient expresses understanding and agrees with POC.   Margarita Mail, PA-C 12/10/13 1523

## 2013-12-10 NOTE — ED Notes (Addendum)
Pt refused to have i-Stat troponin drawn and stated she needed to leave.  Provider notified.

## 2013-12-10 NOTE — ED Notes (Signed)
Patient transported to X-ray 

## 2013-12-10 NOTE — ED Provider Notes (Signed)
Medical screening examination/treatment/procedure(s) were performed by non-physician practitioner and as supervising physician I was immediately available for consultation/collaboration.  EKG Interpretation    Date/Time:  Wednesday December 10 2013 09:48:42 EST Ventricular Rate:  80 PR Interval:  222 QRS Duration: 75 QT Interval:  370 QTC Calculation: 427 R Axis:   60 Text Interpretation:  Sinus rhythm Prolonged PR interval Consider left atrial enlargement No significant change since last tracing Confirmed by Braun Rocca  MD-J, Montrel Donahoe ([id number]) on 12/10/2013 9:53:34 AM              Kathalene Frames, MD 12/10/13 1128

## 2013-12-10 NOTE — ED Provider Notes (Signed)
CSN: [id number]     Arrival date & time 12/10/13  3536 History   First MD Initiated Contact with Patient 12/10/13 [phone]     Chief Complaint  Patient presents with  . Arm Pain   (Consider location/radiation/quality/duration/timing/severity/associated sxs/prior Treatment) HPI Comments: [name] 63 year old female with PMHx significant for HTN, DM, Arthritis, Chronic neck and back pain and lung cancer who presents to the ED with complaints of left shoulder and arm pain that has been going on "for years" but has worsened over the past several days and is not relieved with current medication regimen of hydrocodone 10/500.  She states that she sees Dr. Tamera Punt with orthopedics for this and that he has injected the shoulder which she reports developed an abscess and did not work.  She states that she continues with pain from her neck to her hands and has developed swelling in the areas as well.  She states she has numbness in her left hand which is not new as well as radiation of the pain into her left anterior chest.  She denies fever, chills, shortness of breath, nausea, vomiting, diaphoresis.  States she has not talked with either Dr. Tamera Punt or Dr. Lynann Bologna about either of these chronic problems.  [name] a 63 y.o. female presenting with arm pain. The history is provided by the patient. No language interpreter was used.  Arm Pain This is a chronic problem. The current episode started in the past 7 days. The problem occurs constantly. The problem has been gradually worsening. Associated symptoms include arthralgias, chest pain, joint swelling, myalgias, neck pain and numbness. Pertinent negatives include no abdominal pain, chills, congestion, coughing, diaphoresis, fatigue, fever, rash, urinary symptoms, vertigo, visual change, vomiting or weakness. The symptoms are aggravated by bending. She has tried nothing for the symptoms. The treatment provided no relief.    Past Medical History  Diagnosis  Date  . Hypertension   . Diabetes mellitus   . Bipolar 1 disorder   . Heart murmur   . Sciatica   . Arthritis   . Anxiety   . Diverticulosis   . Chronic low back pain   . DDD (degenerative disc disease), lumbar   . Chronic shoulder pain   . Chronic neck pain   . Hepatitis B     Unclear when initially diagnosed, labs in Epic from 03/09/13  . Hepatitis C     Unclear when initially diagnosed, labs in Epic from 03/09/13  . C. difficile diarrhea     April and February 2014  . Adenocarcinoma of lung, stage 1   . COPD (chronic obstructive pulmonary disease)   . GERD (gastroesophageal reflux disease)   . H/O hiatal hernia   . XVQMGQQP(619.5)     hx   Past Surgical History  Procedure Laterality Date  . Facial tumor removal Right 2000  . Brain tumor removal  09  . Brain surgery      in [place]  . Video bronchoscopy  12/04/2012    Procedure: VIDEO BRONCHOSCOPY;  Surgeon: Grace Isaac, MD;  Location: [facility];  Service: Thoracic;  Laterality: N/A;  . Video assisted thoracoscopy (vats)/wedge resection  12/04/2012    Procedure: VIDEO ASSISTED THORACOSCOPY (VATS)/WEDGE RESECTION;  Surgeon: Grace Isaac, MD;  Location: [facility];  Service: Thoracic;  Laterality: Right;  . Lobectomy  12/04/2012    Procedure: LOBECTOMY;  Surgeon: Grace Isaac, MD;  Location: [facility];  Service: Thoracic;  Laterality: Right;  completion of right  upper lobectomy and lymph node disection, placement of on q pump  . Tubal ligation    . Tonsillectomy     Family History  Problem Relation Age of Onset  . Hypertension Father     deceased  . Diabetes Father   . Heart disease Father   . Hyperlipidemia Father   . Hypertension Mother   . Hyperlipidemia Mother   . Diabetes Mother   . Hypertension Sister   . Hyperlipidemia Sister   . Hypertension Brother   . Hyperlipidemia Brother    History  Substance Use Topics  . Smoking status: Former Smoker -- 0.25 packs/day for 10 years    Types: Cigarettes     Quit date: 07/07/2013  . Smokeless tobacco: Never Used     Comment: Smokes pk q 3 days   marajuna aug  . Alcohol Use: No     Comment: quit 21yrs ago   OB History   Grav Para Term Preterm Abortions TAB SAB Ect Mult Living                 Review of Systems  Constitutional: Negative for fever, chills, diaphoresis and fatigue.  HENT: Negative for congestion.   Respiratory: Negative for cough.   Cardiovascular: Positive for chest pain.  Gastrointestinal: Negative for vomiting and abdominal pain.  Musculoskeletal: Positive for arthralgias, joint swelling, myalgias and neck pain.  Skin: Negative for rash.  Neurological: Positive for numbness. Negative for vertigo and weakness.  All other systems reviewed and are negative.    Allergies  Haldol; Metronidazole; Morphine and related; and Penicillins  Home Medications   Current Outpatient Rx  Name  Route  Sig  Dispense  Refill  . ALPRAZolam (XANAX) 1 MG tablet   Oral   Take 0.5 mg by mouth 2 (two) times daily as needed for anxiety.          Marland Kitchen aspirin EC 81 MG tablet   Oral   Take 81 mg by mouth daily.         Marland Kitchen dicyclomine (BENTYL) 20 MG tablet   Oral   Take 20 mg by mouth 3 (three) times daily with meals.         . gabapentin (NEURONTIN) 400 MG capsule   Oral   Take 400 mg by mouth 3 (three) times daily.         Marland Kitchen HYDROcodone-acetaminophen (LORTAB) 10-500 MG per tablet   Oral   Take 1 tablet by mouth every 8 (eight) hours as needed for pain.         . Ipratropium-Albuterol (COMBIVENT RESPIMAT) 20-100 MCG/ACT AERS respimat   Inhalation   Inhale 2 puffs into the lungs every 6 (six) hours as needed for shortness of breath.          . loratadine (CLARITIN) 10 MG tablet   Oral   Take 10 mg by mouth every morning.          . metFORMIN (GLUCOPHAGE) 500 MG tablet   Oral   Take 500 mg by mouth 2 (two) times daily with a meal.          . olmesartan-hydrochlorothiazide (BENICAR HCT) 40-12.5 MG per tablet    Oral   Take 1 tablet by mouth daily.         . ondansetron (ZOFRAN-ODT) 4 MG disintegrating tablet   Oral   Take 4 mg by mouth every 6 (six) hours as needed for nausea or vomiting.         Marland Kitchen  simvastatin (ZOCOR) 20 MG tablet   Oral   Take 20 mg by mouth every evening.         . traZODone (DESYREL) 50 MG tablet   Oral   Take 50 mg by mouth at bedtime as needed for sleep.          BP 167/83  Pulse 101  Temp(Src) 98.2 F (36.8 C) (Oral)  Wt 149 lb (67.586 kg)  SpO2 97% Physical Exam  Nursing note and vitals reviewed. Constitutional: She is oriented to person, place, and time. She appears well-developed and well-nourished. No distress.  HENT:  Head: Normocephalic and atraumatic.  Right Ear: External ear normal.  Left Ear: External ear normal.  Nose: Nose normal.  Mouth/Throat: Oropharynx is clear and moist. No oropharyngeal exudate.  Eyes: Conjunctivae are normal. Pupils are equal, round, and reactive to light. No scleral icterus.  Neck: Normal range of motion. Neck supple. Muscular tenderness present.    Cardiovascular: Normal rate, regular rhythm and normal heart sounds.  Exam reveals no gallop and no friction rub.   No murmur heard. Pulmonary/Chest: Effort normal and breath sounds normal. No respiratory distress. She has no wheezes. She has no rales. She exhibits tenderness.    Abdominal: Soft. Bowel sounds are normal. She exhibits no distension. There is no tenderness. There is no rebound and no guarding.  Musculoskeletal: Normal range of motion.       Left shoulder: She exhibits tenderness and bony tenderness. She exhibits normal range of motion, no swelling, no effusion and no deformity.       Left elbow: She exhibits normal range of motion, no swelling and no effusion. Tenderness found. Medial epicondyle, lateral epicondyle and olecranon process tenderness noted.       Left wrist: She exhibits tenderness and bony tenderness. She exhibits normal range of motion,  no swelling, no effusion and no deformity.       Arms: Lymphadenopathy:    She has no cervical adenopathy.  Neurological: She is alert and oriented to person, place, and time. She exhibits normal muscle tone. Coordination normal.  Subjective parethesias to left hand  Skin: Skin is warm and dry. No rash noted. No erythema. No pallor.  Psychiatric: She has a normal mood and affect. Her behavior is normal. Judgment and thought content normal.    ED Course  Procedures (including critical care time) Labs Review Labs Reviewed  CBC WITH DIFFERENTIAL   Imaging Review Dg Chest 2 View  12/10/2013   CLINICAL DATA:  Left arm and left-sided chest pain.  EXAM: CHEST  2 VIEW  COMPARISON:  Chest x-ray 08/07/2013.  FINDINGS: Lung volumes are normal. No consolidative airspace disease. No pleural effusions. No pneumothorax. No pulmonary nodule or mass noted. Pulmonary vasculature and the cardiomediastinal silhouette are within normal limits.  IMPRESSION: 1.  No radiographic evidence of acute cardiopulmonary disease.   Electronically Signed   By: Vinnie Langton M.D.   On: 12/10/2013 10:38    EKG Interpretation    Date/Time:  Wednesday December 10 2013 09:48:42 EST Ventricular Rate:  80 PR Interval:  222 QRS Duration: 75 QT Interval:  370 QTC Calculation: 427 R Axis:   60 Text Interpretation:  Sinus rhythm Prolonged PR interval Consider left atrial enlargement No significant change since last tracing Confirmed by KNAPP  MD-J, JON ([id number]) on 12/10/2013 9:53:34 AM          11:25 AM Care of the patient has been turned over to A. Kenton Kingfisher, PA-C who will disposition  the patient.  MDM      Idalia Needle Joelyn Oms, PA-C 12/10/13 1126

## 2013-12-23 ENCOUNTER — Ambulatory Visit: Payer: Medicare Other | Attending: Orthopedic Surgery | Admitting: Physical Therapy

## 2013-12-23 ENCOUNTER — Other Ambulatory Visit: Payer: Self-pay | Admitting: *Deleted

## 2013-12-23 DIAGNOSIS — M545 Low back pain, unspecified: Secondary | ICD-10-CM | POA: Insufficient documentation

## 2013-12-23 DIAGNOSIS — I10 Essential (primary) hypertension: Secondary | ICD-10-CM | POA: Insufficient documentation

## 2013-12-23 DIAGNOSIS — IMO0001 Reserved for inherently not codable concepts without codable children: Secondary | ICD-10-CM | POA: Insufficient documentation

## 2013-12-23 DIAGNOSIS — C3490 Malignant neoplasm of unspecified part of unspecified bronchus or lung: Secondary | ICD-10-CM

## 2013-12-23 DIAGNOSIS — E119 Type 2 diabetes mellitus without complications: Secondary | ICD-10-CM | POA: Insufficient documentation

## 2013-12-23 DIAGNOSIS — R5381 Other malaise: Secondary | ICD-10-CM | POA: Insufficient documentation

## 2013-12-26 ENCOUNTER — Ambulatory Visit: Payer: Medicare Other | Admitting: Physical Therapy

## 2013-12-29 ENCOUNTER — Ambulatory Visit: Payer: Medicare Other | Admitting: Physical Therapy

## 2014-01-02 ENCOUNTER — Ambulatory Visit: Payer: Medicare Other | Admitting: Physical Therapy

## 2014-01-06 ENCOUNTER — Ambulatory Visit: Payer: Medicare Other | Admitting: Physical Therapy

## 2014-01-13 ENCOUNTER — Ambulatory Visit: Payer: Medicare Other | Admitting: Physical Therapy

## 2014-01-14 ENCOUNTER — Ambulatory Visit: Payer: Medicare Other | Admitting: Physical Therapy

## 2014-01-15 ENCOUNTER — Ambulatory Visit: Payer: Medicare Other | Admitting: Cardiothoracic Surgery

## 2014-01-15 ENCOUNTER — Other Ambulatory Visit: Payer: Self-pay

## 2014-01-19 ENCOUNTER — Ambulatory Visit: Payer: Medicare Other | Attending: Orthopedic Surgery | Admitting: Physical Therapy

## 2014-01-19 DIAGNOSIS — I10 Essential (primary) hypertension: Secondary | ICD-10-CM | POA: Insufficient documentation

## 2014-01-19 DIAGNOSIS — M545 Low back pain, unspecified: Secondary | ICD-10-CM | POA: Insufficient documentation

## 2014-01-19 DIAGNOSIS — E119 Type 2 diabetes mellitus without complications: Secondary | ICD-10-CM | POA: Insufficient documentation

## 2014-01-19 DIAGNOSIS — IMO0001 Reserved for inherently not codable concepts without codable children: Secondary | ICD-10-CM | POA: Insufficient documentation

## 2014-01-19 DIAGNOSIS — R5381 Other malaise: Secondary | ICD-10-CM | POA: Insufficient documentation

## 2014-01-21 ENCOUNTER — Ambulatory Visit: Payer: Medicare Other | Admitting: Physical Therapy

## 2014-01-23 ENCOUNTER — Ambulatory Visit: Payer: Medicare Other | Admitting: Physical Therapy

## 2014-01-28 ENCOUNTER — Ambulatory Visit: Payer: Medicare Other | Admitting: Physical Therapy

## 2014-01-30 ENCOUNTER — Ambulatory Visit: Payer: Medicare Other | Admitting: Physical Therapy

## 2014-02-05 ENCOUNTER — Ambulatory Visit: Payer: Medicare Other | Admitting: Cardiothoracic Surgery

## 2014-02-05 ENCOUNTER — Other Ambulatory Visit: Payer: Self-pay

## 2014-02-07 ENCOUNTER — Observation Stay (HOSPITAL_COMMUNITY)
Admission: EM | Admit: 2014-02-07 | Discharge: 2014-02-08 | Disposition: A | Discharge status: Home / Self Care | Payer: Medicare Other | Attending: Internal Medicine | Admitting: Internal Medicine

## 2014-02-07 ENCOUNTER — Emergency Department (HOSPITAL_COMMUNITY): Payer: Medicare Other

## 2014-02-07 ENCOUNTER — Encounter (HOSPITAL_COMMUNITY): Payer: Self-pay | Admitting: Emergency Medicine

## 2014-02-07 DIAGNOSIS — F319 Bipolar disorder, unspecified: Secondary | ICD-10-CM | POA: Insufficient documentation

## 2014-02-07 DIAGNOSIS — R197 Diarrhea, unspecified: Secondary | ICD-10-CM | POA: Insufficient documentation

## 2014-02-07 DIAGNOSIS — I10 Essential (primary) hypertension: Secondary | ICD-10-CM | POA: Diagnosis present

## 2014-02-07 DIAGNOSIS — B181 Chronic viral hepatitis B without delta-agent: Secondary | ICD-10-CM | POA: Insufficient documentation

## 2014-02-07 DIAGNOSIS — R1032 Left lower quadrant pain: Principal | ICD-10-CM | POA: Insufficient documentation

## 2014-02-07 DIAGNOSIS — B1920 Unspecified viral hepatitis C without hepatic coma: Secondary | ICD-10-CM | POA: Insufficient documentation

## 2014-02-07 DIAGNOSIS — Z87891 Personal history of nicotine dependence: Secondary | ICD-10-CM | POA: Insufficient documentation

## 2014-02-07 DIAGNOSIS — M545 Low back pain, unspecified: Secondary | ICD-10-CM | POA: Insufficient documentation

## 2014-02-07 DIAGNOSIS — E119 Type 2 diabetes mellitus without complications: Secondary | ICD-10-CM | POA: Insufficient documentation

## 2014-02-07 DIAGNOSIS — Z7982 Long term (current) use of aspirin: Secondary | ICD-10-CM | POA: Insufficient documentation

## 2014-02-07 DIAGNOSIS — Z902 Acquired absence of lung [part of]: Secondary | ICD-10-CM | POA: Insufficient documentation

## 2014-02-07 DIAGNOSIS — C3490 Malignant neoplasm of unspecified part of unspecified bronchus or lung: Secondary | ICD-10-CM

## 2014-02-07 DIAGNOSIS — M5137 Other intervertebral disc degeneration, lumbosacral region: Secondary | ICD-10-CM | POA: Insufficient documentation

## 2014-02-07 DIAGNOSIS — F419 Anxiety disorder, unspecified: Secondary | ICD-10-CM | POA: Diagnosis present

## 2014-02-07 DIAGNOSIS — R112 Nausea with vomiting, unspecified: Secondary | ICD-10-CM

## 2014-02-07 DIAGNOSIS — R109 Unspecified abdominal pain: Secondary | ICD-10-CM | POA: Diagnosis present

## 2014-02-07 DIAGNOSIS — Z8619 Personal history of other infectious and parasitic diseases: Secondary | ICD-10-CM

## 2014-02-07 DIAGNOSIS — G8929 Other chronic pain: Secondary | ICD-10-CM | POA: Insufficient documentation

## 2014-02-07 DIAGNOSIS — J4489 Other specified chronic obstructive pulmonary disease: Secondary | ICD-10-CM | POA: Insufficient documentation

## 2014-02-07 DIAGNOSIS — J449 Chronic obstructive pulmonary disease, unspecified: Secondary | ICD-10-CM | POA: Insufficient documentation

## 2014-02-07 DIAGNOSIS — M51379 Other intervertebral disc degeneration, lumbosacral region without mention of lumbar back pain or lower extremity pain: Secondary | ICD-10-CM | POA: Insufficient documentation

## 2014-02-07 DIAGNOSIS — Z85118 Personal history of other malignant neoplasm of bronchus and lung: Secondary | ICD-10-CM | POA: Insufficient documentation

## 2014-02-07 DIAGNOSIS — K449 Diaphragmatic hernia without obstruction or gangrene: Secondary | ICD-10-CM | POA: Insufficient documentation

## 2014-02-07 DIAGNOSIS — K219 Gastro-esophageal reflux disease without esophagitis: Secondary | ICD-10-CM | POA: Insufficient documentation

## 2014-02-07 DIAGNOSIS — F411 Generalized anxiety disorder: Secondary | ICD-10-CM | POA: Insufficient documentation

## 2014-02-07 LAB — COMPREHENSIVE METABOLIC PANEL
ALK PHOS: 95 U/L (ref 39–117)
ALT: 14 U/L (ref 0–35)
AST: 21 U/L (ref 0–37)
Albumin: 4.1 g/dL (ref 3.5–5.2)
BILIRUBIN TOTAL: 0.5 mg/dL (ref 0.3–1.2)
BUN: 22 mg/dL (ref 6–23)
CO2: 26 meq/L (ref 19–32)
Calcium: 9.7 mg/dL (ref 8.4–10.5)
Chloride: 103 mEq/L (ref 96–112)
Creatinine, Ser: 0.5 mg/dL (ref 0.50–1.10)
GFR calc Af Amer: >90 mL/min (ref >90)
GFR calc non Af Amer: >90 mL/min (ref >90)
GLUCOSE: 92 mg/dL (ref 70–99)
Potassium: 4 mEq/L (ref 3.7–5.3)
SODIUM: 142 meq/L (ref 137–147)
Total Protein: 7.4 g/dL (ref 6.0–8.3)

## 2014-02-07 LAB — URINALYSIS, ROUTINE W REFLEX MICROSCOPIC
Bilirubin Urine: NEGATIVE
GLUCOSE, UA: NEGATIVE mg/dL
HGB URINE DIPSTICK: NEGATIVE
Ketones, ur: NEGATIVE mg/dL
Nitrite: NEGATIVE
PH: 7.5 (ref 5.0–8.0)
Protein, ur: NEGATIVE mg/dL
SPECIFIC GRAVITY, URINE: 1.03 (ref 1.005–1.030)
Urobilinogen, UA: 1 mg/dL (ref 0.0–1.0)

## 2014-02-07 LAB — CBC WITH DIFFERENTIAL/PLATELET
Basophils Absolute: 0 10*3/uL (ref 0.0–0.1)
Basophils Relative: 1 % (ref 0–1)
EOS PCT: 0 % (ref 0–5)
Eosinophils Absolute: 0 10*3/uL (ref 0.0–0.7)
HCT: 40.3 % (ref 36.0–46.0)
HEMOGLOBIN: 13.4 g/dL (ref 12.0–15.0)
LYMPHS ABS: 2.3 10*3/uL (ref 0.7–4.0)
Lymphocytes Relative: 46 % (ref 12–46)
MCH: 29 pg (ref 26.0–34.0)
MCHC: 33.3 g/dL (ref 30.0–36.0)
MCV: 87.2 fL (ref 78.0–100.0)
MONOS PCT: 5 % (ref 3–12)
Monocytes Absolute: 0.3 10*3/uL (ref 0.1–1.0)
NEUTROS PCT: 47 % (ref 43–77)
Neutro Abs: 2.3 10*3/uL (ref 1.7–7.7)
Platelets: 251 10*3/uL (ref 150–400)
RBC: 4.62 MIL/uL (ref 3.87–5.11)
RDW: 13.8 % (ref 11.5–15.5)
WBC: 4.9 10*3/uL (ref 4.0–10.5)

## 2014-02-07 LAB — URINE MICROSCOPIC-ADD ON

## 2014-02-07 LAB — GLUCOSE, CAPILLARY: Glucose-Capillary: 102 mg/dL — ABNORMAL HIGH (ref 70–99)

## 2014-02-07 MED ORDER — IPRATROPIUM-ALBUTEROL 0.5-2.5 (3) MG/3ML IN SOLN: 3.0000 mL | Freq: Every day | RESPIRATORY_TRACT | Status: DC | PRN

## 2014-02-07 MED ORDER — SIMVASTATIN 20 MG PO TABS
20.0000 mg | ORAL_TABLET | Freq: Every day | ORAL | Status: DC
  Administered 2014-02-07: 20 mg via ORAL
  Filled 2014-02-07 (×2): qty 1

## 2014-02-07 MED ORDER — METHYLPREDNISOLONE 4 MG PO KIT: 4.0000 mg | PACK | Freq: Four times a day (QID) | ORAL | Status: DC

## 2014-02-07 MED ORDER — ALPRAZOLAM 0.5 MG PO TABS
0.5000 mg | ORAL_TABLET | Freq: Two times a day (BID) | ORAL | Status: DC
  Administered 2014-02-07 – 2014-02-08 (×2): 0.5 mg via ORAL
  Filled 2014-02-07 (×2): qty 1

## 2014-02-07 MED ORDER — ONDANSETRON HCL 4 MG/2ML IJ SOLN
4.0000 mg | Freq: Three times a day (TID) | INTRAMUSCULAR | Status: AC | PRN
Start: 2014-02-07 — End: 2014-02-08
  Administered 2014-02-07: 4 mg via INTRAVENOUS
  Filled 2014-02-07: qty 2

## 2014-02-07 MED ORDER — GABAPENTIN 400 MG PO CAPS
400.0000 mg | ORAL_CAPSULE | Freq: Three times a day (TID) | ORAL | Status: DC
  Administered 2014-02-07 – 2014-02-08 (×2): 400 mg via ORAL
  Filled 2014-02-07 (×4): qty 1

## 2014-02-07 MED ORDER — ENOXAPARIN SODIUM 40 MG/0.4ML ~~LOC~~ SOLN
40.0000 mg | Freq: Every day | SUBCUTANEOUS | Status: DC
  Administered 2014-02-07: 40 mg via SUBCUTANEOUS
  Filled 2014-02-07 (×2): qty 0.4

## 2014-02-07 MED ORDER — HYDROCHLOROTHIAZIDE 25 MG PO TABS
25.0000 mg | ORAL_TABLET | Freq: Every day | ORAL | Status: DC
  Administered 2014-02-08: 25 mg via ORAL
  Filled 2014-02-07: qty 1

## 2014-02-07 MED ORDER — METHYLPREDNISOLONE 4 MG PO KIT
4.0000 mg | PACK | Freq: Three times a day (TID) | ORAL | Status: DC
  Administered 2014-02-08 (×2): 4 mg via ORAL

## 2014-02-07 MED ORDER — OXCARBAZEPINE 150 MG PO TABS
150.0000 mg | ORAL_TABLET | Freq: Three times a day (TID) | ORAL | Status: DC
  Administered 2014-02-07 – 2014-02-08 (×2): 150 mg via ORAL
  Filled 2014-02-07 (×4): qty 1

## 2014-02-07 MED ORDER — SODIUM CHLORIDE 0.9 % IV BOLUS (SEPSIS)
1000.0000 mL | Freq: Once | INTRAVENOUS | Status: AC
  Administered 2014-02-07: 1000 mL via INTRAVENOUS

## 2014-02-07 MED ORDER — FENTANYL CITRATE 0.05 MG/ML IJ SOLN
50.0000 ug | Freq: Once | INTRAMUSCULAR | Status: DC
  Filled 2014-02-07: qty 2

## 2014-02-07 MED ORDER — IOHEXOL 300 MG/ML  SOLN
25.0000 mL | Freq: Once | INTRAMUSCULAR | Status: AC | PRN
  Administered 2014-02-07: 25 mL via ORAL

## 2014-02-07 MED ORDER — METHYLPREDNISOLONE (PAK) 4 MG PO TABS: 4.0000 mg | ORAL_TABLET | ORAL | Status: DC

## 2014-02-07 MED ORDER — METOPROLOL TARTRATE 25 MG PO TABS
25.0000 mg | ORAL_TABLET | Freq: Two times a day (BID) | ORAL | Status: DC
  Administered 2014-02-07 – 2014-02-08 (×2): 25 mg via ORAL
  Filled 2014-02-07 (×3): qty 1

## 2014-02-07 MED ORDER — HYDROMORPHONE HCL PF 1 MG/ML IJ SOLN
0.5000 mg | INTRAMUSCULAR | Status: DC | PRN
  Administered 2014-02-07 – 2014-02-08 (×4): 0.5 mg via INTRAVENOUS
  Filled 2014-02-07 (×4): qty 1

## 2014-02-07 MED ORDER — IOHEXOL 300 MG/ML  SOLN
100.0000 mL | Freq: Once | INTRAMUSCULAR | Status: AC | PRN
  Administered 2014-02-07: 100 mL via INTRAVENOUS

## 2014-02-07 MED ORDER — METHOCARBAMOL 500 MG PO TABS: 500.0000 mg | ORAL_TABLET | Freq: Three times a day (TID) | ORAL | Status: DC | PRN

## 2014-02-07 MED ORDER — SODIUM CHLORIDE 0.9 % IV SOLN
INTRAVENOUS | Status: AC
  Administered 2014-02-07: 23:00:00 via INTRAVENOUS

## 2014-02-07 MED ORDER — INSULIN ASPART 100 UNIT/ML ~~LOC~~ SOLN
0.0000 [IU] | Freq: Three times a day (TID) | SUBCUTANEOUS | Status: DC
  Administered 2014-02-08: 1 [IU] via SUBCUTANEOUS

## 2014-02-07 MED ORDER — SIMETHICONE 40 MG/0.6ML PO SUSP
40.0000 mg | Freq: Four times a day (QID) | ORAL | Status: DC | PRN
  Filled 2014-02-07: qty 0.6

## 2014-02-07 MED ORDER — METHYLPREDNISOLONE 4 MG PO KIT
8.0000 mg | PACK | Freq: Every evening | ORAL | Status: AC
Start: 2014-02-07 — End: 2014-02-08
  Administered 2014-02-08: 8 mg via ORAL
  Filled 2014-02-07: qty 21

## 2014-02-07 MED ORDER — METHYLPREDNISOLONE 4 MG PO KIT: 8.0000 mg | PACK | Freq: Every evening | ORAL | Status: DC

## 2014-02-07 MED ORDER — IRBESARTAN 300 MG PO TABS
300.0000 mg | ORAL_TABLET | Freq: Every day | ORAL | Status: DC
  Administered 2014-02-08: 300 mg via ORAL
  Filled 2014-02-07: qty 1

## 2014-02-07 MED ORDER — FENTANYL CITRATE 0.05 MG/ML IJ SOLN
50.0000 ug | Freq: Once | INTRAMUSCULAR | Status: AC
  Administered 2014-02-07: 50 ug via INTRAVENOUS
  Filled 2014-02-07: qty 2

## 2014-02-07 MED ORDER — DICYCLOMINE HCL 20 MG PO TABS
20.0000 mg | ORAL_TABLET | Freq: Three times a day (TID) | ORAL | Status: DC
  Administered 2014-02-08 (×2): 20 mg via ORAL
  Filled 2014-02-07 (×4): qty 1

## 2014-02-07 MED ORDER — OLMESARTAN MEDOXOMIL-HCTZ 40-25 MG PO TABS: 1.0000 | ORAL_TABLET | Freq: Every day | ORAL | Status: DC

## 2014-02-07 MED ORDER — LORATADINE 10 MG PO TABS
10.0000 mg | ORAL_TABLET | Freq: Every day | ORAL | Status: DC
  Administered 2014-02-08: 10 mg via ORAL
  Filled 2014-02-07: qty 1

## 2014-02-07 MED ORDER — ASPIRIN EC 81 MG PO TBEC
81.0000 mg | DELAYED_RELEASE_TABLET | ORAL | Status: DC
  Administered 2014-02-08: 81 mg via ORAL
  Filled 2014-02-07: qty 1

## 2014-02-07 NOTE — ED Notes (Signed)
Pt reports history of IBS. States that diarrhea and constipation have worsened in last two weeks. States that the diarrhea looks and smells like when she had C. Diff in the past. Pt complaining of left lower abdominal pain.

## 2014-02-07 NOTE — ED Notes (Signed)
Pt finished drinking her oral CT contrast. CT has been notified.

## 2014-02-07 NOTE — H&P (Signed)
Date: 02/07/2014               Patient Name:  [name] [name] MRN: [id number]  DOB: November 06, 1951 Age / Sex: 63 y.o., female   PCP: Harvie Junior, MD         Medical Service: Internal Medicine Teaching Service         Attending Physician: Dr. Malvin Johns, MD    First Contact: Dr. Lesly Dukes Pager: [phone]  Second Contact: Dr. Adele Barthel Pager: [phone]       After Hours (After 5p/  First Contact Pager: [phone]  weekends / holidays): Second Contact Pager: [phone]   Chief Complaint: diarrhea and LLQ pain  History of Present Illness:  Ms. Prieur is a 63 year old woman with a PMH of HTN, DM type 2 (Hgb A1c 6.18 February 2013), HBV, HCV, lung adenocarcinoma (s/p right upper lobectomy, no chemo or XRT), bipolar disorder, anxiety, IBS and hx of diverticulitis and c diff colitis.  She presents with c/o of diarrhea and LLQ pain.  She has a history of diarrhea related to IBS but reports increasing symptoms for the past month.  She notes almost daily diarrhea but sometimes every other day.  During the course of the day she may have about six BMs.  Her stools are non-bloody, non-melanous and range in color from green to Dancy.  Though she has frequent loose stools she also notes the occurrence of hard stools during the day as well.  She often has the sensation of incomplete evacuation as well as gas and bloating.  She denies rectal pain with defecation or hematochezia.  She denies recent antibiotic use, new foods or sick contacts.  Her only new medication is a Medrol DosePak which she just started today.  She also notes LLQ pain x 1 week.  The pain is described as an intermittent "pulling" sensation that is 10/10 at worse and increases after BMs.  Laying on her left side makes the pain somewhat better.  She takes Lortab for back pain and says it also helps this abdominal pain.  She has had decreased po since her IBS diagnosis because she says there are certain foods that aggravate her IBS and so  she has a limited diet.  She has subjective fevers but denies N/V.  In the ED:  T 98.33F, RR 16, SpO2 97%, HR 89, BP 151/61mmHg; she received 1L NSS bolus and 63mcg Fentanyl.  Meds: No current facility-administered medications for this encounter.   Current Outpatient Prescriptions  Medication Sig Dispense Refill  . ALPRAZolam (XANAX) 1 MG tablet Take 0.5 mg by mouth 2 (two) times daily.       Marland Kitchen alum & mag hydroxide-simeth (MAALOX/MYLANTA) 200-200-20 MG/5ML suspension Take by mouth 3 (three) times daily as needed for indigestion or heartburn (stomach pain).      Marland Kitchen aspirin EC 81 MG tablet Take 81 mg by mouth every other day.       . Cholecalciferol (VITAMIN D PO) Take 1 tablet by mouth daily.      . Cyanocobalamin (VITAMIN B-12 IJ) Inject as directed every 30 (thirty) days. Administered by Dr. York Ram between the 26th and 29th of each month      . dicyclomine (BENTYL) 20 MG tablet Take 20 mg by mouth 3 (three) times daily with meals.      . gabapentin (NEURONTIN) 400 MG capsule Take 400 mg by mouth 3 (three) times daily. Nerve pain      . Ipratropium-Albuterol (COMBIVENT  RESPIMAT) 20-100 MCG/ACT AERS respimat Inhale 2 puffs into the lungs daily as needed for wheezing or shortness of breath.       . loratadine (CLARITIN) 10 MG tablet Take 10 mg by mouth daily.       . Menthol-Methyl Salicylate (MUSCLE RUB) 10-15 % CREA Apply 1 application topically every other day. Left shoulder pain      . metFORMIN (GLUCOPHAGE) 500 MG tablet Take 500 mg by mouth 2 (two) times daily with a meal.       . methocarbamol (ROBAXIN) 500 MG tablet Take 500 mg by mouth every 8 (eight) hours as needed for muscle spasms.       . methylPREDNIsolone (MEDROL DOSPACK) 4 MG tablet Take 4 mg by mouth as directed. Tapered course per package directions - started 02/07/14      . metoprolol tartrate (LOPRESSOR) 25 MG tablet Take 25 mg by mouth 2 (two) times daily.       Marland Kitchen olmesartan-hydrochlorothiazide (BENICAR HCT) 40-25 MG  per tablet Take 1 tablet by mouth daily.      Marland Kitchen omeprazole (PRILOSEC) 20 MG capsule Take 20 mg by mouth 2 (two) times daily before a meal.       . OXcarbazepine (TRILEPTAL) 150 MG tablet Take 150 mg by mouth 3 (three) times daily.       . simvastatin (ZOCOR) 20 MG tablet Take 20 mg by mouth at bedtime.         Allergies: Allergies as of 02/07/2014 - Review Complete 02/07/2014  Allergen Reaction Noted  . Haldol [haloperidol decanoate] Other (See Comments) 11/09/2011  . Acetaminophen Other (See Comments) 02/07/2014  . Ibuprofen Other (See Comments) 02/07/2014  . Metronidazole Other (See Comments) 05/21/2013  . Morphine and related Hives 11/09/2011  . Penicillins Rash 10/08/2012   Past Medical History  Diagnosis Date  . Hypertension   . Diabetes mellitus   . Bipolar 1 disorder   . Heart murmur   . Sciatica   . Arthritis   . Anxiety   . Diverticulosis   . Chronic low back pain   . DDD (degenerative disc disease), lumbar   . Chronic shoulder pain   . Chronic neck pain   . Hepatitis B     Unclear when initially diagnosed, labs in Epic from 03/09/13  . Hepatitis C     Unclear when initially diagnosed, labs in Epic from 03/09/13  . C. difficile diarrhea     April and February 2014  . Adenocarcinoma of lung, stage 1   . COPD (chronic obstructive pulmonary disease)   . GERD (gastroesophageal reflux disease)   . H/O hiatal hernia   . SAYTKZSW(109.3)     hx   Past Surgical History  Procedure Laterality Date  . Facial tumor removal Right 2000  . Brain tumor removal  09  . Brain surgery      in [place]  . Video bronchoscopy  12/04/2012    Procedure: VIDEO BRONCHOSCOPY;  Surgeon: Grace Isaac, MD;  Location: [facility] OR;  Service: Thoracic;  Laterality: N/A;  . Video assisted thoracoscopy (vats)/wedge resection  12/04/2012    Procedure: VIDEO ASSISTED THORACOSCOPY (VATS)/WEDGE RESECTION;  Surgeon: Grace Isaac, MD;  Location: [place];  Service: Thoracic;  Laterality: Right;    . Lobectomy  12/04/2012    Procedure: LOBECTOMY;  Surgeon: Grace Isaac, MD;  Location: [place];  Service: Thoracic;  Laterality: Right;  completion of right upper lobectomy and lymph node disection, placement of on  q pump  . Tubal ligation    . Tonsillectomy     Family History  Problem Relation Age of Onset  . Hypertension Father     deceased  . Diabetes Father   . Heart disease Father   . Hyperlipidemia Father   . Hypertension Mother   . Hyperlipidemia Mother   . Diabetes Mother   . Hypertension Sister   . Hyperlipidemia Sister   . Hypertension Brother   . Hyperlipidemia Brother    History   Social History  . Marital Status: Widowed    Spouse Name: N/A    Number of Children: N/A  . Years of Education: N/A   Occupational History  . n/a     patient draws SNN/SS[employer]   Social History Main Topics  . Smoking status: Former Smoker -- 0.25 packs/day for 10 years    Types: Cigarettes    Quit date: 07/07/2013  . Smokeless tobacco: Never Used     Comment: Smokes pk q 3 days   marajuna aug  . Alcohol Use: No     Comment: quit 25yrs ago  . Drug Use: Yes    Special: Hydrocodone, Marijuana     Comment: "maybe about once a month"  . Sexual Activity: No   Other Topics Concern  . Not on file   Social History Narrative  . No narrative on file    Review of Systems: Pertinent items are noted in HPI. HEENT:  Denies change in vision or hearing Cardiopulm:  Denies chest pain, dyspnea or leg swelling GI:  Decreased po due to IBS, see HPI GU:  Denies dysuria  Physical Exam: Blood pressure 148/66, pulse 78, temperature 98.2 F (36.8 C), temperature source Oral, resp. rate 18, height 5\' 4"  (1.626 m), weight 148 lb 1 oz (67.161 kg), SpO2 97.00%. General: resting in bed in NAD HEENT: PERRL, EOMI, oropharynx clear and moist Cardiac: RRR, no rubs, murmurs or gallops Pulm: clear to auscultation bilaterally, moving normal volumes of air Abd: soft, nondistended, BS present in all  four quadrants; no rebound, rigidity or guarding, + LLQ TTP, left flank tenderness Ext: warm and well perfused, no pedal edema, 2+ DPs Neuro: alert and oriented X3, cranial nerves II-XII grossly intact, 5/5 MMS upper and lower extremities, moving all four extremities voluntarily  Lab results: Basic Metabolic Panel:  Recent Labs  02/07/14 1704  NA 142  K 4.0  CL 103  CO2 26  GLUCOSE 92  BUN 22  CREATININE 0.50  CALCIUM 9.7   Liver Function Tests:  Recent Labs  02/07/14 1704  AST 21  ALT 14  ALKPHOS 95  BILITOT 0.5  PROT 7.4  ALBUMIN 4.1   Lipase 84  Lactic acid 1.9  CBC:  Recent Labs  02/07/14 1704  WBC 4.9  NEUTROABS 2.3  HGB 13.4  HCT 40.3  MCV 87.2  PLT 251    Urinalysis:  Recent Labs  02/07/14 1259  COLORURINE YELLOW  LABSPEC 1.030  PHURINE 7.5  GLUCOSEU NEGATIVE  HGBUR NEGATIVE  BILIRUBINUR NEGATIVE  KETONESUR NEGATIVE  PROTEINUR NEGATIVE  UROBILINOGEN 1.0  NITRITE NEGATIVE  LEUKOCYTESUR TRACE*   Imaging results:  Ct Abdomen Pelvis W Contrast  02/07/2014   CLINICAL DATA:  Left-sided pain  EXAM: CT ABDOMEN AND PELVIS WITH CONTRAST  TECHNIQUE: Multidetector CT imaging of the abdomen and pelvis was performed using the standard protocol following bolus administration of intravenous contrast.  CONTRAST:  142mL OMNIPAQUE IOHEXOL 300 MG/ML  SOLN  COMPARISON:  CT ABD/PELVIS  W CM dated 10/01/2013  FINDINGS: Lung bases are clear.  No pericardial fluid.  The common bile duct is prominent at 6 but unchanged from prior.  No focal hepatic lesion. Gallbladder, pancreas, spleen, adrenal glands, kidneys are normal.  The stomach, small bowel, appendix, cecum are normal. Colon and rectosigmoid colon are normal.  Abdominal aorta is normal caliber. No retroperitoneal periportal lymphadenopathy.  No free fluid the pelvis. The uterus and ovaries are normal. No pelvic lymphadenopathy. Posterior lumbar fusion noted anterolisthesis of L5 on S1 is not changed from prior.   IMPRESSION: 1. No acute abdominal pelvic findings. 2. Dilatation of the common bile duct is similar to comparison exam.   Electronically Signed   By: Suzy Bouchard M.D.   On: 02/07/2014 20:08    Assessment & Plan by Problem: 63 year old woman with a PMH of HTN, DM type 2 (Hgb A1c 6.18 February 2013), HBV, HCV, lung adenocarcinoma (s/p right upper lobectomy, no chemo or XRT), bipolar disorder, anxiety, IBS and hx of diverticulitis and c diff colitis with c/o of diarrhea and LLQ pain.  LLQ pain and diarrhea:  Differential includes IBS vs diverticulitis vs cdiff colitis vs ischemic colitis vs nephrolithiasis.  Doubt c diff since the patient reports diarrhea alternating with hard stools and she has had no recent antibiotic use or hospital admission.  Ischemic colitis is less likely as she has no hematochezia or lactic acidosis.  She has tender left flank, however doubt renal source of pain given UA negative for infection and no CT evidence of nephrolithiasis.  Slightly elevated lipase, 84 but would expect 2-3x normal elevation in acute pancreatitis.  Furthermore, she has no epigastric/LUQ pain and no CT evidence of pancreatitis.  Diverticulitis is possible given the location of her pain and her hx of diverticulitis, however she lacks fever, leukocytosis and had a negative CT.  The patient's c/o of alternating diarrhea and constipations, feeling of incomplete emptying and bloating are more consistent with IBS and she says these symptoms are consistent with her IBS but she feels they have gotten worse in the past month.   - admit for observation - continue Bentyl - antispasmodic - simethicone prn - antigas - Zofran prn - antinausea - will not give antidiarrheal at this point b/c of alternating constipation - c diff toxin ordered in ED, follow-up results  HTN: Stable.  Home medications are Lopressor 25mg  daily, olmesartan-HCTZ 40/25mg  daily - irbesartan 300mg  daily - HCTZ 25mg  daily   DM type 2:  Stable.   HgbA1c 6.1 in April 2014.  Home medication is metformin. - SSI sensitive  Adenocarcinoma of the lung:  The patient is s/p right upper lobectomy with lymph node sampling.  Path stage 1A (T1a,N0,cM0).  No chemo or XRT was required.    Cervical spine dx:  The patient has a Medrol dosepak with her that she says was prescribed for cervical spine dx.  She just started the pack today but only took 1 pill.  She says she takes Lortab 10/325 q8h daily for chronic pain. - continue steroid taper - continue home medication, Robaxin prn - 0.5mg  IV Dilaudid q3h prn  Bipolar d/o:  Appears stable.  Continue oxcarbazepine.  Anxiety:  Stable.  Continue home medication, Xanax 0.5mg  BID.  Diet: carb modified  VTE ppx:  lovenox   Dispo: Disposition is deferred at this time, awaiting improvement of current medical problems. Anticipated discharge in approximately 1-2 day(s).   The patient does have a current PCP (Dwight Carmelia Roller,  MD) and does not need an [facility] hospital follow-up appointment after discharge.  The patient does not know have transportation limitations that hinder transportation to clinic appointments.  Signed: Duwaine Maxin, DO 02/07/2014, 8:46 PM

## 2014-02-07 NOTE — ED Notes (Signed)
Attempted PIV start x 2 with no success. Paged IV team for IV start.

## 2014-02-07 NOTE — ED Notes (Signed)
IV Nurse at bedside starting PIV.

## 2014-02-07 NOTE — ED Notes (Addendum)
Pt presents with left sided abd pain x 3 days. Pt describes it as "soreness" and "cramping"  Pt states that she facilitates between diarrhea and constipation for "Months" pt denies any recent illnesses or vomitting ort chest pain. Condition is made worse by walking and laying down. Condition is made better by "pain pills and muscle relaxers" pt reports pain with urination and increase in frequency X " a couple of weeks"

## 2014-02-07 NOTE — ED Notes (Signed)
IV nurse unable to start PIV. Getting another IV nurse to try again.

## 2014-02-07 NOTE — ED Notes (Signed)
Admitting MD at bedside.

## 2014-02-07 NOTE — ED Provider Notes (Signed)
CSN: [id number]     Arrival date & time 02/07/14  1233 History   First MD Initiated Contact with Patient 02/07/14 1557     Chief Complaint  Patient presents with  . Abdominal Pain  . Dysuria     (Consider location/radiation/quality/duration/timing/severity/associated sxs/prior Treatment) HPI Comments: [name] [name] is a 63 y.o. female with a past medical history of DM, C-diff, lung Cancer presenting the Emergency Department with a chief complaint of worsening Left lower abdominal pain for three days.  The patient reports constant non-radiating discomfort described as "pulling".  She reports similar episodes in the past, reports in 1 episode a month for several months.  She reports non-bloody, no pus seen on BM,intermentant loose stool with normal solid stool. She does reports foul smelling green stool, similar to when she had c-diff. Reports nausea without emesis.   PCP: Harvie Junior, MD    The history is provided by the patient and medical records. No language interpreter was used.    Past Medical History  Diagnosis Date  . Hypertension   . Diabetes mellitus   . Bipolar 1 disorder   . Heart murmur   . Sciatica   . Arthritis   . Anxiety   . Diverticulosis   . Chronic low back pain   . DDD (degenerative disc disease), lumbar   . Chronic shoulder pain   . Chronic neck pain   . Hepatitis B     Unclear when initially diagnosed, labs in Epic from 03/09/13  . Hepatitis C     Unclear when initially diagnosed, labs in Epic from 03/09/13  . C. difficile diarrhea     April and February 2014  . Adenocarcinoma of lung, stage 1   . COPD (chronic obstructive pulmonary disease)   . GERD (gastroesophageal reflux disease)   . H/O hiatal hernia   . BWLSLHTD(428.7)     hx   Past Surgical History  Procedure Laterality Date  . Facial tumor removal Right 2000  . Brain tumor removal  09  . Brain surgery      in [place]  . Video bronchoscopy  12/04/2012    Procedure: VIDEO  BRONCHOSCOPY;  Surgeon: Grace Isaac, MD;  [facility]: [facility] OR;  Service: Thoracic;  Laterality: N/A;  . Video assisted thoracoscopy (vats)/wedge resection  12/04/2012    Procedure: VIDEO ASSISTED THORACOSCOPY (VATS)/WEDGE RESECTION;  Surgeon: Grace Isaac, MD;  Location: [place];  Service: Thoracic;  Laterality: Right;  . Lobectomy  12/04/2012    Procedure: LOBECTOMY;  Surgeon: Grace Isaac, MD;  Location: [place];  Service: Thoracic;  Laterality: Right;  completion of right upper lobectomy and lymph node disection, placement of on q pump  . Tubal ligation    . Tonsillectomy     Family History  Problem Relation Age of Onset  . Hypertension Father     deceased  . Diabetes Father   . Heart disease Father   . Hyperlipidemia Father   . Hypertension Mother   . Hyperlipidemia Mother   . Diabetes Mother   . Hypertension Sister   . Hyperlipidemia Sister   . Hypertension Brother   . Hyperlipidemia Brother    History  Substance Use Topics  . Smoking status: Former Smoker -- 0.25 packs/day for 10 years    Types: Cigarettes    Quit date: 07/07/2013  . Smokeless tobacco: Never Used     Comment: Smokes pk q 3 days   marajuna aug  . Alcohol Use: No  Comment: quit 34yrs ago   OB History   Grav Para Term Preterm Abortions TAB SAB Ect Mult Living                 Review of Systems  Constitutional: Negative for fever and chills.  Gastrointestinal: Positive for nausea, abdominal pain and diarrhea. Negative for vomiting, constipation, blood in stool and abdominal distention.  Genitourinary: Negative for dysuria and hematuria.      Allergies  Haldol; Acetaminophen; Ibuprofen; Metronidazole; Morphine and related; and Penicillins  Home Medications   Current Outpatient Rx  Name  Route  Sig  Dispense  Refill  . ALPRAZolam (XANAX) 1 MG tablet   Oral   Take 0.5 mg by mouth 2 (two) times daily.          Marland Kitchen alum & mag hydroxide-simeth (MAALOX/MYLANTA) 200-200-20 MG/5ML suspension    Oral   Take by mouth 3 (three) times daily as needed for indigestion or heartburn (stomach pain).         Marland Kitchen aspirin EC 81 MG tablet   Oral   Take 81 mg by mouth every other day.          . Cholecalciferol (VITAMIN D PO)   Oral   Take 1 tablet by mouth daily.         . Cyanocobalamin (VITAMIN B-12 IJ)   Injection   Inject as directed every 30 (thirty) days. Administered by Dr. York Ram between the 26th and 29th of each month         . dicyclomine (BENTYL) 20 MG tablet   Oral   Take 20 mg by mouth 3 (three) times daily with meals.         . gabapentin (NEURONTIN) 400 MG capsule   Oral   Take 400 mg by mouth 3 (three) times daily. Nerve pain         . Ipratropium-Albuterol (COMBIVENT RESPIMAT) 20-100 MCG/ACT AERS respimat   Inhalation   Inhale 2 puffs into the lungs daily as needed for wheezing or shortness of breath.          . loratadine (CLARITIN) 10 MG tablet   Oral   Take 10 mg by mouth daily.          . Menthol-Methyl Salicylate (MUSCLE RUB) 10-15 % CREA   Topical   Apply 1 application topically every other day. Left shoulder pain         . metFORMIN (GLUCOPHAGE) 500 MG tablet   Oral   Take 500 mg by mouth 2 (two) times daily with a meal.          . methocarbamol (ROBAXIN) 500 MG tablet   Oral   Take 500 mg by mouth every 8 (eight) hours as needed for muscle spasms.          . methylPREDNIsolone (MEDROL DOSPACK) 4 MG tablet   Oral   Take 4 mg by mouth as directed. Tapered course per package directions - started 02/07/14         . metoprolol tartrate (LOPRESSOR) 25 MG tablet   Oral   Take 25 mg by mouth 2 (two) times daily.          Marland Kitchen olmesartan-hydrochlorothiazide (BENICAR HCT) 40-25 MG per tablet   Oral   Take 1 tablet by mouth daily.         Marland Kitchen omeprazole (PRILOSEC) 20 MG capsule   Oral   Take 20 mg by mouth 2 (two) times daily before a meal.          .  OXcarbazepine (TRILEPTAL) 150 MG tablet   Oral   Take 150 mg by  mouth 3 (three) times daily.          . simvastatin (ZOCOR) 20 MG tablet   Oral   Take 20 mg by mouth at bedtime.           BP 148/66  Pulse 78  Temp(Src) 98.2 F (36.8 C) (Oral)  Resp 18  Ht 5\' 4"  (1.626 m)  Wt 148 lb 1 oz (67.161 kg)  BMI 25.40 kg/m2  SpO2 97% Physical Exam  Nursing note and vitals reviewed. Constitutional: She is oriented to person, place, and time. She appears well-developed and well-nourished. No distress.  HENT:  Head: Normocephalic and atraumatic.  Mouth/Throat: Uvula is midline. Mucous membranes are dry.  Eyes: EOM are normal. Pupils are equal, round, and reactive to light. No scleral icterus.  Neck: Neck supple.  Cardiovascular: Normal rate, regular rhythm and normal heart sounds.   No murmur heard. Pulmonary/Chest: Effort normal and breath sounds normal. She has no wheezes.  Abdominal: Soft. Bowel sounds are normal. There is tenderness in the left lower quadrant. There is no guarding, no CVA tenderness and no tenderness at McBurney's point.  Musculoskeletal: Normal range of motion. She exhibits no edema.  Neurological: She is alert and oriented to person, place, and time.  Skin: Skin is warm and dry. No rash noted.  Psychiatric: She has a normal mood and affect. Her behavior is normal.    ED Course  Procedures (including critical care time) Labs Review Labs Reviewed  URINALYSIS, ROUTINE W REFLEX MICROSCOPIC - Abnormal; Notable for the following:    APPearance TURBID (*)    Leukocytes, UA TRACE (*)    All other components within normal limits  CLOSTRIDIUM DIFFICILE BY PCR  CBC WITH DIFFERENTIAL  COMPREHENSIVE METABOLIC PANEL  URINE MICROSCOPIC-ADD ON   Imaging Review Ct Abdomen Pelvis W Contrast  02/07/2014   CLINICAL DATA:  Left-sided pain  EXAM: CT ABDOMEN AND PELVIS WITH CONTRAST  TECHNIQUE: Multidetector CT imaging of the abdomen and pelvis was performed using the standard protocol following bolus administration of intravenous  contrast.  CONTRAST:  13mL OMNIPAQUE IOHEXOL 300 MG/ML  SOLN  COMPARISON:  CT ABD/PELVIS W CM dated 10/01/2013  FINDINGS: Lung bases are clear.  No pericardial fluid.  The common bile duct is prominent at 6 but unchanged from prior.  No focal hepatic lesion. Gallbladder, pancreas, spleen, adrenal glands, kidneys are normal.  The stomach, small bowel, appendix, cecum are normal. Colon and rectosigmoid colon are normal.  Abdominal aorta is normal caliber. No retroperitoneal periportal lymphadenopathy.  No free fluid the pelvis. The uterus and ovaries are normal. No pelvic lymphadenopathy. Posterior lumbar fusion noted anterolisthesis of L5 on S1 is not changed from prior.  IMPRESSION: 1. No acute abdominal pelvic findings. 2. Dilatation of the common bile duct is similar to comparison exam.   Electronically Signed   By: Suzy Bouchard M.D.   On: 02/07/2014 20:08     EKG Interpretation None      MDM   Final diagnoses:  Abdominal pain, left lower quadrant  History of Clostridium difficile infection   Pt with a history of C-diff (last positive 11 months ago), presents with intermittent loose stool and LLQ discomfort.  Pain medication ordered. CT Abdomen to rule out diverticulitis with abscess. C-diff ordered. CT results show no acute findings.   Re-eval Pt reports persistent LLQ discomfort. Tender to palpation of LLQ. Without resolution of her symptoms  and is unable to have a BM in the ED for C-diff testing. Discussed pt with teaching service will be admitted under, Dr. Lynnae January.  Meds given in ED:  Medications  sodium chloride 0.9 % bolus 1,000 mL (1,000 mLs Intravenous New Bag/Given 02/07/14 1953)  fentaNYL (SUBLIMAZE) injection 50 mcg (50 mcg Intravenous Given 02/07/14 1949)  iohexol (OMNIPAQUE) 300 MG/ML solution 25 mL (25 mLs Oral Contrast Given 02/07/14 1906)  iohexol (OMNIPAQUE) 300 MG/ML solution 100 mL (100 mLs Intravenous Contrast Given 02/07/14 1936)    New Prescriptions   No  medications on file        Lorrine Kin, PA-C 02/09/14 1235

## 2014-02-07 NOTE — ED Notes (Signed)
Waiting on IV Team to start PIV before meds can be administered.

## 2014-02-08 LAB — LIPASE, BLOOD: Lipase: 84 U/L — ABNORMAL HIGH (ref 11–59)

## 2014-02-08 LAB — GLUCOSE, CAPILLARY
GLUCOSE-CAPILLARY: 93 mg/dL (ref 70–99)
Glucose-Capillary: 126 mg/dL — ABNORMAL HIGH (ref 70–99)

## 2014-02-08 LAB — LACTIC ACID, PLASMA: Lactic Acid, Venous: 1.9 mmol/L (ref 0.5–2.2)

## 2014-02-08 LAB — CLOSTRIDIUM DIFFICILE BY PCR: CDIFFPCR: NEGATIVE

## 2014-02-08 NOTE — Discharge Summary (Signed)
Name: [name] [name] MRN: [id number] DOB: [birth year] 63 y.o. PCP: Harvie Junior, MD  Date of Admission: 02/07/2014  3:54 PM Date of Discharge: 02/08/2014 Attending Physician: Bartholomew Crews, MD  Discharge Diagnosis: Principal Problem:   Abdominal pain Active Problems:   Bipolar disorder   Anxiety disorder   Bipolar 1 disorder   Hypertension   Diabetes mellitus   Intermittent diarrhea   Discharge Medications:   Medication List    STOP taking these medications       metFORMIN 500 MG tablet  Commonly known as:  GLUCOPHAGE      TAKE these medications       ALPRAZolam 1 MG tablet  Commonly known as:  XANAX  Take 0.5 mg by mouth 2 (two) times daily.     alum & mag hydroxide-simeth 200-200-20 MG/5ML suspension  Commonly known as:  MAALOX/MYLANTA  Take by mouth 3 (three) times daily as needed for indigestion or heartburn (stomach pain).     aspirin EC 81 MG tablet  Take 81 mg by mouth every other day.     COMBIVENT RESPIMAT 20-100 MCG/ACT Aers respimat  Generic drug:  Ipratropium-Albuterol  Inhale 2 puffs into the lungs daily as needed for wheezing or shortness of breath.     dicyclomine 20 MG tablet  Commonly known as:  BENTYL  Take 20 mg by mouth 3 (three) times daily with meals.     gabapentin 400 MG capsule  Commonly known as:  NEURONTIN  Take 400 mg by mouth 3 (three) times daily. Nerve pain     HYDROcodone-acetaminophen 10-325 MG per tablet  Commonly known as:  NORCO  Take 1 tablet by mouth every 8 (eight) hours as needed for moderate pain.     loratadine 10 MG tablet  Commonly known as:  CLARITIN  Take 10 mg by mouth daily.     methocarbamol 500 MG tablet  Commonly known as:  ROBAXIN  Take 500 mg by mouth every 8 (eight) hours as needed for muscle spasms.     methylPREDNIsolone 4 MG tablet  Commonly known as:  MEDROL DOSPACK  Take 4 mg by mouth as directed. Tapered course per package directions - started 02/07/14     metoprolol tartrate  25 MG tablet  Commonly known as:  LOPRESSOR  Take 25 mg by mouth 2 (two) times daily.     MUSCLE RUB 10-15 % Crea  Apply 1 application topically every other day. Left shoulder pain     olmesartan-hydrochlorothiazide 40-25 MG per tablet  Commonly known as:  BENICAR HCT  Take 1 tablet by mouth daily.     omeprazole 20 MG capsule  Commonly known as:  PRILOSEC  Take 20 mg by mouth 2 (two) times daily before a meal.     OXcarbazepine 150 MG tablet  Commonly known as:  TRILEPTAL  Take 150 mg by mouth 3 (three) times daily.     simvastatin 20 MG tablet  Commonly known as:  ZOCOR  Take 20 mg by mouth at bedtime.     VITAMIN B-12 IJ  Inject as directed every 30 (thirty) days. Administered by Dr. York Ram between the 26th and 29th of each month     VITAMIN D PO  Take 1 tablet by mouth daily.        Disposition and follow-up:   [name] [name] was discharged from [facility] in Good condition.  At the hospital follow up visit please address:  1.  GI symptoms?  We stopped Metformin due to concern that this was contributing to GI issues. She will hold it until she sees her PCP. Please assess CBG control off Metformin. We also suggested she adhere to a dairy free diet for a while and see if that improves her symptoms. Consider lactose intolerance/gluten intolerance work up.   2.  Labs / imaging needed at time of follow-up: HBV DNA and repeat titers (She was Hepatitis B core ab positive but SAg and C IgM negative in 02/2013, see problem below for discussion)  3.  Pending labs/ test needing follow-up: None  Follow-up Appointments:     Follow-up Information   Follow up with Harvie Junior, MD On 02/12/2014. (Please follow up with Dr. Jimmye Norman, your PCP, on Thursday as planned.)    Specialty:  Specialist   Contact information:   [place]. [place] [address] [phone]       Discharge Instructions: Discharge Orders   Future Appointments  Provider Department Dept Phone   02/19/2014 3:30 PM [facility] [place] [facility] AT [facility] [phone]   Liquids only 4 hours prior to your exam. Any medications can be taken as usual. Please arrive 15 min prior to your scheduled exam time.   02/19/2014 4:30 PM Grace Isaac, MD [facility] [phone]   Future Orders Complete By Expires   Call MD for:  persistant nausea and vomiting  As directed    Call MD for:  severe uncontrolled pain  As directed    Call MD for:  temperature >100.4  As directed    Diet - low sodium heart healthy  As directed    Increase activity slowly  As directed       Consultations:  None  Procedures Performed:  Ct Abdomen Pelvis W Contrast  02/07/2014   CLINICAL DATA:  Left-sided pain  EXAM: CT ABDOMEN AND PELVIS WITH CONTRAST  TECHNIQUE: Multidetector CT imaging of the abdomen and pelvis was performed using the standard protocol following bolus administration of intravenous contrast.  CONTRAST:  155mL OMNIPAQUE IOHEXOL 300 MG/ML  SOLN  COMPARISON:  CT ABD/PELVIS W CM dated 10/01/2013  FINDINGS: Lung bases are clear.  No pericardial fluid.  The common bile duct is prominent at 6 but unchanged from prior.  No focal hepatic lesion. Gallbladder, pancreas, spleen, adrenal glands, kidneys are normal.  The stomach, small bowel, appendix, cecum are normal. Colon and rectosigmoid colon are normal.  Abdominal aorta is normal caliber. No retroperitoneal periportal lymphadenopathy.  No free fluid the pelvis. The uterus and ovaries are normal. No pelvic lymphadenopathy. Posterior lumbar fusion noted anterolisthesis of L5 on S1 is not changed from prior.  IMPRESSION: 1. No acute abdominal pelvic findings. 2. Dilatation of the common bile duct is similar to comparison exam.   Electronically Signed   By: Suzy Bouchard M.D.   On: 02/07/2014 20:08    Admission HPI:  [name] [name] is a 63 year old woman with a PMH of HTN, DM  type 2 (Hgb A1c 6.18 February 2013), HBV, HCV, lung adenocarcinoma (s/p right upper lobectomy, no chemo or XRT), bipolar disorder, anxiety, IBS and hx of diverticulitis and c diff colitis. She presents with c/o of diarrhea and LLQ pain. She has a history of diarrhea related to IBS but reports increasing symptoms for the past month. She notes almost daily diarrhea but sometimes every other day. During the course of the day she may have about six BMs. Her stools are non-bloody,  non-melanous and range in color from green to [place]. Though she has frequent loose stools she also notes the occurrence of hard stools during the day as well. She often has the sensation of incomplete evacuation as well as gas and bloating. She denies rectal pain with defecation or hematochezia. She denies recent antibiotic use, new foods or sick contacts. Her only new medication is a Medrol DosePak which she just started today.   She also notes LLQ pain x 1 week. The pain is described as an intermittent "pulling" sensation that is 10/10 at worse and increases after BMs. Laying on her left side makes the pain somewhat better. She takes Lortab for back pain and says it also helps this abdominal pain. She has had decreased po since her IBS diagnosis because she says there are certain foods that aggravate her IBS and so she has a limited diet. She has subjective fevers but denies N/V.  In the ED: T 98.77F, RR 16, SpO2 97%, HR 89, BP 151/54mmHg; she received 1L NSS bolus and 31mcg Fentanyl.  Physical Exam:  Blood pressure 148/66, pulse 78, temperature 98.2 F (36.8 C), temperature source Oral, resp. rate 18, height 5\' 4"  (1.626 m), weight 148 lb 1 oz (67.161 kg), SpO2 97.00%.  General: resting in bed in NAD  HEENT: PERRL, EOMI, oropharynx clear and moist  Cardiac: RRR, no rubs, murmurs or gallops  Pulm: clear to auscultation bilaterally, moving normal volumes of air  Abd: soft, nondistended, BS present in all four quadrants; no rebound,  rigidity or guarding, + LLQ TTP, left flank tenderness  Ext: warm and well perfused, no pedal edema, 2+ DPs  Neuro: alert and oriented X3, cranial nerves II-XII grossly intact, 5/5 MMS upper and lower extremities, moving all four extremities voluntarily   Hospital Course by problem list: 62 year old woman with a PMH of HTN, DM type 2 (Hgb A1c 6.18 February 2013), HBV, HCV, lung adenocarcinoma (s/p right upper lobectomy, no chemo or XRT), bipolar disorder, anxiety, IBS and hx of diverticulitis and c diff colitis with c/o of diarrhea and LLQ pain.   LLQ pain and diarrhea: Patient complained of intermittent diarrhea and left flank tenderness. There were no notable CT findings except chronic dilation of common bile dict. She has a history of C. Diff colitis last year, but PCR testing was negative here. No recent antibiotic use or hospital admission. Ischemic colitis unlikely given no hematochezia or lactic acidosis. UA negative for infection and there was no CT evidence of nephrolithiasis. She had a slightly elevated lipase, 84, but exam did not correlate and we would expect 2-3x normal elevation in acute pancreatitis. Furthermore, there was no CT evidence of pancreatitis. Diverticulitis was considered given the location of her pain and her hx of diverticulitis, however she lacked fever, leukocytosis and again had a negative CT. The patient's alternating diarrhea and constipations, feeling of incomplete emptying and bloating were more consistent with IBS and she even said these symptoms were consistent with her IBS, just worse in the past month. She is also on Metformin, which can cause GI complaints. However, she has been on this mediation for 15 years. We continued Bentyl - antispasmodic and simethicone prn - antigas with improvement in symptoms. She tolerated a carb-modified diet. She told us she eats a lot of cheese and has not been worked up for lactose intolerance. At discharge we recommended she avoid dairy  products and hold her Metformin until hospital follow up with her PCP.  HTN: Stable.  We continued her home medications Lopressor 25mg  daily, olmesartan-HCTZ 40/25mg  daily.  DM type 2: Stable. HgbA1c 6.1 in April 2014. We held her Metformin and recommended she continue holding until her PCP follow up. She will check her CBGs at home and bring them to her appointment.  Adenocarcinoma of the lung: The patient is s/p right upper lobectomy with lymph node sampling. Path stage 1A (T1a,N0,cM0). No chemo or XRT was required.   Cervical spine dx: The patient has a Medrol dosepak with her that she says was prescribed for cervical spine dx. She just started the pack today but only took 1 pill. She says she takes Lortab 10/325 q8h daily for chronic pain. We continued her steroid taper and Robaxin prn and provided 0.5mg  IV Dilaudid q3h prn for pain control. She asked for narcotic pain medication at discharge but we did not prescribe any due to concern over constipation/GI issues.  Bipolar d/o: Appears stable. We continued oxcarbazepine.   Anxiety: Stable. We continued home medication, Xanax 0.5mg  BID.   Hepatitis B: She is Heb B Core Total Ab positive, Heb B C IgM negative, Hep B Surface Ag Negative in 02/2013. Isolated detection of anti-HBc can occur in three settings: during the window period of acute hepatitis B when the anti-HBc is predominantly IgM class; many years after recovery from acute hepatitis B when anti-HBs has fallen to undetectable levels; and after many years of chronic HBV infection when the HBsAg titer has decreased below the cutoff level for detection. Please follow up as outpatient with HBV DNA and repeat titers. Hr LFTs were wnl here, see below.   Hepatic Function Panel     Component Value Date/Time   PROT 7.4 02/07/2014 1704   ALBUMIN 4.1 02/07/2014 1704   AST 21 02/07/2014 1704   ALT 14 02/07/2014 1704   ALKPHOS 95 02/07/2014 1704   BILITOT 0.5 02/07/2014 1704   BILIDIR 0.1 03/08/2013  0720   IBILI 0.6 03/08/2013 0720     Discharge Vitals:   BP 161/83  Pulse 65  Temp(Src) 97.8 F (36.6 C) (Oral)  Resp 16  Ht 5\' 4"  (1.626 m)  Wt 166 lb 1.6 oz (75.342 kg)  BMI 28.50 kg/m2  SpO2 98%  Discharge Labs:  Results for orders placed during the hospital encounter of 02/07/14 (from the past 24 hour(s))  URINALYSIS, ROUTINE W REFLEX MICROSCOPIC     Status: Abnormal   Collection Time    02/07/14 12:59 PM      Result Value Ref Range   Color, Urine YELLOW  YELLOW   APPearance TURBID (*) CLEAR   Specific Gravity, Urine 1.030  1.005 - 1.030   pH 7.5  5.0 - 8.0   Glucose, UA NEGATIVE  NEGATIVE mg/dL   Hgb urine dipstick NEGATIVE  NEGATIVE   Bilirubin Urine NEGATIVE  NEGATIVE   Ketones, ur NEGATIVE  NEGATIVE mg/dL   Protein, ur NEGATIVE  NEGATIVE mg/dL   Urobilinogen, UA 1.0  0.0 - 1.0 mg/dL   Nitrite NEGATIVE  NEGATIVE   Leukocytes, UA TRACE (*) NEGATIVE  URINE MICROSCOPIC-ADD ON     Status: None   Collection Time    02/07/14 12:59 PM      Result Value Ref Range   WBC, UA 0-2  <3 WBC/hpf   Urine-Other AMORPHOUS URATES/PHOSPHATES    CBC WITH DIFFERENTIAL     Status: None   Collection Time    02/07/14  5:04 PM      Result Value Ref Range  WBC 4.9  4.0 - 10.5 K/uL   RBC 4.62  3.87 - 5.11 MIL/uL   Hemoglobin 13.4  12.0 - 15.0 g/dL   HCT 40.3  36.0 - 46.0 %   MCV 87.2  78.0 - 100.0 fL   MCH 29.0  26.0 - 34.0 pg   MCHC 33.3  30.0 - 36.0 g/dL   RDW 13.8  11.5 - 15.5 %   Platelets 251  150 - 400 K/uL   Neutrophils Relative % 47  43 - 77 %   Neutro Abs 2.3  1.7 - 7.7 K/uL   Lymphocytes Relative 46  12 - 46 %   Lymphs Abs 2.3  0.7 - 4.0 K/uL   Monocytes Relative 5  3 - 12 %   Monocytes Absolute 0.3  0.1 - 1.0 K/uL   Eosinophils Relative 0  0 - 5 %   Eosinophils Absolute 0.0  0.0 - 0.7 K/uL   Basophils Relative 1  0 - 1 %   Basophils Absolute 0.0  0.0 - 0.1 K/uL  COMPREHENSIVE METABOLIC PANEL     Status: None   Collection Time    02/07/14  5:04 PM      Result  Value Ref Range   Sodium 142  137 - 147 mEq/L   Potassium 4.0  3.7 - 5.3 mEq/L   Chloride 103  96 - 112 mEq/L   CO2 26  19 - 32 mEq/L   Glucose, Bld 92  70 - 99 mg/dL   BUN 22  6 - 23 mg/dL   Creatinine, Ser 0.50  0.50 - 1.10 mg/dL   Calcium 9.7  8.4 - 10.5 mg/dL   Total Protein 7.4  6.0 - 8.3 g/dL   Albumin 4.1  3.5 - 5.2 g/dL   AST 21  0 - 37 U/L   ALT 14  0 - 35 U/L   Alkaline Phosphatase 95  39 - 117 U/L   Total Bilirubin 0.5  0.3 - 1.2 mg/dL   GFR calc non Af Amer >90  >90 mL/min   GFR calc Af Amer >90  >90 mL/min  CLOSTRIDIUM DIFFICILE BY PCR     Status: None   Collection Time    02/07/14  9:27 PM      Result Value Ref Range   C difficile by pcr NEGATIVE  NEGATIVE  GLUCOSE, CAPILLARY     Status: Abnormal   Collection Time    02/07/14 10:36 PM      Result Value Ref Range   Glucose-Capillary 102 (*) 70 - 99 mg/dL   Comment 1 Notify RN    LACTIC ACID, PLASMA     Status: None   Collection Time    02/07/14 11:45 PM      Result Value Ref Range   Lactic Acid, Venous 1.9  0.5 - 2.2 mmol/L  LIPASE, BLOOD     Status: Abnormal   Collection Time    02/07/14 11:49 PM      Result Value Ref Range   Lipase 84 (*) 11 - 59 U/L  GLUCOSE, CAPILLARY     Status: None   Collection Time    02/08/14  7:36 AM      Result Value Ref Range   Glucose-Capillary 93  70 - 99 mg/dL    Signed: Lesly Dukes, MD 02/08/2014, 11:15 AM   Time Spent on Discharge: 35 minutes Services Ordered on Discharge: None Equipment Ordered on Discharge: None

## 2014-02-08 NOTE — Progress Notes (Signed)
Discharge instructions reviewed with pt and pt's daughter.  Pt verbalized understanding and had no questions.  Pt discharged in stable condition with daughter.  Eliezer Bottom [place]

## 2014-02-08 NOTE — H&P (Signed)
  Date: 02/08/2014  Patient name: [name] [name]  Medical record number: [id number]  Date of birth: Mar 28, 1951   I have seen and evaluated Thomasena Edis and discussed their care with the Residency Team. Ms Milhoan has long h/o ABD pain with several ER admits. She returns with the same - LLQ pain along with D that is different / worse than her usual IBS. Her BM's are freq, urgent, but alternate btwn loose and formed. Her Lortabs help with the pain. She has had GI MD - Dr Oletta Lamas - but states she was not allowed to make appt with him when she last called. Colon 2014 - tubular adenoma.   Dr Jimmye Norman Rx's her hydrocodone. Was getting #90 on 11/6, 12/6, 1/3, and 1/29. On 2/26 got #75.   Her C diff is negative, her CT does not explain her pain. Her labs are nl. Her exam is benign.   Assessment and Plan: I have seen and evaluated the patient as outlined above. I agree with the formulated Assessment and Plan as detailed in the residents' admission note, with the following changes:   1. ABD pain & D - no reversible etiology nor serious etiology found. She will need F/U with PCP and GI as inpt team doesn't know if Celiac dz, SIBO, Microscopic colitis has been W/U and R/O. We will stop her metformin as can be etiology of pain and D even after long term usage. Avoid lactose.   2. Type 2 DM - stop metformin. She checks CBG BID so will know if increasing and has F/U PCP 3/26.   3. Chronic Hep B - she is + for surface Ag and total Hep B core total Ab but is core IgM negative so means is + for IgG core. Her plts are nl, her LFT's nl, and Ct liver nl.  4. Chronic pain - per her PCP.  Bartholomew Crews, MD 3/22/201511:08 AM

## 2014-02-08 NOTE — Discharge Instructions (Addendum)
It was a pleasure taking care of you. - You do not have C. Difficile infection. Your CT scan was normal. And your lab work was normal. - Your symptoms are likely due to irritable bowel syndrome. - We suggest you stop taking the Metformin until your appointment with Dr. Jimmye Norman. This medicine can cause GI problems. - Please keep checking your sugars at home and bring the results to your doctor's appointment. - You also may have lactose intolerance. This is a condition where you develop diarrhea and gas after eating milk products. - Please avoid all cheese, milk, ice cream, cream, butter, for the next week and see if it helps your symptoms. - We cannot give you any additional narcotic pain medication because it can cause constipation. Please talk to your PCP about pain control on Thursday. - If you develop persistent diarrhea, vomiting, abdominal pain, fever please call your clinic or return to the ED.

## 2014-02-09 NOTE — ED Provider Notes (Signed)
Medical screening examination/treatment/procedure(s) were performed by non-physician practitioner and as supervising physician I was immediately available for consultation/collaboration.   EKG Interpretation None        Malvin Johns, MD 02/09/14 1907

## 2014-02-19 ENCOUNTER — Ambulatory Visit: Payer: Medicare Other | Admitting: Cardiothoracic Surgery

## 2014-02-19 ENCOUNTER — Inpatient Hospital Stay: Admission: RE | Admit: 2014-02-19 | Payer: Self-pay | Source: Ambulatory Visit

## 2014-02-19 ENCOUNTER — Encounter (HOSPITAL_COMMUNITY): Payer: Self-pay | Admitting: Pharmacy Technician

## 2014-02-19 ENCOUNTER — Other Ambulatory Visit: Payer: Self-pay | Admitting: Orthopedic Surgery

## 2014-02-25 NOTE — Pre-Procedure Instructions (Signed)
[name] [name]  02/25/2014   Your procedure is scheduled on:  Thursday April 16 th at 0730 AM  Report to [place] Entrance "A" at 0530 AM.  Call this number if you have problems the morning of surgery: [phone]   Remember:   Do not eat food or drink liquids after midnight Wednesday.   Take these medicines the morning of surgery with A SIP OF WATER: Alprazolam(Xanax), Gabapentin(Neurontin), Hydrocodone-acetaminophen if needed for pain,  Loratadine(Claritin), Metoprolol(Lopressor), Omeprazole(Prilosec) , Oxcarbazepine(Trileptal) and Dicyclomine(Bentyl) Use and bring Combivent inhaler day of surgery.   Stop Aspirin, Nsaids, Vitamins, and Herbal medication 5 days prior to surgery.   Do not wear jewelry, make-up or nail polish.  Do not wear lotions, powders, or perfumes. You may wear deodorant.  Do not shave 48 hours prior to surgery.   Do not bring valuables to the hospital.  [facility] is not responsible  for any belongings or valuables.               Contacts, dentures or bridgework may not be worn into surgery.  Leave suitcase in the car. After surgery it may be brought to your room.  For patients admitted to the hospital, discharge time is determined by your  treatment team.               Patients discharged the day of surgery will not be allowed to drive home.    Special Instructions: [place] - Preparing for Surgery  Before surgery, you can play an important role.  Because skin is not sterile, your skin needs to be as free of germs as possible.  You can reduce the number of germs on you skin by washing with CHG (chlorahexidine gluconate) soap before surgery.  CHG is an antiseptic cleaner which kills germs and bonds with the skin to continue killing germs even after washing.  Please DO NOT use if you have an allergy to CHG or antibacterial soaps.  If your skin becomes reddened/irritated stop using the CHG and inform your nurse when you arrive at Short Stay.  Do not  shave (including legs and underarms) for at least 48 hours prior to the first CHG shower.  You may shave your face.  Please follow these instructions carefully:   1.  Shower with CHG Soap the night before surgery and the                                morning of Surgery.  2.  If you choose to wash your hair, wash your hair first as usual with your       normal shampoo.  3.  After you shampoo, rinse your hair and body thoroughly to remove the                      Shampoo.  4.  Use CHG as you would any other liquid soap.  You can apply chg directly       to the skin and wash gently with scrungie or a clean washcloth.  5.  Apply the CHG Soap to your body ONLY FROM THE NECK DOWN.        Do not use on open wounds or open sores.  Avoid contact with your eyes,       ears, mouth and genitals (private parts).  Wash genitals (private parts)       with your normal soap.  6.  Wash thoroughly, paying special attention to the area where your surgery        will be performed.  7.  Thoroughly rinse your body with warm water from the neck down.  8.  DO NOT shower/wash with your normal soap after using and rinsing off       the CHG Soap.  9.  Pat yourself dry with a clean towel.            10.  Wear clean pajamas.            11.  Place clean sheets on your bed the night of your first shower and do not        sleep with pets.  Day of Surgery  Do not apply any lotions/deoderants the morning of surgery.  Please wear clean clothes to the hospital/surgery center.      Please read over the following fact sheets that you were given: Pain Booklet, Coughing and Deep Breathing, Blood Transfusion Information, MRSA Information and Surgical Site Infection Prevention

## 2014-02-26 ENCOUNTER — Encounter (HOSPITAL_COMMUNITY)
Admission: RE | Admit: 2014-02-26 | Discharge: 2014-02-26 | Disposition: A | Discharge status: Home / Self Care | Payer: Medicare Other | Source: Ambulatory Visit | Attending: Orthopedic Surgery | Admitting: Orthopedic Surgery

## 2014-02-26 ENCOUNTER — Encounter (HOSPITAL_COMMUNITY): Payer: Self-pay

## 2014-02-26 DIAGNOSIS — Z01812 Encounter for preprocedural laboratory examination: Secondary | ICD-10-CM | POA: Insufficient documentation

## 2014-02-26 HISTORY — DX: Other specified postprocedural states: Z98.890

## 2014-02-26 HISTORY — DX: Unspecified convulsions: R56.9

## 2014-02-26 HISTORY — DX: Other specified postprocedural states: R11.2

## 2014-02-26 LAB — COMPREHENSIVE METABOLIC PANEL
ALBUMIN: 3.8 g/dL (ref 3.5–5.2)
ALK PHOS: 87 U/L (ref 39–117)
ALT: 16 U/L (ref 0–35)
AST: 24 U/L (ref 0–37)
BUN: 12 mg/dL (ref 6–23)
CHLORIDE: 101 meq/L (ref 96–112)
CO2: 24 mEq/L (ref 19–32)
Calcium: 9.3 mg/dL (ref 8.4–10.5)
Creatinine, Ser: 0.59 mg/dL (ref 0.50–1.10)
GFR calc Af Amer: >90 mL/min (ref >90)
Glucose, Bld: 99 mg/dL (ref 70–99)
POTASSIUM: 3.8 meq/L (ref 3.7–5.3)
Sodium: 140 mEq/L (ref 137–147)
Total Bilirubin: 0.2 mg/dL — ABNORMAL LOW (ref 0.3–1.2)
Total Protein: 6.9 g/dL (ref 6.0–8.3)

## 2014-02-26 LAB — URINALYSIS, ROUTINE W REFLEX MICROSCOPIC
Bilirubin Urine: NEGATIVE
GLUCOSE, UA: NEGATIVE mg/dL
Hgb urine dipstick: NEGATIVE
Ketones, ur: NEGATIVE mg/dL
LEUKOCYTES UA: NEGATIVE
Nitrite: NEGATIVE
PH: 8 (ref 5.0–8.0)
Protein, ur: NEGATIVE mg/dL
SPECIFIC GRAVITY, URINE: 1.019 (ref 1.005–1.030)
Urobilinogen, UA: 0.2 mg/dL (ref 0.0–1.0)

## 2014-02-26 LAB — PROTIME-INR
INR: 0.98 (ref 0.00–1.49)
PROTHROMBIN TIME: 12.8 s (ref 11.6–15.2)

## 2014-02-26 LAB — CBC WITH DIFFERENTIAL/PLATELET
BASOS PCT: 1 % (ref 0–1)
Basophils Absolute: 0 10*3/uL (ref 0.0–0.1)
Eosinophils Absolute: 0.2 10*3/uL (ref 0.0–0.7)
Eosinophils Relative: 2 % (ref 0–5)
HEMATOCRIT: 39.5 % (ref 36.0–46.0)
HEMOGLOBIN: 12.8 g/dL (ref 12.0–15.0)
LYMPHS ABS: 2.1 10*3/uL (ref 0.7–4.0)
Lymphocytes Relative: 28 % (ref 12–46)
MCH: 28.9 pg (ref 26.0–34.0)
MCHC: 32.4 g/dL (ref 30.0–36.0)
MCV: 89.2 fL (ref 78.0–100.0)
MONO ABS: 0.5 10*3/uL (ref 0.1–1.0)
Monocytes Relative: 6 % (ref 3–12)
NEUTROS ABS: 4.7 10*3/uL (ref 1.7–7.7)
NEUTROS PCT: 63 % (ref 43–77)
Platelets: 222 10*3/uL (ref 150–400)
RBC: 4.43 MIL/uL (ref 3.87–5.11)
RDW: 13.9 % (ref 11.5–15.5)
WBC: 7.5 10*3/uL (ref 4.0–10.5)

## 2014-02-26 LAB — APTT: aPTT: 29 seconds (ref 24–37)

## 2014-02-26 LAB — TYPE AND SCREEN
ABO/RH(D): O POS
Antibody Screen: NEGATIVE

## 2014-02-26 LAB — SURGICAL PCR SCREEN
MRSA, PCR: NEGATIVE
STAPHYLOCOCCUS AUREUS: NEGATIVE

## 2014-03-04 MED ORDER — VANCOMYCIN HCL IN DEXTROSE 1-5 GM/200ML-% IV SOLN
1000.0000 mg | INTRAVENOUS | Status: AC
  Administered 2014-03-05: 1000 mg via INTRAVENOUS
  Filled 2014-03-04: qty 200

## 2014-03-04 MED ORDER — POVIDONE-IODINE 7.5 % EX SOLN
Freq: Once | CUTANEOUS | Status: DC
Start: 2014-03-04 — End: 2014-03-05
  Filled 2014-03-04: qty 118

## 2014-03-04 NOTE — H&P (Signed)
PREOPERATIVE H&P  Chief Complaint: left arm pain  HPI: [name] [name] is a 63 y.o. female who presents with ongoing left arm pain. MRI = stenosis at C45 and C56. Patient has failed conservative care and elected to proceed with an ACDF 4-6.  Past Medical History  Diagnosis Date  . Hypertension   . Diabetes mellitus   . Bipolar 1 disorder   . Heart murmur   . Sciatica   . Arthritis   . Anxiety   . Diverticulosis   . Chronic low back pain   . DDD (degenerative disc disease), lumbar   . Chronic shoulder pain   . Chronic neck pain   . Hepatitis B     Unclear when initially diagnosed, labs in Epic from 03/09/13  . Hepatitis C     Unclear when initially diagnosed, labs in Epic from 03/09/13  . C. difficile diarrhea     April and February 2014  . Adenocarcinoma of lung, stage 1   . COPD (chronic obstructive pulmonary disease)   . GERD (gastroesophageal reflux disease)   . H/O hiatal hernia   . Headache(784.0)     hx  . PONV (postoperative nausea and vomiting)   . Seizures     on meds   Past Surgical History  Procedure Laterality Date  . Facial tumor removal Right 2000  . Brain tumor removal  09  . Brain surgery      in [place]  . Video bronchoscopy  12/04/2012    Procedure: VIDEO BRONCHOSCOPY;  Surgeon: Grace Isaac, MD;  Location: [facility] OR;  Service: Thoracic;  Laterality: N/A;  . Video assisted thoracoscopy (vats)/wedge resection  12/04/2012    Procedure: VIDEO ASSISTED THORACOSCOPY (VATS)/WEDGE RESECTION;  Surgeon: Grace Isaac, MD;  Location: [place];  Service: Thoracic;  Laterality: Right;  . Lobectomy  12/04/2012    Procedure: LOBECTOMY;  Surgeon: Grace Isaac, MD;  Location: [place];  Service: Thoracic;  Laterality: Right;  completion of right upper lobectomy and lymph node disection, placement of on q pump  . Tubal ligation    . Tonsillectomy     History   Social History  . Marital Status: Widowed    Spouse Name: N/A    Number of Children: N/A  .  Years of Education: N/A   Occupational History  . n/a     patient draws SNN/SSI   Social History Main Topics  . Smoking status: Former Smoker -- 0.25 packs/day for 10 years    Types: Cigarettes    Quit date: 07/07/2013  . Smokeless tobacco: Never Used     Comment: Smokes pk q 3 days   marajuna aug  . Alcohol Use: No     Comment: quit 19yrs ago  . Drug Use: Yes    Special: Hydrocodone, Marijuana     Comment: "maybe about once a month"  . Sexual Activity: No   Other Topics Concern  . Not on file   Social History Narrative  . No narrative on file   Family History  Problem Relation Age of Onset  . Hypertension Father     deceased  . Diabetes Father   . Heart disease Father   . Hyperlipidemia Father   . Hypertension Mother   . Hyperlipidemia Mother   . Diabetes Mother   . Hypertension Sister   . Hyperlipidemia Sister   . Hypertension Brother   . Hyperlipidemia Brother    Allergies  Allergen Reactions  . Haldol [Haloperidol Decanoate]  Other (See Comments)    tongue swelling  . Acetaminophen Other (See Comments)    Stomach pain  . Ibuprofen Other (See Comments)    Stomach pain  . Metronidazole Other (See Comments)    Severe stomach upset--was instructed to avoid Flagyl.  . Morphine And Related Hives    hives  . Penicillins Rash   Prior to Admission medications   Medication Sig Start Date End Date Taking? Authorizing Provider  ALPRAZolam Duanne Moron) 1 MG tablet Take 0.5 mg by mouth 2 (two) times daily.    Yes Historical Provider, MD  alum & mag hydroxide-simeth (MAALOX/MYLANTA) 200-200-20 MG/5ML suspension Take by mouth 3 (three) times daily as needed for indigestion or heartburn (stomach pain).   Yes Historical Provider, MD  aspirin EC 81 MG tablet Take 81 mg by mouth every other day.    Yes Historical Provider, MD  Cholecalciferol (VITAMIN D PO) Take 1 tablet by mouth once a week. monday   Yes Historical Provider, MD  Cyanocobalamin (VITAMIN B-12 IJ) Inject as  directed every 30 (thirty) days. Administered by Dr. York Ram between the 26th and 29th of each month   Yes Historical Provider, MD  dicyclomine (BENTYL) 20 MG tablet Take 20 mg by mouth 3 (three) times daily with meals.   Yes Historical Provider, MD  gabapentin (NEURONTIN) 400 MG capsule Take 400 mg by mouth 3 (three) times daily. Nerve pain   Yes Historical Provider, MD  HYDROcodone-acetaminophen (NORCO) 10-325 MG per tablet Take 1 tablet by mouth every 8 (eight) hours as needed for moderate pain.   Yes Historical Provider, MD  Ipratropium-Albuterol (COMBIVENT RESPIMAT) 20-100 MCG/ACT AERS respimat Inhale 2 puffs into the lungs daily as needed for wheezing or shortness of breath.    Yes Historical Provider, MD  loratadine (CLARITIN) 10 MG tablet Take 10 mg by mouth daily.    Yes Historical Provider, MD  Menthol-Methyl Salicylate (MUSCLE RUB) 10-15 % CREA Apply 1 application topically every other day. Left shoulder pain   Yes Historical Provider, MD  metFORMIN (GLUCOPHAGE) 500 MG tablet Take 500 mg by mouth 2 (two) times daily with a meal.   Yes Historical Provider, MD  methocarbamol (ROBAXIN) 500 MG tablet Take 500 mg by mouth every 8 (eight) hours as needed for muscle spasms.  01/16/14  Yes Historical Provider, MD  metoprolol tartrate (LOPRESSOR) 25 MG tablet Take 25 mg by mouth 2 (two) times daily.  11/24/13  Yes Historical Provider, MD  olmesartan-hydrochlorothiazide (BENICAR HCT) 40-25 MG per tablet Take 1 tablet by mouth daily.   Yes Historical Provider, MD  omeprazole (PRILOSEC) 20 MG capsule Take 20 mg by mouth 2 (two) times daily before a meal.  01/16/14  Yes Historical Provider, MD  OXcarbazepine (TRILEPTAL) 150 MG tablet Take 150 mg by mouth 3 (three) times daily.  11/24/13  Yes Historical Provider, MD  simvastatin (ZOCOR) 20 MG tablet Take 20 mg by mouth at bedtime.    Yes Historical Provider, MD     All other systems have been reviewed and were otherwise negative with the exception of  those mentioned in the HPI and as above.  Physical Exam: There were no vitals filed for this visit.  General: Alert, no acute distress Cardiovascular: No pedal edema Respiratory: No cyanosis, no use of accessory musculature GI: No organomegaly, abdomen is soft and non-tender Skin: No lesions in the area of chief complaint Neurologic: Sensation intact distally Psychiatric: [name] competent for consent with normal mood and affect Lymphatic: No axillary  or cervical lymphadenopathy  MUSCULOSKELETAL: + spurling on left  Assessment/Plan: Left arm pain Plan for Procedure(s): ANTERIOR CERVICAL DECOMPRESSION/DISCECTOMY FUSION 2 LEVELS   Sinclair Ship, MD 03/04/2014 4:13 PM

## 2014-03-05 ENCOUNTER — Inpatient Hospital Stay (HOSPITAL_COMMUNITY): Payer: Medicare HMO | Admitting: Certified Registered Nurse Anesthetist

## 2014-03-05 ENCOUNTER — Encounter (HOSPITAL_COMMUNITY): Payer: Self-pay | Admitting: *Deleted

## 2014-03-05 ENCOUNTER — Inpatient Hospital Stay (HOSPITAL_COMMUNITY): Payer: Medicare HMO

## 2014-03-05 ENCOUNTER — Inpatient Hospital Stay (HOSPITAL_COMMUNITY)
Admission: RE | Admit: 2014-03-05 | Discharge: 2014-03-06 | DRG: 473 | Disposition: A | Discharge status: Home / Self Care | Payer: Medicare HMO | Source: Ambulatory Visit | Attending: Orthopedic Surgery | Admitting: Orthopedic Surgery

## 2014-03-05 ENCOUNTER — Encounter (HOSPITAL_COMMUNITY)
Admission: RE | Disposition: A | Discharge status: Home / Self Care | Payer: Self-pay | Source: Ambulatory Visit | Attending: Orthopedic Surgery

## 2014-03-05 ENCOUNTER — Encounter (HOSPITAL_COMMUNITY): Payer: Medicare HMO | Admitting: Certified Registered Nurse Anesthetist

## 2014-03-05 DIAGNOSIS — F17200 Nicotine dependence, unspecified, uncomplicated: Secondary | ICD-10-CM | POA: Diagnosis present

## 2014-03-05 DIAGNOSIS — M5410 Radiculopathy, site unspecified: Secondary | ICD-10-CM | POA: Diagnosis present

## 2014-03-05 DIAGNOSIS — B1920 Unspecified viral hepatitis C without hepatic coma: Secondary | ICD-10-CM | POA: Diagnosis present

## 2014-03-05 DIAGNOSIS — M545 Low back pain, unspecified: Secondary | ICD-10-CM | POA: Diagnosis present

## 2014-03-05 DIAGNOSIS — M4802 Spinal stenosis, cervical region: Principal | ICD-10-CM | POA: Diagnosis present

## 2014-03-05 DIAGNOSIS — J449 Chronic obstructive pulmonary disease, unspecified: Secondary | ICD-10-CM | POA: Diagnosis present

## 2014-03-05 DIAGNOSIS — F1210 Cannabis abuse, uncomplicated: Secondary | ICD-10-CM | POA: Diagnosis present

## 2014-03-05 DIAGNOSIS — M79609 Pain in unspecified limb: Secondary | ICD-10-CM | POA: Diagnosis present

## 2014-03-05 DIAGNOSIS — I10 Essential (primary) hypertension: Secondary | ICD-10-CM | POA: Diagnosis present

## 2014-03-05 DIAGNOSIS — J4489 Other specified chronic obstructive pulmonary disease: Secondary | ICD-10-CM | POA: Diagnosis present

## 2014-03-05 DIAGNOSIS — E119 Type 2 diabetes mellitus without complications: Secondary | ICD-10-CM | POA: Diagnosis present

## 2014-03-05 DIAGNOSIS — Z7982 Long term (current) use of aspirin: Secondary | ICD-10-CM

## 2014-03-05 DIAGNOSIS — G8929 Other chronic pain: Secondary | ICD-10-CM | POA: Diagnosis present

## 2014-03-05 DIAGNOSIS — K219 Gastro-esophageal reflux disease without esophagitis: Secondary | ICD-10-CM | POA: Diagnosis present

## 2014-03-05 DIAGNOSIS — Z79899 Other long term (current) drug therapy: Secondary | ICD-10-CM | POA: Diagnosis not present

## 2014-03-05 HISTORY — PX: ANTERIOR CERVICAL DECOMP/DISCECTOMY FUSION: SHX1161

## 2014-03-05 LAB — GLUCOSE, CAPILLARY
GLUCOSE-CAPILLARY: 140 mg/dL — AB (ref 70–99)
GLUCOSE-CAPILLARY: 154 mg/dL — AB (ref 70–99)
GLUCOSE-CAPILLARY: 110 mg/dL — AB (ref 70–99)
Glucose-Capillary: 98 mg/dL (ref 70–99)
Glucose-Capillary: 106 mg/dL — ABNORMAL HIGH (ref 70–99)

## 2014-03-05 SURGERY — ANTERIOR CERVICAL DECOMPRESSION/DISCECTOMY FUSION 2 LEVELS
Anesthesia: General | Site: Spine Cervical

## 2014-03-05 MED ORDER — OXYCODONE HCL 5 MG PO TABS
ORAL_TABLET | ORAL | Status: AC
  Filled 2014-03-05: qty 1

## 2014-03-05 MED ORDER — SIMVASTATIN 20 MG PO TABS
20.0000 mg | ORAL_TABLET | Freq: Every day | ORAL | Status: DC
  Administered 2014-03-05: 20 mg via ORAL
  Filled 2014-03-05 (×2): qty 1

## 2014-03-05 MED ORDER — PROPOFOL 10 MG/ML IV BOLUS
INTRAVENOUS | Status: AC
  Filled 2014-03-05: qty 20

## 2014-03-05 MED ORDER — LIDOCAINE HCL (CARDIAC) 20 MG/ML IV SOLN
INTRAVENOUS | Status: DC | PRN
  Administered 2014-03-05: 60 mg via INTRAVENOUS

## 2014-03-05 MED ORDER — EPHEDRINE SULFATE 50 MG/ML IJ SOLN
INTRAMUSCULAR | Status: AC
  Filled 2014-03-05: qty 1

## 2014-03-05 MED ORDER — DIPHENHYDRAMINE HCL 50 MG/ML IJ SOLN: 12.5000 mg | Freq: Four times a day (QID) | INTRAMUSCULAR | Status: DC | PRN

## 2014-03-05 MED ORDER — METOPROLOL TARTRATE 12.5 MG HALF TABLET
ORAL_TABLET | ORAL | Status: AC
  Filled 2014-03-05: qty 2

## 2014-03-05 MED ORDER — ONDANSETRON HCL 4 MG/2ML IJ SOLN: 4.0000 mg | INTRAMUSCULAR | Status: DC | PRN

## 2014-03-05 MED ORDER — BUPIVACAINE-EPINEPHRINE 0.25% -1:200000 IJ SOLN
INTRAMUSCULAR | Status: DC | PRN
  Administered 2014-03-05: 1 mL

## 2014-03-05 MED ORDER — PHENOL 1.4 % MT LIQD: 1.0000 | OROMUCOSAL | Status: DC | PRN

## 2014-03-05 MED ORDER — FENTANYL CITRATE 0.05 MG/ML IJ SOLN
INTRAMUSCULAR | Status: DC | PRN
  Administered 2014-03-05 (×7): 50 ug via INTRAVENOUS

## 2014-03-05 MED ORDER — FLEET ENEMA 7-19 GM/118ML RE ENEM
1.0000 | ENEMA | Freq: Once | RECTAL | Status: AC | PRN
  Filled 2014-03-05: qty 1

## 2014-03-05 MED ORDER — OXYCODONE HCL 5 MG PO TABS
5.0000 mg | ORAL_TABLET | Freq: Once | ORAL | Status: AC
  Administered 2014-03-05: 5 mg via ORAL

## 2014-03-05 MED ORDER — PROPOFOL 10 MG/ML IV BOLUS
INTRAVENOUS | Status: DC | PRN
  Administered 2014-03-05: 160 mg via INTRAVENOUS

## 2014-03-05 MED ORDER — OXYCODONE-ACETAMINOPHEN 5-325 MG PO TABS
1.0000 | ORAL_TABLET | ORAL | Status: DC | PRN
  Administered 2014-03-05: 2 via ORAL
  Administered 2014-03-05 (×2): 1 via ORAL
  Administered 2014-03-06 (×2): 2 via ORAL
  Filled 2014-03-05 (×2): qty 2
  Filled 2014-03-05: qty 1
  Filled 2014-03-05 (×2): qty 2

## 2014-03-05 MED ORDER — GLYCOPYRROLATE 0.2 MG/ML IJ SOLN
INTRAMUSCULAR | Status: DC | PRN
  Administered 2014-03-05: 0.4 mg via INTRAVENOUS

## 2014-03-05 MED ORDER — SODIUM CHLORIDE 0.9 % IJ SOLN: 3.0000 mL | Freq: Two times a day (BID) | INTRAMUSCULAR | Status: DC

## 2014-03-05 MED ORDER — SODIUM CHLORIDE 0.9 % IV SOLN: 250.0000 mL | INTRAVENOUS | Status: DC

## 2014-03-05 MED ORDER — IRBESARTAN 300 MG PO TABS
300.0000 mg | ORAL_TABLET | Freq: Every day | ORAL | Status: DC
  Administered 2014-03-05 – 2014-03-06 (×2): 300 mg via ORAL
  Filled 2014-03-05 (×2): qty 1

## 2014-03-05 MED ORDER — ONDANSETRON HCL 4 MG/2ML IJ SOLN
INTRAMUSCULAR | Status: DC | PRN
  Administered 2014-03-05: 4 mg via INTRAVENOUS

## 2014-03-05 MED ORDER — MENTHOL 3 MG MT LOZG: 1.0000 | LOZENGE | OROMUCOSAL | Status: DC | PRN

## 2014-03-05 MED ORDER — PHENYLEPHRINE HCL 10 MG/ML IJ SOLN
INTRAMUSCULAR | Status: DC | PRN
  Administered 2014-03-05 (×2): 40 ug via INTRAVENOUS
  Administered 2014-03-05 (×4): 80 ug via INTRAVENOUS

## 2014-03-05 MED ORDER — ROCURONIUM BROMIDE 100 MG/10ML IV SOLN
INTRAVENOUS | Status: DC | PRN
  Administered 2014-03-05: 50 mg via INTRAVENOUS

## 2014-03-05 MED ORDER — HYDROMORPHONE HCL PF 1 MG/ML IJ SOLN
0.5000 mg | Freq: Once | INTRAMUSCULAR | Status: AC
  Administered 2014-03-05: 0.5 mg via INTRAVENOUS

## 2014-03-05 MED ORDER — FENTANYL CITRATE 0.05 MG/ML IJ SOLN
INTRAMUSCULAR | Status: AC
  Filled 2014-03-05: qty 5

## 2014-03-05 MED ORDER — LIDOCAINE HCL (CARDIAC) 20 MG/ML IV SOLN
INTRAVENOUS | Status: AC
  Filled 2014-03-05: qty 5

## 2014-03-05 MED ORDER — DICYCLOMINE HCL 20 MG PO TABS
20.0000 mg | ORAL_TABLET | Freq: Three times a day (TID) | ORAL | Status: DC
  Administered 2014-03-05 – 2014-03-06 (×2): 20 mg via ORAL
  Filled 2014-03-05 (×5): qty 1

## 2014-03-05 MED ORDER — HYDROCHLOROTHIAZIDE 25 MG PO TABS
25.0000 mg | ORAL_TABLET | Freq: Every day | ORAL | Status: DC
  Administered 2014-03-05 – 2014-03-06 (×2): 25 mg via ORAL
  Filled 2014-03-05 (×2): qty 1

## 2014-03-05 MED ORDER — STERILE WATER FOR INJECTION IJ SOLN
INTRAMUSCULAR | Status: AC
  Filled 2014-03-05: qty 10

## 2014-03-05 MED ORDER — DOCUSATE SODIUM 100 MG PO CAPS
100.0000 mg | ORAL_CAPSULE | Freq: Two times a day (BID) | ORAL | Status: DC
  Administered 2014-03-05 – 2014-03-06 (×2): 100 mg via ORAL
  Filled 2014-03-05 (×3): qty 1

## 2014-03-05 MED ORDER — ALUM & MAG HYDROXIDE-SIMETH 200-200-20 MG/5ML PO SUSP: 30.0000 mL | Freq: Four times a day (QID) | ORAL | Status: DC | PRN

## 2014-03-05 MED ORDER — THROMBIN 20000 UNITS EX SOLR
CUTANEOUS | Status: DC | PRN
  Administered 2014-03-05: 10:00:00 via TOPICAL

## 2014-03-05 MED ORDER — BUPIVACAINE-EPINEPHRINE (PF) 0.25% -1:200000 IJ SOLN
INTRAMUSCULAR | Status: AC
  Filled 2014-03-05: qty 30

## 2014-03-05 MED ORDER — OLMESARTAN MEDOXOMIL-HCTZ 40-25 MG PO TABS: 1.0000 | ORAL_TABLET | Freq: Every day | ORAL | Status: DC

## 2014-03-05 MED ORDER — METFORMIN HCL 500 MG PO TABS
500.0000 mg | ORAL_TABLET | Freq: Two times a day (BID) | ORAL | Status: DC
  Filled 2014-03-05 (×4): qty 1

## 2014-03-05 MED ORDER — NEOSTIGMINE METHYLSULFATE 1 MG/ML IJ SOLN
INTRAMUSCULAR | Status: AC
  Filled 2014-03-05: qty 10

## 2014-03-05 MED ORDER — 0.9 % SODIUM CHLORIDE (POUR BTL) OPTIME
TOPICAL | Status: DC | PRN
  Administered 2014-03-05: 1000 mL

## 2014-03-05 MED ORDER — PANTOPRAZOLE SODIUM 40 MG PO TBEC
40.0000 mg | DELAYED_RELEASE_TABLET | Freq: Every day | ORAL | Status: DC
  Administered 2014-03-05 – 2014-03-06 (×2): 40 mg via ORAL
  Filled 2014-03-05 (×2): qty 1

## 2014-03-05 MED ORDER — HYDROMORPHONE HCL PF 1 MG/ML IJ SOLN
INTRAMUSCULAR | Status: AC
  Filled 2014-03-05: qty 1

## 2014-03-05 MED ORDER — NEOSTIGMINE METHYLSULFATE 1 MG/ML IJ SOLN
INTRAMUSCULAR | Status: DC | PRN
  Administered 2014-03-05: 2 mg via INTRAVENOUS

## 2014-03-05 MED ORDER — ALUM & MAG HYDROXIDE-SIMETH 200-200-20 MG/5ML PO SUSP: 15.0000 mL | ORAL | Status: DC | PRN

## 2014-03-05 MED ORDER — THROMBIN 20000 UNITS EX SOLR
CUTANEOUS | Status: AC
  Filled 2014-03-05: qty 20000

## 2014-03-05 MED ORDER — VANCOMYCIN HCL IN DEXTROSE 750-5 MG/150ML-% IV SOLN
750.0000 mg | Freq: Once | INTRAVENOUS | Status: AC
Start: 2014-03-05 — End: 2014-03-05
  Administered 2014-03-05: 750 mg via INTRAVENOUS
  Filled 2014-03-05: qty 150

## 2014-03-05 MED ORDER — OXCARBAZEPINE 150 MG PO TABS
150.0000 mg | ORAL_TABLET | Freq: Three times a day (TID) | ORAL | Status: DC
  Administered 2014-03-05 – 2014-03-06 (×3): 150 mg via ORAL
  Filled 2014-03-05 (×5): qty 1

## 2014-03-05 MED ORDER — GLYCOPYRROLATE 0.2 MG/ML IJ SOLN
INTRAMUSCULAR | Status: AC
  Filled 2014-03-05: qty 4

## 2014-03-05 MED ORDER — VITAMIN D3 25 MCG (1000 UNIT) PO TABS
1000.0000 [IU] | ORAL_TABLET | Freq: Every day | ORAL | Status: DC
Start: 2014-03-05 — End: 2014-03-06
  Administered 2014-03-05 – 2014-03-06 (×2): 1000 [IU] via ORAL
  Filled 2014-03-05 (×2): qty 1

## 2014-03-05 MED ORDER — SENNOSIDES-DOCUSATE SODIUM 8.6-50 MG PO TABS
1.0000 | ORAL_TABLET | Freq: Every evening | ORAL | Status: DC | PRN
  Filled 2014-03-05: qty 1

## 2014-03-05 MED ORDER — GABAPENTIN 400 MG PO CAPS
400.0000 mg | ORAL_CAPSULE | Freq: Three times a day (TID) | ORAL | Status: DC
  Administered 2014-03-05 – 2014-03-06 (×3): 400 mg via ORAL
  Filled 2014-03-05 (×5): qty 1

## 2014-03-05 MED ORDER — DIPHENHYDRAMINE HCL 25 MG PO CAPS: 25.0000 mg | ORAL_CAPSULE | Freq: Four times a day (QID) | ORAL | Status: DC | PRN

## 2014-03-05 MED ORDER — BISACODYL 5 MG PO TBEC
5.0000 mg | DELAYED_RELEASE_TABLET | Freq: Every day | ORAL | Status: DC | PRN
  Filled 2014-03-05: qty 1

## 2014-03-05 MED ORDER — ALPRAZOLAM 0.5 MG PO TABS
0.5000 mg | ORAL_TABLET | Freq: Two times a day (BID) | ORAL | Status: DC
  Administered 2014-03-05 – 2014-03-06 (×2): 0.5 mg via ORAL
  Filled 2014-03-05 (×2): qty 1

## 2014-03-05 MED ORDER — IPRATROPIUM-ALBUTEROL 0.5-2.5 (3) MG/3ML IN SOLN: 3.0000 mL | Freq: Every day | RESPIRATORY_TRACT | Status: DC | PRN

## 2014-03-05 MED ORDER — DIAZEPAM 5 MG PO TABS
5.0000 mg | ORAL_TABLET | Freq: Four times a day (QID) | ORAL | Status: DC | PRN
  Administered 2014-03-05 – 2014-03-06 (×3): 5 mg via ORAL
  Filled 2014-03-05 (×3): qty 1

## 2014-03-05 MED ORDER — PHENYLEPHRINE HCL 10 MG/ML IJ SOLN
10.0000 mg | INTRAVENOUS | Status: DC | PRN
  Administered 2014-03-05: 30 ug/min via INTRAVENOUS

## 2014-03-05 MED ORDER — SODIUM CHLORIDE 0.9 % IJ SOLN: 3.0000 mL | INTRAMUSCULAR | Status: DC | PRN

## 2014-03-05 MED ORDER — LACTATED RINGERS IV SOLN
INTRAVENOUS | Status: DC | PRN
  Administered 2014-03-05 (×2): via INTRAVENOUS

## 2014-03-05 MED ORDER — HYDROMORPHONE HCL PF 1 MG/ML IJ SOLN
0.5000 mg | INTRAMUSCULAR | Status: DC | PRN
  Administered 2014-03-05 – 2014-03-06 (×3): 1 mg via INTRAVENOUS
  Filled 2014-03-05 (×3): qty 1

## 2014-03-05 MED ORDER — DEXAMETHASONE SODIUM PHOSPHATE 10 MG/ML IJ SOLN
INTRAMUSCULAR | Status: DC | PRN
  Administered 2014-03-05: 4 mg via INTRAVENOUS

## 2014-03-05 MED ORDER — PIOGLITAZONE HCL 15 MG PO TABS
15.0000 mg | ORAL_TABLET | Freq: Every day | ORAL | Status: DC
  Administered 2014-03-05 – 2014-03-06 (×2): 15 mg via ORAL
  Filled 2014-03-05 (×2): qty 1

## 2014-03-05 MED ORDER — MIDAZOLAM HCL 5 MG/5ML IJ SOLN
INTRAMUSCULAR | Status: DC | PRN
  Administered 2014-03-05: 2 mg via INTRAVENOUS

## 2014-03-05 MED ORDER — MIDAZOLAM HCL 2 MG/2ML IJ SOLN
INTRAMUSCULAR | Status: AC
  Filled 2014-03-05: qty 2

## 2014-03-05 MED ORDER — PHENYLEPHRINE 40 MCG/ML (10ML) SYRINGE FOR IV PUSH (FOR BLOOD PRESSURE SUPPORT)
PREFILLED_SYRINGE | INTRAVENOUS | Status: AC
  Filled 2014-03-05: qty 10

## 2014-03-05 MED ORDER — LORATADINE 10 MG PO TABS
10.0000 mg | ORAL_TABLET | Freq: Every day | ORAL | Status: DC
  Administered 2014-03-05 – 2014-03-06 (×2): 10 mg via ORAL
  Filled 2014-03-05 (×2): qty 1

## 2014-03-05 MED ORDER — METOPROLOL TARTRATE 25 MG PO TABS
25.0000 mg | ORAL_TABLET | Freq: Once | ORAL | Status: AC
  Administered 2014-03-05: 25 mg via ORAL
  Filled 2014-03-05: qty 1

## 2014-03-05 MED ORDER — ROCURONIUM BROMIDE 50 MG/5ML IV SOLN
INTRAVENOUS | Status: AC
  Filled 2014-03-05: qty 1

## 2014-03-05 MED ORDER — METOPROLOL TARTRATE 25 MG PO TABS
25.0000 mg | ORAL_TABLET | Freq: Two times a day (BID) | ORAL | Status: DC
  Administered 2014-03-05 – 2014-03-06 (×2): 25 mg via ORAL
  Filled 2014-03-05 (×3): qty 1

## 2014-03-05 MED ORDER — HYDROMORPHONE HCL PF 1 MG/ML IJ SOLN
INTRAMUSCULAR | Status: AC
  Administered 2014-03-05: 0.5 mg
  Filled 2014-03-05: qty 1

## 2014-03-05 MED ORDER — ONDANSETRON HCL 4 MG/2ML IJ SOLN
INTRAMUSCULAR | Status: AC
  Filled 2014-03-05: qty 2

## 2014-03-05 SURGICAL SUPPLY — 78 items
BENZOIN TINCTURE PRP APPL 2/3 (GAUZE/BANDAGES/DRESSINGS) ×3 IMPLANT
BIT DRILL NEURO 2X3.1 SFT TUCH (MISCELLANEOUS) ×1 IMPLANT
BIT DRILL SKYLINE 12 (BIT) ×2 IMPLANT
BIT DRILL SKYLINE 12MM (BIT) ×1
BLADE SURG 15 STRL LF DISP TIS (BLADE) IMPLANT
BLADE SURG 15 STRL SS (BLADE)
BLADE SURG ROTATE 9660 (MISCELLANEOUS) IMPLANT
BUR MATCHSTICK NEURO 3.0 LAGG (BURR) IMPLANT
CANISTER SUCTION WELLS/JOHNSON (MISCELLANEOUS) ×3 IMPLANT
CARTRIDGE OIL MAESTRO DRILL (MISCELLANEOUS) ×1 IMPLANT
CLOSURE WOUND 1/2 X4 (GAUZE/BANDAGES/DRESSINGS) ×1
COLLAR CERV LO CONTOUR FIRM DE (SOFTGOODS) IMPLANT
CORDS BIPOLAR (ELECTRODE) ×3 IMPLANT
COVER SURGICAL LIGHT HANDLE (MISCELLANEOUS) ×3 IMPLANT
CRADLE DONUT ADULT HEAD (MISCELLANEOUS) ×3 IMPLANT
DEVICE ENDSKLTN IMPLNT MED 5MM (Endomechanicals) ×1 IMPLANT
DIFFUSER DRILL AIR PNEUMATIC (MISCELLANEOUS) ×3 IMPLANT
DRAIN JACKSON RD 7FR 3/32 (WOUND CARE) IMPLANT
DRAPE C-ARM 42X72 X-RAY (DRAPES) ×3 IMPLANT
DRAPE POUCH INSTRU U-SHP 10X18 (DRAPES) ×3 IMPLANT
DRAPE SURG 17X23 STRL (DRAPES) ×9 IMPLANT
DRILL NEURO 2X3.1 SOFT TOUCH (MISCELLANEOUS) ×3
DURAPREP 26ML APPLICATOR (WOUND CARE) ×3 IMPLANT
ELECT COATED BLADE 2.86 ST (ELECTRODE) ×3 IMPLANT
ELECT REM PT RETURN 9FT ADLT (ELECTROSURGICAL) ×3
ELECTRODE REM PT RTRN 9FT ADLT (ELECTROSURGICAL) ×1 IMPLANT
ENDOSKELETON IMPLANT MED 5MM (Endomechanicals) ×3 IMPLANT
EVACUATOR SILICONE 100CC (DRAIN) IMPLANT
GAUZE SPONGE 4X4 16PLY XRAY LF (GAUZE/BANDAGES/DRESSINGS) ×3 IMPLANT
GLOVE BIO SURGEON STRL SZ7 (GLOVE) ×3 IMPLANT
GLOVE BIO SURGEON STRL SZ8 (GLOVE) ×3 IMPLANT
GLOVE BIOGEL PI IND STRL 7.5 (GLOVE) ×1 IMPLANT
GLOVE BIOGEL PI IND STRL 8 (GLOVE) ×1 IMPLANT
GLOVE BIOGEL PI INDICATOR 7.5 (GLOVE) ×2
GLOVE BIOGEL PI INDICATOR 8 (GLOVE) ×2
GLOVE BIOGEL PI ORTHO PRO SZ7 (GLOVE) ×4
GLOVE PI ORTHO PRO STRL SZ7 (GLOVE) ×2 IMPLANT
GLOVE SURG SS PI 7.0 STRL IVOR (GLOVE) ×6 IMPLANT
GOWN STRL REUS W/ TWL LRG LVL3 (GOWN DISPOSABLE) ×1 IMPLANT
GOWN STRL REUS W/ TWL XL LVL3 (GOWN DISPOSABLE) ×3 IMPLANT
GOWN STRL REUS W/TWL LRG LVL3 (GOWN DISPOSABLE) ×2
GOWN STRL REUS W/TWL XL LVL3 (GOWN DISPOSABLE) ×6
INTERLOCK LRDTC CRVCL VBR 6MM (Bone Implant) ×1 IMPLANT
IV CATH 14GX2 1/4 (CATHETERS) ×3 IMPLANT
KIT BASIN OR (CUSTOM PROCEDURE TRAY) ×3 IMPLANT
KIT ROOM TURNOVER OR (KITS) ×3 IMPLANT
LORDOTIC CERVICAL VBR 6MM SM (Bone Implant) ×3 IMPLANT
MANIFOLD NEPTUNE II (INSTRUMENTS) IMPLANT
NEEDLE 27GAX1X1/2 (NEEDLE) ×3 IMPLANT
NEEDLE SPNL 20GX3.5 QUINCKE YW (NEEDLE) ×3 IMPLANT
NS IRRIG 1000ML POUR BTL (IV SOLUTION) ×3 IMPLANT
OIL CARTRIDGE MAESTRO DRILL (MISCELLANEOUS) ×3
PACK ORTHO CERVICAL (CUSTOM PROCEDURE TRAY) ×3 IMPLANT
PAD ARMBOARD 7.5X6 YLW CONV (MISCELLANEOUS) ×6 IMPLANT
PATTIES SURGICAL .5 X.5 (GAUZE/BANDAGES/DRESSINGS) IMPLANT
PATTIES SURGICAL .5 X1 (DISPOSABLE) ×3 IMPLANT
PIN DISTRACTION 14 (PIN) ×3 IMPLANT
PLATE SKYLINE 2LVL 26MM (Plate) ×3 IMPLANT
PUTTY BONE DBX 2.5 MIS (Bone Implant) ×3 IMPLANT
SCREW VAR SELF TAP SKYLINE 14M (Screw) ×3 IMPLANT
SPONGE GAUZE 4X4 12PLY (GAUZE/BANDAGES/DRESSINGS) ×3 IMPLANT
SPONGE INTESTINAL PEANUT (DISPOSABLE) ×3 IMPLANT
SPONGE SURGIFOAM ABS GEL 100 (HEMOSTASIS) ×3 IMPLANT
STRIP CLOSURE SKIN 1/2X4 (GAUZE/BANDAGES/DRESSINGS) ×2 IMPLANT
SURGIFLO TRUKIT (HEMOSTASIS) IMPLANT
SUT MNCRL AB 4-0 PS2 18 (SUTURE) ×3 IMPLANT
SUT SILK 4 0 (SUTURE)
SUT SILK 4-0 18XBRD TIE 12 (SUTURE) IMPLANT
SUT VIC AB 2-0 CT2 18 VCP726D (SUTURE) ×3 IMPLANT
SYR BULB IRRIGATION 50ML (SYRINGE) ×3 IMPLANT
SYR CONTROL 10ML LL (SYRINGE) ×6 IMPLANT
TAPE CLOTH 4X10 WHT NS (GAUZE/BANDAGES/DRESSINGS) ×3 IMPLANT
TAPE UMBILICAL COTTON 1/8X30 (MISCELLANEOUS) ×3 IMPLANT
TOWEL OR 17X24 6PK STRL BLUE (TOWEL DISPOSABLE) ×3 IMPLANT
TOWEL OR 17X26 10 PK STRL BLUE (TOWEL DISPOSABLE) ×3 IMPLANT
TRAY FOLEY CATH 16FR SILVER (SET/KITS/TRAYS/PACK) ×3 IMPLANT
WATER STERILE IRR 1000ML POUR (IV SOLUTION) IMPLANT
YANKAUER SUCT BULB TIP NO VENT (SUCTIONS) ×3 IMPLANT

## 2014-03-05 NOTE — Anesthesia Procedure Notes (Signed)
Procedure Name: Intubation Date/Time: 03/05/2014 9:28 AM Performed by: Raphael Gibney T Pre-anesthesia Checklist: Patient identified, Timeout performed, Emergency Drugs available, Suction available and Patient being monitored Patient Re-evaluated:Patient Re-evaluated prior to inductionOxygen Delivery Method: Circle system utilized and Simple face mask Preoxygenation: Pre-oxygenation with 100% oxygen Intubation Type: IV induction Ventilation: Mask ventilation without difficulty and Oral airway inserted - appropriate to patient size Laryngoscope size: elective glidescope. Grade View: Grade I Tube type: Oral Tube size: 7.5 mm Number of attempts: 1 Airway Equipment and Method: Patient positioned with wedge pillow,  Stylet and Video-laryngoscopy Placement Confirmation: ETT inserted through vocal cords under direct vision,  positive ETCO2 and breath sounds checked- equal and bilateral Secured at: 23 cm Tube secured with: Tape Dental Injury: Teeth and Oropharynx as per pre-operative assessment

## 2014-03-05 NOTE — Progress Notes (Signed)
Orthopedic Tech Progress Note Patient Details:  [name] [name] 04/17/1951 [id number] Spoke with nursing staff; patient already has hard collar. No action needed from Ortho Tech at this time. Patient ID: [name] [name], female   DOB: 23-Oct-1951, 63 y.o.   MRN: [id number]   [name] [name] 03/05/2014, 1:59 PM

## 2014-03-05 NOTE — Progress Notes (Signed)
Care of pt assumed by [facility] RN from P. Eugenio Hoes RN

## 2014-03-05 NOTE — Progress Notes (Signed)
ANTIBIOTIC CONSULT NOTE - INITIAL  Pharmacy Consult for vancomycin Indication: post-op for  Cervical decompression/discectomy fusion  Allergies  Allergen Reactions  . Haldol [Haloperidol Decanoate] Other (See Comments)    tongue swelling  . Acetaminophen Other (See Comments)    Stomach pain  . Ibuprofen Other (See Comments)    Stomach pain  . Metronidazole Other (See Comments)    Severe stomach upset--was instructed to avoid Flagyl.  . Morphine And Related Hives    hives  . Penicillins Rash    Patient Measurements: Weight: 152 lb (68.947 kg)   Vital Signs: Temp: 98.2 F (36.8 C) (04/16 1300) Temp src: Oral (04/16 0625) BP: 146/85 mmHg (04/16 1340) Pulse Rate: 84 (04/16 1340) Intake/Output from previous day:   Intake/Output from this shift: Total I/O In: 1100 [I.V.:1100] Out: 50 [Blood:50]  Labs: No results found for this basename: WBC, HGB, PLT, LABCREA, CREATININE,  in the last 72 hours The CrCl is unknown because both a height and weight (above a minimum accepted value) are required for this calculation. No results found for this basename: VANCOTROUGH, Corlis Leak, VANCORANDOM, GENTTROUGH, GENTPEAK, GENTRANDOM, TOBRATROUGH, TOBRAPEAK, TOBRARND, AMIKACINPEAK, AMIKACINTROU, AMIKACIN,  in the last 72 hours   Microbiology: Recent Results (from the past 720 hour(s))  CLOSTRIDIUM DIFFICILE BY PCR     Status: None   Collection Time    02/07/14  9:27 PM      Result Value Ref Range Status   C difficile by pcr NEGATIVE  NEGATIVE Final  SURGICAL PCR SCREEN     Status: None   Collection Time    02/26/14  1:56 PM      Result Value Ref Range Status   MRSA, PCR NEGATIVE  NEGATIVE Final   Staphylococcus aureus NEGATIVE  NEGATIVE Final   Comment:            The Xpert SA Assay (FDA     approved for NASAL specimens     in patients over 26 years of age),     is one component of     a comprehensive surveillance     program.  Test performance has     been validated by [employer] for patients greater     than or equal to 65 year old.     It is not intended     to diagnose infection nor to     guide or monitor treatment.    Medical History: Past Medical History  Diagnosis Date  . Hypertension   . Diabetes mellitus   . Bipolar 1 disorder   . Heart murmur   . Sciatica   . Arthritis   . Anxiety   . Diverticulosis   . Chronic low back pain   . DDD (degenerative disc disease), lumbar   . Chronic shoulder pain   . Chronic neck pain   . Hepatitis B     Unclear when initially diagnosed, labs in Epic from 03/09/13  . Hepatitis C     Unclear when initially diagnosed, labs in Epic from 03/09/13  . C. difficile diarrhea     April and February 2014  . Adenocarcinoma of lung, stage 1   . COPD (chronic obstructive pulmonary disease)   . GERD (gastroesophageal reflux disease)   . H/O hiatal hernia   . Headache(784.0)     hx  . PONV (postoperative nausea and vomiting)   . Seizures     on meds    Medications:  See med  rec  Assessment: [name] a 63 y.o F s/p spinal surgery to give vancomycin post-op for prophylaxis.  RN started that patient does not have drain in place.  Goal of Therapy:  Vancomycin trough level 15-20 mcg/ml  Plan:  1) vancomycin 750mg  IV x1 at 8PM tonight.   2) pharmacy will sign off  [place] 03/05/2014,2:17 PM

## 2014-03-05 NOTE — Progress Notes (Signed)
Utilization review completed.  

## 2014-03-05 NOTE — Progress Notes (Signed)
Lunch relief by M. Brande RN 

## 2014-03-05 NOTE — Anesthesia Preprocedure Evaluation (Addendum)
Anesthesia Evaluation  Patient identified by MRN, date of birth, ID band Patient awake    Reviewed: Allergy & Precautions, H&P , NPO status , Patient's Chart, lab work & pertinent test results, reviewed documented beta blocker date and time   History of Anesthesia Complications (+) PONV and history of anesthetic complications  Airway Mallampati: II TM Distance: >3 FB Neck ROM: Full    Dental  (+) Edentulous Upper, Edentulous Lower   Pulmonary former smoker,  breath sounds clear to auscultation        Cardiovascular hypertension, Pt. on medications and Pt. on home beta blockers Rhythm:Regular     Neuro/Psych  Headaches, Seizures -, Well Controlled,  Anxiety    GI/Hepatic GERD-  Controlled,  Endo/Other  diabetes, Well Controlled, Type 2  Renal/GU      Musculoskeletal   Abdominal   Peds  Hematology   Anesthesia Other Findings   Reproductive/Obstetrics                          Anesthesia Physical Anesthesia Plan  ASA: III  Anesthesia Plan: General   Post-op Pain Management:    Induction: Intravenous  Airway Management Planned: Video Laryngoscope Planned  Additional Equipment:   Intra-op Plan:   Post-operative Plan: Extubation in OR  Informed Consent: I have reviewed the patients History and Physical, chart, labs and discussed the procedure including the risks, benefits and alternatives for the proposed anesthesia with the patient or authorized representative who has indicated his/her understanding and acceptance.   Dental advisory given  Plan Discussed with: CRNA, Anesthesiologist and Surgeon  Anesthesia Plan Comments:         Anesthesia Quick Evaluation

## 2014-03-05 NOTE — Transfer of Care (Signed)
Immediate Anesthesia Transfer of Care Note  Patient: [name] [name]  Procedure(s) Performed: Procedure(s) with comments: ANTERIOR CERVICAL DECOMPRESSION/DISCECTOMY FUSION 2 LEVELS (N/A) - Anterior cervical decompression fusion, cervical 4-5, cervical 5-6 with instrumentation, allograft.  Patient Location: PACU  Anesthesia Type:General  Level of Consciousness: awake, alert  and oriented  Airway & Oxygen Therapy: Patient Spontanous Breathing and Patient connected to nasal cannula oxygen  Post-op Assessment: Report given to PACU RN, Post -op Vital signs reviewed and stable and Patient moving all extremities X 4  Post vital signs: Reviewed and stable  Complications: No apparent anesthesia complications

## 2014-03-05 NOTE — Anesthesia Postprocedure Evaluation (Signed)
  Anesthesia Post-op Note  Patient: [name] [name]  Procedure(s) Performed: Procedure(s) with comments: ANTERIOR CERVICAL DECOMPRESSION/DISCECTOMY FUSION 2 LEVELS (N/A) - Anterior cervical decompression fusion, cervical 4-5, cervical 5-6 with instrumentation, allograft.  Patient Location: PACU  Anesthesia Type:General  Level of Consciousness: awake, alert  and oriented  Airway and Oxygen Therapy: Patient Spontanous Breathing  Post-op Pain: moderate  Post-op Assessment: Post-op Vital signs reviewed, Patient's Cardiovascular Status Stable, Respiratory Function Stable, Patent Airway, No signs of Nausea or vomiting and Pain level controlled  Post-op Vital Signs: Reviewed and stable  Last Vitals:  Filed Vitals:   03/05/14 1340  BP: 146/85  Pulse: 84  Temp:   Resp: 16    Complications: No apparent anesthesia complications

## 2014-03-06 LAB — GLUCOSE, CAPILLARY: GLUCOSE-CAPILLARY: 99 mg/dL (ref 70–99)

## 2014-03-06 NOTE — Op Note (Signed)
[name], [name]             ACCOUNT NO.:  [id number]  MEDICAL RECORD NO.:  [id number]  LOCATION:  [id number]                        FACILITY:  [facility]  PHYSICIAN:  Phylliss Bob, MD      DATE OF BIRTH:  08/02/1951  DATE OF PROCEDURE:  03/05/2014                              OPERATIVE REPORT   PREOPERATIVE DIAGNOSES: 1. Left-sided cervical radiculopathy. 2. C4-5, C5-6 degenerative disk disease with left-sided neural     foraminal stenosis.  POSTOPERATIVE DIAGNOSES: 1. Left-sided cervical radiculopathy. 2. C4-5, C5-6 degenerative disk disease with left-sided neural     foraminal stenosis.  PROCEDURES: 1. Anterior cervical decompression and fusion C4-5, C5-6. 2. Placement of anterior instrumentation, C4 to C6. 3. Insertion of interbody device x2 (tightened interbody spacers). 4. Use of morselized allograft. 5. Intraoperative use of fluoroscopy.  SURGEON:  Phylliss Bob, MD  ASSISTANT:  Pricilla Holm, [facility]  ANESTHESIA:  General endotracheal anesthesia.  COMPLICATIONS:  None.  DISPOSITION:  Stable.  ESTIMATED BLOOD LOSS:  Minimal.  INDICATIONS FOR PROCEDURE:  Briefly, [name] [name] is a 63 year old female who did present to me with ongoing and severe pain in the left arm.  She did also report weakness.  She did feel that the location of her pain did involve the anterior and lateral aspect of the arm and did go into her thumb and index finger.  An MRI did reveal left-sided neural foraminal stenosis at the C4-5 and C5-6 levels.  I did feel that these findings do correlate to her pain distribution.  The patient failed conservative care and we did discuss the procedure reflected above.  The patient did elect to proceed.  DESCRIPTION OF PROCEDURE:  On March 05, 2014, the patient was brought to surgery, and general endotracheal anesthesia was administered.  The patient was placed prone on flat Jackson bed.  Gel rolls were placed under the patient's chest and hips.  The patient's  arms were secured to her sides.  The neck was prepped and draped in the usual sterile fashion.  I then made a left-sided transverse incision overlying the C5 vertebral body.  The platysma was sharply incised.  A plane between the esophagus and the carotid artery was developed and the anterior spine was noted.  A lateral intraoperative fluoroscopic view did confirm the appropriate operative level.  I then subperiosteally exposed the vertebral bodies of C4, C5, and C6.  I then placed a self-retaining retractor centered over the C5-6 interspace.  Osteophytes were identified and removed.  A diskectomy was performed in the usual manner using a 15-blade knife followed by a series of curettes and pituitary rongeurs.  The posterior longitudinal ligament was identified and entered using a nerve hook.  I then used #1 followed by #2 Kerrison to perform a thorough central and left-sided neural foraminal decompression.  The endplates were prepared and the appropriate-sized interbody spacer was packed with DBX mix and tamped into position in the usual fashion.  Of note, I did place Caspar pins into the C5 and C6 vertebral bodies and distraction was applied, prior to performing the diskectomy.  The C6 vertebral body, Caspar pin was removed after the implant was placed.  Bone wax was placed in its place.  I then placed the vertebral body pin in the C4 vertebral body and distraction was applied.  Again, a diskectomy was performed in the manner previously noted.  The decompression was confirmed using a nerve hook.  I then prepared the endplates and placed the appropriate size interbody tightened spacer into position with DBX mix.  I was very pleased with the press fit of each of the implants.  The osteophytes were again removed from the C4-5 level.  The appropriate-sized anterior cervical plate was placed over the anterior cervical spine.  A 14 mm variable angle screws were placed, 2 in each vertebral body  from C4 to C6 for a total of 6 vertebral body screws.  The screws were locked to the plate using the CAM locking mechanism.  The wound was then explored for any undue bleeding and none was encountered.  I then obtained intraoperative fluoroscopy while placing the hardware.  I was very pleased with the final fluoroscopic images.  The platysma was then closed using 2-0 Vicryl.  The skin was then closed using 4-0 Monocryl.  Benzoin and Steri- Strips were applied followed by sterile dressing.  All instrument counts were correct at the termination of the procedure.  Pricilla Holm was my assistant throughout the surgery and did aid in retraction, suctioning, and closure.     Phylliss Bob, MD     MD/MEDQ  D:  03/05/2014  T:  03/06/2014  Job:  [id number]

## 2014-03-06 NOTE — Progress Notes (Signed)
Pt given D/C instructions with Rx's, verbal understanding was given. All of the Pt's questions were answered prior to D/C. Pt D/C'd home via wheelchair @ 1055 per MD order. Pt is stable @ D/C and has no other needs. Pt's IJ line was removed by IV team prior to D/C. Pt received [place] collar from home use prior to D/C per MD order. Holli Humbles, RN

## 2014-03-06 NOTE — Progress Notes (Signed)
PO Day 1 C4-6 ACDF doing well, resolved left arm pain, moderate posterior neck pain and spasming. Reports some difficulty sleeping in collar otherwise doing very well.   BP 135/75  Pulse 78  Temp(Src) 98.7 F (37.1 C) (Oral)  Resp 17  Wt 68.947 kg (152 lb)  SpO2 99%  Pt resting comfortably in hospital bed, hard collar slightly loose, re-tightened. Dressing CDI, NVI, SCD's in place.   PO Day 1 C4-6 ACDF  -Resolved L arm pain  -Expected posterior neck pain/spasm   -continue current medications  -D/C home today    -D/C orders in chart   -D/C signed scripts in chart   -F/U in office 2 weeks  -[place] collar to bedside for shower   -D/C central line

## 2014-03-09 ENCOUNTER — Encounter (HOSPITAL_COMMUNITY): Payer: Self-pay | Admitting: Orthopedic Surgery

## 2014-03-12 ENCOUNTER — Other Ambulatory Visit: Payer: Self-pay

## 2014-03-12 ENCOUNTER — Ambulatory Visit: Payer: Medicare Other | Admitting: Cardiothoracic Surgery

## 2014-03-12 NOTE — Discharge Summary (Signed)
Patient ID: [name] [name] MRN: [id number] DOB/AGE: Feb 24, 1951 63 y.o.  Admit date: 03/05/2014 Discharge date: 03/06/2014  Admission Diagnoses:  Active Problems:   Radiculopathy   Discharge Diagnoses:  Same  Past Medical History  Diagnosis Date  . Hypertension   . Diabetes mellitus   . Bipolar 1 disorder   . Heart murmur   . Sciatica   . Arthritis   . Anxiety   . Diverticulosis   . Chronic low back pain   . DDD (degenerative disc disease), lumbar   . Chronic shoulder pain   . Chronic neck pain   . Hepatitis B     Unclear when initially diagnosed, labs in Epic from 03/09/13  . Hepatitis C     Unclear when initially diagnosed, labs in Epic from 03/09/13  . C. difficile diarrhea     April and February 2014  . Adenocarcinoma of lung, stage 1   . COPD (chronic obstructive pulmonary disease)   . GERD (gastroesophageal reflux disease)   . H/O hiatal hernia   . Headache(784.0)     hx  . PONV (postoperative nausea and vomiting)   . Seizures     on meds    Surgeries: Procedure(s): ANTERIOR CERVICAL DECOMPRESSION/DISCECTOMY FUSION 2 LEVELS C4-6 on 03/05/2014   Discharged Condition: Improved  Hospital Course: [name] [name] is an 63 y.o. female who was admitted 03/05/2014 for operative treatment of radiculopathy. Patient has severe unremitting pain that affects sleep, daily activities, and work/hobbies. After pre-op clearance the patient was taken to the operating room on 03/05/2014 and underwent  Procedure(s): ANTERIOR CERVICAL DECOMPRESSION/DISCECTOMY FUSION 2 LEVELS C4-6.    Patient was given perioperative antibiotics:  Anti-infectives   Start     Dose/Rate Route Frequency Ordered Stop   03/05/14 2000  vancomycin (VANCOCIN) IVPB 750 mg/150 ml premix     750 mg 150 mL/hr over 60 Minutes Intravenous  Once 03/05/14 1428 03/05/14 2106   03/05/14 0600  vancomycin (VANCOCIN) IVPB 1000 mg/200 mL premix     1,000 mg 200 mL/hr over 60 Minutes Intravenous On call to O.R. 03/04/14  1402 03/05/14 0910       Patient was given sequential compression devices, early ambulation to prevent DVT.  Patient benefited maximally from hospital stay and there were no complications.    Recent vital signs: BP 150/76  Pulse 70  Temp(Src) 98.8 F (37.1 C) (Oral)  Resp 16  Wt 68.947 kg (152 lb)  SpO2 96%   Discharge Medications:     Medication List    STOP taking these medications       aspirin EC 81 MG tablet     HYDROcodone-acetaminophen 10-325 MG per tablet  Commonly known as:  NORCO      TAKE these medications       ALPRAZolam 1 MG tablet  Commonly known as:  XANAX  Take 0.5 mg by mouth 2 (two) times daily.     alum & mag hydroxide-simeth 200-200-20 MG/5ML suspension  Commonly known as:  MAALOX/MYLANTA  Take by mouth 3 (three) times daily as needed for indigestion or heartburn (stomach pain).     COMBIVENT RESPIMAT 20-100 MCG/ACT Aers respimat  Generic drug:  Ipratropium-Albuterol  Inhale 2 puffs into the lungs daily as needed for wheezing or shortness of breath.     dicyclomine 20 MG tablet  Commonly known as:  BENTYL  Take 20 mg by mouth 3 (three) times daily with meals.     gabapentin 400 MG capsule  Commonly known as:  NEURONTIN  Take 400 mg by mouth 3 (three) times daily. Nerve pain     loratadine 10 MG tablet  Commonly known as:  CLARITIN  Take 10 mg by mouth daily.     metFORMIN 500 MG tablet  Commonly known as:  GLUCOPHAGE  Take 500 mg by mouth 2 (two) times daily with a meal.     methocarbamol 500 MG tablet  Commonly known as:  ROBAXIN  Take 500 mg by mouth every 8 (eight) hours as needed for muscle spasms.     metoprolol tartrate 25 MG tablet  Commonly known as:  LOPRESSOR  Take 25 mg by mouth 2 (two) times daily.     MUSCLE RUB 10-15 % Crea  Apply 1 application topically every other day. Left shoulder pain     olmesartan-hydrochlorothiazide 40-25 MG per tablet  Commonly known as:  BENICAR HCT  Take 1 tablet by mouth daily.      omeprazole 20 MG capsule  Commonly known as:  PRILOSEC  Take 20 mg by mouth 2 (two) times daily before a meal.     OXcarbazepine 150 MG tablet  Commonly known as:  TRILEPTAL  Take 150 mg by mouth 3 (three) times daily.     pioglitazone 15 MG tablet  Commonly known as:  ACTOS  Take 15 mg by mouth daily.     simvastatin 20 MG tablet  Commonly known as:  ZOCOR  Take 20 mg by mouth at bedtime.     VITAMIN B-12 IJ  Inject as directed every 30 (thirty) days. Administered by Dr. York Ram between the 26th and 29th of each month     VITAMIN D PO  Take 1 tablet by mouth once a week. monday        Diagnostic Studies: Dg Cervical Spine 2-3 Views  03/05/2014   CLINICAL DATA:  Anterior cervical spine fusion  EXAM: DG C-ARM 1-60 MIN; CERVICAL SPINE - 2-3 VIEW  : COMPARISON:  None  FINDINGS: Single, lateral portable image of the cervical spine demonstrates an anterior fusion plate and fixation screws spanning C4 thin C6. Intervertebral cages maintain disc height. Orthopedic hardware is well-seated and aligned. No evidence of an operative complication.  IMPRESSION: Anterior cervical spine fusion. Please refer to the procedure report for complete description.   Electronically Signed   By: Lajean Manes M.D.   On: 03/05/2014 13:47   Dg Chest Port 1 View  03/05/2014   CLINICAL DATA:  Right IJ catheter placement  EXAM: PORTABLE CHEST - 1 VIEW  COMPARISON:  12/10/2013  FINDINGS: Right internal jugular dual-lumen central venous catheter has its tip in the lower superior vena cava. There is no pneumothorax.  There are stable changes from previous right lung surgery. Mild linear atelectasis or scarring is noted in the left lower lung. Lungs are otherwise clear. Cardiac silhouette is normal in size. Normal mediastinal and hilar contours.  IMPRESSION: 1. Right IJ central venous catheter has its tip in the lower superior vena cava. No pneumothorax. 2. No acute cardiopulmonary disease.   Electronically  Signed   By: Lajean Manes M.D.   On: 03/05/2014 12:41   Dg C-arm 1-60 Min  03/05/2014   CLINICAL DATA:  Anterior cervical spine fusion  EXAM: DG C-ARM 1-60 MIN; CERVICAL SPINE - 2-3 VIEW  : COMPARISON:  None  FINDINGS: Single, lateral portable image of the cervical spine demonstrates an anterior fusion plate and fixation screws spanning C4 thin C6. Intervertebral cages maintain disc height. Orthopedic hardware  is well-seated and aligned. No evidence of an operative complication.  IMPRESSION: Anterior cervical spine fusion. Please refer to the procedure report for complete description.   Electronically Signed   By: Lajean Manes M.D.   On: 03/05/2014 13:47    Disposition: 01-Home or Self Care       Future Appointments Provider Department Dept Phone   03/12/2014 11:30 AM Grace Isaac, MD [facility] [place] [phone]     PO Day 1 C4-6 ACDF  -Resolved L arm pain  -Expected posterior neck pain/spasm  -continue current medications  -D/C home today  -D/C orders in chart  -D/C signed scripts in chart  -F/U in office 2 weeks  -[place] collar to bedside for shower  -D/C central line      Signed: Lennie Muckle Lorana Maffeo 03/12/2014, 10:11 AM

## 2014-06-12 ENCOUNTER — Other Ambulatory Visit: Payer: Self-pay | Admitting: *Deleted

## 2014-06-12 DIAGNOSIS — Z85118 Personal history of other malignant neoplasm of bronchus and lung: Secondary | ICD-10-CM

## 2014-06-23 ENCOUNTER — Ambulatory Visit: Payer: Medicare Other | Admitting: Cardiothoracic Surgery

## 2014-06-23 ENCOUNTER — Other Ambulatory Visit: Payer: Self-pay

## 2014-07-01 ENCOUNTER — Ambulatory Visit: Payer: Medicare HMO | Attending: Orthopedic Surgery | Admitting: Physical Therapy

## 2014-07-01 DIAGNOSIS — M542 Cervicalgia: Secondary | ICD-10-CM | POA: Diagnosis not present

## 2014-07-01 DIAGNOSIS — M545 Low back pain, unspecified: Secondary | ICD-10-CM | POA: Diagnosis not present

## 2014-07-07 ENCOUNTER — Ambulatory Visit: Payer: Medicare HMO | Admitting: Physical Therapy

## 2014-07-09 ENCOUNTER — Other Ambulatory Visit: Payer: Self-pay

## 2014-07-09 ENCOUNTER — Ambulatory Visit: Payer: Medicare Other | Admitting: Cardiothoracic Surgery

## 2014-07-10 ENCOUNTER — Ambulatory Visit: Payer: Medicare HMO | Admitting: Physical Therapy

## 2014-07-10 DIAGNOSIS — M542 Cervicalgia: Secondary | ICD-10-CM | POA: Diagnosis not present

## 2014-07-15 ENCOUNTER — Ambulatory Visit: Payer: Medicare HMO | Admitting: Physical Therapy

## 2014-07-15 DIAGNOSIS — M542 Cervicalgia: Secondary | ICD-10-CM | POA: Diagnosis not present

## 2014-07-20 ENCOUNTER — Ambulatory Visit: Payer: Medicare HMO | Admitting: Physical Therapy

## 2014-07-30 ENCOUNTER — Ambulatory Visit: Payer: Medicare Other | Admitting: Cardiothoracic Surgery

## 2014-07-30 ENCOUNTER — Inpatient Hospital Stay: Admission: RE | Admit: 2014-07-30 | Payer: Self-pay | Source: Ambulatory Visit

## 2014-08-06 ENCOUNTER — Ambulatory Visit: Payer: Medicare HMO | Attending: Orthopedic Surgery | Admitting: Physical Therapy

## 2014-08-06 DIAGNOSIS — M545 Low back pain, unspecified: Secondary | ICD-10-CM | POA: Diagnosis not present

## 2014-08-06 DIAGNOSIS — M542 Cervicalgia: Secondary | ICD-10-CM | POA: Diagnosis present

## 2014-08-13 ENCOUNTER — Ambulatory Visit: Payer: Medicare HMO | Admitting: Physical Therapy

## 2014-08-13 DIAGNOSIS — M542 Cervicalgia: Secondary | ICD-10-CM | POA: Diagnosis not present

## 2014-08-20 ENCOUNTER — Emergency Department (HOSPITAL_COMMUNITY)
Admission: EM | Admit: 2014-08-20 | Discharge: 2014-08-20 | Disposition: A | Discharge status: Home / Self Care | Payer: Medicare HMO | Attending: Emergency Medicine | Admitting: Emergency Medicine

## 2014-08-20 ENCOUNTER — Ambulatory Visit: Payer: Medicare HMO | Admitting: Physical Therapy

## 2014-08-20 ENCOUNTER — Ambulatory Visit: Payer: Medicare HMO | Admitting: Cardiothoracic Surgery

## 2014-08-20 ENCOUNTER — Other Ambulatory Visit: Payer: Self-pay

## 2014-08-20 ENCOUNTER — Emergency Department (HOSPITAL_COMMUNITY): Payer: Medicare HMO

## 2014-08-20 ENCOUNTER — Encounter (HOSPITAL_COMMUNITY): Payer: Self-pay | Admitting: Emergency Medicine

## 2014-08-20 DIAGNOSIS — R011 Cardiac murmur, unspecified: Secondary | ICD-10-CM | POA: Insufficient documentation

## 2014-08-20 DIAGNOSIS — G40909 Epilepsy, unspecified, not intractable, without status epilepticus: Secondary | ICD-10-CM | POA: Insufficient documentation

## 2014-08-20 DIAGNOSIS — Z79899 Other long term (current) drug therapy: Secondary | ICD-10-CM | POA: Diagnosis not present

## 2014-08-20 DIAGNOSIS — Z85118 Personal history of other malignant neoplasm of bronchus and lung: Secondary | ICD-10-CM | POA: Diagnosis not present

## 2014-08-20 DIAGNOSIS — J0100 Acute maxillary sinusitis, unspecified: Secondary | ICD-10-CM

## 2014-08-20 DIAGNOSIS — F419 Anxiety disorder, unspecified: Secondary | ICD-10-CM | POA: Diagnosis not present

## 2014-08-20 DIAGNOSIS — E119 Type 2 diabetes mellitus without complications: Secondary | ICD-10-CM | POA: Insufficient documentation

## 2014-08-20 DIAGNOSIS — K219 Gastro-esophageal reflux disease without esophagitis: Secondary | ICD-10-CM | POA: Insufficient documentation

## 2014-08-20 DIAGNOSIS — F319 Bipolar disorder, unspecified: Secondary | ICD-10-CM | POA: Diagnosis not present

## 2014-08-20 DIAGNOSIS — J449 Chronic obstructive pulmonary disease, unspecified: Secondary | ICD-10-CM | POA: Insufficient documentation

## 2014-08-20 DIAGNOSIS — I10 Essential (primary) hypertension: Secondary | ICD-10-CM | POA: Insufficient documentation

## 2014-08-20 DIAGNOSIS — Z880 Allergy status to penicillin: Secondary | ICD-10-CM | POA: Diagnosis not present

## 2014-08-20 DIAGNOSIS — Z87891 Personal history of nicotine dependence: Secondary | ICD-10-CM | POA: Insufficient documentation

## 2014-08-20 DIAGNOSIS — M542 Cervicalgia: Secondary | ICD-10-CM | POA: Insufficient documentation

## 2014-08-20 DIAGNOSIS — G8929 Other chronic pain: Secondary | ICD-10-CM | POA: Diagnosis not present

## 2014-08-20 DIAGNOSIS — Z8619 Personal history of other infectious and parasitic diseases: Secondary | ICD-10-CM | POA: Insufficient documentation

## 2014-08-20 DIAGNOSIS — R05 Cough: Secondary | ICD-10-CM | POA: Diagnosis present

## 2014-08-20 LAB — I-STAT TROPONIN, ED
TROPONIN I, POC: 0.01 ng/mL (ref 0.00–0.08)
Troponin i, poc: 0 ng/mL (ref 0.00–0.08)

## 2014-08-20 LAB — CBC WITH DIFFERENTIAL/PLATELET
Basophils Absolute: 0 10*3/uL (ref 0.0–0.1)
Basophils Relative: 1 % (ref 0–1)
EOS ABS: 0.1 10*3/uL (ref 0.0–0.7)
EOS PCT: 2 % (ref 0–5)
HCT: 43.8 % (ref 36.0–46.0)
Hemoglobin: 14.4 g/dL (ref 12.0–15.0)
Lymphocytes Relative: 31 % (ref 12–46)
Lymphs Abs: 1.9 10*3/uL (ref 0.7–4.0)
MCH: 29.6 pg (ref 26.0–34.0)
MCHC: 32.9 g/dL (ref 30.0–36.0)
MCV: 90.1 fL (ref 78.0–100.0)
Monocytes Absolute: 0.4 10*3/uL (ref 0.1–1.0)
Monocytes Relative: 7 % (ref 3–12)
NEUTROS PCT: 59 % (ref 43–77)
Neutro Abs: 3.5 10*3/uL (ref 1.7–7.7)
Platelets: 249 10*3/uL (ref 150–400)
RBC: 4.86 MIL/uL (ref 3.87–5.11)
RDW: 13.1 % (ref 11.5–15.5)
WBC: 6 10*3/uL (ref 4.0–10.5)

## 2014-08-20 LAB — COMPREHENSIVE METABOLIC PANEL
ALT: 14 U/L (ref 0–35)
AST: 19 U/L (ref 0–37)
Albumin: 4.2 g/dL (ref 3.5–5.2)
Alkaline Phosphatase: 107 U/L (ref 39–117)
Anion gap: 13 (ref 5–15)
BUN: 13 mg/dL (ref 6–23)
CO2: 29 meq/L (ref 19–32)
Calcium: 9.8 mg/dL (ref 8.4–10.5)
Chloride: 99 mEq/L (ref 96–112)
Creatinine, Ser: 0.58 mg/dL (ref 0.50–1.10)
GFR calc Af Amer: >90 mL/min (ref >90)
GFR calc non Af Amer: >90 mL/min (ref >90)
Glucose, Bld: 95 mg/dL (ref 70–99)
Potassium: 3.2 mEq/L — ABNORMAL LOW (ref 3.7–5.3)
SODIUM: 141 meq/L (ref 137–147)
TOTAL PROTEIN: 8.1 g/dL (ref 6.0–8.3)
Total Bilirubin: 0.5 mg/dL (ref 0.3–1.2)

## 2014-08-20 MED ORDER — ONDANSETRON HCL 4 MG/2ML IJ SOLN
4.0000 mg | Freq: Once | INTRAMUSCULAR | Status: AC
  Administered 2014-08-20: 4 mg via INTRAVENOUS
  Filled 2014-08-20: qty 2

## 2014-08-20 MED ORDER — HYDROMORPHONE HCL 1 MG/ML IJ SOLN
1.0000 mg | Freq: Once | INTRAMUSCULAR | Status: AC
  Administered 2014-08-20: 1 mg via INTRAMUSCULAR
  Filled 2014-08-20: qty 1

## 2014-08-20 MED ORDER — SODIUM CHLORIDE 0.9 % IV BOLUS (SEPSIS)
1000.0000 mL | Freq: Once | INTRAVENOUS | Status: AC
  Administered 2014-08-20: 1000 mL via INTRAVENOUS

## 2014-08-20 MED ORDER — KETOROLAC TROMETHAMINE 60 MG/2ML IM SOLN
60.0000 mg | Freq: Once | INTRAMUSCULAR | Status: AC
Start: 2014-08-20 — End: 2014-08-20
  Administered 2014-08-20: 60 mg via INTRAMUSCULAR
  Filled 2014-08-20: qty 2

## 2014-08-20 MED ORDER — HYDROMORPHONE HCL 1 MG/ML IJ SOLN
1.0000 mg | Freq: Once | INTRAMUSCULAR | Status: AC
  Administered 2014-08-20: 1 mg via INTRAVENOUS
  Filled 2014-08-20: qty 1

## 2014-08-20 MED ORDER — DOXYCYCLINE HYCLATE 100 MG PO CAPS: 100.0000 mg | ORAL_CAPSULE | Freq: Two times a day (BID) | ORAL | Status: DC

## 2014-08-20 MED ORDER — DOXYCYCLINE HYCLATE 100 MG PO TABS
100.0000 mg | ORAL_TABLET | Freq: Once | ORAL | Status: AC
  Administered 2014-08-20: 100 mg via ORAL
  Filled 2014-08-20: qty 1

## 2014-08-20 MED ORDER — HYDROMORPHONE HCL 1 MG/ML IJ SOLN: 1.0000 mg | Freq: Once | INTRAMUSCULAR | Status: DC

## 2014-08-20 MED ORDER — IOHEXOL 300 MG/ML  SOLN: 75.0000 mL | Freq: Once | INTRAMUSCULAR | Status: DC | PRN

## 2014-08-20 NOTE — Discharge Instructions (Signed)
Take doxy twice a day for a week.   Stay hydrated.   Follow up with your doctor.   Return to ER if you have worse swelling, chest pain, trouble swallowing.

## 2014-08-20 NOTE — ED Notes (Signed)
Patient transported to CT 

## 2014-08-20 NOTE — ED Notes (Signed)
Unable to collect I-stat x 1 nurse and 1 tech. Phlebotomy to attempt.

## 2014-08-20 NOTE — ED Notes (Signed)
Unable to start IV; other nurse is going to try.

## 2014-08-20 NOTE — ED Notes (Signed)
Pt sts swollen gland in left side of mouth upon waking today

## 2014-08-20 NOTE — ED Notes (Signed)
Patient returned from X-ray 

## 2014-08-20 NOTE — ED Provider Notes (Signed)
CSN: [id number]     Arrival date & time 08/20/14  0831 History   First MD Initiated Contact with Patient 08/20/14 [phone]     Chief Complaint  Patient presents with  . Jaw Pain  . Cough     (Consider location/radiation/quality/duration/timing/severity/associated sxs/prior Treatment) The history is provided by the patient.  [name] [name] is a 63 y.o. female hx of HTN, DM, bipolar here with L face swelling and L neck swelling. Has been having sinus congestion and sinus drainage for several days. Today, woke up and the left side of her neck and face was swollen. Denies fevers. Denies trouble swallowing. Denies chest pain or shortness of breath.    Past Medical History  Diagnosis Date  . Hypertension   . Diabetes mellitus   . Bipolar 1 disorder   . Heart murmur   . Sciatica   . Arthritis   . Anxiety   . Diverticulosis   . Chronic low back pain   . DDD (degenerative disc disease), lumbar   . Chronic shoulder pain   . Chronic neck pain   . Hepatitis B     Unclear when initially diagnosed, labs in Epic from 03/09/13  . Hepatitis C     Unclear when initially diagnosed, labs in Epic from 03/09/13  . C. difficile diarrhea     April and February 2014  . Adenocarcinoma of lung, stage 1   . COPD (chronic obstructive pulmonary disease)   . GERD (gastroesophageal reflux disease)   . H/O hiatal hernia   . Headache(784.0)     hx  . PONV (postoperative nausea and vomiting)   . Seizures     on meds   Past Surgical History  Procedure Laterality Date  . Facial tumor removal Right 2000  . Brain tumor removal  09  . Brain surgery      in [place]  . Video bronchoscopy  12/04/2012    Procedure: VIDEO BRONCHOSCOPY;  Surgeon: Grace Isaac, MD;  [facility];  Service: Thoracic;  Laterality: N/A;  . Video assisted thoracoscopy (vats)/wedge resection  12/04/2012    Procedure: VIDEO ASSISTED THORACOSCOPY (VATS)/WEDGE RESECTION;  Surgeon: Grace Isaac, MD;  Location: [place];   Service: Thoracic;  Laterality: Right;  . Lobectomy  12/04/2012    Procedure: LOBECTOMY;  Surgeon: Grace Isaac, MD;  Location: [place];  Service: Thoracic;  Laterality: Right;  completion of right upper lobectomy and lymph node disection, placement of on q pump  . Tubal ligation    . Tonsillectomy    . Anterior cervical decomp/discectomy fusion N/A 03/05/2014    Procedure: ANTERIOR CERVICAL DECOMPRESSION/DISCECTOMY FUSION 2 LEVELS;  Surgeon: Sinclair Ship, MD;  Location: [place];  Service: Orthopedics;  Laterality: N/A;  Anterior cervical decompression fusion, cervical 4-5, cervical 5-6 with instrumentation, allograft.   Family History  Problem Relation Age of Onset  . Hypertension Father     deceased  . Diabetes Father   . Heart disease Father   . Hyperlipidemia Father   . Hypertension Mother   . Hyperlipidemia Mother   . Diabetes Mother   . Hypertension Sister   . Hyperlipidemia Sister   . Hypertension Brother   . Hyperlipidemia Brother    History  Substance Use Topics  . Smoking status: Former Smoker -- 0.25 packs/day for 10 years    Types: Cigarettes    Quit date: 07/07/2013  . Smokeless tobacco: Never Used     Comment: Smokes pk q 3 days  marajuna aug  . Alcohol Use: No     Comment: quit 15yrs ago   OB History   Grav Para Term Preterm Abortions TAB SAB Ect Mult Living                 Review of Systems  Respiratory: Positive for cough.   Musculoskeletal: Positive for neck pain.  All other systems reviewed and are negative.     Allergies  Haldol; Acetaminophen; Ibuprofen; Metronidazole; Morphine and related; and Penicillins  Home Medications   Prior to Admission medications   Medication Sig Start Date End Date Taking? Authorizing Provider  ALPRAZolam Duanne Moron) 1 MG tablet Take 0.5 mg by mouth 2 (two) times daily.    Yes Historical Provider, MD  Cholecalciferol (VITAMIN D PO) Take 1 tablet by mouth once a week. monday   Yes Historical Provider, MD   Cyanocobalamin (VITAMIN B-12 IJ) Inject as directed every 30 (thirty) days. Administered by Dr. York Ram around the 1st of each month   Yes Historical Provider, MD  cyclobenzaprine (FLEXERIL) 5 MG tablet Take 5 mg by mouth 3 (three) times daily as needed for muscle spasms.   Yes Historical Provider, MD  dicyclomine (BENTYL) 20 MG tablet Take 20 mg by mouth 3 (three) times daily with meals.   Yes Historical Provider, MD  gabapentin (NEURONTIN) 400 MG capsule Take 400 mg by mouth 3 (three) times daily. Nerve pain   Yes Historical Provider, MD  HYDROcodone-acetaminophen (NORCO) 10-325 MG per tablet Take 1 tablet by mouth 3 (three) times daily as needed. For pain 07/24/14  Yes Historical Provider, MD  Ipratropium-Albuterol (COMBIVENT RESPIMAT) 20-100 MCG/ACT AERS respimat Inhale 2 puffs into the lungs daily as needed for wheezing or shortness of breath.    Yes Historical Provider, MD  loratadine (CLARITIN) 10 MG tablet Take 10 mg by mouth daily.    Yes Historical Provider, MD  Menthol-Methyl Salicylate (MUSCLE RUB) 10-15 % CREA Apply 1 application topically every other day. Left shoulder pain   Yes Historical Provider, MD  metFORMIN (GLUCOPHAGE) 500 MG tablet Take 500 mg by mouth 2 (two) times daily with a meal.   Yes Historical Provider, MD  metoprolol tartrate (LOPRESSOR) 25 MG tablet Take 25 mg by mouth 2 (two) times daily.  11/24/13  Yes Historical Provider, MD  olmesartan-hydrochlorothiazide (BENICAR HCT) 40-25 MG per tablet Take 1 tablet by mouth daily.   Yes Historical Provider, MD  omeprazole (PRILOSEC) 20 MG capsule Take 20 mg by mouth 2 (two) times daily before a meal.  01/16/14  Yes Historical Provider, MD  OXcarbazepine (TRILEPTAL) 150 MG tablet Take 150 mg by mouth 3 (three) times daily.  11/24/13  Yes Historical Provider, MD  simvastatin (ZOCOR) 20 MG tablet Take 20 mg by mouth at bedtime.    Yes Historical Provider, MD   BP 125/70  Pulse 96  Temp(Src) 98.3 F (36.8 C) (Oral)  Resp 13   SpO2 100% Physical Exam  Nursing note and vitals reviewed. Constitutional: She is oriented to person, place, and time.  Slightly uncomfortable   HENT:  Head: Normocephalic.  No teeth left. No obvious periapical abscess. No obvious PTA. ? Minimal floor of mouth tenderness but no firm. Mild L sinus tenderness   Eyes: Conjunctivae are normal. Pupils are equal, round, and reactive to light.  Neck:  + enlarged L cervical lymph nodes also down along the SCM.   Cardiovascular: Normal rate, regular rhythm and normal heart sounds.   Pulmonary/Chest: Effort normal and breath sounds  normal. No respiratory distress. She has no wheezes. She has no rales.  Abdominal: Soft. Bowel sounds are normal. She exhibits no distension. There is no tenderness. There is no rebound.  Musculoskeletal: Normal range of motion. She exhibits no edema and no tenderness.  Neurological: She is alert and oriented to person, place, and time. No cranial nerve deficit. Coordination normal.  Skin: Skin is warm and dry.  Psychiatric: She has a normal mood and affect. Her behavior is normal. Judgment and thought content normal.    ED Course  Procedures (including critical care time) Labs Review Labs Reviewed  COMPREHENSIVE METABOLIC PANEL - Abnormal; Notable for the following:    Potassium 3.2 (*)    All other components within normal limits  CBC WITH DIFFERENTIAL  I-STAT TROPOININ, ED  Randolm Idol, ED    Imaging Review Ct Soft Tissue Neck Wo Contrast  08/20/2014   CLINICAL DATA:  Fever. Left facial and neck swelling. Possible tooth abscess.  EXAM: CT NECK WITHOUT CONTRAST  CT MAXILLOFACIAL WITHOUT CONTRAST  TECHNIQUE: Multidetector CT imaging of the neck and maxillofacial structures were performed using the standard protocol without intravenous contrast. Multiplanar CT image reconstructions of the maxillofacial and neck structures were also generated.  COMPARISON:  Multiple exams, including 11/21/2012.  FINDINGS: CT  MAXILLOFACIAL FINDINGS  Left frontal craniotomy. Chronic frontal, maxillary, sphenoid, and ethmoid sinusitis. No middle ear fluid or mastoid effusion.  The patient is edentulous. Resulting bony demineralization of the maxilla and mandible observed. No mandibular or maxillary abscess is apparent. No bony destructive findings along the sinuses or evidence of extension of sinusitis outside of the confines of the sinuses observed. No facial fracture identified.  Left frontal lobe encephalomalacia, not changed from prior head CT of 2013. No intraorbital abnormality. Asymmetry of the parotid glands with the left side appearing fairly normal and the right-sided appearing fatty replaced or atrophic. Submandibular glands symmetric.  No significant periorbital soft tissue swelling is identified on the left.  CT NECK FINDINGS  Parapharyngeal spaces normal and symmetric. Small bilateral internal jugular lymph nodes are not pathologically enlarged by size criteria. An index node measuring 6 mm is present in station 2 on the left.  Borderline prominence palatine tonsillar tissue. Lingual and in tonsillar pillars within normal limits and relatively symmetric. No compelling findings of tonsillar abscess. Vallecula and piriform sinuses symmetric. Glottic structures unremarkable. Thyroid unremarkable.  A right upper paratracheal lymph node is within normal limits at 0.7 cm in short axis. No gas is observed tracking in neck.  Anterior plate and screw fixation at C4-C5-C6 with interbody spacers noted. No significant osseous foraminal or central stenosis.  IMPRESSION: 1. Chronic paranasal sinusitis. No bony or soft tissue abscess is well appreciated. The patient is edentulous. 2. Fatty replacement of the right parotid gland, possibly from chronic inflammation or chronic obstruction. The left parotid gland appears normal. 3. Scattered small lymph nodes in the neck are not pathologically enlarged by size criteria. 4. C4-C5-C6 ACDF without  complicating feature. 5. Borderline prominence of palatine tonsillar tissues. These may be reactive given the regional chronic sinusitis. 6. 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   Electronically Signed   By: Sherryl Barters M.D.   On: 08/20/2014 13:11   Ct Maxillofacial Wo Cm  08/20/2014   CLINICAL DATA:  Fever. Left facial and neck swelling. Possible tooth abscess.  EXAM: CT NECK WITHOUT CONTRAST  CT MAXILLOFACIAL WITHOUT CONTRAST  TECHNIQUE: Multidetector CT imaging of the neck and maxillofacial structures were performed using  the standard protocol without intravenous contrast. Multiplanar CT image reconstructions of the maxillofacial and neck structures were also generated.  COMPARISON:  Multiple exams, including 11/21/2012.  FINDINGS: CT MAXILLOFACIAL FINDINGS  Left frontal craniotomy. Chronic frontal, maxillary, sphenoid, and ethmoid sinusitis. No middle ear fluid or mastoid effusion.  The patient is edentulous. Resulting bony demineralization of the maxilla and mandible observed. No mandibular or maxillary abscess is apparent. No bony destructive findings along the sinuses or evidence of extension of sinusitis outside of the confines of the sinuses observed. No facial fracture identified.  Left frontal lobe encephalomalacia, not changed from prior head CT of 2013. No intraorbital abnormality. Asymmetry of the parotid glands with the left side appearing fairly normal and the right-sided appearing fatty replaced or atrophic. Submandibular glands symmetric.  No significant periorbital soft tissue swelling is identified on the left.  CT NECK FINDINGS  Parapharyngeal spaces normal and symmetric. Small bilateral internal jugular lymph nodes are not pathologically enlarged by size criteria. An index node measuring 6 mm is present in station 2 on the left.  Borderline prominence palatine tonsillar tissue. Lingual and in tonsillar pillars within normal limits and relatively symmetric. No  compelling findings of tonsillar abscess. Vallecula and piriform sinuses symmetric. Glottic structures unremarkable. Thyroid unremarkable.  A right upper paratracheal lymph node is within normal limits at 0.7 cm in short axis. No gas is observed tracking in neck.  Anterior plate and screw fixation at C4-C5-C6 with interbody spacers noted. No significant osseous foraminal or central stenosis.  IMPRESSION: 1. Chronic paranasal sinusitis. No bony or soft tissue abscess is well appreciated. The patient is edentulous. 2. Fatty replacement of the right parotid gland, possibly from chronic inflammation or chronic obstruction. The left parotid gland appears normal. 3. Scattered small lymph nodes in the neck are not pathologically enlarged by size criteria. 4. C4-C5-C6 ACDF without complicating feature. 5. Borderline prominence of palatine tonsillar tissues. These may be reactive given the regional chronic sinusitis. 6. 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   Electronically Signed   By: Sherryl Barters M.D.   On: 08/20/2014 13:11     EKG Interpretation   Date/Time:  Thursday August 20 2014 09:33:38 EDT Ventricular Rate:  78 PR Interval:  202 QRS Duration: 77 QT Interval:  385 QTC Calculation: 438 R Axis:   44 Text Interpretation:  Sinus rhythm No significant change since last  tracing Confirmed by YAO  MD, DAVID ([id number]) on 08/20/2014 9:53:16 AM      MDM   Final diagnoses:  None    [name] [name] is a 63 y.o. female here with L jaw and face swelling. I think likely from sinusitis. Consider early ludwig's. Will get CT and labs and reassess.    3:39 PM IV infiltrated during CT so can only get non con CT. CT showed sinusitis. Patient allergic to PCN so started on doxy. Delta trop neg, I doubt ACS. No signs of ludwig's    Wandra Arthurs, MD 08/20/14 1539

## 2014-08-27 ENCOUNTER — Ambulatory Visit: Payer: Medicare HMO | Admitting: Physical Therapy

## 2014-09-03 ENCOUNTER — Ambulatory Visit: Payer: Medicare HMO | Attending: Orthopedic Surgery | Admitting: Physical Therapy

## 2014-09-03 DIAGNOSIS — M545 Low back pain: Secondary | ICD-10-CM | POA: Insufficient documentation

## 2014-09-03 DIAGNOSIS — Z9889 Other specified postprocedural states: Secondary | ICD-10-CM | POA: Diagnosis not present

## 2014-09-03 DIAGNOSIS — M542 Cervicalgia: Secondary | ICD-10-CM | POA: Diagnosis not present

## 2014-09-10 ENCOUNTER — Ambulatory Visit
Admission: RE | Admit: 2014-09-10 | Discharge: 2014-09-10 | Disposition: A | Discharge status: Home / Self Care | Payer: Medicare HMO | Source: Ambulatory Visit | Attending: Cardiothoracic Surgery | Admitting: Cardiothoracic Surgery

## 2014-09-10 ENCOUNTER — Ambulatory Visit (INDEPENDENT_AMBULATORY_CARE_PROVIDER_SITE_OTHER): Payer: Medicare HMO | Admitting: Cardiothoracic Surgery

## 2014-09-10 ENCOUNTER — Encounter: Payer: Self-pay | Admitting: Cardiothoracic Surgery

## 2014-09-10 VITALS — BP 139/86 | HR 84 | Ht 64.0 in | Wt 150.0 lb

## 2014-09-10 DIAGNOSIS — Z902 Acquired absence of lung [part of]: Secondary | ICD-10-CM

## 2014-09-10 DIAGNOSIS — C3490 Malignant neoplasm of unspecified part of unspecified bronchus or lung: Secondary | ICD-10-CM

## 2014-09-10 DIAGNOSIS — Z9889 Other specified postprocedural states: Secondary | ICD-10-CM

## 2014-09-10 NOTE — Progress Notes (Signed)
[address]       [place], [address]             [phone]                                      Jaycey Hinde [facility] Record #[id number] Date of Birth: June 01, 1951  Kathee Delton, MD Harvie Junior, MD  Chief Complaint:   PostOp Follow Up Visit  12/04/2012  OPERATIVE REPORT  PREOPERATIVE DIAGNOSIS: Slowly enlarging right upper lobe lung nodule.  POSTOPERATIVE DIAGNOSIS: Carcinoma of the right upper lobe.  PROCEDURE PERFORMED: Bronchoscopy, right video-assisted thoracoscopy,  mini thoracotomy, wedge resection, right upper lobe and completion right  upper lobectomy with lymph node sampling, placement of On-Q device.   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 Stage I. Unfortunately the patient continues to very episodically smoke. She continues to take care of her grandkids. She notes that she's up-to-date on colonoscopy, to have a mammogram later this year, on November 7 she is seeing her primary care doctor for pneumococcal and flu vaccination.    History  Smoking status  . Former Smoker -- 0.25 packs/day for 10 years  . Types: Cigarettes  . Quit date: 07/07/2013  Smokeless tobacco  . Never Used    Comment: Smokes pk q 3 days   marajuna aug       Allergies  Allergen Reactions  . Haldol [Haloperidol Decanoate] Other (See Comments)    tongue swelling  . Acetaminophen Other (See Comments)    Stomach pain  . Ibuprofen Other (See Comments)    Stomach pain  . Metronidazole Other (See Comments)    Severe stomach upset--was instructed to avoid Flagyl.  . Morphine And Related Hives    hives  . Penicillins Rash    Current Outpatient Prescriptions  Medication Sig Dispense Refill  . Cholecalciferol (VITAMIN D PO) Take 1  tablet by mouth once a week. monday      . Cyanocobalamin (VITAMIN B-12 IJ) Inject as directed every 30 (thirty) days. Administered by Dr. York Ram around the 1st of each month      . dicyclomine (BENTYL) 20 MG tablet Take 20 mg by mouth 3 (three) times daily with meals.      . gabapentin (NEURONTIN) 400 MG capsule Take 400 mg by mouth 3 (three) times daily. Nerve pain      . loratadine (CLARITIN) 10 MG tablet Take 10 mg by mouth daily.       . metFORMIN (GLUCOPHAGE) 500 MG tablet Take 500 mg by mouth 2 (two) times daily with a meal.      . metoprolol tartrate (LOPRESSOR) 25 MG tablet Take 25 mg by mouth 2 (two) times daily.       Marland Kitchen olmesartan-hydrochlorothiazide (BENICAR HCT) 40-25 MG per tablet Take 1 tablet by mouth daily.      Marland Kitchen omeprazole (PRILOSEC) 20 MG capsule Take 20 mg by mouth 2 (two) times daily before a meal.       . OXcarbazepine (TRILEPTAL) 150 MG tablet Take 150 mg by mouth 3 (three) times daily.       . simvastatin (ZOCOR) 20 MG tablet Take 20 mg  by mouth at bedtime.       . ALPRAZolam (XANAX) 1 MG tablet Take 0.5 mg by mouth 2 (two) times daily.       . cyclobenzaprine (FLEXERIL) 5 MG tablet Take 5 mg by mouth 3 (three) times daily as needed for muscle spasms.      Marland Kitchen HYDROcodone-acetaminophen (NORCO) 10-325 MG per tablet Take 1 tablet by mouth 3 (three) times daily as needed. For pain      . Ipratropium-Albuterol (COMBIVENT RESPIMAT) 20-100 MCG/ACT AERS respimat Inhale 2 puffs into the lungs daily as needed for wheezing or shortness of breath.       . Menthol-Methyl Salicylate (MUSCLE RUB) 10-15 % CREA Apply 1 application topically every other day. Left shoulder pain       No current facility-administered medications for this visit.       Physical Exam: BP 139/86  Pulse 84  Ht 5\' 4"  (1.626 m)  Wt 150 lb (68.04 kg)  BMI 25.73 kg/m2  SpO2 97%  General appearance: alert and cooperative Neurologic: intact Heart: regular rate and rhythm, S1, S2 normal, no murmur,  click, rub or gallop Lungs: clear to auscultation bilaterally and normal percussion bilaterally Abdomen: soft, non-tender; bowel sounds normal; no masses,  no organomegaly Extremities: extremities normal, atraumatic, no cyanosis or edema and Homans sign is negative, no sign of DVT Wound: well healed rt chest sites No palpable cervical or axillary nodes   Diagnostic Studies & Laboratory data:         Recent Radiology Findings:  Ct Chest Wo Contrast  09/10/2014   CLINICAL DATA:  63 year old female with history of right upper lobe lung cancer status post right upper lobectomy diagnosed in 2013. Prior history of smoking, quit 6 months ago.  EXAM: CT CHEST WITHOUT CONTRAST  TECHNIQUE: Multidetector CT imaging of the chest was performed following the standard protocol without IV contrast.  COMPARISON:  Old chest CT 07/03/2013.  FINDINGS: Mediastinum: Heart size is mildly enlarged. There is no significant pericardial fluid, thickening or pericardial calcification. No pathologically enlarged mediastinal or hilar lymph nodes. Please note that accurate exclusion of hilar adenopathy is limited on noncontrast CT scans. Esophagus is unremarkable in appearance. Dilated cisterna chyli beneath the crus of the right hemidiaphragm measuring 14 mm, new compared to the prior study.  Lungs/Pleura: Status post right upper lobectomy. No suspicious appearing pulmonary nodules or masses. There is a small amount of scarring in the lateral aspect of the left lower lobe and inferior segment of the lingula. No acute consolidative airspace disease. No pleural effusions. Today's study appears hypoventilatory.  Upper Abdomen: Atherosclerosis.  Otherwise, unremarkable.  Musculoskeletal: There are no aggressive appearing lytic or blastic lesions noted in the visualized portions of the skeleton. Orthopedic fixation hardware in the lower cervical spine incompletely imaged.  IMPRESSION: 1. Status post right upper lobectomy. No findings to  suggest local recurrence of disease or metastatic disease in the thorax. 2. Dilatation of the cisterna chyli, new compared to prior studies. This is of uncertain etiology and significance, but attention on followup studies is recommended to ensure the resolution of this finding.   Electronically Signed   By: Vinnie Langton M.D.   On: 09/10/2014 14:14   Ct Maxillofacial Wo Cm  08/20/2014   CLINICAL DATA:  Fever. Left facial and neck swelling. Possible tooth abscess.  EXAM: CT NECK WITHOUT CONTRAST  CT MAXILLOFACIAL WITHOUT CONTRAST  TECHNIQUE: Multidetector CT imaging of the neck and maxillofacial structures were performed using the standard protocol  without intravenous contrast. Multiplanar CT image reconstructions of the maxillofacial and neck structures were also generated.  COMPARISON:  Multiple exams, including 11/21/2012.  FINDINGS: CT MAXILLOFACIAL FINDINGS  Left frontal craniotomy. Chronic frontal, maxillary, sphenoid, and ethmoid sinusitis. No middle ear fluid or mastoid effusion.  The patient is edentulous. Resulting bony demineralization of the maxilla and mandible observed. No mandibular or maxillary abscess is apparent. No bony destructive findings along the sinuses or evidence of extension of sinusitis outside of the confines of the sinuses observed. No facial fracture identified.  Left frontal lobe encephalomalacia, not changed from prior head CT of 2013. No intraorbital abnormality. Asymmetry of the parotid glands with the left side appearing fairly normal and the right-sided appearing fatty replaced or atrophic. Submandibular glands symmetric.  No significant periorbital soft tissue swelling is identified on the left.  CT NECK FINDINGS  Parapharyngeal spaces normal and symmetric. Small bilateral internal jugular lymph nodes are not pathologically enlarged by size criteria. An index node measuring 6 mm is present in station 2 on the left.  Borderline prominence palatine tonsillar tissue. Lingual  and in tonsillar pillars within normal limits and relatively symmetric. No compelling findings of tonsillar abscess. Vallecula and piriform sinuses symmetric. Glottic structures unremarkable. Thyroid unremarkable.  A right upper paratracheal lymph node is within normal limits at 0.7 cm in short axis. No gas is observed tracking in neck.  Anterior plate and screw fixation at C4-C5-C6 with interbody spacers noted. No significant osseous foraminal or central stenosis.  IMPRESSION: 1. Chronic paranasal sinusitis. No bony or soft tissue abscess is well appreciated. The patient is edentulous. 2. Fatty replacement of the right parotid gland, possibly from chronic inflammation or chronic obstruction. The left parotid gland appears normal. 3. Scattered small lymph nodes in the neck are not pathologically enlarged by size criteria. 4. C4-C5-C6 ACDF without complicating feature. 5. Borderline prominence of palatine tonsillar tissues. These may be reactive given the regional chronic sinusitis. 6. 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   Electronically Signed   By: Sherryl Barters M.D.   On: 08/20/2014 13:11  Ct Chest Wo Contrast  07/03/2013   *RADIOLOGY REPORT*  Clinical Data: History of right upper lobectomy for resection of right upper lobe carcinoma in January 2014, now with some chest wall pain.  Former smoking history.  CT CHEST WITHOUT CONTRAST  Technique:  Multidetector CT imaging of the chest was performed following the standard protocol without IV contrast.  Comparison: CT chest of 10/08/2012 and chest x-ray of 05/21/2013  Findings: On the lung window images, postoperative changes from resection of the right upper lobe lesion are noted.  However there is no evidence of recurrence of lung carcinoma.  Mild centrilobular emphysematous change is again noted.  The central bronchi are patent.  No pleural effusion is seen.  On soft tissue window images, the thyroid gland is unremarkable. On this  unenhanced study, no mediastinal or hilar adenopathy is seen.  Cardiomegaly is stable.  The portion of the upper abdomen that is visible on this unenhanced study is unremarkable.  No axillary adenopathy is seen.  IMPRESSION: No evidence of recurrence of lung carcinoma.   Original Report Authenticated By: Ivar Drape, M.D.      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, pT1a,pN0, Mx  Recent Labs: Lab Results  Component Value Date   WBC 6.0 08/20/2014   HGB 14.4 08/20/2014   HCT 43.8 08/20/2014   PLT 249 08/20/2014   GLUCOSE 95 08/20/2014  ALT 14 08/20/2014   AST 19 08/20/2014   NA 141 08/20/2014   K 3.2* 08/20/2014   CL 99 08/20/2014   CREATININE 0.58 08/20/2014   BUN 13 08/20/2014   CO2 29 08/20/2014   TSH 2.492 01/02/2013   INR 0.98 02/26/2014   HGBA1C 6.1* 03/08/2013      Assessment / Plan:   Patient's approximately 20 months status post right upper lobectomy for stage I well-differentiated mucin producing adenocarcinoma Ct scan today shows no evidence of recurrance She was again encouraged to stop smoking and offered assistance with the [facility] smoking cessation program, she was encouraged to follow through with her flu vaccination I plan to see her back in 6 months with a followup CT scan of the chest, they'll be just over 2 years postoperatively.   Khaya Theissen B 09/10/2014 3:16 PM

## 2014-11-27 ENCOUNTER — Emergency Department (HOSPITAL_COMMUNITY): Payer: Medicare HMO

## 2014-11-27 ENCOUNTER — Emergency Department (HOSPITAL_COMMUNITY)
Admission: EM | Admit: 2014-11-27 | Discharge: 2014-11-27 | Disposition: A | Discharge status: Home / Self Care | Payer: Medicare HMO | Attending: Emergency Medicine | Admitting: Emergency Medicine

## 2014-11-27 ENCOUNTER — Encounter (HOSPITAL_COMMUNITY): Payer: Self-pay

## 2014-11-27 DIAGNOSIS — Z880 Allergy status to penicillin: Secondary | ICD-10-CM | POA: Insufficient documentation

## 2014-11-27 DIAGNOSIS — R110 Nausea: Secondary | ICD-10-CM | POA: Insufficient documentation

## 2014-11-27 DIAGNOSIS — Z87891 Personal history of nicotine dependence: Secondary | ICD-10-CM | POA: Diagnosis not present

## 2014-11-27 DIAGNOSIS — R011 Cardiac murmur, unspecified: Secondary | ICD-10-CM | POA: Diagnosis not present

## 2014-11-27 DIAGNOSIS — F419 Anxiety disorder, unspecified: Secondary | ICD-10-CM | POA: Diagnosis not present

## 2014-11-27 DIAGNOSIS — M1990 Unspecified osteoarthritis, unspecified site: Secondary | ICD-10-CM | POA: Diagnosis not present

## 2014-11-27 DIAGNOSIS — J449 Chronic obstructive pulmonary disease, unspecified: Secondary | ICD-10-CM | POA: Diagnosis not present

## 2014-11-27 DIAGNOSIS — Z85118 Personal history of other malignant neoplasm of bronchus and lung: Secondary | ICD-10-CM | POA: Diagnosis not present

## 2014-11-27 DIAGNOSIS — F319 Bipolar disorder, unspecified: Secondary | ICD-10-CM | POA: Insufficient documentation

## 2014-11-27 DIAGNOSIS — K219 Gastro-esophageal reflux disease without esophagitis: Secondary | ICD-10-CM | POA: Diagnosis not present

## 2014-11-27 DIAGNOSIS — Z9851 Tubal ligation status: Secondary | ICD-10-CM | POA: Diagnosis not present

## 2014-11-27 DIAGNOSIS — R109 Unspecified abdominal pain: Secondary | ICD-10-CM | POA: Diagnosis present

## 2014-11-27 DIAGNOSIS — G40909 Epilepsy, unspecified, not intractable, without status epilepticus: Secondary | ICD-10-CM | POA: Insufficient documentation

## 2014-11-27 DIAGNOSIS — Z79899 Other long term (current) drug therapy: Secondary | ICD-10-CM | POA: Insufficient documentation

## 2014-11-27 DIAGNOSIS — R1084 Generalized abdominal pain: Secondary | ICD-10-CM | POA: Diagnosis not present

## 2014-11-27 DIAGNOSIS — R6883 Chills (without fever): Secondary | ICD-10-CM | POA: Insufficient documentation

## 2014-11-27 DIAGNOSIS — G8929 Other chronic pain: Secondary | ICD-10-CM | POA: Diagnosis not present

## 2014-11-27 DIAGNOSIS — I10 Essential (primary) hypertension: Secondary | ICD-10-CM | POA: Diagnosis not present

## 2014-11-27 DIAGNOSIS — Z8619 Personal history of other infectious and parasitic diseases: Secondary | ICD-10-CM | POA: Diagnosis not present

## 2014-11-27 LAB — COMPREHENSIVE METABOLIC PANEL
ALBUMIN: 3.9 g/dL (ref 3.5–5.2)
ALK PHOS: 91 U/L (ref 39–117)
ALT: 13 U/L (ref 0–35)
AST: 21 U/L (ref 0–37)
Anion gap: 5 (ref 5–15)
BUN: 8 mg/dL (ref 6–23)
CALCIUM: 9.2 mg/dL (ref 8.4–10.5)
CO2: 29 mmol/L (ref 19–32)
Chloride: 106 mEq/L (ref 96–112)
Creatinine, Ser: 0.63 mg/dL (ref 0.50–1.10)
GFR calc Af Amer: >90 mL/min (ref >90)
GFR calc non Af Amer: >90 mL/min (ref >90)
GLUCOSE: 93 mg/dL (ref 70–99)
Potassium: 3.7 mmol/L (ref 3.5–5.1)
Sodium: 140 mmol/L (ref 135–145)
Total Bilirubin: 0.6 mg/dL (ref 0.3–1.2)
Total Protein: 6.7 g/dL (ref 6.0–8.3)

## 2014-11-27 LAB — CBC WITH DIFFERENTIAL/PLATELET
Basophils Absolute: 0.1 10*3/uL (ref 0.0–0.1)
Basophils Relative: 1 % (ref 0–1)
EOS PCT: 3 % (ref 0–5)
Eosinophils Absolute: 0.2 10*3/uL (ref 0.0–0.7)
HCT: 39.9 % (ref 36.0–46.0)
Hemoglobin: 12.8 g/dL (ref 12.0–15.0)
LYMPHS PCT: 43 % (ref 12–46)
Lymphs Abs: 2.3 10*3/uL (ref 0.7–4.0)
MCH: 28.9 pg (ref 26.0–34.0)
MCHC: 32.1 g/dL (ref 30.0–36.0)
MCV: 90.1 fL (ref 78.0–100.0)
MONOS PCT: 7 % (ref 3–12)
Monocytes Absolute: 0.4 10*3/uL (ref 0.1–1.0)
NEUTROS PCT: 46 % (ref 43–77)
Neutro Abs: 2.5 10*3/uL (ref 1.7–7.7)
PLATELETS: 273 10*3/uL (ref 150–400)
RBC: 4.43 MIL/uL (ref 3.87–5.11)
RDW: 13.2 % (ref 11.5–15.5)
WBC: 5.4 10*3/uL (ref 4.0–10.5)

## 2014-11-27 LAB — URINALYSIS, ROUTINE W REFLEX MICROSCOPIC
BILIRUBIN URINE: NEGATIVE
Glucose, UA: NEGATIVE mg/dL
Hgb urine dipstick: NEGATIVE
KETONES UR: NEGATIVE mg/dL
LEUKOCYTES UA: NEGATIVE
NITRITE: NEGATIVE
PROTEIN: NEGATIVE mg/dL
Specific Gravity, Urine: 1.009 (ref 1.005–1.030)
Urobilinogen, UA: 0.2 mg/dL (ref 0.0–1.0)
pH: 8 (ref 5.0–8.0)

## 2014-11-27 MED ORDER — HYDROCODONE-ACETAMINOPHEN 5-325 MG PO TABS: 1.0000 | ORAL_TABLET | Freq: Four times a day (QID) | ORAL | Status: DC | PRN

## 2014-11-27 MED ORDER — ONDANSETRON HCL 4 MG PO TABS
4.0000 mg | ORAL_TABLET | Freq: Once | ORAL | Status: AC
  Administered 2014-11-27: 4 mg via ORAL
  Filled 2014-11-27: qty 1

## 2014-11-27 MED ORDER — SODIUM CHLORIDE 0.9 % IV BOLUS (SEPSIS): 500.0000 mL | Freq: Once | INTRAVENOUS | Status: DC

## 2014-11-27 MED ORDER — OXYCODONE HCL 5 MG PO TABS
5.0000 mg | ORAL_TABLET | Freq: Once | ORAL | Status: AC
  Administered 2014-11-27: 5 mg via ORAL
  Filled 2014-11-27: qty 1

## 2014-11-27 MED ORDER — ONDANSETRON HCL 4 MG/2ML IJ SOLN: 4.0000 mg | Freq: Once | INTRAMUSCULAR | Status: DC

## 2014-11-27 MED ORDER — SODIUM CHLORIDE 0.9 % IV SOLN: 1000.0000 mL | Freq: Once | INTRAVENOUS | Status: DC

## 2014-11-27 MED ORDER — FENTANYL CITRATE 0.05 MG/ML IJ SOLN
50.0000 ug | Freq: Once | INTRAMUSCULAR | Status: DC
Start: 2014-11-27 — End: 2014-11-27

## 2014-11-27 MED ORDER — FENTANYL CITRATE 0.05 MG/ML IJ SOLN
50.0000 ug | Freq: Once | INTRAMUSCULAR | Status: AC
  Administered 2014-11-27: 50 ug via NASAL
  Filled 2014-11-27: qty 2

## 2014-11-27 NOTE — Discharge Instructions (Signed)
Please follow up with your pcp. He will need to order a HIDA scan as you have a dilated common bile duct and gallbladder.  Abdominal (belly) pain can be caused by many things. Your caregiver performed an examination and possibly ordered blood/urine tests and imaging (CT scan, x-rays, ultrasound). Many cases can be observed and treated at home after initial evaluation in the emergency department. Even though you are being discharged home, abdominal pain can be unpredictable. Therefore, you need a repeated exam if your pain does not resolve, returns, or worsens. Most patients with abdominal pain don't have to be admitted to the hospital or have surgery, but serious problems like appendicitis and gallbladder attacks can start out as nonspecific pain. Many abdominal conditions cannot be diagnosed in one visit, so follow-up evaluations are very important. SEEK IMMEDIATE MEDICAL ATTENTION IF: The pain does not go away or becomes severe.  A temperature above 101 develops.  Repeated vomiting occurs (multiple episodes).  The pain becomes localized to portions of the abdomen. The right side could possibly be appendicitis. In an adult, the left lower portion of the abdomen could be colitis or diverticulitis.  Blood is being passed in stools or vomit (bright red or black tarry stools).  Return also if you develop chest pain, difficulty breathing, dizziness or fainting, or become confused, poorly responsive, or inconsolable (young children).  Abdominal Pain Many things can cause abdominal pain. Usually, abdominal pain is not caused by a disease and will improve without treatment. It can often be observed and treated at home. Your health care provider will do a physical exam and possibly order blood tests and X-rays to help determine the seriousness of your pain. However, in many cases, more time must pass before a clear cause of the pain can be found. Before that point, your health care provider may not know if you  need more testing or further treatment. HOME CARE INSTRUCTIONS  Monitor your abdominal pain for any changes. The following actions may help to alleviate any discomfort you are experiencing:  Only take over-the-counter or prescription medicines as directed by your health care provider.  Do not take laxatives unless directed to do so by your health care provider.  Try a clear liquid diet (broth, tea, or water) as directed by your health care provider. Slowly move to a bland diet as tolerated. SEEK MEDICAL CARE IF:  You have unexplained abdominal pain.  You have abdominal pain associated with nausea or diarrhea.  You have pain when you urinate or have a bowel movement.  You experience abdominal pain that wakes you in the night.  You have abdominal pain that is worsened or improved by eating food.  You have abdominal pain that is worsened with eating fatty foods.  You have a fever. SEEK IMMEDIATE MEDICAL CARE IF:   Your pain does not go away within 2 hours.  You keep throwing up (vomiting).  Your pain is felt only in portions of the abdomen, such as the right side or the left lower portion of the abdomen.  You pass bloody or black tarry stools. MAKE SURE YOU:  Understand these instructions.   Will watch your condition.   Will get help right away if you are not doing well or get worse.  Document Released: 08/16/2005 Document Revised: 11/11/2013 Document Reviewed: 07/16/2013 [facility] Patient Information 2015 [place], [place]. This information is not intended to replace advice given to you by your health care provider. Make sure you discuss any questions you have with  your health care provider.

## 2014-11-27 NOTE — ED Notes (Addendum)
Urine sitting on bedside table. Sent to lab. Phlebotomy at bedside for labs.

## 2014-11-27 NOTE — ED Notes (Signed)
IV team unsuccessful with attempts. Informed PA.

## 2014-11-27 NOTE — ED Notes (Signed)
Pt requesting jello-- service response notified, jello ordered.

## 2014-11-27 NOTE — ED Notes (Signed)
Day 3 of lower abdominal pain. RUQ abdominal pain also. Pt describes urinary symptoms. Frequency, urgency, dysuria.

## 2014-11-27 NOTE — ED Provider Notes (Signed)
CSN: [id number]     Arrival date & time 11/27/14  0759 History   First MD Initiated Contact with Patient 11/27/14 [phone]     Chief Complaint  Patient presents with  . Abdominal Pain     (Consider location/radiation/quality/duration/timing/severity/associated sxs/prior Treatment) HPI   This is a 64 year old female who presents emergency Department with chief complaint of abdominal pain. Patient states that she has lower abdominal pain which has been ongoing for the past 3 days. It has been progressively worsening. She has associated urgency and frequency of urination along with some dysuria. She denies hematuria or foul odor of urine. Patient denies constipation. She does have associated nausea and has been having hot and cold chills. Patient also complains of pain in the right upper quadrant. Patient points to the area of the Murphy's point and states she has exquisite pain at that area. Review of her chart shows a previous CT scan of the abdomen on 05/27/2013 that showed fatty liver infiltration's as well as common bile duct dilatation to 11 mm which was of unknown etiology at that time. Patient complains of associated nausea without vomiting. She has been taking her pain medication and dicyclomine at home without relief.  Past Medical History  Diagnosis Date  . Hypertension   . Diabetes mellitus   . Bipolar 1 disorder   . Heart murmur   . Sciatica   . Arthritis   . Anxiety   . Diverticulosis   . Chronic low back pain   . DDD (degenerative disc disease), lumbar   . Chronic shoulder pain   . Chronic neck pain   . Hepatitis B     Unclear when initially diagnosed, labs in Epic from 03/09/13  . Hepatitis C     Unclear when initially diagnosed, labs in Epic from 03/09/13  . C. difficile diarrhea     April and February 2014  . Adenocarcinoma of lung, stage 1   . COPD (chronic obstructive pulmonary disease)   . GERD (gastroesophageal reflux disease)   . H/O hiatal hernia   .  Headache(784.0)     hx  . PONV (postoperative nausea and vomiting)   . Seizures     on meds   Past Surgical History  Procedure Laterality Date  . Facial tumor removal Right 2000  . Brain tumor removal  09  . Brain surgery      in [place]  . Video bronchoscopy  12/04/2012    Procedure: VIDEO BRONCHOSCOPY;  Surgeon: Grace Isaac, MD;  [facility]: [facility];  Service: Thoracic;  Laterality: N/A;  . Video assisted thoracoscopy (vats)/wedge resection  12/04/2012    Procedure: VIDEO ASSISTED THORACOSCOPY (VATS)/WEDGE RESECTION;  Surgeon: Grace Isaac, MD;  Location: [place];  Service: Thoracic;  Laterality: Right;  . Lobectomy  12/04/2012    Procedure: LOBECTOMY;  Surgeon: Grace Isaac, MD;  Location: [place];  Service: Thoracic;  Laterality: Right;  completion of right upper lobectomy and lymph node disection, placement of on q pump  . Tubal ligation    . Tonsillectomy    . Anterior cervical decomp/discectomy fusion N/A 03/05/2014    Procedure: ANTERIOR CERVICAL DECOMPRESSION/DISCECTOMY FUSION 2 LEVELS;  Surgeon: Sinclair Ship, MD;  Location: [place];  Service: Orthopedics;  Laterality: N/A;  Anterior cervical decompression fusion, cervical 4-5, cervical 5-6 with instrumentation, allograft.   Family History  Problem Relation Age of Onset  . Hypertension Father     deceased  . Diabetes Father   .  Heart disease Father   . Hyperlipidemia Father   . Hypertension Mother   . Hyperlipidemia Mother   . Diabetes Mother   . Hypertension Sister   . Hyperlipidemia Sister   . Hypertension Brother   . Hyperlipidemia Brother    History  Substance Use Topics  . Smoking status: Former Smoker -- 0.25 packs/day for 10 years    Types: Cigarettes    Quit date: 07/07/2013  . Smokeless tobacco: Never Used     Comment: Smokes pk q 3 days   marajuna aug  . Alcohol Use: No     Comment: quit 57yrs ago   OB History    No data available     Review of Systems  Ten systems reviewed and  are negative for acute change, except as noted in the HPI.    Allergies  Haldol; Acetaminophen; Ibuprofen; Metronidazole; Morphine and related; and Penicillins  Home Medications   Prior to Admission medications   Medication Sig Start Date End Date Taking? Authorizing Provider  ALPRAZolam Duanne Moron) 1 MG tablet Take 0.5 mg by mouth 2 (two) times daily.    Yes Historical Provider, MD  Cyanocobalamin (VITAMIN B-12 IJ) Inject as directed every 30 (thirty) days. Administered by Dr. York Ram around the 1st of each month   Yes Historical Provider, MD  dicyclomine (BENTYL) 20 MG tablet Take 20 mg by mouth 3 (three) times daily with meals.   Yes Historical Provider, MD  gabapentin (NEURONTIN) 400 MG capsule Take 400 mg by mouth 3 (three) times daily. Nerve pain   Yes Historical Provider, MD  Ipratropium-Albuterol (COMBIVENT RESPIMAT) 20-100 MCG/ACT AERS respimat Inhale 2 puffs into the lungs daily as needed for wheezing or shortness of breath.    Yes Historical Provider, MD  loratadine (CLARITIN) 10 MG tablet Take 10 mg by mouth daily.    Yes Historical Provider, MD  Menthol-Methyl Salicylate (MUSCLE RUB) 10-15 % CREA Apply 1 application topically every other day. Left shoulder pain   Yes Historical Provider, MD  metFORMIN (GLUCOPHAGE) 500 MG tablet Take 500 mg by mouth 2 (two) times daily with a meal.   Yes Historical Provider, MD  metoprolol tartrate (LOPRESSOR) 25 MG tablet Take 25 mg by mouth 2 (two) times daily.  11/24/13  Yes Historical Provider, MD  olmesartan-hydrochlorothiazide (BENICAR HCT) 40-25 MG per tablet Take 1 tablet by mouth daily.   Yes Historical Provider, MD  omeprazole (PRILOSEC) 20 MG capsule Take 20 mg by mouth 2 (two) times daily before a meal.  01/16/14  Yes Historical Provider, MD  OXcarbazepine (TRILEPTAL) 150 MG tablet Take 150 mg by mouth 3 (three) times daily.  11/24/13  Yes Historical Provider, MD  simvastatin (ZOCOR) 20 MG tablet Take 20 mg by mouth at bedtime.    Yes  Historical Provider, MD  tiZANidine (ZANAFLEX) 4 MG tablet Take 4 mg by mouth every 8 (eight) hours as needed for muscle spasms.   Yes Historical Provider, MD  Cholecalciferol (VITAMIN D PO) Take 1 tablet by mouth once a week. monday    Historical Provider, MD  cyclobenzaprine (FLEXERIL) 5 MG tablet Take 5 mg by mouth 3 (three) times daily as needed for muscle spasms.    Historical Provider, MD  HYDROcodone-acetaminophen (NORCO) 5-325 MG per tablet Take 1-2 tablets by mouth every 6 (six) hours as needed for moderate pain. 11/27/14   Prather Failla, PA-C   BP 150/69 mmHg  Pulse 67  Temp(Src) 99.5 F (37.5 C) (Oral)  Resp 18  Ht 5'  4" (1.626 m)  Wt 147 lb (66.679 kg)  BMI 25.22 kg/m2  SpO2 99% Physical Exam Physical Exam  Nursing note and vitals reviewed. Constitutional: She is oriented to person, place, and time. She appears well-developed and well-nourished. She appears older than stated age. HENT:  Head: Normocephalic and atraumatic.  Eyes: Conjunctivae normal and EOM are normal. Pupils are equal, round, and reactive to light. No scleral icterus.  Neck: Normal range of motion.  Cardiovascular: Normal rate, regular rhythm and normal heart sounds.  Exam reveals no gallop and no friction rub.   No murmur heard. Pulmonary/Chest: Effort normal and breath sounds normal. No respiratory distress.  Abdominal: Soft. Bowel sounds are normal. Lower quadrant and suprapubic abdominal tenderness. Patient guards against palpation in the right upper quadrant and unable to perform tests for a Murphy's sign. No CVA tenderness Neurological: She is alert and oriented to person, place, and time.  Skin: Skin is warm and dry. She is not diaphoretic.    ED Course  Procedures (including critical care time) Labs Review Labs Reviewed  CBC WITH DIFFERENTIAL  URINALYSIS, ROUTINE W REFLEX MICROSCOPIC  COMPREHENSIVE METABOLIC PANEL    Imaging Review US Abdomen Complete  11/27/2014   CLINICAL DATA:  Abdominal  pain, diabetes, hepatitis B and C. Ate at 7:30 a.m.  EXAM: COMPLETE ABDOMINAL ULTRASOUND  COMPARISON:  CT 02/07/2014  FINDINGS: Gallbladder: Incompletely distended without stones, wall thickening, or pericholecystic fluid. Sonographer reports no sonographic Murphy's sign.  Common bile duct:  Normal in caliber, 39mm diameter.  Liver: Homogeneous in echotexture without focal lesion or intrahepatic bile duct dilatation.  IVC:  Negative  Pancreas:  Negative  Spleen:  No focal lesion, craniocaudal 3.9cm in length.  Right Kidney:  No mass or hydronephrosis, 10.2cm in length.  Left Kidney:  No lesion or hydronephrosis, 10.5cm in length.  Abdominal aorta:  Negative  IMPRESSION: No acute abnormality or focal lesion . Incompletely distended gallbladder.   Electronically Signed   By: Arne Cleveland M.D.   On: 11/27/2014 11:12     EKG Interpretation None      MDM   Final diagnoses:  Generalized abdominal pain    Patient here with symptoms concerning for both urinary tract infection. She also has right upper quadrant pain with a known ductal dilatation. Question if there is any issue with her gallbladder.   Patient with negative LFT. No acute lab abnormalities.  Multiple attempts at IV access without success. The patient's pain is better controlled and he is tolerating po fluids at this time. I have spoken with GI who recommends HIDA scan and a consult with surgery. I have spoken with Surgery who also recommends HIDA scan, however the patient cannot get it emergently as sh has had pain medications today and this is a contraindication. The patient will be discharged to follow up with her PCP with recommendation to get a hida scan  Margarita Mail, PA-C 11/27/14 [place], MD 11/29/14 [phone]

## 2014-11-27 NOTE — ED Notes (Signed)
Patient transported to Ultrasound 

## 2014-12-05 ENCOUNTER — Encounter (HOSPITAL_COMMUNITY): Payer: Self-pay | Admitting: Cardiology

## 2014-12-05 ENCOUNTER — Emergency Department (HOSPITAL_COMMUNITY): Payer: Medicare HMO

## 2014-12-05 ENCOUNTER — Emergency Department (HOSPITAL_COMMUNITY)
Admission: EM | Admit: 2014-12-05 | Discharge: 2014-12-05 | Disposition: A | Discharge status: Home / Self Care | Payer: Medicare HMO | Attending: Emergency Medicine | Admitting: Emergency Medicine

## 2014-12-05 DIAGNOSIS — I10 Essential (primary) hypertension: Secondary | ICD-10-CM | POA: Diagnosis not present

## 2014-12-05 DIAGNOSIS — R1084 Generalized abdominal pain: Secondary | ICD-10-CM | POA: Insufficient documentation

## 2014-12-05 DIAGNOSIS — Z880 Allergy status to penicillin: Secondary | ICD-10-CM | POA: Insufficient documentation

## 2014-12-05 DIAGNOSIS — F419 Anxiety disorder, unspecified: Secondary | ICD-10-CM | POA: Diagnosis not present

## 2014-12-05 DIAGNOSIS — K219 Gastro-esophageal reflux disease without esophagitis: Secondary | ICD-10-CM | POA: Diagnosis not present

## 2014-12-05 DIAGNOSIS — R109 Unspecified abdominal pain: Secondary | ICD-10-CM | POA: Diagnosis present

## 2014-12-05 DIAGNOSIS — M1990 Unspecified osteoarthritis, unspecified site: Secondary | ICD-10-CM | POA: Diagnosis not present

## 2014-12-05 DIAGNOSIS — Z87891 Personal history of nicotine dependence: Secondary | ICD-10-CM | POA: Insufficient documentation

## 2014-12-05 DIAGNOSIS — E119 Type 2 diabetes mellitus without complications: Secondary | ICD-10-CM | POA: Diagnosis not present

## 2014-12-05 DIAGNOSIS — R011 Cardiac murmur, unspecified: Secondary | ICD-10-CM | POA: Diagnosis not present

## 2014-12-05 DIAGNOSIS — Z85118 Personal history of other malignant neoplasm of bronchus and lung: Secondary | ICD-10-CM | POA: Insufficient documentation

## 2014-12-05 DIAGNOSIS — J449 Chronic obstructive pulmonary disease, unspecified: Secondary | ICD-10-CM | POA: Diagnosis not present

## 2014-12-05 DIAGNOSIS — G40909 Epilepsy, unspecified, not intractable, without status epilepticus: Secondary | ICD-10-CM | POA: Diagnosis not present

## 2014-12-05 DIAGNOSIS — Z8619 Personal history of other infectious and parasitic diseases: Secondary | ICD-10-CM | POA: Diagnosis not present

## 2014-12-05 DIAGNOSIS — F319 Bipolar disorder, unspecified: Secondary | ICD-10-CM | POA: Insufficient documentation

## 2014-12-05 DIAGNOSIS — G8929 Other chronic pain: Secondary | ICD-10-CM | POA: Diagnosis not present

## 2014-12-05 DIAGNOSIS — Z79899 Other long term (current) drug therapy: Secondary | ICD-10-CM | POA: Insufficient documentation

## 2014-12-05 LAB — CBC WITH DIFFERENTIAL/PLATELET
Basophils Absolute: 0.1 K/uL (ref 0.0–0.1)
Basophils Relative: 1 % (ref 0–1)
Eosinophils Absolute: 0.1 K/uL (ref 0.0–0.7)
Eosinophils Relative: 2 % (ref 0–5)
HCT: 43.2 % (ref 36.0–46.0)
Hemoglobin: 14.3 g/dL (ref 12.0–15.0)
Lymphocytes Relative: 42 % (ref 12–46)
Lymphs Abs: 1.9 K/uL (ref 0.7–4.0)
MCH: 29.6 pg (ref 26.0–34.0)
MCHC: 33.1 g/dL (ref 30.0–36.0)
MCV: 89.4 fL (ref 78.0–100.0)
Monocytes Absolute: 0.4 K/uL (ref 0.1–1.0)
Monocytes Relative: 9 % (ref 3–12)
Neutro Abs: 2.1 K/uL (ref 1.7–7.7)
Neutrophils Relative %: 46 % (ref 43–77)
Platelets: 225 K/uL (ref 150–400)
RBC: 4.83 MIL/uL (ref 3.87–5.11)
RDW: 12.6 % (ref 11.5–15.5)
WBC: 4.5 K/uL (ref 4.0–10.5)

## 2014-12-05 LAB — URINALYSIS, ROUTINE W REFLEX MICROSCOPIC
Bilirubin Urine: NEGATIVE
Glucose, UA: NEGATIVE mg/dL
Hgb urine dipstick: NEGATIVE
Ketones, ur: 15 mg/dL — AB
Nitrite: NEGATIVE
Protein, ur: 30 mg/dL — AB
Specific Gravity, Urine: 1.035 — ABNORMAL HIGH (ref 1.005–1.030)
Urobilinogen, UA: 1 mg/dL (ref 0.0–1.0)
pH: 6.5 (ref 5.0–8.0)

## 2014-12-05 LAB — COMPREHENSIVE METABOLIC PANEL WITH GFR
ALT: 13 U/L (ref 0–35)
AST: 26 U/L (ref 0–37)
Albumin: 4.1 g/dL (ref 3.5–5.2)
Alkaline Phosphatase: 88 U/L (ref 39–117)
Anion gap: 12 (ref 5–15)
BUN: 14 mg/dL (ref 6–23)
CO2: 26 mmol/L (ref 19–32)
Calcium: 9.8 mg/dL (ref 8.4–10.5)
Chloride: 101 meq/L (ref 96–112)
Creatinine, Ser: 0.77 mg/dL (ref 0.50–1.10)
GFR calc Af Amer: >90 mL/min
GFR calc non Af Amer: 86 mL/min — ABNORMAL LOW
Glucose, Bld: 89 mg/dL (ref 70–99)
Potassium: 3.9 mmol/L (ref 3.5–5.1)
Sodium: 139 mmol/L (ref 135–145)
Total Bilirubin: 0.7 mg/dL (ref 0.3–1.2)
Total Protein: 7.1 g/dL (ref 6.0–8.3)

## 2014-12-05 LAB — LIPASE, BLOOD: Lipase: 24 U/L (ref 11–59)

## 2014-12-05 LAB — URINE MICROSCOPIC-ADD ON

## 2014-12-05 MED ORDER — PROMETHAZINE HCL 25 MG PO TABS: 25.0000 mg | ORAL_TABLET | Freq: Three times a day (TID) | ORAL | Status: DC | PRN

## 2014-12-05 MED ORDER — HYDROMORPHONE HCL 1 MG/ML IJ SOLN
1.0000 mg | Freq: Once | INTRAMUSCULAR | Status: AC
  Administered 2014-12-05: 1 mg via INTRAMUSCULAR
  Filled 2014-12-05: qty 1

## 2014-12-05 MED ORDER — IOHEXOL 300 MG/ML  SOLN
100.0000 mL | Freq: Once | INTRAMUSCULAR | Status: AC | PRN
  Administered 2014-12-05: 100 mL via INTRAVENOUS

## 2014-12-05 MED ORDER — OXYCODONE HCL 5 MG PO TABS: 5.0000 mg | ORAL_TABLET | ORAL | Status: DC | PRN

## 2014-12-05 MED ORDER — FENTANYL CITRATE 0.05 MG/ML IJ SOLN
100.0000 ug | Freq: Once | INTRAMUSCULAR | Status: AC
  Administered 2014-12-05: 100 ug via INTRAVENOUS
  Filled 2014-12-05: qty 2

## 2014-12-05 NOTE — ED Notes (Signed)
Pt. Reports "I don't know how I'm going to take care of myself. I don't have a way to get prescriptions or follow-up. They didn't even give me an IV. I need help". Talked with patient and offered case management/social work services, patient denied stating "that would just be a waste of time".

## 2014-12-05 NOTE — ED Provider Notes (Signed)
CSN: [id number]     Arrival date & time 12/05/14  5643 History   First MD Initiated Contact with Patient 12/05/14 1026     Chief Complaint  Patient presents with  . Abdominal Pain     (Consider location/radiation/quality/duration/timing/severity/associated sxs/prior Treatment) HPI Patient presents to the emergency department with continued abdominal pain that has been ongoing for the past few weeks.  The patient states that she was here last Friday and had an ultrasound done of her gallbladder that showed no significant disease but her gallbladder was decompressed.  The patient states that she has been unable to tolerate anything other than things such as rice bananas Jell-O and clear liquids.  The patient states that she has not had any vomiting, but reports some mild nausea.  The patient was due to have a HIDA scan this morning but came to the emergency department instead.  The patient denies chest pain, shortness of breath, headache, blurred vision, fever, cough, diarrhea, rash, or syncope.  The patient states nothing seems make her condition better or worse Past Medical History  Diagnosis Date  . Hypertension   . Diabetes mellitus   . Bipolar 1 disorder   . Heart murmur   . Sciatica   . Arthritis   . Anxiety   . Diverticulosis   . Chronic low back pain   . DDD (degenerative disc disease), lumbar   . Chronic shoulder pain   . Chronic neck pain   . Hepatitis B     Unclear when initially diagnosed, labs in Epic from 03/09/13  . Hepatitis C     Unclear when initially diagnosed, labs in Epic from 03/09/13  . C. difficile diarrhea     April and February 2014  . Adenocarcinoma of lung, stage 1   . COPD (chronic obstructive pulmonary disease)   . GERD (gastroesophageal reflux disease)   . H/O hiatal hernia   . Headache(784.0)     hx  . PONV (postoperative nausea and vomiting)   . Seizures     on meds   Past Surgical History  Procedure Laterality Date  . Facial tumor removal  Right 2000  . Brain tumor removal  09  . Brain surgery      in [place]  . Video bronchoscopy  12/04/2012    Procedure: VIDEO BRONCHOSCOPY;  Surgeon: Grace Isaac, MD;  Location: [facility] OR;  Service: Thoracic;  Laterality: N/A;  . Video assisted thoracoscopy (vats)/wedge resection  12/04/2012    Procedure: VIDEO ASSISTED THORACOSCOPY (VATS)/WEDGE RESECTION;  Surgeon: Grace Isaac, MD;  Location: [place];  Service: Thoracic;  Laterality: Right;  . Lobectomy  12/04/2012    Procedure: LOBECTOMY;  Surgeon: Grace Isaac, MD;  Location: [place];  Service: Thoracic;  Laterality: Right;  completion of right upper lobectomy and lymph node disection, placement of on q pump  . Tubal ligation    . Tonsillectomy    . Anterior cervical decomp/discectomy fusion N/A 03/05/2014    Procedure: ANTERIOR CERVICAL DECOMPRESSION/DISCECTOMY FUSION 2 LEVELS;  Surgeon: Sinclair Ship, MD;  Location: [place];  Service: Orthopedics;  Laterality: N/A;  Anterior cervical decompression fusion, cervical 4-5, cervical 5-6 with instrumentation, allograft.   Family History  Problem Relation Age of Onset  . Hypertension Father     deceased  . Diabetes Father   . Heart disease Father   . Hyperlipidemia Father   . Hypertension Mother   . Hyperlipidemia Mother   . Diabetes Mother   .  Hypertension Sister   . Hyperlipidemia Sister   . Hypertension Brother   . Hyperlipidemia Brother    History  Substance Use Topics  . Smoking status: Former Smoker -- 0.25 packs/day for 10 years    Types: Cigarettes    Quit date: 07/07/2013  . Smokeless tobacco: Never Used     Comment: Smokes pk q 3 days   marajuna aug  . Alcohol Use: No     Comment: quit 56yrs ago   OB History    No data available     Review of Systems  All other systems negative except as documented in the HPI. All pertinent positives and negatives as reviewed in the HPI.  Allergies  Haldol; Acetaminophen; Ibuprofen; Metronidazole; Morphine and  related; and Penicillins  Home Medications   Prior to Admission medications   Medication Sig Start Date End Date Taking? Authorizing Provider  ALPRAZolam Duanne Moron) 1 MG tablet Take 0.5 mg by mouth 2 (two) times daily.     Historical Provider, MD  Cholecalciferol (VITAMIN D PO) Take 1 tablet by mouth once a week. monday    Historical Provider, MD  Cyanocobalamin (VITAMIN B-12 IJ) Inject as directed every 30 (thirty) days. Administered by Dr. York Ram around the 1st of each month    Historical Provider, MD  cyclobenzaprine (FLEXERIL) 5 MG tablet Take 5 mg by mouth 3 (three) times daily as needed for muscle spasms.    Historical Provider, MD  dicyclomine (BENTYL) 20 MG tablet Take 20 mg by mouth 3 (three) times daily with meals.    Historical Provider, MD  gabapentin (NEURONTIN) 400 MG capsule Take 400 mg by mouth 3 (three) times daily. Nerve pain    Historical Provider, MD  HYDROcodone-acetaminophen (NORCO) 5-325 MG per tablet Take 1-2 tablets by mouth every 6 (six) hours as needed for moderate pain. 11/27/14   Abigail Harris, PA-C  Ipratropium-Albuterol (COMBIVENT RESPIMAT) 20-100 MCG/ACT AERS respimat Inhale 2 puffs into the lungs daily as needed for wheezing or shortness of breath.     Historical Provider, MD  loratadine (CLARITIN) 10 MG tablet Take 10 mg by mouth daily.     Historical Provider, MD  Menthol-Methyl Salicylate (MUSCLE RUB) 10-15 % CREA Apply 1 application topically every other day. Left shoulder pain    Historical Provider, MD  metFORMIN (GLUCOPHAGE) 500 MG tablet Take 500 mg by mouth 2 (two) times daily with a meal.    Historical Provider, MD  metoprolol tartrate (LOPRESSOR) 25 MG tablet Take 25 mg by mouth 2 (two) times daily.  11/24/13   Historical Provider, MD  olmesartan-hydrochlorothiazide (BENICAR HCT) 40-25 MG per tablet Take 1 tablet by mouth daily.    Historical Provider, MD  omeprazole (PRILOSEC) 20 MG capsule Take 20 mg by mouth 2 (two) times daily before a meal.   01/16/14   Historical Provider, MD  OXcarbazepine (TRILEPTAL) 150 MG tablet Take 150 mg by mouth 3 (three) times daily.  11/24/13   Historical Provider, MD  simvastatin (ZOCOR) 20 MG tablet Take 20 mg by mouth at bedtime.     Historical Provider, MD  tiZANidine (ZANAFLEX) 4 MG tablet Take 4 mg by mouth every 8 (eight) hours as needed for muscle spasms.    Historical Provider, MD   BP 129/94 mmHg  Pulse 84  Temp(Src) 97.8 F (36.6 C) (Oral)  Resp 20  SpO2 96% Physical Exam  Constitutional: She is oriented to person, place, and time. She appears well-developed and well-nourished. No distress.  HENT:  Head:  Normocephalic and atraumatic.  Mouth/Throat: Oropharynx is clear and moist.  Eyes: Pupils are equal, round, and reactive to light.  Neck: Normal range of motion. Neck supple.  Cardiovascular: Normal rate, regular rhythm and normal heart sounds.  Exam reveals no gallop and no friction rub.   No murmur heard. Pulmonary/Chest: Effort normal and breath sounds normal. No respiratory distress.  Abdominal: Soft. Bowel sounds are normal. She exhibits no distension. There is tenderness. There is no rebound and no guarding.  Neurological: She is alert and oriented to person, place, and time. She exhibits normal muscle tone. Coordination normal.  Skin: Skin is warm and dry. No rash noted. No erythema.  Nursing note and vitals reviewed.   ED Course  Procedures (including critical care time) Labs Review Labs Reviewed  COMPREHENSIVE METABOLIC PANEL - Abnormal; Notable for the following:    GFR calc non Af Amer 86 (*)    All other components within normal limits  URINALYSIS, ROUTINE W REFLEX MICROSCOPIC - Abnormal; Notable for the following:    Color, Urine ORANGE (*)    APPearance CLOUDY (*)    Specific Gravity, Urine 1.035 (*)    Ketones, ur 15 (*)    Protein, ur 30 (*)    Leukocytes, UA SMALL (*)    All other components within normal limits  URINE MICROSCOPIC-ADD ON - Abnormal; Notable for  the following:    Squamous Epithelial / LPF MANY (*)    All other components within normal limits  URINE CULTURE  CBC WITH DIFFERENTIAL  LIPASE, BLOOD    Imaging Review Ct Abdomen Pelvis W Contrast  12/05/2014   CLINICAL DATA:  Abdominal pain for the past 2 weeks.  Nausea.  EXAM: CT ABDOMEN AND PELVIS WITH CONTRAST  TECHNIQUE: Multidetector CT imaging of the abdomen and pelvis was performed using the standard protocol following bolus administration of intravenous contrast.  CONTRAST:  120mL OMNIPAQUE IOHEXOL 300 MG/ML  SOLN  COMPARISON:  02/07/2014.  FINDINGS: Small number of tiny rounded areas of low density in the liver. Mildly prominent intrahepatic bile ducts with mild improvement. No extrahepatic biliary ductal dilatation is seen, with interval decrease in size of the common duct. The pancreatic duct in the head and neck of the pancreas is mildly dilated without significant change. The pancreatic duct in the tail of the pancreas is minimally dilated without significant change.  Normal appearing spleen, gallbladder, adrenal glands, kidneys, urinary bladder, uterus and ovaries. Small bilateral inguinal hernias containing fat. No gastrointestinal abnormalities or enlarged lymph nodes. Normal appearing appendix. No free peritoneal fluid or air.  Pedicle screw and rod fixation on the left at the L5-S1 level with an interbody bone plug. Grade 1 anterolisthesis at that level. Lumbar spine degenerative changes, including facet degenerative changes in the lower lumbar spine.  IMPRESSION: 1. Minimally dilated intrahepatic ducts, with improvement. In the absence of abnormal liver enzymes on the patient's blood work today, this is of doubtful clinical significance.  2. Stable mildly dilated pancreatic duct with no obstructing mass seen.  3. No significant change in a small number of tiny probable cysts in the liver.  4.  Small bilateral inguinal hernias containing fat.  5.  No acute abnormality.   Electronically  Signed   By: Enrique Sack M.D.   On: 12/05/2014 14:07     EKG Interpretation   Date/Time:  Saturday December 05 2014 10:01:41 EST Ventricular Rate:  88 PR Interval:  170 QRS Duration: 76 QT Interval:  356 QTC Calculation: 430 R  Axis:   80 Text Interpretation:  Normal sinus rhythm Normal ECG Confirmed by BEATON   MD, ROBERT ([id number]) on 12/05/2014 10:36:11 AM     I spoke with the patient about her results and all questions were answered.  I advised patient that she has got a need to follow-up with GI and have the HIDA scan performed at this time.  There is no significant abnormalities noted on her testing.  I have advised her to slowly increase her fluid intake, rest as much as possible MDM   Final diagnoses:  None      Brent General, PA-C 12/05/14 [place], MD 12/05/14 [phone]

## 2014-12-05 NOTE — ED Notes (Signed)
Pt reports that she has been having abd pain for the past couple weeks. Pt reports that she is suppose to have a scan done for her gallbladder. Pt denies any vomiting but reports some nausea.

## 2014-12-05 NOTE — Discharge Instructions (Signed)
Follow-up with your primary care doctor and GI specialist.  Return here as needed

## 2014-12-06 LAB — URINE CULTURE: Colony Count: 75000

## 2014-12-29 ENCOUNTER — Emergency Department (HOSPITAL_COMMUNITY)
Admission: EM | Admit: 2014-12-29 | Discharge: 2014-12-29 | Disposition: A | Discharge status: Home / Self Care | Payer: Medicare HMO | Attending: Emergency Medicine | Admitting: Emergency Medicine

## 2014-12-29 ENCOUNTER — Encounter (HOSPITAL_COMMUNITY): Payer: Self-pay

## 2014-12-29 DIAGNOSIS — M1990 Unspecified osteoarthritis, unspecified site: Secondary | ICD-10-CM | POA: Insufficient documentation

## 2014-12-29 DIAGNOSIS — H5712 Ocular pain, left eye: Secondary | ICD-10-CM

## 2014-12-29 DIAGNOSIS — J449 Chronic obstructive pulmonary disease, unspecified: Secondary | ICD-10-CM | POA: Diagnosis not present

## 2014-12-29 DIAGNOSIS — Z880 Allergy status to penicillin: Secondary | ICD-10-CM | POA: Diagnosis not present

## 2014-12-29 DIAGNOSIS — Z79899 Other long term (current) drug therapy: Secondary | ICD-10-CM | POA: Insufficient documentation

## 2014-12-29 DIAGNOSIS — G8929 Other chronic pain: Secondary | ICD-10-CM | POA: Insufficient documentation

## 2014-12-29 DIAGNOSIS — Z8619 Personal history of other infectious and parasitic diseases: Secondary | ICD-10-CM | POA: Diagnosis not present

## 2014-12-29 DIAGNOSIS — R011 Cardiac murmur, unspecified: Secondary | ICD-10-CM | POA: Insufficient documentation

## 2014-12-29 DIAGNOSIS — K219 Gastro-esophageal reflux disease without esophagitis: Secondary | ICD-10-CM | POA: Insufficient documentation

## 2014-12-29 DIAGNOSIS — E119 Type 2 diabetes mellitus without complications: Secondary | ICD-10-CM | POA: Insufficient documentation

## 2014-12-29 DIAGNOSIS — Z87891 Personal history of nicotine dependence: Secondary | ICD-10-CM | POA: Diagnosis not present

## 2014-12-29 DIAGNOSIS — F319 Bipolar disorder, unspecified: Secondary | ICD-10-CM | POA: Insufficient documentation

## 2014-12-29 DIAGNOSIS — R109 Unspecified abdominal pain: Secondary | ICD-10-CM | POA: Insufficient documentation

## 2014-12-29 DIAGNOSIS — Z85118 Personal history of other malignant neoplasm of bronchus and lung: Secondary | ICD-10-CM | POA: Insufficient documentation

## 2014-12-29 DIAGNOSIS — I10 Essential (primary) hypertension: Secondary | ICD-10-CM | POA: Insufficient documentation

## 2014-12-29 DIAGNOSIS — F419 Anxiety disorder, unspecified: Secondary | ICD-10-CM | POA: Diagnosis not present

## 2014-12-29 DIAGNOSIS — Z792 Long term (current) use of antibiotics: Secondary | ICD-10-CM | POA: Insufficient documentation

## 2014-12-29 LAB — URINE MICROSCOPIC-ADD ON

## 2014-12-29 LAB — COMPREHENSIVE METABOLIC PANEL
ALK PHOS: 104 U/L (ref 39–117)
ALT: 13 U/L (ref 0–35)
AST: 20 U/L (ref 0–37)
Albumin: 3.9 g/dL (ref 3.5–5.2)
Anion gap: 11 (ref 5–15)
BILIRUBIN TOTAL: 0.8 mg/dL (ref 0.3–1.2)
BUN: 15 mg/dL (ref 6–23)
CO2: 23 mmol/L (ref 19–32)
Calcium: 9.4 mg/dL (ref 8.4–10.5)
Chloride: 105 mmol/L (ref 96–112)
Creatinine, Ser: 0.59 mg/dL (ref 0.50–1.10)
GFR calc Af Amer: >90 mL/min (ref >90)
GFR calc non Af Amer: >90 mL/min (ref >90)
Glucose, Bld: 96 mg/dL (ref 70–99)
POTASSIUM: 3.7 mmol/L (ref 3.5–5.1)
Sodium: 139 mmol/L (ref 135–145)
TOTAL PROTEIN: 6.7 g/dL (ref 6.0–8.3)

## 2014-12-29 LAB — CBC WITH DIFFERENTIAL/PLATELET
BASOS PCT: 1 % (ref 0–1)
Basophils Absolute: 0 10*3/uL (ref 0.0–0.1)
EOS ABS: 0.1 10*3/uL (ref 0.0–0.7)
Eosinophils Relative: 2 % (ref 0–5)
HEMATOCRIT: 41.6 % (ref 36.0–46.0)
Hemoglobin: 13.6 g/dL (ref 12.0–15.0)
Lymphocytes Relative: 39 % (ref 12–46)
Lymphs Abs: 2 10*3/uL (ref 0.7–4.0)
MCH: 29.7 pg (ref 26.0–34.0)
MCHC: 32.7 g/dL (ref 30.0–36.0)
MCV: 90.8 fL (ref 78.0–100.0)
MONO ABS: 0.4 10*3/uL (ref 0.1–1.0)
MONOS PCT: 8 % (ref 3–12)
Neutro Abs: 2.5 10*3/uL (ref 1.7–7.7)
Neutrophils Relative %: 50 % (ref 43–77)
Platelets: 238 10*3/uL (ref 150–400)
RBC: 4.58 MIL/uL (ref 3.87–5.11)
RDW: 13.2 % (ref 11.5–15.5)
WBC: 5.1 10*3/uL (ref 4.0–10.5)

## 2014-12-29 LAB — LIPASE, BLOOD: LIPASE: 24 U/L (ref 11–59)

## 2014-12-29 LAB — URINALYSIS, ROUTINE W REFLEX MICROSCOPIC
BILIRUBIN URINE: NEGATIVE
Glucose, UA: NEGATIVE mg/dL
Hgb urine dipstick: NEGATIVE
Ketones, ur: NEGATIVE mg/dL
Nitrite: NEGATIVE
PROTEIN: NEGATIVE mg/dL
Specific Gravity, Urine: 1.014 (ref 1.005–1.030)
UROBILINOGEN UA: 1 mg/dL (ref 0.0–1.0)
pH: 6 (ref 5.0–8.0)

## 2014-12-29 MED ORDER — HYDROMORPHONE HCL 1 MG/ML IJ SOLN
1.0000 mg | Freq: Once | INTRAMUSCULAR | Status: AC
  Administered 2014-12-29: 1 mg via INTRAVENOUS
  Filled 2014-12-29: qty 1

## 2014-12-29 MED ORDER — TETRACAINE HCL 0.5 % OP SOLN
1.0000 [drp] | Freq: Once | OPHTHALMIC | Status: AC
  Administered 2014-12-29: 1 [drp] via OPHTHALMIC
  Filled 2014-12-29: qty 2

## 2014-12-29 MED ORDER — TOBRAMYCIN 0.3 % OP SOLN
1.0000 [drp] | Freq: Once | OPHTHALMIC | Status: AC
Start: 2014-12-29 — End: 2014-12-29
  Administered 2014-12-29: 1 [drp] via OPHTHALMIC
  Filled 2014-12-29: qty 5

## 2014-12-29 MED ORDER — HYDROMORPHONE HCL 1 MG/ML IJ SOLN
2.0000 mg | Freq: Once | INTRAMUSCULAR | Status: AC
  Administered 2014-12-29: 2 mg via INTRAMUSCULAR
  Filled 2014-12-29: qty 2

## 2014-12-29 MED ORDER — PROPARACAINE HCL 0.5 % OP SOLN
1.0000 [drp] | Freq: Once | OPHTHALMIC | Status: DC
  Filled 2014-12-29: qty 15

## 2014-12-29 MED ORDER — FLUORESCEIN SODIUM 1 MG OP STRP
1.0000 | ORAL_STRIP | Freq: Once | OPHTHALMIC | Status: DC
  Filled 2014-12-29: qty 1

## 2014-12-29 MED ORDER — ONDANSETRON HCL 4 MG/2ML IJ SOLN
4.0000 mg | Freq: Once | INTRAMUSCULAR | Status: AC
  Administered 2014-12-29: 4 mg via INTRAVENOUS
  Filled 2014-12-29: qty 2

## 2014-12-29 NOTE — ED Notes (Signed)
states abd pain is radiating to left leg, no numbness in leg

## 2014-12-29 NOTE — Discharge Instructions (Signed)
Please read and follow all provided instructions.  Your diagnoses today include:  1. Eye pain, left   2. Chronic abdominal pain     Tests performed today include:  Visual acuity testing to check your vision  Fluorescein dye examination to look for scratches on your eye  Tonometry to check the pressure inside of your eye - normal pressure  Blood counts and electrolytes - normal  Vital signs. See below for your results today.   Medications prescribed:   Tobrex (tobramycin) - antibiotic eye drops or eye ointment  Use this medication as follows:  Use 1-2 drops in affected eye every 4 hours while awake for 5 days.  Take any prescribed medications only as directed.  Home care instructions:  Follow any educational materials contained in this packet.   You may contact the stomach doctor listed for further evaluation of your abdominal pain.  Follow-up instructions: Please follow-up with the opthalmologist (eye specialist) listed tomorrow for further evaluation of your symptoms.  Return instructions:   Please return to the Emergency Department if you experience worsening symptoms.   Please return immediately if you develop severe pain, pus drainage, new change in vision, or fever.  Please return if you have any other emergent concerns.  Additional Information:  Your vital signs today were: BP 143/76 mmHg   Pulse 79   Temp(Src) 98.4 F (36.9 C) (Oral)   Resp 14   SpO2 100% If your blood pressure (BP) was elevated above 135/85 this visit, please have this repeated by your doctor within one month. ---------------

## 2014-12-29 NOTE — ED Notes (Signed)
Pt states took pain pill at 0530

## 2014-12-29 NOTE — ED Notes (Signed)
Spoke with pharmacy medication proparacaine not in pyxis currently grayed out. Will send via tube system.

## 2014-12-29 NOTE — ED Provider Notes (Signed)
CSN: [id number]     Arrival date & time 12/29/14  1046 History   First MD Initiated Contact with Patient 12/29/14 1126     Chief Complaint  Patient presents with  . Eye Pain  . Abdominal Pain     (Consider location/radiation/quality/duration/timing/severity/associated sxs/prior Treatment) HPI Comments: Patient with history of diabetes on metformin presents with complaint of left eye pain, redness, discharge for the past 1 week. Patient has pain with movement of the eye but does not have significant photophobia. She has not had any vision loss but states that her vision is slightly more blurry than usual. No injury or foreign bodies in the eye. Patient has associated left facial pain and headache. No fever or chills. No history of surgeries on the eye. Patient thought that she had a sinus infection and took over-the-counter medications without relief. Patient states that she saw her PCP was prescribed gentamicin drops which have not helped. No contact lenses. The onset of this condition was acute. The course is constant.     Patient has continued abdominal pain on the left side of her abdomen which is ongoing. She had a CT scan 3 weeks ago which was normal. She was supposed to have HIDA scan. She states that she is unable to follow-up with her current GI physician because she owes them money and she is requesting a new GI doctor.    [name] a 64 y.o. female presenting with eye pain and abdominal pain. The history is provided by the patient and medical records.  Eye Pain Associated symptoms include abdominal pain, headaches and nausea. Pertinent negatives include no chest pain, coughing, fever, myalgias, rash, sore throat or vomiting.  Abdominal Pain Associated symptoms: nausea   Associated symptoms: no chest pain, no cough, no diarrhea, no dysuria, no fever, no sore throat and no vomiting     Past Medical History  Diagnosis Date  . Hypertension   . Diabetes mellitus   . Bipolar 1  disorder   . Heart murmur   . Sciatica   . Arthritis   . Anxiety   . Diverticulosis   . Chronic low back pain   . DDD (degenerative disc disease), lumbar   . Chronic shoulder pain   . Chronic neck pain   . Hepatitis B     Unclear when initially diagnosed, labs in Epic from 03/09/13  . Hepatitis C     Unclear when initially diagnosed, labs in Epic from 03/09/13  . C. difficile diarrhea     April and February 2014  . Adenocarcinoma of lung, stage 1   . COPD (chronic obstructive pulmonary disease)   . GERD (gastroesophageal reflux disease)   . H/O hiatal hernia   . Headache(784.0)     hx  . PONV (postoperative nausea and vomiting)   . Seizures     on meds   Past Surgical History  Procedure Laterality Date  . Facial tumor removal Right 2000  . Brain tumor removal  09  . Brain surgery      in [place]  . Video bronchoscopy  12/04/2012    Procedure: VIDEO BRONCHOSCOPY;  Surgeon: Grace Isaac, MD;  Location: [place];  Service: Thoracic;  Laterality: N/A;  . Video assisted thoracoscopy (vats)/wedge resection  12/04/2012    Procedure: VIDEO ASSISTED THORACOSCOPY (VATS)/WEDGE RESECTION;  Surgeon: Grace Isaac, MD;  Location: [place];  Service: Thoracic;  Laterality: Right;  . Lobectomy  12/04/2012    Procedure: LOBECTOMY;  Surgeon:  Grace Isaac, MD;  Location: [facility];  Service: Thoracic;  Laterality: Right;  completion of right upper lobectomy and lymph node disection, placement of on q pump  . Tubal ligation    . Tonsillectomy    . Anterior cervical decomp/discectomy fusion N/A 03/05/2014    Procedure: ANTERIOR CERVICAL DECOMPRESSION/DISCECTOMY FUSION 2 LEVELS;  Surgeon: Sinclair Ship, MD;  Location: [facility];  Service: Orthopedics;  Laterality: N/A;  Anterior cervical decompression fusion, cervical 4-5, cervical 5-6 with instrumentation, allograft.   Family History  Problem Relation Age of Onset  . Hypertension Father     deceased  . Diabetes Father   . Heart  disease Father   . Hyperlipidemia Father   . Hypertension Mother   . Hyperlipidemia Mother   . Diabetes Mother   . Hypertension Sister   . Hyperlipidemia Sister   . Hypertension Brother   . Hyperlipidemia Brother    History  Substance Use Topics  . Smoking status: Former Smoker -- 0.25 packs/day for 10 years    Types: Cigarettes    Quit date: 07/07/2013  . Smokeless tobacco: Never Used     Comment: Smokes pk q 3 days   marajuna aug  . Alcohol Use: No     Comment: quit 41yrs ago   OB History    No data available     Review of Systems  Constitutional: Negative for fever.  HENT: Negative for rhinorrhea and sore throat.   Eyes: Positive for pain, discharge, redness and visual disturbance. Negative for photophobia and itching.  Respiratory: Negative for cough.   Cardiovascular: Negative for chest pain.  Gastrointestinal: Positive for nausea and abdominal pain. Negative for vomiting, diarrhea and blood in stool.  Genitourinary: Negative for dysuria.  Musculoskeletal: Negative for myalgias.  Skin: Negative for rash.  Neurological: Positive for headaches.    Allergies  Haldol; Acetaminophen; Ibuprofen; Metronidazole; Morphine and related; and Penicillins  Home Medications   Prior to Admission medications   Medication Sig Start Date End Date Taking? Authorizing Provider  ALPRAZolam Duanne Moron) 1 MG tablet Take 0.5 mg by mouth 2 (two) times daily.     Historical Provider, MD  Cholecalciferol (VITAMIN D PO) Take 1 tablet by mouth once a week. monday    Historical Provider, MD  Cyanocobalamin (VITAMIN B-12 IJ) Inject as directed every 30 (thirty) days. Administered by Dr. York Ram around the 1st of each month    Historical Provider, MD  cyclobenzaprine (FLEXERIL) 5 MG tablet Take 5 mg by mouth 3 (three) times daily as needed for muscle spasms.    Historical Provider, MD  dicyclomine (BENTYL) 20 MG tablet Take 20 mg by mouth 3 (three) times daily with meals.    Historical  Provider, MD  gabapentin (NEURONTIN) 400 MG capsule Take 400 mg by mouth 3 (three) times daily. Nerve pain    Historical Provider, MD  HYDROcodone-acetaminophen (NORCO) 5-325 MG per tablet Take 1-2 tablets by mouth every 6 (six) hours as needed for moderate pain. 11/27/14   Abigail Harris, PA-C  Ipratropium-Albuterol (COMBIVENT RESPIMAT) 20-100 MCG/ACT AERS respimat Inhale 2 puffs into the lungs daily as needed for wheezing or shortness of breath.     Historical Provider, MD  loratadine (CLARITIN) 10 MG tablet Take 10 mg by mouth daily.     Historical Provider, MD  Menthol-Methyl Salicylate (MUSCLE RUB) 10-15 % CREA Apply 1 application topically every other day. Left shoulder pain    Historical Provider, MD  metFORMIN (GLUCOPHAGE) 500 MG tablet Take 500  mg by mouth 2 (two) times daily with a meal.    Historical Provider, MD  metoprolol tartrate (LOPRESSOR) 25 MG tablet Take 25 mg by mouth 2 (two) times daily.  11/24/13   Historical Provider, MD  olmesartan-hydrochlorothiazide (BENICAR HCT) 40-25 MG per tablet Take 1 tablet by mouth daily.    Historical Provider, MD  omeprazole (PRILOSEC) 20 MG capsule Take 20 mg by mouth 2 (two) times daily before a meal.  01/16/14   Historical Provider, MD  OXcarbazepine (TRILEPTAL) 150 MG tablet Take 150 mg by mouth 3 (three) times daily.  11/24/13   Historical Provider, MD  oxyCODONE (ROXICODONE) 5 MG immediate release tablet Take 1 tablet (5 mg total) by mouth every 4 (four) hours as needed for severe pain. 12/05/14   [place], PA-C  promethazine (PHENERGAN) 25 MG tablet Take 1 tablet (25 mg total) by mouth every 8 (eight) hours as needed for nausea or vomiting. 12/05/14   [place], PA-C  simvastatin (ZOCOR) 20 MG tablet Take 20 mg by mouth at bedtime.     Historical Provider, MD  tiZANidine (ZANAFLEX) 4 MG tablet Take 4 mg by mouth every 8 (eight) hours as needed for muscle spasms.    Historical Provider, MD   BP 127/75 mmHg  Pulse 88  Temp(Src)  98.4 F (36.9 C) (Oral)  Resp 14  SpO2 100%   Physical Exam  Constitutional: She appears well-developed and well-nourished.  HENT:  Head: Normocephalic and atraumatic.  Eyes: Right eye exhibits no discharge. Left eye exhibits no discharge. Right conjunctiva is not injected. Right conjunctiva has no hemorrhage. Left conjunctiva is injected. Left conjunctiva has no hemorrhage. Right eye exhibits normal extraocular motion and no nystagmus. Left eye exhibits normal extraocular motion and no nystagmus. Right pupil is round and reactive. Left pupil is round and reactive. Pupils are equal.  Slit lamp exam:      The left eye shows no corneal abrasion, no corneal ulcer, no foreign body, no hyphema, no fluorescein uptake and no anterior chamber bulge.  Neck: Normal range of motion. Neck supple.  Cardiovascular: Normal rate, regular rhythm and normal heart sounds.   Pulmonary/Chest: Effort normal and breath sounds normal.  Abdominal: Soft. There is no tenderness.  Neurological: She is alert.  Skin: Skin is warm and dry.  Psychiatric: She has a normal mood and affect.  Nursing note and vitals reviewed.   ED Course  Procedures (including critical care time) Labs Review Labs Reviewed  URINALYSIS, ROUTINE W REFLEX MICROSCOPIC - Abnormal; Notable for the following:    APPearance CLOUDY (*)    Leukocytes, UA MODERATE (*)    All other components within normal limits  URINE MICROSCOPIC-ADD ON - Abnormal; Notable for the following:    Squamous Epithelial / LPF MANY (*)    Bacteria, UA FEW (*)    All other components within normal limits  URINE CULTURE  CBC WITH DIFFERENTIAL/PLATELET  COMPREHENSIVE METABOLIC PANEL  LIPASE, BLOOD    Imaging Review No results found.   EKG Interpretation None      12:32 PM Patient seen and examined. Work-up initiated.    Vital signs reviewed and are as follows: BP 127/75 mmHg  Pulse 88  Temp(Src) 98.4 F (36.9 C) (Oral)  Resp 14  SpO2 100%  Two  drops of tetracaine instilled into affected eye.   Fluorescein strip applied to affected eye. Wood's lamp used to assess for corneal abrasion. No corneal abrasion identified. No foreign bodies noted. No visible  hyphema.   Tonometry performed. Left eye pressure: 10, 10, 12  Patient tolerated procedure well without immediate complication.   Patient discussed with and seen by Dr. Jeneen Rinks. After discussion, exam is not consistent with orbital cellulitis and do not feel CT scan indicated at this time.   Patient was given IV zofran but IV infiltrated prior to IV dilaudid. This was reordered as IM.   Plan: No acute ophthalmologic emergency suspected. Patient will need ophtho f/u, referral given, patient asked to follow-up tomorrow. Patient was poorly compliant with gentamycin ointment. Will switch her to tobrex drops in hopes that she will be more compliant.   Abdominal pain is chronic. Patient provided with GI referral. She has had incomplete outpatient work-up.     Visual Acuity  Right Eye Distance: 20/50 Left Eye Distance: 20/70 Bilateral Distance: 20/40  Right Eye Near:   Left Eye Near:    Bilateral Near:      The patient was urged to return to the Emergency Department immediately with worsening of current symptoms, loss of vision, severe HA, worsening abdominal pain, persistent vomiting, blood noted in stools, fever, or any other concerns. The patient verbalized understanding.     MDM   Final diagnoses:  Eye pain, left  Chronic abdominal pain   Eye pain: No foreign bodies noted. No surrounding erythema, swelling, vision changes/loss, proptosis suspicious for orbital or periorbital cellulitis. No signs of iritis, no photophobia or pain with light shined into opposing eye. No signs of glaucoma, intraocular pressures normal. No symptoms of retinal detachment. No ophthalmologic emergency suspected. Outpatient referral given and encouraged.   Abd pain: Chronic, no new features. Patient  has had CT last month for same which was normal. Labs today are reassuring. Do not suspect new or worsening etiology. [name] on chronic pain medications at home. Vitals are stable, no fever. No signs of dehydration, tolerating PO's. Lungs are clear. Doubt  appendicitis, cholecystitis, pancreatitis, ruptured viscus, UTI, kidney stone, SBO, or any other emergent abdominal etiology. Supportive therapy and outpt f/u indicated with return if symptoms worsen.        Carlisle Cater, PA-C 12/29/14 [place], MD 01/02/15 2056

## 2014-12-29 NOTE — ED Notes (Signed)
Pt reports left eye pain/redness for 1.5 week. States she saw PCP for same and given Gentak 0.3% with no relief. Pt also reports left sided lower back pain/LLQ that radiates down left leg. Reports nausea. Denies vomiting/diarrhea. Denies CP/SOB. Denies hematuria. Pt in NAD.

## 2014-12-31 LAB — URINE CULTURE
CULTURE: NO GROWTH
Colony Count: NO GROWTH
Special Requests: NORMAL

## 2015-01-18 ENCOUNTER — Emergency Department (HOSPITAL_COMMUNITY)
Admission: EM | Admit: 2015-01-18 | Discharge: 2015-01-18 | Disposition: A | Discharge status: Home / Self Care | Payer: Medicare HMO | Attending: Emergency Medicine | Admitting: Emergency Medicine

## 2015-01-18 ENCOUNTER — Emergency Department (HOSPITAL_COMMUNITY): Payer: Medicare HMO

## 2015-01-18 ENCOUNTER — Encounter (HOSPITAL_COMMUNITY): Payer: Self-pay | Admitting: Emergency Medicine

## 2015-01-18 DIAGNOSIS — R011 Cardiac murmur, unspecified: Secondary | ICD-10-CM | POA: Insufficient documentation

## 2015-01-18 DIAGNOSIS — F319 Bipolar disorder, unspecified: Secondary | ICD-10-CM | POA: Diagnosis not present

## 2015-01-18 DIAGNOSIS — Z85118 Personal history of other malignant neoplasm of bronchus and lung: Secondary | ICD-10-CM | POA: Insufficient documentation

## 2015-01-18 DIAGNOSIS — M545 Low back pain: Secondary | ICD-10-CM | POA: Insufficient documentation

## 2015-01-18 DIAGNOSIS — I10 Essential (primary) hypertension: Secondary | ICD-10-CM | POA: Insufficient documentation

## 2015-01-18 DIAGNOSIS — E119 Type 2 diabetes mellitus without complications: Secondary | ICD-10-CM | POA: Insufficient documentation

## 2015-01-18 DIAGNOSIS — M549 Dorsalgia, unspecified: Secondary | ICD-10-CM | POA: Diagnosis present

## 2015-01-18 DIAGNOSIS — K219 Gastro-esophageal reflux disease without esophagitis: Secondary | ICD-10-CM | POA: Diagnosis not present

## 2015-01-18 DIAGNOSIS — Z8619 Personal history of other infectious and parasitic diseases: Secondary | ICD-10-CM | POA: Insufficient documentation

## 2015-01-18 DIAGNOSIS — M1990 Unspecified osteoarthritis, unspecified site: Secondary | ICD-10-CM | POA: Insufficient documentation

## 2015-01-18 DIAGNOSIS — Z87891 Personal history of nicotine dependence: Secondary | ICD-10-CM | POA: Insufficient documentation

## 2015-01-18 DIAGNOSIS — J449 Chronic obstructive pulmonary disease, unspecified: Secondary | ICD-10-CM | POA: Insufficient documentation

## 2015-01-18 DIAGNOSIS — G40909 Epilepsy, unspecified, not intractable, without status epilepticus: Secondary | ICD-10-CM | POA: Insufficient documentation

## 2015-01-18 DIAGNOSIS — F419 Anxiety disorder, unspecified: Secondary | ICD-10-CM | POA: Diagnosis not present

## 2015-01-18 DIAGNOSIS — G8929 Other chronic pain: Secondary | ICD-10-CM | POA: Diagnosis not present

## 2015-01-18 DIAGNOSIS — Z880 Allergy status to penicillin: Secondary | ICD-10-CM | POA: Diagnosis not present

## 2015-01-18 DIAGNOSIS — Z79899 Other long term (current) drug therapy: Secondary | ICD-10-CM | POA: Diagnosis not present

## 2015-01-18 LAB — URINE MICROSCOPIC-ADD ON

## 2015-01-18 LAB — URINALYSIS, ROUTINE W REFLEX MICROSCOPIC
Bilirubin Urine: NEGATIVE
Glucose, UA: NEGATIVE mg/dL
Hgb urine dipstick: NEGATIVE
Ketones, ur: 15 mg/dL — AB
Nitrite: NEGATIVE
PH: 7 (ref 5.0–8.0)
Protein, ur: NEGATIVE mg/dL
Specific Gravity, Urine: 1.029 (ref 1.005–1.030)
Urobilinogen, UA: 1 mg/dL (ref 0.0–1.0)

## 2015-01-18 MED ORDER — OXYCODONE HCL 5 MG PO TABS: 5.0000 mg | ORAL_TABLET | ORAL | Status: DC | PRN

## 2015-01-18 MED ORDER — OXYCODONE HCL ER 10 MG PO T12A: 10.0000 mg | EXTENDED_RELEASE_TABLET | Freq: Two times a day (BID) | ORAL | Status: DC

## 2015-01-18 MED ORDER — OXYCODONE HCL 5 MG PO TABS
10.0000 mg | ORAL_TABLET | Freq: Once | ORAL | Status: AC
  Administered 2015-01-18: 10 mg via ORAL
  Filled 2015-01-18: qty 2

## 2015-01-18 NOTE — ED Notes (Signed)
Pt c/o rectal pain that radiates into right leg x several days; pt sts some diarrhea

## 2015-01-18 NOTE — Discharge Instructions (Signed)
Take the prescribed medication as directed. Follow-up with Dr. Lynann Bologna-- call to schedule appt. Return to the ED for new or worsening symptoms.

## 2015-01-18 NOTE — ED Provider Notes (Signed)
CSN: [id number]     Arrival date & time 01/18/15  1017 History   First MD Initiated Contact with Patient 01/18/15 1101     Chief Complaint  Patient presents with  . Rectal Pain     (Consider location/radiation/quality/duration/timing/severity/associated sxs/prior Treatment) The history is provided by the patient and medical records.   This is a 64 year old female with past medical history significant for hypertension, diabetes, bipolar disorder, sciatica, hepatitis B and hepatitis C, COPD, GERD, presenting to the ED for back pain.  Patient states pain began yesterday, radiates into her left buttock and rectum, and down into her left posterior thigh. She denies any numbness, paresthesias, or weakness. No loss of bowel or bladder control.  Patient states she has a long history of back issues and has had prior surgeries by doctor Dumonski.  She denies any recent injury, trauma, or falls. She does have concern that she incorrectly lifted a box over the weekend and strain her back.  She states usually she does not have rectal pain today she became concerned that there was something wrong. States her bowel movements are chronically irregular, very between constipation and diarrhea. She states this morning she did have a loose bowel movement, but it was nonbloody. She denies any current abdominal pain, fever, or chills. She does admit to frequent urination but denies any dysuria or hematuria.  Past Medical History  Diagnosis Date  . Hypertension   . Diabetes mellitus   . Bipolar 1 disorder   . Heart murmur   . Sciatica   . Arthritis   . Anxiety   . Diverticulosis   . Chronic low back pain   . DDD (degenerative disc disease), lumbar   . Chronic shoulder pain   . Chronic neck pain   . Hepatitis B     Unclear when initially diagnosed, labs in Epic from 03/09/13  . Hepatitis C     Unclear when initially diagnosed, labs in Epic from 03/09/13  . C. difficile diarrhea     April and February 2014  .  Adenocarcinoma of lung, stage 1   . COPD (chronic obstructive pulmonary disease)   . GERD (gastroesophageal reflux disease)   . H/O hiatal hernia   . Headache(784.0)     hx  . PONV (postoperative nausea and vomiting)   . Seizures     on meds   Past Surgical History  Procedure Laterality Date  . Facial tumor removal Right 2000  . Brain tumor removal  09  . Brain surgery      in [place]  . Video bronchoscopy  12/04/2012    Procedure: VIDEO BRONCHOSCOPY;  Surgeon: Grace Isaac, MD;  Location: [facility];  Service: Thoracic;  Laterality: N/A;  . Video assisted thoracoscopy (vats)/wedge resection  12/04/2012    Procedure: VIDEO ASSISTED THORACOSCOPY (VATS)/WEDGE RESECTION;  Surgeon: Grace Isaac, MD;  Location: [facility];  Service: Thoracic;  Laterality: Right;  . Lobectomy  12/04/2012    Procedure: LOBECTOMY;  Surgeon: Grace Isaac, MD;  Location: [place];  Service: Thoracic;  Laterality: Right;  completion of right upper lobectomy and lymph node disection, placement of on q pump  . Tubal ligation    . Tonsillectomy    . Anterior cervical decomp/discectomy fusion N/A 03/05/2014    Procedure: ANTERIOR CERVICAL DECOMPRESSION/DISCECTOMY FUSION 2 LEVELS;  Surgeon: Sinclair Ship, MD;  Location: [place];  Service: Orthopedics;  Laterality: N/A;  Anterior cervical decompression fusion, cervical 4-5, cervical 5-6 with instrumentation,  allograft.   Family History  Problem Relation Age of Onset  . Hypertension Father     deceased  . Diabetes Father   . Heart disease Father   . Hyperlipidemia Father   . Hypertension Mother   . Hyperlipidemia Mother   . Diabetes Mother   . Hypertension Sister   . Hyperlipidemia Sister   . Hypertension Brother   . Hyperlipidemia Brother    History  Substance Use Topics  . Smoking status: Former Smoker -- 0.25 packs/day for 10 years    Types: Cigarettes    Quit date: 07/07/2013  . Smokeless tobacco: Never Used     Comment: Smokes pk q 3 days    marajuna aug  . Alcohol Use: No     Comment: quit 17yrs ago   OB History    No data available     Review of Systems  Musculoskeletal: Positive for back pain.  All other systems reviewed and are negative.     Allergies  Haldol; Acetaminophen; Ibuprofen; Metronidazole; Morphine and related; and Penicillins  Home Medications   Prior to Admission medications   Medication Sig Start Date End Date Taking? Authorizing Provider  ALPRAZolam Duanne Moron) 1 MG tablet Take 0.5 mg by mouth 2 (two) times daily.     Historical Provider, MD  Cholecalciferol (VITAMIN D PO) Take 1 tablet by mouth once a week. monday    Historical Provider, MD  Cyanocobalamin (VITAMIN B-12 IJ) Inject as directed every 30 (thirty) days. Administered by Dr. York Ram around the 1st of each month    Historical Provider, MD  cyclobenzaprine (FLEXERIL) 5 MG tablet Take 5 mg by mouth 3 (three) times daily as needed for muscle spasms.    Historical Provider, MD  dicyclomine (BENTYL) 20 MG tablet Take 20 mg by mouth 3 (three) times daily with meals.    Historical Provider, MD  gabapentin (NEURONTIN) 400 MG capsule Take 400 mg by mouth 3 (three) times daily. Nerve pain    Historical Provider, MD  HYDROcodone-acetaminophen (NORCO) 5-325 MG per tablet Take 1-2 tablets by mouth every 6 (six) hours as needed for moderate pain. 11/27/14   Abigail Harris, PA-C  Ipratropium-Albuterol (COMBIVENT RESPIMAT) 20-100 MCG/ACT AERS respimat Inhale 2 puffs into the lungs daily as needed for wheezing or shortness of breath.     Historical Provider, MD  loratadine (CLARITIN) 10 MG tablet Take 10 mg by mouth daily.     Historical Provider, MD  Menthol-Methyl Salicylate (MUSCLE RUB) 10-15 % CREA Apply 1 application topically every other day. Left shoulder pain    Historical Provider, MD  metFORMIN (GLUCOPHAGE) 500 MG tablet Take 500 mg by mouth 2 (two) times daily with a meal.    Historical Provider, MD  metoprolol tartrate (LOPRESSOR) 25 MG tablet  Take 25 mg by mouth 2 (two) times daily.  11/24/13   Historical Provider, MD  olmesartan-hydrochlorothiazide (BENICAR HCT) 40-25 MG per tablet Take 1 tablet by mouth daily.    Historical Provider, MD  omeprazole (PRILOSEC) 20 MG capsule Take 20 mg by mouth 2 (two) times daily before a meal.  01/16/14   Historical Provider, MD  OXcarbazepine (TRILEPTAL) 150 MG tablet Take 150 mg by mouth 3 (three) times daily.  11/24/13   Historical Provider, MD  oxyCODONE (ROXICODONE) 5 MG immediate release tablet Take 1 tablet (5 mg total) by mouth every 4 (four) hours as needed for severe pain. 12/05/14   Resa Miner Lawyer, PA-C  promethazine (PHENERGAN) 25 MG tablet Take 1 tablet (  25 mg total) by mouth every 8 (eight) hours as needed for nausea or vomiting. 12/05/14   [place], PA-C  simvastatin (ZOCOR) 20 MG tablet Take 20 mg by mouth at bedtime.     Historical Provider, MD  tiZANidine (ZANAFLEX) 4 MG tablet Take 4 mg by mouth every 8 (eight) hours as needed for muscle spasms.    Historical Provider, MD   BP 124/83 mmHg  Pulse 97  Temp(Src) 98.6 F (37 C) (Oral)  Resp 18  SpO2 95%   Physical Exam  Constitutional: She is oriented to person, place, and time. She appears well-developed and well-nourished. No distress.  HENT:  Head: Normocephalic and atraumatic.  Mouth/Throat: Oropharynx is clear and moist.  Eyes: Conjunctivae and EOM are normal. Pupils are equal, round, and reactive to light.  Neck: Normal range of motion. Neck supple.  Cardiovascular: Normal rate, regular rhythm and normal heart sounds.   Pulmonary/Chest: Effort normal and breath sounds normal. No respiratory distress. She has no wheezes.  Abdominal: Soft. Bowel sounds are normal. There is no tenderness. There is no guarding.  Genitourinary: Rectum normal.  Rectum normal in appearance, no-tender; no visible hemorrhoids, fissures; or signs of abscess formation; no bleeding  Musculoskeletal: Normal range of motion. She exhibits no  edema.       Lumbar back: She exhibits tenderness and pain.       Back:  Lumbar spine with well healed midline surgical scar; tenderness of left lumbar paraspinal region including SI joint; no gross bony deformities; full ROM maintained; normal strength and sensation of BLE  Neurological: She is alert and oriented to person, place, and time.  Skin: Skin is warm and dry. She is not diaphoretic.  Psychiatric: She has a normal mood and affect.  Nursing note and vitals reviewed.   ED Course  Procedures (including critical care time) Labs Review Labs Reviewed  URINALYSIS, ROUTINE W REFLEX MICROSCOPIC - Abnormal; Notable for the following:    Color, Urine AMBER (*)    APPearance CLOUDY (*)    Ketones, ur 15 (*)    Leukocytes, UA SMALL (*)    All other components within normal limits  URINE MICROSCOPIC-ADD ON - Abnormal; Notable for the following:    Squamous Epithelial / LPF FEW (*)    Bacteria, UA FEW (*)    Casts HYALINE CASTS (*)    All other components within normal limits    Imaging Review Dg Lumbar Spine Complete  01/18/2015   CLINICAL DATA:  Left lower back pain without injury.  EXAM: LUMBAR SPINE - COMPLETE 4+ VIEW  COMPARISON:  CT scan of February 07, 2014.  FINDINGS: Status post surgical posterior fusion of L5-S1 with left-sided intrapedicular screw placement. Interbody fusion is noted as well. Stable minimal grade 1 anterolisthesis is noted at this level. No acute fracture or other spondylolisthesis is noted. Mild degenerative disc disease is noted at L2-3 and L3-4 with anterior osteophyte formation.  IMPRESSION: Mild multilevel degenerative disc disease is noted. Postsurgical changes as described above. No acute abnormality seen in the lumbar spine.   Electronically Signed   By: Marijo Conception, M.D.   On: 01/18/2015 12:34     EKG Interpretation None      MDM   Final diagnoses:  Back pain, unspecified location   64 year old female with left lower back pain. She has  history of prior back surgeries by Dr. Lynann Bologna.  She denies recent injuries or falls, but states she may have lifted a box  incorrectly over the weekend. She denies any numbness, paresthesias, or weakness of her lower extremities. No loss of bowel or bladder control. She states her pain radiates around her rectum, I have inspected her rectum and there are no noted abnormalities. No signs of melena or hematochezia.  She admits to urinary frequency but denies dysuria. Abdominal exam benign.  Will obtain plain films of lumbar spine given her prior surgeries as well as UA. Oxycodone given for pain.  X-ray with postsurgical changes, no acute findings. UA appears contaminated. Patient remains without any red flag symptoms or focal deficits.  Pain has improved after oxycodone, will d/c home with same.  Patient to FU with Dr. Lynann Bologna.  Discussed plan with patient, he/she acknowledged understanding and agreed with plan of care.  Return precautions given for new or worsening symptoms.  Larene Pickett, PA-C 01/18/15 [place], MD 01/19/15 [phone]

## 2015-02-02 ENCOUNTER — Emergency Department (HOSPITAL_COMMUNITY): Payer: Medicare HMO

## 2015-02-02 ENCOUNTER — Emergency Department (HOSPITAL_COMMUNITY)
Admission: EM | Admit: 2015-02-02 | Discharge: 2015-02-02 | Disposition: A | Discharge status: Home / Self Care | Payer: Medicare HMO | Attending: Emergency Medicine | Admitting: Emergency Medicine

## 2015-02-02 ENCOUNTER — Encounter (HOSPITAL_COMMUNITY): Payer: Self-pay | Admitting: *Deleted

## 2015-02-02 DIAGNOSIS — Z87891 Personal history of nicotine dependence: Secondary | ICD-10-CM | POA: Diagnosis not present

## 2015-02-02 DIAGNOSIS — F419 Anxiety disorder, unspecified: Secondary | ICD-10-CM | POA: Insufficient documentation

## 2015-02-02 DIAGNOSIS — R52 Pain, unspecified: Secondary | ICD-10-CM

## 2015-02-02 DIAGNOSIS — M545 Low back pain: Secondary | ICD-10-CM | POA: Diagnosis present

## 2015-02-02 DIAGNOSIS — R197 Diarrhea, unspecified: Secondary | ICD-10-CM | POA: Insufficient documentation

## 2015-02-02 DIAGNOSIS — F319 Bipolar disorder, unspecified: Secondary | ICD-10-CM | POA: Diagnosis not present

## 2015-02-02 DIAGNOSIS — K5900 Constipation, unspecified: Secondary | ICD-10-CM | POA: Diagnosis not present

## 2015-02-02 DIAGNOSIS — Z79899 Other long term (current) drug therapy: Secondary | ICD-10-CM | POA: Insufficient documentation

## 2015-02-02 DIAGNOSIS — G8929 Other chronic pain: Secondary | ICD-10-CM | POA: Insufficient documentation

## 2015-02-02 DIAGNOSIS — J449 Chronic obstructive pulmonary disease, unspecified: Secondary | ICD-10-CM | POA: Insufficient documentation

## 2015-02-02 DIAGNOSIS — I10 Essential (primary) hypertension: Secondary | ICD-10-CM | POA: Insufficient documentation

## 2015-02-02 DIAGNOSIS — K219 Gastro-esophageal reflux disease without esophagitis: Secondary | ICD-10-CM | POA: Diagnosis not present

## 2015-02-02 DIAGNOSIS — M1990 Unspecified osteoarthritis, unspecified site: Secondary | ICD-10-CM | POA: Insufficient documentation

## 2015-02-02 DIAGNOSIS — R011 Cardiac murmur, unspecified: Secondary | ICD-10-CM | POA: Insufficient documentation

## 2015-02-02 DIAGNOSIS — Z8619 Personal history of other infectious and parasitic diseases: Secondary | ICD-10-CM | POA: Diagnosis not present

## 2015-02-02 DIAGNOSIS — E119 Type 2 diabetes mellitus without complications: Secondary | ICD-10-CM | POA: Insufficient documentation

## 2015-02-02 DIAGNOSIS — C7951 Secondary malignant neoplasm of bone: Secondary | ICD-10-CM | POA: Insufficient documentation

## 2015-02-02 DIAGNOSIS — Z880 Allergy status to penicillin: Secondary | ICD-10-CM | POA: Diagnosis not present

## 2015-02-02 MED ORDER — HYDROMORPHONE HCL 1 MG/ML IJ SOLN
2.0000 mg | Freq: Once | INTRAMUSCULAR | Status: AC
  Administered 2015-02-02: 2 mg via INTRAMUSCULAR
  Filled 2015-02-02: qty 2

## 2015-02-02 NOTE — ED Provider Notes (Signed)
Patient received in sign out from [place] at shift change.  Briefly, 65 year old female with chronic back pain. She is followed by orthopedic spine surgeon, Dr. Lynann Bologna.  She was scheduled for an outpatient MRI today due to worsening symptoms, but was unable to lay flat for the scan. She was sent to the ED for pain control.  She was noted to be without red flag symptoms or focal neurologic deficits by previous provider. She remains ambulatory.  Plan:  Patient has been given 2 mg Dilaudid, MRI pending. Will follow results and likely discharge.  Results for orders placed or performed during the hospital encounter of 01/18/15  Urinalysis, Routine w reflex microscopic  Result Value Ref Range   Color, Urine AMBER (A) YELLOW   APPearance CLOUDY (A) CLEAR   Specific Gravity, Urine 1.029 1.005 - 1.030   pH 7.0 5.0 - 8.0   Glucose, UA NEGATIVE NEGATIVE mg/dL   Hgb urine dipstick NEGATIVE NEGATIVE   Bilirubin Urine NEGATIVE NEGATIVE   Ketones, ur 15 (A) NEGATIVE mg/dL   Protein, ur NEGATIVE NEGATIVE mg/dL   Urobilinogen, UA 1.0 0.0 - 1.0 mg/dL   Nitrite NEGATIVE NEGATIVE   Leukocytes, UA SMALL (A) NEGATIVE  Urine microscopic-add on  Result Value Ref Range   Squamous Epithelial / LPF FEW (A) RARE   WBC, UA 0-2 <3 WBC/hpf   RBC / HPF 0-2 <3 RBC/hpf   Bacteria, UA FEW (A) RARE   Casts HYALINE CASTS (A) NEGATIVE   Urine-Other MUCOUS PRESENT    Dg Lumbar Spine Complete  01/18/2015   CLINICAL DATA:  Left lower back pain without injury.  EXAM: LUMBAR SPINE - COMPLETE 4+ VIEW  COMPARISON:  CT scan of February 07, 2014.  FINDINGS: Status post surgical posterior fusion of L5-S1 with left-sided intrapedicular screw placement. Interbody fusion is noted as well. Stable minimal grade 1 anterolisthesis is noted at this level. No acute fracture or other spondylolisthesis is noted. Mild degenerative disc disease is noted at L2-3 and L3-4 with anterior osteophyte formation.  IMPRESSION: Mild multilevel degenerative  disc disease is noted. Postsurgical changes as described above. No acute abnormality seen in the lumbar spine.   Electronically Signed   By: Marijo Conception, M.D.   On: 01/18/2015 12:34   Mr Lumbar Spine Wo Contrast  02/02/2015   CLINICAL DATA:  64 year old female with unexplained lumbar back pain. Personal history of right lung cancer in 2013. Current history of hepatitis.  EXAM: MRI LUMBAR SPINE WITHOUT CONTRAST  TECHNIQUE: Multiplanar, multisequence MR imaging of the lumbar spine was performed. No intravenous contrast was administered.  COMPARISON:  CT Abdomen and Pelvis 12/05/2014 and earlier. [facility] lumbar MRI 03/01/2013.  FINDINGS: New since 2014 numerous T1 hypo intense T2 heterogeneous and STIR hyperintense bone lesions throughout the visible skeleton, including the lower thoracic spine, lumbar spine, sacrum, and visible pelvis. Complete replacement of mid the T12 vertebral body marrow with subtle ventral epidural extension (series 800, image 6). No pathologic fracture identified. Most other visible lesions are 6-12 mm diameter.  No lumbar epidural or foraminal tumor identified.  Visualized lower thoracic spinal cord is normal with conus medularis at L1-L2. Normal noncontrast appearance of the cauda equina nerve roots.  Stable visualized abdominal viscera.  Preexisting superimposed L5-S1 postoperative changes in the interbody space and on the left. No significant degenerative lumbar spinal stenosis.  IMPRESSION: 1. Diffuse osseous metastatic disease. 2. Near complete replacement of the T12 vertebral body by tumor. Subtle ventral epidural tumor extension. No  pathologic fracture or malignant neural impingement in the visualized spine. 3. Normal noncontrast appearance of the visualized lower spinal cord, and cauda equina   Electronically Signed   By: Genevie Ann M.D.   On: 02/02/2015 17:56    MRI results with diffuse bony metastatic disease, T12 nearly consumed by tumor. There is  no noted spinal impingement or stenosis. Normal appearance of cauda equina. On repeat evaluation, patient remains without focal neurologic deficits. She states her pain is much better. I have discussed results of MRI at length with her, she is tearful but acknowledged understanding. 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  This was removed by Dr. Pia Mau with clean margins and her follow-up scans since this time has been as per last office note in October 2015, however she continues to smoke daily. Patient also states she has had a colonoscopy and was told to her intestines were "bad" but does not recall if she had polyps. She states she owes her GI doctor over $300 and he will no longer see her. She is not up-to-date on her mammogram. I have discussed need for emergent follow-up with PCP and oncology to establish primary where primary care is located and develop a treatment plan.  I have also sent message to her surgeon, Dr. Lynann Bologna, on EPIC with copy of results.  She will continue her home pain medications in the mean time.  Discussed plan with patient, he/she acknowledged understanding and agreed with plan of care.  Return precautions given for new or worsening symptoms.  Case discussed with Dr. Canary Brim who agrees with assessment and plan of care.  Larene Pickett, PA-C 02/02/15 1939  Alfonzo Beers, MD 02/02/15 1947

## 2015-02-02 NOTE — ED Provider Notes (Signed)
CSN: [id number]     Arrival date & time 02/02/15  1427 History  This chart was scribed for Clayton Bibles, PA-C with Merryl Hacker, MD by Edison Simon, ED Scribe. This patient was seen in room TR11C/TR11C and the patient's care was started at 3:50 PM.    Chief Complaint  Patient presents with  . Back Pain   HPI  HPI Comments: [name] [name] is a 64 y.o. female who presents to the Emergency Department complaining of chronic back pain.  She describes left-sided low back pain with onset 4-5 months ago. She states pain radiates down left leg and reports weakness and numbness in that leg. She reports abdominal pain that has been present for the past few months with associated constipation and diarrhea. She states she had similar back pain 2 years ago for which she had surgery with Dr. Lynann Bologna. She states she was trying to get an MRI today ordered by Dr. Lynann Bologna but was not able to tolerate it. She states prior x-ray revealed some bone spurs. She states she has used pain medication without improvement. She denies fever, bowel incontinence, or bladder incontinence.  States the pain she is feeling is exactly like her chronic back pain and the reason she came to the ED is simply because she could not tolerate the MRI.  The MRI was taking place at an outside facility.    Past Medical History  Diagnosis Date  . Hypertension   . Diabetes mellitus   . Bipolar 1 disorder   . Heart murmur   . Sciatica   . Arthritis   . Anxiety   . Diverticulosis   . Chronic low back pain   . DDD (degenerative disc disease), lumbar   . Chronic shoulder pain   . Chronic neck pain   . Hepatitis B     Unclear when initially diagnosed, labs in Epic from 03/09/13  . Hepatitis C     Unclear when initially diagnosed, labs in Epic from 03/09/13  . C. difficile diarrhea     April and February 2014  . Adenocarcinoma of lung, stage 1   . COPD (chronic obstructive pulmonary disease)   . GERD (gastroesophageal reflux disease)    . H/O hiatal hernia   . Headache(784.0)     hx  . PONV (postoperative nausea and vomiting)   . Seizures     on meds   Past Surgical History  Procedure Laterality Date  . Facial tumor removal Right 2000  . Brain tumor removal  09  . Brain surgery      in [place]  . Video bronchoscopy  12/04/2012    Procedure: VIDEO BRONCHOSCOPY;  Surgeon: Grace Isaac, MD;  [facility];  Service: Thoracic;  Laterality: N/A;  . Video assisted thoracoscopy (vats)/wedge resection  12/04/2012    Procedure: VIDEO ASSISTED THORACOSCOPY (VATS)/WEDGE RESECTION;  Surgeon: Grace Isaac, MD;  Location: [place];  Service: Thoracic;  Laterality: Right;  . Lobectomy  12/04/2012    Procedure: LOBECTOMY;  Surgeon: Grace Isaac, MD;  Location: [place];  Service: Thoracic;  Laterality: Right;  completion of right upper lobectomy and lymph node disection, placement of on q pump  . Tubal ligation    . Tonsillectomy    . Anterior cervical decomp/discectomy fusion N/A 03/05/2014    Procedure: ANTERIOR CERVICAL DECOMPRESSION/DISCECTOMY FUSION 2 LEVELS;  Surgeon: Sinclair Ship, MD;  Location: [place];  Service: Orthopedics;  Laterality: N/A;  Anterior cervical decompression fusion, cervical 4-5,  cervical 5-6 with instrumentation, allograft.   Family History  Problem Relation Age of Onset  . Hypertension Father     deceased  . Diabetes Father   . Heart disease Father   . Hyperlipidemia Father   . Hypertension Mother   . Hyperlipidemia Mother   . Diabetes Mother   . Hypertension Sister   . Hyperlipidemia Sister   . Hypertension Brother   . Hyperlipidemia Brother    History  Substance Use Topics  . Smoking status: Former Smoker -- 0.25 packs/day for 10 years    Types: Cigarettes    Quit date: 07/07/2013  . Smokeless tobacco: Never Used     Comment: Smokes pk q 3 days   marajuna aug  . Alcohol Use: No     Comment: quit 59yrs ago   OB History    No data available     Review of Systems   Constitutional: Negative for fever and chills.  Gastrointestinal: Positive for abdominal pain, diarrhea and constipation.  Musculoskeletal: Positive for back pain. Negative for gait problem.  Skin: Negative for color change.  Allergic/Immunologic: Positive for immunocompromised state.  Neurological: Positive for weakness and numbness.      Allergies  Haldol; Acetaminophen; Ibuprofen; Metronidazole; Morphine and related; and Penicillins  Home Medications   Prior to Admission medications   Medication Sig Start Date End Date Taking? Authorizing Provider  ALPRAZolam Duanne Moron) 1 MG tablet Take 0.5 mg by mouth 2 (two) times daily.     Historical Provider, MD  Cholecalciferol (VITAMIN D PO) Take 1 tablet by mouth once a week. monday    Historical Provider, MD  Cyanocobalamin (VITAMIN B-12 IJ) Inject as directed every 30 (thirty) days. Administered by Dr. York Ram around the 1st of each month    Historical Provider, MD  dicyclomine (BENTYL) 20 MG tablet Take 20 mg by mouth 3 (three) times daily with meals.    Historical Provider, MD  gabapentin (NEURONTIN) 400 MG capsule Take 400 mg by mouth 3 (three) times daily. Nerve pain    Historical Provider, MD  HYDROcodone-acetaminophen (NORCO) 10-325 MG per tablet Take 1 tablet by mouth every 6 (six) hours as needed.    Historical Provider, MD  HYDROcodone-acetaminophen (NORCO) 5-325 MG per tablet Take 1-2 tablets by mouth every 6 (six) hours as needed for moderate pain. Patient not taking: Reported on 01/18/2015 11/27/14   Margarita Mail, PA-C  Ipratropium-Albuterol (COMBIVENT RESPIMAT) 20-100 MCG/ACT AERS respimat Inhale 2 puffs into the lungs daily as needed for wheezing or shortness of breath.     Historical Provider, MD  KLOR-CON M20 20 MEQ tablet Take 20 mEq by mouth daily. with food 01/04/15   Historical Provider, MD  loratadine (CLARITIN) 10 MG tablet Take 10 mg by mouth daily.     Historical Provider, MD  Menthol-Methyl Salicylate (MUSCLE  RUB) 10-15 % CREA Apply 1 application topically every other day. Left shoulder pain    Historical Provider, MD  metFORMIN (GLUCOPHAGE) 500 MG tablet Take 500 mg by mouth 2 (two) times daily with a meal.    Historical Provider, MD  metoprolol tartrate (LOPRESSOR) 25 MG tablet Take 25 mg by mouth 2 (two) times daily.  11/24/13   Historical Provider, MD  olmesartan-hydrochlorothiazide (BENICAR HCT) 40-25 MG per tablet Take 1 tablet by mouth daily.    Historical Provider, MD  omeprazole (PRILOSEC) 20 MG capsule Take 20 mg by mouth 2 (two) times daily before a meal.  01/16/14   Historical Provider, MD  ondansetron [facility])  8 MG tablet Take 1 tablet by mouth daily as needed. 12/21/14   Historical Provider, MD  OXcarbazepine (TRILEPTAL) 150 MG tablet Take 150 mg by mouth 3 (three) times daily.  11/24/13   Historical Provider, MD  oxyCODONE (OXY IR/ROXICODONE) 5 MG immediate release tablet Take 1 tablet (5 mg total) by mouth every 4 (four) hours as needed for severe pain. 01/18/15   Larene Pickett, PA-C  promethazine (PHENERGAN) 25 MG tablet Take 1 tablet (25 mg total) by mouth every 8 (eight) hours as needed for nausea or vomiting. Patient not taking: Reported on 01/18/2015 12/05/14   Dalia Heading, PA-C  simvastatin (ZOCOR) 20 MG tablet Take 20 mg by mouth at bedtime.     Historical Provider, MD  tiZANidine (ZANAFLEX) 4 MG tablet Take 4 mg by mouth every 8 (eight) hours as needed for muscle spasms.    Historical Provider, MD   BP 100/61 mmHg  Pulse 103  Temp(Src) 98.6 F (37 C) (Oral)  Resp 16  SpO2 94% Physical Exam  Constitutional: She appears well-developed and well-nourished. No distress.  HENT:  Head: Normocephalic and atraumatic.  Neck: Neck supple.  Pulmonary/Chest: Effort normal.  Abdominal: Soft. She exhibits no distension and no mass. There is no tenderness. There is no rebound and no guarding.  Musculoskeletal:  Diffusely tender through lumbar spine, left lower back, and left buttock, no  crepitus, or stepoffs. Lower extremities:  Strength 5/5, Sensation is intact on left but decreased compared to the right, distal pulses intact.        Neurological: She is alert.  Skin: She is not diaphoretic.  Nursing note and vitals reviewed.   ED Course  Procedures (including critical care time)  DIAGNOSTIC STUDIES: Oxygen Saturation is 94% on room air, adequate by my interpretation.    COORDINATION OF CARE: 3:57 PM Discussed treatment plan with patient at beside, the patient agrees with the plan and has no further questions at this time.   Labs Review Labs Reviewed - No data to display  Imaging Review No results found.   EKG Interpretation None      MDM   Final diagnoses:  Pain    Afebrile, nontoxic patient with exacerbation of chronic back pain.  No red flags with history or exam.  Neurovascularly intact.  She came to ED after attempting to get outpatient MRI ordered by Dr Lynann Bologna, who did her prior surgeries.  She came to ED for uncontrolled pain.  Pain medication given and MRI ordered as pt was to have it done today.  Anticipate discharge home with follow up with Dr Lynann Bologna.  Pt signed out to Quincy Carnes, PA-C, at change of shift.       I personally performed the services described in this documentation, which was scribed in my presence. The recorded information has been reviewed and is accurate.   Clayton Bibles, PA-C 02/02/15 [place], MD 02/02/15 2148

## 2015-02-02 NOTE — ED Notes (Signed)
Pt states she took hydrocodone 3 hours ago and it "knocked the edge off, but it wore off". Pt states she tried to get a MRI today but was unable to tolerate.

## 2015-02-02 NOTE — Discharge Instructions (Signed)
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  It is extremely important that you follow-up with Dr. Lynann Bologna as well as the cancer center as soon as possible.  There is contact information provided for both offices. You will also need to inform your primary care physician of this in case formal referrals are needed. Continue taking her home pain medication as directed. Return to the ED for any new or worsening symptoms including numbness, weakness, inability to walk, high fever, loss of bowel or bladder control, etc.

## 2015-02-02 NOTE — ED Notes (Signed)
Pt states that she is having lower back pain that radiates into leg. Pt was sent for an MRI today but was not able to lay flat for it. Pt states that this is the same pain that she has had.

## 2015-02-02 NOTE — ED Notes (Signed)
Declined W/C at D/C and was escorted to lobby by RN. 

## 2015-02-03 ENCOUNTER — Telehealth: Payer: Self-pay | Admitting: Hematology

## 2015-02-03 NOTE — Telephone Encounter (Signed)
Pt was in the ED on 02/02/15.  Pt was told by Clayton Bibles PA-C to call [place] to schedule appt. The appt is 02/11/15@1 :30 DX- Diffuse osseous metastatic disease Records in epic

## 2015-02-11 ENCOUNTER — Telehealth: Payer: Self-pay | Admitting: Hematology

## 2015-02-11 ENCOUNTER — Other Ambulatory Visit (HOSPITAL_COMMUNITY)
Admission: RE | Admit: 2015-02-11 | Discharge: 2015-02-11 | Disposition: A | Discharge status: Home / Self Care | Payer: Medicare HMO | Source: Ambulatory Visit | Attending: Hematology | Admitting: Hematology

## 2015-02-11 ENCOUNTER — Other Ambulatory Visit (HOSPITAL_BASED_OUTPATIENT_CLINIC_OR_DEPARTMENT_OTHER): Payer: Medicare HMO

## 2015-02-11 ENCOUNTER — Ambulatory Visit (HOSPITAL_BASED_OUTPATIENT_CLINIC_OR_DEPARTMENT_OTHER): Payer: Medicare HMO | Admitting: Hematology

## 2015-02-11 ENCOUNTER — Encounter: Payer: Self-pay | Admitting: Hematology

## 2015-02-11 ENCOUNTER — Ambulatory Visit: Payer: Medicare HMO

## 2015-02-11 VITALS — BP 124/70 | HR 73 | Temp 98.0°F | Resp 18 | Ht 64.0 in | Wt 139.6 lb

## 2015-02-11 DIAGNOSIS — G893 Neoplasm related pain (acute) (chronic): Secondary | ICD-10-CM | POA: Diagnosis not present

## 2015-02-11 DIAGNOSIS — C7951 Secondary malignant neoplasm of bone: Secondary | ICD-10-CM | POA: Insufficient documentation

## 2015-02-11 DIAGNOSIS — Z85118 Personal history of other malignant neoplasm of bronchus and lung: Secondary | ICD-10-CM | POA: Diagnosis not present

## 2015-02-11 DIAGNOSIS — C3411 Malignant neoplasm of upper lobe, right bronchus or lung: Secondary | ICD-10-CM | POA: Diagnosis present

## 2015-02-11 DIAGNOSIS — B1920 Unspecified viral hepatitis C without hepatic coma: Secondary | ICD-10-CM

## 2015-02-11 LAB — CBC WITH DIFFERENTIAL/PLATELET
BASO%: 1.5 % (ref 0.0–2.0)
Basophils Absolute: 0.1 10*3/uL (ref 0.0–0.1)
EOS%: 1.1 % (ref 0.0–7.0)
Eosinophils Absolute: 0.1 10*3/uL (ref 0.0–0.5)
HCT: 39.1 % (ref 34.8–46.6)
HGB: 12.6 g/dL (ref 11.6–15.9)
LYMPH#: 2.1 10*3/uL (ref 0.9–3.3)
LYMPH%: 28.6 % (ref 14.0–49.7)
MCH: 28.7 pg (ref 25.1–34.0)
MCHC: 32.2 g/dL (ref 31.5–36.0)
MCV: 89.2 fL (ref 79.5–101.0)
MONO#: 0.5 10*3/uL (ref 0.1–0.9)
MONO%: 7.2 % (ref 0.0–14.0)
NEUT#: 4.6 10*3/uL (ref 1.5–6.5)
NEUT%: 61.6 % (ref 38.4–76.8)
Platelets: 246 10*3/uL (ref 145–400)
RBC: 4.38 10*6/uL (ref 3.70–5.45)
RDW: 13.5 % (ref 11.2–14.5)
WBC: 7.5 10*3/uL (ref 3.9–10.3)

## 2015-02-11 LAB — PROTIME-INR
INR: 1.15 (ref 0.00–1.49)
Prothrombin Time: 14.9 seconds (ref 11.6–15.2)

## 2015-02-11 LAB — APTT: APTT: 31 s (ref 24–37)

## 2015-02-11 NOTE — Progress Notes (Deleted)
.  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 female  with multiple comorbidities including untreatedihepatitis C , stage 1 right lung cancer status post lobectomy in 2014 , who was referred to our clinic because of  her recent abnormal MRI lumbar spine findings, which is highly suspicious for metastatic cancer.  She had lumber spine infusion and screws placement in sep 2014 for her back pain, which improved after surgery but she has had some residual back pain since then but was tolerable. She reprots worseing back for the past 4-5 month, and it bacame unbearable in the past few weeks and she presented to ED on 02/02/15. Lumber MRI was done which showed diffuse bone lesions which was highly syuspecious for metastases. She also developed worsening left leg weakness in the past 2 months, able to walk independently, and mild numbness at left foot. She has been limping lately due to the back pain, no urine or stool incontinuce. She takes oxycodone twice daily which was prescribed by her primary care physician.    She lost some of appetie, lost 15 lb in the last year, No fever or chills. (+) cough with yellow sputum, and some chest discomfort, no dyspnea on exertion.  She lives with her son, she is able to take care of herself, but not much else. She spends most of time sitting or laying down during daytime.   Her last mammogram was one years ago, last colonoscopy one years ago,  which were all normal normal per patient.   MEDICAL  HISTORY:  Past Medical History  Diagnosis Date  . Hypertension   . Diabetes mellitus   . Bipolar 1 disorder   . Heart murmur   . Sciatica   . Arthritis   . Anxiety   . Diverticulosis   . Chronic low back pain   . DDD (degenerative disc disease), lumbar   . Chronic shoulder pain   . Chronic neck pain   . Hepatitis B     Unclear when initially diagnosed, labs in Epic from 03/09/13  . Hepatitis C     Unclear when initially diagnosed, labs in Epic from 03/09/13  . C. difficile diarrhea     April and February 2014  . Adenocarcinoma of lung, stage 1   . COPD (chronic obstructive pulmonary disease)   . GERD (gastroesophageal reflux disease)   . H/O hiatal hernia   . Headache(784.0)     hx  . PONV (postoperative nausea and vomiting)   . Seizures     on meds    SURGICAL HISTORY: Past Surgical History  Procedure Laterality Date  . Facial tumor removal Right 2000  . Brain tumor removal  09  . Brain surgery      in [place]  . Video bronchoscopy  12/04/2012    Procedure: VIDEO BRONCHOSCOPY;  Surgeon: Edward B Gerhardt, MD;  Location: [facility] OR;  Service: Thoracic;  Laterality: N/A;  . Video assisted thoracoscopy (vats)/wedge resection  12/04/2012    Procedure: VIDEO ASSISTED THORACOSCOPY (VATS)/WEDGE RESECTION;  Surgeon: Edward B Gerhardt, MD;  Location: [facility] OR;  Service: Thoracic;    Laterality: Right;  . Lobectomy  12/04/2012    Procedure: LOBECTOMY;  Surgeon: Grace Isaac, MD;  Location: [facility];  Service: Thoracic;  Laterality: Right;  completion of right upper lobectomy and lymph node disection, placement of on q pump  . Tubal ligation    . Tonsillectomy    . Anterior cervical decomp/discectomy fusion N/A 03/05/2014    Procedure: ANTERIOR CERVICAL DECOMPRESSION/DISCECTOMY FUSION 2 LEVELS;  Surgeon: Sinclair Ship, MD;  Location: [facility];  Service: Orthopedics;  Laterality: N/A;  Anterior cervical decompression fusion, cervical 4-5, cervical 5-6 with instrumentation, allograft.     SOCIAL HISTORY: History   Social History  . Marital Status: Widowed    Spouse Name: N/A  . Number of Children: 5  . Years of Education: N/A   Occupational History  . n/a     patient [employer]   Social History Main Topics  . Smoking status: Former Smoker -- 0.25 packs/day for 10 years    Types: Cigarettes    Quit date: 07/07/2013  . Smokeless tobacco: Never Used     Comment: Smokes pk q 3 days   marajuna aug  . Alcohol Use: No     Comment: quit 45yr ago  . Drug Use: Yes    Special: Hydrocodone, Marijuana     Comment: "maybe about once a month"  . Sexual Activity: No   Other Topics Concern  . Not on file   Social History Narrative    FAMILY HISTORY: Family History  Problem Relation Age of Onset  . Hypertension Father     deceased  . Diabetes Father   . Heart disease Father   . Hyperlipidemia Father   . Hypertension Mother   . Hyperlipidemia Mother   . Diabetes Mother   . Hypertension Sister   . Hyperlipidemia Sister   . Hypertension Brother   . Hyperlipidemia Brother     ALLERGIES:  is allergic to haldol; acetaminophen; ibuprofen; metronidazole; morphine and related; and penicillins.  MEDICATIONS:  Current Outpatient Prescriptions  Medication Sig Dispense Refill  . ALPRAZolam (XANAX) 1 MG tablet Take 0.5 mg by mouth 2 (two) times daily.     . Cholecalciferol (VITAMIN D PO) Take 1 tablet by mouth once a week. monday    . Cyanocobalamin (VITAMIN B-12 IJ) Inject as directed every 30 (thirty) days. Administered by Dr. DYork Ramaround the 1st of each month    . dicyclomine (BENTYL) 20 MG tablet Take 20 mg by mouth 3 (three) times daily with meals.    . gabapentin (NEURONTIN) 400 MG capsule Take 400 mg by mouth 3 (three) times daily. Nerve pain    . Ipratropium-Albuterol (COMBIVENT RESPIMAT) 20-100 MCG/ACT AERS respimat Inhale 2 puffs into the lungs daily as needed for wheezing or shortness of breath.     .Marland KitchenKLOR-CON M20 20 MEQ tablet Take 20 mEq by  mouth daily. with food  6  . loratadine (CLARITIN) 10 MG tablet Take 10 mg by mouth daily.     . Menthol-Methyl Salicylate (MUSCLE RUB) 10-15 % CREA Apply 1 application topically every other day. Left shoulder pain    . metFORMIN (GLUCOPHAGE) 500 MG tablet Take 500 mg by mouth 2 (two) times daily with a meal.    . metoprolol tartrate (LOPRESSOR) 25 MG tablet Take 25 mg by mouth 2 (two) times daily.     .Marland Kitchenolmesartan-hydrochlorothiazide (BENICAR HCT) 40-25 MG per tablet Take 1 tablet by mouth daily.    .Marland Kitchenomeprazole (PRILOSEC) 20 MG  capsule Take 20 mg by mouth 2 (two) times daily before a meal.     . ondansetron (ZOFRAN) 8 MG tablet Take 1 tablet by mouth daily as needed.  5  . OXcarbazepine (TRILEPTAL) 150 MG tablet Take 150 mg by mouth 3 (three) times daily.     . Oxycodone HCl 10 MG TABS Take 10 mg by mouth 2 (two) times daily as needed. for pain  0  . promethazine (PHENERGAN) 25 MG tablet Take 1 tablet (25 mg total) by mouth every 8 (eight) hours as needed for nausea or vomiting. 15 tablet 0  . simvastatin (ZOCOR) 20 MG tablet Take 20 mg by mouth at bedtime.     . tiZANidine (ZANAFLEX) 4 MG tablet Take 4 mg by mouth every 8 (eight) hours as needed for muscle spasms.    . triamcinolone cream (KENALOG) 0.1 %     . promethazine-dextromethorphan (PROMETHAZINE-DM) 6.25-15 MG/5ML syrup   1   No current facility-administered medications for this visit.    REVIEW OF SYSTEMS:   Constitutional: Denies fevers, chills or abnormal night sweats, (+) malaise and weight loss  Eyes: Denies blurriness of vision, double vision or watery eyes Ears, nose, mouth, throat, and face: Denies mucositis or sore throat Respiratory:(+) cpugh, no dyspnea or wheezes Cardiovascular: Denies palpitation, chest discomfort or lower extremity swelling Gastrointestinal:  Denies nausea, heartburn or change in bowel habits Skin: Denies abnormal skin rashes Lymphatics: Denies new lymphadenopathy or easy  bruising Neurological:Denies numbness, tingling or new weaknesses Behavioral/Psych: Mood is stable, no new changes  All other systems were reviewed with the patient and are negative.  PHYSICAL EXAMINATION: ECOG PERFORMANCE STATUS: 2 - Symptomatic, <50% confined to bed  Filed Vitals:   02/11/15 1437  BP: 124/70  Pulse: 73  Temp: 98 F (36.7 C)  Resp: 18   Filed Weights   02/11/15 1437  Weight: 139 lb 9.6 oz (63.322 kg)    GENERAL:alert, no distress and comfortable SKIN: skin color, texture, turgor are normal, no rashes or significant lesions EYES: normal, conjunctiva are pink and non-injected, sclera clear OROPHARYNX:no exudate, no erythema and lips, buccal mucosa, and tongue normal  NECK: supple, thyroid normal size, non-tender, without nodularity LYMPH:  no palpable lymphadenopathy in the cervical, axillary or inguinal LUNGS: clear to auscultation and percussion with normal breathing effort HEART: regular rate & rhythm and no murmurs and no lower extremity edema ABDOMEN:abdomen soft, non-tender and normal bowel sounds Musculoskeletal:no cyanosis of digits and no clubbing no significant tenderness on spine  PSYCH: alert & oriented x 3 with fluent speech NEURO: no focal motor/sensory deficits Breasts: Breast inspection showed them to be symmetrical with no nipple discharge. Palpation of the breasts and axilla revealed no obvious mass that I could appreciate.   LABORATORY DATA:  I have reviewed the data as listed Lab Results  Component Value Date   WBC 5.1 12/29/2014   HGB 13.6 12/29/2014   HCT 41.6 12/29/2014   MCV 90.8 12/29/2014   PLT 238 12/29/2014    Recent Labs  11/27/14 1150 12/05/14 1122 12/29/14 1201  NA 140 139 139  K 3.7 3.9 3.7  CL 106 101 105  CO2 29 26 23  GLUCOSE 93 89 96  BUN 8 14 15  CREATININE 0.63 0.77 0.59  CALCIUM 9.2 9.8 9.4  GFRNONAA >90 86* >90  GFRAA >90 >90 >90  PROT 6.7 7.1 6.7  ALBUMIN 3.9 4.1 3.9  AST 21 26 20  ALT 13 13 13   ALKPHOS 91   88 104  BILITOT 0.6 0.7 0.8    RADIOGRAPHIC STUDIES: I have personally reviewed the radiological images as listed and agreed with the findings in the report. Dg Lumbar Spine Complete  01/18/2015   CLINICAL DATA:  Left lower back pain without injury.  EXAM: LUMBAR SPINE - COMPLETE 4+ VIEW  COMPARISON:  CT scan of February 07, 2014.  FINDINGS: Status post surgical posterior fusion of L5-S1 with left-sided intrapedicular screw placement. Interbody fusion is noted as well. Stable minimal grade 1 anterolisthesis is noted at this level. No acute fracture or other spondylolisthesis is noted. Mild degenerative disc disease is noted at L2-3 and L3-4 with anterior osteophyte formation.  IMPRESSION: Mild multilevel degenerative disc disease is noted. Postsurgical changes as described above. No acute abnormality seen in the lumbar spine.   Electronically Signed   By: Marijo Conception, M.D.   On: 01/18/2015 12:34   Mr Lumbar Spine Wo Contrast  02/02/2015   CLINICAL DATA:  64 year old female with unexplained lumbar back pain. Personal history of right lung cancer in 2013. Current history of hepatitis.  EXAM: MRI LUMBAR SPINE WITHOUT CONTRAST  TECHNIQUE: Multiplanar, multisequence MR imaging of the lumbar spine was performed. No intravenous contrast was administered.  COMPARISON:  CT Abdomen and Pelvis 12/05/2014 and earlier. [facility] lumbar MRI 03/01/2013.  FINDINGS: New since 2014 numerous T1 hypo intense T2 heterogeneous and STIR hyperintense bone lesions throughout the visible skeleton, including the lower thoracic spine, lumbar spine, sacrum, and visible pelvis. Complete replacement of mid the T12 vertebral body marrow with subtle ventral epidural extension (series 800, image 6). No pathologic fracture identified. Most other visible lesions are 6-12 mm diameter.  No lumbar epidural or foraminal tumor identified.  Visualized lower thoracic spinal cord is normal with conus medularis  at L1-L2. Normal noncontrast appearance of the cauda equina nerve roots.  Stable visualized abdominal viscera.  Preexisting superimposed L5-S1 postoperative changes in the interbody space and on the left. No significant degenerative lumbar spinal stenosis.  IMPRESSION: 1. Diffuse osseous metastatic disease. 2. Near complete replacement of the T12 vertebral body by tumor. Subtle ventral epidural tumor extension. No pathologic fracture or malignant neural impingement in the visualized spine. 3. Normal noncontrast appearance of the visualized lower spinal cord, and cauda equina   Electronically Signed   By: Genevie Ann M.D.   On: 02/02/2015 17:56    ASSESSMENT & PLAN:  64 year old female, with significant history of smoking and stage I right lung adenocarcinoma status post lobectomy in 2014, no presented with was any back pain and diffuse bone lesions on the lumbar spine MRI.  1. Diffuse bone lesions, likely metastatic cancer -I reviewed her lumbar MRI scan findings and images in person with her and her daughter. Her clinical presentation and scan findings are highly suspicious for metastatic cancer, possible from her prior lung cancer. -I recommend staging scan was PET to look for primary tumor or other metastatic lesions. Based on the PET scan findings,   we can decide where to biopsy to get tissue diagnosis -We discussed that this is incurable disease, and overall prognosis is very poor. -Treatment options would depend on the type of malignancy. -I'll refer her to radiation oncology to consider palliative spine radiation  2. Back pain and spine metastasis -She has diffuse bone metastasis in spine, especially T12 vertebral body weight that   All questions were answered. The patient knows to call the clinic with any problems, questions or concerns. I spent {CHL ONC TIME  VISIT - MZUAU:[id number]} counseling the patient face to face. The total time spent in the appointment was {CHL ONC TIME VISIT -  UFCZG:[id number]} and more than 50% was on counseling.     Truitt Merle, MD 02/11/2015 3:09 PM

## 2015-02-11 NOTE — Telephone Encounter (Signed)
Gave avs & calendar for April. Sent patient to have labs

## 2015-02-11 NOTE — Progress Notes (Signed)
Checked in new pt with no financial concerns at this time.  Pt has 2 insurances so financial assistance may not be needed but she has Raquel's card for any billing questions or concerns.

## 2015-02-11 NOTE — Progress Notes (Signed)
.  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 female  with multiple comorbidities including untreatedihepatitis C , stage 1 right lung cancer status post lobectomy in 2014 , who was referred to our clinic because of  her recent abnormal MRI lumbar spine findings, which is highly suspicious for metastatic cancer.  She had lumber spine infusion and screws placement in sep 2014 for her back pain, which improved after surgery but she has had some residual back pain since then but was tolerable. She reprots worseing back for the past 4-5 month, and it bacame unbearable in the past few weeks and she presented to ED on 02/02/15. Lumber MRI was done which showed diffuse bone lesions which was highly syuspecious for metastases. She also developed worsening left leg weakness in the past 2 months, able to walk independently, and mild numbness at left foot. She has been limping lately due to the back pain, no urine or stool incontinuce. She takes oxycodone twice daily which was prescribed by her primary care physician.    She lost some of appetie, lost 15 lb in the last year, No fever or chills. (+) cough with yellow sputum, and some chest discomfort, no dyspnea on exertion.  She lives with her son, she is able to take care of herself, but not much else. She spends most of time sitting or laying down during daytime.   Her last mammogram was one years ago, last colonoscopy one years ago,  which were all normal normal per patient.   MEDICAL  HISTORY:  Past Medical History  Diagnosis Date  . Hypertension   . Diabetes mellitus   . Bipolar 1 disorder   . Heart murmur   . Sciatica   . Arthritis   . Anxiety   . Diverticulosis   . Chronic low back pain   . DDD (degenerative disc disease), lumbar   . Chronic shoulder pain   . Chronic neck pain   . Hepatitis B     Unclear when initially diagnosed, labs in Epic from 03/09/13  . Hepatitis C     Unclear when initially diagnosed, labs in Epic from 03/09/13  . C. difficile diarrhea     April and February 2014  . Adenocarcinoma of lung, stage 1   . COPD (chronic obstructive pulmonary disease)   . GERD (gastroesophageal reflux disease)   . H/O hiatal hernia   . Headache(784.0)     hx  . PONV (postoperative nausea and vomiting)   . Seizures     on meds    SURGICAL HISTORY: Past Surgical History  Procedure Laterality Date  . Facial tumor removal Right 2000  . Brain tumor removal  09  . Brain surgery      in [place]  . Video bronchoscopy  12/04/2012    Procedure: VIDEO BRONCHOSCOPY;  Surgeon: Edward B Gerhardt, MD;  Location: [facility] OR;  Service: Thoracic;  Laterality: N/A;  . Video assisted thoracoscopy (vats)/wedge resection  12/04/2012    Procedure: VIDEO ASSISTED THORACOSCOPY (VATS)/WEDGE RESECTION;  Surgeon: Edward B Gerhardt, MD;  Location: [facility] OR;  Service: Thoracic;    Laterality: Right;  . Lobectomy  12/04/2012    Procedure: LOBECTOMY;  Surgeon: Grace Isaac, MD;  Location: [facility];  Service: Thoracic;  Laterality: Right;  completion of right upper lobectomy and lymph node disection, placement of on q pump  . Tubal ligation    . Tonsillectomy    . Anterior cervical decomp/discectomy fusion N/A 03/05/2014    Procedure: ANTERIOR CERVICAL DECOMPRESSION/DISCECTOMY FUSION 2 LEVELS;  Surgeon: Sinclair Ship, MD;  Location: [facility];  Service: Orthopedics;  Laterality: N/A;  Anterior cervical decompression fusion, cervical 4-5, cervical 5-6 with instrumentation, allograft.     SOCIAL HISTORY: History   Social History  . Marital Status: Widowed    Spouse Name: N/A  . Number of Children: 5  . Years of Education: N/A   Occupational History  . n/a     patient [employer]   Social History Main Topics  . Smoking status: Former Smoker -- 0.25 packs/day for 10 years    Types: Cigarettes    Quit date: 07/07/2013  . Smokeless tobacco: Never Used     Comment: Smokes pk q 3 days   marajuna aug  . Alcohol Use: No     Comment: quit 45yr ago  . Drug Use: Yes    Special: Hydrocodone, Marijuana     Comment: "maybe about once a month"  . Sexual Activity: No   Other Topics Concern  . Not on file   Social History Narrative    FAMILY HISTORY: Family History  Problem Relation Age of Onset  . Hypertension Father     deceased  . Diabetes Father   . Heart disease Father   . Hyperlipidemia Father   . Hypertension Mother   . Hyperlipidemia Mother   . Diabetes Mother   . Hypertension Sister   . Hyperlipidemia Sister   . Hypertension Brother   . Hyperlipidemia Brother     ALLERGIES:  is allergic to haldol; acetaminophen; ibuprofen; metronidazole; morphine and related; and penicillins.  MEDICATIONS:  Current Outpatient Prescriptions  Medication Sig Dispense Refill  . ALPRAZolam (XANAX) 1 MG tablet Take 0.5 mg by mouth 2 (two) times daily.     . Cholecalciferol (VITAMIN D PO) Take 1 tablet by mouth once a week. monday    . Cyanocobalamin (VITAMIN B-12 IJ) Inject as directed every 30 (thirty) days. Administered by Dr. DYork Ramaround the 1st of each month    . dicyclomine (BENTYL) 20 MG tablet Take 20 mg by mouth 3 (three) times daily with meals.    . gabapentin (NEURONTIN) 400 MG capsule Take 400 mg by mouth 3 (three) times daily. Nerve pain    . Ipratropium-Albuterol (COMBIVENT RESPIMAT) 20-100 MCG/ACT AERS respimat Inhale 2 puffs into the lungs daily as needed for wheezing or shortness of breath.     .Marland KitchenKLOR-CON M20 20 MEQ tablet Take 20 mEq by  mouth daily. with food  6  . loratadine (CLARITIN) 10 MG tablet Take 10 mg by mouth daily.     . Menthol-Methyl Salicylate (MUSCLE RUB) 10-15 % CREA Apply 1 application topically every other day. Left shoulder pain    . metFORMIN (GLUCOPHAGE) 500 MG tablet Take 500 mg by mouth 2 (two) times daily with a meal.    . metoprolol tartrate (LOPRESSOR) 25 MG tablet Take 25 mg by mouth 2 (two) times daily.     .Marland Kitchenolmesartan-hydrochlorothiazide (BENICAR HCT) 40-25 MG per tablet Take 1 tablet by mouth daily.    .Marland Kitchenomeprazole (PRILOSEC) 20 MG  capsule Take 20 mg by mouth 2 (two) times daily before a meal.     . ondansetron (ZOFRAN) 8 MG tablet Take 1 tablet by mouth daily as needed.  5  . OXcarbazepine (TRILEPTAL) 150 MG tablet Take 150 mg by mouth 3 (three) times daily.     . Oxycodone HCl 10 MG TABS Take 10 mg by mouth 2 (two) times daily as needed. for pain  0  . promethazine (PHENERGAN) 25 MG tablet Take 1 tablet (25 mg total) by mouth every 8 (eight) hours as needed for nausea or vomiting. 15 tablet 0  . simvastatin (ZOCOR) 20 MG tablet Take 20 mg by mouth at bedtime.     . tiZANidine (ZANAFLEX) 4 MG tablet Take 4 mg by mouth every 8 (eight) hours as needed for muscle spasms.    . triamcinolone cream (KENALOG) 0.1 %     . promethazine-dextromethorphan (PROMETHAZINE-DM) 6.25-15 MG/5ML syrup   1   No current facility-administered medications for this visit.    REVIEW OF SYSTEMS:   Constitutional: Denies fevers, chills or abnormal night sweats, (+) malaise and weight loss  Eyes: Denies blurriness of vision, double vision or watery eyes Ears, nose, mouth, throat, and face: Denies mucositis or sore throat Respiratory:(+) cpugh, no dyspnea or wheezes Cardiovascular: Denies palpitation, chest discomfort or lower extremity swelling Gastrointestinal:  Denies nausea, heartburn or change in bowel habits Skin: Denies abnormal skin rashes Lymphatics: Denies new lymphadenopathy or easy  bruising Neurological:Denies numbness, tingling or new weaknesses Behavioral/Psych: Mood is stable, no new changes  All other systems were reviewed with the patient and are negative.  PHYSICAL EXAMINATION: ECOG PERFORMANCE STATUS: 2 - Symptomatic, <50% confined to bed  Filed Vitals:   02/11/15 1437  BP: 124/70  Pulse: 73  Temp: 98 F (36.7 C)  Resp: 18   Filed Weights   02/11/15 1437  Weight: 139 lb 9.6 oz (63.322 kg)    GENERAL:alert, no distress and comfortable SKIN: skin color, texture, turgor are normal, no rashes or significant lesions EYES: normal, conjunctiva are pink and non-injected, sclera clear OROPHARYNX:no exudate, no erythema and lips, buccal mucosa, and tongue normal  NECK: supple, thyroid normal size, non-tender, without nodularity LYMPH:  no palpable lymphadenopathy in the cervical, axillary or inguinal LUNGS: clear to auscultation and percussion with normal breathing effort HEART: regular rate & rhythm and no murmurs and no lower extremity edema ABDOMEN:abdomen soft, non-tender and normal bowel sounds Musculoskeletal:no cyanosis of digits and no clubbing no significant tenderness on spine  PSYCH: alert & oriented x 3 with fluent speech NEURO: no focal motor/sensory deficits Breasts: Breast inspection showed them to be symmetrical with no nipple discharge. Palpation of the breasts and axilla revealed no obvious mass that I could appreciate.   LABORATORY DATA:  I have reviewed the data as listed Lab Results  Component Value Date   WBC 5.1 12/29/2014   HGB 13.6 12/29/2014   HCT 41.6 12/29/2014   MCV 90.8 12/29/2014   PLT 238 12/29/2014    Recent Labs  11/27/14 1150 12/05/14 1122 12/29/14 1201  NA 140 139 139  K 3.7 3.9 3.7  CL 106 101 105  CO2 29 26 23  GLUCOSE 93 89 96  BUN 8 14 15  CREATININE 0.63 0.77 0.59  CALCIUM 9.2 9.8 9.4  GFRNONAA >90 86* >90  GFRAA >90 >90 >90  PROT 6.7 7.1 6.7  ALBUMIN 3.9 4.1 3.9  AST 21 26 20  ALT 13 13 13   ALKPHOS 91   88 104  BILITOT 0.6 0.7 0.8    RADIOGRAPHIC STUDIES: I have personally reviewed the radiological images as listed and agreed with the findings in the report. Dg Lumbar Spine Complete  01/18/2015   CLINICAL DATA:  Left lower back pain without injury.  EXAM: LUMBAR SPINE - COMPLETE 4+ VIEW  COMPARISON:  CT scan of February 07, 2014.  FINDINGS: Status post surgical posterior fusion of L5-S1 with left-sided intrapedicular screw placement. Interbody fusion is noted as well. Stable minimal grade 1 anterolisthesis is noted at this level. No acute fracture or other spondylolisthesis is noted. Mild degenerative disc disease is noted at L2-3 and L3-4 with anterior osteophyte formation.  IMPRESSION: Mild multilevel degenerative disc disease is noted. Postsurgical changes as described above. No acute abnormality seen in the lumbar spine.   Electronically Signed   By: James  Green Jr, M.D.   On: 01/18/2015 12:34   Mr Lumbar Spine Wo Contrast  02/02/2015   CLINICAL DATA:  64-year-old female with unexplained lumbar back pain. Personal history of right lung cancer in 2013. Current history of hepatitis.  EXAM: MRI LUMBAR SPINE WITHOUT CONTRAST  TECHNIQUE: Multiplanar, multisequence MR imaging of the lumbar spine was performed. No intravenous contrast was administered.  COMPARISON:  CT Abdomen and Pelvis 12/05/2014 and earlier. [facility] lumbar MRI 03/01/2013.  FINDINGS: New since 2014 numerous T1 hypo intense T2 heterogeneous and STIR hyperintense bone lesions throughout the visible skeleton, including the lower thoracic spine, lumbar spine, sacrum, and visible pelvis. Complete replacement of mid the T12 vertebral body marrow with subtle ventral epidural extension (series 800, image 6). No pathologic fracture identified. Most other visible lesions are 6-12 mm diameter.  No lumbar epidural or foraminal tumor identified.  Visualized lower thoracic spinal cord is normal with conus medularis  at L1-L2. Normal noncontrast appearance of the cauda equina nerve roots.  Stable visualized abdominal viscera.  Preexisting superimposed L5-S1 postoperative changes in the interbody space and on the left. No significant degenerative lumbar spinal stenosis.  IMPRESSION: 1. Diffuse osseous metastatic disease. 2. Near complete replacement of the T12 vertebral body by tumor. Subtle ventral epidural tumor extension. No pathologic fracture or malignant neural impingement in the visualized spine. 3. Normal noncontrast appearance of the visualized lower spinal cord, and cauda equina   Electronically Signed   By: H  Hall M.D.   On: 02/02/2015 17:56    ASSESSMENT & PLAN:  64-year-old female, with significant history of smoking and stage I right lung adenocarcinoma status post lobectomy in 2014, no presented with was any back pain and diffuse bone lesions on the lumbar spine MRI.  1. Diffuse bone lesions, likely metastatic cancer -I reviewed her lumbar MRI scan findings and images in person with her and her daughter. Her clinical presentation and scan findings are highly suspicious for metastatic cancer, possible from her prior lung cancer. -I recommend staging scan was PET to look for primary tumor or other metastatic lesions. Based on the PET scan findings,   we can decide where to biopsy to get tissue diagnosis -We discussed that this is incurable disease, and overall prognosis is very poor. -Treatment options would depend on the type of malignancy. -I'll refer her to radiation oncology to consider palliative spine radiation  2. Back pain and spine metastasis -She has diffuse bone metastasis in spine, especially at T12 vertebral body,  No cold compression on scan. - I'll refer her to radiation oncology for palliative radiation.  3.  History of stage I lung    Adenocarcinoma status post resection - her tumor was tested for EGFR gene mutation and Houck translocation , which were negative.  4.  Untreated  hepatitis C,  Hypertension, diabetes, bipolar - She'll continue follow-up with her primary care physician  5. Back pain -Secondary to bone metastasis. -She'll continue oxycodone as needed, its overall controlled now.   Plan - lab today  - radiation oncology referral  - PET scan - return to clinic in 2 weeks to review the  PET scan finding and decided tissue biopsy   All questions were answered. The patient knows to call the clinic with any problems, questions or concerns. I spent 55 minutes counseling the patient face to face. The total time spent in the appointment was 60 minutes and more than 50% was on counseling.     , , MD 02/11/2015 3:09 PM      

## 2015-02-12 ENCOUNTER — Encounter: Payer: Self-pay | Admitting: Hematology

## 2015-02-12 NOTE — Progress Notes (Signed)
Histology and Location of Primary Cancer:Adenocarcinoma of right upper lobe lung diagnosed 12/03/2012.  Sites of Visceral and Bony Metastatic Disease:Spine mets especially T12, Right upper  Lobectomy and lymph node dissection  12/04/12 Dr. Madelyn Flavors  Location(s) of Symptomatic Metastases: T12  Past/Anticipated chemotherapy by medical oncology, if any:   Pain on a scale of 0-10 is:    If Spine Met(s), symptoms, if any, include:  Bowel/Bladder retention or incontinence    Numbness or weakness in extremities    Current Decadron regimen, if applicable:   Ambulatory status? Walker? Wheelchair?:   SAFETY ISSUES:  Prior radiation?   Pacemaker/ICD? No  Possible current pregnancy? N/A  Is the patient on methotrexate? No  Current Complaints / other details:  Widowed,  Bipolar,Heart murmur, hx seizures, Hep C+, Hep B+, Brain tumor removal 2009,  Right facial tumor removal and surgery in [place], 2000 Anterior cervical decompression/discectomy fusion 2 levels 03/05/14 Smoked Cigarettes 0.25ppdx 10 years, Marijuana "maybe once a month", quit alcohol 8 years ago   Allergies: Haldol,tylenol,ibuprofen,metronidazole,morphine and related, PCNS

## 2015-02-15 ENCOUNTER — Telehealth: Payer: Self-pay | Admitting: *Deleted

## 2015-02-15 ENCOUNTER — Ambulatory Visit
Admission: RE | Admit: 2015-02-15 | Discharge: 2015-02-15 | Disposition: A | Discharge status: Home / Self Care | Payer: Medicare HMO | Source: Ambulatory Visit | Attending: Radiation Oncology | Admitting: Radiation Oncology

## 2015-02-15 ENCOUNTER — Ambulatory Visit: Admission: RE | Admit: 2015-02-15 | Payer: Medicare HMO | Source: Ambulatory Visit

## 2015-02-15 DIAGNOSIS — Z87891 Personal history of nicotine dependence: Secondary | ICD-10-CM | POA: Insufficient documentation

## 2015-02-15 DIAGNOSIS — Z85118 Personal history of other malignant neoplasm of bronchus and lung: Secondary | ICD-10-CM | POA: Insufficient documentation

## 2015-02-15 DIAGNOSIS — I10 Essential (primary) hypertension: Secondary | ICD-10-CM | POA: Insufficient documentation

## 2015-02-15 DIAGNOSIS — E119 Type 2 diabetes mellitus without complications: Secondary | ICD-10-CM | POA: Insufficient documentation

## 2015-02-15 DIAGNOSIS — Z7951 Long term (current) use of inhaled steroids: Secondary | ICD-10-CM | POA: Insufficient documentation

## 2015-02-15 DIAGNOSIS — C7951 Secondary malignant neoplasm of bone: Secondary | ICD-10-CM

## 2015-02-15 DIAGNOSIS — Z79891 Long term (current) use of opiate analgesic: Secondary | ICD-10-CM | POA: Insufficient documentation

## 2015-02-15 DIAGNOSIS — M25519 Pain in unspecified shoulder: Secondary | ICD-10-CM | POA: Insufficient documentation

## 2015-02-15 DIAGNOSIS — J449 Chronic obstructive pulmonary disease, unspecified: Secondary | ICD-10-CM | POA: Insufficient documentation

## 2015-02-15 DIAGNOSIS — Z510 Encounter for antineoplastic radiation therapy: Secondary | ICD-10-CM | POA: Insufficient documentation

## 2015-02-15 DIAGNOSIS — Z794 Long term (current) use of insulin: Secondary | ICD-10-CM | POA: Insufficient documentation

## 2015-02-15 DIAGNOSIS — G8929 Other chronic pain: Secondary | ICD-10-CM | POA: Insufficient documentation

## 2015-02-15 DIAGNOSIS — Z902 Acquired absence of lung [part of]: Secondary | ICD-10-CM | POA: Insufficient documentation

## 2015-02-15 DIAGNOSIS — M542 Cervicalgia: Secondary | ICD-10-CM | POA: Insufficient documentation

## 2015-02-15 DIAGNOSIS — M545 Low back pain: Secondary | ICD-10-CM | POA: Insufficient documentation

## 2015-02-15 NOTE — Telephone Encounter (Signed)
Called patient phone, spoke and she stated,She meant to call and cancel today,she is still in [place]., asked if she wanted to reschedule ,"yes," so transferred call to Earleen Newport, willin basket Dr. Sondra Come 9:41 AM

## 2015-02-17 ENCOUNTER — Emergency Department (HOSPITAL_COMMUNITY): Payer: Medicare HMO

## 2015-02-17 ENCOUNTER — Other Ambulatory Visit (HOSPITAL_COMMUNITY): Payer: Self-pay

## 2015-02-17 ENCOUNTER — Ambulatory Visit
Admission: RE | Admit: 2015-02-17 | Discharge: 2015-02-17 | Disposition: A | Discharge status: Home / Self Care | Payer: Medicare HMO | Source: Ambulatory Visit | Attending: Radiation Oncology | Admitting: Radiation Oncology

## 2015-02-17 ENCOUNTER — Inpatient Hospital Stay (HOSPITAL_COMMUNITY): Payer: Medicare HMO

## 2015-02-17 ENCOUNTER — Encounter (HOSPITAL_COMMUNITY): Payer: Self-pay

## 2015-02-17 ENCOUNTER — Institutional Professional Consult (permissible substitution): Payer: Medicare HMO | Admitting: Radiation Oncology

## 2015-02-17 ENCOUNTER — Ambulatory Visit: Payer: Medicare HMO

## 2015-02-17 ENCOUNTER — Inpatient Hospital Stay (HOSPITAL_COMMUNITY)
Admission: EM | Admit: 2015-02-17 | Discharge: 2015-02-19 | DRG: 948 | Disposition: A | Discharge status: Home / Self Care | Payer: Medicare HMO | Attending: Internal Medicine | Admitting: Internal Medicine

## 2015-02-17 DIAGNOSIS — I10 Essential (primary) hypertension: Secondary | ICD-10-CM | POA: Diagnosis present

## 2015-02-17 DIAGNOSIS — J449 Chronic obstructive pulmonary disease, unspecified: Secondary | ICD-10-CM | POA: Diagnosis present

## 2015-02-17 DIAGNOSIS — G893 Neoplasm related pain (acute) (chronic): Secondary | ICD-10-CM | POA: Diagnosis present

## 2015-02-17 DIAGNOSIS — E876 Hypokalemia: Secondary | ICD-10-CM | POA: Diagnosis present

## 2015-02-17 DIAGNOSIS — F319 Bipolar disorder, unspecified: Secondary | ICD-10-CM | POA: Diagnosis present

## 2015-02-17 DIAGNOSIS — C7951 Secondary malignant neoplasm of bone: Secondary | ICD-10-CM | POA: Diagnosis present

## 2015-02-17 DIAGNOSIS — Z902 Acquired absence of lung [part of]: Secondary | ICD-10-CM | POA: Diagnosis present

## 2015-02-17 DIAGNOSIS — R1084 Generalized abdominal pain: Secondary | ICD-10-CM | POA: Diagnosis present

## 2015-02-17 DIAGNOSIS — Z981 Arthrodesis status: Secondary | ICD-10-CM

## 2015-02-17 DIAGNOSIS — C3411 Malignant neoplasm of upper lobe, right bronchus or lung: Secondary | ICD-10-CM | POA: Diagnosis present

## 2015-02-17 DIAGNOSIS — F419 Anxiety disorder, unspecified: Secondary | ICD-10-CM | POA: Diagnosis present

## 2015-02-17 DIAGNOSIS — R1310 Dysphagia, unspecified: Secondary | ICD-10-CM | POA: Diagnosis present

## 2015-02-17 DIAGNOSIS — C3410 Malignant neoplasm of upper lobe, unspecified bronchus or lung: Secondary | ICD-10-CM | POA: Diagnosis present

## 2015-02-17 DIAGNOSIS — K219 Gastro-esophageal reflux disease without esophagitis: Secondary | ICD-10-CM | POA: Diagnosis present

## 2015-02-17 DIAGNOSIS — M1990 Unspecified osteoarthritis, unspecified site: Secondary | ICD-10-CM | POA: Diagnosis present

## 2015-02-17 DIAGNOSIS — R1013 Epigastric pain: Secondary | ICD-10-CM | POA: Diagnosis not present

## 2015-02-17 DIAGNOSIS — G8929 Other chronic pain: Secondary | ICD-10-CM | POA: Diagnosis present

## 2015-02-17 DIAGNOSIS — M549 Dorsalgia, unspecified: Secondary | ICD-10-CM | POA: Diagnosis present

## 2015-02-17 DIAGNOSIS — B1920 Unspecified viral hepatitis C without hepatic coma: Secondary | ICD-10-CM | POA: Diagnosis present

## 2015-02-17 DIAGNOSIS — R52 Pain, unspecified: Secondary | ICD-10-CM | POA: Insufficient documentation

## 2015-02-17 DIAGNOSIS — E119 Type 2 diabetes mellitus without complications: Secondary | ICD-10-CM

## 2015-02-17 DIAGNOSIS — Z79899 Other long term (current) drug therapy: Secondary | ICD-10-CM | POA: Diagnosis not present

## 2015-02-17 DIAGNOSIS — R109 Unspecified abdominal pain: Secondary | ICD-10-CM

## 2015-02-17 DIAGNOSIS — Z87891 Personal history of nicotine dependence: Secondary | ICD-10-CM

## 2015-02-17 LAB — URINALYSIS, ROUTINE W REFLEX MICROSCOPIC
BILIRUBIN URINE: NEGATIVE
Glucose, UA: NEGATIVE mg/dL
Hgb urine dipstick: NEGATIVE
Ketones, ur: 15 mg/dL — AB
Leukocytes, UA: NEGATIVE
Nitrite: NEGATIVE
PROTEIN: NEGATIVE mg/dL
Specific Gravity, Urine: >1.046 — ABNORMAL HIGH (ref 1.005–1.030)
Urobilinogen, UA: 1 mg/dL (ref 0.0–1.0)
pH: 8.5 — ABNORMAL HIGH (ref 5.0–8.0)

## 2015-02-17 LAB — COMPREHENSIVE METABOLIC PANEL
ALK PHOS: 198 U/L — AB (ref 39–117)
ALT: 14 U/L (ref 0–35)
ANION GAP: 14 (ref 5–15)
AST: 26 U/L (ref 0–37)
Albumin: 4.5 g/dL (ref 3.5–5.2)
BILIRUBIN TOTAL: 0.8 mg/dL (ref 0.3–1.2)
BUN: 14 mg/dL (ref 6–23)
CHLORIDE: 97 mmol/L (ref 96–112)
CO2: 27 mmol/L (ref 19–32)
Calcium: 9.7 mg/dL (ref 8.4–10.5)
Creatinine, Ser: 0.65 mg/dL (ref 0.50–1.10)
GFR calc Af Amer: >90 mL/min (ref >90)
Glucose, Bld: 144 mg/dL — ABNORMAL HIGH (ref 70–99)
Potassium: 3 mmol/L — ABNORMAL LOW (ref 3.5–5.1)
Sodium: 138 mmol/L (ref 135–145)
TOTAL PROTEIN: 8 g/dL (ref 6.0–8.3)

## 2015-02-17 LAB — CBC WITH DIFFERENTIAL/PLATELET
BASOS PCT: 0 % (ref 0–1)
Basophils Absolute: 0 10*3/uL (ref 0.0–0.1)
EOS ABS: 0 10*3/uL (ref 0.0–0.7)
Eosinophils Relative: 0 % (ref 0–5)
HEMATOCRIT: 42 % (ref 36.0–46.0)
HEMOGLOBIN: 14.1 g/dL (ref 12.0–15.0)
LYMPHS ABS: 0.9 10*3/uL (ref 0.7–4.0)
Lymphocytes Relative: 12 % (ref 12–46)
MCH: 29.8 pg (ref 26.0–34.0)
MCHC: 33.6 g/dL (ref 30.0–36.0)
MCV: 88.8 fL (ref 78.0–100.0)
MONO ABS: 0.3 10*3/uL (ref 0.1–1.0)
Monocytes Relative: 5 % (ref 3–12)
NEUTROS ABS: 5.8 10*3/uL (ref 1.7–7.7)
Neutrophils Relative %: 83 % — ABNORMAL HIGH (ref 43–77)
Platelets: 229 10*3/uL (ref 150–400)
RBC: 4.73 MIL/uL (ref 3.87–5.11)
RDW: 13.1 % (ref 11.5–15.5)
WBC: 7 10*3/uL (ref 4.0–10.5)

## 2015-02-17 LAB — GLUCOSE, CAPILLARY: Glucose-Capillary: 114 mg/dL — ABNORMAL HIGH (ref 70–99)

## 2015-02-17 LAB — TROPONIN I: TROPONIN I: 0.03 ng/mL (ref <0.031)

## 2015-02-17 LAB — LACTIC ACID, PLASMA
Lactic Acid, Venous: 2.7 mmol/L (ref 0.5–2.0)
Lactic Acid, Venous: 2.2 mmol/L (ref 0.5–2.0)

## 2015-02-17 LAB — LIPASE, BLOOD: LIPASE: 15 U/L (ref 11–59)

## 2015-02-17 MED ORDER — ONDANSETRON HCL 4 MG/2ML IJ SOLN
4.0000 mg | Freq: Once | INTRAMUSCULAR | Status: AC
  Administered 2015-02-17: 4 mg via INTRAVENOUS
  Filled 2015-02-17: qty 2

## 2015-02-17 MED ORDER — HYDROMORPHONE HCL 1 MG/ML IJ SOLN
1.0000 mg | Freq: Once | INTRAMUSCULAR | Status: AC
  Administered 2015-02-17: 1 mg via INTRAVENOUS
  Filled 2015-02-17: qty 1

## 2015-02-17 MED ORDER — POTASSIUM CHLORIDE CRYS ER 20 MEQ PO TBCR
20.0000 meq | EXTENDED_RELEASE_TABLET | Freq: Every day | ORAL | Status: DC
  Administered 2015-02-17 – 2015-02-19 (×3): 20 meq via ORAL
  Filled 2015-02-17 (×3): qty 1

## 2015-02-17 MED ORDER — DICYCLOMINE HCL 20 MG PO TABS
20.0000 mg | ORAL_TABLET | Freq: Three times a day (TID) | ORAL | Status: DC
  Administered 2015-02-17 – 2015-02-19 (×6): 20 mg via ORAL
  Filled 2015-02-17 (×9): qty 1

## 2015-02-17 MED ORDER — BISACODYL 10 MG RE SUPP: 10.0000 mg | Freq: Every day | RECTAL | Status: DC | PRN

## 2015-02-17 MED ORDER — POTASSIUM CHLORIDE IN NACL 20-0.9 MEQ/L-% IV SOLN
INTRAVENOUS | Status: AC
  Administered 2015-02-17 – 2015-02-18 (×2): via INTRAVENOUS
  Filled 2015-02-17 (×2): qty 1000

## 2015-02-17 MED ORDER — PANTOPRAZOLE SODIUM 40 MG PO TBEC: 40.0000 mg | DELAYED_RELEASE_TABLET | Freq: Every day | ORAL | Status: DC

## 2015-02-17 MED ORDER — METOPROLOL TARTRATE 25 MG PO TABS
25.0000 mg | ORAL_TABLET | Freq: Two times a day (BID) | ORAL | Status: DC
  Administered 2015-02-17 – 2015-02-19 (×4): 25 mg via ORAL
  Filled 2015-02-17 (×5): qty 1

## 2015-02-17 MED ORDER — ONDANSETRON HCL 4 MG/2ML IJ SOLN
4.0000 mg | Freq: Four times a day (QID) | INTRAMUSCULAR | Status: DC | PRN
  Administered 2015-02-17 – 2015-02-19 (×4): 4 mg via INTRAVENOUS
  Filled 2015-02-17 (×4): qty 2

## 2015-02-17 MED ORDER — HEPARIN SODIUM (PORCINE) 5000 UNIT/ML IJ SOLN
5000.0000 [IU] | Freq: Three times a day (TID) | INTRAMUSCULAR | Status: DC
  Administered 2015-02-17 – 2015-02-19 (×5): 5000 [IU] via SUBCUTANEOUS
  Filled 2015-02-17 (×8): qty 1

## 2015-02-17 MED ORDER — SODIUM CHLORIDE 0.9 % IV BOLUS (SEPSIS)
1000.0000 mL | Freq: Once | INTRAVENOUS | Status: AC
  Administered 2015-02-17: 1000 mL via INTRAVENOUS

## 2015-02-17 MED ORDER — LORATADINE 10 MG PO TABS
10.0000 mg | ORAL_TABLET | Freq: Every day | ORAL | Status: DC
  Administered 2015-02-17 – 2015-02-19 (×3): 10 mg via ORAL
  Filled 2015-02-17 (×3): qty 1

## 2015-02-17 MED ORDER — VITAMIN D3 25 MCG (1000 UNIT) PO TABS: 1000.0000 [IU] | ORAL_TABLET | ORAL | Status: DC

## 2015-02-17 MED ORDER — SODIUM CHLORIDE 0.9 % IV SOLN
INTRAVENOUS | Status: DC
  Administered 2015-02-17: 12:00:00 via INTRAVENOUS

## 2015-02-17 MED ORDER — INSULIN ASPART 100 UNIT/ML ~~LOC~~ SOLN: 0.0000 [IU] | Freq: Three times a day (TID) | SUBCUTANEOUS | Status: DC

## 2015-02-17 MED ORDER — HYDROMORPHONE HCL 1 MG/ML IJ SOLN
0.5000 mg | INTRAMUSCULAR | Status: DC | PRN
  Administered 2015-02-17 – 2015-02-19 (×12): 1 mg via INTRAVENOUS
  Filled 2015-02-17 (×12): qty 1

## 2015-02-17 MED ORDER — ONDANSETRON HCL 4 MG PO TABS: 4.0000 mg | ORAL_TABLET | Freq: Four times a day (QID) | ORAL | Status: DC | PRN

## 2015-02-17 MED ORDER — IOHEXOL 300 MG/ML  SOLN
100.0000 mL | Freq: Once | INTRAMUSCULAR | Status: AC | PRN
  Administered 2015-02-17: 100 mL via INTRAVENOUS

## 2015-02-17 MED ORDER — IOHEXOL 300 MG/ML  SOLN
25.0000 mL | Freq: Once | INTRAMUSCULAR | Status: AC | PRN
  Administered 2015-02-17: 25 mL via ORAL

## 2015-02-17 MED ORDER — GI COCKTAIL ~~LOC~~
30.0000 mL | Freq: Once | ORAL | Status: AC
  Administered 2015-02-17: 30 mL via ORAL
  Filled 2015-02-17: qty 30

## 2015-02-17 MED ORDER — IPRATROPIUM-ALBUTEROL 20-100 MCG/ACT IN AERS: 2.0000 | INHALATION_SPRAY | Freq: Every day | RESPIRATORY_TRACT | Status: DC | PRN

## 2015-02-17 MED ORDER — GABAPENTIN 400 MG PO CAPS
400.0000 mg | ORAL_CAPSULE | Freq: Three times a day (TID) | ORAL | Status: DC
  Administered 2015-02-17 – 2015-02-19 (×6): 400 mg via ORAL
  Filled 2015-02-17 (×8): qty 1

## 2015-02-17 MED ORDER — PROMETHAZINE HCL 25 MG PO TABS
25.0000 mg | ORAL_TABLET | Freq: Three times a day (TID) | ORAL | Status: DC | PRN
  Administered 2015-02-17: 25 mg via ORAL
  Filled 2015-02-17: qty 1

## 2015-02-17 MED ORDER — ALPRAZOLAM 0.5 MG PO TABS
0.5000 mg | ORAL_TABLET | Freq: Two times a day (BID) | ORAL | Status: DC
  Administered 2015-02-17 – 2015-02-19 (×5): 0.5 mg via ORAL
  Filled 2015-02-17 (×5): qty 1

## 2015-02-17 MED ORDER — PANTOPRAZOLE SODIUM 40 MG IV SOLR
40.0000 mg | Freq: Two times a day (BID) | INTRAVENOUS | Status: DC
  Administered 2015-02-17 – 2015-02-19 (×5): 40 mg via INTRAVENOUS
  Filled 2015-02-17 (×6): qty 40

## 2015-02-17 MED ORDER — TIZANIDINE HCL 4 MG PO TABS
4.0000 mg | ORAL_TABLET | Freq: Three times a day (TID) | ORAL | Status: DC | PRN
  Filled 2015-02-17: qty 1

## 2015-02-17 MED ORDER — SENNOSIDES-DOCUSATE SODIUM 8.6-50 MG PO TABS: 1.0000 | ORAL_TABLET | Freq: Every evening | ORAL | Status: DC | PRN

## 2015-02-17 MED ORDER — IPRATROPIUM-ALBUTEROL 0.5-2.5 (3) MG/3ML IN SOLN: 3.0000 mL | Freq: Every day | RESPIRATORY_TRACT | Status: DC | PRN

## 2015-02-17 MED ORDER — SODIUM CHLORIDE 0.9 % IV SOLN
INTRAVENOUS | Status: DC
  Administered 2015-02-18: 17:00:00 via INTRAVENOUS
  Administered 2015-02-18: 1000 mL via INTRAVENOUS

## 2015-02-17 MED ORDER — MAGNESIUM CITRATE PO SOLN: 1.0000 | Freq: Once | ORAL | Status: AC | PRN

## 2015-02-17 NOTE — ED Notes (Addendum)
Patient unable to answer questions. Escorted by family member who reports abdominal pain has been ongoing "for years" with no real answers.  Patient reports severe generalized abdominal pain this morning.  [name] restless, unable to sit/lie still.  Recent CA diagnosis, per visitor at bedside: It is located in her spine, with no known origin.

## 2015-02-17 NOTE — ED Notes (Signed)
Patient unable to answer questions. [name] unable to sit/lie still in bed.

## 2015-02-17 NOTE — ED Provider Notes (Signed)
CSN: [id number]     Arrival date & time 02/17/15  0617 History   First MD Initiated Contact with Patient 02/17/15 [phone]     Chief Complaint  Patient presents with  . Abdominal Pain  . Emesis     (Consider location/radiation/quality/duration/timing/severity/associated sxs/prior Treatment) [name] a 64 y.o. female presenting with abdominal pain.  Abdominal Pain Pain location:  Generalized Pain quality: cramping   Pain radiates to:  Does not radiate Pain severity:  Moderate Onset quality:  Gradual Timing:  Constant Progression:  Worsening Chronicity:  New Context: sick contacts (grandchildren with gastroenteritis)   Context comment:  Ho cancer, currently with osseous mets Relieved by:  Nothing Worsened by:  Nothing tried Ineffective treatments:  None tried Associated symptoms: anorexia, chills, constipation, nausea and vomiting   Associated symptoms: no cough, no diarrhea, no fever and no melena     Past Medical History  Diagnosis Date  . Hypertension   . Bipolar 1 disorder   . Heart murmur   . Sciatica   . Arthritis   . Anxiety   . Diverticulosis   . Chronic low back pain   . DDD (degenerative disc disease), lumbar   . Chronic shoulder pain   . Chronic neck pain   . Hepatitis B     Unclear when initially diagnosed, labs in Epic from 03/09/13  . Hepatitis C     Unclear when initially diagnosed, labs in Epic from 03/09/13  . C. difficile diarrhea     April and February 2014  . COPD (chronic obstructive pulmonary disease)   . GERD (gastroesophageal reflux disease)   . H/O hiatal hernia   . Headache(784.0)     hx  . PONV (postoperative nausea and vomiting)   . Seizures     on meds  . Diabetes mellitus   . Adenocarcinoma of lung, stage 1    Past Surgical History  Procedure Laterality Date  . Facial tumor removal Right 2000  . Brain tumor removal  09  . Brain surgery      in [place]  . Video bronchoscopy  12/04/2012    Procedure: VIDEO BRONCHOSCOPY;   Surgeon: Grace Isaac, MD;  Location: [facility] OR;  Service: Thoracic;  Laterality: N/A;  . Video assisted thoracoscopy (vats)/wedge resection  12/04/2012    Procedure: VIDEO ASSISTED THORACOSCOPY (VATS)/WEDGE RESECTION;  Surgeon: Grace Isaac, MD;  Location: [facility];  Service: Thoracic;  Laterality: Right;  . Lobectomy  12/04/2012    Procedure: LOBECTOMY;  Surgeon: Grace Isaac, MD;  Location: [facility];  Service: Thoracic;  Laterality: Right;  completion of right upper lobectomy and lymph node disection, placement of on q pump  . Tubal ligation    . Tonsillectomy    . Anterior cervical decomp/discectomy fusion N/A 03/05/2014    Procedure: ANTERIOR CERVICAL DECOMPRESSION/DISCECTOMY FUSION 2 LEVELS;  Surgeon: Sinclair Ship, MD;  Location: [facility];  Service: Orthopedics;  Laterality: N/A;  Anterior cervical decompression fusion, cervical 4-5, cervical 5-6 with instrumentation, allograft.   Family History  Problem Relation Age of Onset  . Hypertension Father     deceased  . Diabetes Father   . Heart disease Father   . Hyperlipidemia Father   . Hypertension Mother   . Hyperlipidemia Mother   . Diabetes Mother   . Hypertension Sister   . Hyperlipidemia Sister   . Hypertension Brother   . Hyperlipidemia Brother    History  Substance Use Topics  . Smoking status: Former Smoker --  0.25 packs/day for 10 years    Types: Cigarettes    Quit date: 07/07/2013  . Smokeless tobacco: Never Used     Comment: Smokes pk q 3 days   marajuna aug  . Alcohol Use: No     Comment: quit 77yrs ago   OB History    No data available     Review of Systems  Constitutional: Positive for chills. Negative for fever.  Respiratory: Negative for cough.   Gastrointestinal: Positive for nausea, vomiting, abdominal pain, constipation and anorexia. Negative for diarrhea and melena.  All other systems reviewed and are negative.     Allergies  Haldol; Acetaminophen; Ibuprofen; Metronidazole; Morphine and  related; and Penicillins  Home Medications   Prior to Admission medications   Medication Sig Start Date End Date Taking? Authorizing Provider  ALPRAZolam Duanne Moron) 1 MG tablet Take 0.5 mg by mouth 2 (two) times daily.    Yes Historical Provider, MD  Cholecalciferol (VITAMIN D PO) Take 1 tablet by mouth once a week. monday   Yes Historical Provider, MD  Cyanocobalamin (VITAMIN B-12 IJ) Inject as directed every 30 (thirty) days. Administered by Dr. York Ram around the 1st of each month   Yes Historical Provider, MD  dicyclomine (BENTYL) 20 MG tablet Take 20 mg by mouth 3 (three) times daily with meals.   Yes Historical Provider, MD  gabapentin (NEURONTIN) 400 MG capsule Take 400 mg by mouth 3 (three) times daily. Nerve pain   Yes Historical Provider, MD  Ipratropium-Albuterol (COMBIVENT RESPIMAT) 20-100 MCG/ACT AERS respimat Inhale 2 puffs into the lungs daily as needed for wheezing or shortness of breath.    Yes Historical Provider, MD  KLOR-CON M20 20 MEQ tablet Take 20 mEq by mouth daily. with food 01/04/15  Yes Historical Provider, MD  loratadine (CLARITIN) 10 MG tablet Take 10 mg by mouth daily.    Yes Historical Provider, MD  Menthol-Methyl Salicylate (MUSCLE RUB) 10-15 % CREA Apply 1 application topically daily as needed for muscle pain. Left shoulder pain   Yes Historical Provider, MD  metFORMIN (GLUCOPHAGE) 500 MG tablet Take 500 mg by mouth 2 (two) times daily with a meal.   Yes Historical Provider, MD  metoprolol tartrate (LOPRESSOR) 25 MG tablet Take 25 mg by mouth 2 (two) times daily.  11/24/13  Yes Historical Provider, MD  olmesartan-hydrochlorothiazide (BENICAR HCT) 40-25 MG per tablet Take 1 tablet by mouth daily.   Yes Historical Provider, MD  omeprazole (PRILOSEC) 20 MG capsule Take 20 mg by mouth 2 (two) times daily before a meal.  01/16/14  Yes Historical Provider, MD  ondansetron (ZOFRAN) 8 MG tablet Take 1 tablet by mouth daily as needed for nausea or vomiting.  12/21/14  Yes  Historical Provider, MD  OXcarbazepine (TRILEPTAL) 150 MG tablet Take 150 mg by mouth 3 (three) times daily.  11/24/13  Yes Historical Provider, MD  Oxycodone HCl 10 MG TABS Take 10 mg by mouth 2 (two) times daily as needed. for pain 02/06/15  Yes Historical Provider, MD  promethazine (PHENERGAN) 25 MG tablet Take 1 tablet (25 mg total) by mouth every 8 (eight) hours as needed for nausea or vomiting. 12/05/14  Yes Christopher Lawyer, PA-C  simvastatin (ZOCOR) 20 MG tablet Take 20 mg by mouth at bedtime.    Yes Historical Provider, MD  tiZANidine (ZANAFLEX) 4 MG tablet Take 4 mg by mouth every 8 (eight) hours as needed for muscle spasms.   Yes Historical Provider, MD  triamcinolone cream (KENALOG) 0.1 %  Apply 1 application topically 2 (two) times daily as needed (for rash).  11/30/14  Yes Historical Provider, MD  senna-docusate (SENOKOT-S) 8.6-50 MG per tablet Take 1 tablet by mouth at bedtime as needed for mild constipation. 02/18/15   Silver Huguenin Elgergawy, MD   BP 147/82 mmHg  Pulse 80  Temp(Src) 98.6 F (37 C) (Oral)  Resp 20  Ht 5\' 4"  (1.626 m)  Wt 138 lb 0.1 oz (62.6 kg)  BMI 23.68 kg/m2  SpO2 100% Physical Exam  Constitutional: She is oriented to person, place, and time. She appears well-developed and well-nourished.  HENT:  Head: Normocephalic and atraumatic.  Right Ear: External ear normal.  Left Ear: External ear normal.  Eyes: Conjunctivae and EOM are normal. Pupils are equal, round, and reactive to light.  Neck: Normal range of motion. Neck supple.  Cardiovascular: Normal rate, regular rhythm, normal heart sounds and intact distal pulses.   Pulmonary/Chest: Effort normal and breath sounds normal.  Abdominal: Soft. Bowel sounds are normal. There is generalized tenderness (mild).  Musculoskeletal: Normal range of motion.  Neurological: She is alert and oriented to person, place, and time.  Skin: Skin is warm and dry.  Vitals reviewed.   ED Course  Procedures (including critical  care time) Labs Review Labs Reviewed  COMPREHENSIVE METABOLIC PANEL - Abnormal; Notable for the following:    Potassium 3.0 (*)    Glucose, Bld 144 (*)    Alkaline Phosphatase 198 (*)    All other components within normal limits  CBC WITH DIFFERENTIAL/PLATELET - Abnormal; Notable for the following:    Neutrophils Relative % 83 (*)    All other components within normal limits  URINALYSIS, ROUTINE W REFLEX MICROSCOPIC - Abnormal; Notable for the following:    Specific Gravity, Urine >1.046 (*)    pH 8.5 (*)    Ketones, ur 15 (*)    All other components within normal limits  LACTIC ACID, PLASMA - Abnormal; Notable for the following:    Lactic Acid, Venous 2.7 (*)    All other components within normal limits  LACTIC ACID, PLASMA - Abnormal; Notable for the following:    Lactic Acid, Venous 2.2 (*)    All other components within normal limits  GLUCOSE, CAPILLARY - Abnormal; Notable for the following:    Glucose-Capillary 114 (*)    All other components within normal limits  GLUCOSE, CAPILLARY - Abnormal; Notable for the following:    Glucose-Capillary 127 (*)    All other components within normal limits  URINE CULTURE  LIPASE, BLOOD  TROPONIN I  BASIC METABOLIC PANEL  CBC  GLUCOSE, CAPILLARY    Imaging Review Dg Chest 2 View  02/17/2015   CLINICAL DATA:  Abdominal pain and emesis  EXAM: CHEST  2 VIEW  COMPARISON:  03/05/2014  FINDINGS: Normal heart size and stable aortic and hilar contours. There is unchanged distortion of right hilar architecture related to upper lobectomy. There is no edema, consolidation, effusion, or pneumothorax. Mild thoracic dextroscoliosis. No acute osseous findings.  IMPRESSION: 1. No active cardiopulmonary disease. 2. Right upper lobectomy.   Electronically Signed   By: Monte Fantasia M.D.   On: 02/17/2015 07:17   Ct Abdomen Pelvis W Contrast  02/17/2015   CLINICAL DATA:  Acute abdominal pain.  History lung cancer  EXAM: CT ABDOMEN AND PELVIS WITH  CONTRAST  TECHNIQUE: Multidetector CT imaging of the abdomen and pelvis was performed using the standard protocol following bolus administration of intravenous contrast.  CONTRAST:  185mL OMNIPAQUE  IOHEXOL 300 MG/ML  SOLN  COMPARISON:  CT abdomen 12/05/2014  FINDINGS: Lung bases are clear.  Hiatal hernia noted.  Normal liver. No liver mass lesion. Gallbladder normal. Common bile duct measures approximately 11 mm in diameter and is mildly enlarged. Correlate with liver function tests. Common bile duct slightly more prominent compared with the prior CT 12/05/2014. Pancreatic duct nondilated. No pancreatic mass. Negative for pancreatitis. Normal spleen.  Normal kidneys. No renal mass or obstruction. No renal calculi. Urinary bladder normal. Normal uterus which is retroverted.  Negative for bowel obstruction. No bowel thickening. Normal appendix.  Negative for free fluid.  No adenopathy identified.  Widespread bony lesions compatible with metastatic disease. These lesions are mostly sclerotic but there are some lesions which are lytic including the left posterior iliac wing. Bone lesions show rapid progression since the CT of 12/05/2014. Similar appearance on the MRI of 02/02/2015. Given history of stage I lung cancer, metastatic lung cancer is possible however also consider metastatic breast cancer. No pathologic fracture. Prior surgical fusion L5-S1 with pseudarthrosis and grade 1 anterior slip.  IMPRESSION: Common bile duct dilated 11 mm. Common duct stone not excluded. No liver lesions  Widespread bony metastatic disease, with rapid progression since 12/05/2014. Given the sclerotic appearance of these lesions, consider breast cancer is well as metastatic lung cancer.   Electronically Signed   By: Franchot Gallo M.D.   On: 02/17/2015 10:15   US Abdomen Limited Ruq  02/17/2015   CLINICAL DATA:  Generalized abdominal pain  EXAM: US ABDOMEN LIMITED - RIGHT UPPER QUADRANT  COMPARISON:  None.  FINDINGS: Gallbladder:   No gallstones or wall thickening visualized. No sonographic Murphy sign noted.  Common bile duct:  Diameter: 5.2 mm  Liver:  No focal lesion identified. Within normal limits in parenchymal echogenicity.  IMPRESSION: Normal   Electronically Signed   By: Franchot Gallo M.D.   On: 02/17/2015 14:20     EKG Interpretation None      MDM   Final diagnoses:  Abdominal pain, acute    64 y.o. female with pertinent PMH of osseous mets of spine presents with abd pain, n/v, constipation.  Pt has had pain chronically, states it is worse over the last 24 hours.  Physical exam as above.    Wu as above.  Diffuse progression of mets.  Consulted medicine for admission given refractory pain.  I have reviewed all laboratory and imaging studies if ordered as above  1. Malignant neoplasm of upper lobe of right lung   2. Abdominal pain, acute   3. Abdominal pain, generalized   4. Dysphagia         Debby Freiberg, MD 02/18/15 1011

## 2015-02-17 NOTE — H&P (Addendum)
Patient Demographics  [name] [name], is a 64 y.o. female  MRN: [id number]   DOB - [birth year]  Admit Date - 02/17/2015  Outpatient Primary MD for the patient is Harvie Junior, MD   With History of -  Past Medical History  Diagnosis Date  . Hypertension   . Bipolar 1 disorder   . Heart murmur   . Sciatica   . Arthritis   . Anxiety   . Diverticulosis   . Chronic low back pain   . DDD (degenerative disc disease), lumbar   . Chronic shoulder pain   . Chronic neck pain   . Hepatitis B     Unclear when initially diagnosed, labs in Epic from 03/09/13  . Hepatitis C     Unclear when initially diagnosed, labs in Epic from 03/09/13  . C. difficile diarrhea     April and February 2014  . COPD (chronic obstructive pulmonary disease)   . GERD (gastroesophageal reflux disease)   . H/O hiatal hernia   . Headache(784.0)     hx  . PONV (postoperative nausea and vomiting)   . Seizures     on meds  . Diabetes mellitus   . Adenocarcinoma of lung, stage 1       Past Surgical History  Procedure Laterality Date  . Facial tumor removal Right 2000  . Brain tumor removal  09  . Brain surgery      in [place]  . Video bronchoscopy  12/04/2012    Procedure: VIDEO BRONCHOSCOPY;  Surgeon: Grace Isaac, MD;  Location: [facility] OR;  Service: Thoracic;  Laterality: N/A;  . Video assisted thoracoscopy (vats)/wedge resection  12/04/2012    Procedure: VIDEO ASSISTED THORACOSCOPY (VATS)/WEDGE RESECTION;  Surgeon: Grace Isaac, MD;  Location: [place];  Service: Thoracic;  Laterality: Right;  . Lobectomy  12/04/2012    Procedure: LOBECTOMY;  Surgeon: Grace Isaac, MD;  Location: [place];  Service: Thoracic;  Laterality: Right;  completion of right upper lobectomy and lymph node disection, placement of on q pump  . Tubal ligation    . Tonsillectomy    . Anterior cervical decomp/discectomy fusion N/A 03/05/2014    Procedure: ANTERIOR CERVICAL DECOMPRESSION/DISCECTOMY FUSION 2 LEVELS;   Surgeon: Sinclair Ship, MD;  Location: [place];  Service: Orthopedics;  Laterality: N/A;  Anterior cervical decompression fusion, cervical 4-5, cervical 5-6 with instrumentation, allograft.    in for   Chief Complaint  Patient presents with  . Abdominal Pain  . Emesis     HPI  [name] [name]  is a 64 y.o. female, with past medical history of hypertension, diabetes mellitus, hepatitis C virus, hepatitis C virus, lung adenocarcinoma, status post right upper lobectomy, recent diagnosis of bone metastasis ,bipolar disorder, anxiety, IBS and hx of diverticulitis and c diff colitis, presents with complaints of abdominal pain, patient reports she she has chronic abdominal pain, IBS, but the pain worsened over the last 24 hours, CT abdomen and pelvis was significant for 11 mm common bile duct, no evidence of gallstones, patient required IV Dilaudid 2 in ED, so hospitalist requested to admit for pain management, no leukocytosis, no fever, patient denies any diarrhea, ports constipation, nausea, and mild vomiting - Patient was recently diagnosed with bone metastatic disease, followed with oncology as an outpatient, supposed to have a follow-up appointment today at 1:30, bone metastasis was thought to be secondary to lung cancer.    Review of Systems    In addition to  the HPI above,  No Fever-chills, No Headache, No changes with Vision or hearing, No problems swallowing food or Liquids, No Chest pain, Cough or Shortness of Breath, Planes of Abdominal pain, mild Nausea and Vommitting, ports poor appetite and constipation No Blood in stool or Urine, No dysuria, No new skin rashes or bruises, No new joints pains-aches,  No new weakness, tingling, numbness in any extremity, planes of back pain, denies any documented weakness, or urinary or stool incontinence. No recent weight gain or loss, No polyuria, polydypsia or polyphagia, No significant Mental Stressors.  A full 10 point Review of  Systems was done, except as stated above, all other Review of Systems were negative.   Social History History  Substance Use Topics  . Smoking status: Former Smoker -- 0.25 packs/day for 10 years    Types: Cigarettes    Quit date: 07/07/2013  . Smokeless tobacco: Never Used     Comment: Smokes pk q 3 days   marajuna aug  . Alcohol Use: No     Comment: quit 107yrs ago     Family History Family History  Problem Relation Age of Onset  . Hypertension Father     deceased  . Diabetes Father   . Heart disease Father   . Hyperlipidemia Father   . Hypertension Mother   . Hyperlipidemia Mother   . Diabetes Mother   . Hypertension Sister   . Hyperlipidemia Sister   . Hypertension Brother   . Hyperlipidemia Brother      Prior to Admission medications   Medication Sig Start Date End Date Taking? Authorizing Provider  ALPRAZolam Duanne Moron) 1 MG tablet Take 0.5 mg by mouth 2 (two) times daily.    Yes Historical Provider, MD  Cholecalciferol (VITAMIN D PO) Take 1 tablet by mouth once a week. monday   Yes Historical Provider, MD  Cyanocobalamin (VITAMIN B-12 IJ) Inject as directed every 30 (thirty) days. Administered by Dr. York Ram around the 1st of each month   Yes Historical Provider, MD  dicyclomine (BENTYL) 20 MG tablet Take 20 mg by mouth 3 (three) times daily with meals.   Yes Historical Provider, MD  gabapentin (NEURONTIN) 400 MG capsule Take 400 mg by mouth 3 (three) times daily. Nerve pain   Yes Historical Provider, MD  Ipratropium-Albuterol (COMBIVENT RESPIMAT) 20-100 MCG/ACT AERS respimat Inhale 2 puffs into the lungs daily as needed for wheezing or shortness of breath.    Yes Historical Provider, MD  KLOR-CON M20 20 MEQ tablet Take 20 mEq by mouth daily. with food 01/04/15  Yes Historical Provider, MD  loratadine (CLARITIN) 10 MG tablet Take 10 mg by mouth daily.    Yes Historical Provider, MD  Menthol-Methyl Salicylate (MUSCLE RUB) 10-15 % CREA Apply 1 application topically  daily as needed for muscle pain. Left shoulder pain   Yes Historical Provider, MD  metFORMIN (GLUCOPHAGE) 500 MG tablet Take 500 mg by mouth 2 (two) times daily with a meal.   Yes Historical Provider, MD  metoprolol tartrate (LOPRESSOR) 25 MG tablet Take 25 mg by mouth 2 (two) times daily.  11/24/13  Yes Historical Provider, MD  olmesartan-hydrochlorothiazide (BENICAR HCT) 40-25 MG per tablet Take 1 tablet by mouth daily.   Yes Historical Provider, MD  omeprazole (PRILOSEC) 20 MG capsule Take 20 mg by mouth 2 (two) times daily before a meal.  01/16/14  Yes Historical Provider, MD  ondansetron (ZOFRAN) 8 MG tablet Take 1 tablet by mouth daily as needed for nausea or vomiting.  12/21/14  Yes Historical Provider, MD  OXcarbazepine (TRILEPTAL) 150 MG tablet Take 150 mg by mouth 3 (three) times daily.  11/24/13  Yes Historical Provider, MD  Oxycodone HCl 10 MG TABS Take 10 mg by mouth 2 (two) times daily as needed. for pain 02/06/15  Yes Historical Provider, MD  promethazine (PHENERGAN) 25 MG tablet Take 1 tablet (25 mg total) by mouth every 8 (eight) hours as needed for nausea or vomiting. 12/05/14  Yes Christopher Lawyer, PA-C  simvastatin (ZOCOR) 20 MG tablet Take 20 mg by mouth at bedtime.    Yes Historical Provider, MD  tiZANidine (ZANAFLEX) 4 MG tablet Take 4 mg by mouth every 8 (eight) hours as needed for muscle spasms.   Yes Historical Provider, MD  triamcinolone cream (KENALOG) 0.1 % Apply 1 application topically 2 (two) times daily as needed (for rash).  11/30/14  Yes Historical Provider, MD    Allergies  Allergen Reactions  . Haldol [Haloperidol Decanoate] Other (See Comments)    tongue swelling  . Acetaminophen Other (See Comments)    Stomach pain  . Ibuprofen Other (See Comments)    Stomach pain  . Metronidazole Other (See Comments)    Severe stomach upset--was instructed to avoid Flagyl.  . Morphine And Related Hives    hives  . Penicillins Rash    Physical Exam  Vitals  Blood  pressure 130/105, pulse 95, temperature 98.8 F (37.1 C), temperature source Oral, resp. rate 13, SpO2 92 %.   1. General : Female lying in bed in mild distress secondary to abdominal pain.  2. Normal affect and insight, Not Suicidal or Homicidal, Awake Alert, Oriented X 3.  3. No F.N deficits, ALL C.Nerves Intact, Strength 5/5 all 4 extremities, Sensation intact all 4 extremities, Plantars down going.  4. Ears and Eyes appear Normal, Conjunctivae clear, PERRLA. Moist Oral Mucosa.  5. Supple Neck, No JVD, No cervical lymphadenopathy appriciated, No Carotid Bruits.  6. Symmetrical Chest wall movement, Good air movement bilaterally, CTAB.  7. RRR, No Gallops, Rubs or Murmurs, No Parasternal Heave.  8. Positive Bowel Sounds, Abdomen Soft, mild epigastric tenderness, No organomegaly appriciated,No rebound -guarding or rigidity.  9.  No Cyanosis, Normal Skin Turgor, No Skin Rash or Bruise.  10. Good muscle tone,  joints appear normal , no effusions, Normal ROM.      Data Review  CBC  Recent Labs Lab 02/11/15 1610 02/17/15 0749  WBC 7.5 7.0  HGB 12.6 14.1  HCT 39.1 42.0  PLT 246 229  MCV 89.2 88.8  MCH 28.7 29.8  MCHC 32.2 33.6  RDW 13.5 13.1  LYMPHSABS 2.1 0.9  MONOABS 0.5 0.3  EOSABS 0.1 0.0  BASOSABS 0.1 0.0   ------------------------------------------------------------------------------------------------------------------  Chemistries   Recent Labs Lab 02/17/15 0749  NA 138  K 3.0*  CL 97  CO2 27  GLUCOSE 144*  BUN 14  CREATININE 0.65  CALCIUM 9.7  AST 26  ALT 14  ALKPHOS 198*  BILITOT 0.8   ------------------------------------------------------------------------------------------------------------------ estimated creatinine clearance is 59.7 mL/min (by C-G formula based on Cr of 0.65). ------------------------------------------------------------------------------------------------------------------ No results for input(s): TSH, T4TOTAL, T3FREE,  THYROIDAB in the last 72 hours.  Invalid input(s): FREET3   Coagulation profile  Recent Labs Lab 02/11/15 1610  INR 1.15   ------------------------------------------------------------------------------------------------------------------- No results for input(s): DDIMER in the last 72 hours. -------------------------------------------------------------------------------------------------------------------  Cardiac Enzymes No results for input(s): CKMB, TROPONINI, MYOGLOBIN in the last 168 hours.  Invalid input(s): CK ------------------------------------------------------------------------------------------------------------------ Invalid input(s): POCBNP   ---------------------------------------------------------------------------------------------------------------  Urinalysis    Component Value Date/Time   COLORURINE YELLOW 02/17/2015 1043   APPEARANCEUR CLEAR 02/17/2015 1043   LABSPEC >1.046* 02/17/2015 1043   PHURINE 8.5* 02/17/2015 1043   GLUCOSEU NEGATIVE 02/17/2015 1043   HGBUR NEGATIVE 02/17/2015 1043   BILIRUBINUR NEGATIVE 02/17/2015 1043   KETONESUR 15* 02/17/2015 1043   PROTEINUR NEGATIVE 02/17/2015 1043   UROBILINOGEN 1.0 02/17/2015 1043   NITRITE NEGATIVE 02/17/2015 1043   LEUKOCYTESUR NEGATIVE 02/17/2015 1043    ----------------------------------------------------------------------------------------------------------------  Imaging results:   Dg Chest 2 View  02/17/2015   CLINICAL DATA:  Abdominal pain and emesis  EXAM: CHEST  2 VIEW  COMPARISON:  03/05/2014  FINDINGS: Normal heart size and stable aortic and hilar contours. There is unchanged distortion of right hilar architecture related to upper lobectomy. There is no edema, consolidation, effusion, or pneumothorax. Mild thoracic dextroscoliosis. No acute osseous findings.  IMPRESSION: 1. No active cardiopulmonary disease. 2. Right upper lobectomy.   Electronically Signed   By: Monte Fantasia M.D.    On: 02/17/2015 07:17   Ct Abdomen Pelvis W Contrast  02/17/2015   CLINICAL DATA:  Acute abdominal pain.  History lung cancer  EXAM: CT ABDOMEN AND PELVIS WITH CONTRAST  TECHNIQUE: Multidetector CT imaging of the abdomen and pelvis was performed using the standard protocol following bolus administration of intravenous contrast.  CONTRAST:  177mL OMNIPAQUE IOHEXOL 300 MG/ML  SOLN  COMPARISON:  CT abdomen 12/05/2014  FINDINGS: Lung bases are clear.  Hiatal hernia noted.  Normal liver. No liver mass lesion. Gallbladder normal. Common bile duct measures approximately 11 mm in diameter and is mildly enlarged. Correlate with liver function tests. Common bile duct slightly more prominent compared with the prior CT 12/05/2014. Pancreatic duct nondilated. No pancreatic mass. Negative for pancreatitis. Normal spleen.  Normal kidneys. No renal mass or obstruction. No renal calculi. Urinary bladder normal. Normal uterus which is retroverted.  Negative for bowel obstruction. No bowel thickening. Normal appendix.  Negative for free fluid.  No adenopathy identified.  Widespread bony lesions compatible with metastatic disease. These lesions are mostly sclerotic but there are some lesions which are lytic including the left posterior iliac wing. Bone lesions show rapid progression since the CT of 12/05/2014. Similar appearance on the MRI of 02/02/2015. Given history of stage I lung cancer, metastatic lung cancer is possible however also consider metastatic breast cancer. No pathologic fracture. Prior surgical fusion L5-S1 with pseudarthrosis and grade 1 anterior slip.  IMPRESSION: Common bile duct dilated 11 mm. Common duct stone not excluded. No liver lesions  Widespread bony metastatic disease, with rapid progression since 12/05/2014. Given the sclerotic appearance of these lesions, consider breast cancer is well as metastatic lung cancer.   Electronically Signed   By: Franchot Gallo M.D.   On: 02/17/2015 10:15         Assessment & Plan  Principal Problem:   Abdominal pain, epigastric Active Problems:   Lung cancer, right upper lobe   Hypertension   Diabetes mellitus   Osseous metastasis    Abdominal pain - Patient has history of chronic abdominal pain, IBS, worsened over the last 24 hours, a febrile, no leukocytosis, mild tenderness in epigastric area on physical exam, no rebound no guarding. - Unclear etiology, will check right upper quadrant ultrasound, will start patient on IV Protonix twice a day for possible gastritis, a day with when necessary pain medicine, nausea medicine, will give her GI cocktail. - We'll start on clear liquid diet after right upper quadrant ultrasound. -  Check EKG and troponin.  Hypertension - As you more medication  Hypokalemia - Likely related to her nausea and vomiting, will replace, recheck in a.m.  Osseous metastasis - [name] been followed by Dr. Annamaria Boots as an outpatient, with known history of lung cancer in the past. - Continue with pain management.  Diabetes mellitus - We'll hold metformin, start an insulin sliding scale.   DVT Prophylaxis Heparin - AM Labs Ordered, also please review Full Orders  Family Communication: Admission, patients condition and plan of care including tests being ordered have been discussed with the patient  who indicate understanding and agree with the plan and Code Status.  Code Status Full  Likely DC to  Home when stable  Condition GUARDED    Time spent in minutes : 55 minutes    Gagandeep Pettet M.D on 02/17/2015 at 12:42 PM  Between 7am to 7pm - Pager - [phone]  After 7pm go to [email or web address] - password [email or web address]  And look for the night coverage person covering me after hours  [facility] Office  [phone]   **Disclaimer: This note may have been dictated with voice recognition software. Similar sounding words can inadvertently be transcribed and this note may contain  transcription errors which may not have been corrected upon publication of note.**

## 2015-02-17 NOTE — ED Notes (Signed)
Pt is unable to give a urine specimen at this time.

## 2015-02-17 NOTE — ED Notes (Signed)
Report given to RN on 3W. All questions answered.

## 2015-02-17 NOTE — ED Notes (Signed)
Attempted report x1. Receiving RN hanging chemo at the moment.

## 2015-02-17 NOTE — ED Notes (Signed)
Patient transported to CT 

## 2015-02-18 ENCOUNTER — Inpatient Hospital Stay (HOSPITAL_COMMUNITY): Payer: Medicare HMO

## 2015-02-18 LAB — BASIC METABOLIC PANEL
Anion gap: 9 (ref 5–15)
BUN: 12 mg/dL (ref 6–23)
CO2: 26 mmol/L (ref 19–32)
Calcium: 8.8 mg/dL (ref 8.4–10.5)
Chloride: 107 mmol/L (ref 96–112)
Creatinine, Ser: 0.57 mg/dL (ref 0.50–1.10)
Glucose, Bld: 87 mg/dL (ref 70–99)
POTASSIUM: 3.7 mmol/L (ref 3.5–5.1)
Sodium: 142 mmol/L (ref 135–145)

## 2015-02-18 LAB — GLUCOSE, CAPILLARY
GLUCOSE-CAPILLARY: 96 mg/dL (ref 70–99)
GLUCOSE-CAPILLARY: 87 mg/dL (ref 70–99)
GLUCOSE-CAPILLARY: 81 mg/dL (ref 70–99)
Glucose-Capillary: 127 mg/dL — ABNORMAL HIGH (ref 70–99)
Glucose-Capillary: 113 mg/dL — ABNORMAL HIGH (ref 70–99)

## 2015-02-18 LAB — CBC
HCT: 39.6 % (ref 36.0–46.0)
HEMOGLOBIN: 13 g/dL (ref 12.0–15.0)
MCH: 29.6 pg (ref 26.0–34.0)
MCHC: 32.8 g/dL (ref 30.0–36.0)
MCV: 90.2 fL (ref 78.0–100.0)
PLATELETS: 206 10*3/uL (ref 150–400)
RBC: 4.39 MIL/uL (ref 3.87–5.11)
RDW: 13.4 % (ref 11.5–15.5)
WBC: 7.9 10*3/uL (ref 4.0–10.5)

## 2015-02-18 LAB — URINE CULTURE
Colony Count: NO GROWTH
Culture: NO GROWTH

## 2015-02-18 MED ORDER — OXCARBAZEPINE 150 MG PO TABS
150.0000 mg | ORAL_TABLET | Freq: Three times a day (TID) | ORAL | Status: DC
  Administered 2015-02-18 – 2015-02-19 (×4): 150 mg via ORAL
  Filled 2015-02-18 (×8): qty 1

## 2015-02-18 MED ORDER — SENNOSIDES-DOCUSATE SODIUM 8.6-50 MG PO TABS: 1.0000 | ORAL_TABLET | Freq: Every evening | ORAL | Status: DC | PRN

## 2015-02-18 NOTE — Progress Notes (Signed)
SCDs on order from portable equipment. None available during the night per [name] [name].

## 2015-02-18 NOTE — Plan of Care (Signed)
Problem: Phase I Progression Outcomes Goal: Pain controlled with appropriate interventions Outcome: Progressing Pain medications do lower pain

## 2015-02-18 NOTE — Progress Notes (Signed)
Paged NP on call about antiseizure med not being reordered on admission and about activity since previous order expired.

## 2015-02-18 NOTE — Plan of Care (Addendum)
Problem: Phase I Progression Outcomes Goal: OOB as tolerated unless otherwise ordered Outcome: Completed/Met Date Met:  02/18/15 oob with assist to [facility]

## 2015-02-18 NOTE — Progress Notes (Signed)
Patient Demographics  [name] [name], is a 64 y.o. female, DOB - Jun 10, 1951, YTK:[id number]  Admit date - 02/17/2015   Admitting Physician Albertine Patricia, MD  Outpatient Primary MD for the patient is Harvie Junior, MD  LOS - 1   Chief Complaint  Patient presents with  . Abdominal Pain  . Emesis      Admission history of present illness/brief narrative: [name] [name] is a 64 y.o. female, with past medical history of hypertension, diabetes mellitus, hepatitis C virus, hepatitis C virus, lung adenocarcinoma, status post right upper lobectomy, recent diagnosis of bone metastasis ,bipolar disorder, anxiety, IBS and hx of diverticulitis and c diff colitis, presents with complaints of abdominal pain, patient reports she she has chronic abdominal pain, IBS, but the pain worsened over the last 24 hours, CT abdomen and pelvis was significant for 11 mm common bile duct, no evidence of gallstones, right upper quadrant ultrasound without acute findings , patient required IV Dilaudid 2 in ED, so hospitalist requested to admit for pain management, no leukocytosis, no fever, patient denies any diarrhea, ports constipation, nausea, and mild vomiting - Patient was recently diagnosed with bone metastatic disease, followed with oncology as an outpatient, bone metastasis was thought to be secondary to lung cancer. Patient nausea vomiting, significantly improved, no acute finding in imaging, and has been complaining of mild intermittent dysphagia which has been chronic for years, dysphagia gram was done, showing motility disorder.     Subjective:   [name] [name] today has, No headache, No chest pain, No abdominal pain - No Nausea, No new weakness tingling or numbness, No Cough - SOB. Reports dysphagial  Assessment & Plan    Principal Problem:   Abdominal pain,  epigastric Active Problems:   Lung cancer, right upper lobe   Hypertension   Diabetes mellitus   Osseous metastasis  Abdominal pain - Patient has history of chronic abdominal pain, IBS, a febrile, no leukocytosis,  - Unclear etiology, no acute findings in CT abdomen and pelvis, right upper quadrant ultrasound to explain this pain. Most likely secondary to her bone metastasis pain.  - on IV Protonix twice a day for possible gastritis, a day with when necessary pain medicine, nausea medicine.  - Patient almost resolved, no further nausea or vomiting   Dysphagia - ESOPHOGRAM/BARIUM SWALLOW done 3/31, showing no structural abnormality, showing motility disorder , will change patient to mechanical soft diet, will consult speech therapy for education , will need to follow with GI as an outpatient for possible need for endoscopy .  Hypertension - Continue with home medication  Hypokalemia - Repleted  Osseous metastasis - [name] been followed by Dr. Annamaria Boots as an outpatient, with known history of lung cancer in the past. - Continue with pain management.  Diabetes mellitus - We'll hold metformin, start an insulin sliding scale  Code Status: Full  Family Communication: None at bedside   Disposition Plan: Home when stable   Procedures  None   Consults   none   Medications  Scheduled Meds: . ALPRAZolam  0.5 mg Oral BID  . [START ON 02/22/2015] cholecalciferol  1,000 Units Oral Weekly  . dicyclomine  20 mg Oral TID WC  . gabapentin  400 mg Oral TID  .  heparin  5,000 Units Subcutaneous 3 times per day  . insulin aspart  0-9 Units Subcutaneous TID WC  . loratadine  10 mg Oral Daily  . metoprolol tartrate  25 mg Oral BID  . OXcarbazepine  150 mg Oral 3 times per day  . pantoprazole (PROTONIX) IV  40 mg Intravenous Q12H  . potassium chloride SA  20 mEq Oral Daily   Continuous Infusions: . sodium chloride     PRN Meds:.bisacodyl, HYDROmorphone (DILAUDID) injection,  ipratropium-albuterol, ondansetron **OR** ondansetron (ZOFRAN) IV, promethazine, senna-docusate, tiZANidine  DVT Prophylaxis   Heparin -   Lab Results  Component Value Date   PLT 206 02/18/2015    Antibiotics    Anti-infectives    None          Objective:   Filed Vitals:   02/17/15 2158 02/17/15 2358 02/18/15 0515 02/18/15 1400  BP: 146/75  147/82 152/78  Pulse: 85 86 80 76  Temp: 98.1 F (36.7 C)  98.6 F (37 C) 98.7 F (37.1 C)  TempSrc: Oral  Oral Oral  Resp: 20  20 20   Height:      Weight:      SpO2: 96%  100% 100%    Wt Readings from Last 3 Encounters:  02/17/15 62.6 kg (138 lb 0.1 oz)  02/11/15 63.322 kg (139 lb 9.6 oz)  11/27/14 66.679 kg (147 lb)     Intake/Output Summary (Last 24 hours) at 02/18/15 1648 Last data filed at 02/18/15 1400  Gross per 24 hour  Intake 2034.5 ml  Output    375 ml  Net 1659.5 ml     Physical Exam  Awake Alert, Oriented X 3, No new F.N deficits, Normal affect [place].AT,PERRAL Supple Neck,No JVD, No cervical lymphadenopathy appriciated.  Symmetrical Chest wall movement, Good air movement bilaterally,  RRR,No Gallops,Rubs or new Murmurs, No Parasternal Heave +ve B.Sounds, Abd Soft, No tenderness, No organomegaly appriciated, No rebound - guarding or rigidity. No Cyanosis, Clubbing or edema, No new Rash or bruise     Data Review   Micro Results Recent Results (from the past 240 hour(s))  Urine culture     Status: None   Collection Time: 02/17/15 10:43 AM  Result Value Ref Range Status   Specimen Description URINE, CLEAN CATCH  Final   Special Requests NONE  Final   Colony Count NO GROWTH Performed at [facility]   Final   Culture NO GROWTH Performed at [facility]   Final   Report Status 02/18/2015 FINAL  Final    Radiology Reports Dg Chest 2 View  02/17/2015   CLINICAL DATA:  Abdominal pain and emesis  EXAM: CHEST  2 VIEW  COMPARISON:  03/05/2014  FINDINGS: Normal heart size and stable  aortic and hilar contours. There is unchanged distortion of right hilar architecture related to upper lobectomy. There is no edema, consolidation, effusion, or pneumothorax. Mild thoracic dextroscoliosis. No acute osseous findings.  IMPRESSION: 1. No active cardiopulmonary disease. 2. Right upper lobectomy.   Electronically Signed   By: Monte Fantasia M.D.   On: 02/17/2015 07:17   Ct Abdomen Pelvis W Contrast  02/17/2015   CLINICAL DATA:  Acute abdominal pain.  History lung cancer  EXAM: CT ABDOMEN AND PELVIS WITH CONTRAST  TECHNIQUE: Multidetector CT imaging of the abdomen and pelvis was performed using the standard protocol following bolus administration of intravenous contrast.  CONTRAST:  164mL OMNIPAQUE IOHEXOL 300 MG/ML  SOLN  COMPARISON:  CT abdomen 12/05/2014  FINDINGS: Lung  bases are clear.  Hiatal hernia noted.  Normal liver. No liver mass lesion. Gallbladder normal. Common bile duct measures approximately 11 mm in diameter and is mildly enlarged. Correlate with liver function tests. Common bile duct slightly more prominent compared with the prior CT 12/05/2014. Pancreatic duct nondilated. No pancreatic mass. Negative for pancreatitis. Normal spleen.  Normal kidneys. No renal mass or obstruction. No renal calculi. Urinary bladder normal. Normal uterus which is retroverted.  Negative for bowel obstruction. No bowel thickening. Normal appendix.  Negative for free fluid.  No adenopathy identified.  Widespread bony lesions compatible with metastatic disease. These lesions are mostly sclerotic but there are some lesions which are lytic including the left posterior iliac wing. Bone lesions show rapid progression since the CT of 12/05/2014. Similar appearance on the MRI of 02/02/2015. Given history of stage I lung cancer, metastatic lung cancer is possible however also consider metastatic breast cancer. No pathologic fracture. Prior surgical fusion L5-S1 with pseudarthrosis and grade 1 anterior slip.   IMPRESSION: Common bile duct dilated 11 mm. Common duct stone not excluded. No liver lesions  Widespread bony metastatic disease, with rapid progression since 12/05/2014. Given the sclerotic appearance of these lesions, consider breast cancer is well as metastatic lung cancer.   Electronically Signed   By: Franchot Gallo M.D.   On: 02/17/2015 10:15   Dg Esophagus  02/18/2015   CLINICAL DATA:  64 year old female with history of dysphagia, complaining that solid foods get stuck for the past 5 months.  EXAM: ESOPHOGRAM/BARIUM SWALLOW  TECHNIQUE: Combined double contrast and single contrast examination performed using effervescent crystals, thick barium liquid, and thin barium liquid.  FLUOROSCOPY TIME:  If the device does not provide the exposure index:  Fluoroscopy Time:  3 minutes and 27 seconds.  Number of Acquired Images:  39  COMPARISON:  No priors.  FINDINGS: Initial double contrast images of the esophagus demonstrated a normal appearance of the esophageal mucosa. Multiple single swallow attempts were observed, with incomplete propagation of the primary peristaltic waves on every attempt. This was of variable severity, with various degrees of proximal escape ranging from moderate to mild. No tertiary contractions were noted. Full column esophagram demonstrated no esophageal mass, stricture or esophageal ring. No hiatal hernia. No gastroesophageal reflux was observed during a water siphon test. A barium tablet was administered, which passed readily into the stomach.  IMPRESSION: 1. Nonspecific esophageal motility disorder, as above. 2. The esophagus is structurally normal.   Electronically Signed   By: Vinnie Langton M.D.   On: 02/18/2015 15:46   US Abdomen Limited Ruq  02/17/2015   CLINICAL DATA:  Generalized abdominal pain  EXAM: US ABDOMEN LIMITED - RIGHT UPPER QUADRANT  COMPARISON:  None.  FINDINGS: Gallbladder:  No gallstones or wall thickening visualized. No sonographic Murphy sign noted.  Common  bile duct:  Diameter: 5.2 mm  Liver:  No focal lesion identified. Within normal limits in parenchymal echogenicity.  IMPRESSION: Normal   Electronically Signed   By: Franchot Gallo M.D.   On: 02/17/2015 14:20    CBC  Recent Labs Lab 02/17/15 0749 02/18/15 0430  WBC 7.0 7.9  HGB 14.1 13.0  HCT 42.0 39.6  PLT 229 206  MCV 88.8 90.2  MCH 29.8 29.6  MCHC 33.6 32.8  RDW 13.1 13.4  LYMPHSABS 0.9  --   MONOABS 0.3  --   EOSABS 0.0  --   BASOSABS 0.0  --     Chemistries   Recent Labs Lab 02/17/15  0749 02/18/15 0430  NA 138 142  K 3.0* 3.7  CL 97 107  CO2 27 26  GLUCOSE 144* 87  BUN 14 12  CREATININE 0.65 0.57  CALCIUM 9.7 8.8  AST 26  --   ALT 14  --   ALKPHOS 198*  --   BILITOT 0.8  --    ------------------------------------------------------------------------------------------------------------------ estimated creatinine clearance is 59.7 mL/min (by C-G formula based on Cr of 0.57). ------------------------------------------------------------------------------------------------------------------ No results for input(s): HGBA1C in the last 72 hours. ------------------------------------------------------------------------------------------------------------------ No results for input(s): CHOL, HDL, LDLCALC, TRIG, CHOLHDL, LDLDIRECT in the last 72 hours. ------------------------------------------------------------------------------------------------------------------ No results for input(s): TSH, T4TOTAL, T3FREE, THYROIDAB in the last 72 hours.  Invalid input(s): FREET3 ------------------------------------------------------------------------------------------------------------------ No results for input(s): VITAMINB12, FOLATE, FERRITIN, TIBC, IRON, RETICCTPCT in the last 72 hours.  Coagulation profile No results for input(s): INR, PROTIME in the last 168 hours.  No results for input(s): DDIMER in the last 72 hours.  Cardiac Enzymes  Recent Labs Lab 02/17/15 1330   TROPONINI 0.03   ------------------------------------------------------------------------------------------------------------------ Invalid input(s): POCBNP     Time Spent in minutes   35 minutes   ELGERGAWY, DAWOOD M.D on 02/18/2015 at 4:48 PM  Between 7am to 7pm - Pager - [phone]  After 7pm go to [email or web address] - password [email or web address]  And look for the night coverage person covering for me after hours  [facility] Office  [phone]   **Disclaimer: This note may have been dictated with voice recognition software. Similar sounding words can inadvertently be transcribed and this note may contain transcription errors which may not have been corrected upon publication of note.**

## 2015-02-19 LAB — GLUCOSE, CAPILLARY
Glucose-Capillary: 91 mg/dL (ref 70–99)
Glucose-Capillary: 107 mg/dL — ABNORMAL HIGH (ref 70–99)

## 2015-02-19 NOTE — Discharge Instructions (Signed)
Follow with Primary MD Harvie Junior, MD in 7 days  - Keep your previously scheduled appointment with oncology service   Get CBC, CMP, 2 view Chest X ray checked  by Primary MD next visit.    Activity: As tolerated with Full fall precautions use walker/cane & assistance as needed   Disposition Home   Diet: Heart Healthy , mechanical soft , encouraged to eat small bites and slowly ,with feeding assistance and aspiration precautions as needed.  For Heart failure patients - Check your Weight same time everyday, if you gain over 2 pounds, or you develop in leg swelling, experience more shortness of breath or chest pain, call your Primary MD immediately. Follow Cardiac Low Salt Diet and 1.5 lit/day fluid restriction.   On your next visit with your primary care physician please Get Medicines reviewed and adjusted.   Please request your Prim.MD to go over all Hospital Tests and Procedure/Radiological results at the follow up, please get all Hospital records sent to your Prim MD by signing hospital release before you go home.   If you experience worsening of your admission symptoms, develop shortness of breath, life threatening emergency, suicidal or homicidal thoughts you must seek medical attention immediately by calling 911 or calling your MD immediately  if symptoms less severe.  You Must read complete instructions/literature along with all the possible adverse reactions/side effects for all the Medicines you take and that have been prescribed to you. Take any new Medicines after you have completely understood and accpet all the possible adverse reactions/side effects.   Do not drive, operating heavy machinery, perform activities at heights, swimming or participation in water activities or provide baby sitting services if your were admitted for syncope or siezures until you have seen by Primary MD or a Neurologist and advised to do so again.  Do not drive when taking Pain medications.      Do not take more than prescribed Pain, Sleep and Anxiety Medications  Special Instructions: If you have smoked or chewed Tobacco  in the last 2 yrs please stop smoking, stop any regular Alcohol  and or any Recreational drug use.  Wear Seat belts while driving.   Please note  You were cared for by a hospitalist during your hospital stay. If you have any questions about your discharge medications or the care you received while you were in the hospital after you are discharged, you can call the unit and asked to speak with the hospitalist on call if the hospitalist that took care of you is not available. Once you are discharged, your primary care physician will handle any further medical issues. Please note that NO REFILLS for any discharge medications will be authorized once you are discharged, as it is imperative that you return to your primary care physician (or establish a relationship with a primary care physician if you do not have one) for your aftercare needs so that they can reassess your need for medications and monitor your lab values.

## 2015-02-19 NOTE — Evaluation (Signed)
Clinical/Bedside Swallow Evaluation Patient Details  Name: [name] [name] MRN: [id number] Date of Birth: [birth year]  Today's Date: 02/19/2015 Time: SLP Start Time (ACUTE ONLY): 1113 SLP Stop Time (ACUTE ONLY): 1141 SLP Time Calculation (min) (ACUTE ONLY): 28 min  Past Medical History:  Past Medical History  Diagnosis Date  . Hypertension   . Bipolar 1 disorder   . Heart murmur   . Sciatica   . Arthritis   . Anxiety   . Diverticulosis   . Chronic low back pain   . DDD (degenerative disc disease), lumbar   . Chronic shoulder pain   . Chronic neck pain   . Hepatitis B     Unclear when initially diagnosed, labs in Epic from 03/09/13  . Hepatitis C     Unclear when initially diagnosed, labs in Epic from 03/09/13  . C. difficile diarrhea     April and February 2014  . COPD (chronic obstructive pulmonary disease)   . GERD (gastroesophageal reflux disease)   . H/O hiatal hernia   . Headache(784.0)     hx  . PONV (postoperative nausea and vomiting)   . Seizures     on meds  . Diabetes mellitus   . Adenocarcinoma of lung, stage 1    Past Surgical History:  Past Surgical History  Procedure Laterality Date  . Facial tumor removal Right 2000  . Brain tumor removal  09  . Brain surgery      in [place]  . Video bronchoscopy  12/04/2012    Procedure: VIDEO BRONCHOSCOPY;  Surgeon: Grace Isaac, MD;  Location: [facility] OR;  Service: Thoracic;  Laterality: N/A;  . Video assisted thoracoscopy (vats)/wedge resection  12/04/2012    Procedure: VIDEO ASSISTED THORACOSCOPY (VATS)/WEDGE RESECTION;  Surgeon: Grace Isaac, MD;  Location: [place];  Service: Thoracic;  Laterality: Right;  . Lobectomy  12/04/2012    Procedure: LOBECTOMY;  Surgeon: Grace Isaac, MD;  Location: [place];  Service: Thoracic;  Laterality: Right;  completion of right upper lobectomy and lymph node disection, placement of on q pump  . Tubal ligation    . Tonsillectomy    . Anterior cervical decomp/discectomy  fusion N/A 03/05/2014    Procedure: ANTERIOR CERVICAL DECOMPRESSION/DISCECTOMY FUSION 2 LEVELS;  Surgeon: Sinclair Ship, MD;  Location: [place];  Service: Orthopedics;  Laterality: N/A;  Anterior cervical decompression fusion, cervical 4-5, cervical 5-6 with instrumentation, allograft.   HPI:  64 yo female with diagnosis of esophageal dysmotility.  MD ordered SLP to educate pt to esophageal dysmotility compensation strategies.  Pt with PMH of DM, RUL lung cancer, HTN.     Assessment / Plan / Recommendation Clinical Impression  Education re: esophageal dysmotility compensation strategies - OME unremarkable.  Observed pt consuming liquids with good tolerance.  Provided written/verbal information including need to consume several small meals, stay up 3 hours after consumption, consume liquids t/o meal, avoid dry swallows unless at least 15 seconds apart, take medicines with plenty of liquids - consider liquid or chewable administration *ask pharmacist, and consume room temp/warm liquids.   Pt reports she can only get 1/2 through meal before she starts feeling full and sensing lodging in esophagus.  Using teach back, education was completed with this pt.  Advised her to follow up with her GI Md as Dr Waldron Labs recommended.  Thanks for this consult.  Durward Fortes RN that pt desires to see if she can get financial help for liquid supplements, ? Glucerna.  Aspiration Risk  Mild    Diet Recommendation Dysphagia 3 (Mechanical Soft);Thin liquid (extra gravies/sauces, moist foods )   Liquid Administration via: Cup;Straw Medication Administration: Whole meds with liquid (consider liquids format if able) Supervision: Patient able to self feed Compensations: Slow rate;Small sips/bites;Follow solids with liquid (eat small meals) Postural Changes and/or Swallow Maneuvers: Seated upright 90 degrees;Upright 30-60 min after meal    Other  Recommendations Oral Care Recommendations: Oral care BID   Follow Up  Recommendations  None    Frequency and Duration        Pertinent Vitals/Pain Afebrile, decreased      Swallow Study Prior Functional Status   see hhx    General Date of Onset: 02/19/15 HPI: 64 yo female with diagnosis of esophageal dysmotility.  MD ordered SLP to educate pt to esophageal dysmotility compensation strategies.  Pt with PMH of DM, RUL lung cancer, HTN.   Type of Study: Bedside swallow evaluation Diet Prior to this Study: Dysphagia 3 (soft);Thin liquids Temperature Spikes Noted: No Respiratory Status: Room air History of Recent Intubation: No Behavior/Cognition: Alert;Cooperative;Pleasant mood Oral Cavity - Dentition: Edentulous (pt reports having upper denture - not present at this time) Self-Feeding Abilities: Able to feed self Patient Positioning: Upright in bed Baseline Vocal Quality: Clear Volitional Cough: Strong Volitional Swallow: Able to elicit    Oral/Motor/Sensory Function Overall Oral Motor/Sensory Function: Appears within functional limits for tasks assessed   Ice Chips Ice chips: Not tested   Thin Liquid Thin Liquid: Within functional limits Presentation: Cup    Nectar Thick Nectar Thick Liquid: Not tested   Honey Thick Honey Thick Liquid: Not tested   Puree Puree: Not tested   Solid   GO    Solid: Not tested       Luanna Salk, [facility] SLP [phone]

## 2015-02-19 NOTE — Progress Notes (Signed)
Chaplain responding to spiritual care consult re: outpatient support through cancer center.   Pt was meeting with speech therapy at chaplain arrival.  Briefly provided calendar of support groups, [facility] brochure, living with cancer group description as well as other resources for support in cancer center.  Pt was supported by another chaplain earlier in day.  Will consult with this chaplain and follow up with pt after speech therapy for continued support.   [place], [place]

## 2015-02-19 NOTE — Discharge Summary (Signed)
[name] [name], 64 y.o., DOB Sep 06, 1951, MRN [id number]. Admission date: 02/17/2015 Discharge Date 02/19/2015 Primary MD Harvie Junior, MD Admitting Physician Albertine Patricia, MD   PCP please follow-up on: - Check CBC, BMP during next visit. 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    Admission Diagnosis  Abdominal pain, generalized [R10.84] Abdominal pain, acute [R10.0]  Discharge Diagnosis   Principal Problem:   Abdominal pain, epigastric Active Problems:   Lung cancer, right upper lobe   Hypertension   Diabetes mellitus   Osseous metastasis      Past Medical History  Diagnosis Date  . Hypertension   . Bipolar 1 disorder   . Heart murmur   . Sciatica   . Arthritis   . Anxiety   . Diverticulosis   . Chronic low back pain   . DDD (degenerative disc disease), lumbar   . Chronic shoulder pain   . Chronic neck pain   . Hepatitis B     Unclear when initially diagnosed, labs in Epic from 03/09/13  . Hepatitis C     Unclear when initially diagnosed, labs in Epic from 03/09/13  . C. difficile diarrhea     April and February 2014  . COPD (chronic obstructive pulmonary disease)   . GERD (gastroesophageal reflux disease)   . H/O hiatal hernia   . Headache(784.0)     hx  . PONV (postoperative nausea and vomiting)   . Seizures     on meds  . Diabetes mellitus   . Adenocarcinoma of lung, stage 1     Past Surgical History  Procedure Laterality Date  . Facial tumor removal Right 2000  . Brain tumor removal  09  . Brain surgery      in [place]  . Video bronchoscopy  12/04/2012    Procedure: VIDEO BRONCHOSCOPY;  Surgeon: Grace Isaac, MD;  Location: [facility];  Service: Thoracic;  Laterality: N/A;  . Video assisted thoracoscopy (vats)/wedge resection  12/04/2012    Procedure: VIDEO ASSISTED THORACOSCOPY (VATS)/WEDGE RESECTION;  Surgeon: Grace Isaac, MD;  Location: [place];  Service: Thoracic;  Laterality: Right;  . Lobectomy  12/04/2012    Procedure: LOBECTOMY;  Surgeon: Grace Isaac, MD;  Location: [place];  Service: Thoracic;  Laterality: Right;  completion of right upper lobectomy and lymph node disection, placement of on q pump  . Tubal ligation    . Tonsillectomy    . Anterior cervical decomp/discectomy fusion N/A 03/05/2014    Procedure: ANTERIOR CERVICAL DECOMPRESSION/DISCECTOMY FUSION 2 LEVELS;  Surgeon: Sinclair Ship, MD;  Location: [place];  Service: Orthopedics;  Laterality: N/A;  Anterior cervical decompression fusion, cervical 4-5, cervical 5-6 with instrumentation, allograft.     Hospital Course See H&P, Labs, Consult and Test reports for all details in brief, patient was admitted for **  Principal Problem:   Abdominal pain, epigastric Active Problems:   Lung cancer, right upper lobe   Hypertension   Diabetes mellitus   Osseous metastasis [name] [name] is a 64 y.o. female, with past medical history of hypertension, diabetes mellitus, hepatitis C virus, hepatitis C virus, lung adenocarcinoma, status post right upper lobectomy, recent diagnosis of bone metastasis ,bipolar disorder, anxiety, IBS and hx of diverticulitis and c diff colitis, presents with complaints of abdominal pain, patient reports she she has chronic abdominal pain, IBS, but the pain worsened over the last 24 hours, CT abdomen and  pelvis was significant for 11 mm common bile duct, no evidence of gallstones, right upper quadrant ultrasound without acute findings , patient required IV Dilaudid 2 in ED, so hospitalist requested to admit for pain management, no leukocytosis, no fever, patient denies any diarrhea, ports constipation, nausea, and mild vomiting - Patient was recently diagnosed with bone metastatic disease, followed with oncology as an outpatient, bone metastasis was thought to be secondary to lung cancer. Patient nausea vomiting, significantly improved, no  acute finding in imaging, and has been complaining of mild intermittent dysphagia which has been chronic for years, esophageogram gram was done, showing motility disorder. Patient dysphagia much improved after her diet transitioned to mechanical soft.  Abdominal pain - Patient has history of chronic abdominal pain, IBS, afebrile, no leukocytosis,  - Unclear etiology, no acute findings in CT abdomen and pelvis, right upper quadrant ultrasound to explain this pain. Most likely secondary to referred pain from her bone metastasis pain. - Patient  resolved, no further nausea or vomiting   Dysphagia - ESOPHOGRAM/BARIUM SWALLOW done 3/31, showing no structural abnormality, showing motility disorder ,patient to continue mechanical soft diet,  , will need to follow with GI as an outpatient for possible need for endoscopy , instructed to follow with her GI physician Dr. Oletta Lamas.  Hypertension - Resume home medication on discharge  Hypokalemia - Repleted  Osseous metastasis - [name] been followed by Dr. Annamaria Boots as an outpatient, with known history of lung cancer in the past.   Diabetes mellitus - Resume metformin on discharge   Consults   None  Significant Tests:  See full reports for all details    Dg Chest 2 View  02/17/2015   CLINICAL DATA:  Abdominal pain and emesis  EXAM: CHEST  2 VIEW  COMPARISON:  03/05/2014  FINDINGS: Normal heart size and stable aortic and hilar contours. There is unchanged distortion of right hilar architecture related to upper lobectomy. There is no edema, consolidation, effusion, or pneumothorax. Mild thoracic dextroscoliosis. No acute osseous findings.  IMPRESSION: 1. No active cardiopulmonary disease. 2. Right upper lobectomy.   Electronically Signed   By: Monte Fantasia M.D.   On: 02/17/2015 07:17   Mr Lumbar Spine Wo Contrast  02/02/2015   CLINICAL DATA:  64 year old female with unexplained lumbar back pain. Personal history of right lung cancer in 2013.  Current history of hepatitis.  EXAM: MRI LUMBAR SPINE WITHOUT CONTRAST  TECHNIQUE: Multiplanar, multisequence MR imaging of the lumbar spine was performed. No intravenous contrast was administered.  COMPARISON:  CT Abdomen and Pelvis 12/05/2014 and earlier. [facility] lumbar MRI 03/01/2013.  FINDINGS: New since 2014 numerous T1 hypo intense T2 heterogeneous and STIR hyperintense bone lesions throughout the visible skeleton, including the lower thoracic spine, lumbar spine, sacrum, and visible pelvis. Complete replacement of mid the T12 vertebral body marrow with subtle ventral epidural extension (series 800, image 6). No pathologic fracture identified. Most other visible lesions are 6-12 mm diameter.  No lumbar epidural or foraminal tumor identified.  Visualized lower thoracic spinal cord is normal with conus medularis at L1-L2. Normal noncontrast appearance of the cauda equina nerve roots.  Stable visualized abdominal viscera.  Preexisting superimposed L5-S1 postoperative changes in the interbody space and on the left. No significant degenerative lumbar spinal stenosis.  IMPRESSION: 1. Diffuse osseous metastatic disease. 2. Near complete replacement of the T12 vertebral body by tumor. Subtle ventral epidural tumor extension. No pathologic fracture or malignant neural impingement in the visualized spine. 3. Normal noncontrast  appearance of the visualized lower spinal cord, and cauda equina   Electronically Signed   By: Genevie Ann M.D.   On: 02/02/2015 17:56   Ct Abdomen Pelvis W Contrast  02/17/2015   CLINICAL DATA:  Acute abdominal pain.  History lung cancer  EXAM: CT ABDOMEN AND PELVIS WITH CONTRAST  TECHNIQUE: Multidetector CT imaging of the abdomen and pelvis was performed using the standard protocol following bolus administration of intravenous contrast.  CONTRAST:  143mL OMNIPAQUE IOHEXOL 300 MG/ML  SOLN  COMPARISON:  CT abdomen 12/05/2014  FINDINGS: Lung bases are clear.  Hiatal  hernia noted.  Normal liver. No liver mass lesion. Gallbladder normal. Common bile duct measures approximately 11 mm in diameter and is mildly enlarged. Correlate with liver function tests. Common bile duct slightly more prominent compared with the prior CT 12/05/2014. Pancreatic duct nondilated. No pancreatic mass. Negative for pancreatitis. Normal spleen.  Normal kidneys. No renal mass or obstruction. No renal calculi. Urinary bladder normal. Normal uterus which is retroverted.  Negative for bowel obstruction. No bowel thickening. Normal appendix.  Negative for free fluid.  No adenopathy identified.  Widespread bony lesions compatible with metastatic disease. These lesions are mostly sclerotic but there are some lesions which are lytic including the left posterior iliac wing. Bone lesions show rapid progression since the CT of 12/05/2014. Similar appearance on the MRI of 02/02/2015. Given history of stage I lung cancer, metastatic lung cancer is possible however also consider metastatic breast cancer. No pathologic fracture. Prior surgical fusion L5-S1 with pseudarthrosis and grade 1 anterior slip.  IMPRESSION: Common bile duct dilated 11 mm. Common duct stone not excluded. No liver lesions  Widespread bony metastatic disease, with rapid progression since 12/05/2014. Given the sclerotic appearance of these lesions, consider breast cancer is well as metastatic lung cancer.   Electronically Signed   By: Franchot Gallo M.D.   On: 02/17/2015 10:15   Dg Esophagus  02/18/2015   CLINICAL DATA:  64 year old female with history of dysphagia, complaining that solid foods get stuck for the past 5 months.  EXAM: ESOPHOGRAM/BARIUM SWALLOW  TECHNIQUE: Combined double contrast and single contrast examination performed using effervescent crystals, thick barium liquid, and thin barium liquid.  FLUOROSCOPY TIME:  If the device does not provide the exposure index:  Fluoroscopy Time:  3 minutes and 27 seconds.  Number of Acquired  Images:  39  COMPARISON:  No priors.  FINDINGS: Initial double contrast images of the esophagus demonstrated a normal appearance of the esophageal mucosa. Multiple single swallow attempts were observed, with incomplete propagation of the primary peristaltic waves on every attempt. This was of variable severity, with various degrees of proximal escape ranging from moderate to mild. No tertiary contractions were noted. Full column esophagram demonstrated no esophageal mass, stricture or esophageal ring. No hiatal hernia. No gastroesophageal reflux was observed during a water siphon test. A barium tablet was administered, which passed readily into the stomach.  IMPRESSION: 1. Nonspecific esophageal motility disorder, as above. 2. The esophagus is structurally normal.   Electronically Signed   By: Vinnie Langton M.D.   On: 02/18/2015 15:46   US Abdomen Limited Ruq  02/17/2015   CLINICAL DATA:  Generalized abdominal pain  EXAM: US ABDOMEN LIMITED - RIGHT UPPER QUADRANT  COMPARISON:  None.  FINDINGS: Gallbladder:  No gallstones or wall thickening visualized. No sonographic Murphy sign noted.  Common bile duct:  Diameter: 5.2 mm  Liver:  No focal lesion identified. Within normal limits in parenchymal echogenicity.  IMPRESSION: Normal   Electronically Signed   By: Franchot Gallo M.D.   On: 02/17/2015 14:20     Today   Subjective:   Clare Fennimore today has no headache,no chest abdominal pain,no new weakness tingling or numbness, feels much better wants to go home today.   Objective:   Blood pressure 161/73, pulse 68, temperature 98.4 F (36.9 C), temperature source Oral, resp. rate 16, height 5\' 4"  (1.626 m), weight 62.6 kg (138 lb 0.1 oz), SpO2 95 %.  Intake/Output Summary (Last 24 hours) at 02/19/15 1052 Last data filed at 02/19/15 0900  Gross per 24 hour  Intake   2982 ml  Output   2150 ml  Net    832 ml    Exam Awake Alert, Oriented *3, No new F.N deficits, Normal  affect [place].AT,PERRAL Supple Neck,No JVD, No cervical lymphadenopathy appriciated.  Symmetrical Chest wall movement, Good air movement bilaterally, CTAB RRR,No Gallops,Rubs or new Murmurs, No Parasternal Heave +ve B.Sounds, Abd Soft, Non tender, No organomegaly appriciated, No rebound -guarding or rigidity. No Cyanosis, Clubbing or edema, No new Rash or bruise  Data Review   Cultures -   CBC w Diff: Lab Results  Component Value Date   WBC 7.9 02/18/2015   WBC 7.5 02/11/2015   HGB 13.0 02/18/2015   HGB 12.6 02/11/2015   HCT 39.6 02/18/2015   HCT 39.1 02/11/2015   PLT 206 02/18/2015   PLT 246 02/11/2015   LYMPHOPCT 12 02/17/2015   LYMPHOPCT 28.6 02/11/2015   MONOPCT 5 02/17/2015   MONOPCT 7.2 02/11/2015   EOSPCT 0 02/17/2015   EOSPCT 1.1 02/11/2015   BASOPCT 0 02/17/2015   BASOPCT 1.5 02/11/2015   CMP: Lab Results  Component Value Date   NA 142 02/18/2015   K 3.7 02/18/2015   CL 107 02/18/2015   CO2 26 02/18/2015   BUN 12 02/18/2015   CREATININE 0.57 02/18/2015   PROT 8.0 02/17/2015   ALBUMIN 4.5 02/17/2015   BILITOT 0.8 02/17/2015   ALKPHOS 198* 02/17/2015   AST 26 02/17/2015   ALT 14 02/17/2015  .  Micro Results Recent Results (from the past 240 hour(s))  Urine culture     Status: None   Collection Time: 02/17/15 10:43 AM  Result Value Ref Range Status   Specimen Description URINE, CLEAN CATCH  Final   Special Requests NONE  Final   Colony Count NO GROWTH Performed at [facility]   Final   Culture NO GROWTH Performed at [facility]   Final   Report Status 02/18/2015 FINAL  Final     Discharge Instructions     Mechanical soft diet Follow-up Information    Follow up with Harvie Junior, MD. Schedule an appointment as soon as possible for a visit in 1 week.   Specialty:  Specialist   Why:  Posthospitalization follow-up   Contact information:   [address] [phone]       Follow up with Winfield Cunas, MD.   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   Contact information:   [address] [place] [address] [phone]       Discharge Medications     Medication List    TAKE these medications        ALPRAZolam 1 MG tablet  Commonly known as:  XANAX  Take 0.5 mg by mouth 2 (two) times daily.  COMBIVENT RESPIMAT 20-100 MCG/ACT Aers respimat  Generic drug:  Ipratropium-Albuterol  Inhale 2 puffs into the lungs daily as needed for wheezing or shortness of breath.     dicyclomine 20 MG tablet  Commonly known as:  BENTYL  Take 20 mg by mouth 3 (three) times daily with meals.     gabapentin 400 MG capsule  Commonly known as:  NEURONTIN  Take 400 mg by mouth 3 (three) times daily. Nerve pain     KLOR-CON M20 20 MEQ tablet  Generic drug:  potassium chloride SA  Take 20 mEq by mouth daily. with food     loratadine 10 MG tablet  Commonly known as:  CLARITIN  Take 10 mg by mouth daily.     metFORMIN 500 MG tablet  Commonly known as:  GLUCOPHAGE  Take 500 mg by mouth 2 (two) times daily with a meal.     metoprolol tartrate 25 MG tablet  Commonly known as:  LOPRESSOR  Take 25 mg by mouth 2 (two) times daily.     MUSCLE RUB 10-15 % Crea  Apply 1 application topically daily as needed for muscle pain. Left shoulder pain     olmesartan-hydrochlorothiazide 40-25 MG per tablet  Commonly known as:  BENICAR HCT  Take 1 tablet by mouth daily.     omeprazole 20 MG capsule  Commonly known as:  PRILOSEC  Take 20 mg by mouth 2 (two) times daily before a meal.     ondansetron 8 MG tablet  Commonly known as:  ZOFRAN  Take 1 tablet by mouth daily as needed for nausea or vomiting.     OXcarbazepine 150 MG tablet  Commonly known as:  TRILEPTAL  Take 150 mg by mouth 3 (three) times daily.     Oxycodone HCl 10 MG Tabs  Take 10 mg by mouth 2 (two) times daily as needed. for pain     promethazine  25 MG tablet  Commonly known as:  PHENERGAN  Take 1 tablet (25 mg total) by mouth every 8 (eight) hours as needed for nausea or vomiting.     senna-docusate 8.6-50 MG per tablet  Commonly known as:  Senokot-S  Take 1 tablet by mouth at bedtime as needed for mild constipation.     simvastatin 20 MG tablet  Commonly known as:  ZOCOR  Take 20 mg by mouth at bedtime.     tiZANidine 4 MG tablet  Commonly known as:  ZANAFLEX  Take 4 mg by mouth every 8 (eight) hours as needed for muscle spasms.     triamcinolone cream 0.1 %  Commonly known as:  KENALOG  Apply 1 application topically 2 (two) times daily as needed (for rash).     VITAMIN B-12 IJ  Inject as directed every 30 (thirty) days. Administered by Dr. York Ram around the 1st of each month     VITAMIN D PO  Take 1 tablet by mouth once a week. monday         Total Time in preparing paper work, data evaluation and todays exam - 35 minutes  Verlin Uher M.D on 02/19/2015 at 10:52 AM  [facility] Office  [phone]

## 2015-02-19 NOTE — Progress Notes (Signed)
CSW received notification from RN that pt needing assistance with transportation to home.   CSW met with pt at bedside who confirmed that her daughter was unable to provide transportation for her home today. Pt reports that she has no other resource for transportation.   CSW spoke with RN who reports that pt is steady on her feet, but cab would be safest mode of transportation.  CSW checked pt address and bus route and pt would have over 10 minute walk to bus stop to pt apartment.   CSW provided RN with cab voucher for pt upon discharge.  RN plans to arrange cab for pick up.   No further social work needs identified.   CSW signing off.   Alison Murray, MSW, [facility] [employer] [phone]

## 2015-02-21 ENCOUNTER — Other Ambulatory Visit: Payer: Self-pay | Admitting: Hematology

## 2015-02-21 DIAGNOSIS — C7951 Secondary malignant neoplasm of bone: Secondary | ICD-10-CM

## 2015-02-24 ENCOUNTER — Encounter (HOSPITAL_COMMUNITY): Payer: Self-pay

## 2015-02-24 ENCOUNTER — Ambulatory Visit (HOSPITAL_COMMUNITY)
Admission: RE | Admit: 2015-02-24 | Discharge: 2015-02-24 | Disposition: A | Discharge status: Home / Self Care | Payer: Medicare HMO | Source: Ambulatory Visit | Attending: Hematology | Admitting: Hematology

## 2015-02-24 DIAGNOSIS — C7951 Secondary malignant neoplasm of bone: Secondary | ICD-10-CM | POA: Diagnosis present

## 2015-02-24 DIAGNOSIS — C801 Malignant (primary) neoplasm, unspecified: Secondary | ICD-10-CM | POA: Diagnosis not present

## 2015-02-24 DIAGNOSIS — R911 Solitary pulmonary nodule: Secondary | ICD-10-CM | POA: Diagnosis not present

## 2015-02-25 ENCOUNTER — Other Ambulatory Visit: Payer: Self-pay | Admitting: Hematology

## 2015-02-25 ENCOUNTER — Telehealth: Payer: Self-pay | Admitting: *Deleted

## 2015-02-25 ENCOUNTER — Other Ambulatory Visit: Payer: Self-pay | Admitting: *Deleted

## 2015-02-25 DIAGNOSIS — C7951 Secondary malignant neoplasm of bone: Secondary | ICD-10-CM

## 2015-02-25 NOTE — Telephone Encounter (Signed)
Received call from [facility] stating that [facility] appproved PET & this is good for one mo.  Message to Dr Lucas Mallow have to reorder PET.

## 2015-02-26 ENCOUNTER — Telehealth: Payer: Self-pay | Admitting: Hematology

## 2015-02-26 ENCOUNTER — Ambulatory Visit (HOSPITAL_COMMUNITY)
Admission: RE | Admit: 2015-02-26 | Discharge: 2015-02-26 | Disposition: A | Discharge status: Home / Self Care | Payer: Medicare HMO | Source: Ambulatory Visit | Attending: Hematology | Admitting: Hematology

## 2015-02-26 DIAGNOSIS — C7951 Secondary malignant neoplasm of bone: Secondary | ICD-10-CM | POA: Insufficient documentation

## 2015-02-26 NOTE — Telephone Encounter (Signed)
Lft msg for pt confirming labs/ov per 04/07 r/s due to needed to be after CT Biopsy, mailed schedule to pt... KJ

## 2015-03-01 ENCOUNTER — Other Ambulatory Visit: Payer: Self-pay | Admitting: Hematology

## 2015-03-01 ENCOUNTER — Telehealth: Payer: Self-pay | Admitting: *Deleted

## 2015-03-01 ENCOUNTER — Telehealth: Payer: Self-pay | Admitting: Hematology

## 2015-03-01 ENCOUNTER — Ambulatory Visit (HOSPITAL_COMMUNITY)
Admission: RE | Admit: 2015-03-01 | Discharge: 2015-03-01 | Disposition: A | Discharge status: Home / Self Care | Payer: Medicare HMO | Source: Ambulatory Visit | Attending: Hematology | Admitting: Hematology

## 2015-03-01 DIAGNOSIS — G9389 Other specified disorders of brain: Secondary | ICD-10-CM | POA: Insufficient documentation

## 2015-03-01 DIAGNOSIS — C7951 Secondary malignant neoplasm of bone: Secondary | ICD-10-CM

## 2015-03-01 DIAGNOSIS — Z85118 Personal history of other malignant neoplasm of bronchus and lung: Secondary | ICD-10-CM | POA: Diagnosis not present

## 2015-03-01 NOTE — Telephone Encounter (Signed)
Called & spoke to Jennifer/Central Scheduling regarding PET order.  Scheduled for April 20 @ 8 am@ [facility].  NPO 6 hr prior.  Left message for pt to call back to make sure she has these instructions.  POF to scheduler to delay MD appt to after PET.  Dr. Burr Medico notified.

## 2015-03-01 NOTE — Telephone Encounter (Signed)
Confirm appointment for 04/21

## 2015-03-02 ENCOUNTER — Other Ambulatory Visit: Payer: Self-pay | Admitting: Radiology

## 2015-03-02 ENCOUNTER — Ambulatory Visit: Payer: Self-pay | Admitting: Hematology

## 2015-03-02 ENCOUNTER — Other Ambulatory Visit: Payer: Self-pay

## 2015-03-03 ENCOUNTER — Other Ambulatory Visit: Payer: Self-pay | Admitting: *Deleted

## 2015-03-03 ENCOUNTER — Telehealth: Payer: Self-pay | Admitting: *Deleted

## 2015-03-03 ENCOUNTER — Other Ambulatory Visit: Payer: Self-pay | Admitting: Radiology

## 2015-03-03 ENCOUNTER — Telehealth: Payer: Self-pay | Admitting: Oncology

## 2015-03-03 ENCOUNTER — Emergency Department (HOSPITAL_COMMUNITY)
Admission: EM | Admit: 2015-03-03 | Discharge: 2015-03-03 | Disposition: A | Discharge status: Home / Self Care | Payer: Medicare HMO | Attending: Emergency Medicine | Admitting: Emergency Medicine

## 2015-03-03 ENCOUNTER — Encounter (HOSPITAL_COMMUNITY): Payer: Self-pay | Admitting: Emergency Medicine

## 2015-03-03 DIAGNOSIS — J449 Chronic obstructive pulmonary disease, unspecified: Secondary | ICD-10-CM | POA: Diagnosis not present

## 2015-03-03 DIAGNOSIS — Z79899 Other long term (current) drug therapy: Secondary | ICD-10-CM | POA: Diagnosis not present

## 2015-03-03 DIAGNOSIS — E119 Type 2 diabetes mellitus without complications: Secondary | ICD-10-CM | POA: Diagnosis not present

## 2015-03-03 DIAGNOSIS — G40909 Epilepsy, unspecified, not intractable, without status epilepticus: Secondary | ICD-10-CM | POA: Insufficient documentation

## 2015-03-03 DIAGNOSIS — Z8619 Personal history of other infectious and parasitic diseases: Secondary | ICD-10-CM | POA: Diagnosis not present

## 2015-03-03 DIAGNOSIS — F319 Bipolar disorder, unspecified: Secondary | ICD-10-CM | POA: Insufficient documentation

## 2015-03-03 DIAGNOSIS — F419 Anxiety disorder, unspecified: Secondary | ICD-10-CM | POA: Insufficient documentation

## 2015-03-03 DIAGNOSIS — R011 Cardiac murmur, unspecified: Secondary | ICD-10-CM | POA: Diagnosis not present

## 2015-03-03 DIAGNOSIS — C7951 Secondary malignant neoplasm of bone: Secondary | ICD-10-CM | POA: Insufficient documentation

## 2015-03-03 DIAGNOSIS — M1990 Unspecified osteoarthritis, unspecified site: Secondary | ICD-10-CM | POA: Insufficient documentation

## 2015-03-03 DIAGNOSIS — M549 Dorsalgia, unspecified: Secondary | ICD-10-CM | POA: Diagnosis present

## 2015-03-03 DIAGNOSIS — Z880 Allergy status to penicillin: Secondary | ICD-10-CM | POA: Insufficient documentation

## 2015-03-03 DIAGNOSIS — C3490 Malignant neoplasm of unspecified part of unspecified bronchus or lung: Secondary | ICD-10-CM | POA: Insufficient documentation

## 2015-03-03 DIAGNOSIS — I10 Essential (primary) hypertension: Secondary | ICD-10-CM | POA: Diagnosis not present

## 2015-03-03 DIAGNOSIS — G893 Neoplasm related pain (acute) (chronic): Secondary | ICD-10-CM | POA: Insufficient documentation

## 2015-03-03 DIAGNOSIS — K219 Gastro-esophageal reflux disease without esophagitis: Secondary | ICD-10-CM | POA: Diagnosis not present

## 2015-03-03 DIAGNOSIS — Z87891 Personal history of nicotine dependence: Secondary | ICD-10-CM | POA: Insufficient documentation

## 2015-03-03 DIAGNOSIS — M898X9 Other specified disorders of bone, unspecified site: Secondary | ICD-10-CM

## 2015-03-03 MED ORDER — HYDROMORPHONE HCL 2 MG/ML IJ SOLN
2.0000 mg | Freq: Once | INTRAMUSCULAR | Status: AC
  Administered 2015-03-03: 2 mg via INTRAMUSCULAR
  Filled 2015-03-03: qty 1

## 2015-03-03 NOTE — ED Notes (Addendum)
Pt reports hip and back pain 10/10. Note from oncologist on 3/24 states that pt has lung cancer and diffuse bone lesions, likely metastic cancer, that is uncurable. Pt reports pain increased yesterday. Denies dysuria. Denies v/d.

## 2015-03-03 NOTE — Telephone Encounter (Signed)
Called [name] [name] and advised her of her consult appointment with Dr. Sondra Come on 03/11/15. [name] [name] said she was aware of the appointment.  She then asked when she is getting a PICC line.  Advised her that Dr. Sondra Come does not order PICC lines but that I would speak to Dr. Ernestina Penna nurse and call her back.

## 2015-03-03 NOTE — ED Notes (Addendum)
Pt c/o pain in spine where she had found cancer previously, and left hip pain 10/10. Denies loss of control of bowel or bladder. Ambulatory without much difficulty. Pt denies current chemo or radiation treatment, states she is waiting on placement of PICC line.

## 2015-03-03 NOTE — Telephone Encounter (Signed)
Received call from patient stating "I am currently in the emergency room at [facility] and they gave me a pain shot and are trying to kick me out. I know if I leave I will be right back here in a few hours." Told patient that I will refer her information to Dr. Burr Medico.  Information noted above discussed with Janifer Adie, RN who is agreeable to pass along to Dr. Burr Medico.

## 2015-03-03 NOTE — ED Notes (Signed)
Pt alert and oriented x4. Respirations even and unlabored, bilateral symmetrical rise and fall of chest. Skin warm and dry. In no acute distress. Denies needs.   

## 2015-03-03 NOTE — Telephone Encounter (Signed)
Pt in ED/[facility] & has spoken with triage RN & is very upset.  Returned pt's call & pt reports that she had a shot in her muscle & they are going to send her home.  She states that only IV med will help her pain.  She is talking constantly & hard for RN to get a word in. She informed me that she is bipolar.  I think I understand from her that she went for an MRI 03/01/15 & they were unable to do b/c they couldn't get IV access & she says they said she needed a PICC line.  She states she is afraid of the oxycodone.  Encouraged pt to take her pain med if she is hurting & hopefully pain will not get out of control & she end up in the ED.  Called & spoke with MRI staff & they reported that pt was there 03/01/15 & IV team was unable to access her veins & MRI without contrast was done.  They report that pt said she was to have a PICC placed on Monday but I'm not sure who ordered this since I can't find an order anywhere.  Encouraged pt to listen to ED doc & if d/c, to take pain meds as prescribed & to call back if needs to be seen in our office. Informed again of appt for tomorrow & encouraged to keep so MD will have information for 03/11/15 visit.  Will discuss with Dr. Burr Medico when she is back in the office. Called pt back to see if she r/s her radiation oncology appt & she doesn't seem to know anything about this.  Per notes, she called & cancelled appt b/c she was still in [place].  She states I'm in no shape to talk to anybody right now.  Will call Rad Onc & have them call her to r/s. Talked with Stanton Kidney RN/Rad Onc & she will relay message to appropriate personnel to get r/s.

## 2015-03-03 NOTE — ED Provider Notes (Signed)
CSN: [id number]     Arrival date & time 03/03/15  1200 History   First MD Initiated Contact with Patient 03/03/15 1219     Chief Complaint  Patient presents with  . Back Pain  . Hip Pain     HPI Patient presents to the emergency department complaining of recurrent back leg and arm pain.  She states this is consistent with her prior static bone pain.  She states for the past several years she's had worsening pain in her primary care team is treating her with 10 mg hydrocodone.  She states this does not seem to be improving her pain.  She recently spoke with her physician and no changes in her pain medications given.  She denies fevers and chills.  Denies nausea vomiting and diarrhea.  No other complaints.   Past Medical History  Diagnosis Date  . Hypertension   . Bipolar 1 disorder   . Heart murmur   . Sciatica   . Arthritis   . Anxiety   . Diverticulosis   . Chronic low back pain   . DDD (degenerative disc disease), lumbar   . Chronic shoulder pain   . Chronic neck pain   . Hepatitis B     Unclear when initially diagnosed, labs in Epic from 03/09/13  . Hepatitis C     Unclear when initially diagnosed, labs in Epic from 03/09/13  . C. difficile diarrhea     April and February 2014  . COPD (chronic obstructive pulmonary disease)   . GERD (gastroesophageal reflux disease)   . H/O hiatal hernia   . Headache(784.0)     hx  . PONV (postoperative nausea and vomiting)   . Seizures     on meds  . Diabetes mellitus   . Adenocarcinoma of lung, stage 1    Past Surgical History  Procedure Laterality Date  . Facial tumor removal Right 2000  . Brain tumor removal  09  . Brain surgery      in [place]  . Video bronchoscopy  12/04/2012    Procedure: VIDEO BRONCHOSCOPY;  Surgeon: Grace Isaac, MD;  Location: [facility] OR;  Service: Thoracic;  Laterality: N/A;  . Video assisted thoracoscopy (vats)/wedge resection  12/04/2012    Procedure: VIDEO ASSISTED THORACOSCOPY (VATS)/WEDGE  RESECTION;  Surgeon: Grace Isaac, MD;  Location: [place];  Service: Thoracic;  Laterality: Right;  . Lobectomy  12/04/2012    Procedure: LOBECTOMY;  Surgeon: Grace Isaac, MD;  Location: [place];  Service: Thoracic;  Laterality: Right;  completion of right upper lobectomy and lymph node disection, placement of on q pump  . Tubal ligation    . Tonsillectomy    . Anterior cervical decomp/discectomy fusion N/A 03/05/2014    Procedure: ANTERIOR CERVICAL DECOMPRESSION/DISCECTOMY FUSION 2 LEVELS;  Surgeon: Sinclair Ship, MD;  Location: [place];  Service: Orthopedics;  Laterality: N/A;  Anterior cervical decompression fusion, cervical 4-5, cervical 5-6 with instrumentation, allograft.   Family History  Problem Relation Age of Onset  . Hypertension Father     deceased  . Diabetes Father   . Heart disease Father   . Hyperlipidemia Father   . Hypertension Mother   . Hyperlipidemia Mother   . Diabetes Mother   . Hypertension Sister   . Hyperlipidemia Sister   . Hypertension Brother   . Hyperlipidemia Brother    History  Substance Use Topics  . Smoking status: Former Smoker -- 0.25 packs/day for 10 years  Types: Cigarettes    Quit date: 07/07/2013  . Smokeless tobacco: Never Used     Comment: Smokes pk q 3 days   marajuna aug  . Alcohol Use: No     Comment: quit 45yrs ago   OB History    No data available     Review of Systems  All other systems reviewed and are negative.     Allergies  Haldol; Acetaminophen; Ibuprofen; Metronidazole; Morphine and related; and Penicillins  Home Medications   Prior to Admission medications   Medication Sig Start Date End Date Taking? Authorizing Provider  ALPRAZolam Duanne Moron) 1 MG tablet Take 0.5 mg by mouth 2 (two) times daily.    Yes Historical Provider, MD  Cyanocobalamin (VITAMIN B-12 IJ) Inject as directed every 30 (thirty) days. Administered by Dr. York Ram around the 1st of each month   Yes Historical Provider, MD   dicyclomine (BENTYL) 20 MG tablet Take 20 mg by mouth 3 (three) times daily with meals.   Yes Historical Provider, MD  gabapentin (NEURONTIN) 400 MG capsule Take 400 mg by mouth 3 (three) times daily. Nerve pain   Yes Historical Provider, MD  HYDROcodone-acetaminophen (NORCO) 10-325 MG per tablet Take 1 tablet by mouth 3 (three) times daily.   Yes Historical Provider, MD  Ipratropium-Albuterol (COMBIVENT RESPIMAT) 20-100 MCG/ACT AERS respimat Inhale 2 puffs into the lungs daily as needed for wheezing or shortness of breath.    Yes Historical Provider, MD  KLOR-CON M20 20 MEQ tablet Take 20 mEq by mouth every morning. with food 01/04/15  Yes Historical Provider, MD  loratadine (CLARITIN) 10 MG tablet Take 10 mg by mouth every morning.    Yes Historical Provider, MD  Menthol-Methyl Salicylate (MUSCLE RUB) 10-15 % CREA Apply 1 application topically daily as needed for muscle pain. Left shoulder pain   Yes Historical Provider, MD  metFORMIN (GLUCOPHAGE) 500 MG tablet Take 500 mg by mouth 2 (two) times daily with a meal.   Yes Historical Provider, MD  metoprolol tartrate (LOPRESSOR) 25 MG tablet Take 25 mg by mouth 2 (two) times daily.  11/24/13  Yes Historical Provider, MD  olmesartan-hydrochlorothiazide (BENICAR HCT) 40-25 MG per tablet Take 1 tablet by mouth every morning.    Yes Historical Provider, MD  omeprazole (PRILOSEC) 20 MG capsule Take 20 mg by mouth 2 (two) times daily before a meal.  01/16/14  Yes Historical Provider, MD  ondansetron (ZOFRAN-ODT) 8 MG disintegrating tablet Take 1 tablet by mouth every 6 (six) hours as needed for nausea or vomiting.  03/02/15  Yes Historical Provider, MD  OXcarbazepine (TRILEPTAL) 150 MG tablet Take 150 mg by mouth 3 (three) times daily.  11/24/13  Yes Historical Provider, MD  polyethylene glycol (MIRALAX / GLYCOLAX) packet Take 17 g by mouth 2 (two) times daily as needed for mild constipation or moderate constipation.   Yes Historical Provider, MD  simvastatin  (ZOCOR) 20 MG tablet Take 20 mg by mouth at bedtime.    Yes Historical Provider, MD  tiZANidine (ZANAFLEX) 4 MG tablet Take 4 mg by mouth every 8 (eight) hours as needed for muscle spasms.   Yes Historical Provider, MD  triamcinolone cream (KENALOG) 0.1 % Apply 1 application topically 2 (two) times daily as needed (for rash).  11/30/14  Yes Historical Provider, MD  Vitamin D, Ergocalciferol, (DRISDOL) 50000 UNITS CAPS capsule Take 50,000 Units by mouth every 7 (seven) days.   Yes Historical Provider, MD  promethazine (PHENERGAN) 25 MG tablet Take 1 tablet (25  mg total) by mouth every 8 (eight) hours as needed for nausea or vomiting. Patient not taking: Reported on 03/01/2015 12/05/14   Dalia Heading, PA-C  senna-docusate (SENOKOT-S) 8.6-50 MG per tablet Take 1 tablet by mouth at bedtime as needed for mild constipation. Patient not taking: Reported on 03/01/2015 02/18/15   Silver Huguenin Elgergawy, MD   BP 122/78 mmHg  Pulse 85  Temp(Src) 97.8 F (36.6 C) (Oral)  Resp 18  SpO2 98% Physical Exam  Constitutional: She is oriented to person, place, and time. She appears well-developed and well-nourished. No distress.  HENT:  Head: Normocephalic and atraumatic.  Eyes: EOM are normal.  Neck: Normal range of motion.  Cardiovascular: Normal rate, regular rhythm and normal heart sounds.   Pulmonary/Chest: Effort normal and breath sounds normal.  Abdominal: Soft. She exhibits no distension. There is no tenderness.  Musculoskeletal: Normal range of motion.  Neurological: She is alert and oriented to person, place, and time.  Skin: Skin is warm and dry.  Psychiatric: She has a normal mood and affect. Judgment normal.  Nursing note and vitals reviewed.   ED Course  Procedures (including critical care time) Labs Review Labs Reviewed - No data to display  Imaging Review    EKG Interpretation None      MDM   Final diagnoses:  Metastatic cancer to bone  Bone pain    Pain treated. Will  follow up with her primary care MD about changing her pain medication to better control her pain   [name] ambulatory in the emergency department would like to go home at this time.  She received her IM Dilaudid.  No change in her pain medications made here.  I have asked that she speak with her primary prescribing team.  Jola Schmidt, MD 03/03/15 1344

## 2015-03-03 NOTE — ED Notes (Signed)
rn explained to pt that she would be getting a shot of dilaudid, pt stated "that wont help me". rn asked if pt wanted the medicine. Pt said "yes, but I won't feel that medicine till tomorrow". rn explained pt should start having some relief in 30 minutes. rn told pt she would come back and check pts pain level in 15-20 minutes. Pt stated she wasn't going to be here for that long. Pt mumbling about how she wants to leave now, pt stating "treating me out like an old dog". Pt requested for rn to take bp cuff and pulse ox off. Pt stating she is ready to leave.

## 2015-03-03 NOTE — ED Notes (Signed)
Pt refused to let rn take vital signs. Pt states she "has no pain, no pain". Pt refused to e-sign.

## 2015-03-03 NOTE — Telephone Encounter (Signed)
Talked with pt this am & given information regarding CT bx.  She is presently in the ED @ [facility] with pain.

## 2015-03-04 ENCOUNTER — Encounter (HOSPITAL_COMMUNITY): Payer: Self-pay

## 2015-03-04 ENCOUNTER — Ambulatory Visit (HOSPITAL_COMMUNITY)
Admission: RE | Admit: 2015-03-04 | Discharge: 2015-03-04 | Disposition: A | Discharge status: Home / Self Care | Payer: Medicare HMO | Source: Ambulatory Visit | Attending: Hematology | Admitting: Hematology

## 2015-03-04 DIAGNOSIS — Z87891 Personal history of nicotine dependence: Secondary | ICD-10-CM | POA: Insufficient documentation

## 2015-03-04 DIAGNOSIS — C3411 Malignant neoplasm of upper lobe, right bronchus or lung: Secondary | ICD-10-CM | POA: Insufficient documentation

## 2015-03-04 DIAGNOSIS — C7951 Secondary malignant neoplasm of bone: Secondary | ICD-10-CM | POA: Insufficient documentation

## 2015-03-04 LAB — CBC
HCT: 39.6 % (ref 36.0–46.0)
Hemoglobin: 12.8 g/dL (ref 12.0–15.0)
MCH: 29.6 pg (ref 26.0–34.0)
MCHC: 32.3 g/dL (ref 30.0–36.0)
MCV: 91.7 fL (ref 78.0–100.0)
PLATELETS: 182 10*3/uL (ref 150–400)
RBC: 4.32 MIL/uL (ref 3.87–5.11)
RDW: 14 % (ref 11.5–15.5)
WBC: 7.1 10*3/uL (ref 4.0–10.5)

## 2015-03-04 LAB — PROTIME-INR
INR: 1.15 (ref 0.00–1.49)
Prothrombin Time: 14.8 seconds (ref 11.6–15.2)

## 2015-03-04 LAB — GLUCOSE, CAPILLARY: Glucose-Capillary: 118 mg/dL — ABNORMAL HIGH (ref 70–99)

## 2015-03-04 LAB — APTT: aPTT: 30 seconds (ref 24–37)

## 2015-03-04 MED ORDER — MIDAZOLAM HCL 2 MG/2ML IJ SOLN
INTRAMUSCULAR | Status: AC
Start: 2015-03-04 — End: 2015-03-04
  Filled 2015-03-04: qty 6

## 2015-03-04 MED ORDER — MIDAZOLAM HCL 2 MG/2ML IJ SOLN
INTRAMUSCULAR | Status: AC | PRN
  Administered 2015-03-04 (×2): 0.5 mg via INTRAVENOUS
  Administered 2015-03-04: 1 mg via INTRAVENOUS

## 2015-03-04 MED ORDER — SODIUM CHLORIDE 0.9 % IV SOLN: INTRAVENOUS | Status: DC

## 2015-03-04 MED ORDER — HYDROCODONE-ACETAMINOPHEN 10-325 MG PO TABS
1.0000 | ORAL_TABLET | Freq: Once | ORAL | Status: AC
  Administered 2015-03-04: 1 via ORAL
  Filled 2015-03-04 (×2): qty 1

## 2015-03-04 MED ORDER — FENTANYL CITRATE 0.05 MG/ML IJ SOLN
INTRAMUSCULAR | Status: AC | PRN
  Administered 2015-03-04: 25 ug via INTRAVENOUS
  Administered 2015-03-04: 50 ug via INTRAVENOUS
  Administered 2015-03-04: 25 ug via INTRAVENOUS

## 2015-03-04 MED ORDER — FENTANYL CITRATE 0.05 MG/ML IJ SOLN
INTRAMUSCULAR | Status: AC
  Filled 2015-03-04: qty 4

## 2015-03-04 NOTE — Progress Notes (Signed)
[name] complaining of left back and hip pain, her behavior is agitated.  [name] [name] is asking for stronger pain medication, she states her hydrocodone isn't strong enough.  Dr. Pascal Lux agreed to order dose as taken at home.  See MAR.

## 2015-03-04 NOTE — Progress Notes (Signed)
Pt's daughter, [name],  is here to take patient home.  Pt is still agitated, speaking over anyone who is talking.  She is agitated and appreciative to me at the same time.  Discharge instructions given to pt and daughter at the same time.  Pt's daughter, [name], asking some questions to help her mother and pt continues to speak very loud over her.

## 2015-03-04 NOTE — Discharge Instructions (Signed)
Bone Biopsy, Needle, Care After Read the instructions outlined below and refer to this sheet in the next few weeks. These discharge instructions provide you with general information on caring for yourself after you leave the hospital. Your caregiver may also give you specific instructions. While your treatment has been planned according to the most current medical practices available, unavoidable complications sometimes occur. If you have any problems or questions after discharge, call your caregiver. Finding out the results of your test Not all test results are available during your visit. If your test results are not back during the visit, make an appointment with your caregiver to find out the results. Do not assume everything is normal if you have not heard from your caregiver or the medical facility. It is important for you to follow up on all of your test results.  SEEK MEDICAL CARE IF:   You have redness, swelling, or increasing pain at the site of the biopsy.  You have pus coming from the biopsy site.  You have drainage from the biopsy site lasting longer than 1 day.  You notice a bad smell coming from the biopsy site or dressing.  You develop persistent nausea or vomiting. SEEK IMMEDIATE MEDICAL CARE IF:  You have a fever.  You develop a rash.  You have difficulty breathing.  You develop any reaction or side effects to medicines given. Document Released: 05/26/2005 Document Revised: 08/27/2013 Document Reviewed: 04/13/2009 [facility] Patient Information 2015 [place], [place]. This information is not intended to replace advice given to you by your health care provider. Make sure you discuss any questions you have with your health care provider. Bone Biopsy, Needle A bone biopsy is a minor surgical procedure in which a small sample of bone is removed. The sample is taken with a needle. The bone sample is then looked at under a microscope by a specialist. The sample is usually taken  from a bone close to the skin. This test is often done to identify a problem found on X-rays or with blood tests. It may be used to make sure something seen in the bone is not cancer or another problem that needs treatment. It can help diagnose problems such as:  A tumor of the bone marrow (multiple myeloma).  Bone that forms abnormally (Paget's disease).  Non-cancerous (benign) bone cysts.  Bony growths.  Infections in the bone.  Other bone marrow conditions. LET YOUR CAREGIVER KNOW ABOUT:  Previous problems with anesthetics or medicines used to numb the skin.  Allergies to dyes, iodine, foods, or latex.  Medicines taken including herbs, eye drops, prescription medicines (especially medicines used to "thin the blood"), aspirin, other over-the-counter medicines, and steroids (by mouth or as a cream).  History of bleeding or blood problems.  Possibility of pregnancy, if this applies.  History of blood clots in your legs or lungs.  Previous surgery.  Other important health problems. RISKS AND COMPLICATIONS All surgery is associated with risks. Some of these risks are:  Excessive bleeding.  Infection.  Injury to surrounding tissue. BEFORE THE PROCEDURE  Ask your caregiver how long to withhold aspirin or blood thinners prior to having the procedure.  Do not eat or drink anything after midnight the night before the biopsy.  Let your caregiver know if you develop a cold or other infectious problem prior to surgery.  You should arrive 60 minutes prior to your procedure or as directed. PROCEDURE   The sample of bone is removed by putting a large needle through  the skin and into the bone.  Bone biopsies are frequently done under local anesthesia. Sometimes sedatives are also given to keep you relaxed or even asleep during the procedure. Finding out the results of your test Not all test results are available during your visit. If your test results are not back during the  visit, make an appointment with your caregiver to find out the results. Do not assume everything is normal if you have not heard from your caregiver or the medical facility. It is important for you to follow up on all of your test results.  SEEK MEDICAL CARE IF:   You have redness, swelling, or increasing pain in the biopsy site.  You have pus coming from the biopsy site.  You have drainage from the site lasting longer than 1 to 2 days.  You notice a bad smell coming from the biopsy site or dressing.  You have a breaking open of the site (edges not staying together) after sutures have been removed.  You develop persistent nausea or vomiting. SEEK IMMEDIATE MEDICAL CARE IF:   You have a fever.  You develop a rash.  You have difficulty breathing.  You develop any reaction or side effects to medicines given. Document Released: 09/14/2004 Document Revised: 01/29/2012 Document Reviewed: 04/14/2009 [facility] Patient Information 2015 [place], [place]. This information is not intended to replace advice given to you by your health care provider. Make sure you discuss any questions you have with your health care provider. Needle Biopsy Care After These instructions give you information on caring for yourself after your procedure. Your doctor may also give you more specific instructions. Call your doctor if you have any problems or questions after your procedure. HOME CARE  Rest for 4 hours after your biopsy, except for getting up to go to the bathroom or as told.  Keep the places where the needles were put in clean and dry.  Do not put powder or lotion on the sites.  Do not shower until 24 hours after the test. Remove all bandages (dressings) before showering.  Remove all bandages at least once every day. Gently clean the sites with soap and water. Keep putting a new bandage on until the skin is closed. Finding out the results of your test Ask your doctor when your test results will be  ready. Make sure you follow up and get the test results. GET HELP RIGHT AWAY IF:   You have shortness of breath or trouble breathing.  You have pain or cramping in your belly (abdomen).  You feel sick to your stomach (nauseous) or throw up (vomit).  Any of the places where the needles were put in:  Are puffy (swollen) or red.  Are sore or hot to the touch.  Are draining yellowish-white fluid (pus).  Are bleeding after 10 minutes of pressing down on the site. Have someone keep pressing on any place that is bleeding until you see a doctor.  You have any unusual pain that will not stop.  You have a fever. If you go to the emergency room, tell the nurse that you had a biopsy. Take this paper with you to show the nurse. MAKE SURE YOU:   Understand these instructions.  Will watch your condition.  Will get help right away if you are not doing well or get worse. Document Released: 10/19/2008 Document Revised: 01/29/2012 Document Reviewed: 10/19/2008 [facility] Patient Information 2015 [place], [place]. This information is not intended to replace advice given to you by your  health care provider. Make sure you discuss any questions you have with your health care provider. Conscious Sedation, Adult, Care After Refer to this sheet in the next few weeks. These instructions provide you with information on caring for yourself after your procedure. Your health care provider may also give you more specific instructions. Your treatment has been planned according to current medical practices, but problems sometimes occur. Call your health care provider if you have any problems or questions after your procedure. WHAT TO EXPECT AFTER THE PROCEDURE  After your procedure:  You may feel sleepy, clumsy, and have poor balance for several hours.  Vomiting may occur if you eat too soon after the procedure. HOME CARE INSTRUCTIONS  Do not participate in any activities where you could become injured for at  least 24 hours. Do not:  Drive.  Swim.  Ride a bicycle.  Operate heavy machinery.  Cook.  Use power tools.  Climb ladders.  Work from a high place.  Do not make important decisions or sign legal documents until you are improved.  If you vomit, drink water, juice, or soup when you can drink without vomiting. Make sure you have little or no nausea before eating solid foods.  Only take over-the-counter or prescription medicines for pain, discomfort, or fever as directed by your health care provider.  Make sure you and your family fully understand everything about the medicines given to you, including what side effects may occur.  You should not drink alcohol, take sleeping pills, or take medicines that cause drowsiness for at least 24 hours.  If you smoke, do not smoke without supervision.  If you are feeling better, you may resume normal activities 24 hours after you were sedated.  Keep all appointments with your health care provider. SEEK MEDICAL CARE IF:  Your skin is pale or bluish in color.  You continue to feel nauseous or vomit.  Your pain is getting worse and is not helped by medicine.  You have bleeding or swelling.  You are still sleepy or feeling clumsy after 24 hours. SEEK IMMEDIATE MEDICAL CARE IF:  You develop a rash.  You have difficulty breathing.  You develop any type of allergic problem.  You have a fever. MAKE SURE YOU:  Understand these instructions.  Will watch your condition.  Will get help right away if you are not doing well or get worse. Document Released: 08/27/2013 Document Reviewed: 08/27/2013 [facility] Patient Information 2015 [place], [place]. This information is not intended to replace advice given to you by your health care provider. Make sure you discuss any questions you have with your health care provider.

## 2015-03-04 NOTE — H&P (Signed)
Referring Physician(s): Feng,Yan  History of Present Illness: [name] [name] is a 64 y.o. female who has been seen by Dr. Burr Medico on 02/11/15 with suspicion for metastatic cancer with diffuse bone lesions. The patient has a history of right lung cancer adenocarcinoma s/p lobectomy 2014 and has been scheduled today for image guided left iliac bone lesion biopsy. She denies any chest pain, shortness of breath or palpitations. She denies any active signs of bleeding or excessive bruising. The patient denies any history of sleep apnea or chronic oxygen use. She has previously tolerated sedation without complications. The patient has had a H&P performed within the last 30 days, all history, medications, and exam have been reviewed. The patient denies any interval changes since the H&P.   Past Medical History  Diagnosis Date  . Hypertension   . Bipolar 1 disorder   . Heart murmur   . Sciatica   . Arthritis   . Anxiety   . Diverticulosis   . Chronic low back pain   . DDD (degenerative disc disease), lumbar   . Chronic shoulder pain   . Chronic neck pain   . Hepatitis B     Unclear when initially diagnosed, labs in Epic from 03/09/13  . Hepatitis C     Unclear when initially diagnosed, labs in Epic from 03/09/13  . C. difficile diarrhea     April and February 2014  . COPD (chronic obstructive pulmonary disease)   . GERD (gastroesophageal reflux disease)   . H/O hiatal hernia   . Headache(784.0)     hx  . PONV (postoperative nausea and vomiting)   . Seizures     on meds  . Diabetes mellitus   . Adenocarcinoma of lung, stage 1     Past Surgical History  Procedure Laterality Date  . Facial tumor removal Right 2000  . Brain tumor removal  09  . Brain surgery      in [place]  . Video bronchoscopy  12/04/2012    Procedure: VIDEO BRONCHOSCOPY;  Surgeon: Grace Isaac, MD;  Location: [facility] OR;  Service: Thoracic;  Laterality: N/A;  . Video assisted thoracoscopy (vats)/wedge  resection  12/04/2012    Procedure: VIDEO ASSISTED THORACOSCOPY (VATS)/WEDGE RESECTION;  Surgeon: Grace Isaac, MD;  Location: [place];  Service: Thoracic;  Laterality: Right;  . Lobectomy  12/04/2012    Procedure: LOBECTOMY;  Surgeon: Grace Isaac, MD;  Location: [place];  Service: Thoracic;  Laterality: Right;  completion of right upper lobectomy and lymph node disection, placement of on q pump  . Tubal ligation    . Tonsillectomy    . Anterior cervical decomp/discectomy fusion N/A 03/05/2014    Procedure: ANTERIOR CERVICAL DECOMPRESSION/DISCECTOMY FUSION 2 LEVELS;  Surgeon: Sinclair Ship, MD;  Location: [place];  Service: Orthopedics;  Laterality: N/A;  Anterior cervical decompression fusion, cervical 4-5, cervical 5-6 with instrumentation, allograft.    Allergies: Haldol; Acetaminophen; Ibuprofen; Metronidazole; Morphine and related; and Penicillins  Medications: Prior to Admission medications   Medication Sig Start Date End Date Taking? Authorizing Provider  ALPRAZolam Duanne Moron) 1 MG tablet Take 0.5 mg by mouth 2 (two) times daily.    Yes Historical Provider, MD  HYDROcodone-acetaminophen (NORCO) 10-325 MG per tablet Take 1 tablet by mouth 3 (three) times daily.   Yes Historical Provider, MD  Cyanocobalamin (VITAMIN B-12 IJ) Inject as directed every 30 (thirty) days. Administered by Dr. York Ram around the 1st of each month    Historical Provider,  MD  dicyclomine (BENTYL) 20 MG tablet Take 20 mg by mouth 3 (three) times daily with meals.    Historical Provider, MD  gabapentin (NEURONTIN) 400 MG capsule Take 400 mg by mouth 3 (three) times daily. Nerve pain    Historical Provider, MD  hydrOXYzine (VISTARIL) 25 MG capsule Take 25 mg by mouth daily.    Historical Provider, MD  Ipratropium-Albuterol (COMBIVENT RESPIMAT) 20-100 MCG/ACT AERS respimat Inhale 2 puffs into the lungs daily as needed for wheezing or shortness of breath.     Historical Provider, MD  KLOR-CON M20 20 MEQ  tablet Take 20 mEq by mouth every morning. with food 01/04/15   Historical Provider, MD  loratadine (CLARITIN) 10 MG tablet Take 10 mg by mouth every morning.     Historical Provider, MD  Menthol-Methyl Salicylate (MUSCLE RUB) 10-15 % CREA Apply 1 application topically daily as needed for muscle pain. Left shoulder pain    Historical Provider, MD  metFORMIN (GLUCOPHAGE) 500 MG tablet Take 500 mg by mouth 2 (two) times daily with a meal.    Historical Provider, MD  metoprolol tartrate (LOPRESSOR) 25 MG tablet Take 25 mg by mouth 2 (two) times daily.  11/24/13   Historical Provider, MD  olmesartan-hydrochlorothiazide (BENICAR HCT) 40-25 MG per tablet Take 1 tablet by mouth every morning.     Historical Provider, MD  omeprazole (PRILOSEC) 20 MG capsule Take 20 mg by mouth 2 (two) times daily before a meal.  01/16/14   Historical Provider, MD  ondansetron (ZOFRAN-ODT) 8 MG disintegrating tablet Take 1 tablet by mouth every 6 (six) hours as needed for nausea or vomiting.  03/02/15   Historical Provider, MD  OXcarbazepine (TRILEPTAL) 150 MG tablet Take 150 mg by mouth 3 (three) times daily.  11/24/13   Historical Provider, MD  polyethylene glycol (MIRALAX / GLYCOLAX) packet Take 17 g by mouth 2 (two) times daily as needed for mild constipation or moderate constipation.    Historical Provider, MD  promethazine (PHENERGAN) 25 MG tablet Take 1 tablet (25 mg total) by mouth every 8 (eight) hours as needed for nausea or vomiting. Patient not taking: Reported on 03/01/2015 12/05/14   Dalia Heading, PA-C  senna-docusate (SENOKOT-S) 8.6-50 MG per tablet Take 1 tablet by mouth at bedtime as needed for mild constipation. Patient not taking: Reported on 03/01/2015 02/18/15   Silver Huguenin Elgergawy, MD  simvastatin (ZOCOR) 20 MG tablet Take 20 mg by mouth at bedtime.     Historical Provider, MD  tiZANidine (ZANAFLEX) 4 MG tablet Take 4 mg by mouth every 8 (eight) hours as needed for muscle spasms.    Historical Provider, MD    triamcinolone cream (KENALOG) 0.1 % Apply 1 application topically 2 (two) times daily as needed (for rash).  11/30/14   Historical Provider, MD  Vitamin D, Ergocalciferol, (DRISDOL) 50000 UNITS CAPS capsule Take 50,000 Units by mouth every 7 (seven) days.    Historical Provider, MD     Family History  Problem Relation Age of Onset  . Hypertension Father     deceased  . Diabetes Father   . Heart disease Father   . Hyperlipidemia Father   . Hypertension Mother   . Hyperlipidemia Mother   . Diabetes Mother   . Hypertension Sister   . Hyperlipidemia Sister   . Hypertension Brother   . Hyperlipidemia Brother     History   Social History  . Marital Status: Widowed    Spouse Name: N/A  . Number of Children:  N/A  . Years of Education: N/A   Occupational History  . n/a     patient draws [employer]   Social History Main Topics  . Smoking status: Former Smoker -- 0.25 packs/day for 10 years    Types: Cigarettes    Quit date: 07/07/2013  . Smokeless tobacco: Never Used     Comment: Smokes pk q 3 days   marajuna aug  . Alcohol Use: No     Comment: quit 56yrs ago  . Drug Use: Yes    Special: Hydrocodone, Marijuana     Comment: "maybe about once a month"  . Sexual Activity: No   Other Topics Concern  . None   Social History Narrative    Review of Systems: A 12 point ROS discussed and pertinent positives are indicated in the HPI above.  All other systems are negative.  Review of Systems  Vital Signs: BP 122/76 mmHg  Pulse 86  Temp(Src) 98.7 F (37.1 C) (Oral)  Resp 18  SpO2 99%  Physical Exam  Constitutional: She is oriented to person, place, and time. No distress.  HENT:  Head: Normocephalic and atraumatic.  Neck: No tracheal deviation present.  Cardiovascular: Normal rate and regular rhythm.  Exam reveals no gallop and no friction rub.   Murmur heard. Pulmonary/Chest: Effort normal and breath sounds normal. No respiratory distress. She has no wheezes. She has no  rales.  Abdominal: Soft. Bowel sounds are normal. She exhibits no distension. There is no tenderness.  Neurological: She is alert and oriented to person, place, and time.  Skin: She is not diaphoretic.    Mallampati Score:  MD Evaluation Airway: WNL Heart: WNL Abdomen: WNL Chest/ Lungs: WNL ASA  Classification: 3 Mallampati/Airway Score: Two  Imaging: Dg Chest 2 View  02/17/2015   CLINICAL DATA:  Abdominal pain and emesis  EXAM: CHEST  2 VIEW  COMPARISON:  03/05/2014  FINDINGS: Normal heart size and stable aortic and hilar contours. There is unchanged distortion of right hilar architecture related to upper lobectomy. There is no edema, consolidation, effusion, or pneumothorax. Mild thoracic dextroscoliosis. No acute osseous findings.  IMPRESSION: 1. No active cardiopulmonary disease. 2. Right upper lobectomy.   Electronically Signed   By: Monte Fantasia M.D.   On: 02/17/2015 07:17   Ct Chest Wo Contrast  02/24/2015   CLINICAL DATA:  Right upper lobectomy for cancer. MRI on 02/02/2015 showed diffuse osseous metastatic disease.  EXAM: CT CHEST WITHOUT CONTRAST  TECHNIQUE: Multidetector CT imaging of the chest was performed following the standard protocol without IV contrast.  COMPARISON:  Multiple exams, including 02/17/2015 and 09/10/2014  FINDINGS: Mediastinum/Nodes: Right upper paratracheal lymph node 0.8 cm on image 10, series 2, not well shown previously. Currently no pathologic thoracic adenopathy is identified. Thyroid gland unremarkable. Aortic atherosclerotic calcification noted.  Lungs/Pleura: Postoperative findings right upper lobectomy. Emphysema. Minimal lingular scarring and minimal left lower lobe scarring anteriorly. No recurrent lung mass. No pleural mass identified.  6 mm ground-glass density nodule on image 22 of series 603 in the left lower lobe, equivocally questionably enlarging compared to 12/28/12.  Upper abdomen: Mild extrahepatic biliary dilatation, up to 9 mm, significance  uncertain. This is not dissimilar to prior exams. Gastrohepatic ligament lymph node 0.9 cm in short axis, image 54 series 2. (Previously the same).  Musculoskeletal: Widespread osseous metastatic disease primarily manifesting as multifocal variable density (some sclerotic, some lytic) lesions less readily seen in the spine and sternum, although some slight rib endosteal irregularities can also  be appreciated on care for review. A manubrial lucent lesion in the sternum measures 2.0 by 1.0 cm on image 49 of series 603, and was not present at all on the prior chest CT. The spine lesions and other bony lesions are also not readily apparent on prior CT, indicating a rapid onset. An index lytic lesion in the T5 vertebral body measures 1.5 by 1.1 by 1.3 cm, and all the though the base of vertebral plexus extends towards this lesion I do not see definite extension outside of the vertebral body.  IMPRESSION: 1. No compelling new pulmonary mass is identified as a primary site for the diffuse osseous metastatic disease. However, new osseous metastatic lesions are present throughout the thoracic spine, sternum, and some of the ribs. 2. Very faint ground-glass density 5 mm pulmonary nodule in the left lower lobe, questionable change compared back through 12/28/2012. This may merit observation.   Electronically Signed   By: Van Clines M.D.   On: 02/24/2015 21:39   Mr Brain Wo Contrast  03/01/2015   CLINICAL DATA:  Osseous metastatic disease. Prior right upper lobectomy for lung cancer.  EXAM: MRI HEAD WITHOUT CONTRAST  TECHNIQUE: Multiplanar, multiecho pulse sequences of the brain and surrounding structures were obtained without intravenous contrast.  COMPARISON:  08/27/2012  FINDINGS: IV access could not be obtained despite multiple attempts by the IV team. Contrast could therefore not be administered. Coronal T2 weighted images were not performed as the patient was in pain and could not tolerate further imaging.   Bone marrow signal of the skull and visualized upper cervical spine is diffusely heterogeneous consistent with known widespread osseous metastases. A left parietal skull metastasis extends through the inner table and may involve the dura (series 9, image 34).  Sequelae of prior left frontal craniotomy are again identified. There is underlying left frontal lobe encephalomalacia with a small amount of chronic blood products present. Confluent T2 hyperintensity in the left frontal white matter in this region is unchanged and presumably reflects gliosis. There is no evidence of acute infarct, midline shift, or extra-axial fluid collection. There is mild generalized cerebral atrophy, within normal limits for age. Scattered, small foci of T2 hyperintensity in the cerebral white matter are similar to the prior MRI and nonspecific but compatible with mild chronic small vessel ischemic disease. No parenchymal brain mass identified, however evaluation is limited by lack of IV contrast.  Orbits are unremarkable. Paranasal sinuses are clear. No significant mastoid effusion is seen. Major intracranial vascular flow voids are preserved, with the distal right vertebral artery appearing hypoplastic.  IMPRESSION: 1. 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 2. No parenchymal brain mass identified, however evaluation for small lesions is limited by lack of IV contrast. Postcontrast brain MRI is recommended when/if IV access is obtained. 3. Unchanged left frontal encephalomalacia.   Electronically Signed   By: Logan Bores   On: 03/01/2015 17:09   Mr Lumbar Spine Wo Contrast  02/02/2015   CLINICAL DATA:  64 year old female with unexplained lumbar back pain. Personal history of right lung cancer in 2013. Current history of hepatitis.  EXAM: MRI LUMBAR SPINE WITHOUT CONTRAST  TECHNIQUE: Multiplanar, multisequence MR imaging of the lumbar spine was performed. No intravenous contrast  was administered.  COMPARISON:  CT Abdomen and Pelvis 12/05/2014 and earlier. [facility] lumbar MRI 03/01/2013.  FINDINGS: New since 2014 numerous T1 hypo intense T2 heterogeneous and STIR hyperintense bone lesions throughout the visible skeleton, including  the lower thoracic spine, lumbar spine, sacrum, and visible pelvis. Complete replacement of mid the T12 vertebral body marrow with subtle ventral epidural extension (series 800, image 6). No pathologic fracture identified. Most other visible lesions are 6-12 mm diameter.  No lumbar epidural or foraminal tumor identified.  Visualized lower thoracic spinal cord is normal with conus medularis at L1-L2. Normal noncontrast appearance of the cauda equina nerve roots.  Stable visualized abdominal viscera.  Preexisting superimposed L5-S1 postoperative changes in the interbody space and on the left. No significant degenerative lumbar spinal stenosis.  IMPRESSION: 1. Diffuse osseous metastatic disease. 2. Near complete replacement of the T12 vertebral body by tumor. Subtle ventral epidural tumor extension. No pathologic fracture or malignant neural impingement in the visualized spine. 3. Normal noncontrast appearance of the visualized lower spinal cord, and cauda equina   Electronically Signed   By: Genevie Ann M.D.   On: 02/02/2015 17:56   Ct Abdomen Pelvis W Contrast  02/17/2015   CLINICAL DATA:  Acute abdominal pain.  History lung cancer  EXAM: CT ABDOMEN AND PELVIS WITH CONTRAST  TECHNIQUE: Multidetector CT imaging of the abdomen and pelvis was performed using the standard protocol following bolus administration of intravenous contrast.  CONTRAST:  119mL OMNIPAQUE IOHEXOL 300 MG/ML  SOLN  COMPARISON:  CT abdomen 12/05/2014  FINDINGS: Lung bases are clear.  Hiatal hernia noted.  Normal liver. No liver mass lesion. Gallbladder normal. Common bile duct measures approximately 11 mm in diameter and is mildly enlarged. Correlate with liver function  tests. Common bile duct slightly more prominent compared with the prior CT 12/05/2014. Pancreatic duct nondilated. No pancreatic mass. Negative for pancreatitis. Normal spleen.  Normal kidneys. No renal mass or obstruction. No renal calculi. Urinary bladder normal. Normal uterus which is retroverted.  Negative for bowel obstruction. No bowel thickening. Normal appendix.  Negative for free fluid.  No adenopathy identified.  Widespread bony lesions compatible with metastatic disease. These lesions are mostly sclerotic but there are some lesions which are lytic including the left posterior iliac wing. Bone lesions show rapid progression since the CT of 12/05/2014. Similar appearance on the MRI of 02/02/2015. Given history of stage I lung cancer, metastatic lung cancer is possible however also consider metastatic breast cancer. No pathologic fracture. Prior surgical fusion L5-S1 with pseudarthrosis and grade 1 anterior slip.  IMPRESSION: Common bile duct dilated 11 mm. Common duct stone not excluded. No liver lesions  Widespread bony metastatic disease, with rapid progression since 12/05/2014. Given the sclerotic appearance of these lesions, consider breast cancer is well as metastatic lung cancer.   Electronically Signed   By: Franchot Gallo M.D.   On: 02/17/2015 10:15   Dg Esophagus  02/18/2015   CLINICAL DATA:  64 year old female with history of dysphagia, complaining that solid foods get stuck for the past 5 months.  EXAM: ESOPHOGRAM/BARIUM SWALLOW  TECHNIQUE: Combined double contrast and single contrast examination performed using effervescent crystals, thick barium liquid, and thin barium liquid.  FLUOROSCOPY TIME:  If the device does not provide the exposure index:  Fluoroscopy Time:  3 minutes and 27 seconds.  Number of Acquired Images:  39  COMPARISON:  No priors.  FINDINGS: Initial double contrast images of the esophagus demonstrated a normal appearance of the esophageal mucosa. Multiple single swallow  attempts were observed, with incomplete propagation of the primary peristaltic waves on every attempt. This was of variable severity, with various degrees of proximal escape ranging from moderate to mild. No tertiary contractions were noted.  Full column esophagram demonstrated no esophageal mass, stricture or esophageal ring. No hiatal hernia. No gastroesophageal reflux was observed during a water siphon test. A barium tablet was administered, which passed readily into the stomach.  IMPRESSION: 1. Nonspecific esophageal motility disorder, as above. 2. The esophagus is structurally normal.   Electronically Signed   By: Vinnie Langton M.D.   On: 02/18/2015 15:46   US Abdomen Limited Ruq  02/17/2015   CLINICAL DATA:  Generalized abdominal pain  EXAM: US ABDOMEN LIMITED - RIGHT UPPER QUADRANT  COMPARISON:  None.  FINDINGS: Gallbladder:  No gallstones or wall thickening visualized. No sonographic Murphy sign noted.  Common bile duct:  Diameter: 5.2 mm  Liver:  No focal lesion identified. Within normal limits in parenchymal echogenicity.  IMPRESSION: Normal   Electronically Signed   By: Franchot Gallo M.D.   On: 02/17/2015 14:20    Labs:  CBC:  Recent Labs  02/11/15 1610 02/17/15 0749 02/18/15 0430 03/04/15 1028  WBC 7.5 7.0 7.9 7.1  HGB 12.6 14.1 13.0 12.8  HCT 39.1 42.0 39.6 39.6  PLT 246 229 206 182    COAGS:  Recent Labs  02/11/15 1610  INR 1.15  APTT 31    BMP:  Recent Labs  12/05/14 1122 12/29/14 1201 02/17/15 0749 02/18/15 0430  NA 139 139 138 142  K 3.9 3.7 3.0* 3.7  CL 101 105 97 107  CO2 26 23 27 26   GLUCOSE 89 96 144* 87  BUN 14 15 14 12   CALCIUM 9.8 9.4 9.7 8.8  CREATININE 0.77 0.59 0.65 0.57  GFRNONAA 86* >90 >90 >90  GFRAA >90 >90 >90 >90    LIVER FUNCTION TESTS:  Recent Labs  11/27/14 1150 12/05/14 1122 12/29/14 1201 02/17/15 0749  BILITOT 0.6 0.7 0.8 0.8  AST 21 26 20 26   ALT 13 13 13 14   ALKPHOS 91 88 104 198*  PROT 6.7 7.1 6.7 8.0  ALBUMIN  3.9 4.1 3.9 4.5   Assessment and Plan: Suspicion for metastatic cancer with diffuse bone lesions History of right lung cancer adenocarcinoma s/p lobectomy 2014 Seen by Dr. Burr Medico 02/11/15  Scheduled today for image guided left iliac bone lesion biopsy with moderate sedation The patient has been NPO, no blood thinners taken, labs and vitals have been reviewed. Risks and Benefits discussed with the patient including, but not limited to bleeding, infection, damage to adjacent structures or low yield requiring additional tests. All of the patient's questions were answered, patient is agreeable to proceed. Consent signed and in chart.   SignedHedy Jacob 03/04/2015, 11:15 AM

## 2015-03-04 NOTE — Procedures (Signed)
Technically successful CT guided biopsy of lytic lesion involving the posterior aspect of the left iliac crest.  No immediate post procedural complications.

## 2015-03-05 ENCOUNTER — Telehealth: Payer: Self-pay | Admitting: *Deleted

## 2015-03-05 NOTE — Telephone Encounter (Signed)
TC from patient regarding scheduling PAC or PICC line insertion. Pt is unsure of who she should call or who should be calling her for this. Please advise

## 2015-03-05 NOTE — Telephone Encounter (Signed)
I spoke with the pt, she agreed with [facility]. Will contact IR on Monday to see if they can place a port before her PET on 4/20.   Truitt Merle  03/05/2015

## 2015-03-08 ENCOUNTER — Emergency Department (HOSPITAL_COMMUNITY)
Admission: EM | Admit: 2015-03-08 | Discharge: 2015-03-08 | Disposition: A | Discharge status: Home / Self Care | Payer: Medicare HMO | Source: Home / Self Care | Attending: Emergency Medicine | Admitting: Emergency Medicine

## 2015-03-08 ENCOUNTER — Inpatient Hospital Stay (HOSPITAL_COMMUNITY)
Admission: RE | Admit: 2015-03-08 | Discharge: 2015-03-12 | DRG: 543 | Disposition: A | Discharge status: Home / Self Care | Payer: Medicare HMO | Source: Ambulatory Visit | Attending: Family Medicine | Admitting: Family Medicine

## 2015-03-08 ENCOUNTER — Other Ambulatory Visit: Payer: Self-pay | Admitting: Radiology

## 2015-03-08 ENCOUNTER — Other Ambulatory Visit: Payer: Self-pay

## 2015-03-08 ENCOUNTER — Other Ambulatory Visit: Payer: Self-pay | Admitting: *Deleted

## 2015-03-08 ENCOUNTER — Ambulatory Visit (HOSPITAL_BASED_OUTPATIENT_CLINIC_OR_DEPARTMENT_OTHER): Payer: Medicare HMO | Admitting: Hematology

## 2015-03-08 ENCOUNTER — Encounter: Payer: Self-pay | Admitting: Hematology

## 2015-03-08 ENCOUNTER — Encounter (HOSPITAL_COMMUNITY): Payer: Self-pay | Admitting: Emergency Medicine

## 2015-03-08 ENCOUNTER — Encounter (HOSPITAL_COMMUNITY): Payer: Self-pay

## 2015-03-08 VITALS — BP 172/83 | HR 86 | Temp 98.4°F

## 2015-03-08 DIAGNOSIS — Z515 Encounter for palliative care: Secondary | ICD-10-CM | POA: Diagnosis not present

## 2015-03-08 DIAGNOSIS — Z888 Allergy status to other drugs, medicaments and biological substances status: Secondary | ICD-10-CM

## 2015-03-08 DIAGNOSIS — R1110 Vomiting, unspecified: Secondary | ICD-10-CM | POA: Diagnosis not present

## 2015-03-08 DIAGNOSIS — M1990 Unspecified osteoarthritis, unspecified site: Secondary | ICD-10-CM | POA: Diagnosis present

## 2015-03-08 DIAGNOSIS — C7951 Secondary malignant neoplasm of bone: Secondary | ICD-10-CM

## 2015-03-08 DIAGNOSIS — Z85118 Personal history of other malignant neoplasm of bronchus and lung: Secondary | ICD-10-CM

## 2015-03-08 DIAGNOSIS — R627 Adult failure to thrive: Secondary | ICD-10-CM

## 2015-03-08 DIAGNOSIS — C3490 Malignant neoplasm of unspecified part of unspecified bronchus or lung: Secondary | ICD-10-CM

## 2015-03-08 DIAGNOSIS — R1012 Left upper quadrant pain: Secondary | ICD-10-CM | POA: Insufficient documentation

## 2015-03-08 DIAGNOSIS — E860 Dehydration: Secondary | ICD-10-CM | POA: Diagnosis not present

## 2015-03-08 DIAGNOSIS — K5900 Constipation, unspecified: Secondary | ICD-10-CM | POA: Insufficient documentation

## 2015-03-08 DIAGNOSIS — Z902 Acquired absence of lung [part of]: Secondary | ICD-10-CM | POA: Diagnosis present

## 2015-03-08 DIAGNOSIS — C801 Malignant (primary) neoplasm, unspecified: Secondary | ICD-10-CM | POA: Diagnosis not present

## 2015-03-08 DIAGNOSIS — F411 Generalized anxiety disorder: Secondary | ICD-10-CM | POA: Diagnosis present

## 2015-03-08 DIAGNOSIS — C3411 Malignant neoplasm of upper lobe, right bronchus or lung: Secondary | ICD-10-CM | POA: Diagnosis present

## 2015-03-08 DIAGNOSIS — J449 Chronic obstructive pulmonary disease, unspecified: Secondary | ICD-10-CM | POA: Diagnosis present

## 2015-03-08 DIAGNOSIS — E119 Type 2 diabetes mellitus without complications: Secondary | ICD-10-CM | POA: Diagnosis present

## 2015-03-08 DIAGNOSIS — F319 Bipolar disorder, unspecified: Secondary | ICD-10-CM

## 2015-03-08 DIAGNOSIS — I10 Essential (primary) hypertension: Secondary | ICD-10-CM | POA: Diagnosis not present

## 2015-03-08 DIAGNOSIS — F329 Major depressive disorder, single episode, unspecified: Secondary | ICD-10-CM | POA: Insufficient documentation

## 2015-03-08 DIAGNOSIS — C3410 Malignant neoplasm of upper lobe, unspecified bronchus or lung: Secondary | ICD-10-CM | POA: Diagnosis not present

## 2015-03-08 DIAGNOSIS — B1920 Unspecified viral hepatitis C without hepatic coma: Secondary | ICD-10-CM | POA: Diagnosis present

## 2015-03-08 DIAGNOSIS — Z87891 Personal history of nicotine dependence: Secondary | ICD-10-CM | POA: Insufficient documentation

## 2015-03-08 DIAGNOSIS — F32A Depression, unspecified: Secondary | ICD-10-CM | POA: Insufficient documentation

## 2015-03-08 DIAGNOSIS — Z880 Allergy status to penicillin: Secondary | ICD-10-CM

## 2015-03-08 DIAGNOSIS — Z8619 Personal history of other infectious and parasitic diseases: Secondary | ICD-10-CM | POA: Insufficient documentation

## 2015-03-08 DIAGNOSIS — Z79899 Other long term (current) drug therapy: Secondary | ICD-10-CM | POA: Insufficient documentation

## 2015-03-08 DIAGNOSIS — Z794 Long term (current) use of insulin: Secondary | ICD-10-CM | POA: Diagnosis not present

## 2015-03-08 DIAGNOSIS — R52 Pain, unspecified: Secondary | ICD-10-CM | POA: Diagnosis present

## 2015-03-08 DIAGNOSIS — G893 Neoplasm related pain (acute) (chronic): Secondary | ICD-10-CM | POA: Diagnosis present

## 2015-03-08 DIAGNOSIS — E876 Hypokalemia: Secondary | ICD-10-CM | POA: Diagnosis not present

## 2015-03-08 DIAGNOSIS — K219 Gastro-esophageal reflux disease without esophagitis: Secondary | ICD-10-CM | POA: Diagnosis present

## 2015-03-08 DIAGNOSIS — R011 Cardiac murmur, unspecified: Secondary | ICD-10-CM | POA: Insufficient documentation

## 2015-03-08 DIAGNOSIS — M546 Pain in thoracic spine: Secondary | ICD-10-CM | POA: Insufficient documentation

## 2015-03-08 DIAGNOSIS — R112 Nausea with vomiting, unspecified: Secondary | ICD-10-CM | POA: Diagnosis present

## 2015-03-08 DIAGNOSIS — G8929 Other chronic pain: Secondary | ICD-10-CM | POA: Insufficient documentation

## 2015-03-08 DIAGNOSIS — F419 Anxiety disorder, unspecified: Secondary | ICD-10-CM | POA: Insufficient documentation

## 2015-03-08 DIAGNOSIS — Z885 Allergy status to narcotic agent status: Secondary | ICD-10-CM | POA: Diagnosis not present

## 2015-03-08 DIAGNOSIS — B1910 Unspecified viral hepatitis B without hepatic coma: Secondary | ICD-10-CM | POA: Diagnosis present

## 2015-03-08 DIAGNOSIS — C799 Secondary malignant neoplasm of unspecified site: Secondary | ICD-10-CM | POA: Diagnosis present

## 2015-03-08 DIAGNOSIS — Z7952 Long term (current) use of systemic steroids: Secondary | ICD-10-CM | POA: Insufficient documentation

## 2015-03-08 LAB — CBC WITH DIFFERENTIAL/PLATELET
BASOS PCT: 0 % (ref 0–1)
Basophils Absolute: 0 10*3/uL (ref 0.0–0.1)
Eosinophils Absolute: 0 10*3/uL (ref 0.0–0.7)
Eosinophils Relative: 1 % (ref 0–5)
HEMATOCRIT: 40 % (ref 36.0–46.0)
Hemoglobin: 12.9 g/dL (ref 12.0–15.0)
LYMPHS ABS: 1.3 10*3/uL (ref 0.7–4.0)
Lymphocytes Relative: 18 % (ref 12–46)
MCH: 29 pg (ref 26.0–34.0)
MCHC: 32.3 g/dL (ref 30.0–36.0)
MCV: 89.9 fL (ref 78.0–100.0)
MONO ABS: 0.5 10*3/uL (ref 0.1–1.0)
Monocytes Relative: 7 % (ref 3–12)
Neutro Abs: 5.2 10*3/uL (ref 1.7–7.7)
Neutrophils Relative %: 74 % (ref 43–77)
Platelets: 184 10*3/uL (ref 150–400)
RBC: 4.45 MIL/uL (ref 3.87–5.11)
RDW: 13.6 % (ref 11.5–15.5)
WBC: 7 10*3/uL (ref 4.0–10.5)

## 2015-03-08 LAB — COMPREHENSIVE METABOLIC PANEL
ALT: 12 U/L (ref 0–35)
AST: 24 U/L (ref 0–37)
Albumin: 4 g/dL (ref 3.5–5.2)
Alkaline Phosphatase: 272 U/L — ABNORMAL HIGH (ref 39–117)
Anion gap: 8 (ref 5–15)
BILIRUBIN TOTAL: 0.7 mg/dL (ref 0.3–1.2)
BUN: 13 mg/dL (ref 6–23)
CO2: 26 mmol/L (ref 19–32)
Calcium: 9.4 mg/dL (ref 8.4–10.5)
Chloride: 102 mmol/L (ref 96–112)
Creatinine, Ser: 0.54 mg/dL (ref 0.50–1.10)
GFR calc Af Amer: >90 mL/min (ref >90)
GLUCOSE: 129 mg/dL — AB (ref 70–99)
Potassium: 3.6 mmol/L (ref 3.5–5.1)
SODIUM: 136 mmol/L (ref 135–145)
Total Protein: 7.2 g/dL (ref 6.0–8.3)

## 2015-03-08 LAB — I-STAT CG4 LACTIC ACID, ED: Lactic Acid, Venous: 2.39 mmol/L (ref 0.5–2.0)

## 2015-03-08 LAB — URINALYSIS, ROUTINE W REFLEX MICROSCOPIC
Bilirubin Urine: NEGATIVE
GLUCOSE, UA: NEGATIVE mg/dL
HGB URINE DIPSTICK: NEGATIVE
KETONES UR: NEGATIVE mg/dL
LEUKOCYTES UA: NEGATIVE
Nitrite: NEGATIVE
PROTEIN: NEGATIVE mg/dL
Specific Gravity, Urine: 1.023 (ref 1.005–1.030)
Urobilinogen, UA: 1 mg/dL (ref 0.0–1.0)
pH: 7 (ref 5.0–8.0)

## 2015-03-08 LAB — GLUCOSE, CAPILLARY
GLUCOSE-CAPILLARY: 129 mg/dL — AB (ref 70–99)
Glucose-Capillary: 156 mg/dL — ABNORMAL HIGH (ref 70–99)

## 2015-03-08 LAB — LIPASE, BLOOD: Lipase: 18 U/L (ref 11–59)

## 2015-03-08 MED ORDER — ONDANSETRON HCL 4 MG/2ML IJ SOLN
4.0000 mg | Freq: Four times a day (QID) | INTRAMUSCULAR | Status: DC | PRN
  Administered 2015-03-09: 4 mg via INTRAVENOUS
  Filled 2015-03-08: qty 2

## 2015-03-08 MED ORDER — ALUM & MAG HYDROXIDE-SIMETH 200-200-20 MG/5ML PO SUSP: 30.0000 mL | Freq: Four times a day (QID) | ORAL | Status: DC | PRN

## 2015-03-08 MED ORDER — HYDROMORPHONE HCL 1 MG/ML IJ SOLN
1.0000 mg | Freq: Once | INTRAMUSCULAR | Status: AC
  Administered 2015-03-08: 1 mg via INTRAVENOUS
  Filled 2015-03-08: qty 1

## 2015-03-08 MED ORDER — HYDROXYZINE PAMOATE 25 MG PO CAPS
25.0000 mg | ORAL_CAPSULE | Freq: Every day | ORAL | Status: DC
  Filled 2015-03-08: qty 1

## 2015-03-08 MED ORDER — ACETAMINOPHEN 325 MG PO TABS: 650.0000 mg | ORAL_TABLET | Freq: Four times a day (QID) | ORAL | Status: DC | PRN

## 2015-03-08 MED ORDER — SODIUM CHLORIDE 0.9 % IV SOLN
INTRAVENOUS | Status: DC
  Administered 2015-03-08 – 2015-03-12 (×4): via INTRAVENOUS

## 2015-03-08 MED ORDER — ACETAMINOPHEN 650 MG RE SUPP: 650.0000 mg | Freq: Four times a day (QID) | RECTAL | Status: DC | PRN

## 2015-03-08 MED ORDER — SODIUM CHLORIDE 0.9 % IV SOLN: INTRAVENOUS | Status: DC

## 2015-03-08 MED ORDER — METOPROLOL TARTRATE 25 MG PO TABS
25.0000 mg | ORAL_TABLET | Freq: Two times a day (BID) | ORAL | Status: DC
  Administered 2015-03-08 – 2015-03-09 (×2): 25 mg via ORAL
  Filled 2015-03-08 (×3): qty 1

## 2015-03-08 MED ORDER — MORPHINE SULFATE 2 MG/ML IJ SOLN: 2.0000 mg | INTRAMUSCULAR | Status: DC | PRN

## 2015-03-08 MED ORDER — ONDANSETRON HCL 4 MG/2ML IJ SOLN
4.0000 mg | Freq: Once | INTRAMUSCULAR | Status: AC
  Administered 2015-03-08: 4 mg via INTRAVENOUS
  Filled 2015-03-08: qty 2

## 2015-03-08 MED ORDER — HYDROXYZINE HCL 25 MG PO TABS
25.0000 mg | ORAL_TABLET | Freq: Every day | ORAL | Status: DC
  Administered 2015-03-09: 25 mg via ORAL
  Filled 2015-03-08: qty 1

## 2015-03-08 MED ORDER — INSULIN ASPART 100 UNIT/ML ~~LOC~~ SOLN
0.0000 [IU] | SUBCUTANEOUS | Status: DC
  Administered 2015-03-08: 3 [IU] via SUBCUTANEOUS
  Administered 2015-03-09 – 2015-03-10 (×5): 2 [IU] via SUBCUTANEOUS
  Administered 2015-03-11: 1 [IU] via SUBCUTANEOUS

## 2015-03-08 MED ORDER — GABAPENTIN 400 MG PO CAPS
400.0000 mg | ORAL_CAPSULE | Freq: Three times a day (TID) | ORAL | Status: DC
  Administered 2015-03-09 – 2015-03-12 (×10): 400 mg via ORAL
  Filled 2015-03-08 (×14): qty 1

## 2015-03-08 MED ORDER — VANCOMYCIN HCL IN DEXTROSE 1-5 GM/200ML-% IV SOLN
1000.0000 mg | Freq: Once | INTRAVENOUS | Status: AC
  Administered 2015-03-09: 1000 mg via INTRAVENOUS
  Filled 2015-03-08: qty 200

## 2015-03-08 MED ORDER — METOCLOPRAMIDE HCL 5 MG/ML IJ SOLN
5.0000 mg | Freq: Three times a day (TID) | INTRAMUSCULAR | Status: DC
  Administered 2015-03-08 – 2015-03-12 (×12): 5 mg via INTRAVENOUS
  Filled 2015-03-08 (×6): qty 1
  Filled 2015-03-08: qty 2
  Filled 2015-03-08 (×2): qty 1
  Filled 2015-03-08: qty 2
  Filled 2015-03-08: qty 1
  Filled 2015-03-08: qty 2
  Filled 2015-03-08 (×2): qty 1
  Filled 2015-03-08: qty 2
  Filled 2015-03-08: qty 1
  Filled 2015-03-08: qty 2

## 2015-03-08 MED ORDER — HEPARIN SODIUM (PORCINE) 5000 UNIT/ML IJ SOLN
5000.0000 [IU] | Freq: Three times a day (TID) | INTRAMUSCULAR | Status: DC
  Administered 2015-03-08 – 2015-03-12 (×10): 5000 [IU] via SUBCUTANEOUS
  Filled 2015-03-08 (×14): qty 1

## 2015-03-08 MED ORDER — ONDANSETRON HCL 4 MG PO TABS: 4.0000 mg | ORAL_TABLET | Freq: Four times a day (QID) | ORAL | Status: DC | PRN

## 2015-03-08 MED ORDER — HYDROMORPHONE HCL 1 MG/ML IJ SOLN
1.0000 mg | INTRAMUSCULAR | Status: DC | PRN
  Administered 2015-03-08 – 2015-03-09 (×8): 1 mg via INTRAVENOUS
  Filled 2015-03-08 (×8): qty 1

## 2015-03-08 MED ORDER — OXYCODONE-ACETAMINOPHEN 5-325 MG PO TABS: 1.0000 | ORAL_TABLET | Freq: Four times a day (QID) | ORAL | Status: DC | PRN

## 2015-03-08 MED ORDER — OXYCODONE HCL 5 MG PO TABS: 5.0000 mg | ORAL_TABLET | ORAL | Status: DC | PRN

## 2015-03-08 MED ORDER — ALPRAZOLAM 0.5 MG PO TABS
0.5000 mg | ORAL_TABLET | Freq: Two times a day (BID) | ORAL | Status: DC
  Administered 2015-03-08 – 2015-03-10 (×4): 0.5 mg via ORAL
  Filled 2015-03-08 (×4): qty 1

## 2015-03-08 MED ORDER — IPRATROPIUM-ALBUTEROL 0.5-2.5 (3) MG/3ML IN SOLN: 3.0000 mL | Freq: Every day | RESPIRATORY_TRACT | Status: DC | PRN

## 2015-03-08 MED ORDER — SODIUM CHLORIDE 0.9 % IV BOLUS (SEPSIS)
1000.0000 mL | Freq: Once | INTRAVENOUS | Status: AC
  Administered 2015-03-08: 1000 mL via INTRAVENOUS

## 2015-03-08 MED ORDER — OXCARBAZEPINE 150 MG PO TABS
150.0000 mg | ORAL_TABLET | Freq: Three times a day (TID) | ORAL | Status: DC
  Administered 2015-03-09 – 2015-03-12 (×10): 150 mg via ORAL
  Filled 2015-03-08 (×14): qty 1

## 2015-03-08 MED ORDER — SODIUM CHLORIDE 0.9 % IJ SOLN: 3.0000 mL | Freq: Two times a day (BID) | INTRAMUSCULAR | Status: DC

## 2015-03-08 MED ORDER — TIZANIDINE HCL 4 MG PO TABS
4.0000 mg | ORAL_TABLET | Freq: Three times a day (TID) | ORAL | Status: DC | PRN
  Filled 2015-03-08: qty 1

## 2015-03-08 MED ORDER — SENNOSIDES-DOCUSATE SODIUM 8.6-50 MG PO TABS: 1.0000 | ORAL_TABLET | Freq: Every day | ORAL | Status: DC

## 2015-03-08 NOTE — ED Provider Notes (Signed)
CSN: [id number]     Arrival date & time 03/08/15  7001 History   First MD Initiated Contact with Patient 03/08/15 [phone]     Chief Complaint  Patient presents with  . left side pain      HPI  Patient presents with concern of left-sided abdominal, back pain. Patient has been present for a long time, worse with the past 2 days. Typically the pain is relieved with home narcotic use. Today, the patient has no relief with this medication. There is associated nausea, fatigue, and ongoing anorexia. Longer term, the patient has new weight loss, decreased capacity in general. Patient has history of lung cancer status post resection, and is currently being evaluated for likely recurrence versus new primary cancer. Patient had bone marrow biopsy last week of the left hip. No results available yet.   Past Medical History  Diagnosis Date  . Hypertension   . Bipolar 1 disorder   . Heart murmur   . Sciatica   . Arthritis   . Anxiety   . Diverticulosis   . Chronic low back pain   . DDD (degenerative disc disease), lumbar   . Chronic shoulder pain   . Chronic neck pain   . Hepatitis B     Unclear when initially diagnosed, labs in Epic from 03/09/13  . Hepatitis C     Unclear when initially diagnosed, labs in Epic from 03/09/13  . C. difficile diarrhea     April and February 2014  . COPD (chronic obstructive pulmonary disease)   . GERD (gastroesophageal reflux disease)   . H/O hiatal hernia   . Headache(784.0)     hx  . PONV (postoperative nausea and vomiting)   . Seizures     on meds  . Diabetes mellitus   . Adenocarcinoma of lung, stage 1    Past Surgical History  Procedure Laterality Date  . Facial tumor removal Right 2000  . Brain tumor removal  09  . Brain surgery      in [place]  . Video bronchoscopy  12/04/2012    Procedure: VIDEO BRONCHOSCOPY;  Surgeon: Grace Isaac, MD;  Location: [facility] OR;  Service: Thoracic;  Laterality: N/A;  . Video assisted thoracoscopy  (vats)/wedge resection  12/04/2012    Procedure: VIDEO ASSISTED THORACOSCOPY (VATS)/WEDGE RESECTION;  Surgeon: Grace Isaac, MD;  Location: [place];  Service: Thoracic;  Laterality: Right;  . Lobectomy  12/04/2012    Procedure: LOBECTOMY;  Surgeon: Grace Isaac, MD;  Location: [place];  Service: Thoracic;  Laterality: Right;  completion of right upper lobectomy and lymph node disection, placement of on q pump  . Tubal ligation    . Tonsillectomy    . Anterior cervical decomp/discectomy fusion N/A 03/05/2014    Procedure: ANTERIOR CERVICAL DECOMPRESSION/DISCECTOMY FUSION 2 LEVELS;  Surgeon: Sinclair Ship, MD;  Location: [place];  Service: Orthopedics;  Laterality: N/A;  Anterior cervical decompression fusion, cervical 4-5, cervical 5-6 with instrumentation, allograft.   Family History  Problem Relation Age of Onset  . Hypertension Father     deceased  . Diabetes Father   . Heart disease Father   . Hyperlipidemia Father   . Hypertension Mother   . Hyperlipidemia Mother   . Diabetes Mother   . Hypertension Sister   . Hyperlipidemia Sister   . Hypertension Brother   . Hyperlipidemia Brother    History  Substance Use Topics  . Smoking status: Former Smoker -- 0.25 packs/day for 10 years  Types: Cigarettes    Quit date: 07/07/2013  . Smokeless tobacco: Never Used     Comment: Smokes pk q 3 days   marajuna aug  . Alcohol Use: No     Comment: quit 68yr ago   OB History    No data available     Review of Systems  Constitutional:       Per HPI, otherwise negative  HENT:       Per HPI, otherwise negative  Respiratory:       Per HPI, otherwise negative  Cardiovascular:       Per HPI, otherwise negative  Gastrointestinal: Negative for vomiting.  Endocrine:       Negative aside from HPI  Genitourinary:       Neg aside from HPI   Musculoskeletal:       Per HPI, otherwise negative  Skin: Negative.   Neurological: Negative for syncope.      Allergies  Haldol;  Acetaminophen; Ibuprofen; Metronidazole; Morphine and related; and Penicillins  Home Medications   Prior to Admission medications   Medication Sig Start Date End Date Taking? Authorizing Provider  ALPRAZolam (Duanne Moron 1 MG tablet Take 0.5 mg by mouth 2 (two) times daily.    Yes Historical Provider, MD  Cyanocobalamin (VITAMIN B-12 IJ) Inject as directed every 30 (thirty) days. Administered by Dr. DYork Ramaround the 1st of each month   Yes Historical Provider, MD  dicyclomine (BENTYL) 20 MG tablet Take 20 mg by mouth 3 (three) times daily with meals.   Yes Historical Provider, MD  gabapentin (NEURONTIN) 400 MG capsule Take 400 mg by mouth 3 (three) times daily. Nerve pain   Yes Historical Provider, MD  HYDROcodone-acetaminophen (NORCO) 10-325 MG per tablet Take 1 tablet by mouth 3 (three) times daily.   Yes Historical Provider, MD  hydrOXYzine (VISTARIL) 25 MG capsule Take 25 mg by mouth daily.   Yes Historical Provider, MD  Ipratropium-Albuterol (COMBIVENT RESPIMAT) 20-100 MCG/ACT AERS respimat Inhale 2 puffs into the lungs daily as needed for wheezing or shortness of breath.    Yes Historical Provider, MD  KLOR-CON M20 20 MEQ tablet Take 20 mEq by mouth every morning. with food 01/04/15  Yes Historical Provider, MD  loratadine (CLARITIN) 10 MG tablet Take 10 mg by mouth every morning.    Yes Historical Provider, MD  Menthol-Methyl Salicylate (MUSCLE RUB) 10-15 % CREA Apply 1 application topically daily as needed for muscle pain. Left shoulder pain   Yes Historical Provider, MD  metFORMIN (GLUCOPHAGE) 500 MG tablet Take 500 mg by mouth 2 (two) times daily with a meal.   Yes Historical Provider, MD  metoprolol tartrate (LOPRESSOR) 25 MG tablet Take 25 mg by mouth 2 (two) times daily.  11/24/13  Yes Historical Provider, MD  olmesartan-hydrochlorothiazide (BENICAR HCT) 40-25 MG per tablet Take 1 tablet by mouth every morning.    Yes Historical Provider, MD  omeprazole (PRILOSEC) 20 MG capsule Take  20 mg by mouth 2 (two) times daily before a meal.  01/16/14  Yes Historical Provider, MD  ondansetron (ZOFRAN-ODT) 8 MG disintegrating tablet Take 1 tablet by mouth every 6 (six) hours as needed for nausea or vomiting.  03/02/15  Yes Historical Provider, MD  OXcarbazepine (TRILEPTAL) 150 MG tablet Take 150 mg by mouth 3 (three) times daily.  11/24/13  Yes Historical Provider, MD  polyethylene glycol (MIRALAX / GLYCOLAX) packet Take 17 g by mouth 2 (two) times daily as needed for mild constipation or moderate constipation.  Yes Historical Provider, MD  simvastatin (ZOCOR) 20 MG tablet Take 20 mg by mouth at bedtime.    Yes Historical Provider, MD  tiZANidine (ZANAFLEX) 4 MG tablet Take 4 mg by mouth every 8 (eight) hours as needed for muscle spasms.   Yes Historical Provider, MD  triamcinolone cream (KENALOG) 0.1 % Apply 1 application topically 2 (two) times daily as needed (for rash).  11/30/14  Yes Historical Provider, MD  Vitamin D, Ergocalciferol, (DRISDOL) 50000 UNITS CAPS capsule Take 50,000 Units by mouth every Monday.    Yes Historical Provider, MD  promethazine (PHENERGAN) 25 MG tablet Take 1 tablet (25 mg total) by mouth every 8 (eight) hours as needed for nausea or vomiting. Patient not taking: Reported on 03/01/2015 12/05/14   Dalia Heading, PA-C  senna-docusate (SENOKOT-S) 8.6-50 MG per tablet Take 1 tablet by mouth at bedtime as needed for mild constipation. Patient not taking: Reported on 03/01/2015 02/18/15   Silver Huguenin Elgergawy, MD   BP 142/72 mmHg  Pulse 92  Temp(Src) 98.6 F (37 C) (Oral)  Resp 20  SpO2 97% Physical Exam  Constitutional: She is oriented to person, place, and time. She appears ill.  HENT:  Head: Normocephalic and atraumatic.  Eyes: Conjunctivae and EOM are normal.  Cardiovascular: Normal rate and regular rhythm.   Pulmonary/Chest: Effort normal and breath sounds normal. No stridor. No respiratory distress.  Abdominal: She exhibits no distension.     Musculoskeletal: She exhibits no edema.       Arms: Patient moves all extremity spontaneously, including the left hip. Superficially, measuring about the left biopsy looks well healing and appearance.  Neurological: She is alert and oriented to person, place, and time. No cranial nerve deficit.  Skin: Skin is warm and dry.  Psychiatric: She has a normal mood and affect.  Nursing note and vitals reviewed.   ED Course  Procedures (including critical care time) Labs Review Labs Reviewed  COMPREHENSIVE METABOLIC PANEL - Abnormal; Notable for the following:    Glucose, Bld 129 (*)    Alkaline Phosphatase 272 (*)    All other components within normal limits  I-STAT CG4 LACTIC ACID, ED - Abnormal; Notable for the following:    Lactic Acid, Venous 2.39 (*)    All other components within normal limits  LIPASE, BLOOD  CBC WITH DIFFERENTIAL/PLATELET  URINALYSIS, ROUTINE W REFLEX MICROSCOPIC   Chart review demonstrates CT imaging within the past 2 weeks concerning for diffuse skeletal metastases. [name] scheduled for PET scan in the coming weeks.  On repeat exam the patient is awake, alert, remains hemodynamically stable. We discussed all findings, and her recent imaging results. Patient has follow-up with oncology in 48 hours.   MDM   She presents with ongoing left-sided pain. Here the patient is awake and alert, hemodynamically stable. Patient has borderline elevated lactic acid, otherwise labs are largely reassuring. With the patient has recently diagnosed bone lesions consistent with new malignancy versus recurrence of her lung cancer. Patient had pain control here, and was discharged to follow-up with her oncologist's in 2 days.     Carmin Muskrat, MD 03/08/15 [phone]

## 2015-03-08 NOTE — Progress Notes (Signed)
.  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 female  with multiple comorbidities including untreatedihepatitis C , stage 1 right lung cancer status post lobectomy in 2014 , who was referred to our clinic because of  her recent abnormal MRI lumbar spine findings, which is highly suspicious for metastatic cancer.  She had lumber spine infusion and screws placement in sep 2014 for her back pain, which improved after surgery but she has had some residual back pain since then but was tolerable. She reprots worseing back for the past 4-5 month, and it bacame unbearable in the past few weeks and she presented to ED on 02/02/15. Lumber MRI was done which showed diffuse bone lesions which was highly syuspecious for metastases. She also developed worsening left leg weakness in the past 2 months, able to walk independently, and mild numbness at left foot. She has been limping lately due to the back pain, no urine or stool incontinuce. She takes oxycodone twice daily which was prescribed by her primary care physician.    She lost some of appetie, lost 15 lb in the last year, No fever or chills. (+) cough with yellow sputum, and some chest discomfort, no dyspnea on exertion.  She lives with her son, she is able to take care of herself, but not much else. She spends most of time sitting or laying down during daytime.   Her last mammogram was one years ago, last colonoscopy one years ago,  which were all normal normal per patient.   INTERIM HISTORY: Patient walk-in to clinic  today with her daughter, due to the concerning of nausea vomiting and worsening pain. She reports her back pain has been getting much worse in the past few weeks, and its unbearable now. She also developed recurrent nausea and vomiting in the past few days, and has not been able to keep food down. She has had 3 ED visit since I saw her 3 weeks ago. She was referred to radiation oncology, but did not show up and appointment has been rescheduled a few times. Her PET scan preauthorization was finally done last week, and she is scheduled for scan this week. She also had MI of brain last week, but I be contrast was not able to give an due to the IV access issue.  MEDICAL HISTORY:  Past Medical History  Diagnosis Date  . Hypertension   . Bipolar 1 disorder   . Heart murmur   . Sciatica   . Arthritis   . Anxiety   . Diverticulosis   . Chronic low back pain   . DDD (degenerative disc disease), lumbar   . Chronic shoulder pain   . Chronic neck pain   . Hepatitis B     Unclear when initially diagnosed, labs in Epic from 03/09/13  . Hepatitis C     Unclear when initially diagnosed, labs in Epic from 03/09/13  . C. difficile diarrhea     April and February 2014  . COPD (chronic obstructive pulmonary disease)   . GERD (gastroesophageal reflux disease)   . H/O hiatal hernia   . Headache(784.0)  hx  . PONV (postoperative nausea and vomiting)   . Seizures     on meds  . Diabetes mellitus   . Adenocarcinoma of lung, stage 1     SURGICAL HISTORY: Past Surgical History  Procedure Laterality Date  . Facial tumor removal Right 2000  . Brain tumor removal  09  . Brain surgery      in [place]  . Video bronchoscopy  12/04/2012    Procedure: VIDEO BRONCHOSCOPY;  Surgeon: Grace Isaac, MD;  Location: [facility] OR;  Service: Thoracic;  Laterality: N/A;  . Video assisted thoracoscopy (vats)/wedge resection  12/04/2012    Procedure: VIDEO ASSISTED THORACOSCOPY (VATS)/WEDGE RESECTION;  Surgeon:  Grace Isaac, MD;  Location: [facility];  Service: Thoracic;  Laterality: Right;  . Lobectomy  12/04/2012    Procedure: LOBECTOMY;  Surgeon: Grace Isaac, MD;  Location: [facility];  Service: Thoracic;  Laterality: Right;  completion of right upper lobectomy and lymph node disection, placement of on q pump  . Tubal ligation    . Tonsillectomy    . Anterior cervical decomp/discectomy fusion N/A 03/05/2014    Procedure: ANTERIOR CERVICAL DECOMPRESSION/DISCECTOMY FUSION 2 LEVELS;  Surgeon: Sinclair Ship, MD;  Location: [facility];  Service: Orthopedics;  Laterality: N/A;  Anterior cervical decompression fusion, cervical 4-5, cervical 5-6 with instrumentation, allograft.    SOCIAL HISTORY: History   Social History  . Marital Status: Widowed    Spouse Name: N/A  . Number of Children: 5  . Years of Education: N/A   Occupational History  . n/a     patient draws [employer]   Social History Main Topics  . Smoking status: Former Smoker -- 0.25 packs/day for 10 years    Types: Cigarettes    Quit date: 07/07/2013  . Smokeless tobacco: Never Used     Comment: Smokes pk q 3 days   marajuna aug  . Alcohol Use: No     Comment: quit 77yr ago  . Drug Use: Yes    Special: Hydrocodone, Marijuana     Comment: "maybe about once a month"  . Sexual Activity: No   Other Topics Concern  . Not on file   Social History Narrative    FAMILY HISTORY: Family History  Problem Relation Age of Onset  . Hypertension Father     deceased  . Diabetes Father   . Heart disease Father   . Hyperlipidemia Father   . Hypertension Mother   . Hyperlipidemia Mother   . Diabetes Mother   . Hypertension Sister   . Hyperlipidemia Sister   . Hypertension Brother   . Hyperlipidemia Brother     ALLERGIES:  is allergic to haldol; acetaminophen; ibuprofen; metronidazole; morphine and related; and penicillins.  MEDICATIONS:  Current Facility-Administered Medications  Medication Dose Route Frequency Provider Last  Rate Last Dose  . 0.9 %  sodium chloride infusion   Intravenous Continuous YTruitt Merle MD       No current outpatient prescriptions on file.   Facility-Administered Medications Ordered in Other Visits  Medication Dose Route Frequency Provider Last Rate Last Dose  . 0.9 %  sodium chloride infusion   Intravenous Continuous EKelvin Cellar MD 100 mL/hr at 03/08/15 1731    . ALPRAZolam (Duanne Moron tablet 0.5 mg  0.5 mg Oral BID EKelvin Cellar MD   0.5 mg at 03/08/15 2058  . alum & mag hydroxide-simeth (MAALOX/MYLANTA) 200-200-20 MG/5ML suspension 30 mL  30 mL Oral Q6H PRN EKelvin Cellar MD      .  gabapentin (NEURONTIN) capsule 400 mg  400 mg Oral TID Kelvin Cellar, MD   400 mg at 03/08/15 1800  . heparin injection 5,000 Units  5,000 Units Subcutaneous 3 times per day Kelvin Cellar, MD   5,000 Units at 03/08/15 2107  . HYDROmorphone (DILAUDID) injection 1 mg  1 mg Intravenous Q2H PRN Kelvin Cellar, MD   1 mg at 03/08/15 2215  . hydrOXYzine (ATARAX/VISTARIL) tablet 25 mg  25 mg Oral Daily Kelvin Cellar, MD   25 mg at 03/08/15 1800  . insulin aspart (novoLOG) injection 0-15 Units  0-15 Units Subcutaneous 6 times per day Kelvin Cellar, MD   3 Units at 03/08/15 2053  . ipratropium-albuterol (DUONEB) 0.5-2.5 (3) MG/3ML nebulizer solution 3 mL  3 mL Inhalation Daily PRN Kelvin Cellar, MD      . metoCLOPramide (REGLAN) injection 5 mg  5 mg Intravenous 3 times per day Kelvin Cellar, MD   5 mg at 03/08/15 2215  . metoprolol tartrate (LOPRESSOR) tablet 25 mg  25 mg Oral BID Kelvin Cellar, MD   25 mg at 03/08/15 2106  . ondansetron (ZOFRAN) tablet 4 mg  4 mg Oral Q6H PRN Kelvin Cellar, MD       Or  . ondansetron (ZOFRAN) injection 4 mg  4 mg Intravenous Q6H PRN Kelvin Cellar, MD      . OXcarbazepine (TRILEPTAL) tablet 150 mg  150 mg Oral TID Kelvin Cellar, MD   150 mg at 03/08/15 1800  . oxyCODONE (Oxy IR/ROXICODONE) immediate release tablet 5 mg  5 mg Oral Q4H PRN Kelvin Cellar, MD        . sodium chloride 0.9 % injection 3 mL  3 mL Intravenous Q12H Kelvin Cellar, MD   3 mL at 03/08/15 2252  . tiZANidine (ZANAFLEX) tablet 4 mg  4 mg Oral Q8H PRN Kelvin Cellar, MD      . Derrill Memo ON 03/09/2015] vancomycin (VANCOCIN) IVPB 1000 mg/200 mL premix  1,000 mg Intravenous Once Hedy Jacob, PA-C        REVIEW OF SYSTEMS:   Constitutional: Denies fevers, chills or abnormal night sweats, (+) malaise and weight loss  Eyes: Denies blurriness of vision, double vision or watery eyes Ears, nose, mouth, throat, and face: Denies mucositis or sore throat Respiratory:(+) cpugh, no dyspnea or wheezes Cardiovascular: Denies palpitation, chest discomfort or lower extremity swelling Gastrointestinal:  Denies nausea, heartburn or change in bowel habits Skin: Denies abnormal skin rashes Lymphatics: Denies new lymphadenopathy or easy bruising Neurological:Denies numbness, tingling or new weaknesses Behavioral/Psych: Mood is stable, no new changes  All other systems were reviewed with the patient and are negative.  PHYSICAL EXAMINATION: ECOG PERFORMANCE STATUS:3   Filed Vitals:   03/08/15 1410  BP: 172/83  Pulse: 86  Temp: 98.4 F (36.9 C)   There were no vitals filed for this visit.  GENERAL:alert, distress due to nausea and pain. She was moaning and turning in the recliner in the clinic, persistent nausea and vomiting throughout her visit. SKIN: skin color, texture, turgor are normal, no rashes or significant lesions EYES: normal, conjunctiva are pink and non-injected, sclera clear OROPHARYNX:no exudate, no erythema and lips, buccal mucosa, and tongue normal  NECK: supple, thyroid normal size, non-tender, without nodularity LYMPH:  no palpable lymphadenopathy in the cervical, axillary or inguinal LUNGS: clear to auscultation and percussion with normal breathing effort HEART: regular rate & rhythm and no murmurs and no lower extremity edema ABDOMEN:abdomen soft, non-tender and normal  bowel sounds Musculoskeletal:no cyanosis of digits  and no clubbing no significant tenderness on spine  PSYCH: alert & oriented x 3 with fluent speech NEURO: no focal motor/sensory deficits   LABORATORY DATA:  I have reviewed the data as listed Lab Results  Component Value Date   WBC 7.0 03/08/2015   HGB 12.9 03/08/2015   HCT 40.0 03/08/2015   MCV 89.9 03/08/2015   PLT 184 03/08/2015    Recent Labs  12/29/14 1201 02/17/15 0749 02/18/15 0430 03/08/15 0914  NA 139 138 142 136  K 3.7 3.0* 3.7 3.6  CL 105 97 107 102  CO2 _0 GLUCOSE 96 144* 87 129*  BUN _1 CREATININE 0.59 0.65 0.57 0.54  CALCIUM 9.4 9.7 8.8 9.4  GFRNONAA >90 >90 >90 >90  GFRAA >90 >90 >90 >90  PROT 6.7 8.0  --  7.2  ALBUMIN 3.9 4.5  --  4.0  AST 20 26  --  24  ALT 13 14  --  12  ALKPHOS 104 198*  --  272*  BILITOT 0.8 0.8  --  0.7   Pathology report Bone, biopsy, lytic lesion involving the left iliac crest 03/04/2015 - METASTATIC ADENOCARCINOMA WITH ABUNDANT MUCIN. Microscopic Comment The morphologic features are compatible with metastasis from the previous right upper lobe well-differentiated adenocarcinoma with abundant mucin from 12/12/2012 [phone]).  RADIOGRAPHIC STUDIES: I have personally reviewed the radiological images as listed and agreed with the findings in the report. Dg Chest 2 View  02/17/2015   CLINICAL DATA:  Abdominal pain and emesis  EXAM: CHEST  2 VIEW  COMPARISON:  03/05/2014  FINDINGS: Normal heart size and stable aortic and hilar contours. There is unchanged distortion of right hilar architecture related to upper lobectomy. There is no edema, consolidation, effusion, or pneumothorax. Mild thoracic dextroscoliosis. No acute osseous findings.  IMPRESSION: 1. No active cardiopulmonary disease. 2. Right upper lobectomy.   Electronically Signed   By: Monte Fantasia M.D.   On: 02/17/2015 07:17   Ct Chest Wo Contrast  02/24/2015   CLINICAL DATA:  Right upper lobectomy  for cancer. MRI on 02/02/2015 showed diffuse osseous metastatic disease.  EXAM: CT CHEST WITHOUT CONTRAST  TECHNIQUE: Multidetector CT imaging of the chest was performed following the standard protocol without IV contrast.  COMPARISON:  Multiple exams, including 02/17/2015 and 09/10/2014  FINDINGS: Mediastinum/Nodes: Right upper paratracheal lymph node 0.8 cm on image 10, series 2, not well shown previously. Currently no pathologic thoracic adenopathy is identified. Thyroid gland unremarkable. Aortic atherosclerotic calcification noted.  Lungs/Pleura: Postoperative findings right upper lobectomy. Emphysema. Minimal lingular scarring and minimal left lower lobe scarring anteriorly. No recurrent lung mass. No pleural mass identified.  6 mm ground-glass density nodule on image 22 of series 603 in the left lower lobe, equivocally questionably enlarging compared to 12/28/12.  Upper abdomen: Mild extrahepatic biliary dilatation, up to 9 mm, significance uncertain. This is not dissimilar to prior exams. Gastrohepatic ligament lymph node 0.9 cm in short axis, image 54 series 2. (Previously the same).  Musculoskeletal: Widespread osseous metastatic disease primarily manifesting as multifocal variable density (some sclerotic, some lytic) lesions less readily seen in the spine and sternum, although some slight rib endosteal irregularities can also be appreciated on care for review. A manubrial lucent lesion in the sternum measures 2.0 by 1.0 cm on image 49 of series 603, and was not present at all on the prior chest CT. The spine lesions and other bony lesions are also not readily apparent on prior  CT, indicating a rapid onset. An index lytic lesion in the T5 vertebral body measures 1.5 by 1.1 by 1.3 cm, and all the though the base of vertebral plexus extends towards this lesion I do not see definite extension outside of the vertebral body.  IMPRESSION: 1. No compelling new pulmonary mass is identified as a primary site for the  diffuse osseous metastatic disease. However, new osseous metastatic lesions are present throughout the thoracic spine, sternum, and some of the ribs. 2. Very faint ground-glass density 5 mm pulmonary nodule in the left lower lobe, questionable change compared back through 12/28/2012. This may merit observation.   Electronically Signed   By: Van Clines M.D.   On: 02/24/2015 21:39   Mr Brain Wo Contrast  03/01/2015   CLINICAL DATA:  Osseous metastatic disease. Prior right upper lobectomy for lung cancer.  EXAM: MRI HEAD WITHOUT CONTRAST  TECHNIQUE: Multiplanar, multiecho pulse sequences of the brain and surrounding structures were obtained without intravenous contrast.  COMPARISON:  08/27/2012  FINDINGS: IV access could not be obtained despite multiple attempts by the IV team. Contrast could therefore not be administered. Coronal T2 weighted images were not performed as the patient was in pain and could not tolerate further imaging.  Bone marrow signal of the skull and visualized upper cervical spine is diffusely heterogeneous consistent with known widespread osseous metastases. A left parietal skull metastasis extends through the inner table and may involve the dura (series 9, image 34).  Sequelae of prior left frontal craniotomy are again identified. There is underlying left frontal lobe encephalomalacia with a small amount of chronic blood products present. Confluent T2 hyperintensity in the left frontal white matter in this region is unchanged and presumably reflects gliosis. There is no evidence of acute infarct, midline shift, or extra-axial fluid collection. There is mild generalized cerebral atrophy, within normal limits for age. Scattered, small foci of T2 hyperintensity in the cerebral white matter are similar to the prior MRI and nonspecific but compatible with mild chronic small vessel ischemic disease. No parenchymal brain mass identified, however evaluation is limited by lack of IV contrast.   Orbits are unremarkable. Paranasal sinuses are clear. No significant mastoid effusion is seen. Major intracranial vascular flow voids are preserved, with the distal right vertebral artery appearing hypoplastic.  IMPRESSION: 1. 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 2. No parenchymal brain mass identified, however evaluation for small lesions is limited by lack of IV contrast. Postcontrast brain MRI is recommended when/if IV access is obtained. 3. Unchanged left frontal encephalomalacia.   Electronically Signed   By: Logan Bores   On: 03/01/2015 17:09   Ct Abdomen Pelvis W Contrast  02/17/2015   CLINICAL DATA:  Acute abdominal pain.  History lung cancer  EXAM: CT ABDOMEN AND PELVIS WITH CONTRAST  TECHNIQUE: Multidetector CT imaging of the abdomen and pelvis was performed using the standard protocol following bolus administration of intravenous contrast.  CONTRAST:  160m OMNIPAQUE IOHEXOL 300 MG/ML  SOLN  COMPARISON:  CT abdomen 12/05/2014  FINDINGS: Lung bases are clear.  Hiatal hernia noted.  Normal liver. No liver mass lesion. Gallbladder normal. Common bile duct measures approximately 11 mm in diameter and is mildly enlarged. Correlate with liver function tests. Common bile duct slightly more prominent compared with the prior CT 12/05/2014. Pancreatic duct nondilated. No pancreatic mass. Negative for pancreatitis. Normal spleen.  Normal kidneys. No renal mass or obstruction. No renal calculi. Urinary bladder normal. Normal uterus  which is retroverted.  Negative for bowel obstruction. No bowel thickening. Normal appendix.  Negative for free fluid.  No adenopathy identified.  Widespread bony lesions compatible with metastatic disease. These lesions are mostly sclerotic but there are some lesions which are lytic including the left posterior iliac wing. Bone lesions show rapid progression since the CT of 12/05/2014. Similar appearance on the MRI of 02/02/2015.  Given history of stage I lung cancer, metastatic lung cancer is possible however also consider metastatic breast cancer. No pathologic fracture. Prior surgical fusion L5-S1 with pseudarthrosis and grade 1 anterior slip.  IMPRESSION: Common bile duct dilated 11 mm. Common duct stone not excluded. No liver lesions  Widespread bony metastatic disease, with rapid progression since 12/05/2014. Given the sclerotic appearance of these lesions, consider breast cancer is well as metastatic lung cancer.   Electronically Signed   By: Franchot Gallo M.D.   On: 02/17/2015 10:15   Dg Esophagus  02/18/2015   CLINICAL DATA:  64 year old female with history of dysphagia, complaining that solid foods get stuck for the past 5 months.  EXAM: ESOPHOGRAM/BARIUM SWALLOW  TECHNIQUE: Combined double contrast and single contrast examination performed using effervescent crystals, thick barium liquid, and thin barium liquid.  FLUOROSCOPY TIME:  If the device does not provide the exposure index:  Fluoroscopy Time:  3 minutes and 27 seconds.  Number of Acquired Images:  39  COMPARISON:  No priors.  FINDINGS: Initial double contrast images of the esophagus demonstrated a normal appearance of the esophageal mucosa. Multiple single swallow attempts were observed, with incomplete propagation of the primary peristaltic waves on every attempt. This was of variable severity, with various degrees of proximal escape ranging from moderate to mild. No tertiary contractions were noted. Full column esophagram demonstrated no esophageal mass, stricture or esophageal ring. No hiatal hernia. No gastroesophageal reflux was observed during a water siphon test. A barium tablet was administered, which passed readily into the stomach.  IMPRESSION: 1. Nonspecific esophageal motility disorder, as above. 2. The esophagus is structurally normal.   Electronically Signed   By: Vinnie Langton M.D.   On: 02/18/2015 15:46   Ct Biopsy  03/04/2015   INDICATION: History  of lung cancer, now with enlarging lytic lesion within the posterior aspect of the left iliac crest worrisome for metastatic disease. Please perform CT-guided biopsy for tissue diagnostic purposes.  EXAM: CT-GUIDED BIOPSY OF LYTIC LESION WITHIN THE POSTERIOR ASPECT OF THE LEFT ILIAC CREST  COMPARISON:  CT abdomen and pelvis - 02/17/2015; 12/05/2014  MEDICATIONS: Fentanyl 100 mcg IV; Versed 2 mg IV  ANESTHESIA/SEDATION: Sedation Time  17 minutes  CONTRAST:  None  COMPLICATIONS: None immediate.  PROCEDURE: Informed consent was obtained from the patient following an explanation of the procedure, risks, benefits and alternatives. The patient understands, agrees and consents for the procedure. All questions were addressed. A time out was performed prior to the initiation of the procedure. The patient was positioned prone and non-contrast localization CT was performed of the pelvis demonstrating unchanged appearance of ill-defined approximately 1.0 x 1.3 cm lytic lesion within the posterior aspect of the left iliac crest (image 8, series 2). The operative site was prepped and draped in the usual sterile fashion.  Under sterile conditions and local anesthesia, a 22 gauge spinal needle was utilized for procedural planning. Next, an 11 gauge coaxial bone biopsy needle was advanced into the peripheral aspect of the lytic lesion. Needle position was confirmed with CT imaging and a bone biopsy was obtained with the  11 gauge outer bone marrow device.  The 11 gauge coaxial bone biopsy needle was then readvanced into the peripheral aspect of the lytic lesion. Needle positioning was confirmed with CT imaging and 2 bone lesion biopsies were obtained with the inner 13 gauge biopsy device. Finally, the 11 gauge coaxial bone biopsy needle was utilized to obtain a final sample.  Postprocedural imaging was obtained and was negative for complication, specifically, no evidence of hematoma formation. The needle was removed intact.  Hemostasis was obtained with compression and a dressing was placed. The patient tolerated the procedure well without immediate post procedural complication.  IMPRESSION: Successful CT guided left iliac bone lesion core biopsies.   Electronically Signed   By: Sandi Mariscal M.D.   On: 03/04/2015 15:18   US Abdomen Limited Ruq  02/17/2015   CLINICAL DATA:  Generalized abdominal pain  EXAM: US ABDOMEN LIMITED - RIGHT UPPER QUADRANT  COMPARISON:  None.  FINDINGS: Gallbladder:  No gallstones or wall thickening visualized. No sonographic Murphy sign noted.  Common bile duct:  Diameter: 5.2 mm  Liver:  No focal lesion identified. Within normal limits in parenchymal echogenicity.  IMPRESSION: Normal   Electronically Signed   By: Franchot Gallo M.D.   On: 02/17/2015 14:20    ASSESSMENT & PLAN:  64 year old female, with significant history of smoking and stage I right lung adenocarcinoma status post lobectomy in 2014, no presented with was any back pain and diffuse bone lesions on the lumbar spine MRI.  1. 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 Her clinical presentation and scan findings are highly suspicious for metastatic cancer, possible from her prior lung cancer. -I reviewed her bone biopsy results, which showed metastatic adenocarcinoma, consistent with lung primary -We discussed that this is incurable disease, and overall prognosis is very poor. -Her lung primary was tested negative for EGFR gene mutation and ALK translocation -She may not be a good candidate for systemic chemotherapy.  -I discussed the option of palliative care alone, however her daughter is doing did not.  2. Persistent nausea and vomiting -This could be related to her underlying malignancy, however would like to ruled out bowel obstruction -I recommend her to be admitted to the hospital today -I spoke with hospitalist Dr. Abran Cantor, who  accepts the patient   2. Back pain and spine metastasis -She has diffuse bone metastasis in spine, especially at T12 vertebral body,  No cold compression on scan. - I'll refer her to radiation oncology for palliative radiation, hopefully start during her hospital stay  3.  History of stage I lung  Adenocarcinoma status post resection - her tumor was tested for EGFR gene mutation and ALK , which were negative.  4.  Untreated hepatitis C,  Hypertension, diabetes, bipolar - She'll continue follow-up with her primary care physician  5. Back pain -Secondary to bone metastasis. -Need better pain control, I suggest her premedication during her hospital stay   Plan -Direct admit to [facility] Today, for persistent nausea vomiting, dehydration, failure to survive -I'll follow up during her hospital stay -Rad/onc consult for palliative radiation  All questions were answered. The patient knows to call the clinic with any problems, questions or concerns. I spent 30 minutes counseling the patient face to face. The total time spent in the appointment was 40 minutes and more than 50% was on counseling.     Truitt Merle,  MD 03/08/2015 11:12 PM

## 2015-03-08 NOTE — Progress Notes (Signed)
Pt's daughter came in with concerns for her mother & wanted to talk to someone. She is stress & doesn't know what to do or what is going on. Discussed getting appt for tomorrow for port placement at [facility] & to be there @ 12:30 & NPO after MN.  Pt comes in while we are talking & is hunched over & gagging & states that she can't keep anything down.  She had been to the ED this am & received something for nausea-she states zofran but didn't help.  Discussed with Dr Burr Medico & pt probably needs admit.   Attempted x 2 to start IVF without success in right hand & second nurse tried twice without success.  Transferred to 1336 @ 3:40 pm via w/c.  Dr Burr Medico worked out admit through hospitalist.

## 2015-03-08 NOTE — ED Notes (Signed)
Pt c/o left side pain that radiates from her buttock area up her left side.  Pt states that hurting all weekend.  Pt states that she has cancer in her bottom.

## 2015-03-08 NOTE — ED Notes (Signed)
Dr Vanita Panda made aware of abnormal lactic.

## 2015-03-08 NOTE — Discharge Instructions (Signed)
As discussed, it is important that you follow up as scheduled with your physician for continued management of your condition.  If you develop any new, or concerning changes in your condition, please return to the emergency department immediately.

## 2015-03-08 NOTE — ED Notes (Signed)
Pt aware that she cannot drive herself home after receiving narcotics in the ER. Pt agreeable to this plan.

## 2015-03-08 NOTE — Telephone Encounter (Signed)
Order placed & scheduled Port placement with IR for tomorrow & pt to be there at 12:30 pm & NPO after MN & to bring a driver.

## 2015-03-08 NOTE — ED Notes (Signed)
Pt returned to Registration reporting that she would like to be seen again.  This Charge RN informed the Pt that she has been discharged and there is nothing more that our providers will do.  Pt noted to be bent over and covering mouth w/ a tissue.  She was encouraged to get her prescriptions fills, go home, and rest.  Prior to discharge, pt has voiced concern regarding her daughter picking her up.  At this time, Pt reports "she's over at the [facility].  She won't take me home like this."  This Agricultural consultant, again, encouraged the Pt to get her prescriptions filled, go home, and rest.  Pt shouted "forget this," stood straight up, grabbed her purse from a lobby chair, and stormed out of the lobby w/o difficulty or distress.

## 2015-03-08 NOTE — H&P (Signed)
[facility] History and Physical  Calleigh [name] YWV:[id number] DOB: 23-Sep-1951 DOA: 03/08/2015  Referring physician:  PCP: Harvie Junior, MD   Chief Complaint: Nausea/vomiting  HPI: [name] [name] is a 64 y.o. 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 Lesions appeared to have a sclerotic appearance. On 03/04/2015 she underwent CT-guided biopsy of lytic lesion within the posterior aspect of the left iliac crest, pathology showing metastatic adenocarcinoma with abundant mucin morphologic features compatible with metastasis from a previous right upper lobe adenocarcinoma. She was recently admitted to the medicine service on 02/17/2015 and discharged on 02/19/2015 at which time she was treated for abdominal pain. Patient reporting ongoing abdominal pain associate with nausea and vomiting over the past several weeks, having difficulties keeping by mouth down. She also complains of intermittent chest pain along with back pain and bilateral lower extremity pain. She presented to the emergency department early this morning where she was discharged then went to the [facility] with the above complaints and was made a direct admission. She reports having chills however denies fevers.                                                                                                                                                                                              Review of Systems:  Constitutional:  No weight loss, night sweats, Fevers, positive for chills, fatigue, malaise, generalized weakness.  HEENT:  No headaches, Difficulty swallowing,Tooth/dental problems,Sore throat,  No sneezing, itching, ear ache, nasal congestion, post nasal drip,  Cardio-vascular:  No  chest pain, Orthopnea, PND, swelling in lower extremities, anasarca, dizziness, palpitations  GI:  No heartburn, indigestion, positive for abdominal pain, nausea, vomiting, diarrhea, change in bowel habits, loss of appetite  Resp:  Positive for shortness of breath with exertion or at rest. No excess mucus, no productive cough, No non-productive cough, No coughing up of blood.No change in color of mucus.No wheezing.No chest wall deformity  Skin:  no rash or lesions.  GU:  no dysuria, change in color of urine, no urgency or frequency. No flank pain.  Musculoskeletal:  No joint pain or swelling. No decreased range of motion. Positive for back pain.  Psych:  No change in mood or affect. No depression or anxiety. No memory loss.   Past Medical History  Diagnosis Date  . Hypertension   . Bipolar 1 disorder   . Heart murmur   . Sciatica   .  Arthritis   . Anxiety   . Diverticulosis   . Chronic low back pain   . DDD (degenerative disc disease), lumbar   . Chronic shoulder pain   . Chronic neck pain   . Hepatitis B     Unclear when initially diagnosed, labs in Epic from 03/09/13  . Hepatitis C     Unclear when initially diagnosed, labs in Epic from 03/09/13  . C. difficile diarrhea     April and February 2014  . COPD (chronic obstructive pulmonary disease)   . GERD (gastroesophageal reflux disease)   . H/O hiatal hernia   . Headache(784.0)     hx  . PONV (postoperative nausea and vomiting)   . Seizures     on meds  . Diabetes mellitus   . Adenocarcinoma of lung, stage 1    Past Surgical History  Procedure Laterality Date  . Facial tumor removal Right 2000  . Brain tumor removal  09  . Brain surgery      in [place]  . Video bronchoscopy  12/04/2012    Procedure: VIDEO BRONCHOSCOPY;  Surgeon: Grace Isaac, MD;  Location[facility] OR;  Service: Thoracic;  Laterality: N/A;  . Video assisted thoracoscopy (vats)/wedge resection  12/04/2012    Procedure: VIDEO ASSISTED  THORACOSCOPY (VATS)/WEDGE RESECTION;  Surgeon: Grace Isaac, MD;  Location: [place];  Service: Thoracic;  Laterality: Right;  . Lobectomy  12/04/2012    Procedure: LOBECTOMY;  Surgeon: Grace Isaac, MD;  Location: [place];  Service: Thoracic;  Laterality: Right;  completion of right upper lobectomy and lymph node disection, placement of on q pump  . Tubal ligation    . Tonsillectomy    . Anterior cervical decomp/discectomy fusion N/A 03/05/2014    Procedure: ANTERIOR CERVICAL DECOMPRESSION/DISCECTOMY FUSION 2 LEVELS;  Surgeon: Sinclair Ship, MD;  Location: [place];  Service: Orthopedics;  Laterality: N/A;  Anterior cervical decompression fusion, cervical 4-5, cervical 5-6 with instrumentation, allograft.   Social History:  reports that she quit smoking about 20 months ago. Her smoking use included Cigarettes. She has a 2.5 pack-year smoking history. She has never used smokeless tobacco. She reports that she uses illicit drugs (Hydrocodone and Marijuana). She reports that she does not drink alcohol.  Allergies  Allergen Reactions  . Haldol [Haloperidol Decanoate] Other (See Comments)    tongue swelling  . Acetaminophen Other (See Comments)    Stomach pain  . Ibuprofen Other (See Comments)    Stomach pain  . Metronidazole Other (See Comments)    Severe stomach upset--was instructed to avoid Flagyl.  . Morphine And Related Hives    hives  . Penicillins Rash    Family History  Problem Relation Age of Onset  . Hypertension Father     deceased  . Diabetes Father   . Heart disease Father   . Hyperlipidemia Father   . Hypertension Mother   . Hyperlipidemia Mother   . Diabetes Mother   . Hypertension Sister   . Hyperlipidemia Sister   . Hypertension Brother   . Hyperlipidemia Brother      Prior to Admission medications   Medication Sig Start Date End Date Taking? Authorizing Provider  ALPRAZolam Duanne Moron) 1 MG tablet Take 0.5 mg by mouth 2 (two) times daily.     Historical  Provider, MD  Cyanocobalamin (VITAMIN B-12 IJ) Inject as directed every 30 (thirty) days. Administered by Dr. York Ram around the 1st of each month    Historical Provider,  MD  dicyclomine (BENTYL) 20 MG tablet Take 20 mg by mouth 3 (three) times daily with meals.    Historical Provider, MD  gabapentin (NEURONTIN) 400 MG capsule Take 400 mg by mouth 3 (three) times daily. Nerve pain    Historical Provider, MD  hydrOXYzine (VISTARIL) 25 MG capsule Take 25 mg by mouth daily.    Historical Provider, MD  Ipratropium-Albuterol (COMBIVENT RESPIMAT) 20-100 MCG/ACT AERS respimat Inhale 2 puffs into the lungs daily as needed for wheezing or shortness of breath.     Historical Provider, MD  KLOR-CON M20 20 MEQ tablet Take 20 mEq by mouth every morning. with food 01/04/15   Historical Provider, MD  loratadine (CLARITIN) 10 MG tablet Take 10 mg by mouth every morning.     Historical Provider, MD  Menthol-Methyl Salicylate (MUSCLE RUB) 10-15 % CREA Apply 1 application topically daily as needed for muscle pain. Left shoulder pain    Historical Provider, MD  metFORMIN (GLUCOPHAGE) 500 MG tablet Take 500 mg by mouth 2 (two) times daily with a meal.    Historical Provider, MD  metoprolol tartrate (LOPRESSOR) 25 MG tablet Take 25 mg by mouth 2 (two) times daily.  11/24/13   Historical Provider, MD  olmesartan-hydrochlorothiazide (BENICAR HCT) 40-25 MG per tablet Take 1 tablet by mouth every morning.     Historical Provider, MD  omeprazole (PRILOSEC) 20 MG capsule Take 20 mg by mouth 2 (two) times daily before a meal.  01/16/14   Historical Provider, MD  ondansetron (ZOFRAN-ODT) 8 MG disintegrating tablet Take 1 tablet by mouth every 6 (six) hours as needed for nausea or vomiting.  03/02/15   Historical Provider, MD  OXcarbazepine (TRILEPTAL) 150 MG tablet Take 150 mg by mouth 3 (three) times daily.  11/24/13   Historical Provider, MD  oxyCODONE-acetaminophen (PERCOCET/ROXICET) 5-325 MG per tablet Take 1-2 tablets by  mouth every 6 (six) hours as needed for severe pain. 03/08/15   Carmin Muskrat, MD  polyethylene glycol [facility] / Floria Raveling) packet Take 17 g by mouth 2 (two) times daily as needed for mild constipation or moderate constipation.    Historical Provider, MD  promethazine (PHENERGAN) 25 MG tablet Take 1 tablet (25 mg total) by mouth every 8 (eight) hours as needed for nausea or vomiting. Patient not taking: Reported on 03/01/2015 12/05/14   Dalia Heading, PA-C  senna-docusate (SENOKOT-S) 8.6-50 MG per tablet Take 1 tablet by mouth at bedtime. 03/08/15   Carmin Muskrat, MD  simvastatin (ZOCOR) 20 MG tablet Take 20 mg by mouth at bedtime.     Historical Provider, MD  tiZANidine (ZANAFLEX) 4 MG tablet Take 4 mg by mouth every 8 (eight) hours as needed for muscle spasms.    Historical Provider, MD  triamcinolone cream (KENALOG) 0.1 % Apply 1 application topically 2 (two) times daily as needed (for rash).  11/30/14   Historical Provider, MD  Vitamin D, Ergocalciferol, (DRISDOL) 50000 UNITS CAPS capsule Take 50,000 Units by mouth every Monday.     Historical Provider, MD   Physical Exam: Filed Vitals:   03/08/15 1604  BP: 173/82  Pulse: 101  Temp: 97.9 F (36.6 C)  TempSrc: Axillary  Resp: 20  SpO2: 98%    Wt Readings from Last 3 Encounters:  02/26/15 60.782 kg (134 lb)  02/17/15 62.6 kg (138 lb 0.1 oz)  02/11/15 63.322 kg (139 lb 9.6 oz)    General:  Patient appears to be in mild-to-moderate distress, had episode of dry heaves during my encounter with her,  ill-appearing, complains of ongoing abdominal pain, back pain, leg pain. Eyes: PERRL, normal lids, irises & conjunctiva ENT: grossly normal hearing, lips & tongue, dry oral mucosa Neck: no LAD, masses or thyromegaly Cardiovascular: Tachycardic, RRR, no m/r/g. No LE edema. Telemetry: Tachycardic SR, no arrhythmias  Respiratory: CTA bilaterally, no w/r/r. Normal respiratory effort. Lungs overall were clear, I did not auscultate wheezing  rhonchi or rales Abdomen: soft, reports having pain with palpation to all 4 quadrants Skin: no rash or induration seen on limited exam Musculoskeletal: grossly normal tone BUE/BLE Psychiatric: grossly normal mood and affect, speech fluent and appropriate Neurologic: grossly non-focal.          Labs on Admission:  Basic Metabolic Panel:  Recent Labs Lab 03/08/15 0914  NA 136  K 3.6  CL 102  CO2 26  GLUCOSE 129*  BUN 13  CREATININE 0.54  CALCIUM 9.4   Liver Function Tests:  Recent Labs Lab 03/08/15 0914  AST 24  ALT 12  ALKPHOS 272*  BILITOT 0.7  PROT 7.2  ALBUMIN 4.0    Recent Labs Lab 03/08/15 0914  LIPASE 18   No results for input(s): AMMONIA in the last 168 hours. CBC:  Recent Labs Lab 03/04/15 1028 03/08/15 0914  WBC 7.1 7.0  NEUTROABS  --  5.2  HGB 12.8 12.9  HCT 39.6 40.0  MCV 91.7 89.9  PLT 182 184   Cardiac Enzymes: No results for input(s): CKTOTAL, CKMB, CKMBINDEX, TROPONINI in the last 168 hours.  BNP (last 3 results) No results for input(s): BNP in the last 8760 hours.  ProBNP (last 3 results) No results for input(s): PROBNP in the last 8760 hours.  CBG:  Recent Labs Lab 03/04/15 0917  GLUCAP 118*    Radiological Exams on Admission: No results found.  EKG: Independently reviewed.   Assessment/Plan Principal Problem:   Metastatic adenocarcinoma Active Problems:   Nausea with vomiting   Lung cancer, right upper lobe   Hypertension   Metastatic cancer to bone   Cancer associated pain   1. Probable metastatic adenocarcinoma of lung. [name] a 64 year old female with a past medical history of well-differentiated adenocarcinoma undergoing lobectomy in 2014, recently found to have evidence of metastatic disease on a CT scan of abdomen and pelvis. She underwent biopsy of suspicious lesion on the left iliac crest. Pathology revealing adenocarcinoma having similar morphologic features to previous right lung adenocarcinoma.  She received her care at the [facility] and is followed by Dr Burr Medico. Radiation oncology consultation will be placed. IR has been contacted for port placement. She is scheduled to undergo PET scan on 03/10/2015. Await further recommendations from medical oncology. 2. Nausea/vomiting. Patient hospitalized on 02/17/2015 which time presented with complaints of abdominal pain associate with nausea and vomiting. She underwent esophagram on 02/18/2015 which did not reveal structural abnormality. Could be related to malignancy or perhaps to diabetic gastroparesis. Will start patient on Reglan 5 mg IV 3 times a day. Provide IV fluid resuscitation, supportive care, keep her nothing by mouth for now. 3. Abdominal pain. On her previous hospitalization she was worked up for abdominal pain with a CT scan of abdomen and pelvis with IV contrast that showed normal liver, gallbladder, no pancreatic mass, negative for bowel obstruction. Radiology reported widespread bony lesions compatible with metastatic disease. Labs this morning showing AST, ALT, total bilirubin within normal limits. Will repeat CMP, check lipase, provide IV fluid resuscitation, IV antibiotic therapy and narcotic analgesics.  4. Cancer related pain. Patient complaining of  bone pain. Previous imaging studies showing evidence of widespread metastatic disease. Will provide oral and IV narcotic analgesics for pain management. Consult palliative care for assistance with the pain management. 5. Diabetes mellitus. Will provide Accu-Cheks every 4 hours with sliding scale coverage, make patient nothing by mouth given multiple episodes of nausea and vomiting. Check hemoglobin A1c. Discontinue metformin therapy. 6. Hypertension. Will continue Lopressor 25 mg by mouth twice a day. She initially presents with hypertension at think limited due to nausea vomiting along with pain. Will monitor blood pressures overnight 7. DVT prophylaxis. Subcutaneous heparin    Code  Status: Full code Family Communication: Family not present Disposition Plan: Anticipate patient will require greater than 2 nights hospitalization  Time spent: 70 min  Kelvin Cellar [facility] Pager [phone]

## 2015-03-08 NOTE — Progress Notes (Signed)
64 year old female with a history of lung cancer status post R- lobectomy in 2014 was seen by med onc, Dr. Burr Medico, for her recent abnormal MRI on 02/11/15.  The MRI was suspicious for metastatic cancer.  She had lumber spine infusion and screws placement in sep 2014 for her back pain, which improved after surgery but she has had some residual back pain since then but was tolerable. She reprots worseing back for the past 4-5 months  The patient had a left iliac biopsy on 03/04/2015. Pathology shows metastatic adenocarcinoma. The patient presented to the emergency department on 03/06/2017 because of nausea, vomiting, and progressive pain. The patient was discharged home in stable condition. She presented [facility] on 03/08/2015 with similar complaints.  I spoke with Dr. Burr Medico who requested direct admit for dehydration and pain control.  Dr. Burr Medico will order CMP, CBC, lactic acid.  Dr. Burr Medico will contact Rad Onc.  Pt may need SNF.  Vitals are stable.  Direct admit accepted for med-surg bed.  DTat

## 2015-03-09 ENCOUNTER — Ambulatory Visit: Payer: Medicare HMO | Admitting: Radiation Oncology

## 2015-03-09 ENCOUNTER — Inpatient Hospital Stay (HOSPITAL_COMMUNITY): Payer: Medicare HMO

## 2015-03-09 ENCOUNTER — Ambulatory Visit: Payer: Self-pay | Admitting: Hematology

## 2015-03-09 ENCOUNTER — Other Ambulatory Visit: Payer: Self-pay

## 2015-03-09 ENCOUNTER — Ambulatory Visit
Admit: 2015-03-09 | Discharge: 2015-03-09 | Disposition: A | Discharge status: Home / Self Care | Payer: Medicare HMO | Attending: Radiation Oncology | Admitting: Radiation Oncology

## 2015-03-09 ENCOUNTER — Ambulatory Visit (HOSPITAL_COMMUNITY): Admission: RE | Admit: 2015-03-09 | Payer: Medicare HMO | Source: Ambulatory Visit

## 2015-03-09 DIAGNOSIS — J449 Chronic obstructive pulmonary disease, unspecified: Secondary | ICD-10-CM | POA: Insufficient documentation

## 2015-03-09 DIAGNOSIS — C7951 Secondary malignant neoplasm of bone: Secondary | ICD-10-CM | POA: Insufficient documentation

## 2015-03-09 DIAGNOSIS — F419 Anxiety disorder, unspecified: Secondary | ICD-10-CM | POA: Insufficient documentation

## 2015-03-09 DIAGNOSIS — I10 Essential (primary) hypertension: Secondary | ICD-10-CM | POA: Insufficient documentation

## 2015-03-09 DIAGNOSIS — B1920 Unspecified viral hepatitis C without hepatic coma: Secondary | ICD-10-CM | POA: Insufficient documentation

## 2015-03-09 DIAGNOSIS — K5900 Constipation, unspecified: Secondary | ICD-10-CM | POA: Insufficient documentation

## 2015-03-09 DIAGNOSIS — Z794 Long term (current) use of insulin: Secondary | ICD-10-CM | POA: Insufficient documentation

## 2015-03-09 DIAGNOSIS — F319 Bipolar disorder, unspecified: Secondary | ICD-10-CM | POA: Insufficient documentation

## 2015-03-09 DIAGNOSIS — Z902 Acquired absence of lung [part of]: Secondary | ICD-10-CM | POA: Insufficient documentation

## 2015-03-09 DIAGNOSIS — Z79899 Other long term (current) drug therapy: Secondary | ICD-10-CM | POA: Insufficient documentation

## 2015-03-09 DIAGNOSIS — C3490 Malignant neoplasm of unspecified part of unspecified bronchus or lung: Secondary | ICD-10-CM | POA: Insufficient documentation

## 2015-03-09 DIAGNOSIS — M1990 Unspecified osteoarthritis, unspecified site: Secondary | ICD-10-CM | POA: Insufficient documentation

## 2015-03-09 DIAGNOSIS — M5136 Other intervertebral disc degeneration, lumbar region: Secondary | ICD-10-CM | POA: Insufficient documentation

## 2015-03-09 DIAGNOSIS — B1910 Unspecified viral hepatitis B without hepatic coma: Secondary | ICD-10-CM | POA: Insufficient documentation

## 2015-03-09 DIAGNOSIS — R112 Nausea with vomiting, unspecified: Secondary | ICD-10-CM

## 2015-03-09 DIAGNOSIS — Z87891 Personal history of nicotine dependence: Secondary | ICD-10-CM | POA: Insufficient documentation

## 2015-03-09 DIAGNOSIS — K5790 Diverticulosis of intestine, part unspecified, without perforation or abscess without bleeding: Secondary | ICD-10-CM | POA: Insufficient documentation

## 2015-03-09 DIAGNOSIS — E119 Type 2 diabetes mellitus without complications: Secondary | ICD-10-CM | POA: Insufficient documentation

## 2015-03-09 DIAGNOSIS — K449 Diaphragmatic hernia without obstruction or gangrene: Secondary | ICD-10-CM | POA: Insufficient documentation

## 2015-03-09 LAB — CBC
HCT: 37.6 % (ref 36.0–46.0)
Hemoglobin: 12.1 g/dL (ref 12.0–15.0)
MCH: 29.1 pg (ref 26.0–34.0)
MCHC: 32.2 g/dL (ref 30.0–36.0)
MCV: 90.4 fL (ref 78.0–100.0)
PLATELETS: 194 10*3/uL (ref 150–400)
RBC: 4.16 MIL/uL (ref 3.87–5.11)
RDW: 13.8 % (ref 11.5–15.5)
WBC: 8.4 10*3/uL (ref 4.0–10.5)

## 2015-03-09 LAB — COMPREHENSIVE METABOLIC PANEL
ALBUMIN: 3.9 g/dL (ref 3.5–5.2)
ALK PHOS: 249 U/L — AB (ref 39–117)
ALT: 12 U/L (ref 0–35)
ANION GAP: 11 (ref 5–15)
AST: 24 U/L (ref 0–37)
BILIRUBIN TOTAL: 0.9 mg/dL (ref 0.3–1.2)
BUN: 13 mg/dL (ref 6–23)
CO2: 26 mmol/L (ref 19–32)
Calcium: 9.1 mg/dL (ref 8.4–10.5)
Chloride: 107 mmol/L (ref 96–112)
Creatinine, Ser: 0.44 mg/dL — ABNORMAL LOW (ref 0.50–1.10)
GLUCOSE: 107 mg/dL — AB (ref 70–99)
POTASSIUM: 3 mmol/L — AB (ref 3.5–5.1)
Sodium: 144 mmol/L (ref 135–145)
Total Protein: 7 g/dL (ref 6.0–8.3)

## 2015-03-09 LAB — GLUCOSE, CAPILLARY
GLUCOSE-CAPILLARY: 126 mg/dL — AB (ref 70–99)
GLUCOSE-CAPILLARY: 142 mg/dL — AB (ref 70–99)
Glucose-Capillary: 107 mg/dL — ABNORMAL HIGH (ref 70–99)
Glucose-Capillary: 117 mg/dL — ABNORMAL HIGH (ref 70–99)
Glucose-Capillary: 139 mg/dL — ABNORMAL HIGH (ref 70–99)
Glucose-Capillary: 99 mg/dL (ref 70–99)

## 2015-03-09 LAB — HEMOGLOBIN A1C
HEMOGLOBIN A1C: 6.1 % — AB (ref 4.8–5.6)
Mean Plasma Glucose: 128 mg/dL

## 2015-03-09 MED ORDER — HYDROMORPHONE HCL 1 MG/ML IJ SOLN
0.5000 mg | INTRAMUSCULAR | Status: DC | PRN
  Administered 2015-03-09 – 2015-03-10 (×6): 0.5 mg via INTRAVENOUS
  Filled 2015-03-09 (×6): qty 1

## 2015-03-09 MED ORDER — MIDAZOLAM HCL 2 MG/2ML IJ SOLN
INTRAMUSCULAR | Status: AC | PRN
  Administered 2015-03-09: 0.5 mg via INTRAVENOUS
  Administered 2015-03-09: 1 mg via INTRAVENOUS
  Administered 2015-03-09: 0.5 mg via INTRAVENOUS

## 2015-03-09 MED ORDER — SENNOSIDES-DOCUSATE SODIUM 8.6-50 MG PO TABS
2.0000 | ORAL_TABLET | Freq: Two times a day (BID) | ORAL | Status: DC
  Administered 2015-03-10 – 2015-03-12 (×5): 2 via ORAL
  Filled 2015-03-09 (×6): qty 2

## 2015-03-09 MED ORDER — FENTANYL CITRATE (PF) 100 MCG/2ML IJ SOLN
INTRAMUSCULAR | Status: AC
  Filled 2015-03-09: qty 4

## 2015-03-09 MED ORDER — DEXAMETHASONE 4 MG PO TABS
4.0000 mg | ORAL_TABLET | Freq: Every day | ORAL | Status: DC
  Administered 2015-03-09 – 2015-03-12 (×4): 4 mg via ORAL
  Filled 2015-03-09 (×4): qty 1

## 2015-03-09 MED ORDER — OXYCODONE HCL ER 15 MG PO T12A
30.0000 mg | EXTENDED_RELEASE_TABLET | Freq: Two times a day (BID) | ORAL | Status: DC
  Administered 2015-03-09 – 2015-03-10 (×3): 30 mg via ORAL
  Filled 2015-03-09 (×3): qty 2

## 2015-03-09 MED ORDER — HYDROXYZINE HCL 25 MG PO TABS: 25.0000 mg | ORAL_TABLET | Freq: Three times a day (TID) | ORAL | Status: DC | PRN

## 2015-03-09 MED ORDER — METOPROLOL TARTRATE 50 MG PO TABS
50.0000 mg | ORAL_TABLET | Freq: Two times a day (BID) | ORAL | Status: DC
  Administered 2015-03-09 – 2015-03-12 (×6): 50 mg via ORAL
  Filled 2015-03-09 (×7): qty 1

## 2015-03-09 MED ORDER — POLYETHYLENE GLYCOL 3350 17 G PO PACK
17.0000 g | PACK | Freq: Every day | ORAL | Status: DC | PRN
  Filled 2015-03-09: qty 1

## 2015-03-09 MED ORDER — MIDAZOLAM HCL 2 MG/2ML IJ SOLN
INTRAMUSCULAR | Status: AC
  Filled 2015-03-09: qty 6

## 2015-03-09 MED ORDER — LIDOCAINE HCL 1 % IJ SOLN
INTRAMUSCULAR | Status: AC
  Filled 2015-03-09: qty 20

## 2015-03-09 MED ORDER — LIDOCAINE-EPINEPHRINE 2 %-1:100000 IJ SOLN
INTRAMUSCULAR | Status: AC
Start: 2015-03-09 — End: 2015-03-10
  Filled 2015-03-09: qty 1

## 2015-03-09 MED ORDER — POTASSIUM CHLORIDE CRYS ER 20 MEQ PO TBCR
40.0000 meq | EXTENDED_RELEASE_TABLET | Freq: Four times a day (QID) | ORAL | Status: AC
  Administered 2015-03-09 (×2): 40 meq via ORAL
  Filled 2015-03-09 (×2): qty 2

## 2015-03-09 MED ORDER — VANCOMYCIN HCL IN DEXTROSE 1-5 GM/200ML-% IV SOLN
1000.0000 mg | INTRAVENOUS | Status: AC
  Filled 2015-03-09: qty 200

## 2015-03-09 MED ORDER — OXYCODONE HCL 5 MG PO TABS
5.0000 mg | ORAL_TABLET | ORAL | Status: DC | PRN
  Administered 2015-03-09 – 2015-03-11 (×8): 10 mg via ORAL
  Filled 2015-03-09 (×8): qty 2

## 2015-03-09 MED ORDER — FENTANYL CITRATE (PF) 100 MCG/2ML IJ SOLN
INTRAMUSCULAR | Status: AC | PRN
  Administered 2015-03-09 (×3): 25 ug via INTRAVENOUS
  Administered 2015-03-09: 50 ug via INTRAVENOUS

## 2015-03-09 NOTE — Procedures (Signed)
Successful RT IJ POWER PORT TIP SVC/RA NO COMP STABLE ACCESSED AND READY FOR USE

## 2015-03-09 NOTE — Progress Notes (Addendum)
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 She had a similar presentation on 02/17/2015 with symptoms resolving after bowel rest, analgesia and as needed antiemetic therapy. Her oncologist Dr.Feng evaluated patient during this hospitalization, felt that nausea and vomiting could be related to underlying malignancy vs SBO. Patient's symptoms improved by the following day with resolution to nausea and vomiting. Pain symptoms also improving. Dr Deitra Mayo of Palliative Medicine was consulted for assistance in management of symptoms. She was started on decadron along with oxycontin 30 mg PO BID which seemed to help out her pain. Her diet was advanced to clears. During this hospitalization interventional radiology placed power port, which was ordered by her oncologist.  Assessment/Plan: 1. Metastatic adenocarcinoma of lung. -Patient having a history of well-differentiated adenocarcinoma of lung undergoing lobectomy in 2014. Found to have widespread metastatic disease.  -Recent biopsy of suspicious lesion involving left iliac crest revealed adenocarcinoma having similar morphologic features to previous right lung adenocarcinoma. -Patient was evaluated by medical oncology, may not be good candidate for systemic chemotherapy. -Palliative radiation therapy a consideration. -Patient undergoing Port-A-Cath placement on 03/09/2015.  2.  Nausea/vomiting. -Symptoms improving, could be secondary to underlying malignancy. She underwent workup on a recent hospitalization undergoing esophagram on 02/18/2015 which did  not show structural abnormality. -Symptoms improved by this afternoon, advancing her diet to clears -On physical exam her abdomen was soft and nondistended. Continue as needed IV antiemetic therapy  3.  Abdominal pain. -Symptoms improved, suspect related to underlying malignancy. On recent hospitalization for similar symptoms CT scan of abdomen and pelvis with IV contrast showed normal liver, gallbladder, without evidence of pancreatic mass. It was negative for bowel obstruction.  -Patient have a small bowel movement -Palliative medicine consulted for assistance with pain management -Given improvement advancing diet to clears today.  4.  Diabetes mellitus. -Her metformin was held on admission given nothing by mouth status. Continue Accu-Cheks every 4 hours with slight scale coverage -I thought about possibility of diabetic gastroparesis contributing to nausea/vomiting. She was started on Reglan 5 mg IV 3 times a day.  5.  Hypertension. -Systolic blood pressures remained in the 140s to 160s, will increase metoprolol to 50 mg by mouth twice a day  6.  Cancer related pain. -Palliative care was consulted, she was started on oxycodone 30 mg every 12 hours, Decadron 4 mg by mouth daily. Continue oxycodone 5-10 mg every 3 years as needed for severe breakthrough pain along with antibiotics or 0.5 mg every 4 hours as needed for severe breakthrough pain.  7. Hypokalemia -Am labs showing potassium of 3.0, she was given Kdur 40 meq  Code Status: Full code Family Communication: Family not present Disposition Plan: Advancing diet to clears today, continue supportive care, IV fluid resuscitation, reassess in a.m.   Consultants:  Heme/Onc  Palliative care   HPI/Subjective: Patient seems better, is awake and alert, reports improvement to nausea.   Objective: Filed Vitals:   03/09/15 1630  BP: 151/90  Pulse: 80  Temp: 98.6 F (37 C)  Resp: 16    Intake/Output Summary (Last 24 hours) at  03/09/15 1702 Last data filed at 03/09/15 1610  Gross per 24  hour  Intake      0 ml  Output   1550 ml  Net  -1550 ml   There were no vitals filed for this visit.  Exam:   General:  She is awake and alert, following commands, cooperative.   Cardiovascular: RRR normal S1S2, no extremity edema  Respiratory: Lungs are clear to ausultation  Abdomen: Her abdomen is soft, having mild generalized pain to palpation  Musculoskeletal: no edema  Data Reviewed: Basic Metabolic Panel:  Recent Labs Lab 03/08/15 0914 03/09/15 0455  NA 136 144  K 3.6 3.0*  CL 102 107  CO2 26 26  GLUCOSE 129* 107*  BUN 13 13  CREATININE 0.54 0.44*  CALCIUM 9.4 9.1   Liver Function Tests:  Recent Labs Lab 03/08/15 0914 03/09/15 0455  AST 24 24  ALT 12 12  ALKPHOS 272* 249*  BILITOT 0.7 0.9  PROT 7.2 7.0  ALBUMIN 4.0 3.9    Recent Labs Lab 03/08/15 0914  LIPASE 18   No results for input(s): AMMONIA in the last 168 hours. CBC:  Recent Labs Lab 03/04/15 1028 03/08/15 0914 03/09/15 0455  WBC 7.1 7.0 8.4  NEUTROABS  --  5.2  --   HGB 12.8 12.9 12.1  HCT 39.6 40.0 37.6  MCV 91.7 89.9 90.4  PLT 182 184 194   Cardiac Enzymes: No results for input(s): CKTOTAL, CKMB, CKMBINDEX, TROPONINI in the last 168 hours. BNP (last 3 results) No results for input(s): BNP in the last 8760 hours.  ProBNP (last 3 results) No results for input(s): PROBNP in the last 8760 hours.  CBG:  Recent Labs Lab 03/08/15 2015 03/09/15 0013 03/09/15 0424 03/09/15 0740 03/09/15 1208  GLUCAP 156* 117* 107* 139* 99    No results found for this or any previous visit (from the past 240 hour(s)).   Studies: Ir Fluoro Guide Cv Line Right  03/09/2015   CLINICAL DATA:  METASTATIC LUNG CANCER  EXAM: RIGHT INTERNAL JUGULAR SINGLE LUMEN POWER PORT CATHETER INSERTION  Date:  4/19/20164/19/2016 3:51 pm  Radiologist:  M. Daryll Brod, MD  Guidance:  Ultrasound fluoroscopic  FLUOROSCOPY TIME:  30 seconds, 3 mGy   MEDICATIONS AND MEDICAL HISTORY: [name] already receiving vancomycin earlier today. This was reviewed with pharmacy. No further antibiotics were necessary.  2 mg Versed  ANESTHESIA/SEDATION: 28 minutes  CONTRAST:  None.  COMPLICATIONS: None immediate  PROCEDURE: Informed consent was obtained from the patient following explanation of the procedure, risks, benefits and alternatives. The patient understands, agrees and consents for the procedure. All questions were addressed. A time out was performed.  Maximal barrier sterile technique utilized including caps, mask, sterile gowns, sterile gloves, large sterile drape, hand hygiene, and 2% chlorhexidine scrub.  Under sterile conditions and local anesthesia, right internal jugular micropuncture venous access was performed. Access was performed with ultrasound. Images were obtained for documentation. A guide wire was inserted followed by a transitional dilator. This allowed insertion of a guide wire and catheter into the IVC. Measurements were obtained from the SVC / RA junction back to the right IJ venotomy site. In the right infraclavicular chest, a subcutaneous pocket was created over the second anterior rib. This was done under sterile conditions and local anesthesia. 1% lidocaine with epinephrine was utilized for this. A 2.5 cm incision was made in the skin. Blunt dissection was performed to create a subcutaneous pocket over the right pectoralis major muscle. The pocket was flushed with saline vigorously. There was adequate hemostasis. The port catheter  was assembled and checked for leakage. The port catheter was secured in the pocket with two retention sutures. The tubing was tunneled subcutaneously to the right venotomy site and inserted into the SVC/RA junction through a valved peel-away sheath. Position was confirmed with fluoroscopy. Images were obtained for documentation. The patient tolerated the procedure well. No immediate complications. Incisions were  closed in a two layer fashion with 4 - 0 Vicryl suture. Dermabond was applied to the skin. The port catheter was accessed, blood was aspirated followed by saline and heparin flushes. Needle was removed. A dry sterile dressing was applied.  IMPRESSION: Ultrasound and fluoroscopically guided right internal jugular single lumen power port catheter insertion. Tip in the SVC/RA junction. Catheter ready for use.   Electronically Signed   By: Jerilynn Mages.  Shick M.D.   On: 03/09/2015 16:23   Ir US Guide Vasc Access Right  03/09/2015   CLINICAL DATA:  METASTATIC LUNG CANCER  EXAM: RIGHT INTERNAL JUGULAR SINGLE LUMEN POWER PORT CATHETER INSERTION  Date:  4/19/20164/19/2016 3:51 pm  Radiologist:  M. Daryll Brod, MD  Guidance:  Ultrasound fluoroscopic  FLUOROSCOPY TIME:  30 seconds, 3 mGy  MEDICATIONS AND MEDICAL HISTORY: [name] already receiving vancomycin earlier today. This was reviewed with pharmacy. No further antibiotics were necessary.  2 mg Versed  ANESTHESIA/SEDATION: 28 minutes  CONTRAST:  None.  COMPLICATIONS: None immediate  PROCEDURE: Informed consent was obtained from the patient following explanation of the procedure, risks, benefits and alternatives. The patient understands, agrees and consents for the procedure. All questions were addressed. A time out was performed.  Maximal barrier sterile technique utilized including caps, mask, sterile gowns, sterile gloves, large sterile drape, hand hygiene, and 2% chlorhexidine scrub.  Under sterile conditions and local anesthesia, right internal jugular micropuncture venous access was performed. Access was performed with ultrasound. Images were obtained for documentation. A guide wire was inserted followed by a transitional dilator. This allowed insertion of a guide wire and catheter into the IVC. Measurements were obtained from the SVC / RA junction back to the right IJ venotomy site. In the right infraclavicular chest, a subcutaneous pocket was created over the second  anterior rib. This was done under sterile conditions and local anesthesia. 1% lidocaine with epinephrine was utilized for this. A 2.5 cm incision was made in the skin. Blunt dissection was performed to create a subcutaneous pocket over the right pectoralis major muscle. The pocket was flushed with saline vigorously. There was adequate hemostasis. The port catheter was assembled and checked for leakage. The port catheter was secured in the pocket with two retention sutures. The tubing was tunneled subcutaneously to the right venotomy site and inserted into the SVC/RA junction through a valved peel-away sheath. Position was confirmed with fluoroscopy. Images were obtained for documentation. The patient tolerated the procedure well. No immediate complications. Incisions were closed in a two layer fashion with 4 - 0 Vicryl suture. Dermabond was applied to the skin. The port catheter was accessed, blood was aspirated followed by saline and heparin flushes. Needle was removed. A dry sterile dressing was applied.  IMPRESSION: Ultrasound and fluoroscopically guided right internal jugular single lumen power port catheter insertion. Tip in the SVC/RA junction. Catheter ready for use.   Electronically Signed   By: Jerilynn Mages.  Shick M.D.   On: 03/09/2015 16:23    Scheduled Meds: . ALPRAZolam  0.5 mg Oral BID  . dexamethasone  4 mg Oral Daily  . fentaNYL      . gabapentin  400  mg Oral TID  . heparin  5,000 Units Subcutaneous 3 times per day  . insulin aspart  0-15 Units Subcutaneous 6 times per day  . lidocaine      . lidocaine-EPINEPHrine      . metoCLOPramide (REGLAN) injection  5 mg Intravenous 3 times per day  . metoprolol tartrate  25 mg Oral BID  . midazolam      . OXcarbazepine  150 mg Oral TID  . OxyCODONE  30 mg Oral Q12H  . senna-docusate  2 tablet Oral BID  . sodium chloride  3 mL Intravenous Q12H  . vancomycin  1,000 mg Intravenous On Call   Continuous Infusions: . sodium chloride 100 mL/hr at 03/08/15  1731    Principal Problem:   Metastatic adenocarcinoma Active Problems:   Nausea with vomiting   Lung cancer, right upper lobe   Hypertension   Metastatic primary lung cancer   Cancer associated pain   CN (constipation)    Time spent: 35 min    Kelvin Cellar  [facility] Pager [phone]. If 7PM-7AM, please contact night-coverage at [email or web address], password [facility] 03/09/2015, 5:02 PM  LOS: 1 day

## 2015-03-09 NOTE — Progress Notes (Signed)
Pt was ordered 1g of Vancomycin for IR (port-a-cath placement) procedure 03/09/15. When reviewing pts MAR, pt received medication at [phone]. Pharmacy was contacted about medication and d/t pts weight and dose ordered it was not advised to give an additional 1g dose on-call for procedure unless procedure was scheduled 12hrs from first dose. Spoke with pharmacy about SCIP policy per [facility] and pharmacist said that pt should be covered for procedure as long as the procedure was completed prior to 1800. Dr Annamaria Boots and Rowe Robert, PA were both informed and both agreed with pharmacy. No additional dose was given prior to procedure.

## 2015-03-09 NOTE — Consult Note (Signed)
Patient [name]      DOB: [birth year]      GNF:[id number]     Consult Note from the Palliative Medicine Team at [place] Requested by: Dr Coralyn Pear     PCP: Harvie Junior, MD Reason for Consultation: Symptom Management     Phone Number:[phone]  Assessment/Recommendations: 64 yo female with Dm, Chronic back/abd pain, Bipolar, Hep C, stage I NSCLC with recent diffuse osseous metastatic disease presumed 2/2 primary lung CA recurrence.  Admitted with uncontrolled pain, N/V.   1.  Code Status: listed as full, I did not discuss  2. GOC: [name] [name] is upset with the way she was told she had metastatic disease.  In describing this to me in general she became very upset.  I am not even sure the specifics of situation.  It was hard for her to move past that, and this will likely take further conversation in follow-up as she allows.   3. Symptom Management:   1. Cancer Related Pain: Reviewed [place] CSRS and she does not have any suspicious prescribing patterns. Recently prescribed hydrocodone '10mg'$  and previously oxycodone '10mg'$ . Suspect this pain is largely driven by bone mets.  Has MRI scheduled to further evaluate T/C spine.  I will add steroids to target bone pain (hopefully will not impact sugars as much and will use a little lower dose). Not sure how much radiation would improve symptoms given apparent extent of her disease. In less than 24hrs here, she has been given '8mg'$  of IV dilaudid (roughyl equivalent to '105mg'$  of oxycodone).  I think she will need sustained release medication as pain fairly constant and using such large opioid doses. I also think anxiety is probably driving some of her pain as well. She has morphine allergy (hives), so will go ahead and try oxycontin, oxycodone.  Will dose oxycontin at '30mg'$  BID (dose reduced for ICT) and oxycodone 5-'10mg'$  PRN.  I will also decrease IV dilaudid to 0.'5mg'$  which reflects more of a 10-20% of opioid total daily dose.    2. Nausea/Vomiting: on reglan and zofran.  Addition of steroids and pain control may help here as well.   Tx of constipation.  3. Constipation: Had small, loose BM yesterday but has been hard for her to move bowels.  Will start regimen here, especially with increased opioid use.   4. Anxiety/Bipolar: On alprazolam as she was as outpatient. Again no suspicious refill patterns on [place] CSRS.    4. Psychosocial/Spiritual:  Brief HPI:  64 yo female with PMHx of DM Chronic back/abd pain-h/o lumbar fusion, Bipolar, Hep C, stage I NSCLC (adenoCA, s/p lobectomy in 2014) who was admitted from Oncology clinic yesterday with uncontrolled pain and severe nausea/vomiting.  She was recently admitted few weeks ago with similar symptoms and also recently discovered to have diffuse bony lesions concerning for metastatic malignancy.  She underwent biopsy of one of these lesions which confirmed adenocarcinoma with presumed lung primary.   She has also had associated wt loss, cough, and constipation.  She has been receiving IV dilaudid here as well as parenteral anti-emetics. Today she states she is still having significant pain in her lower back, with radiation into her rectum, vagina, lower abdomen. Similar to prior pain but worse. IV dilaudid helping, but only lasting 1-2hrs.  Hard for her to describe any exacerbating factors. States pain is sharp, but does not describe any burning, N/T.  She has previously been on outpatient hydrocodone and oxycodone in the past for pain  control. Oxycodone '10mg'$  previously made her sleepy, but pain worse now.  Some control of nausea with anti-emetics but still symptomatic.  Moved bowels some yesterday but having trouble with constipation as well.  Upset with how she was told about metastatic disease and describing this made her upset today to point where she did not want to talk with me more extensively about her cancer or symptoms.         PMH:  Past Medical History  Diagnosis Date   . Hypertension   . Bipolar 1 disorder   . Heart murmur   . Sciatica   . Arthritis   . Anxiety   . Diverticulosis   . Chronic low back pain   . DDD (degenerative disc disease), lumbar   . Chronic shoulder pain   . Chronic neck pain   . Hepatitis B     Unclear when initially diagnosed, labs in Epic from 03/09/13  . Hepatitis C     Unclear when initially diagnosed, labs in Epic from 03/09/13  . C. difficile diarrhea     April and February 2014  . COPD (chronic obstructive pulmonary disease)   . GERD (gastroesophageal reflux disease)   . H/O hiatal hernia   . Headache(784.0)     hx  . PONV (postoperative nausea and vomiting)   . Seizures     on meds  . Diabetes mellitus   . Adenocarcinoma of lung, stage 1      PSH: Past Surgical History  Procedure Laterality Date  . Facial tumor removal Right 2000  . Brain tumor removal  09  . Brain surgery      in [place]  . Video bronchoscopy  12/04/2012    Procedure: VIDEO BRONCHOSCOPY;  Surgeon: Grace Isaac, MD;  Location: [facility] OR;  Service: Thoracic;  Laterality: N/A;  . Video assisted thoracoscopy (vats)/wedge resection  12/04/2012    Procedure: VIDEO ASSISTED THORACOSCOPY (VATS)/WEDGE RESECTION;  Surgeon: Grace Isaac, MD;  Location: [facility];  Service: Thoracic;  Laterality: Right;  . Lobectomy  12/04/2012    Procedure: LOBECTOMY;  Surgeon: Grace Isaac, MD;  Location: [facility];  Service: Thoracic;  Laterality: Right;  completion of right upper lobectomy and lymph node disection, placement of on q pump  . Tubal ligation    . Tonsillectomy    . Anterior cervical decomp/discectomy fusion N/A 03/05/2014    Procedure: ANTERIOR CERVICAL DECOMPRESSION/DISCECTOMY FUSION 2 LEVELS;  Surgeon: Sinclair Ship, MD;  Location: [facility];  Service: Orthopedics;  Laterality: N/A;  Anterior cervical decompression fusion, cervical 4-5, cervical 5-6 with instrumentation, allograft.   I have reviewed the [place] and SH and  If appropriate  update it with new information. Allergies  Allergen Reactions  . Haldol [Haloperidol Decanoate] Other (See Comments)    tongue swelling  . Acetaminophen Other (See Comments)    Stomach pain  . Ibuprofen Other (See Comments)    Stomach pain  . Metronidazole Other (See Comments)    Severe stomach upset--was instructed to avoid Flagyl.  . Morphine And Related Hives    Tolerates hydromorphone.  Marland Kitchen Penicillins Rash   Scheduled Meds: . ALPRAZolam  0.5 mg Oral BID  . dexamethasone  4 mg Oral Daily  . gabapentin  400 mg Oral TID  . heparin  5,000 Units Subcutaneous 3 times per day  . hydrOXYzine  25 mg Oral Daily  . insulin aspart  0-15 Units Subcutaneous 6 times per day  . metoCLOPramide (REGLAN) injection  5 mg Intravenous 3 times per day  . metoprolol tartrate  25 mg Oral BID  . OXcarbazepine  150 mg Oral TID  . OxyCODONE  30 mg Oral Q12H  . potassium chloride  40 mEq Oral Q6H  . senna-docusate  2 tablet Oral BID  . sodium chloride  3 mL Intravenous Q12H   Continuous Infusions: . sodium chloride 100 mL/hr at 03/08/15 1731   PRN Meds:.alum & mag hydroxide-simeth, HYDROmorphone (DILAUDID) injection, ipratropium-albuterol, ondansetron **OR** ondansetron (ZOFRAN) IV, oxyCODONE, polyethylene glycol, tiZANidine    BP 161/71 mmHg  Pulse 76  Temp(Src) 98.8 F (37.1 C) (Oral)  Resp 16  Ht '5\' 4"'$  (1.626 m)  SpO2 96%   PPS: 40-50   Intake/Output Summary (Last 24 hours) at 03/09/15 0959 Last data filed at 03/08/15 2337  Gross per 24 hour  Intake      0 ml  Output    350 ml  Net   -350 ml    Physical Exam:  General:  Alert, NAD HEENT:  [place], sclera anicteric, mmm Neck: supple Chest:   CTAB CVS: RRR Abdomen: soft, ND Ext: warm  Labs: CBC    Component Value Date/Time   WBC 8.4 03/09/2015 0455   WBC 7.5 02/11/2015 1610   RBC 4.16 03/09/2015 0455   RBC 4.38 02/11/2015 1610   HGB 12.1 03/09/2015 0455   HGB 12.6 02/11/2015 1610   HCT 37.6 03/09/2015 0455   HCT 39.1  02/11/2015 1610   PLT 194 03/09/2015 0455   PLT 246 02/11/2015 1610   MCV 90.4 03/09/2015 0455   MCV 89.2 02/11/2015 1610   MCH 29.1 03/09/2015 0455   MCH 28.7 02/11/2015 1610   MCHC 32.2 03/09/2015 0455   MCHC 32.2 02/11/2015 1610   RDW 13.8 03/09/2015 0455   RDW 13.5 02/11/2015 1610   LYMPHSABS 1.3 03/08/2015 0914   LYMPHSABS 2.1 02/11/2015 1610   MONOABS 0.5 03/08/2015 0914   MONOABS 0.5 02/11/2015 1610   EOSABS 0.0 03/08/2015 0914   EOSABS 0.1 02/11/2015 1610   BASOSABS 0.0 03/08/2015 0914   BASOSABS 0.1 02/11/2015 1610    BMET    Component Value Date/Time   NA 144 03/09/2015 0455   K 3.0* 03/09/2015 0455   CL 107 03/09/2015 0455   CO2 26 03/09/2015 0455   GLUCOSE 107* 03/09/2015 0455   BUN 13 03/09/2015 0455   CREATININE 0.44* 03/09/2015 0455   CALCIUM 9.1 03/09/2015 0455   GFRNONAA >90 03/09/2015 0455   GFRAA >90 03/09/2015 0455    CMP     Component Value Date/Time   NA 144 03/09/2015 0455   K 3.0* 03/09/2015 0455   CL 107 03/09/2015 0455   CO2 26 03/09/2015 0455   GLUCOSE 107* 03/09/2015 0455   BUN 13 03/09/2015 0455   CREATININE 0.44* 03/09/2015 0455   CALCIUM 9.1 03/09/2015 0455   PROT 7.0 03/09/2015 0455   ALBUMIN 3.9 03/09/2015 0455   AST 24 03/09/2015 0455   ALT 12 03/09/2015 0455   ALKPHOS 249* 03/09/2015 0455   BILITOT 0.9 03/09/2015 0455   GFRNONAA >90 03/09/2015 0455   GFRAA >90 03/09/2015 0455   4/6 CT Chest IMPRESSION: 1. No compelling new pulmonary mass is identified as a primary site for the diffuse osseous metastatic disease. However, new osseous metastatic lesions are present throughout the thoracic spine, sternum, and some of the ribs. 2. Very faint ground-glass density 5 mm pulmonary nodule in the left lower lobe, questionable change compared back through 12/28/2012. This may  merit observation.   03/01/15 MRI Head IMPRESSION: 1. Diffuse osseous metastases involving the skull and visualized upper cervical spine as  above. 2. No parenchymal brain mass identified, however evaluation for small lesions is limited by lack of IV contrast. Postcontrast brain MRI is recommended when/if IV access is obtained. 3. Unchanged left frontal encephalomalacia.  Total Time: 65 minutes Greater than 50%  of this time was spent counseling and coordinating care related to the above assessment and plan.  Doran Clay D.O. Palliative Medicine Team at [facility]  Pager: [phone] Team Phone: [phone]               ;

## 2015-03-09 NOTE — Progress Notes (Signed)
Radiation Oncology         [phone] ________________________________  Initial Inpatient Consultation  Name: [name] [name] MRN: [id number]  Date: 03/09/2015  DOB: [birth year]  JA:[id number],BHALPF M, MD  Truitt Merle, MD   REFERRING PHYSICIAN: Truitt Merle, MD  DIAGNOSIS: Metastatic adenocarcinoma the lung  HISTORY OF PRESENT ILLNESS::[name] [name] is a 64 y.o. female who is seen out of the courtesy of Dr Burr Medico for an opinion concerning radiation therapy as part of management of patient's metastatic adenocarcinoma lung. The patient has a prior history of stage I adenocarcinoma the lung for which the patient underwent surgery in 2014.  She underwent right video-assisted thoracoscopy, mini thoracotomy and wedge resection with completion right upper lobe lobectomy and lymph node sampling by Dr. Servando Snare. Patient did well until this year when she began having diffuse pain complaints. She ultimately was found to have diffuse osseous metastasis on recent scans. A biopsy of pelvic lesion revealed adenocarcinoma consistent with the patient's prior lung cancer. [name] recently been admitted for uncontrolled nausea and vomiting as well as diffuse pain.   radiation therapy is been consulted for consideration for palliative treatments related to her osseous metastasis.Marland Kitchen  PREVIOUS RADIATION THERAPY: No  PAST MEDICAL HISTORY:  has a past medical history of Hypertension; Bipolar 1 disorder; Heart murmur; Sciatica; Arthritis; Anxiety; Diverticulosis; Chronic low back pain; DDD (degenerative disc disease), lumbar; Chronic shoulder pain; Chronic neck pain; Hepatitis B; Hepatitis C; C. difficile diarrhea; COPD (chronic obstructive pulmonary disease); GERD (gastroesophageal reflux disease); H/O hiatal hernia; Headache(784.0); PONV (postoperative nausea and vomiting); Seizures; Diabetes mellitus; and Adenocarcinoma of lung, stage 1.    PAST SURGICAL HISTORY: Past Surgical History  Procedure Laterality Date  .  Facial tumor removal Right 2000  . Brain tumor removal  09  . Brain surgery      in [place]  . Video bronchoscopy  12/04/2012    Procedure: VIDEO BRONCHOSCOPY;  Surgeon: Grace Isaac, MD;  Location: [facility] OR;  Service: Thoracic;  Laterality: N/A;  . Video assisted thoracoscopy (vats)/wedge resection  12/04/2012    Procedure: VIDEO ASSISTED THORACOSCOPY (VATS)/WEDGE RESECTION;  Surgeon: Grace Isaac, MD;  Location: [place];  Service: Thoracic;  Laterality: Right;  . Lobectomy  12/04/2012    Procedure: LOBECTOMY;  Surgeon: Grace Isaac, MD;  Location: [place];  Service: Thoracic;  Laterality: Right;  completion of right upper lobectomy and lymph node disection, placement of on q pump  . Tubal ligation    . Tonsillectomy    . Anterior cervical decomp/discectomy fusion N/A 03/05/2014    Procedure: ANTERIOR CERVICAL DECOMPRESSION/DISCECTOMY FUSION 2 LEVELS;  Surgeon: Sinclair Ship, MD;  Location: [place];  Service: Orthopedics;  Laterality: N/A;  Anterior cervical decompression fusion, cervical 4-5, cervical 5-6 with instrumentation, allograft.    FAMILY HISTORY: family history includes Diabetes in her father and mother; Heart disease in her father; Hyperlipidemia in her brother, father, mother, and sister; Hypertension in her brother, father, mother, and sister.  SOCIAL HISTORY:  reports that she quit smoking about 20 months ago. Her smoking use included Cigarettes. She has a 2.5 pack-year smoking history. She has never used smokeless tobacco. She reports that she uses illicit drugs (Hydrocodone and Marijuana). She reports that she does not drink alcohol.  ALLERGIES: Haldol; Acetaminophen; Ibuprofen; Metronidazole; Morphine and related; and Penicillins  MEDICATIONS:  No current facility-administered medications for this encounter.   No current outpatient prescriptions on file.   Facility-Administered Medications Ordered in Other Encounters  Medication Dose  Route Frequency  Provider Last Rate Last Dose  . 0.9 %  sodium chloride infusion   Intravenous Continuous Kelvin Cellar, MD 100 mL/hr at 03/08/15 1731    . ALPRAZolam Duanne Moron) tablet 0.5 mg  0.5 mg Oral BID Kelvin Cellar, MD   0.5 mg at 03/09/15 1004  . alum & mag hydroxide-simeth (MAALOX/MYLANTA) 200-200-20 MG/5ML suspension 30 mL  30 mL Oral Q6H PRN Kelvin Cellar, MD      . dexamethasone (DECADRON) tablet 4 mg  4 mg Oral Daily Darrel Reach Lampkin, DO   4 mg at 03/09/15 1149  . fentaNYL (SUBLIMAZE) 100 MCG/2ML injection           . gabapentin (NEURONTIN) capsule 400 mg  400 mg Oral TID Kelvin Cellar, MD   400 mg at 03/09/15 1650  . heparin injection 5,000 Units  5,000 Units Subcutaneous 3 times per day Kelvin Cellar, MD   5,000 Units at 03/09/15 0612  . HYDROmorphone (DILAUDID) injection 0.5 mg  0.5 mg Intravenous Q4H PRN Darrel Reach Lampkin, DO   0.5 mg at 03/09/15 1637  . hydrOXYzine (ATARAX/VISTARIL) tablet 25 mg  25 mg Oral Q8H PRN Kelvin Cellar, MD      . insulin aspart (novoLOG) injection 0-15 Units  0-15 Units Subcutaneous 6 times per day Kelvin Cellar, MD   2 Units at 03/09/15 1722  . ipratropium-albuterol (DUONEB) 0.5-2.5 (3) MG/3ML nebulizer solution 3 mL  3 mL Inhalation Daily PRN Kelvin Cellar, MD      . lidocaine (XYLOCAINE) 1 % (with pres) injection           . lidocaine-EPINEPHrine (XYLOCAINE W/EPI) 2 %-1:100000 (with pres) injection           . metoCLOPramide (REGLAN) injection 5 mg  5 mg Intravenous 3 times per day Kelvin Cellar, MD   5 mg at 03/09/15 1342  . metoprolol (LOPRESSOR) tablet 50 mg  50 mg Oral BID Kelvin Cellar, MD      . midazolam (VERSED) 2 MG/2ML injection           . ondansetron (ZOFRAN) tablet 4 mg  4 mg Oral Q6H PRN Kelvin Cellar, MD       Or  . ondansetron (ZOFRAN) injection 4 mg  4 mg Intravenous Q6H PRN Kelvin Cellar, MD   4 mg at 03/09/15 0005  . OXcarbazepine (TRILEPTAL) tablet 150 mg  150 mg Oral TID Kelvin Cellar, MD   150 mg at 03/09/15 1650  .  oxyCODONE (Oxy IR/ROXICODONE) immediate release tablet 5-10 mg  5-10 mg Oral Q3H PRN Darrel Reach Lampkin, DO   10 mg at 03/09/15 1341  . OxyCODONE (OXYCONTIN) 12 hr tablet 30 mg  30 mg Oral Q12H Darrel Reach Lampkin, DO   30 mg at 03/09/15 1004  . polyethylene glycol (MIRALAX / GLYCOLAX) packet 17 g  17 g Oral Daily PRN Darrel Reach Lampkin, DO      . senna-docusate (Senokot-S) tablet 2 tablet  2 tablet Oral BID Darrel Reach Lampkin, DO   2 tablet at 03/09/15 1000  . sodium chloride 0.9 % injection 3 mL  3 mL Intravenous Q12H Kelvin Cellar, MD   3 mL at 03/08/15 2252  . tiZANidine (ZANAFLEX) tablet 4 mg  4 mg Oral Q8H PRN Kelvin Cellar, MD      . vancomycin (VANCOCIN) IVPB 1000 mg/200 mL premix  1,000 mg Intravenous On Call Darrell K Allred, PA-C   1,000 mg at 03/09/15 1506    REVIEW OF SYSTEMS:  A 15  point review of systems is documented in the electronic medical record. This was obtained by the nursing staff. However, I reviewed this with the patient to discuss relevant findings and make appropriate changes. This evening the patient complains of diffuse pain involving her shoulders chest abdomen pelvis and leg areas. She feels that the most bothersome areas is her left buttocks region. She case we will noticed some weakness in her left lower extremity but nothing on a consistent basis. She denies any headaches this evening. She denies any cough or breathing problems no history of hemoptysis.   PHYSICAL EXAM:  Vitals - 1 value per visit 8/56/3149  SYSTOLIC 702  DIASTOLIC 90  Pulse 80  Temperature 98.6  Respirations 16  Weight (lb)   Height   BMI   VISIT REPORT    This is a pleasant 64 year old female lying in her hospital bed. She is quite talkative this evening. Examination of the neck and subclavicular region reveals no evidence of adenopathy. The axillary areas are free of adenopathy. Examination of the lungs reveals them to be clear. The heart has a regular rhythm and rate. The abdomen is soft and  nontender with normal bowel sounds. Patient has a Port-A-Cath in place recently placed in the right upper chest region. On neurological examination motor strength is 5 out of 5 in the proximal and distal muscle groups of the upper lower extremities.  peripheral pulses are good. No significant edema noted. Palpation along the left buttocks area reveals some mild tenderness but no palpable mass.   ECOG = 2  2 - Symptomatic, <50% in bed during the day (Ambulatory and capable of all self care but unable to carry out any work activities. Up and about more than 50% of waking hours)  LABORATORY DATA:  Lab Results  Component Value Date   WBC 8.4 03/09/2015   HGB 12.1 03/09/2015   HCT 37.6 03/09/2015   MCV 90.4 03/09/2015   PLT 194 03/09/2015   NEUTROABS 5.2 03/08/2015   Lab Results  Component Value Date   NA 144 03/09/2015   K 3.0* 03/09/2015   CL 107 03/09/2015   CO2 26 03/09/2015   GLUCOSE 107* 03/09/2015   CREATININE 0.44* 03/09/2015   CALCIUM 9.1 03/09/2015      RADIOGRAPHY: Dg Chest 2 View  02/17/2015   CLINICAL DATA:  Abdominal pain and emesis  EXAM: CHEST  2 VIEW  COMPARISON:  03/05/2014  FINDINGS: Normal heart size and stable aortic and hilar contours. There is unchanged distortion of right hilar architecture related to upper lobectomy. There is no edema, consolidation, effusion, or pneumothorax. Mild thoracic dextroscoliosis. No acute osseous findings.  IMPRESSION: 1. No active cardiopulmonary disease. 2. Right upper lobectomy.   Electronically Signed   By: Monte Fantasia M.D.   On: 02/17/2015 07:17   Ct Chest Wo Contrast  02/24/2015   CLINICAL DATA:  Right upper lobectomy for cancer. MRI on 02/02/2015 showed diffuse osseous metastatic disease.  EXAM: CT CHEST WITHOUT CONTRAST  TECHNIQUE: Multidetector CT imaging of the chest was performed following the standard protocol without IV contrast.  COMPARISON:  Multiple exams, including 02/17/2015 and 09/10/2014  FINDINGS:  Mediastinum/Nodes: Right upper paratracheal lymph node 0.8 cm on image 10, series 2, not well shown previously. Currently no pathologic thoracic adenopathy is identified. Thyroid gland unremarkable. Aortic atherosclerotic calcification noted.  Lungs/Pleura: Postoperative findings right upper lobectomy. Emphysema. Minimal lingular scarring and minimal left lower lobe scarring anteriorly. No recurrent lung mass. No pleural mass identified.  6 mm ground-glass density nodule on image 22 of series 603 in the left lower lobe, equivocally questionably enlarging compared to 12/28/12.  Upper abdomen: Mild extrahepatic biliary dilatation, up to 9 mm, significance uncertain. This is not dissimilar to prior exams. Gastrohepatic ligament lymph node 0.9 cm in short axis, image 54 series 2. (Previously the same).  Musculoskeletal: Widespread osseous metastatic disease primarily manifesting as multifocal variable density (some sclerotic, some lytic) lesions less readily seen in the spine and sternum, although some slight rib endosteal irregularities can also be appreciated on care for review. A manubrial lucent lesion in the sternum measures 2.0 by 1.0 cm on image 49 of series 603, and was not present at all on the prior chest CT. The spine lesions and other bony lesions are also not readily apparent on prior CT, indicating a rapid onset. An index lytic lesion in the T5 vertebral body measures 1.5 by 1.1 by 1.3 cm, and all the though the base of vertebral plexus extends towards this lesion I do not see definite extension outside of the vertebral body.  IMPRESSION: 1. No compelling new pulmonary mass is identified as a primary site for the diffuse osseous metastatic disease. However, new osseous metastatic lesions are present throughout the thoracic spine, sternum, and some of the ribs. 2. Very faint ground-glass density 5 mm pulmonary nodule in the left lower lobe, questionable change compared back through 12/28/2012. This may merit  observation.   Electronically Signed   By: Van Clines M.D.   On: 02/24/2015 21:39   Mr Brain Wo Contrast  03/01/2015   CLINICAL DATA:  Osseous metastatic disease. Prior right upper lobectomy for lung cancer.  EXAM: MRI HEAD WITHOUT CONTRAST  TECHNIQUE: Multiplanar, multiecho pulse sequences of the brain and surrounding structures were obtained without intravenous contrast.  COMPARISON:  08/27/2012  FINDINGS: IV access could not be obtained despite multiple attempts by the IV team. Contrast could therefore not be administered. Coronal T2 weighted images were not performed as the patient was in pain and could not tolerate further imaging.  Bone marrow signal of the skull and visualized upper cervical spine is diffusely heterogeneous consistent with known widespread osseous metastases. A left parietal skull metastasis extends through the inner table and may involve the dura (series 9, image 34).  Sequelae of prior left frontal craniotomy are again identified. There is underlying left frontal lobe encephalomalacia with a small amount of chronic blood products present. Confluent T2 hyperintensity in the left frontal white matter in this region is unchanged and presumably reflects gliosis. There is no evidence of acute infarct, midline shift, or extra-axial fluid collection. There is mild generalized cerebral atrophy, within normal limits for age. Scattered, small foci of T2 hyperintensity in the cerebral white matter are similar to the prior MRI and nonspecific but compatible with mild chronic small vessel ischemic disease. No parenchymal brain mass identified, however evaluation is limited by lack of IV contrast.  Orbits are unremarkable. Paranasal sinuses are clear. No significant mastoid effusion is seen. Major intracranial vascular flow voids are preserved, with the distal right vertebral artery appearing hypoplastic.  IMPRESSION: 1. Diffuse osseous metastases involving the skull and visualized upper  cervical spine as above. 2. No parenchymal brain mass identified, however evaluation for small lesions is limited by lack of IV contrast. Postcontrast brain MRI is recommended when/if IV access is obtained. 3. Unchanged left frontal encephalomalacia.   Electronically Signed   By: Logan Bores   On: 03/01/2015 17:09   Ct  Abdomen Pelvis W Contrast  02/17/2015   CLINICAL DATA:  Acute abdominal pain.  History lung cancer  EXAM: CT ABDOMEN AND PELVIS WITH CONTRAST  TECHNIQUE: Multidetector CT imaging of the abdomen and pelvis was performed using the standard protocol following bolus administration of intravenous contrast.  CONTRAST:  120m OMNIPAQUE IOHEXOL 300 MG/ML  SOLN  COMPARISON:  CT abdomen 12/05/2014  FINDINGS: Lung bases are clear.  Hiatal hernia noted.  Normal liver. No liver mass lesion. Gallbladder normal. Common bile duct measures approximately 11 mm in diameter and is mildly enlarged. Correlate with liver function tests. Common bile duct slightly more prominent compared with the prior CT 12/05/2014. Pancreatic duct nondilated. No pancreatic mass. Negative for pancreatitis. Normal spleen.  Normal kidneys. No renal mass or obstruction. No renal calculi. Urinary bladder normal. Normal uterus which is retroverted.  Negative for bowel obstruction. No bowel thickening. Normal appendix.  Negative for free fluid.  No adenopathy identified.  Widespread bony lesions compatible with metastatic disease. These lesions are mostly sclerotic but there are some lesions which are lytic including the left posterior iliac wing. Bone lesions show rapid progression since the CT of 12/05/2014. Similar appearance on the MRI of 02/02/2015. Given history of stage I lung cancer, metastatic lung cancer is possible however also consider metastatic breast cancer. No pathologic fracture. Prior surgical fusion L5-S1 with pseudarthrosis and grade 1 anterior slip.  IMPRESSION: Common bile duct dilated 11 mm. Common duct stone not  excluded. No liver lesions  Widespread bony metastatic disease, with rapid progression since 12/05/2014. Given the sclerotic appearance of these lesions, consider breast cancer is well as metastatic lung cancer.   Electronically Signed   By: CFranchot GalloM.D.   On: 02/17/2015 10:15   Dg Esophagus  02/18/2015   CLINICAL DATA:  64year old female with history of dysphagia, complaining that solid foods get stuck for the past 5 months.  EXAM: ESOPHOGRAM/BARIUM SWALLOW  TECHNIQUE: Combined double contrast and single contrast examination performed using effervescent crystals, thick barium liquid, and thin barium liquid.  FLUOROSCOPY TIME:  If the device does not provide the exposure index:  Fluoroscopy Time:  3 minutes and 27 seconds.  Number of Acquired Images:  39  COMPARISON:  No priors.  FINDINGS: Initial double contrast images of the esophagus demonstrated a normal appearance of the esophageal mucosa. Multiple single swallow attempts were observed, with incomplete propagation of the primary peristaltic waves on every attempt. This was of variable severity, with various degrees of proximal escape ranging from moderate to mild. No tertiary contractions were noted. Full column esophagram demonstrated no esophageal mass, stricture or esophageal ring. No hiatal hernia. No gastroesophageal reflux was observed during a water siphon test. A barium tablet was administered, which passed readily into the stomach.  IMPRESSION: 1. Nonspecific esophageal motility disorder, as above. 2. The esophagus is structurally normal.   Electronically Signed   By: DVinnie LangtonM.D.   On: 02/18/2015 15:46   Ir Fluoro Guide Cv Line Right  03/09/2015   CLINICAL DATA:  METASTATIC LUNG CANCER  EXAM: RIGHT INTERNAL JUGULAR SINGLE LUMEN POWER PORT CATHETER INSERTION  Date:  4/19/20164/19/2016 3:51 pm  Radiologist:  M. TDaryll Brod MD  Guidance:  Ultrasound fluoroscopic  FLUOROSCOPY TIME:  30 seconds, 3 mGy  MEDICATIONS AND MEDICAL  HISTORY: [name] already receiving vancomycin earlier today. This was reviewed with pharmacy. No further antibiotics were necessary.  2 mg Versed  ANESTHESIA/SEDATION: 28 minutes  CONTRAST:  None.  COMPLICATIONS: None immediate  PROCEDURE:  Informed consent was obtained from the patient following explanation of the procedure, risks, benefits and alternatives. The patient understands, agrees and consents for the procedure. All questions were addressed. A time out was performed.  Maximal barrier sterile technique utilized including caps, mask, sterile gowns, sterile gloves, large sterile drape, hand hygiene, and 2% chlorhexidine scrub.  Under sterile conditions and local anesthesia, right internal jugular micropuncture venous access was performed. Access was performed with ultrasound. Images were obtained for documentation. A guide wire was inserted followed by a transitional dilator. This allowed insertion of a guide wire and catheter into the IVC. Measurements were obtained from the SVC / RA junction back to the right IJ venotomy site. In the right infraclavicular chest, a subcutaneous pocket was created over the second anterior rib. This was done under sterile conditions and local anesthesia. 1% lidocaine with epinephrine was utilized for this. A 2.5 cm incision was made in the skin. Blunt dissection was performed to create a subcutaneous pocket over the right pectoralis major muscle. The pocket was flushed with saline vigorously. There was adequate hemostasis. The port catheter was assembled and checked for leakage. The port catheter was secured in the pocket with two retention sutures. The tubing was tunneled subcutaneously to the right venotomy site and inserted into the SVC/RA junction through a valved peel-away sheath. Position was confirmed with fluoroscopy. Images were obtained for documentation. The patient tolerated the procedure well. No immediate complications. Incisions were closed in a two layer  fashion with 4 - 0 Vicryl suture. Dermabond was applied to the skin. The port catheter was accessed, blood was aspirated followed by saline and heparin flushes. Needle was removed. A dry sterile dressing was applied.  IMPRESSION: Ultrasound and fluoroscopically guided right internal jugular single lumen power port catheter insertion. Tip in the SVC/RA junction. Catheter ready for use.   Electronically Signed   By: Jerilynn Mages.  Shick M.D.   On: 03/09/2015 16:23   Ir US Guide Vasc Access Right  03/09/2015   CLINICAL DATA:  METASTATIC LUNG CANCER  EXAM: RIGHT INTERNAL JUGULAR SINGLE LUMEN POWER PORT CATHETER INSERTION  Date:  4/19/20164/19/2016 3:51 pm  Radiologist:  M. Daryll Brod, MD  Guidance:  Ultrasound fluoroscopic  FLUOROSCOPY TIME:  30 seconds, 3 mGy  MEDICATIONS AND MEDICAL HISTORY: [name] already receiving vancomycin earlier today. This was reviewed with pharmacy. No further antibiotics were necessary.  2 mg Versed  ANESTHESIA/SEDATION: 28 minutes  CONTRAST:  None.  COMPLICATIONS: None immediate  PROCEDURE: Informed consent was obtained from the patient following explanation of the procedure, risks, benefits and alternatives. The patient understands, agrees and consents for the procedure. All questions were addressed. A time out was performed.  Maximal barrier sterile technique utilized including caps, mask, sterile gowns, sterile gloves, large sterile drape, hand hygiene, and 2% chlorhexidine scrub.  Under sterile conditions and local anesthesia, right internal jugular micropuncture venous access was performed. Access was performed with ultrasound. Images were obtained for documentation. A guide wire was inserted followed by a transitional dilator. This allowed insertion of a guide wire and catheter into the IVC. Measurements were obtained from the SVC / RA junction back to the right IJ venotomy site. In the right infraclavicular chest, a subcutaneous pocket was created over the second anterior rib. This was  done under sterile conditions and local anesthesia. 1% lidocaine with epinephrine was utilized for this. A 2.5 cm incision was made in the skin. Blunt dissection was performed to create a subcutaneous pocket over the right pectoralis  major muscle. The pocket was flushed with saline vigorously. There was adequate hemostasis. The port catheter was assembled and checked for leakage. The port catheter was secured in the pocket with two retention sutures. The tubing was tunneled subcutaneously to the right venotomy site and inserted into the SVC/RA junction through a valved peel-away sheath. Position was confirmed with fluoroscopy. Images were obtained for documentation. The patient tolerated the procedure well. No immediate complications. Incisions were closed in a two layer fashion with 4 - 0 Vicryl suture. Dermabond was applied to the skin. The port catheter was accessed, blood was aspirated followed by saline and heparin flushes. Needle was removed. A dry sterile dressing was applied.  IMPRESSION: Ultrasound and fluoroscopically guided right internal jugular single lumen power port catheter insertion. Tip in the SVC/RA junction. Catheter ready for use.   Electronically Signed   By: Jerilynn Mages.  Shick M.D.   On: 03/09/2015 16:23   Ct Biopsy  03/04/2015   INDICATION: History of lung cancer, now with enlarging lytic lesion within the posterior aspect of the left iliac crest worrisome for metastatic disease. Please perform CT-guided biopsy for tissue diagnostic purposes.  EXAM: CT-GUIDED BIOPSY OF LYTIC LESION WITHIN THE POSTERIOR ASPECT OF THE LEFT ILIAC CREST  COMPARISON:  CT abdomen and pelvis - 02/17/2015; 12/05/2014  MEDICATIONS: Fentanyl 100 mcg IV; Versed 2 mg IV  ANESTHESIA/SEDATION: Sedation Time  17 minutes  CONTRAST:  None  COMPLICATIONS: None immediate.  PROCEDURE: Informed consent was obtained from the patient following an explanation of the procedure, risks, benefits and alternatives. The patient understands,  agrees and consents for the procedure. All questions were addressed. A time out was performed prior to the initiation of the procedure. The patient was positioned prone and non-contrast localization CT was performed of the pelvis demonstrating unchanged appearance of ill-defined approximately 1.0 x 1.3 cm lytic lesion within the posterior aspect of the left iliac crest (image 8, series 2). The operative site was prepped and draped in the usual sterile fashion.  Under sterile conditions and local anesthesia, a 22 gauge spinal needle was utilized for procedural planning. Next, an 11 gauge coaxial bone biopsy needle was advanced into the peripheral aspect of the lytic lesion. Needle position was confirmed with CT imaging and a bone biopsy was obtained with the 11 gauge outer bone marrow device.  The 11 gauge coaxial bone biopsy needle was then readvanced into the peripheral aspect of the lytic lesion. Needle positioning was confirmed with CT imaging and 2 bone lesion biopsies were obtained with the inner 13 gauge biopsy device. Finally, the 11 gauge coaxial bone biopsy needle was utilized to obtain a final sample.  Postprocedural imaging was obtained and was negative for complication, specifically, no evidence of hematoma formation. The needle was removed intact. Hemostasis was obtained with compression and a dressing was placed. The patient tolerated the procedure well without immediate post procedural complication.  IMPRESSION: Successful CT guided left iliac bone lesion core biopsies.   Electronically Signed   By: Sandi Mariscal M.D.   On: 03/04/2015 15:18   US Abdomen Limited Ruq  02/17/2015   CLINICAL DATA:  Generalized abdominal pain  EXAM: US ABDOMEN LIMITED - RIGHT UPPER QUADRANT  COMPARISON:  None.  FINDINGS: Gallbladder:  No gallstones or wall thickening visualized. No sonographic Murphy sign noted.  Common bile duct:  Diameter: 5.2 mm  Liver:  No focal lesion identified. Within normal limits in parenchymal  echogenicity.  IMPRESSION: Normal   Electronically Signed   By: Juanda Crumble  Carlis Abbott M.D.   On: 02/17/2015 14:20      IMPRESSION: Recurrent adenocarcinoma lung. As above the patient has diffuse osseous metastasis on recent imaging. Her most significant disease appears to be at T12 on MRI findings without any significant extraosseous extension or impending cord compression. Surprisingly the patient is not very symptomatic with pain in this region. Given the diffuse nature of the patient's pain it would be difficult to localize radiation field but if one were to choose her most significant area of pain is in the left pelvis region. May want to consider a short course of palliative radiation therapy directed at this area. Patient does tend to move around quite often during the evaluation and I'm unsure whether she may be able to hold still well during radiation treatment.  PLAN: Potential simulation directed at the left pelvis region April 20.     ------------------------------------------------  Blair Promise, PhD, MD

## 2015-03-09 NOTE — Progress Notes (Signed)
[name] [name]   DOB:07/04/1951   JJ#:[id number]   HBZ#:[id number]  Subjective: Pt feels much better today. Pain improved, less nausea, was trying to order dinner when I saw her. She had port placed this afternoon by IR.     Objective:  Filed Vitals:   03/09/15 1630  BP: 151/90  Pulse: 80  Temp: 98.6 F (37 C)  Resp: 16    There is no weight on file to calculate BMI.  Intake/Output Summary (Last 24 hours) at 03/09/15 1805 Last data filed at 03/09/15 1610  Gross per 24 hour  Intake      0 ml  Output   1400 ml  Net  -1400 ml     Sclerae unicteric  Oropharynx clear  No peripheral adenopathy  Lungs clear -- no rales or rhonchi  Heart regular rate and rhythm  Abdomen benign  MSK no focal spinal tenderness, no peripheral edema  Neuro nonfocal   CBG (last 3)   Recent Labs  03/09/15 0740 03/09/15 1208 03/09/15 1612  GLUCAP 139* 99 126*     Labs:  Lab Results  Component Value Date   WBC 8.4 03/09/2015   HGB 12.1 03/09/2015   HCT 37.6 03/09/2015   MCV 90.4 03/09/2015   PLT 194 03/09/2015   NEUTROABS 5.2 03/08/2015    '@LASTCHEMISTRY'$ @  Urine Studies No results for input(s): UHGB, CRYS in the last 72 hours.  Invalid input(s): UACOL, UAPR, USPG, UPH, UTP, UGL, UKET, UBIL, UNIT, UROB, ULEU, UEPI, UWBC, URBC, UBAC, CAST, UCOM, BILUA  Basic Metabolic Panel:  Recent Labs Lab 03/08/15 0914 03/09/15 0455  NA 136 144  K 3.6 3.0*  CL 102 107  CO2 26 26  GLUCOSE 129* 107*  BUN 13 13  CREATININE 0.54 0.44*  CALCIUM 9.4 9.1   GFR Estimated Creatinine Clearance: 59.7 mL/min (by C-G formula based on Cr of 0.44). Liver Function Tests:  Recent Labs Lab 03/08/15 0914 03/09/15 0455  AST 24 24  ALT 12 12  ALKPHOS 272* 249*  BILITOT 0.7 0.9  PROT 7.2 7.0  ALBUMIN 4.0 3.9    Recent Labs Lab 03/08/15 0914  LIPASE 18   No results for input(s): AMMONIA in the last 168 hours. Coagulation profile  Recent Labs Lab 03/04/15 1100  INR 1.15     CBC:  Recent Labs Lab 03/04/15 1028 03/08/15 0914 03/09/15 0455  WBC 7.1 7.0 8.4  NEUTROABS  --  5.2  --   HGB 12.8 12.9 12.1  HCT 39.6 40.0 37.6  MCV 91.7 89.9 90.4  PLT 182 184 194   Cardiac Enzymes: No results for input(s): CKTOTAL, CKMB, CKMBINDEX, TROPONINI in the last 168 hours. BNP: Invalid input(s): POCBNP CBG:  Recent Labs Lab 03/09/15 0013 03/09/15 0424 03/09/15 0740 03/09/15 1208 03/09/15 1612  GLUCAP 117* 107* 139* 99 126*   D-Dimer No results for input(s): DDIMER in the last 72 hours. Hgb A1c  Recent Labs  03/08/15 0914  HGBA1C 6.1*   Lipid Profile No results for input(s): CHOL, HDL, LDLCALC, TRIG, CHOLHDL, LDLDIRECT in the last 72 hours. Thyroid function studies No results for input(s): TSH, T4TOTAL, T3FREE, THYROIDAB in the last 72 hours.  Invalid input(s): FREET3 Anemia work up No results for input(s): VITAMINB12, FOLATE, FERRITIN, TIBC, IRON, RETICCTPCT in the last 72 hours. Microbiology No results found for this or any previous visit (from the past 240 hour(s)).    Studies:  Ir Fluoro Guide Cv Line Right  03/09/2015   CLINICAL DATA:  METASTATIC LUNG CANCER  EXAM: RIGHT INTERNAL JUGULAR SINGLE LUMEN POWER PORT CATHETER INSERTION  Date:  4/19/20164/19/2016 3:51 pm  Radiologist:  M. Daryll Brod, MD  Guidance:  Ultrasound fluoroscopic  FLUOROSCOPY TIME:  30 seconds, 3 mGy  MEDICATIONS AND MEDICAL HISTORY: [name] already receiving vancomycin earlier today. This was reviewed with pharmacy. No further antibiotics were necessary.  2 mg Versed  ANESTHESIA/SEDATION: 28 minutes  CONTRAST:  None.  COMPLICATIONS: None immediate  PROCEDURE: Informed consent was obtained from the patient following explanation of the procedure, risks, benefits and alternatives. The patient understands, agrees and consents for the procedure. All questions were addressed. A time out was performed.  Maximal barrier sterile technique utilized including caps, mask, sterile  gowns, sterile gloves, large sterile drape, hand hygiene, and 2% chlorhexidine scrub.  Under sterile conditions and local anesthesia, right internal jugular micropuncture venous access was performed. Access was performed with ultrasound. Images were obtained for documentation. A guide wire was inserted followed by a transitional dilator. This allowed insertion of a guide wire and catheter into the IVC. Measurements were obtained from the SVC / RA junction back to the right IJ venotomy site. In the right infraclavicular chest, a subcutaneous pocket was created over the second anterior rib. This was done under sterile conditions and local anesthesia. 1% lidocaine with epinephrine was utilized for this. A 2.5 cm incision was made in the skin. Blunt dissection was performed to create a subcutaneous pocket over the right pectoralis major muscle. The pocket was flushed with saline vigorously. There was adequate hemostasis. The port catheter was assembled and checked for leakage. The port catheter was secured in the pocket with two retention sutures. The tubing was tunneled subcutaneously to the right venotomy site and inserted into the SVC/RA junction through a valved peel-away sheath. Position was confirmed with fluoroscopy. Images were obtained for documentation. The patient tolerated the procedure well. No immediate complications. Incisions were closed in a two layer fashion with 4 - 0 Vicryl suture. Dermabond was applied to the skin. The port catheter was accessed, blood was aspirated followed by saline and heparin flushes. Needle was removed. A dry sterile dressing was applied.  IMPRESSION: Ultrasound and fluoroscopically guided right internal jugular single lumen power port catheter insertion. Tip in the SVC/RA junction. Catheter ready for use.   Electronically Signed   By: Jerilynn Mages.  Shick M.D.   On: 03/09/2015 16:23   Ir US Guide Vasc Access Right  03/09/2015   CLINICAL DATA:  METASTATIC LUNG CANCER  EXAM: RIGHT  INTERNAL JUGULAR SINGLE LUMEN POWER PORT CATHETER INSERTION  Date:  4/19/20164/19/2016 3:51 pm  Radiologist:  M. Daryll Brod, MD  Guidance:  Ultrasound fluoroscopic  FLUOROSCOPY TIME:  30 seconds, 3 mGy  MEDICATIONS AND MEDICAL HISTORY: [name] already receiving vancomycin earlier today. This was reviewed with pharmacy. No further antibiotics were necessary.  2 mg Versed  ANESTHESIA/SEDATION: 28 minutes  CONTRAST:  None.  COMPLICATIONS: None immediate  PROCEDURE: Informed consent was obtained from the patient following explanation of the procedure, risks, benefits and alternatives. The patient understands, agrees and consents for the procedure. All questions were addressed. A time out was performed.  Maximal barrier sterile technique utilized including caps, mask, sterile gowns, sterile gloves, large sterile drape, hand hygiene, and 2% chlorhexidine scrub.  Under sterile conditions and local anesthesia, right internal jugular micropuncture venous access was performed. Access was performed with ultrasound. Images were obtained for documentation. A guide wire was inserted followed by a transitional dilator. This allowed insertion of a guide  wire and catheter into the IVC. Measurements were obtained from the SVC / RA junction back to the right IJ venotomy site. In the right infraclavicular chest, a subcutaneous pocket was created over the second anterior rib. This was done under sterile conditions and local anesthesia. 1% lidocaine with epinephrine was utilized for this. A 2.5 cm incision was made in the skin. Blunt dissection was performed to create a subcutaneous pocket over the right pectoralis major muscle. The pocket was flushed with saline vigorously. There was adequate hemostasis. The port catheter was assembled and checked for leakage. The port catheter was secured in the pocket with two retention sutures. The tubing was tunneled subcutaneously to the right venotomy site and inserted into the SVC/RA  junction through a valved peel-away sheath. Position was confirmed with fluoroscopy. Images were obtained for documentation. The patient tolerated the procedure well. No immediate complications. Incisions were closed in a two layer fashion with 4 - 0 Vicryl suture. Dermabond was applied to the skin. The port catheter was accessed, blood was aspirated followed by saline and heparin flushes. Needle was removed. A dry sterile dressing was applied.  IMPRESSION: Ultrasound and fluoroscopically guided right internal jugular single lumen power port catheter insertion. Tip in the SVC/RA junction. Catheter ready for use.   Electronically Signed   By: Jerilynn Mages.  Shick M.D.   On: 03/09/2015 16:23    Assessment: 64 y.o.  female, with significant history of smoking and stage I right lung adenocarcinoma status post lobectomy in 2014, no presented with was any back pain and diffuse bone lesions on the lumbar spine MRI.  1. 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 Nausea and vomiting 3. Back pain from bone metastasis 4. Bipolar   Plan:  -appreciate inpt team care -I have spoken with radiation oncologist Dr. Sondra Come who will see the patient later today, to discuss palliative radiation to her spinal bone metastasis -Appreciated palliative consult, agreed with the pain management -Her daughter is still upset about the conversation I had with agent and her yesterday regarding her poor prognosis. I had a long conversation with her again -If her symptoms and performance statussignificant improvement, I may consider chemotherapy. However, she does not want to live with her daughter, but she needs a caregiver to monitor and support her at home. We also discussed option of rehabilitation after hospital discharge. She states she will think about it  I'll follow as needed. Please call me if you have any questions.  I'll plan to see her back on week after her hospital discharge, I'll set up her  appointments.   Truitt Merle, MD 03/09/2015  6:05 PM

## 2015-03-09 NOTE — Progress Notes (Signed)
Patient ID: [name] [name], female   DOB: 08-22-1951, 64 y.o.   MRN: [id number]    Referring Physician(s): Feng,Yan  Subjective:  Pt still with some intermittent nausea, back pain; request now received for port a cath placement;currently has peripheral IV in rt shoulder;  see IR note from 03/04/15 for additional details of PMH.   Allergies: Haldol; Acetaminophen; Ibuprofen; Metronidazole; Morphine and related; and Penicillins  Medications: Prior to Admission medications   Medication Sig Start Date End Date Taking? Authorizing Provider  ALPRAZolam Duanne Moron) 1 MG tablet Take 0.5 mg by mouth 2 (two) times daily.    Yes Historical Provider, MD  Cyanocobalamin (VITAMIN B-12 IJ) Inject as directed every 30 (thirty) days. Administered by Dr. York Ram around the 1st of each month   Yes Historical Provider, MD  dicyclomine (BENTYL) 20 MG tablet Take 20 mg by mouth 3 (three) times daily with meals.   Yes Historical Provider, MD  gabapentin (NEURONTIN) 400 MG capsule Take 400 mg by mouth 3 (three) times daily. Nerve pain   Yes Historical Provider, MD  HYDROcodone-acetaminophen (NORCO) 10-325 MG per tablet Take 1 tablet by mouth every 6 (six) hours as needed. for pain 02/26/15  Yes Historical Provider, MD  hydrOXYzine (VISTARIL) 25 MG capsule Take 25 mg by mouth daily.   Yes Historical Provider, MD  Ipratropium-Albuterol (COMBIVENT RESPIMAT) 20-100 MCG/ACT AERS respimat Inhale 2 puffs into the lungs daily as needed for wheezing or shortness of breath.    Yes Historical Provider, MD  KLOR-CON M20 20 MEQ tablet Take 20 mEq by mouth every morning. with food 01/04/15  Yes Historical Provider, MD  loratadine (CLARITIN) 10 MG tablet Take 10 mg by mouth every morning.    Yes Historical Provider, MD  Menthol-Methyl Salicylate (MUSCLE RUB) 10-15 % CREA Apply 1 application topically daily as needed for muscle pain. Left shoulder pain   Yes Historical Provider, MD  metFORMIN (GLUCOPHAGE) 500 MG tablet Take 500  mg by mouth 2 (two) times daily with a meal.   Yes Historical Provider, MD  metoprolol tartrate (LOPRESSOR) 25 MG tablet Take 25 mg by mouth 2 (two) times daily.  11/24/13  Yes Historical Provider, MD  olmesartan-hydrochlorothiazide (BENICAR HCT) 40-25 MG per tablet Take 1 tablet by mouth every morning.    Yes Historical Provider, MD  omeprazole (PRILOSEC) 20 MG capsule Take 20 mg by mouth 2 (two) times daily before a meal.  01/16/14  Yes Historical Provider, MD  ondansetron (ZOFRAN-ODT) 8 MG disintegrating tablet Take 1 tablet by mouth every 6 (six) hours as needed for nausea or vomiting.  03/02/15  Yes Historical Provider, MD  OXcarbazepine (TRILEPTAL) 150 MG tablet Take 150 mg by mouth 3 (three) times daily.  11/24/13  Yes Historical Provider, MD  polyethylene glycol (MIRALAX / GLYCOLAX) packet Take 17 g by mouth 2 (two) times daily as needed for mild constipation or moderate constipation.   Yes Historical Provider, MD  promethazine (PHENERGAN) 25 MG tablet Take 1 tablet (25 mg total) by mouth every 8 (eight) hours as needed for nausea or vomiting. 12/05/14  Yes Christopher Lawyer, PA-C  senna-docusate (SENOKOT-S) 8.6-50 MG per tablet Take 1 tablet by mouth at bedtime. Patient taking differently: Take 1 tablet by mouth every morning.  03/08/15  Yes Carmin Muskrat, MD  simvastatin (ZOCOR) 20 MG tablet Take 20 mg by mouth at bedtime.    Yes Historical Provider, MD  tiZANidine (ZANAFLEX) 4 MG tablet Take 4 mg by mouth every 8 (eight) hours as needed for  muscle spasms.   Yes Historical Provider, MD  triamcinolone cream (KENALOG) 0.1 % Apply 1 application topically 2 (two) times daily as needed (for rash).  11/30/14  Yes Historical Provider, MD  Vitamin D, Ergocalciferol, (DRISDOL) 50000 UNITS CAPS capsule Take 50,000 Units by mouth every Monday.    Yes Historical Provider, MD     Vital Signs: BP 161/71 mmHg  Pulse 76  Temp(Src) 98.8 F (37.1 C) (Oral)  Resp 16  Ht '5\' 4"'$  (1.626 m)  SpO2 96%  Physical  Exam pt awake; depressed affect; chest- CTA bilat; heart- RRR with murmur; abd- soft,+BS, mildly tender; ext- FROM, no edema  Imaging: No results found.  Labs:  CBC:  Recent Labs  02/18/15 0430 03/04/15 1028 03/08/15 0914 03/09/15 0455  WBC 7.9 7.1 7.0 8.4  HGB 13.0 12.8 12.9 12.1  HCT 39.6 39.6 40.0 37.6  PLT 206 182 184 194    COAGS:  Recent Labs  02/11/15 1610 03/04/15 1100  INR 1.15 1.15  APTT 31 30    BMP:  Recent Labs  02/17/15 0749 02/18/15 0430 03/08/15 0914 03/09/15 0455  NA 138 142 136 144  K 3.0* 3.7 3.6 3.0*  CL 97 107 102 107  CO2 '27 26 26 26  '$ GLUCOSE 144* 87 129* 107*  BUN '14 12 13 13  '$ CALCIUM 9.7 8.8 9.4 9.1  CREATININE 0.65 0.57 0.54 0.44*  GFRNONAA >90 >90 >90 >90  GFRAA >90 >90 >90 >90    LIVER FUNCTION TESTS:  Recent Labs  12/29/14 1201 02/17/15 0749 03/08/15 0914 03/09/15 0455  BILITOT 0.8 0.8 0.7 0.9  AST '20 26 24 24  '$ ALT '13 14 12 12  '$ ALKPHOS 104 198* 272* 249*  PROT 6.7 8.0 7.2 7.0  ALBUMIN 3.9 4.5 4.0 3.9    Assessment and Plan:  Pt with progressive right lung adenocarcinoma , FTT, back pain secondary to bony mets, poor IV access; request now received for port a cath placement; Risks and benefits discussed with the patient including, but not limited to bleeding, infection, pneumothorax, or fibrin sheath development and need for additional procedures. All of the patient's questions were answered, patient is agreeable to proceed. Consent signed and in chart.    Signed: Autumn Messing 03/09/2015, 10:06 AM   I spent a total of 15 minutes in face to face in clinical consultation/evaluation, greater than 50% of which was counseling/coordinating care for port a cath placement.

## 2015-03-10 ENCOUNTER — Telehealth: Payer: Self-pay | Admitting: Hematology

## 2015-03-10 ENCOUNTER — Encounter (HOSPITAL_COMMUNITY): Admission: RE | Admit: 2015-03-10 | Payer: Medicare HMO | Source: Ambulatory Visit

## 2015-03-10 DIAGNOSIS — F32A Depression, unspecified: Secondary | ICD-10-CM | POA: Insufficient documentation

## 2015-03-10 DIAGNOSIS — F329 Major depressive disorder, single episode, unspecified: Secondary | ICD-10-CM | POA: Insufficient documentation

## 2015-03-10 LAB — CBC
HCT: 36.6 % (ref 36.0–46.0)
Hemoglobin: 11.8 g/dL — ABNORMAL LOW (ref 12.0–15.0)
MCH: 28.9 pg (ref 26.0–34.0)
MCHC: 32.2 g/dL (ref 30.0–36.0)
MCV: 89.7 fL (ref 78.0–100.0)
PLATELETS: 200 10*3/uL (ref 150–400)
RBC: 4.08 MIL/uL (ref 3.87–5.11)
RDW: 13.8 % (ref 11.5–15.5)
WBC: 10 10*3/uL (ref 4.0–10.5)

## 2015-03-10 LAB — BASIC METABOLIC PANEL
Anion gap: 7 (ref 5–15)
BUN: 9 mg/dL (ref 6–23)
CALCIUM: 9.3 mg/dL (ref 8.4–10.5)
CO2: 27 mmol/L (ref 19–32)
Chloride: 106 mmol/L (ref 96–112)
Creatinine, Ser: 0.47 mg/dL — ABNORMAL LOW (ref 0.50–1.10)
Glucose, Bld: 94 mg/dL (ref 70–99)
Potassium: 3.6 mmol/L (ref 3.5–5.1)
SODIUM: 140 mmol/L (ref 135–145)

## 2015-03-10 LAB — GLUCOSE, CAPILLARY
GLUCOSE-CAPILLARY: 84 mg/dL (ref 70–99)
GLUCOSE-CAPILLARY: 96 mg/dL (ref 70–99)
GLUCOSE-CAPILLARY: 135 mg/dL — AB (ref 70–99)
Glucose-Capillary: 125 mg/dL — ABNORMAL HIGH (ref 70–99)
Glucose-Capillary: 126 mg/dL — ABNORMAL HIGH (ref 70–99)
Glucose-Capillary: 83 mg/dL (ref 70–99)

## 2015-03-10 MED ORDER — OXYCODONE HCL ER 40 MG PO T12A
40.0000 mg | EXTENDED_RELEASE_TABLET | Freq: Two times a day (BID) | ORAL | Status: DC
  Administered 2015-03-10 – 2015-03-11 (×3): 40 mg via ORAL
  Filled 2015-03-10 (×3): qty 1

## 2015-03-10 MED ORDER — ALPRAZOLAM 0.5 MG PO TABS
0.5000 mg | ORAL_TABLET | Freq: Three times a day (TID) | ORAL | Status: DC
  Administered 2015-03-10 – 2015-03-12 (×6): 0.5 mg via ORAL
  Filled 2015-03-10 (×6): qty 1

## 2015-03-10 MED ORDER — POLYETHYLENE GLYCOL 3350 17 G PO PACK
17.0000 g | PACK | Freq: Every day | ORAL | Status: DC
  Administered 2015-03-10 – 2015-03-11 (×2): 17 g via ORAL
  Filled 2015-03-10 (×4): qty 1

## 2015-03-10 MED ORDER — HYDROMORPHONE HCL 1 MG/ML IJ SOLN
0.5000 mg | INTRAMUSCULAR | Status: DC | PRN
  Administered 2015-03-10 – 2015-03-12 (×13): 0.5 mg via INTRAVENOUS
  Filled 2015-03-10 (×15): qty 1

## 2015-03-10 MED ORDER — CITALOPRAM HYDROBROMIDE 20 MG PO TABS
20.0000 mg | ORAL_TABLET | Freq: Every day | ORAL | Status: DC
  Administered 2015-03-10 – 2015-03-12 (×3): 20 mg via ORAL
  Filled 2015-03-10 (×3): qty 1

## 2015-03-10 NOTE — Progress Notes (Signed)
Patient GQ:[id number] Raftery      DOB: 20-Aug-1951      OIZ:[id number]   Palliative Medicine Team at [facility] Progress Note    Subjective: Upset that pain medicine changes occurred with reduction of IV dilaudid dose. Reports pain no better today despite receiving more medications.  Feeling very depressed/anxious.  Nausea doing better and tolerating clears  Filed Vitals:   03/10/15 0519  BP: 158/85  Pulse: 82  Temp: 97.9 F (36.6 C)  Resp: 16   Physical exam: GEN: alert, appears more comfortable today until talking about issues she has with care CV: Regular rate ABD: soft Psych: easily upset when talking about her care  CBC    Component Value Date/Time   WBC 10.0 03/10/2015 0522   WBC 7.5 02/11/2015 1610   RBC 4.08 03/10/2015 0522   RBC 4.38 02/11/2015 1610   HGB 11.8* 03/10/2015 0522   HGB 12.6 02/11/2015 1610   HCT 36.6 03/10/2015 0522   HCT 39.1 02/11/2015 1610   PLT 200 03/10/2015 0522   PLT 246 02/11/2015 1610   MCV 89.7 03/10/2015 0522   MCV 89.2 02/11/2015 1610   MCH 28.9 03/10/2015 0522   MCH 28.7 02/11/2015 1610   MCHC 32.2 03/10/2015 0522   MCHC 32.2 02/11/2015 1610   RDW 13.8 03/10/2015 0522   RDW 13.5 02/11/2015 1610   LYMPHSABS 1.3 03/08/2015 0914   LYMPHSABS 2.1 02/11/2015 1610   MONOABS 0.5 03/08/2015 0914   MONOABS 0.5 02/11/2015 1610   EOSABS 0.0 03/08/2015 0914   EOSABS 0.1 02/11/2015 1610   BASOSABS 0.0 03/08/2015 0914   BASOSABS 0.1 02/11/2015 1610    CMP     Component Value Date/Time   NA 140 03/10/2015 0522   K 3.6 03/10/2015 0522   CL 106 03/10/2015 0522   CO2 27 03/10/2015 0522   GLUCOSE 94 03/10/2015 0522   BUN 9 03/10/2015 0522   CREATININE 0.47* 03/10/2015 0522   CALCIUM 9.3 03/10/2015 0522   PROT 7.0 03/09/2015 0455   ALBUMIN 3.9 03/09/2015 0455   AST 24 03/09/2015 0455   ALT 12 03/09/2015 0455   ALKPHOS 249* 03/09/2015 0455   BILITOT 0.9 03/09/2015 0455   GFRNONAA >90 03/10/2015 0522   GFRAA >90 03/10/2015 0522       Assessment and plan: 64 yo female with Dm, Chronic back/abd pain, Bipolar, Hep C, stage I NSCLC with recent diffuse osseous metastatic disease presumed 2/2 primary lung CA recurrence. Admitted with uncontrolled pain, N/V.   1. Code Status: listed as full, I did not discuss  2. GOC: Dorlene able to talk with me more today.  She divulges how difficult this new diagnosis has been for her. She feels anxious/depressed/angry/scared. What she is currently feeling she is being told is that chemo will be started as she can not tolerate radiation. I am not sure this is truly plan, but this is Avalyn's understanding of the situation.  I think Charlottes mood has really made it difficult for her to process information and have these ultimate difficult conversations with. She admits this herself.  I also think she is really struggling with loss of control.  Our palliative care team will continue to follow along while she remains inpatient here.   3. Symptom Management:  1. 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  She is still using a lot of pain medicine and has some acknowledgement today that she treats some of  her mood with opioids as well.  That being said she is still using both a lot of oral and PRN pain meds.  She is frustrated I cut back her IV pain medication and struggles with idea of using oral pain meds to control her pain while in hospital. We had lengthy discussion around reasoning for this. We compromised today that we would increase her oxycontin to '40mg'$  BID and keep current oxycodone and dilaudid dosing same and work on treating her depression with SSRI. She is agreeable with this plan and I think her buy in to treatment plans will be key going forward.  2. Nausea/Vomiting: improving, continue regimen 3. Constipation: On bowel regimen. Last BM 2 days ago  4. Anxiety/Bipolar: Xanax increased to  TID. I will add celexa.    4. Psychosocial/Spiritual: She had 7 children in total. 2 have died.  All of her children live in [place] except for her daughter [name] who lives here.  She admits to strain with all her children, even Andoria.  She feels close with Andoria's 3 small children who are ages 28,5, and 68month.  She has a strong spiritual faith and her reverend was able to see her yesterday.     Total Time: 60 minutes >50% of time spent in counseling and coordination of care regarding above  ADoran ClayD.O. [facility] Pager: [phone] Phone: [phone]

## 2015-03-10 NOTE — Telephone Encounter (Signed)
Faxed pt path reports to [facility] [phone]

## 2015-03-10 NOTE — Progress Notes (Signed)
[facility] PROGRESS NOTE  [name] [name] BWG:[id number] DOB: 11/29/1950 DOA: 03/08/2015 PCP: Harvie Junior, MD  Assessment/Plan:  1. Metastatic adenocarcinoma of lung. -Patient having a history of well-differentiated adenocarcinoma of lung undergoing lobectomy in 2014. Found to have widespread metastatic disease. She had recent biopsy of suspicious lesion involving left iliac crest revealed adenocarcinoma having similar morphologic features to previous right lung adenocarcinoma. Patient was evaluated by medical oncology, may not be good candidate for systemic chemotherapy. Patient underwent Port-A-Cath placement on 03/09/2015. Patient was seen by radiation oncology for palliative radiation therapy  2. Nausea/vomiting. -Symptoms improving, could be secondary to underlying malignancy. She underwent workup on a recent hospitalization undergoing esophagram on 02/18/2015 which did not show structural abnormality. Continue with Reglan 5 mg 3 times a day  3. Abdominal pain. -Patient continues to complain of abdominal pain suspect related to underlying malignancy. On recent hospitalization for similar symptoms CT scan of abdomen and pelvis with IV contrast showed normal liver, gallbladder, without evidence of pancreatic mass. It was negative for bowel obstruction.  -Palliative medicine consulted for assistance with pain management. Will await further recommendations by palliative care  4. Diabetes mellitus. -Her metformin was held on admission given nothing by mouth status. Continue Accu-Cheks every 4 hours with sliding scale coverage  5. Hypertension. -Systolic blood pressures remained in the 140s to 160s, metoprolol was increased to 50 mg by mouth twice a day  6. Cancer related pain. -Palliative care was consulted, she was started on oxycodone 30 mg every 12 hours, Decadron 4 mg by mouth daily. Continue oxycodone 5-10 mg every 3 years as needed for severe breakthrough  pain  7. Hypokalemia Resolved, today potassium is 3.6  Code Status: Full code Family Communication: No family present at bedside Disposition Plan: Home when stable   Consultants:  Palliative care  Oncology  Procedures:  None  Antibiotics:  None  HPI/Subjective: 64 year old female with a past medical history of adenocarcinoma of right lung status post lobectomy having widespread bony metastatic disease, who was admitted to the medicine service on 03/08/2015 when she presented with complaints of abdominal pain associated with nausea and vomiting. She had a similar presentation on 02/17/2015 with symptoms resolving after bowel rest, analgesia and as needed antiemetic therapy. Her oncologist Dr.Feng evaluated patient during this hospitalization, felt that nausea and vomiting could be related to underlying malignancy vs SBO. Patient's symptoms improved by the following day with resolution to nausea and vomiting. Pain symptoms also improving. Dr Deitra Mayo of Palliative Medicine was consulted for assistance in management of symptoms. She was started on decadron along with oxycontin 30 mg PO BID which seemed to help out her pain. Her diet was advanced to clears. During this hospitalization interventional radiology placed power port, which was ordered by her oncologist.  Patient today unhappy that her pain medication was changed by palliative care, and pain is not controlled.  Objective: Filed Vitals:   03/10/15 1408  BP: 168/84  Pulse: 74  Temp: 98.2 F (36.8 C)  Resp: 18    Intake/Output Summary (Last 24 hours) at 03/10/15 1700 Last data filed at 03/10/15 1300  Gross per 24 hour  Intake    550 ml  Output   2400 ml  Net  -1850 ml   There were no vitals filed for this visit.  Exam:   General:  Appears in no acute distress  Cardiovascular: S1-S2 is regular  Respiratory: Clear to auscultation bilaterally  Abdomen: Soft, nontender no organomegaly  Musculoskeletal: No  edema of lower  extremities noted   Data Reviewed: Basic Metabolic Panel:  Recent Labs Lab 03/08/15 0914 03/09/15 0455 03/10/15 0522  NA 136 144 140  K 3.6 3.0* 3.6  CL 102 107 106  CO2 '26 26 27  '$ GLUCOSE 129* 107* 94  BUN '13 13 9  '$ CREATININE 0.54 0.44* 0.47*  CALCIUM 9.4 9.1 9.3   Liver Function Tests:  Recent Labs Lab 03/08/15 0914 03/09/15 0455  AST 24 24  ALT 12 12  ALKPHOS 272* 249*  BILITOT 0.7 0.9  PROT 7.2 7.0  ALBUMIN 4.0 3.9    Recent Labs Lab 03/08/15 0914  LIPASE 18   No results for input(s): AMMONIA in the last 168 hours. CBC:  Recent Labs Lab 03/04/15 1028 03/08/15 0914 03/09/15 0455 03/10/15 0522  WBC 7.1 7.0 8.4 10.0  NEUTROABS  --  5.2  --   --   HGB 12.8 12.9 12.1 11.8*  HCT 39.6 40.0 37.6 36.6  MCV 91.7 89.9 90.4 89.7  PLT 182 184 194 200   Cardiac Enzymes: No results for input(s): CKTOTAL, CKMB, CKMBINDEX, TROPONINI in the last 168 hours. BNP (last 3 results) No results for input(s): BNP in the last 8760 hours.  ProBNP (last 3 results) No results for input(s): PROBNP in the last 8760 hours.  CBG:  Recent Labs Lab 03/10/15 03/10/15 0513 03/10/15 0744 03/10/15 1147 03/10/15 1655  GLUCAP 125* 84 83 96 126*    No results found for this or any previous visit (from the past 240 hour(s)).   Studies: Ir Fluoro Guide Cv Line Right  03/09/2015   CLINICAL DATA:  METASTATIC LUNG CANCER  EXAM: RIGHT INTERNAL JUGULAR SINGLE LUMEN POWER PORT CATHETER INSERTION  Date:  4/19/20164/19/2016 3:51 pm  Radiologist:  M. Daryll Brod, MD  Guidance:  Ultrasound fluoroscopic  FLUOROSCOPY TIME:  30 seconds, 3 mGy  MEDICATIONS AND MEDICAL HISTORY: [name] already receiving vancomycin earlier today. This was reviewed with pharmacy. No further antibiotics were necessary.  2 mg Versed  ANESTHESIA/SEDATION: 28 minutes  CONTRAST:  None.  COMPLICATIONS: None immediate  PROCEDURE: Informed consent was obtained from the patient following explanation of  the procedure, risks, benefits and alternatives. The patient understands, agrees and consents for the procedure. All questions were addressed. A time out was performed.  Maximal barrier sterile technique utilized including caps, mask, sterile gowns, sterile gloves, large sterile drape, hand hygiene, and 2% chlorhexidine scrub.  Under sterile conditions and local anesthesia, right internal jugular micropuncture venous access was performed. Access was performed with ultrasound. Images were obtained for documentation. A guide wire was inserted followed by a transitional dilator. This allowed insertion of a guide wire and catheter into the IVC. Measurements were obtained from the SVC / RA junction back to the right IJ venotomy site. In the right infraclavicular chest, a subcutaneous pocket was created over the second anterior rib. This was done under sterile conditions and local anesthesia. 1% lidocaine with epinephrine was utilized for this. A 2.5 cm incision was made in the skin. Blunt dissection was performed to create a subcutaneous pocket over the right pectoralis major muscle. The pocket was flushed with saline vigorously. There was adequate hemostasis. The port catheter was assembled and checked for leakage. The port catheter was secured in the pocket with two retention sutures. The tubing was tunneled subcutaneously to the right venotomy site and inserted into the SVC/RA junction through a valved peel-away sheath. Position was confirmed with fluoroscopy. Images were obtained for documentation. The patient tolerated the procedure well.  No immediate complications. Incisions were closed in a two layer fashion with 4 - 0 Vicryl suture. Dermabond was applied to the skin. The port catheter was accessed, blood was aspirated followed by saline and heparin flushes. Needle was removed. A dry sterile dressing was applied.  IMPRESSION: Ultrasound and fluoroscopically guided right internal jugular single lumen power port  catheter insertion. Tip in the SVC/RA junction. Catheter ready for use.   Electronically Signed   By: Jerilynn Mages.  Shick M.D.   On: 03/09/2015 16:23   Ir US Guide Vasc Access Right  03/09/2015   CLINICAL DATA:  METASTATIC LUNG CANCER  EXAM: RIGHT INTERNAL JUGULAR SINGLE LUMEN POWER PORT CATHETER INSERTION  Date:  4/19/20164/19/2016 3:51 pm  Radiologist:  M. Daryll Brod, MD  Guidance:  Ultrasound fluoroscopic  FLUOROSCOPY TIME:  30 seconds, 3 mGy  MEDICATIONS AND MEDICAL HISTORY: [name] already receiving vancomycin earlier today. This was reviewed with pharmacy. No further antibiotics were necessary.  2 mg Versed  ANESTHESIA/SEDATION: 28 minutes  CONTRAST:  None.  COMPLICATIONS: None immediate  PROCEDURE: Informed consent was obtained from the patient following explanation of the procedure, risks, benefits and alternatives. The patient understands, agrees and consents for the procedure. All questions were addressed. A time out was performed.  Maximal barrier sterile technique utilized including caps, mask, sterile gowns, sterile gloves, large sterile drape, hand hygiene, and 2% chlorhexidine scrub.  Under sterile conditions and local anesthesia, right internal jugular micropuncture venous access was performed. Access was performed with ultrasound. Images were obtained for documentation. A guide wire was inserted followed by a transitional dilator. This allowed insertion of a guide wire and catheter into the IVC. Measurements were obtained from the SVC / RA junction back to the right IJ venotomy site. In the right infraclavicular chest, a subcutaneous pocket was created over the second anterior rib. This was done under sterile conditions and local anesthesia. 1% lidocaine with epinephrine was utilized for this. A 2.5 cm incision was made in the skin. Blunt dissection was performed to create a subcutaneous pocket over the right pectoralis major muscle. The pocket was flushed with saline vigorously. There was adequate  hemostasis. The port catheter was assembled and checked for leakage. The port catheter was secured in the pocket with two retention sutures. The tubing was tunneled subcutaneously to the right venotomy site and inserted into the SVC/RA junction through a valved peel-away sheath. Position was confirmed with fluoroscopy. Images were obtained for documentation. The patient tolerated the procedure well. No immediate complications. Incisions were closed in a two layer fashion with 4 - 0 Vicryl suture. Dermabond was applied to the skin. The port catheter was accessed, blood was aspirated followed by saline and heparin flushes. Needle was removed. A dry sterile dressing was applied.  IMPRESSION: Ultrasound and fluoroscopically guided right internal jugular single lumen power port catheter insertion. Tip in the SVC/RA junction. Catheter ready for use.   Electronically Signed   By: Jerilynn Mages.  Shick M.D.   On: 03/09/2015 16:23    Scheduled Meds: . ALPRAZolam  0.5 mg Oral TID  . citalopram  20 mg Oral Daily  . dexamethasone  4 mg Oral Daily  . gabapentin  400 mg Oral TID  . heparin  5,000 Units Subcutaneous 3 times per day  . insulin aspart  0-15 Units Subcutaneous 6 times per day  . metoCLOPramide (REGLAN) injection  5 mg Intravenous 3 times per day  . metoprolol tartrate  50 mg Oral BID  . OXcarbazepine  150 mg  Oral TID  . OxyCODONE  40 mg Oral Q12H  . polyethylene glycol  17 g Oral Daily  . senna-docusate  2 tablet Oral BID   Continuous Infusions: . sodium chloride 100 mL/hr at 03/10/15 1019    Principal Problem:   Metastatic adenocarcinoma Active Problems:   Lung cancer, right upper lobe   Hypertension   Metastatic primary lung cancer   Nausea with vomiting   Cancer associated pain   CN (constipation)   Lung cancer   Metastatic cancer to bone   Depression    Time spent: 25 minutes    [facility] Pager [phone]. If 7PM-7AM, please contact night-coverage at  [email or web address], password [facility] 03/10/2015, 5:00 PM  LOS: 2 days

## 2015-03-10 NOTE — Care Management Note (Signed)
CARE MANAGEMENT NOTE 03/10/2015  Patient:  [name] [name], [name] [name]   Account Number:  [id number]  Date Initiated:  03/10/2015  Documentation initiated by:  Marney Doctor  Subjective/Objective Assessment:   64 yo admitted with Metastatic Adenocarcinoma     Action/Plan:   Pt from home alone   Anticipated DC Date:  03/13/2015   Anticipated DC Plan:  [place]  CM consult      Choice offered to / List presented to:             Status of service:  In process, will continue to follow Medicare Important Message given?   (If response is "NO", the following Medicare IM given date fields will be blank) Date Medicare IM given:   Medicare IM given by:   Date Additional Medicare IM given:   Additional Medicare IM given by:    Discharge Disposition:    Per UR Regulation:  Reviewed for med. necessity/level of care/duration of stay  If discussed at [place] of Stay Meetings, dates discussed:    Comments:  03/10/15 Marney Doctor RN,BSN,NCM [phone] Chart reviewed. Unsure of disposition plan at this time. Palliative Medicine following. PT eval ordered to assist with disposition planning. Will await PT recommendations and assist with DC planning as needed.

## 2015-03-11 ENCOUNTER — Ambulatory Visit: Admission: RE | Admit: 2015-03-11 | Payer: Medicare HMO | Source: Ambulatory Visit

## 2015-03-11 ENCOUNTER — Other Ambulatory Visit: Payer: Self-pay

## 2015-03-11 ENCOUNTER — Ambulatory Visit: Payer: Self-pay | Admitting: Hematology

## 2015-03-11 ENCOUNTER — Ambulatory Visit: Payer: Medicare HMO | Admitting: Radiation Oncology

## 2015-03-11 DIAGNOSIS — Z515 Encounter for palliative care: Secondary | ICD-10-CM

## 2015-03-11 DIAGNOSIS — F411 Generalized anxiety disorder: Secondary | ICD-10-CM

## 2015-03-11 LAB — GLUCOSE, CAPILLARY
GLUCOSE-CAPILLARY: 88 mg/dL (ref 70–99)
GLUCOSE-CAPILLARY: 104 mg/dL — AB (ref 70–99)
Glucose-Capillary: 104 mg/dL — ABNORMAL HIGH (ref 70–99)
Glucose-Capillary: 100 mg/dL — ABNORMAL HIGH (ref 70–99)
Glucose-Capillary: 134 mg/dL — ABNORMAL HIGH (ref 70–99)
Glucose-Capillary: 113 mg/dL — ABNORMAL HIGH (ref 70–99)

## 2015-03-11 MED ORDER — WHITE PETROLATUM GEL
Status: DC | PRN
  Administered 2015-03-11: 1 via TOPICAL
  Filled 2015-03-11 (×3): qty 5

## 2015-03-11 MED ORDER — OXYCODONE HCL 5 MG PO TABS
10.0000 mg | ORAL_TABLET | ORAL | Status: DC | PRN
  Administered 2015-03-11 – 2015-03-12 (×3): 10 mg via ORAL
  Filled 2015-03-11 (×3): qty 2

## 2015-03-11 NOTE — Progress Notes (Signed)
[name] [name]   DOB:[birth year]   BT#:[id number]   XTG#:[id number]  Subjective: Pt is tolerating liquid diet well. She complains about left buttock pain next to the sacrum. No significant pain in thoracic spine.    Objective:  Filed Vitals:   03/11/15 1415  BP: 153/80  Pulse: 74  Temp: 98.7 F (37.1 C)  Resp: 16    Body mass index is 23.5 kg/(m^2).  Intake/Output Summary (Last 24 hours) at 03/11/15 1737 Last data filed at 03/11/15 1518  Gross per 24 hour  Intake   1080 ml  Output    500 ml  Net    580 ml     Sclerae unicteric  Oropharynx clear  No peripheral adenopathy  Lungs clear -- no rales or rhonchi  Heart regular rate and rhythm  Abdomen benign. (+) mild tenderness at left para-sacral area, no skin or subcutaneous lesion  MSK no focal spinal tenderness, no peripheral edema  Neuro nonfocal    CBG (last 3)   Recent Labs  03/11/15 0809 03/11/15 1229 03/11/15 1645  GLUCAP 88 104* 134*     Labs:  Lab Results  Component Value Date   WBC 10.0 03/10/2015   HGB 11.8* 03/10/2015   HCT 36.6 03/10/2015   MCV 89.7 03/10/2015   PLT 200 03/10/2015   NEUTROABS 5.2 03/08/2015    '@LASTCHEMISTRY'$ @  Urine Studies No results for input(s): UHGB, CRYS in the last 72 hours.  Invalid input(s): UACOL, UAPR, USPG, UPH, UTP, UGL, UKET, UBIL, UNIT, UROB, ULEU, UEPI, UWBC, URBC, UBAC, CAST, [place], [place]  Basic Metabolic Panel:  Recent Labs Lab 03/08/15 0914 03/09/15 0455 03/10/15 0522  NA 136 144 140  K 3.6 3.0* 3.6  CL 102 107 106  CO2 '26 26 27  '$ GLUCOSE 129* 107* 94  BUN '13 13 9  '$ CREATININE 0.54 0.44* 0.47*  CALCIUM 9.4 9.1 9.3   GFR Estimated Creatinine Clearance: 59.7 mL/min (by C-G formula based on Cr of 0.47). Liver Function Tests:  Recent Labs Lab 03/08/15 0914 03/09/15 0455  AST 24 24  ALT 12 12  ALKPHOS 272* 249*  BILITOT 0.7 0.9  PROT 7.2 7.0  ALBUMIN 4.0 3.9    Recent Labs Lab 03/08/15 0914  LIPASE 18   No results for input(s): AMMONIA  in the last 168 hours. Coagulation profile No results for input(s): INR, PROTIME in the last 168 hours.  CBC:  Recent Labs Lab 03/08/15 0914 03/09/15 0455 03/10/15 0522  WBC 7.0 8.4 10.0  NEUTROABS 5.2  --   --   HGB 12.9 12.1 11.8*  HCT 40.0 37.6 36.6  MCV 89.9 90.4 89.7  PLT 184 194 200   Cardiac Enzymes: No results for input(s): CKTOTAL, CKMB, CKMBINDEX, TROPONINI in the last 168 hours. BNP: Invalid input(s): POCBNP CBG:  Recent Labs Lab 03/11/15 0106 03/11/15 0500 03/11/15 0809 03/11/15 1229 03/11/15 1645  GLUCAP 104* 100* 88 104* 134*   D-Dimer No results for input(s): DDIMER in the last 72 hours. Hgb A1c No results for input(s): HGBA1C in the last 72 hours. Lipid Profile No results for input(s): CHOL, HDL, LDLCALC, TRIG, CHOLHDL, LDLDIRECT in the last 72 hours. Thyroid function studies No results for input(s): TSH, T4TOTAL, T3FREE, THYROIDAB in the last 72 hours.  Invalid input(s): FREET3 Anemia work up No results for input(s): VITAMINB12, FOLATE, FERRITIN, TIBC, IRON, RETICCTPCT in the last 72 hours. Microbiology No results found for this or any previous visit (from the past 240 hour(s)).    Studies:  No results  found.  Assessment: 64 y.o.  female, with significant history of smoking and stage I right lung adenocarcinoma status post lobectomy in 2014, no presented with was any back pain and diffuse bone lesions on the lumbar spine MRI.  1. 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 Nausea and vomiting 3. 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 Bipolar   Plan:  -appreciate inpt team care -appreciate radiation oncologist Dr. Clabe Seal input, possible left pelvic irradiation for bone metastasis related pain.  -I think he we can advance her diet, she had barium swallow on 02/18/2015 which showed normal esophageal structure, nonspecific esophageal more activity disorder. -I discussed the disposition was atrial fibrillation and.  She is leaning towards to go to her daughter's house after discharge. She states her daughter were stay with her most of time. -I spoke with pathologist Dr. Saralyn Pilar today. He is going to do some immunostain on her bone biopsy, if it is confirmed lung primary, I will request [facility] one test to be done on her prior lung cancer sample.  I'll follow as needed. Please call me if you have any questions.  I'll plan to see her back on week after her hospital discharge, I'll set up her appointments.   Truitt Merle, MD 03/11/2015  5:37 PM

## 2015-03-11 NOTE — Progress Notes (Signed)
PT Cancellation Note  Patient Details Name: [name] [name] MRN: [id number] DOB: 01/04/1951   Cancelled Treatment:    Reason Eval/Treat Not Completed: Other (comment) (pt reports being "busy", with meal tray, will check back as schedule permits.)   Drevon Plog,[name] 03/11/2015, 9:22 AM [name], PT, DPT 03/11/2015 Pager: [phone]

## 2015-03-11 NOTE — Evaluation (Signed)
Physical Therapy Evaluation Patient Details Name: [name] [name] MRN: [id number] DOB: 28-Jul-1951 Today's Date: 03/11/2015   History of Present Illness  64 year old female with a past medical history of adenocarcinoma of right lung status post lobectomy having widespread bony metastatic disease, COPD, DM, chronic back, shoulder, and neck pain, bipolar disorder who was admitted 03/08/2015 with complaints of abdominal pain associated with nausea and vomiting  Clinical Impression  Pt admitted with above diagnosis. Pt currently with functional limitations due to the deficits listed below (see PT Problem List).  Pt will benefit from skilled PT to increase their independence and safety with mobility to allow discharge to the venue listed below.   Pt more happy to ambulate later this morning and requests PT check back on her at least once more during acute stay to make sure she continues to mobilize well.     Follow Up Recommendations Home health PT (pt considering HHPT, wants to decide later)    Equipment Recommendations  None recommended by PT    Recommendations for Other Services       Precautions / Restrictions Precautions Precautions: None      Mobility  Bed Mobility Overal bed mobility: Modified Independent                Transfers Overall transfer level: Needs assistance   Transfers: Sit to/from Stand Sit to Stand: Min guard         General transfer comment: min/guard for safety  Ambulation/Gait Ambulation/Gait assistance: Min guard Ambulation Distance (Feet): 200 Feet Assistive device:  (utilized IV pole for support) Gait Pattern/deviations: Step-through pattern;Decreased stride length Gait velocity: decr   General Gait Details: pt utilized IV pole for a little more support, states she uses SPC and RW at home as needed when having pain.  Stairs            Wheelchair Mobility    Modified Rankin (Stroke Patients Only)       Balance Overall  balance assessment: No apparent balance deficits (not formally assessed) (reports no hx of falls)                                           Pertinent Vitals/Pain Pain Assessment: No/denies pain (premedicated, reports chronic L sided pain)    Home Living Family/patient expects to be discharged to:: Private residence Living Arrangements: Alone Available Help at Discharge: Family Type of Home: House       Home Layout: Able to live on main level with bedroom/bathroom Home Equipment: Gilford Rile - 2 wheels;Cane - single point Additional Comments: pt lives alone with flight of stairs at home so plans to d/c home to daughters house as above    Prior Function Level of Independence: Independent with assistive device(s)         Comments: uses assistive device as needed due to pain     Hand Dominance        Extremity/Trunk Assessment               Lower Extremity Assessment: Generalized weakness         Communication   Communication: No difficulties  Cognition Arousal/Alertness: Awake/alert Behavior During Therapy: WFL for tasks assessed/performed Overall Cognitive Status: Within Functional Limits for tasks assessed                      General Comments  Exercises        Assessment/Plan    PT Assessment Patient needs continued PT services  PT Diagnosis Difficulty walking;Generalized weakness   PT Problem List Decreased strength;Decreased activity tolerance;Decreased mobility  PT Treatment Interventions DME instruction;Gait training;Functional mobility training;Patient/family education;Stair training;Therapeutic activities;Therapeutic exercise   PT Goals (Current goals can be found in the Care Plan section) Acute Rehab PT Goals PT Goal Formulation: With patient Time For Goal Achievement: 03/18/15 Potential to Achieve Goals: Good    Frequency Min 3X/week   Barriers to discharge        Co-evaluation               End  of Session Equipment Utilized During Treatment: Gait belt Activity Tolerance: Patient tolerated treatment well Patient left: in bed;with call bell/phone within reach           Time: 1107-1124 PT Time Calculation (min) (ACUTE ONLY): 17 min   Charges:   PT Evaluation $Initial PT Evaluation Tier I: 1 Procedure     PT G Codes:        [name] [name],[name] 03/11/2015, 12:04 PM [name], PT, DPT 03/11/2015 Pager: [phone]

## 2015-03-11 NOTE — Progress Notes (Signed)
Page [facility] visit  Ms Morlock is spiritually and emotionally anxious concerning her present condition. She feels that physicians did not listen to her two years ago with symptoms started developing, which, she report, allowed her cancer to spread untreated. Currently, she has high praise and appreciation for the staff of [facility] and the [facility] for their exceptional care. This care has lowered her stress greatly. Her anticipation of beginning Chemo and/or radiation therapies is another great stresser. She was encouraged to speak openly and honestly with staff and ask questions to allow the staff to better keep her informed.   Ms Whitehead is deeply religious, and a practicing Christian. She has not told her community of faith about her hospitalization. She was encouraged to tell any that may provide care and comfort about her diagnosis, so a support network can be established for the long run of her cancer care. She reports a good family support network.   To assist in lowering her spiritual pain and stress, coming from her fears of cancer and cancer treatments, Ms Berwanger was guided through mediation therapy (also known as relaxation therapy), She reported that her anxiety at the start of our visit at a 7 on a scale of 1 to 10 (with 10 being the worst stress she has ever experienced) after the medication therapy she reported her stress level reduced to a 2. She reports her mind stays flooded with thoughts and she has trouble sleeping. After mediation therapy she report she saw a spiritual path to restful sleep.  Recommend paging a chaplain should Ms Inscore need or wish further spiritual care.  Sallee Lange. Berlyn Saylor, [place]

## 2015-03-11 NOTE — Progress Notes (Signed)
Patient XH:BZJIRCVEL Buch      DOB: [birth year]      FYB:[id number]   Palliative Medicine Team at [facility] Progress Note    Subjective:  Continues with  c/o  Pain       Verbalizes fear and anxiety related to her disease and treatment decsions    Filed Vitals:   03/11/15 0500  BP: 146/70  Pulse: 82  Temp: 98.5 F (36.9 C)  Resp: 20   Physical exam: GEN: alert, appears more comfortable today until talking about issues she has with care CV: Regular rate ABD: soft Psych: easily upset when talking about her care  CBC    Component Value Date/Time   WBC 10.0 03/10/2015 0522   WBC 7.5 02/11/2015 1610   RBC 4.08 03/10/2015 0522   RBC 4.38 02/11/2015 1610   HGB 11.8* 03/10/2015 0522   HGB 12.6 02/11/2015 1610   HCT 36.6 03/10/2015 0522   HCT 39.1 02/11/2015 1610   PLT 200 03/10/2015 0522   PLT 246 02/11/2015 1610   MCV 89.7 03/10/2015 0522   MCV 89.2 02/11/2015 1610   MCH 28.9 03/10/2015 0522   MCH 28.7 02/11/2015 1610   MCHC 32.2 03/10/2015 0522   MCHC 32.2 02/11/2015 1610   RDW 13.8 03/10/2015 0522   RDW 13.5 02/11/2015 1610   LYMPHSABS 1.3 03/08/2015 0914   LYMPHSABS 2.1 02/11/2015 1610   MONOABS 0.5 03/08/2015 0914   MONOABS 0.5 02/11/2015 1610   EOSABS 0.0 03/08/2015 0914   EOSABS 0.1 02/11/2015 1610   BASOSABS 0.0 03/08/2015 0914   BASOSABS 0.1 02/11/2015 1610    CMP     Component Value Date/Time   NA 140 03/10/2015 0522   K 3.6 03/10/2015 0522   CL 106 03/10/2015 0522   CO2 27 03/10/2015 0522   GLUCOSE 94 03/10/2015 0522   BUN 9 03/10/2015 0522   CREATININE 0.47* 03/10/2015 0522   CALCIUM 9.3 03/10/2015 0522   PROT 7.0 03/09/2015 0455   ALBUMIN 3.9 03/09/2015 0455   AST 24 03/09/2015 0455   ALT 12 03/09/2015 0455   ALKPHOS 249* 03/09/2015 0455   BILITOT 0.9 03/09/2015 0455   GFRNONAA >90 03/10/2015 0522   GFRAA >90 03/10/2015 0522      Assessment and plan: 64 yo female with Dm, Chronic back/abd pain, Bipolar, Hep C, stage I NSCLC with  recent diffuse osseous metastatic disease presumed 2/2 primary lung CA recurrence. Admitted with uncontrolled pain, N/V.   1. Code Status: listed as full, I did not discuss and recommended she consider a DNR/DNI status knowing limited results in similar patents   2. GOC:  [name] open to all offered and available medical interventions to treat her cancer and prolong quality life. As of today plan is to discharge home with her daughter, palliative radiation in OP setting and if her functional status improves meet with Dr Burr Medico to discuss systemic chemotherapy in the near future.  Pain management is a priorit   3. Symptom Management:  1. Cancer Related Pain:    She is still c/o uncontrolled pain, area specific to low left lumbar pain.  She reports feeling like, "everyone thinks I'm a drug addict"   We discussed pain management strategies and importance of managing  her pain on oral agents with the goal of getting her home.     Continue oxycontin to '40mg'$  BID and keep increase oxycodone to 10 mg PO q 3 hrs prn (utilize first before IV  Dilaudid, discussed with nursing) consider  increasing Oxcontin to TID in am   2. Nausea/Vomiting: resolved, advance diet as tolerated 3. Anxiety/Bipolar: Continue Xanax and  Celexa as ordered  4. Psychosocial/Spiritual: Emotional support offered to patient at bedside.  Conversation regarding diagnosis and prognosis and her overall goals of care was had.  [name] having a difficult time processing not just the emotional component but the complexity of the information being delivered.  Will continue to support holistically    Total Time: 60 minutes  Time in 1100  Time out 1200 >50% of time spent in counseling and coordination of care regarding above  Wadie Lessen NP  Palliative Medicine Team Team Phone # [phone] Pager [phone]  Discussed with Dr Darrick Meigs

## 2015-03-11 NOTE — Progress Notes (Signed)
[facility] PROGRESS NOTE  [name] [name] CHE:[id number] DOB: Sep 04, 1951 DOA: 03/08/2015 PCP: Harvie Junior, MD  Assessment/Plan:  1.Metastatic adenocarcinoma of lung. -Patient having a history of well-differentiated adenocarcinoma of lung undergoing lobectomy in 2014. Found to have widespread metastatic disease. She had recent biopsy of suspicious lesion involving left iliac crest revealed adenocarcinoma having similar morphologic features to previous right lung adenocarcinoma. Patient was evaluated by medical oncology, may not be good candidate for systemic chemotherapy. Patient underwent Port-A-Cath placement on 03/09/2015. Patient was seen by radiation oncology for palliative radiation therapy.  Will call radiation oncology to discuss the plan for radiation.  2. Nausea/vomiting. -Symptoms improving, could be secondary to underlying malignancy. She underwent workup on a recent hospitalization undergoing esophagram on 02/18/2015 which did not show structural abnormality. Continue with Reglan 5 mg 3 times a day  3. Abdominal pain. -Patient continues to complain of abdominal pain suspect related to underlying malignancy. On recent hospitalization for similar symptoms CT scan of abdomen and pelvis with IV contrast showed normal liver, gallbladder, without evidence of pancreatic mass. It was negative for bowel obstruction.  -Palliative medicine consulted for assistance with pain management.  meds have been adjusted, will assess the response to new regimen.  4. Diabetes mellitus. -Her metformin was held on admission given nothing by mouth status. Continue Accu-Cheks every 4 hours with sliding scale coverage  5. Hypertension. -Systolic blood pressures remained in the 140s to 160s, metoprolol was increased to 50 mg by mouth twice a day  6. Cancer related pain. -Palliative care was consulted, she was started on oxycodone 30 mg every 12 hours, Decadron 4 mg by mouth daily.  Continue oxycodone 5-10 mg every 3 years as needed for severe breakthrough pain  7. Hypokalemia Resolved  Code Status: Full code Family Communication: No family present at bedside Disposition Plan: Home when stable   Consultants:  Palliative care  Oncology  Procedures:  None  Antibiotics:  None  HPI/Subjective: 64 year old female with a past medical history of adenocarcinoma of right lung status post lobectomy having widespread bony metastatic disease, who was admitted to the medicine service on 03/08/2015 when she presented with complaints of abdominal pain associated with nausea and vomiting. She had a similar presentation on 02/17/2015 with symptoms resolving after bowel rest, analgesia and as needed antiemetic therapy. Her oncologist Dr.Feng evaluated patient during this hospitalization, felt that nausea and vomiting could be related to underlying malignancy vs SBO. Patient's symptoms improved by the following day with resolution to nausea and vomiting. Pain symptoms also improving. Dr Deitra Mayo of Palliative Medicine was consulted for assistance in management of symptoms. She was started on decadron along with oxycontin 30 mg PO BID which seemed to help out her pain. Her diet was advanced to clears. During this hospitalization interventional radiology placed power port, which was ordered by her oncologist.  Patient seen and examined, says the pain is better.   Objective: Filed Vitals:   03/11/15 1415  BP: 153/80  Pulse: 74  Temp: 98.7 F (37.1 C)  Resp: 16    Intake/Output Summary (Last 24 hours) at 03/11/15 1633 Last data filed at 03/11/15 1100  Gross per 24 hour  Intake    840 ml  Output    500 ml  Net    340 ml   Filed Weights   03/11/15 1316  Weight: 62.143 kg (137 lb)    Exam:   General:  Appears in no acute distress  Cardiovascular: S1-S2 is regular  Respiratory: Clear to auscultation bilaterally  Abdomen: Soft, nontender no  organomegaly  Musculoskeletal: No edema of lower extremities noted   Data Reviewed: Basic Metabolic Panel:  Recent Labs Lab 03/08/15 0914 03/09/15 0455 03/10/15 0522  NA 136 144 140  K 3.6 3.0* 3.6  CL 102 107 106  CO2 '26 26 27  '$ GLUCOSE 129* 107* 94  BUN '13 13 9  '$ CREATININE 0.54 0.44* 0.47*  CALCIUM 9.4 9.1 9.3   Liver Function Tests:  Recent Labs Lab 03/08/15 0914 03/09/15 0455  AST 24 24  ALT 12 12  ALKPHOS 272* 249*  BILITOT 0.7 0.9  PROT 7.2 7.0  ALBUMIN 4.0 3.9    Recent Labs Lab 03/08/15 0914  LIPASE 18   No results for input(s): AMMONIA in the last 168 hours. CBC:  Recent Labs Lab 03/08/15 0914 03/09/15 0455 03/10/15 0522  WBC 7.0 8.4 10.0  NEUTROABS 5.2  --   --   HGB 12.9 12.1 11.8*  HCT 40.0 37.6 36.6  MCV 89.9 90.4 89.7  PLT 184 194 200   Cardiac Enzymes: No results for input(s): CKTOTAL, CKMB, CKMBINDEX, TROPONINI in the last 168 hours. BNP (last 3 results) No results for input(s): BNP in the last 8760 hours.  ProBNP (last 3 results) No results for input(s): PROBNP in the last 8760 hours.  CBG:  Recent Labs Lab 03/10/15 2041 03/11/15 0106 03/11/15 0500 03/11/15 0809 03/11/15 1229  GLUCAP 135* 104* 100* 88 104*    No results found for this or any previous visit (from the past 240 hour(s)).   Studies: No results found.  Scheduled Meds: . ALPRAZolam  0.5 mg Oral TID  . citalopram  20 mg Oral Daily  . dexamethasone  4 mg Oral Daily  . gabapentin  400 mg Oral TID  . heparin  5,000 Units Subcutaneous 3 times per day  . insulin aspart  0-15 Units Subcutaneous 6 times per day  . metoCLOPramide (REGLAN) injection  5 mg Intravenous 3 times per day  . metoprolol tartrate  50 mg Oral BID  . OXcarbazepine  150 mg Oral TID  . OxyCODONE  40 mg Oral Q12H  . polyethylene glycol  17 g Oral Daily  . senna-docusate  2 tablet Oral BID   Continuous Infusions: . sodium chloride 100 mL/hr at 03/10/15 1019    Principal Problem:    Metastatic adenocarcinoma Active Problems:   Lung cancer, right upper lobe   Hypertension   Metastatic primary lung cancer   Nausea with vomiting   Cancer associated pain   CN (constipation)   Lung cancer   Metastatic cancer to bone   Depression    Time spent: 25 minutes    [facility] Pager [phone]. If 7PM-7AM, please contact night-coverage at [email or web address], password [facility] 03/11/2015, 4:33 PM  LOS: 3 days

## 2015-03-12 DIAGNOSIS — Z515 Encounter for palliative care: Secondary | ICD-10-CM | POA: Insufficient documentation

## 2015-03-12 DIAGNOSIS — F411 Generalized anxiety disorder: Secondary | ICD-10-CM | POA: Insufficient documentation

## 2015-03-12 LAB — GLUCOSE, CAPILLARY
GLUCOSE-CAPILLARY: 91 mg/dL (ref 70–99)
GLUCOSE-CAPILLARY: 91 mg/dL (ref 70–99)
Glucose-Capillary: 110 mg/dL — ABNORMAL HIGH (ref 70–99)

## 2015-03-12 MED ORDER — SENNOSIDES-DOCUSATE SODIUM 8.6-50 MG PO TABS: 2.0000 | ORAL_TABLET | Freq: Two times a day (BID) | ORAL | Status: DC

## 2015-03-12 MED ORDER — HEPARIN SOD (PORK) LOCK FLUSH 100 UNIT/ML IV SOLN
500.0000 [IU] | INTRAVENOUS | Status: DC | PRN
  Filled 2015-03-12: qty 5

## 2015-03-12 MED ORDER — OXYCODONE HCL 10 MG PO TABS: 10.0000 mg | ORAL_TABLET | ORAL | Status: DC | PRN

## 2015-03-12 MED ORDER — METOPROLOL TARTRATE 50 MG PO TABS: 50.0000 mg | ORAL_TABLET | Freq: Two times a day (BID) | ORAL | Status: DC

## 2015-03-12 MED ORDER — OXYCODONE HCL 5 MG PO TABS: 10.0000 mg | ORAL_TABLET | ORAL | Status: DC | PRN

## 2015-03-12 MED ORDER — OLMESARTAN MEDOXOMIL 40 MG PO TABS: 40.0000 mg | ORAL_TABLET | Freq: Every day | ORAL | Status: DC

## 2015-03-12 MED ORDER — METOCLOPRAMIDE HCL 5 MG PO TABS: 5.0000 mg | ORAL_TABLET | Freq: Three times a day (TID) | ORAL | Status: AC

## 2015-03-12 MED ORDER — OXYCODONE HCL ER 40 MG PO T12A: 40.0000 mg | EXTENDED_RELEASE_TABLET | Freq: Three times a day (TID) | ORAL | Status: DC

## 2015-03-12 MED ORDER — OXYCODONE HCL ER 40 MG PO T12A
40.0000 mg | EXTENDED_RELEASE_TABLET | Freq: Three times a day (TID) | ORAL | Status: DC
Start: 2015-03-12 — End: 2015-03-19

## 2015-03-12 MED ORDER — DEXAMETHASONE 4 MG PO TABS: 4.0000 mg | ORAL_TABLET | Freq: Every day | ORAL | Status: DC

## 2015-03-12 NOTE — Care Management Note (Signed)
CARE MANAGEMENT NOTE 03/12/2015  Patient:  [name] [name], [name] [name]   Account Number:  [id number]  Date Initiated:  03/10/2015  Documentation initiated by:  Marney Doctor  Subjective/Objective Assessment:   64 yo admitted with Metastatic Adenocarcinoma     Action/Plan:   Pt from home alone   Anticipated DC Date:  03/13/2015   Anticipated DC Plan:  [place]  CM consult      Choice offered to / List presented to:             Status of service:  In process, will continue to follow Medicare Important Message given?  YES (If response is "NO", the following Medicare IM given date fields will be blank) Date Medicare IM given:  03/12/2015 Medicare IM given by:  Marney Doctor Date Additional Medicare IM given:   Additional Medicare IM given by:    Discharge Disposition:    Per UR Regulation:  Reviewed for med. necessity/level of care/duration of stay  If discussed at [place] of Stay Meetings, dates discussed:    Comments:  03/12/15 Marney Doctor RN,BSN,NCM [phone] PT recommending HHPT. Spoke with pt at bedside to offer choice. Pt politely declines [place] services at this time.   03/10/15 Marney Doctor RN,BSN,NCM [phone] Chart reviewed. Unsure of disposition plan at this time. Palliative Medicine following. PT eval ordered to assist with disposition planning. Will await PT recommendations and assist with DC planning as needed.

## 2015-03-12 NOTE — Discharge Summary (Signed)
Physician Discharge Summary  Genae Strine LPF:[id number] DOB: Jun 04, 1951 DOA: 03/08/2015  PCP: Harvie Junior, MD  Admit date: 03/08/2015 Discharge date: 03/12/2015  Time spent: *25 minutes  Recommendations for Outpatient Follow-up:  1. *Follow up Dr Burr Medico in one week  Discharge Diagnoses:  Principal Problem:   Metastatic adenocarcinoma Active Problems:   Lung cancer, right upper lobe   Hypertension   Metastatic primary lung cancer   Nausea with vomiting   Cancer associated pain   CN (constipation)   Lung cancer   Metastatic cancer to bone   Depression   Anxiety state   Palliative care encounter   Discharge Condition: Stable  Diet recommendation: Low salt diet  Filed Weights   03/11/15 1316  Weight: 62.143 kg (137 lb)    History of present illness:  64 y.o. 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 Lesions appeared to have a sclerotic appearance. On 03/04/2015 she underwent CT-guided biopsy of lytic lesion within the posterior aspect of the left iliac crest, pathology showing metastatic adenocarcinoma with abundant mucin morphologic features compatible with metastasis from a previous right upper lobe adenocarcinoma. She was recently admitted to the medicine service on 02/17/2015 and discharged on 02/19/2015 at which time she was treated for abdominal pain. Patient reporting ongoing abdominal pain associate with nausea and vomiting over the past several weeks, having difficulties keeping by mouth down. She also complains of intermittent chest pain along with back pain and bilateral lower extremity pain. She presented to the emergency department early this morning where she was discharged then went to the [facility] with the above complaints and was made a  direct admission. She reports having chills however denies fevers.   Hospital Course:  1.Metastatic adenocarcinoma of lung. -Patient having a history of well-differentiated adenocarcinoma of lung undergoing lobectomy in 2014. Found to have widespread metastatic disease. She had recent biopsy of suspicious lesion involving left iliac crest revealed adenocarcinoma having similar morphologic features to previous right lung adenocarcinoma. Patient was evaluated by medical oncology, may not be good candidate for systemic chemotherapy. Patient underwent Port-A-Cath placement on 03/09/2015. Patient was seen by radiation oncology for palliative radiation therapy.  Discussed with Dr Burr Medico, she will follow the patient in one week.  2. Nausea/vomiting. -Symptoms improving, could be secondary to underlying malignancy. She underwent workup on a recent hospitalization undergoing esophagram on 02/18/2015 which did not show structural abnormality. Continue with Reglan 5 mg 3 times a day  3. Abdominal pain. -Patient continues to complain of abdominal pain suspect related to underlying malignancy. On recent hospitalization for similar symptoms CT scan of abdomen and pelvis with IV contrast showed normal liver, gallbladder, without evidence of pancreatic mass. It was negative for bowel obstruction.  -Palliative medicine consulted for assistance with pain management.  - pain is well controlled, will discharge on Reglan mg po TID  4. Diabetes mellitus. Continue metformin  5. Hypertension. -Systolic blood pressures remained in the 140s to 160s, metoprolol was increased to 50 mg by mouth twice a day. Will d/c the benicar/hct, and start benicar 40 mg po daily  6. Cancer related pain. -Palliative care was consulted, and now she is on Oxycontin 40 mg po tid and oxycodone 10 mg po q 6 hr prn  7. Hypokalemia Resolved  Procedures:  None  Consultations:  Palliative  care  Discharge Exam: Filed  Vitals:   03/12/15 0424  BP: 156/73  Pulse: 82  Temp: 98.4 F (36.9 C)  Resp: 18    General: Appear in no acute distress Cardiovascular: S1s2 RRR Respiratory: Clear bilaterally  Discharge Instructions   Discharge Instructions    Diet - low sodium heart healthy    Complete by:  As directed      Increase activity slowly    Complete by:  As directed           Current Discharge Medication List    START taking these medications   Details  dexamethasone (DECADRON) 4 MG tablet Take 1 tablet (4 mg total) by mouth daily. Qty: 30 tablet, Refills: 2    olmesartan (BENICAR) 40 MG tablet Take 1 tablet (40 mg total) by mouth daily. Qty: 30 tablet, Refills: 2    OxyCODONE (OXYCONTIN) 40 mg T12A 12 hr tablet Take 1 tablet (40 mg total) by mouth every 8 (eight) hours. Qty: 30 tablet, Refills: 0    oxyCODONE 10 MG TABS Take 1 tablet (10 mg total) by mouth every 4 (four) hours as needed for moderate pain. Qty: 30 tablet, Refills: 0      CONTINUE these medications which have CHANGED   Details  metoprolol (LOPRESSOR) 50 MG tablet Take 1 tablet (50 mg total) by mouth 2 (two) times daily. Qty: 60 tablet, Refills: 2    senna-docusate (SENOKOT-S) 8.6-50 MG per tablet Take 2 tablets by mouth 2 (two) times daily. Qty: 30 tablet, Refills: 2      CONTINUE these medications which have NOT CHANGED   Details  ALPRAZolam (XANAX) 1 MG tablet Take 0.5 mg by mouth 2 (two) times daily.     Cyanocobalamin (VITAMIN B-12 IJ) Inject as directed every 30 (thirty) days. Administered by Dr. York Ram around the 1st of each month    dicyclomine (BENTYL) 20 MG tablet Take 20 mg by mouth 3 (three) times daily with meals.    gabapentin (NEURONTIN) 400 MG capsule Take 400 mg by mouth 3 (three) times daily. Nerve pain    hydrOXYzine (VISTARIL) 25 MG capsule Take 25 mg by mouth daily.    Ipratropium-Albuterol (COMBIVENT RESPIMAT) 20-100 MCG/ACT AERS respimat Inhale 2 puffs into the lungs daily as  needed for wheezing or shortness of breath.     KLOR-CON M20 20 MEQ tablet Take 20 mEq by mouth every morning. with food Refills: 6    loratadine (CLARITIN) 10 MG tablet Take 10 mg by mouth every morning.     Menthol-Methyl Salicylate (MUSCLE RUB) 10-15 % CREA Apply 1 application topically daily as needed for muscle pain. Left shoulder pain    metFORMIN (GLUCOPHAGE) 500 MG tablet Take 500 mg by mouth 2 (two) times daily with a meal.    omeprazole (PRILOSEC) 20 MG capsule Take 20 mg by mouth 2 (two) times daily before a meal.     ondansetron (ZOFRAN-ODT) 8 MG disintegrating tablet Take 1 tablet by mouth every 6 (six) hours as needed for nausea or vomiting.     OXcarbazepine (TRILEPTAL) 150 MG tablet Take 150 mg by mouth 3 (three) times daily.     polyethylene glycol (MIRALAX / GLYCOLAX) packet Take 17 g by mouth 2 (two) times daily as needed for mild constipation or moderate constipation.    promethazine (PHENERGAN) 25 MG tablet Take 1 tablet (25 mg total) by mouth every 8 (eight) hours as needed for nausea or vomiting. Qty: 15 tablet, Refills: 0  simvastatin (ZOCOR) 20 MG tablet Take 20 mg by mouth at bedtime.     tiZANidine (ZANAFLEX) 4 MG tablet Take 4 mg by mouth every 8 (eight) hours as needed for muscle spasms.    triamcinolone cream (KENALOG) 0.1 % Apply 1 application topically 2 (two) times daily as needed (for rash).    Associated Diagnoses: Malignant neoplasm of upper lobe of right lung    Vitamin D, Ergocalciferol, (DRISDOL) 50000 UNITS CAPS capsule Take 50,000 Units by mouth every Monday.       STOP taking these medications     HYDROcodone-acetaminophen (NORCO) 10-325 MG per tablet      olmesartan-hydrochlorothiazide (BENICAR HCT) 40-25 MG per tablet        Allergies  Allergen Reactions  . Haldol [Haloperidol Decanoate] Other (See Comments)    tongue swelling  . Acetaminophen Other (See Comments)    Stomach pain  . Ibuprofen Other (See Comments)    Stomach  pain  . Metronidazole Other (See Comments)    Severe stomach upset--was instructed to avoid Flagyl.  . Morphine And Related Hives    Tolerates hydromorphone.  Marland Kitchen Penicillins Rash   Follow-up Information    Follow up with Truitt Merle, MD In 1 week.   Specialties:  Hematology, Oncology   Contact information:   [place] [id number] [phone]        The results of significant diagnostics from this hospitalization (including imaging, microbiology, ancillary and laboratory) are listed below for reference.    Significant Diagnostic Studies: Dg Chest 2 View  02/17/2015   CLINICAL DATA:  Abdominal pain and emesis  EXAM: CHEST  2 VIEW  COMPARISON:  03/05/2014  FINDINGS: Normal heart size and stable aortic and hilar contours. There is unchanged distortion of right hilar architecture related to upper lobectomy. There is no edema, consolidation, effusion, or pneumothorax. Mild thoracic dextroscoliosis. No acute osseous findings.  IMPRESSION: 1. No active cardiopulmonary disease. 2. Right upper lobectomy.   Electronically Signed   By: Monte Fantasia M.D.   On: 02/17/2015 07:17   Ct Chest Wo Contrast  02/24/2015   CLINICAL DATA:  Right upper lobectomy for cancer. MRI on 02/02/2015 showed diffuse osseous metastatic disease.  EXAM: CT CHEST WITHOUT CONTRAST  TECHNIQUE: Multidetector CT imaging of the chest was performed following the standard protocol without IV contrast.  COMPARISON:  Multiple exams, including 02/17/2015 and 09/10/2014  FINDINGS: Mediastinum/Nodes: Right upper paratracheal lymph node 0.8 cm on image 10, series 2, not well shown previously. Currently no pathologic thoracic adenopathy is identified. Thyroid gland unremarkable. Aortic atherosclerotic calcification noted.  Lungs/Pleura: Postoperative findings right upper lobectomy. Emphysema. Minimal lingular scarring and minimal left lower lobe scarring anteriorly. No recurrent lung mass. No pleural mass identified.  6 mm  ground-glass density nodule on image 22 of series 603 in the left lower lobe, equivocally questionably enlarging compared to 12/28/12.  Upper abdomen: Mild extrahepatic biliary dilatation, up to 9 mm, significance uncertain. This is not dissimilar to prior exams. Gastrohepatic ligament lymph node 0.9 cm in short axis, image 54 series 2. (Previously the same).  Musculoskeletal: Widespread osseous metastatic disease primarily manifesting as multifocal variable density (some sclerotic, some lytic) lesions less readily seen in the spine and sternum, although some slight rib endosteal irregularities can also be appreciated on care for review. A manubrial lucent lesion in the sternum measures 2.0 by 1.0 cm on image 49 of series 603, and was not present at all on the prior chest CT. The  spine lesions and other bony lesions are also not readily apparent on prior CT, indicating a rapid onset. An index lytic lesion in the T5 vertebral body measures 1.5 by 1.1 by 1.3 cm, and all the though the base of vertebral plexus extends towards this lesion I do not see definite extension outside of the vertebral body.  IMPRESSION: 1. No compelling new pulmonary mass is identified as a primary site for the diffuse osseous metastatic disease. However, new osseous metastatic lesions are present throughout the thoracic spine, sternum, and some of the ribs. 2. Very faint ground-glass density 5 mm pulmonary nodule in the left lower lobe, questionable change compared back through 12/28/2012. This may merit observation.   Electronically Signed   By: Van Clines M.D.   On: 02/24/2015 21:39   Mr Brain Wo Contrast  03/01/2015   CLINICAL DATA:  Osseous metastatic disease. Prior right upper lobectomy for lung cancer.  EXAM: MRI HEAD WITHOUT CONTRAST  TECHNIQUE: Multiplanar, multiecho pulse sequences of the brain and surrounding structures were obtained without intravenous contrast.  COMPARISON:  08/27/2012  FINDINGS: IV access could not be  obtained despite multiple attempts by the IV team. Contrast could therefore not be administered. Coronal T2 weighted images were not performed as the patient was in pain and could not tolerate further imaging.  Bone marrow signal of the skull and visualized upper cervical spine is diffusely heterogeneous consistent with known widespread osseous metastases. A left parietal skull metastasis extends through the inner table and may involve the dura (series 9, image 34).  Sequelae of prior left frontal craniotomy are again identified. There is underlying left frontal lobe encephalomalacia with a small amount of chronic blood products present. Confluent T2 hyperintensity in the left frontal white matter in this region is unchanged and presumably reflects gliosis. There is no evidence of acute infarct, midline shift, or extra-axial fluid collection. There is mild generalized cerebral atrophy, within normal limits for age. Scattered, small foci of T2 hyperintensity in the cerebral white matter are similar to the prior MRI and nonspecific but compatible with mild chronic small vessel ischemic disease. No parenchymal brain mass identified, however evaluation is limited by lack of IV contrast.  Orbits are unremarkable. Paranasal sinuses are clear. No significant mastoid effusion is seen. Major intracranial vascular flow voids are preserved, with the distal right vertebral artery appearing hypoplastic.  IMPRESSION: 1. 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 2. No parenchymal brain mass identified, however evaluation for small lesions is limited by lack of IV contrast. Postcontrast brain MRI is recommended when/if IV access is obtained. 3. Unchanged left frontal encephalomalacia.   Electronically Signed   By: Logan Bores   On: 03/01/2015 17:09   Ct Abdomen Pelvis W Contrast  02/17/2015   CLINICAL DATA:  Acute abdominal pain.  History lung cancer  EXAM: CT ABDOMEN AND  PELVIS WITH CONTRAST  TECHNIQUE: Multidetector CT imaging of the abdomen and pelvis was performed using the standard protocol following bolus administration of intravenous contrast.  CONTRAST:  142m OMNIPAQUE IOHEXOL 300 MG/ML  SOLN  COMPARISON:  CT abdomen 12/05/2014  FINDINGS: Lung bases are clear.  Hiatal hernia noted.  Normal liver. No liver mass lesion. Gallbladder normal. Common bile duct measures approximately 11 mm in diameter and is mildly enlarged. Correlate with liver function tests. Common bile duct slightly more prominent compared with the prior CT 12/05/2014. Pancreatic duct nondilated. No pancreatic mass. Negative for pancreatitis. Normal spleen.  Normal kidneys.  No renal mass or obstruction. No renal calculi. Urinary bladder normal. Normal uterus which is retroverted.  Negative for bowel obstruction. No bowel thickening. Normal appendix.  Negative for free fluid.  No adenopathy identified.  Widespread bony lesions compatible with metastatic disease. These lesions are mostly sclerotic but there are some lesions which are lytic including the left posterior iliac wing. Bone lesions show rapid progression since the CT of 12/05/2014. Similar appearance on the MRI of 02/02/2015. Given history of stage I lung cancer, metastatic lung cancer is possible however also consider metastatic breast cancer. No pathologic fracture. Prior surgical fusion L5-S1 with pseudarthrosis and grade 1 anterior slip.  IMPRESSION: Common bile duct dilated 11 mm. Common duct stone not excluded. No liver lesions  Widespread bony metastatic disease, with rapid progression since 12/05/2014. Given the sclerotic appearance of these lesions, consider breast cancer is well as metastatic lung cancer.   Electronically Signed   By: Franchot Gallo M.D.   On: 02/17/2015 10:15   Dg Esophagus  02/18/2015   CLINICAL DATA:  64 year old female with history of dysphagia, complaining that solid foods get stuck for the past 5 months.  EXAM:  ESOPHOGRAM/BARIUM SWALLOW  TECHNIQUE: Combined double contrast and single contrast examination performed using effervescent crystals, thick barium liquid, and thin barium liquid.  FLUOROSCOPY TIME:  If the device does not provide the exposure index:  Fluoroscopy Time:  3 minutes and 27 seconds.  Number of Acquired Images:  39  COMPARISON:  No priors.  FINDINGS: Initial double contrast images of the esophagus demonstrated a normal appearance of the esophageal mucosa. Multiple single swallow attempts were observed, with incomplete propagation of the primary peristaltic waves on every attempt. This was of variable severity, with various degrees of proximal escape ranging from moderate to mild. No tertiary contractions were noted. Full column esophagram demonstrated no esophageal mass, stricture or esophageal ring. No hiatal hernia. No gastroesophageal reflux was observed during a water siphon test. A barium tablet was administered, which passed readily into the stomach.  IMPRESSION: 1. Nonspecific esophageal motility disorder, as above. 2. The esophagus is structurally normal.   Electronically Signed   By: Vinnie Langton M.D.   On: 02/18/2015 15:46   Ir Fluoro Guide Cv Line Right  03/09/2015   CLINICAL DATA:  METASTATIC LUNG CANCER  EXAM: RIGHT INTERNAL JUGULAR SINGLE LUMEN POWER PORT CATHETER INSERTION  Date:  4/19/20164/19/2016 3:51 pm  Radiologist:  M. Daryll Brod, MD  Guidance:  Ultrasound fluoroscopic  FLUOROSCOPY TIME:  30 seconds, 3 mGy  MEDICATIONS AND MEDICAL HISTORY: [name] already receiving vancomycin earlier today. This was reviewed with pharmacy. No further antibiotics were necessary.  2 mg Versed  ANESTHESIA/SEDATION: 28 minutes  CONTRAST:  None.  COMPLICATIONS: None immediate  PROCEDURE: Informed consent was obtained from the patient following explanation of the procedure, risks, benefits and alternatives. The patient understands, agrees and consents for the procedure. All questions were  addressed. A time out was performed.  Maximal barrier sterile technique utilized including caps, mask, sterile gowns, sterile gloves, large sterile drape, hand hygiene, and 2% chlorhexidine scrub.  Under sterile conditions and local anesthesia, right internal jugular micropuncture venous access was performed. Access was performed with ultrasound. Images were obtained for documentation. A guide wire was inserted followed by a transitional dilator. This allowed insertion of a guide wire and catheter into the IVC. Measurements were obtained from the SVC / RA junction back to the right IJ venotomy site. In the right infraclavicular chest, a subcutaneous pocket  was created over the second anterior rib. This was done under sterile conditions and local anesthesia. 1% lidocaine with epinephrine was utilized for this. A 2.5 cm incision was made in the skin. Blunt dissection was performed to create a subcutaneous pocket over the right pectoralis major muscle. The pocket was flushed with saline vigorously. There was adequate hemostasis. The port catheter was assembled and checked for leakage. The port catheter was secured in the pocket with two retention sutures. The tubing was tunneled subcutaneously to the right venotomy site and inserted into the SVC/RA junction through a valved peel-away sheath. Position was confirmed with fluoroscopy. Images were obtained for documentation. The patient tolerated the procedure well. No immediate complications. Incisions were closed in a two layer fashion with 4 - 0 Vicryl suture. Dermabond was applied to the skin. The port catheter was accessed, blood was aspirated followed by saline and heparin flushes. Needle was removed. A dry sterile dressing was applied.  IMPRESSION: Ultrasound and fluoroscopically guided right internal jugular single lumen power port catheter insertion. Tip in the SVC/RA junction. Catheter ready for use.   Electronically Signed   By: Jerilynn Mages.  Shick M.D.   On: 03/09/2015  16:23   Ir US Guide Vasc Access Right  03/09/2015   CLINICAL DATA:  METASTATIC LUNG CANCER  EXAM: RIGHT INTERNAL JUGULAR SINGLE LUMEN POWER PORT CATHETER INSERTION  Date:  4/19/20164/19/2016 3:51 pm  Radiologist:  M. Daryll Brod, MD  Guidance:  Ultrasound fluoroscopic  FLUOROSCOPY TIME:  30 seconds, 3 mGy  MEDICATIONS AND MEDICAL HISTORY: [name] already receiving vancomycin earlier today. This was reviewed with pharmacy. No further antibiotics were necessary.  2 mg Versed  ANESTHESIA/SEDATION: 28 minutes  CONTRAST:  None.  COMPLICATIONS: None immediate  PROCEDURE: Informed consent was obtained from the patient following explanation of the procedure, risks, benefits and alternatives. The patient understands, agrees and consents for the procedure. All questions were addressed. A time out was performed.  Maximal barrier sterile technique utilized including caps, mask, sterile gowns, sterile gloves, large sterile drape, hand hygiene, and 2% chlorhexidine scrub.  Under sterile conditions and local anesthesia, right internal jugular micropuncture venous access was performed. Access was performed with ultrasound. Images were obtained for documentation. A guide wire was inserted followed by a transitional dilator. This allowed insertion of a guide wire and catheter into the IVC. Measurements were obtained from the SVC / RA junction back to the right IJ venotomy site. In the right infraclavicular chest, a subcutaneous pocket was created over the second anterior rib. This was done under sterile conditions and local anesthesia. 1% lidocaine with epinephrine was utilized for this. A 2.5 cm incision was made in the skin. Blunt dissection was performed to create a subcutaneous pocket over the right pectoralis major muscle. The pocket was flushed with saline vigorously. There was adequate hemostasis. The port catheter was assembled and checked for leakage. The port catheter was secured in the pocket with two retention  sutures. The tubing was tunneled subcutaneously to the right venotomy site and inserted into the SVC/RA junction through a valved peel-away sheath. Position was confirmed with fluoroscopy. Images were obtained for documentation. The patient tolerated the procedure well. No immediate complications. Incisions were closed in a two layer fashion with 4 - 0 Vicryl suture. Dermabond was applied to the skin. The port catheter was accessed, blood was aspirated followed by saline and heparin flushes. Needle was removed. A dry sterile dressing was applied.  IMPRESSION: Ultrasound and fluoroscopically guided right internal jugular single  lumen power port catheter insertion. Tip in the SVC/RA junction. Catheter ready for use.   Electronically Signed   By: Jerilynn Mages.  Shick M.D.   On: 03/09/2015 16:23   Ct Biopsy  03/04/2015   INDICATION: History of lung cancer, now with enlarging lytic lesion within the posterior aspect of the left iliac crest worrisome for metastatic disease. Please perform CT-guided biopsy for tissue diagnostic purposes.  EXAM: CT-GUIDED BIOPSY OF LYTIC LESION WITHIN THE POSTERIOR ASPECT OF THE LEFT ILIAC CREST  COMPARISON:  CT abdomen and pelvis - 02/17/2015; 12/05/2014  MEDICATIONS: Fentanyl 100 mcg IV; Versed 2 mg IV  ANESTHESIA/SEDATION: Sedation Time  17 minutes  CONTRAST:  None  COMPLICATIONS: None immediate.  PROCEDURE: Informed consent was obtained from the patient following an explanation of the procedure, risks, benefits and alternatives. The patient understands, agrees and consents for the procedure. All questions were addressed. A time out was performed prior to the initiation of the procedure. The patient was positioned prone and non-contrast localization CT was performed of the pelvis demonstrating unchanged appearance of ill-defined approximately 1.0 x 1.3 cm lytic lesion within the posterior aspect of the left iliac crest (image 8, series 2). The operative site was prepped and draped in the usual  sterile fashion.  Under sterile conditions and local anesthesia, a 22 gauge spinal needle was utilized for procedural planning. Next, an 11 gauge coaxial bone biopsy needle was advanced into the peripheral aspect of the lytic lesion. Needle position was confirmed with CT imaging and a bone biopsy was obtained with the 11 gauge outer bone marrow device.  The 11 gauge coaxial bone biopsy needle was then readvanced into the peripheral aspect of the lytic lesion. Needle positioning was confirmed with CT imaging and 2 bone lesion biopsies were obtained with the inner 13 gauge biopsy device. Finally, the 11 gauge coaxial bone biopsy needle was utilized to obtain a final sample.  Postprocedural imaging was obtained and was negative for complication, specifically, no evidence of hematoma formation. The needle was removed intact. Hemostasis was obtained with compression and a dressing was placed. The patient tolerated the procedure well without immediate post procedural complication.  IMPRESSION: Successful CT guided left iliac bone lesion core biopsies.   Electronically Signed   By: Sandi Mariscal M.D.   On: 03/04/2015 15:18   US Abdomen Limited Ruq  02/17/2015   CLINICAL DATA:  Generalized abdominal pain  EXAM: US ABDOMEN LIMITED - RIGHT UPPER QUADRANT  COMPARISON:  None.  FINDINGS: Gallbladder:  No gallstones or wall thickening visualized. No sonographic Murphy sign noted.  Common bile duct:  Diameter: 5.2 mm  Liver:  No focal lesion identified. Within normal limits in parenchymal echogenicity.  IMPRESSION: Normal   Electronically Signed   By: Franchot Gallo M.D.   On: 02/17/2015 14:20    Microbiology: No results found for this or any previous visit (from the past 240 hour(s)).   Labs: Basic Metabolic Panel:  Recent Labs Lab 03/08/15 0914 03/09/15 0455 03/10/15 0522  NA 136 144 140  K 3.6 3.0* 3.6  CL 102 107 106  CO2 '26 26 27  '$ GLUCOSE 129* 107* 94  BUN '13 13 9  '$ CREATININE 0.54 0.44* 0.47*  CALCIUM  9.4 9.1 9.3   Liver Function Tests:  Recent Labs Lab 03/08/15 0914 03/09/15 0455  AST 24 24  ALT 12 12  ALKPHOS 272* 249*  BILITOT 0.7 0.9  PROT 7.2 7.0  ALBUMIN 4.0 3.9    Recent Labs Lab 03/08/15 0914  LIPASE 18   No results for input(s): AMMONIA in the last 168 hours. CBC:  Recent Labs Lab 03/08/15 0914 03/09/15 0455 03/10/15 0522  WBC 7.0 8.4 10.0  NEUTROABS 5.2  --   --   HGB 12.9 12.1 11.8*  HCT 40.0 37.6 36.6  MCV 89.9 90.4 89.7  PLT 184 194 200   Cardiac Enzymes: No results for input(s): CKTOTAL, CKMB, CKMBINDEX, TROPONINI in the last 168 hours. BNP: BNP (last 3 results) No results for input(s): BNP in the last 8760 hours.  ProBNP (last 3 results) No results for input(s): PROBNP in the last 8760 hours.  CBG:  Recent Labs Lab 03/11/15 1645 03/11/15 2002 03/12/15 0004 03/12/15 0422 03/12/15 0733  GLUCAP 134* 113* 91 91 110*       Signed:  Arlen Dupuis S  [facility] 03/12/2015, 10:29 AM

## 2015-03-14 ENCOUNTER — Other Ambulatory Visit: Payer: Self-pay | Admitting: Hematology

## 2015-03-15 ENCOUNTER — Telehealth: Payer: Self-pay | Admitting: Hematology

## 2015-03-15 NOTE — Telephone Encounter (Signed)
s.w. pt and advised on April appt.....pt ok and aware °

## 2015-03-16 ENCOUNTER — Ambulatory Visit (HOSPITAL_BASED_OUTPATIENT_CLINIC_OR_DEPARTMENT_OTHER): Payer: Medicare HMO | Admitting: Hematology

## 2015-03-16 ENCOUNTER — Other Ambulatory Visit: Payer: Self-pay | Admitting: Hematology

## 2015-03-16 ENCOUNTER — Ambulatory Visit
Admit: 2015-03-16 | Discharge: 2015-03-16 | Disposition: A | Discharge status: Home / Self Care | Payer: Medicare HMO | Attending: Radiation Oncology | Admitting: Radiation Oncology

## 2015-03-16 ENCOUNTER — Other Ambulatory Visit: Payer: Self-pay | Admitting: *Deleted

## 2015-03-16 ENCOUNTER — Telehealth: Payer: Self-pay | Admitting: Hematology

## 2015-03-16 ENCOUNTER — Telehealth: Payer: Self-pay | Admitting: *Deleted

## 2015-03-16 ENCOUNTER — Encounter: Payer: Self-pay | Admitting: Hematology

## 2015-03-16 VITALS — BP 142/69 | HR 70 | Temp 98.7°F | Resp 18 | Ht 64.0 in | Wt 132.4 lb

## 2015-03-16 DIAGNOSIS — C7951 Secondary malignant neoplasm of bone: Secondary | ICD-10-CM | POA: Diagnosis not present

## 2015-03-16 DIAGNOSIS — M542 Cervicalgia: Secondary | ICD-10-CM | POA: Diagnosis not present

## 2015-03-16 DIAGNOSIS — E119 Type 2 diabetes mellitus without complications: Secondary | ICD-10-CM | POA: Diagnosis not present

## 2015-03-16 DIAGNOSIS — M545 Low back pain: Secondary | ICD-10-CM | POA: Diagnosis not present

## 2015-03-16 DIAGNOSIS — I10 Essential (primary) hypertension: Secondary | ICD-10-CM | POA: Diagnosis not present

## 2015-03-16 DIAGNOSIS — Z510 Encounter for antineoplastic radiation therapy: Secondary | ICD-10-CM | POA: Diagnosis present

## 2015-03-16 DIAGNOSIS — Z794 Long term (current) use of insulin: Secondary | ICD-10-CM | POA: Diagnosis not present

## 2015-03-16 DIAGNOSIS — M25519 Pain in unspecified shoulder: Secondary | ICD-10-CM | POA: Diagnosis not present

## 2015-03-16 DIAGNOSIS — Z902 Acquired absence of lung [part of]: Secondary | ICD-10-CM | POA: Diagnosis not present

## 2015-03-16 DIAGNOSIS — C3491 Malignant neoplasm of unspecified part of right bronchus or lung: Secondary | ICD-10-CM

## 2015-03-16 DIAGNOSIS — Z79891 Long term (current) use of opiate analgesic: Secondary | ICD-10-CM | POA: Diagnosis not present

## 2015-03-16 DIAGNOSIS — C3411 Malignant neoplasm of upper lobe, right bronchus or lung: Secondary | ICD-10-CM

## 2015-03-16 DIAGNOSIS — Z7951 Long term (current) use of inhaled steroids: Secondary | ICD-10-CM | POA: Diagnosis not present

## 2015-03-16 DIAGNOSIS — Z85118 Personal history of other malignant neoplasm of bronchus and lung: Secondary | ICD-10-CM | POA: Diagnosis not present

## 2015-03-16 DIAGNOSIS — Z87891 Personal history of nicotine dependence: Secondary | ICD-10-CM | POA: Diagnosis not present

## 2015-03-16 DIAGNOSIS — G8929 Other chronic pain: Secondary | ICD-10-CM | POA: Diagnosis not present

## 2015-03-16 DIAGNOSIS — J449 Chronic obstructive pulmonary disease, unspecified: Secondary | ICD-10-CM | POA: Diagnosis not present

## 2015-03-16 MED ORDER — MORPHINE SULFATE ER 30 MG PO TBCR: 30.0000 mg | EXTENDED_RELEASE_TABLET | Freq: Two times a day (BID) | ORAL | Status: DC

## 2015-03-16 NOTE — Progress Notes (Signed)
  Radiation Oncology         [phone]) [phone] ________________________________  Name: [name] [name] MRN: [id number]  Date: 03/16/2015  DOB: 05/28/1951  SIMULATION AND TREATMENT PLANNING NOTE    ICD-9-CM ICD-10-CM   1. 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  Identity was confirmed.  All relevant records and images related to the planned course of therapy were reviewed.  The patient freely provided informed written consent to proceed with treatment after reviewing the details related to the planned course of therapy. The consent form was witnessed and verified by the simulation staff.  Then, the patient was set-up in a stable reproducible  supine position for radiation therapy.  CT images were obtained.  Surface markings were placed.  The CT images were loaded into the planning software.  Then the target and avoidance structures were contoured.  Treatment planning then occurred.  The radiation prescription was entered and confirmed.  Then, I designed and supervised the construction of a total of 1 medically necessary complex treatment devices.  I have requested : 3D Simulation  I have requested a DVH of the following structures: GTV, PTV, small bowel, bladder  I have ordered:dose calc.  PLAN:  The patient will receive 30 Gy in 10 fractions.  ________________________________  -----------------------------------  Blair Promise, PhD, MD

## 2015-03-16 NOTE — Telephone Encounter (Signed)
rec'd call from [facility], requesting a peer-to-peer review  For patient's radiology request. Dr Juliane Poot. [phone]

## 2015-03-16 NOTE — Telephone Encounter (Signed)
Per 04/26 POF pt to be seen by MD at 2:45.Marland Kitchen... KJ

## 2015-03-16 NOTE — Progress Notes (Signed)
.  [place]  Telephone:([phone]) [phone] Fax:(336) [phone]  Clinic New Consult Note   Patient Care Team: Harvie Junior, MD as PCP - General (Specialist) Kathee Delton, MD as Attending Physician (Pulmonary Disease) Laurence Spates, MD as Attending Physician (Gastroenterology) Grace Isaac, MD as Attending Physician (Cardiothoracic Surgery) 03/16/2015  CHIEF COMPLAINTS:  Metastatic lung cancer to bones    Lung cancer, right upper lobe   11/07/2012 Initial Diagnosis Lung cancer, right upper lobe   02/02/2015 Progression lumbar MRI showed diffuse bone lesions, which are highly suspicious for metastasis.   02/24/2015 Imaging CT chest, abdomen and pelvis showed diffuse bone metastasis throughout the whole spine, sternum, and some of the ribs, skull, and pelvis. brain MRI was negative for metastasis   03/04/2015 Pathology Results left iliac crest lytic bone lesion biopsy showed metastatic adenocarcinoma with abundant mucin, the morphology and IHC are consistent with her prior lung adenocarcinoma.    Hospital Admission     Metastatic primary lung cancer   02/11/2015 Initial Diagnosis Metastatic primary lung cancer     HISTORY OF PRESENTING ILLNESS:  Harvey Lingo 64 y.o. female  with multiple comorbidities including untreatedihepatitis C , stage 1 right lung cancer status post lobectomy in 2014 , who was referred to our clinic because of  her recent abnormal MRI lumbar spine findings, which is highly suspicious for metastatic cancer.  She had lumber spine infusion and screws placement in sep 2014 for her back pain, which improved after surgery but she has had some residual back pain since then but was tolerable. She reprots worseing back for the past 4-5 month, and it bacame unbearable in the past few weeks and she presented to ED on 02/02/15. Lumber MRI was done which showed diffuse bone lesions which was highly syuspecious for metastases. She also developed worsening  left leg weakness in the past 2 months, able to walk independently, and mild numbness at left foot. She has been limping lately due to the back pain, no urine or stool incontinuce. She takes oxycodone twice daily which was prescribed by her primary care physician.    She lost some of appetie, lost 15 lb in the last year, No fever or chills. (+) cough with yellow sputum, and some chest discomfort, no dyspnea on exertion.  She lives with her son, she is able to take care of herself, but not much else. She spends most of time sitting or laying down during daytime.   Her last mammogram was one years ago, last colonoscopy one years ago,  which were all normal normal per patient.   INTERIM HISTORY: Patient was discharged from hospital on 03/12/2015, after her pain was better controlled with the higher dose of oxycodone and OxyContin. He she was also seen by radiation oncologist Dr. Sondra Come, and is scheduled to start left pelvic radiation in 2 days. She has been taking oxycodone 20 mg every 4 hours since her hospital discharge, but could not fill the OxyContin prescription, which is not covered by her insurance. She says her pain most controlled with with oxycodone, the effect wears out after 3 hours, and she wakes up frequently at night to take the oxycodone. Her nausea and vomiting are much better, she is being eating and drinking well at home. Her main complaint is severe left back, pelvic and leg pain, which are chronic since her back surgery, but much worse lately. She denies any other new pain or other new symptoms.  MEDICAL HISTORY:  Past Medical  History  Diagnosis Date  . Hypertension   . Bipolar 1 disorder   . Heart murmur   . Sciatica   . Arthritis   . Anxiety   . Diverticulosis   . Chronic low back pain   . DDD (degenerative disc disease), lumbar   . Chronic shoulder pain   . Chronic neck pain   . Hepatitis B     Unclear when initially diagnosed, labs in Epic from 03/09/13  . Hepatitis C       Unclear when initially diagnosed, labs in Epic from 03/09/13  . C. difficile diarrhea     April and February 2014  . COPD (chronic obstructive pulmonary disease)   . GERD (gastroesophageal reflux disease)   . H/O hiatal hernia   . Headache(784.0)     hx  . PONV (postoperative nausea and vomiting)   . Seizures     on meds  . Diabetes mellitus   . Adenocarcinoma of lung, stage 1     SURGICAL HISTORY: Past Surgical History  Procedure Laterality Date  . Facial tumor removal Right 2000  . Brain tumor removal  09  . Brain surgery      in [place]  . Video bronchoscopy  12/04/2012    Procedure: VIDEO BRONCHOSCOPY;  Surgeon: Grace Isaac, MD;  Location: [facility] OR;  Service: Thoracic;  Laterality: N/A;  . Video assisted thoracoscopy (vats)/wedge resection  12/04/2012    Procedure: VIDEO ASSISTED THORACOSCOPY (VATS)/WEDGE RESECTION;  Surgeon: Grace Isaac, MD;  Location: [facility];  Service: Thoracic;  Laterality: Right;  . Lobectomy  12/04/2012    Procedure: LOBECTOMY;  Surgeon: Grace Isaac, MD;  Location: [facility];  Service: Thoracic;  Laterality: Right;  completion of right upper lobectomy and lymph node disection, placement of on q pump  . Tubal ligation    . Tonsillectomy    . Anterior cervical decomp/discectomy fusion N/A 03/05/2014    Procedure: ANTERIOR CERVICAL DECOMPRESSION/DISCECTOMY FUSION 2 LEVELS;  Surgeon: Sinclair Ship, MD;  Location: [facility];  Service: Orthopedics;  Laterality: N/A;  Anterior cervical decompression fusion, cervical 4-5, cervical 5-6 with instrumentation, allograft.    SOCIAL HISTORY: History   Social History  . Marital Status: Widowed    Spouse Name: N/A  . Number of Children: 5  . Years of Education: N/A   Occupational History  . n/a     patient draws [employer]   Social History Main Topics  . Smoking status: Former Smoker -- 0.25 packs/day for 10 years    Types: Cigarettes    Quit date: 07/07/2013  . Smokeless tobacco: Never Used      Comment: Smokes pk q 3 days   marajuna aug  . Alcohol Use: No     Comment: quit 78yr ago  . Drug Use: Yes    Special: Hydrocodone, Marijuana     Comment: "maybe about once a month"  . Sexual Activity: No   Other Topics Concern  . Not on file   Social History Narrative    FAMILY HISTORY: Family History  Problem Relation Age of Onset  . Hypertension Father     deceased  . Diabetes Father   . Heart disease Father   . Hyperlipidemia Father   . Hypertension Mother   . Hyperlipidemia Mother   . Diabetes Mother   . Hypertension Sister   . Hyperlipidemia Sister   . Hypertension Brother   . Hyperlipidemia Brother     ALLERGIES:  is allergic to  haldol; acetaminophen; ibuprofen; metronidazole; morphine and related; and penicillins.  MEDICATIONS:  Current Outpatient Prescriptions  Medication Sig Dispense Refill  . ALPRAZolam (XANAX) 1 MG tablet Take 0.5 mg by mouth 2 (two) times daily.     . Cyanocobalamin (VITAMIN B-12 IJ) Inject as directed every 30 (thirty) days. Administered by Dr. York Ram around the 1st of each month    . dexamethasone (DECADRON) 4 MG tablet Take 1 tablet (4 mg total) by mouth daily. 30 tablet 2  . dicyclomine (BENTYL) 20 MG tablet Take 20 mg by mouth 3 (three) times daily with meals.    . gabapentin (NEURONTIN) 400 MG capsule Take 400 mg by mouth 3 (three) times daily. Nerve pain    . hydrOXYzine (VISTARIL) 25 MG capsule Take 25 mg by mouth daily.    . Ipratropium-Albuterol (COMBIVENT RESPIMAT) 20-100 MCG/ACT AERS respimat Inhale 2 puffs into the lungs daily as needed for wheezing or shortness of breath.     Marland Kitchen KLOR-CON M20 20 MEQ tablet Take 20 mEq by mouth every morning. with food  6  . loratadine (CLARITIN) 10 MG tablet Take 10 mg by mouth every morning.     . Menthol-Methyl Salicylate (MUSCLE RUB) 10-15 % CREA Apply 1 application topically daily as needed for muscle pain. Left shoulder pain    . metFORMIN (GLUCOPHAGE) 500 MG tablet Take 500 mg  by mouth 2 (two) times daily with a meal.    . metoCLOPramide (REGLAN) 5 MG tablet Take 1 tablet (5 mg total) by mouth 3 (three) times daily before meals. 30 tablet 2  . metoprolol (LOPRESSOR) 50 MG tablet Take 1 tablet (50 mg total) by mouth 2 (two) times daily. 60 tablet 2  . olmesartan (BENICAR) 40 MG tablet Take 1 tablet (40 mg total) by mouth daily. 30 tablet 2  . omeprazole (PRILOSEC) 20 MG capsule Take 20 mg by mouth 2 (two) times daily before a meal.     . ondansetron (ZOFRAN-ODT) 8 MG disintegrating tablet Take 1 tablet by mouth every 6 (six) hours as needed for nausea or vomiting.     . OXcarbazepine (TRILEPTAL) 150 MG tablet Take 150 mg by mouth 3 (three) times daily.     Marland Kitchen oxyCODONE 10 MG TABS Take 1 tablet (10 mg total) by mouth every 4 (four) hours as needed for moderate pain. 30 tablet 0  . polyethylene glycol (MIRALAX / GLYCOLAX) packet Take 17 g by mouth 2 (two) times daily as needed for mild constipation or moderate constipation.    . promethazine (PHENERGAN) 25 MG tablet Take 1 tablet (25 mg total) by mouth every 8 (eight) hours as needed for nausea or vomiting. 15 tablet 0  . senna-docusate (SENOKOT-S) 8.6-50 MG per tablet Take 2 tablets by mouth 2 (two) times daily. 30 tablet 2  . simvastatin (ZOCOR) 20 MG tablet Take 20 mg by mouth at bedtime.     Marland Kitchen tiZANidine (ZANAFLEX) 4 MG tablet Take 4 mg by mouth every 8 (eight) hours as needed for muscle spasms.    Marland Kitchen triamcinolone cream (KENALOG) 0.1 % Apply 1 application topically 2 (two) times daily as needed (for rash).     . Vitamin D, Ergocalciferol, (DRISDOL) 50000 UNITS CAPS capsule Take 50,000 Units by mouth every Monday.     . OxyCODONE (OXYCONTIN) 40 mg T12A 12 hr tablet Take 1 tablet (40 mg total) by mouth every 8 (eight) hours. (Patient not taking: Reported on 03/16/2015) 30 tablet 0   No current facility-administered medications for  this visit.    REVIEW OF SYSTEMS:   Constitutional: Denies fevers, chills or abnormal night  sweats, (+) malaise and weight loss  Eyes: Denies blurriness of vision, double vision or watery eyes Ears, nose, mouth, throat, and face: Denies mucositis or sore throat Respiratory:(+) cpugh, no dyspnea or wheezes Cardiovascular: Denies palpitation, chest discomfort or lower extremity swelling Gastrointestinal:  Denies nausea, heartburn or change in bowel habits Skin: Denies abnormal skin rashes Lymphatics: Denies new lymphadenopathy or easy bruising Neurological:Denies numbness, tingling or new weaknesses Behavioral/Psych: Mood is stable, no new changes  All other systems were reviewed with the patient and are negative.  PHYSICAL EXAMINATION: ECOG PERFORMANCE STATUS:3   Filed Vitals:   03/16/15 1503  BP: 142/69  Pulse: 70  Temp: 98.7 F (37.1 C)  Resp: 18   Filed Weights   03/16/15 1503  Weight: 132 lb 6.4 oz (60.056 kg)    GENERAL:alert, distress due to nausea and pain. She was moaning and turning in the recliner in the clinic, persistent nausea and vomiting throughout her visit. SKIN: skin color, texture, turgor are normal, no rashes or significant lesions EYES: normal, conjunctiva are pink and non-injected, sclera clear OROPHARYNX:no exudate, no erythema and lips, buccal mucosa, and tongue normal  NECK: supple, thyroid normal size, non-tender, without nodularity LYMPH:  no palpable lymphadenopathy in the cervical, axillary or inguinal LUNGS: clear to auscultation and percussion with normal breathing effort HEART: regular rate & rhythm and no murmurs and no lower extremity edema ABDOMEN:abdomen soft, non-tender and normal bowel sounds Musculoskeletal:no cyanosis of digits and no clubbing no significant tenderness on spine  PSYCH: alert & oriented x 3 with fluent speech NEURO: no focal motor/sensory deficits   LABORATORY DATA:  I have reviewed the data as listed Lab Results  Component Value Date   WBC 10.0 03/10/2015   HGB 11.8* 03/10/2015   HCT 36.6 03/10/2015    MCV 89.7 03/10/2015   PLT 200 03/10/2015    Recent Labs  02/17/15 0749  03/08/15 0914 03/09/15 0455 03/10/15 0522  NA 138  < > 136 144 140  K 3.0*  < > 3.6 3.0* 3.6  CL 97  < > 102 107 106  CO2 27  < > _0 GLUCOSE 144*  < > 129* 107* 94  BUN 14  < > _1 CREATININE 0.65  < > 0.54 0.44* 0.47*  CALCIUM 9.7  < > 9.4 9.1 9.3  GFRNONAA >90  < > >90 >90 >90  GFRAA >90  < > >90 >90 >90  PROT 8.0  --  7.2 7.0  --   ALBUMIN 4.5  --  4.0 3.9  --   AST 26  --  24 24  --   ALT 14  --  12 12  --   ALKPHOS 198*  --  272* 249*  --   BILITOT 0.8  --  0.7 0.9  --   < > = values in this interval not displayed.   Pathology report Bone, biopsy, lytic lesion involving the left iliac crest 03/04/2015 - METASTATIC ADENOCARCINOMA WITH ABUNDANT MUCIN. Microscopic Comment The morphologic features are compatible with metastasis from the previous right upper lobe well-differentiated adenocarcinoma with abundant mucin from 12/12/2012 [phone]). ADDENDUM: At the request of Dr. Burr Medico, immunohistochemistry is performed and the tumor cells are positive with cytokeratin 7, cytokeratin 20, and CDX2. Napsin A, thyroid transcription factor-1, estrogen receptor, progesterone receptor, and gross cystic disease fluid protein are negative. The immunophenotype  is similar to the immunophenotype reported for the previous adenocarcinoma with abundant mucin from the lung.   RADIOGRAPHIC STUDIES: I have personally reviewed the radiological images as listed and agreed with the findings in the report. No new scans   ASSESSMENT & PLAN:  64 year old female, with significant history of smoking and stage I right lung adenocarcinoma status post lobectomy in 2014, no presented with was any back pain and diffuse bone lesions on the lumbar spine MRI.  1. 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 -I reviewed her bone biopsy results,  which showed metastatic adenocarcinoma, pulse morphology and immunostain are consistent with her prior lung adenocarcinoma.  -We discussed that this is incurable disease, and overall prognosis is very poor. -Her lung primary was tested negative for EGFR gene mutation and ALK translocation. Foundation one I have requested Foundation one test on her prior lung cancer tissue -she is going to start palliative left pelvic radiation for pain relief -She overall has improved, I would consider chemotherapy after palliative radiation if her overall remain stable.  2.  Left low back and pelvic pain secondary to prior lumbar surgery and bone metastasis -She has diffuse bone metastasis in spine, with the worst at T12 vertebral body,  No cold compression on scan. However she is not symptomatically from T12 lesion -She can start palliative radiation to the left pelvis for metastatic bone related pain -I discussed the pain management. She was on chronic narcotics for back pain, OxyContin is not covered by her insurance, I suggest her to try morphine extended release 30 mg every 12 hours. She had hives to IV morphine, is willing to try by mouth morphine, will watch carefully. -Continue oxycodone as needed -I'll consider Xgeva when she started systemic treatment  3.  Untreated hepatitis C,  Hypertension, diabetes, bipolar - She'll continue follow-up with her primary care physician  Plan -I give her a prescription of morphine ER 30 mg every 12 hours today -continue oxycodone 10-20 mg every 4 hours as needed -I'll see her back in 1 week to evaluate her pain control -Her use company denied PET scan, we'll call, if still not approved, will get a bone scan   All questions were answered. The patient knows to call the clinic with any problems, questions or concerns. I spent 30 minutes counseling the patient face to face. The total time spent in the appointment was 40 minutes and more than 50% was on counseling.       Truitt Merle, MD 03/16/2015 3:25 PM

## 2015-03-17 DIAGNOSIS — Z510 Encounter for antineoplastic radiation therapy: Secondary | ICD-10-CM | POA: Diagnosis not present

## 2015-03-17 NOTE — Telephone Encounter (Signed)
I called back, pt's PET scan is approved now. Please schedule it.   Truitt Merle 03/17/2015

## 2015-03-18 ENCOUNTER — Ambulatory Visit
Admission: RE | Admit: 2015-03-18 | Discharge: 2015-03-18 | Disposition: A | Discharge status: Home / Self Care | Payer: Medicare HMO | Source: Ambulatory Visit | Attending: Radiation Oncology | Admitting: Radiation Oncology

## 2015-03-18 DIAGNOSIS — C7951 Secondary malignant neoplasm of bone: Secondary | ICD-10-CM

## 2015-03-18 DIAGNOSIS — Z510 Encounter for antineoplastic radiation therapy: Secondary | ICD-10-CM | POA: Diagnosis not present

## 2015-03-18 NOTE — Progress Notes (Signed)
  Radiation Oncology         [phone] ________________________________  Name: [name] [name] MRN: [id number]  Date: 03/18/2015  DOB: [birth year]  Simulation Verification Note   Status: outpatient  NARRATIVE: The patient was brought to the treatment unit and placed in the planned treatment position. The clinical setup was verified. Then port films were obtained and uploaded to the radiation oncology medical record software.  The treatment beams were carefully compared against the planned radiation fields. The position location and shape of the radiation fields was reviewed. They targeted volume of tissue appears to be appropriately covered by the radiation beams. Organs at risk appear to be excluded as planned.  Based on my personal review, I approved the simulation verification. The patient's treatment will proceed as planned.  -----------------------------------  Blair Promise, PhD, MD

## 2015-03-19 ENCOUNTER — Other Ambulatory Visit: Payer: Self-pay | Admitting: *Deleted

## 2015-03-19 ENCOUNTER — Ambulatory Visit
Admission: RE | Admit: 2015-03-19 | Discharge: 2015-03-19 | Disposition: A | Discharge status: Home / Self Care | Payer: Medicare HMO | Source: Ambulatory Visit | Attending: Radiation Oncology | Admitting: Radiation Oncology

## 2015-03-19 ENCOUNTER — Other Ambulatory Visit: Payer: Self-pay

## 2015-03-19 ENCOUNTER — Telehealth: Payer: Self-pay | Admitting: Hematology

## 2015-03-19 ENCOUNTER — Ambulatory Visit: Payer: Self-pay | Admitting: Hematology

## 2015-03-19 DIAGNOSIS — Z510 Encounter for antineoplastic radiation therapy: Secondary | ICD-10-CM | POA: Diagnosis not present

## 2015-03-19 DIAGNOSIS — C3410 Malignant neoplasm of upper lobe, unspecified bronchus or lung: Secondary | ICD-10-CM

## 2015-03-19 DIAGNOSIS — C7951 Secondary malignant neoplasm of bone: Secondary | ICD-10-CM

## 2015-03-19 MED ORDER — OXYCODONE HCL 10 MG PO TABS: ORAL_TABLET | ORAL | Status: DC

## 2015-03-19 NOTE — Telephone Encounter (Signed)
Patient confirmed appointment 05/03

## 2015-03-19 NOTE — Progress Notes (Signed)
Pt here for patient teaching.  Pt given Radiation and You booklet and skin care instructions. Reviewed areas of pertinence such as fatigue, nausea and vomiting and skin changes . Pt able to give teach back of to pat skin and use unscented/gentle soap,avoid applying anything to skin within 4 hours of treatment. Pt demonstrated understanding and verbalizes understanding of information given and will contact nursing with any questions or concerns.  Patient also said that she feels like her port a cath is swollen.  No swelling noted on examination.  Patient stated that she thinks she needs to apply something to her port but can't remember what.  She thinks it may be a flush.  No appointments for a flush are in EPIC.  Advised her to call Dr. Ernestina Penna office to check with her nurse.

## 2015-03-22 ENCOUNTER — Ambulatory Visit
Admission: RE | Admit: 2015-03-22 | Discharge: 2015-03-22 | Disposition: A | Discharge status: Home / Self Care | Payer: Medicare HMO | Source: Ambulatory Visit | Attending: Radiation Oncology | Admitting: Radiation Oncology

## 2015-03-22 ENCOUNTER — Encounter: Payer: Self-pay | Admitting: Hematology

## 2015-03-22 DIAGNOSIS — Z510 Encounter for antineoplastic radiation therapy: Secondary | ICD-10-CM | POA: Diagnosis not present

## 2015-03-22 NOTE — Progress Notes (Signed)
Patient's daughter gave me letter of denial from [facility] for oxycodene. Her mom is allergic to morphine. I sent staff message to dr.feng to get letter of necessity so I can get appeal to [facility]

## 2015-03-23 ENCOUNTER — Other Ambulatory Visit (HOSPITAL_BASED_OUTPATIENT_CLINIC_OR_DEPARTMENT_OTHER): Payer: Medicare HMO

## 2015-03-23 ENCOUNTER — Ambulatory Visit: Payer: Medicare HMO | Admitting: Radiation Oncology

## 2015-03-23 ENCOUNTER — Telehealth: Payer: Self-pay | Admitting: Hematology

## 2015-03-23 ENCOUNTER — Encounter: Payer: Self-pay | Admitting: Hematology

## 2015-03-23 ENCOUNTER — Ambulatory Visit
Admission: RE | Admit: 2015-03-23 | Discharge: 2015-03-23 | Disposition: A | Discharge status: Home / Self Care | Payer: Medicare HMO | Source: Ambulatory Visit | Attending: Radiation Oncology | Admitting: Radiation Oncology

## 2015-03-23 ENCOUNTER — Ambulatory Visit (HOSPITAL_BASED_OUTPATIENT_CLINIC_OR_DEPARTMENT_OTHER): Payer: Medicare HMO | Admitting: Hematology

## 2015-03-23 VITALS — BP 144/80 | HR 84 | Temp 98.4°F | Resp 18 | Ht 64.0 in | Wt 131.6 lb

## 2015-03-23 DIAGNOSIS — E119 Type 2 diabetes mellitus without complications: Secondary | ICD-10-CM

## 2015-03-23 DIAGNOSIS — C3411 Malignant neoplasm of upper lobe, right bronchus or lung: Secondary | ICD-10-CM

## 2015-03-23 DIAGNOSIS — C3410 Malignant neoplasm of upper lobe, unspecified bronchus or lung: Secondary | ICD-10-CM

## 2015-03-23 DIAGNOSIS — C7951 Secondary malignant neoplasm of bone: Secondary | ICD-10-CM

## 2015-03-23 DIAGNOSIS — Z510 Encounter for antineoplastic radiation therapy: Secondary | ICD-10-CM | POA: Diagnosis not present

## 2015-03-23 LAB — PROTIME-INR
INR: 1.2 — ABNORMAL LOW (ref 2.00–3.50)
Protime: 14.4 Seconds — ABNORMAL HIGH (ref 10.6–13.4)

## 2015-03-23 LAB — COMPREHENSIVE METABOLIC PANEL (CC13)
ALBUMIN: 3.8 g/dL (ref 3.5–5.0)
ALK PHOS: 383 U/L — AB (ref 40–150)
ALT: 12 U/L (ref 0–55)
ANION GAP: 15 meq/L — AB (ref 3–11)
AST: 23 U/L (ref 5–34)
BUN: 17.6 mg/dL (ref 7.0–26.0)
CALCIUM: 9.2 mg/dL (ref 8.4–10.4)
CHLORIDE: 103 meq/L (ref 98–109)
CO2: 23 mEq/L (ref 22–29)
CREATININE: 0.8 mg/dL (ref 0.6–1.1)
EGFR: >90 mL/min/{1.73_m2} (ref >90)
Glucose: 143 mg/dl — ABNORMAL HIGH (ref 70–140)
Potassium: 4.1 mEq/L (ref 3.5–5.1)
Sodium: 141 mEq/L (ref 136–145)
TOTAL PROTEIN: 6.7 g/dL (ref 6.4–8.3)
Total Bilirubin: 0.54 mg/dL (ref 0.20–1.20)

## 2015-03-23 LAB — CBC & DIFF AND RETIC
BASO%: 0.1 % (ref 0.0–2.0)
Basophils Absolute: 0 10*3/uL (ref 0.0–0.1)
EOS%: 0.2 % (ref 0.0–7.0)
Eosinophils Absolute: 0 10*3/uL (ref 0.0–0.5)
HCT: 36.9 % (ref 34.8–46.6)
HGB: 12 g/dL (ref 11.6–15.9)
IMMATURE RETIC FRACT: 11.4 % — AB (ref 1.60–10.00)
LYMPH%: 13.2 % — ABNORMAL LOW (ref 14.0–49.7)
MCH: 29.4 pg (ref 25.1–34.0)
MCHC: 32.5 g/dL (ref 31.5–36.0)
MCV: 90.4 fL (ref 79.5–101.0)
MONO#: 0.8 10*3/uL (ref 0.1–0.9)
MONO%: 6.5 % (ref 0.0–14.0)
NEUT#: 9.9 10*3/uL — ABNORMAL HIGH (ref 1.5–6.5)
NEUT%: 80 % — ABNORMAL HIGH (ref 38.4–76.8)
Platelets: 180 10*3/uL (ref 145–400)
RBC: 4.08 10*6/uL (ref 3.70–5.45)
RDW: 14.2 % (ref 11.2–14.5)
Retic %: 2.6 % — ABNORMAL HIGH (ref 0.70–2.10)
Retic Ct Abs: 106.08 10*3/uL — ABNORMAL HIGH (ref 33.70–90.70)
WBC: 12.4 10*3/uL — ABNORMAL HIGH (ref 3.9–10.3)
lymph#: 1.6 10*3/uL (ref 0.9–3.3)

## 2015-03-23 LAB — LACTATE DEHYDROGENASE (CC13): LDH: 502 U/L — ABNORMAL HIGH (ref 125–245)

## 2015-03-23 MED ORDER — FOLIC ACID 1 MG PO TABS: 1.0000 mg | ORAL_TABLET | Freq: Every day | ORAL | Status: DC

## 2015-03-23 MED ORDER — OXYCODONE HCL ER 20 MG PO T12A: 20.0000 mg | EXTENDED_RELEASE_TABLET | Freq: Two times a day (BID) | ORAL | Status: DC

## 2015-03-23 NOTE — Telephone Encounter (Signed)
per pof to sch pt appt-sent MW email to sch trmt-tried to call pt to adv to call & r/s PET-pt message states "not taking calls" @ this time. Will clall pt again to give appt time & to get PET r/s

## 2015-03-23 NOTE — Progress Notes (Signed)
.  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   02/24/2015 Imaging CT chest, abdomen and pelvis showed diffuse bone metastasis throughout the whole spine, sternum, and some of the ribs, skull, and pelvis. brain MRI was negative for metastasis   03/04/2015 Pathology Results left iliac crest lytic bone lesion biopsy showed metastatic adenocarcinoma with abundant mucin, the morphology and IHC are consistent with her prior lung adenocarcinoma.   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 She was divided by a radiation oncologist during her stay, and her pain medication was adjusted.   03/19/2015 -  Radiation Therapy palliative radiation to left pelvis     Metastatic primary lung cancer   02/11/2015 Initial Diagnosis Metastatic primary lung cancer     HISTORY OF PRESENTING ILLNESS:  [name] [name] 64 y.o. female  with multiple comorbidities including untreatedihepatitis C , stage 1 right lung cancer status post lobectomy in 2014 , who was referred to our clinic because of  her recent abnormal MRI lumbar spine findings, which is highly suspicious for metastatic cancer.  She had lumber spine infusion and screws placement in sep 2014 for her back pain, which improved after surgery but she has had some residual back pain since then but was tolerable. She reprots  worseing back for the past 4-5 month, and it bacame unbearable in the past few weeks and she presented to ED on 02/02/15. Lumber MRI was done which showed diffuse bone lesions which was highly syuspecious for metastases. She also developed worsening left leg weakness in the past 2 months, able to walk independently, and mild numbness at left foot. She has been limping lately due to the back pain, no urine or stool incontinuce. She takes oxycodone twice daily which was prescribed by her primary care physician.    She lost some of appetie, lost 15 lb in the last year, No fever or chills. (+) cough with yellow sputum, and some chest discomfort, no dyspnea on exertion.  She lives with her son, she is able to take care of herself, but not much else. She spends most of time sitting or laying down during daytime.   Her last mammogram was one years ago, last colonoscopy one years ago,  which were all normal normal per patient.   INTERIM HISTORY: She would returns for follow-up. She has started radiation last week, and has been tolerating well. She did try extensive release morphine I given her last week, but developed significant skin itchiness, and she stopped. Her pain is overall moderate, she still takes oxycodone every 4 hours, and she needs to get up at night to take pain medication. Her moderate fatigue and low appetite remains stable, she eats reasonably well. She lives alone female, but does plan to move into her daughter's house in a month.   MEDICAL HISTORY:  Past Medical History  Diagnosis Date  . Hypertension   . Bipolar 1 disorder   .  Heart murmur   . Sciatica   . Arthritis   . Anxiety   . Diverticulosis   . Chronic low back pain   . DDD (degenerative disc disease), lumbar   . Chronic shoulder pain   . Chronic neck pain   . Hepatitis B     Unclear when initially diagnosed, labs in Epic from 03/09/13  . Hepatitis C     Unclear when initially diagnosed, labs in Epic from 03/09/13  . C.  difficile diarrhea     April and February 2014  . COPD (chronic obstructive pulmonary disease)   . GERD (gastroesophageal reflux disease)   . H/O hiatal hernia   . Headache(784.0)     hx  . PONV (postoperative nausea and vomiting)   . Seizures     on meds  . Diabetes mellitus   . Adenocarcinoma of lung, stage 1     SURGICAL HISTORY: Past Surgical History  Procedure Laterality Date  . Facial tumor removal Right 2000  . Brain tumor removal  09  . Brain surgery      in [place]  . Video bronchoscopy  12/04/2012    Procedure: VIDEO BRONCHOSCOPY;  Surgeon: Grace Isaac, MD;  Location: [facility] OR;  Service: Thoracic;  Laterality: N/A;  . Video assisted thoracoscopy (vats)/wedge resection  12/04/2012    Procedure: VIDEO ASSISTED THORACOSCOPY (VATS)/WEDGE RESECTION;  Surgeon: Grace Isaac, MD;  Location: [facility];  Service: Thoracic;  Laterality: Right;  . Lobectomy  12/04/2012    Procedure: LOBECTOMY;  Surgeon: Grace Isaac, MD;  Location: [facility];  Service: Thoracic;  Laterality: Right;  completion of right upper lobectomy and lymph node disection, placement of on q pump  . Tubal ligation    . Tonsillectomy    . Anterior cervical decomp/discectomy fusion N/A 03/05/2014    Procedure: ANTERIOR CERVICAL DECOMPRESSION/DISCECTOMY FUSION 2 LEVELS;  Surgeon: Sinclair Ship, MD;  Location: [facility];  Service: Orthopedics;  Laterality: N/A;  Anterior cervical decompression fusion, cervical 4-5, cervical 5-6 with instrumentation, allograft.    SOCIAL HISTORY: History   Social History  . Marital Status: Widowed    Spouse Name: N/A  . Number of Children: 5  . Years of Education: N/A   Occupational History  . n/a     [employer]   Social History Main Topics  . Smoking status: Former Smoker -- 0.25 packs/day for 10 years    Types: Cigarettes    Quit date: 07/07/2013  . Smokeless tobacco: Never Used     Comment: Smokes pk q 3 days   marajuna aug  . Alcohol Use: No      Comment: quit 50yr ago  . Drug Use: Yes    Special: Hydrocodone, Marijuana     Comment: "maybe about once a month"  . Sexual Activity: No   Other Topics Concern  . Not on file   Social History Narrative    FAMILY HISTORY: Family History  Problem Relation Age of Onset  . Hypertension Father     deceased  . Diabetes Father   . Heart disease Father   . Hyperlipidemia Father   . Hypertension Mother   . Hyperlipidemia Mother   . Diabetes Mother   . Hypertension Sister   . Hyperlipidemia Sister   . Hypertension Brother   . Hyperlipidemia Brother     ALLERGIES:  is allergic to haldol; acetaminophen; ibuprofen; metronidazole; morphine and related; and penicillins.  MEDICATIONS:  Current Outpatient Prescriptions  Medication  Sig Dispense Refill  . ALPRAZolam (XANAX) 1 MG tablet Take 0.5 mg by mouth 2 (two) times daily.     . Cyanocobalamin (VITAMIN B-12 IJ) Inject as directed every 30 (thirty) days. Administered by Dr. York Ram around the 1st of each month    . dexamethasone (DECADRON) 4 MG tablet Take 1 tablet (4 mg total) by mouth daily. 30 tablet 2  . dicyclomine (BENTYL) 20 MG tablet Take 20 mg by mouth 3 (three) times daily with meals.    . gabapentin (NEURONTIN) 400 MG capsule Take 400 mg by mouth 3 (three) times daily. Nerve pain    . hydrOXYzine (VISTARIL) 25 MG capsule Take 25 mg by mouth daily.    . Ipratropium-Albuterol (COMBIVENT RESPIMAT) 20-100 MCG/ACT AERS respimat Inhale 2 puffs into the lungs daily as needed for wheezing or shortness of breath.     Marland Kitchen KLOR-CON M20 20 MEQ tablet Take 20 mEq by mouth every morning. with food  6  . loratadine (CLARITIN) 10 MG tablet Take 10 mg by mouth every morning.     . Menthol-Methyl Salicylate (MUSCLE RUB) 10-15 % CREA Apply 1 application topically daily as needed for muscle pain. Left shoulder pain    . metFORMIN (GLUCOPHAGE) 500 MG tablet Take 500 mg by mouth 2 (two) times daily with a meal.    . metoCLOPramide (REGLAN)  5 MG tablet Take 1 tablet (5 mg total) by mouth 3 (three) times daily before meals. 30 tablet 2  . metoprolol (LOPRESSOR) 50 MG tablet Take 1 tablet (50 mg total) by mouth 2 (two) times daily. 60 tablet 2  . olmesartan (BENICAR) 40 MG tablet Take 1 tablet (40 mg total) by mouth daily. 30 tablet 2  . omeprazole (PRILOSEC) 20 MG capsule Take 20 mg by mouth 2 (two) times daily before a meal.     . ondansetron (ZOFRAN-ODT) 8 MG disintegrating tablet Take 1 tablet by mouth every 6 (six) hours as needed for nausea or vomiting.     . OXcarbazepine (TRILEPTAL) 150 MG tablet Take 150 mg by mouth 3 (three) times daily.     . Oxycodone HCl 10 MG TABS Take 1 tablet (10 mg total) by mouth every 4 (four) hours as needed for moderate pain. 30 tablet 0  . polyethylene glycol (MIRALAX / GLYCOLAX) packet Take 17 g by mouth 2 (two) times daily as needed for mild constipation or moderate constipation.    . promethazine (PHENERGAN) 25 MG tablet Take 1 tablet (25 mg total) by mouth every 8 (eight) hours as needed for nausea or vomiting. 15 tablet 0  . senna-docusate (SENOKOT-S) 8.6-50 MG per tablet Take 2 tablets by mouth 2 (two) times daily. 30 tablet 2  . simvastatin (ZOCOR) 20 MG tablet Take 20 mg by mouth at bedtime.     Marland Kitchen tiZANidine (ZANAFLEX) 4 MG tablet Take 4 mg by mouth every 8 (eight) hours as needed for muscle spasms.    Marland Kitchen triamcinolone cream (KENALOG) 0.1 % Apply 1 application topically 2 (two) times daily as needed (for rash).     . Vitamin D, Ergocalciferol, (DRISDOL) 50000 UNITS CAPS capsule Take 50,000 Units by mouth every Monday.      No current facility-administered medications for this visit.    REVIEW OF SYSTEMS:   Constitutional: Denies fevers, chills or abnormal night sweats, (+) malaise and weight loss  Eyes: Denies blurriness of vision, double vision or watery eyes Ears, nose, mouth, throat, and face: Denies mucositis or sore throat Respiratory:(+) cpugh,  no dyspnea or  wheezes Cardiovascular: Denies palpitation, chest discomfort or lower extremity swelling Gastrointestinal:  Denies nausea, heartburn or change in bowel habits Skin: Denies abnormal skin rashes Lymphatics: Denies new lymphadenopathy or easy bruising Neurological:Denies numbness, tingling or new weaknesses Behavioral/Psych: Mood is stable, no new changes  All other systems were reviewed with the patient and are negative.  PHYSICAL EXAMINATION: ECOG PERFORMANCE STATUS:2  Filed Vitals:   03/23/15 1320  BP: 144/80  Pulse: 84  Temp: 98.4 F (36.9 C)  Resp: 18   Filed Weights   03/23/15 1320  Weight: 131 lb 9.6 oz (59.693 kg)    GENERAL:alert, distress due to nausea and pain. She was moaning and turning in the recliner in the clinic, persistent nausea and vomiting throughout her visit. SKIN: skin color, texture, turgor are normal, no rashes or significant lesions EYES: normal, conjunctiva are pink and non-injected, sclera clear OROPHARYNX:no exudate, no erythema and lips, buccal mucosa, and tongue normal  NECK: supple, thyroid normal size, non-tender, without nodularity LYMPH:  no palpable lymphadenopathy in the cervical, axillary or inguinal LUNGS: clear to auscultation and percussion with normal breathing effort HEART: regular rate & rhythm and no murmurs and no lower extremity edema ABDOMEN:abdomen soft, non-tender and normal bowel sounds Musculoskeletal:no cyanosis of digits and no clubbing no significant tenderness on spine  PSYCH: alert & oriented x 3 with fluent speech NEURO: no focal motor/sensory deficits   LABORATORY DATA:  I have reviewed the data as listed Lab Results  Component Value Date   WBC 12.4* 03/23/2015   HGB 12.0 03/23/2015   HCT 36.9 03/23/2015   MCV 90.4 03/23/2015   PLT 180 03/23/2015    Recent Labs  03/08/15 0914 03/09/15 0455 03/10/15 0522 03/23/15 1238  NA 136 144 140 141  K 3.6 3.0* 3.6 4.1  CL 102 107 106  --   CO2 _0 GLUCOSE 129* 107* 94 143*  BUN _1 17.6  CREATININE 0.54 0.44* 0.47* 0.8  CALCIUM 9.4 9.1 9.3 9.2  GFRNONAA >90 >90 >90  --   GFRAA >90 >90 >90  --   PROT 7.2 7.0  --  6.7  ALBUMIN 4.0 3.9  --  3.8  AST 24 24  --  23  ALT 12 12  --  12  ALKPHOS 272* 249*  --  383*  BILITOT 0.7 0.9  --  0.54     Pathology report Bone, biopsy, lytic lesion involving the left iliac crest 03/04/2015 - METASTATIC ADENOCARCINOMA WITH ABUNDANT MUCIN. Microscopic Comment The morphologic features are compatible with metastasis from the previous right upper lobe well-differentiated adenocarcinoma with abundant mucin from 12/12/2012 [phone]). ADDENDUM: At the request of Dr. Burr Medico, immunohistochemistry is performed and the tumor cells are positive with cytokeratin 7, cytokeratin 20, and CDX2. Napsin A, thyroid transcription factor-1, estrogen receptor, progesterone receptor, and gross cystic disease fluid protein are negative. The immunophenotype is similar to the immunophenotype reported for the previous adenocarcinoma with abundant mucin from the lung.   RADIOGRAPHIC STUDIES: I have personally reviewed the radiological images as listed and agreed with the findings in the report. No new scans   ASSESSMENT & PLAN:  64 year old female, with significant history of smoking and stage I right lung adenocarcinoma status post lobectomy in 2014, no presented with was any back pain and diffuse bone lesions on the lumbar spine MRI.  1. 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 -I reviewed her bone biopsy results, which showed metastatic adenocarcinoma, pulse morphology and immunostain are consistent with her prior lung adenocarcinoma.  -We discussed that this is incurable disease, and overall prognosis is very poor. -Her lung primary was tested negative for EGFR gene mutation and ALK translocation. I have requested [facility] one  test on her prior lung cancer tissue -she is undergoing palliative left pelvic radiation for pain relief -She overall has improved, but we discussed the systemic therapy option with chemotherapy. Given her adenocarcinoma histology, I recommend carboplatin, pemetrexed and Avastin.  -Chemotherapy consent: Side effects including but does not not limited to, fatigue, nausea, vomiting, diarrhea, hair loss, neuropathy, fluid retention, renal and kidney dysfunction, neutropenic fever, needed for blood transfusion, bleeding, were discussed with patient in great detail. She agrees to proceed. -I tentatively will schedule her to start chemotherapy 1 week after her radiation, which is May 16. -She is being getting B12 injections at her primary care physician's office, she is due later this week. -I sent a prescription of folic acid to her pharmacy, she will start taking it once a day -She is currently on dexamethasone, I'll work with Dr. Sondra Come to tapered off.   2.  Left low back and pelvic pain secondary to prior lumbar surgery and bone metastasis -She has diffuse bone metastasis in spine, with the worst at T12 vertebral body,  No cold compression on scan. However she is not symptomatically from T12 lesion -She is on palliative radiation to the left pelvis for metastatic bone related pain -She did not tolerate morphine, with skin itchiness. -OxyContin was previously did not by her insurance, I called her insurance again today and request urgent review, hopefully it will be approved within 24 hours. I give her prescription of OxyContin 20 mg every 12 hours, a total of 60 pills. -Continue oxycodone as needed -I'll consider Xgeva when she started systemic treatment. She agrees to see her dentist and have her teeth cleaned and checked out.  3.  Untreated hepatitis C,  Hypertension, diabetes, bipolar - She'll continue follow-up with her primary care physician  Plan -I gave her a prescription of extensor  release oxycodone 20 mg every 12 hours today -continue oxycodone 10-20 mg every 4 hours as needed -Her PET scan was approved, we'll schedule it as soon as possible -Starting chemotherapy with carboplatin, pemetrexed and Avastin on May 16, I'll see her back before chemotherapy.  All questions were answered. The patient knows to call the clinic with any problems, questions or concerns. I spent 30 minutes counseling the patient face to face. The total time spent in the appointment was 40 minutes and more than 50% was on counseling.    Truitt Merle, MD 03/23/2015 3:10 PM

## 2015-03-24 ENCOUNTER — Emergency Department (HOSPITAL_BASED_OUTPATIENT_CLINIC_OR_DEPARTMENT_OTHER)
Admit: 2015-03-24 | Discharge: 2015-03-24 | Disposition: A | Discharge status: Home / Self Care | Payer: Medicare HMO | Attending: Emergency Medicine | Admitting: Emergency Medicine

## 2015-03-24 ENCOUNTER — Other Ambulatory Visit: Payer: Self-pay

## 2015-03-24 ENCOUNTER — Ambulatory Visit: Payer: Medicare HMO

## 2015-03-24 ENCOUNTER — Emergency Department (HOSPITAL_COMMUNITY): Payer: Medicare HMO

## 2015-03-24 ENCOUNTER — Ambulatory Visit: Admission: RE | Admit: 2015-03-24 | Payer: Medicare HMO | Source: Ambulatory Visit | Admitting: Radiation Oncology

## 2015-03-24 ENCOUNTER — Encounter (HOSPITAL_COMMUNITY): Payer: Self-pay | Admitting: Emergency Medicine

## 2015-03-24 ENCOUNTER — Emergency Department (HOSPITAL_COMMUNITY)
Admission: EM | Admit: 2015-03-24 | Discharge: 2015-03-24 | Disposition: A | Discharge status: Home / Self Care | Payer: Medicare HMO | Attending: Emergency Medicine | Admitting: Emergency Medicine

## 2015-03-24 ENCOUNTER — Ambulatory Visit
Admit: 2015-03-24 | Discharge: 2015-03-24 | Disposition: A | Discharge status: Home / Self Care | Payer: Medicare HMO | Attending: Radiation Oncology | Admitting: Radiation Oncology

## 2015-03-24 ENCOUNTER — Telehealth: Payer: Self-pay | Admitting: Hematology

## 2015-03-24 DIAGNOSIS — Z880 Allergy status to penicillin: Secondary | ICD-10-CM | POA: Insufficient documentation

## 2015-03-24 DIAGNOSIS — K219 Gastro-esophageal reflux disease without esophagitis: Secondary | ICD-10-CM | POA: Insufficient documentation

## 2015-03-24 DIAGNOSIS — Z85118 Personal history of other malignant neoplasm of bronchus and lung: Secondary | ICD-10-CM | POA: Diagnosis not present

## 2015-03-24 DIAGNOSIS — Z79899 Other long term (current) drug therapy: Secondary | ICD-10-CM | POA: Insufficient documentation

## 2015-03-24 DIAGNOSIS — J449 Chronic obstructive pulmonary disease, unspecified: Secondary | ICD-10-CM | POA: Diagnosis not present

## 2015-03-24 DIAGNOSIS — Z7952 Long term (current) use of systemic steroids: Secondary | ICD-10-CM | POA: Insufficient documentation

## 2015-03-24 DIAGNOSIS — Z8619 Personal history of other infectious and parasitic diseases: Secondary | ICD-10-CM | POA: Insufficient documentation

## 2015-03-24 DIAGNOSIS — R1030 Lower abdominal pain, unspecified: Secondary | ICD-10-CM | POA: Diagnosis not present

## 2015-03-24 DIAGNOSIS — I10 Essential (primary) hypertension: Secondary | ICD-10-CM | POA: Insufficient documentation

## 2015-03-24 DIAGNOSIS — E119 Type 2 diabetes mellitus without complications: Secondary | ICD-10-CM | POA: Insufficient documentation

## 2015-03-24 DIAGNOSIS — M25552 Pain in left hip: Secondary | ICD-10-CM | POA: Insufficient documentation

## 2015-03-24 DIAGNOSIS — M79609 Pain in unspecified limb: Secondary | ICD-10-CM

## 2015-03-24 DIAGNOSIS — Z8719 Personal history of other diseases of the digestive system: Secondary | ICD-10-CM | POA: Diagnosis not present

## 2015-03-24 DIAGNOSIS — R011 Cardiac murmur, unspecified: Secondary | ICD-10-CM | POA: Diagnosis not present

## 2015-03-24 DIAGNOSIS — M79651 Pain in right thigh: Secondary | ICD-10-CM

## 2015-03-24 DIAGNOSIS — L299 Pruritus, unspecified: Secondary | ICD-10-CM | POA: Diagnosis present

## 2015-03-24 DIAGNOSIS — G8929 Other chronic pain: Secondary | ICD-10-CM | POA: Diagnosis not present

## 2015-03-24 DIAGNOSIS — F319 Bipolar disorder, unspecified: Secondary | ICD-10-CM | POA: Diagnosis not present

## 2015-03-24 DIAGNOSIS — Z87891 Personal history of nicotine dependence: Secondary | ICD-10-CM | POA: Insufficient documentation

## 2015-03-24 DIAGNOSIS — M7981 Nontraumatic hematoma of soft tissue: Secondary | ICD-10-CM | POA: Diagnosis not present

## 2015-03-24 DIAGNOSIS — M79652 Pain in left thigh: Secondary | ICD-10-CM

## 2015-03-24 DIAGNOSIS — G40909 Epilepsy, unspecified, not intractable, without status epilepticus: Secondary | ICD-10-CM | POA: Diagnosis not present

## 2015-03-24 DIAGNOSIS — F419 Anxiety disorder, unspecified: Secondary | ICD-10-CM | POA: Insufficient documentation

## 2015-03-24 DIAGNOSIS — R52 Pain, unspecified: Secondary | ICD-10-CM

## 2015-03-24 DIAGNOSIS — R1084 Generalized abdominal pain: Secondary | ICD-10-CM

## 2015-03-24 LAB — COMPREHENSIVE METABOLIC PANEL
ALK PHOS: 354 U/L — AB (ref 38–126)
ALT: 12 U/L — AB (ref 14–54)
AST: 26 U/L (ref 15–41)
Albumin: 3.9 g/dL (ref 3.5–5.0)
Anion gap: 9 (ref 5–15)
BILIRUBIN TOTAL: 0.9 mg/dL (ref 0.3–1.2)
BUN: 18 mg/dL (ref 6–20)
CHLORIDE: 103 mmol/L (ref 101–111)
CO2: 28 mmol/L (ref 22–32)
Calcium: 8.9 mg/dL (ref 8.9–10.3)
Creatinine, Ser: 0.63 mg/dL (ref 0.44–1.00)
GFR calc non Af Amer: >60 mL/min (ref >60)
GLUCOSE: 123 mg/dL — AB (ref 70–99)
POTASSIUM: 4.3 mmol/L (ref 3.5–5.1)
Sodium: 140 mmol/L (ref 135–145)
Total Protein: 6.8 g/dL (ref 6.5–8.1)

## 2015-03-24 LAB — CBC WITH DIFFERENTIAL/PLATELET
Basophils Absolute: 0 10*3/uL (ref 0.0–0.1)
Basophils Relative: 0 % (ref 0–1)
Eosinophils Absolute: 0 10*3/uL (ref 0.0–0.7)
Eosinophils Relative: 0 % (ref 0–5)
HEMATOCRIT: 36.5 % (ref 36.0–46.0)
HEMOGLOBIN: 11.7 g/dL — AB (ref 12.0–15.0)
Lymphocytes Relative: 7 % — ABNORMAL LOW (ref 12–46)
Lymphs Abs: 0.8 10*3/uL (ref 0.7–4.0)
MCH: 29.2 pg (ref 26.0–34.0)
MCHC: 32.1 g/dL (ref 30.0–36.0)
MCV: 91 fL (ref 78.0–100.0)
MONO ABS: 0.4 10*3/uL (ref 0.1–1.0)
MONOS PCT: 3 % (ref 3–12)
NEUTROS ABS: 9.4 10*3/uL — AB (ref 1.7–7.7)
NEUTROS PCT: 90 % — AB (ref 43–77)
Platelets: 171 10*3/uL (ref 150–400)
RBC: 4.01 MIL/uL (ref 3.87–5.11)
RDW: 14.2 % (ref 11.5–15.5)
WBC: 10.6 10*3/uL — ABNORMAL HIGH (ref 4.0–10.5)

## 2015-03-24 LAB — URINALYSIS, ROUTINE W REFLEX MICROSCOPIC
BILIRUBIN URINE: NEGATIVE
GLUCOSE, UA: NEGATIVE mg/dL
HGB URINE DIPSTICK: NEGATIVE
Ketones, ur: NEGATIVE mg/dL
Leukocytes, UA: NEGATIVE
Nitrite: NEGATIVE
PROTEIN: NEGATIVE mg/dL
Specific Gravity, Urine: 1.013 (ref 1.005–1.030)
Urobilinogen, UA: 1 mg/dL (ref 0.0–1.0)
pH: 6.5 (ref 5.0–8.0)

## 2015-03-24 LAB — PROTIME-INR
INR: 1.3 (ref 0.00–1.49)
Prothrombin Time: 16.4 seconds — ABNORMAL HIGH (ref 11.6–15.2)

## 2015-03-24 LAB — APTT: aPTT: 29 seconds (ref 24–37)

## 2015-03-24 LAB — LIPASE, BLOOD: Lipase: 18 U/L — ABNORMAL LOW (ref 22–51)

## 2015-03-24 MED ORDER — HYDROMORPHONE HCL 2 MG PO TABS: 1.0000 mg | ORAL_TABLET | ORAL | Status: DC | PRN

## 2015-03-24 MED ORDER — ONDANSETRON HCL 4 MG/2ML IJ SOLN
4.0000 mg | Freq: Once | INTRAMUSCULAR | Status: AC
  Administered 2015-03-24: 4 mg via INTRAVENOUS
  Filled 2015-03-24: qty 2

## 2015-03-24 MED ORDER — ONDANSETRON HCL 4 MG/2ML IJ SOLN: 4.0000 mg | Freq: Once | INTRAMUSCULAR | Status: DC

## 2015-03-24 MED ORDER — HEPARIN SOD (PORK) LOCK FLUSH 100 UNIT/ML IV SOLN
500.0000 [IU] | Freq: Once | INTRAVENOUS | Status: AC
  Administered 2015-03-24: 500 [IU]
  Filled 2015-03-24: qty 5

## 2015-03-24 MED ORDER — HYDROMORPHONE HCL 1 MG/ML IJ SOLN
1.0000 mg | Freq: Once | INTRAMUSCULAR | Status: AC
Start: 2015-03-24 — End: 2015-03-24
  Administered 2015-03-24: 1 mg via INTRAVENOUS
  Filled 2015-03-24: qty 1

## 2015-03-24 MED ORDER — HYDROMORPHONE HCL 2 MG/ML IJ SOLN
2.0000 mg | Freq: Once | INTRAMUSCULAR | Status: AC
  Administered 2015-03-24: 2 mg via INTRAVENOUS
  Filled 2015-03-24: qty 1

## 2015-03-24 MED ORDER — HYDROMORPHONE HCL 1 MG/ML IJ SOLN
1.0000 mg | Freq: Once | INTRAMUSCULAR | Status: AC
  Administered 2015-03-24: 1 mg via INTRAVENOUS
  Filled 2015-03-24: qty 1

## 2015-03-24 MED ORDER — MAGNESIUM HYDROXIDE 400 MG/5ML PO SUSP: 5.0000 mL | Freq: Every day | ORAL | Status: DC | PRN

## 2015-03-24 MED ORDER — SODIUM CHLORIDE 0.9 % IV BOLUS (SEPSIS)
500.0000 mL | Freq: Once | INTRAVENOUS | Status: AC
  Administered 2015-03-24: 500 mL via INTRAVENOUS

## 2015-03-24 NOTE — ED Provider Notes (Signed)
CSN: [id number]     Arrival date & time 03/24/15  1137 History   First MD Initiated Contact with Patient 03/24/15 1201     Chief Complaint  Patient presents with  . Leg Pain  . Abdominal Pain     (Consider location/radiation/quality/duration/timing/severity/associated sxs/prior Treatment) HPI  [name] [name] is a 64 y.o. female with past medical history significant for lung cancer metastatic to bone complaining of exacerbation of her chronic abdominal pain with newly developed ecchymosis to the bilateral thighs which she noticed yesterday. Areas also pruritic. States that she is on 40 mg of OxyContin at home but this causes pruritus, has requested Dilaudid from her oncologist.  She woke up this morning drenched in sweat, did not make it to her scheduled radiation. Pain is 8/10 not relived with oral narcotics. Patient denies chest pain, shortness of breath, nausea, vomiting she reports decreased urine output with normal water intake. She denies changes in bowel habits  Onc: Burr Medico   Past Medical History  Diagnosis Date  . Hypertension   . Bipolar 1 disorder   . Heart murmur   . Sciatica   . Arthritis   . Anxiety   . Diverticulosis   . Chronic low back pain   . DDD (degenerative disc disease), lumbar   . Chronic shoulder pain   . Chronic neck pain   . Hepatitis B     Unclear when initially diagnosed, labs in Epic from 03/09/13  . Hepatitis C     Unclear when initially diagnosed, labs in Epic from 03/09/13  . C. difficile diarrhea     April and February 2014  . COPD (chronic obstructive pulmonary disease)   . GERD (gastroesophageal reflux disease)   . H/O hiatal hernia   . Headache(784.0)     hx  . PONV (postoperative nausea and vomiting)   . Seizures     on meds  . Diabetes mellitus   . Adenocarcinoma of lung, stage 1    Past Surgical History  Procedure Laterality Date  . Facial tumor removal Right 2000  . Brain tumor removal  09  . Brain surgery      in [place]  .  Video bronchoscopy  12/04/2012    Procedure: VIDEO BRONCHOSCOPY;  Surgeon: Grace Isaac, MD;  Location: [facility] OR;  Service: Thoracic;  Laterality: N/A;  . Video assisted thoracoscopy (vats)/wedge resection  12/04/2012    Procedure: VIDEO ASSISTED THORACOSCOPY (VATS)/WEDGE RESECTION;  Surgeon: Grace Isaac, MD;  Location: [facility];  Service: Thoracic;  Laterality: Right;  . Lobectomy  12/04/2012    Procedure: LOBECTOMY;  Surgeon: Grace Isaac, MD;  Location: [facility];  Service: Thoracic;  Laterality: Right;  completion of right upper lobectomy and lymph node disection, placement of on q pump  . Tubal ligation    . Tonsillectomy    . Anterior cervical decomp/discectomy fusion N/A 03/05/2014    Procedure: ANTERIOR CERVICAL DECOMPRESSION/DISCECTOMY FUSION 2 LEVELS;  Surgeon: Sinclair Ship, MD;  Location: [facility];  Service: Orthopedics;  Laterality: N/A;  Anterior cervical decompression fusion, cervical 4-5, cervical 5-6 with instrumentation, allograft.   Family History  Problem Relation Age of Onset  . Hypertension Father     deceased  . Diabetes Father   . Heart disease Father   . Hyperlipidemia Father   . Hypertension Mother   . Hyperlipidemia Mother   . Diabetes Mother   . Hypertension Sister   . Hyperlipidemia Sister   . Hypertension Brother   .  Hyperlipidemia Brother    History  Substance Use Topics  . Smoking status: Former Smoker -- 0.25 packs/day for 10 years    Types: Cigarettes    Quit date: 07/07/2013  . Smokeless tobacco: Never Used     Comment: Smokes pk q 3 days   marajuna aug  . Alcohol Use: No     Comment: quit 32yr ago   OB History    No data available     Review of Systems  10 systems reviewed and found to be negative, except as noted in the HPI.  Allergies  Haldol; Acetaminophen; Ibuprofen; Metronidazole; Morphine and related; and Penicillins  Home Medications   Prior to Admission medications   Medication Sig Start Date End Date Taking?  Authorizing Provider  ALPRAZolam (Duanne Moron 1 MG tablet Take 0.5 mg by mouth 2 (two) times daily.    Yes Historical Provider, MD  Cyanocobalamin (VITAMIN B-12 IJ) Inject as directed every 30 (thirty) days. Administered by Dr. DYork Ramaround the 1st of each month   Yes Historical Provider, MD  dexamethasone (DECADRON) 4 MG tablet Take 1 tablet (4 mg total) by mouth daily. 03/12/15  Yes GOswald Hillock MD  dicyclomine (BENTYL) 20 MG tablet Take 20 mg by mouth 3 (three) times daily with meals.   Yes Historical Provider, MD  folic acid (FOLVITE) 1 MG tablet Take 1 tablet (1 mg total) by mouth daily. 03/23/15  Yes YTruitt Merle MD  gabapentin (NEURONTIN) 400 MG capsule Take 400 mg by mouth 3 (three) times daily. Nerve pain   Yes Historical Provider, MD  hydrOXYzine (VISTARIL) 25 MG capsule Take 25 mg by mouth daily.   Yes Historical Provider, MD  Ipratropium-Albuterol (COMBIVENT RESPIMAT) 20-100 MCG/ACT AERS respimat Inhale 2 puffs into the lungs daily as needed for wheezing or shortness of breath.    Yes Historical Provider, MD  KLOR-CON M20 20 MEQ tablet Take 20 mEq by mouth every morning. with food 01/04/15  Yes Historical Provider, MD  loratadine (CLARITIN) 10 MG tablet Take 10 mg by mouth every morning.    Yes Historical Provider, MD  Menthol-Methyl Salicylate (MUSCLE RUB) 10-15 % CREA Apply 1 application topically daily as needed for muscle pain. Left shoulder pain   Yes Historical Provider, MD  metFORMIN (GLUCOPHAGE) 500 MG tablet Take 500 mg by mouth 2 (two) times daily with a meal.   Yes Historical Provider, MD  metoCLOPramide (REGLAN) 5 MG tablet Take 1 tablet (5 mg total) by mouth 3 (three) times daily before meals. 03/12/15  Yes GOswald Hillock MD  metoprolol (LOPRESSOR) 50 MG tablet Take 1 tablet (50 mg total) by mouth 2 (two) times daily. 03/12/15  Yes GOswald Hillock MD  olmesartan (BENICAR) 40 MG tablet Take 1 tablet (40 mg total) by mouth daily. 03/12/15  Yes GOswald Hillock MD  omeprazole (PRILOSEC)  20 MG capsule Take 20 mg by mouth 2 (two) times daily before a meal.  01/16/14  Yes Historical Provider, MD  ondansetron (ZOFRAN-ODT) 8 MG disintegrating tablet Take 1 tablet by mouth every 6 (six) hours as needed for nausea or vomiting.  03/02/15  Yes Historical Provider, MD  OXcarbazepine (TRILEPTAL) 150 MG tablet Take 150 mg by mouth 3 (three) times daily.  11/24/13  Yes Historical Provider, MD  OxyCODONE (OXYCONTIN) 20 mg T12A 12 hr tablet Take 1 tablet (20 mg total) by mouth every 12 (twelve) hours. 03/23/15  Yes YTruitt Merle MD  Oxycodone HCl 10 MG TABS Take 1 tablet (10  mg total) by mouth every 4 (four) hours as needed for moderate pain. 03/19/15  Yes Truitt Merle, MD  polyethylene glycol (MIRALAX / GLYCOLAX) packet Take 17 g by mouth 2 (two) times daily as needed for mild constipation or moderate constipation.   Yes Historical Provider, MD  promethazine (PHENERGAN) 25 MG tablet Take 1 tablet (25 mg total) by mouth every 8 (eight) hours as needed for nausea or vomiting. 12/05/14  Yes Christopher Lawyer, PA-C  senna-docusate (SENOKOT-S) 8.6-50 MG per tablet Take 2 tablets by mouth 2 (two) times daily. 03/12/15  Yes Oswald Hillock, MD  simvastatin (ZOCOR) 20 MG tablet Take 20 mg by mouth at bedtime.    Yes Historical Provider, MD  tiZANidine (ZANAFLEX) 4 MG tablet Take 4 mg by mouth every 8 (eight) hours as needed for muscle spasms.   Yes Historical Provider, MD  triamcinolone cream (KENALOG) 0.1 % Apply 1 application topically 2 (two) times daily as needed (for rash).  11/30/14  Yes Historical Provider, MD  Vitamin D, Ergocalciferol, (DRISDOL) 50000 UNITS CAPS capsule Take 50,000 Units by mouth every Monday.    Yes Historical Provider, MD  HYDROmorphone (DILAUDID) 2 MG tablet Take 0.5-1 tablets (1-2 mg total) by mouth every 4 (four) hours as needed for severe pain. 03/24/15   Costin Karlyne Greenspan, MD  magnesium hydroxide (MILK OF MAGNESIA) 400 MG/5ML suspension Take 5 mLs by mouth daily as needed for mild constipation.  03/24/15   Costin Karlyne Greenspan, MD   BP 160/71 mmHg  Pulse 80  Temp(Src) 98.7 F (37.1 C) (Oral)  Resp 14  SpO2 96% Physical Exam  Constitutional: She is oriented to person, place, and time. She appears well-developed and well-nourished. No distress.  HENT:  Head: Normocephalic.  Eyes: Conjunctivae and EOM are normal. Pupils are equal, round, and reactive to light.  Neck: Normal range of motion.  Cardiovascular: Normal rate, regular rhythm and intact distal pulses.   Pulmonary/Chest: Effort normal and breath sounds normal. No stridor.  Abdominal: Soft. Bowel sounds are normal. She exhibits no distension and no mass. There is no tenderness. There is no rebound and no guarding.  Musculoskeletal: Normal range of motion.  Neurological: She is alert and oriented to person, place, and time.  Skin:  Ecchymoses to bilateral anterior thighs with mild excoriation, no warmth, swelling, induration, discharge, compartments are soft.  Psychiatric: She has a normal mood and affect.  Nursing note and vitals reviewed.   ED Course  Procedures (including critical care time) Labs Review Labs Reviewed  CBC WITH DIFFERENTIAL/PLATELET - Abnormal; Notable for the following:    WBC 10.6 (*)    Hemoglobin 11.7 (*)    Neutrophils Relative % 90 (*)    Neutro Abs 9.4 (*)    Lymphocytes Relative 7 (*)    All other components within normal limits  COMPREHENSIVE METABOLIC PANEL - Abnormal; Notable for the following:    Glucose, Bld 123 (*)    ALT 12 (*)    Alkaline Phosphatase 354 (*)    All other components within normal limits  LIPASE, BLOOD - Abnormal; Notable for the following:    Lipase 18 (*)    All other components within normal limits  PROTIME-INR - Abnormal; Notable for the following:    Prothrombin Time 16.4 (*)    All other components within normal limits  URINALYSIS, ROUTINE W REFLEX MICROSCOPIC  APTT    Imaging Review Dg Hips Bilat With Pelvis 3-4 Views  03/24/2015   CLINICAL DATA:  64 year old female with bilateral hip pain since last night. No known injury. Initial encounter.  EXAM: BILATERAL HIP (WITH PELVIS) 3-4 VIEWS  COMPARISON:  CT Abdomen and Pelvis 02/17/2015.  FINDINGS: Chronic postoperative changes at the lumbosacral junction. Left unilateral transpedicular hardware re- identified. To the pelvis appears stable and intact. SI joints appear within normal limits. Femoral heads are normally located.  The proximal right femur is intact. Right hip joint space is normal for age.  The proximal left femur is intact. The left hip joint space is normal for age.  IMPRESSION: No acute osseous abnormality identified. Negative for age radiographic appearance of both hips.   Electronically Signed   By: Genevie Ann M.D.   On: 03/24/2015 15:20     EKG Interpretation None      MDM   Final diagnoses:  Pain  Bilateral thigh pain    Filed Vitals:   03/24/15 1300 03/24/15 1330 03/24/15 1400 03/24/15 1430  BP: 149/77 139/66 153/79 160/71  Pulse: 84 79 80 80  Temp:      TempSrc:      Resp: '16 15 15 14  '$ SpO2: 97% 94% 95% 96%    Medications  ondansetron (ZOFRAN) injection 4 mg (0 mg Intravenous Hold 03/24/15 1348)  sodium chloride 0.9 % bolus 500 mL (0 mLs Intravenous Stopped 03/24/15 1316)  ondansetron (ZOFRAN) injection 4 mg (4 mg Intravenous Given 03/24/15 1253)  HYDROmorphone (DILAUDID) injection 1 mg (1 mg Intravenous Given 03/24/15 1315)  HYDROmorphone (DILAUDID) injection 1 mg (1 mg Intravenous Given 03/24/15 1512)  HYDROmorphone (DILAUDID) injection 2 mg (2 mg Intravenous Given 03/24/15 1543)    [name] [name] is a pleasant 64 y.o. female presenting with lower abdominal pain, pain to left hip and bilateral anterior thigh pain associated with ecchymoses. Serial abdominal exams are benign, urinalysis is without signs of infection, lead work grossly normal mild elevation in alkaline phosphatase which is not atypical (she is undergoing radiation therapy) bilateral lower extremity  duplexes without DVT. X-ray of hip is negative. [name] specifically requested admission and I discussed the case with Dr. Marilynne Halsted who was evaluated the patient and talk to Dr. Burr Medico, Reccomds DC to home. I agree that she does not meet inpatient criteria. Serial abdominal exams remain completely benign and patient is tolerating by mouth. [name] amenable to discharge. Dr. Marilynne Halsted will write her for oral Dilaudid and stool softener.  Evaluation does not show pathology that would require ongoing emergent intervention or inpatient treatment. Pt is hemodynamically stable and mentating appropriately. Discussed findings and plan with patient/guardian, who agrees with care plan. All questions answered. Return precautions discussed and outpatient follow up given.   Current Discharge Medication List    START taking these medications   Details  HYDROmorphone (DILAUDID) 2 MG tablet Take 0.5-1 tablets (1-2 mg total) by mouth every 4 (four) hours as needed for severe pain. Qty: 30 tablet, Refills: 0    magnesium hydroxide (MILK OF MAGNESIA) 400 MG/5ML suspension Take 5 mLs by mouth daily as needed for mild constipation. Qty: 118 mL, Refills: 0             Monico Blitz, PA-C 03/24/15 1621

## 2015-03-24 NOTE — ED Notes (Signed)
Pt states when she woke up she couldn't get up because her stomach and legs hurt so badly. Pt states she began to feel nauseous and vomited once. Says over the last couple days her legs have began to bruise and get sore, but when she woke up this morning her legs look to have gotten worse over night.

## 2015-03-24 NOTE — Telephone Encounter (Signed)
per pof to sch pt appt-Melissa sch trmts-cld & spoke to pt to adv of time & date of appt-adv to get updated copy of sch in [facility] 5/5

## 2015-03-24 NOTE — ED Notes (Signed)
Awake. Verbally responsive. A/O x4. Resp even and unlabored. No audible adventitious breath sounds noted. ABC's intact. SR on monitor. IV infusing NS without difficulty.

## 2015-03-24 NOTE — ED Provider Notes (Signed)
Medical screening examination/treatment/procedure(s) were conducted as a shared visit with non-physician practitioner(s) and myself.  I personally evaluated the patient during the encounter.   EKG Interpretation None      Results for orders placed or performed during the hospital encounter of 03/24/15  CBC with Differential  Result Value Ref Range   WBC 10.6 (H) 4.0 - 10.5 K/uL   RBC 4.01 3.87 - 5.11 MIL/uL   Hemoglobin 11.7 (L) 12.0 - 15.0 g/dL   HCT 36.5 36.0 - 46.0 %   MCV 91.0 78.0 - 100.0 fL   MCH 29.2 26.0 - 34.0 pg   MCHC 32.1 30.0 - 36.0 g/dL   RDW 14.2 11.5 - 15.5 %   Platelets 171 150 - 400 K/uL   Neutrophils Relative % 90 (H) 43 - 77 %   Neutro Abs 9.4 (H) 1.7 - 7.7 K/uL   Lymphocytes Relative 7 (L) 12 - 46 %   Lymphs Abs 0.8 0.7 - 4.0 K/uL   Monocytes Relative 3 3 - 12 %   Monocytes Absolute 0.4 0.1 - 1.0 K/uL   Eosinophils Relative 0 0 - 5 %   Eosinophils Absolute 0.0 0.0 - 0.7 K/uL   Basophils Relative 0 0 - 1 %   Basophils Absolute 0.0 0.0 - 0.1 K/uL  Comprehensive metabolic panel  Result Value Ref Range   Sodium 140 135 - 145 mmol/L   Potassium 4.3 3.5 - 5.1 mmol/L   Chloride 103 101 - 111 mmol/L   CO2 28 22 - 32 mmol/L   Glucose, Bld 123 (H) 70 - 99 mg/dL   BUN 18 6 - 20 mg/dL   Creatinine, Ser 0.63 0.44 - 1.00 mg/dL   Calcium 8.9 8.9 - 10.3 mg/dL   Total Protein 6.8 6.5 - 8.1 g/dL   Albumin 3.9 3.5 - 5.0 g/dL   AST 26 15 - 41 U/L   ALT 12 (L) 14 - 54 U/L   Alkaline Phosphatase 354 (H) 38 - 126 U/L   Total Bilirubin 0.9 0.3 - 1.2 mg/dL   GFR calc non Af Amer >60 >60 mL/min   GFR calc Af Amer >60 >60 mL/min   Anion gap 9 5 - 15  Lipase, blood  Result Value Ref Range   Lipase 18 (L) 22 - 51 U/L  Urinalysis, Routine w reflex microscopic  Result Value Ref Range   Color, Urine YELLOW YELLOW   APPearance CLEAR CLEAR   Specific Gravity, Urine 1.013 1.005 - 1.030   pH 6.5 5.0 - 8.0   Glucose, UA NEGATIVE NEGATIVE mg/dL   Hgb urine dipstick NEGATIVE  NEGATIVE   Bilirubin Urine NEGATIVE NEGATIVE   Ketones, ur NEGATIVE NEGATIVE mg/dL   Protein, ur NEGATIVE NEGATIVE mg/dL   Urobilinogen, UA 1.0 0.0 - 1.0 mg/dL   Nitrite NEGATIVE NEGATIVE   Leukocytes, UA NEGATIVE NEGATIVE  Protime-INR  Result Value Ref Range   Prothrombin Time 16.4 (H) 11.6 - 15.2 seconds   INR 1.30 0.00 - 1.49  APTT  Result Value Ref Range   aPTT 29 24 - 37 seconds   Ct Chest Wo Contrast  02/24/2015   CLINICAL DATA:  Right upper lobectomy for cancer. MRI on 02/02/2015 showed diffuse osseous metastatic disease.  EXAM: CT CHEST WITHOUT CONTRAST  TECHNIQUE: Multidetector CT imaging of the chest was performed following the standard protocol without IV contrast.  COMPARISON:  Multiple exams, including 02/17/2015 and 09/10/2014  FINDINGS: Mediastinum/Nodes: Right upper paratracheal lymph node 0.8 cm on image 10, series 2,  not well shown previously. Currently no pathologic thoracic adenopathy is identified. Thyroid gland unremarkable. Aortic atherosclerotic calcification noted.  Lungs/Pleura: Postoperative findings right upper lobectomy. Emphysema. Minimal lingular scarring and minimal left lower lobe scarring anteriorly. No recurrent lung mass. No pleural mass identified.  6 mm ground-glass density nodule on image 22 of series 603 in the left lower lobe, equivocally questionably enlarging compared to 12/28/12.  Upper abdomen: Mild extrahepatic biliary dilatation, up to 9 mm, significance uncertain. This is not dissimilar to prior exams. Gastrohepatic ligament lymph node 0.9 cm in short axis, image 54 series 2. (Previously the same).  Musculoskeletal: Widespread osseous metastatic disease primarily manifesting as multifocal variable density (some sclerotic, some lytic) lesions less readily seen in the spine and sternum, although some slight rib endosteal irregularities can also be appreciated on care for review. A manubrial lucent lesion in the sternum measures 2.0 by 1.0 cm on image 49 of  series 603, and was not present at all on the prior chest CT. The spine lesions and other bony lesions are also not readily apparent on prior CT, indicating a rapid onset. An index lytic lesion in the T5 vertebral body measures 1.5 by 1.1 by 1.3 cm, and all the though the base of vertebral plexus extends towards this lesion I do not see definite extension outside of the vertebral body.  IMPRESSION: 1. No compelling new pulmonary mass is identified as a primary site for the diffuse osseous metastatic disease. However, new osseous metastatic lesions are present throughout the thoracic spine, sternum, and some of the ribs. 2. Very faint ground-glass density 5 mm pulmonary nodule in the left lower lobe, questionable change compared back through 12/28/2012. This may merit observation.   Electronically Signed   By: Van Clines M.D.   On: 02/24/2015 21:39   Mr Brain Wo Contrast  03/01/2015   CLINICAL DATA:  Osseous metastatic disease. Prior right upper lobectomy for lung cancer.  EXAM: MRI HEAD WITHOUT CONTRAST  TECHNIQUE: Multiplanar, multiecho pulse sequences of the brain and surrounding structures were obtained without intravenous contrast.  COMPARISON:  08/27/2012  FINDINGS: IV access could not be obtained despite multiple attempts by the IV team. Contrast could therefore not be administered. Coronal T2 weighted images were not performed as the patient was in pain and could not tolerate further imaging.  Bone marrow signal of the skull and visualized upper cervical spine is diffusely heterogeneous consistent with known widespread osseous metastases. A left parietal skull metastasis extends through the inner table and may involve the dura (series 9, image 34).  Sequelae of prior left frontal craniotomy are again identified. There is underlying left frontal lobe encephalomalacia with a small amount of chronic blood products present. Confluent T2 hyperintensity in the left frontal white matter in this region is  unchanged and presumably reflects gliosis. There is no evidence of acute infarct, midline shift, or extra-axial fluid collection. There is mild generalized cerebral atrophy, within normal limits for age. Scattered, small foci of T2 hyperintensity in the cerebral white matter are similar to the prior MRI and nonspecific but compatible with mild chronic small vessel ischemic disease. No parenchymal brain mass identified, however evaluation is limited by lack of IV contrast.  Orbits are unremarkable. Paranasal sinuses are clear. No significant mastoid effusion is seen. Major intracranial vascular flow voids are preserved, with the distal right vertebral artery appearing hypoplastic.  IMPRESSION: 1. 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 2. No parenchymal brain mass identified,  however evaluation for small lesions is limited by lack of IV contrast. Postcontrast brain MRI is recommended when/if IV access is obtained. 3. Unchanged left frontal encephalomalacia.   Electronically Signed   By: Logan Bores   On: 03/01/2015 17:09   Ir Fluoro Guide Cv Line Right  03/09/2015   CLINICAL DATA:  METASTATIC LUNG CANCER  EXAM: RIGHT INTERNAL JUGULAR SINGLE LUMEN POWER PORT CATHETER INSERTION  Date:  4/19/20164/19/2016 3:51 pm  Radiologist:  M. Daryll Brod, MD  Guidance:  Ultrasound fluoroscopic  FLUOROSCOPY TIME:  30 seconds, 3 mGy  MEDICATIONS AND MEDICAL HISTORY: [name] already receiving vancomycin earlier today. This was reviewed with pharmacy. No further antibiotics were necessary.  2 mg Versed  ANESTHESIA/SEDATION: 28 minutes  CONTRAST:  None.  COMPLICATIONS: None immediate  PROCEDURE: Informed consent was obtained from the patient following explanation of the procedure, risks, benefits and alternatives. The patient understands, agrees and consents for the procedure. All questions were addressed. A time out was performed.  Maximal barrier sterile technique utilized  including caps, mask, sterile gowns, sterile gloves, large sterile drape, hand hygiene, and 2% chlorhexidine scrub.  Under sterile conditions and local anesthesia, right internal jugular micropuncture venous access was performed. Access was performed with ultrasound. Images were obtained for documentation. A guide wire was inserted followed by a transitional dilator. This allowed insertion of a guide wire and catheter into the IVC. Measurements were obtained from the SVC / RA junction back to the right IJ venotomy site. In the right infraclavicular chest, a subcutaneous pocket was created over the second anterior rib. This was done under sterile conditions and local anesthesia. 1% lidocaine with epinephrine was utilized for this. A 2.5 cm incision was made in the skin. Blunt dissection was performed to create a subcutaneous pocket over the right pectoralis major muscle. The pocket was flushed with saline vigorously. There was adequate hemostasis. The port catheter was assembled and checked for leakage. The port catheter was secured in the pocket with two retention sutures. The tubing was tunneled subcutaneously to the right venotomy site and inserted into the SVC/RA junction through a valved peel-away sheath. Position was confirmed with fluoroscopy. Images were obtained for documentation. The patient tolerated the procedure well. No immediate complications. Incisions were closed in a two layer fashion with 4 - 0 Vicryl suture. Dermabond was applied to the skin. The port catheter was accessed, blood was aspirated followed by saline and heparin flushes. Needle was removed. A dry sterile dressing was applied.  IMPRESSION: Ultrasound and fluoroscopically guided right internal jugular single lumen power port catheter insertion. Tip in the SVC/RA junction. Catheter ready for use.   Electronically Signed   By: Jerilynn Mages.  Shick M.D.   On: 03/09/2015 16:23   Ir US Guide Vasc Access Right  03/09/2015   CLINICAL DATA:  METASTATIC  LUNG CANCER  EXAM: RIGHT INTERNAL JUGULAR SINGLE LUMEN POWER PORT CATHETER INSERTION  Date:  4/19/20164/19/2016 3:51 pm  Radiologist:  M. Daryll Brod, MD  Guidance:  Ultrasound fluoroscopic  FLUOROSCOPY TIME:  30 seconds, 3 mGy  MEDICATIONS AND MEDICAL HISTORY: [name] already receiving vancomycin earlier today. This was reviewed with pharmacy. No further antibiotics were necessary.  2 mg Versed  ANESTHESIA/SEDATION: 28 minutes  CONTRAST:  None.  COMPLICATIONS: None immediate  PROCEDURE: Informed consent was obtained from the patient following explanation of the procedure, risks, benefits and alternatives. The patient understands, agrees and consents for the procedure. All questions were addressed. A time out was performed.  Maximal  barrier sterile technique utilized including caps, mask, sterile gowns, sterile gloves, large sterile drape, hand hygiene, and 2% chlorhexidine scrub.  Under sterile conditions and local anesthesia, right internal jugular micropuncture venous access was performed. Access was performed with ultrasound. Images were obtained for documentation. A guide wire was inserted followed by a transitional dilator. This allowed insertion of a guide wire and catheter into the IVC. Measurements were obtained from the SVC / RA junction back to the right IJ venotomy site. In the right infraclavicular chest, a subcutaneous pocket was created over the second anterior rib. This was done under sterile conditions and local anesthesia. 1% lidocaine with epinephrine was utilized for this. A 2.5 cm incision was made in the skin. Blunt dissection was performed to create a subcutaneous pocket over the right pectoralis major muscle. The pocket was flushed with saline vigorously. There was adequate hemostasis. The port catheter was assembled and checked for leakage. The port catheter was secured in the pocket with two retention sutures. The tubing was tunneled subcutaneously to the right venotomy site and  inserted into the SVC/RA junction through a valved peel-away sheath. Position was confirmed with fluoroscopy. Images were obtained for documentation. The patient tolerated the procedure well. No immediate complications. Incisions were closed in a two layer fashion with 4 - 0 Vicryl suture. Dermabond was applied to the skin. The port catheter was accessed, blood was aspirated followed by saline and heparin flushes. Needle was removed. A dry sterile dressing was applied.  IMPRESSION: Ultrasound and fluoroscopically guided right internal jugular single lumen power port catheter insertion. Tip in the SVC/RA junction. Catheter ready for use.   Electronically Signed   By: Jerilynn Mages.  Shick M.D.   On: 03/09/2015 16:23   Ct Biopsy  03/04/2015   INDICATION: History of lung cancer, now with enlarging lytic lesion within the posterior aspect of the left iliac crest worrisome for metastatic disease. Please perform CT-guided biopsy for tissue diagnostic purposes.  EXAM: CT-GUIDED BIOPSY OF LYTIC LESION WITHIN THE POSTERIOR ASPECT OF THE LEFT ILIAC CREST  COMPARISON:  CT abdomen and pelvis - 02/17/2015; 12/05/2014  MEDICATIONS: Fentanyl 100 mcg IV; Versed 2 mg IV  ANESTHESIA/SEDATION: Sedation Time  17 minutes  CONTRAST:  None  COMPLICATIONS: None immediate.  PROCEDURE: Informed consent was obtained from the patient following an explanation of the procedure, risks, benefits and alternatives. The patient understands, agrees and consents for the procedure. All questions were addressed. A time out was performed prior to the initiation of the procedure. The patient was positioned prone and non-contrast localization CT was performed of the pelvis demonstrating unchanged appearance of ill-defined approximately 1.0 x 1.3 cm lytic lesion within the posterior aspect of the left iliac crest (image 8, series 2). The operative site was prepped and draped in the usual sterile fashion.  Under sterile conditions and local anesthesia, a 22 gauge  spinal needle was utilized for procedural planning. Next, an 11 gauge coaxial bone biopsy needle was advanced into the peripheral aspect of the lytic lesion. Needle position was confirmed with CT imaging and a bone biopsy was obtained with the 11 gauge outer bone marrow device.  The 11 gauge coaxial bone biopsy needle was then readvanced into the peripheral aspect of the lytic lesion. Needle positioning was confirmed with CT imaging and 2 bone lesion biopsies were obtained with the inner 13 gauge biopsy device. Finally, the 11 gauge coaxial bone biopsy needle was utilized to obtain a final sample.  Postprocedural imaging was obtained and was negative  for complication, specifically, no evidence of hematoma formation. The needle was removed intact. Hemostasis was obtained with compression and a dressing was placed. The patient tolerated the procedure well without immediate post procedural complication.  IMPRESSION: Successful CT guided left iliac bone lesion core biopsies.   Electronically Signed   By: Sandi Mariscal M.D.   On: 03/04/2015 15:18   Dg Hips Bilat With Pelvis 3-4 Views  03/24/2015   CLINICAL DATA:  64 year old female with bilateral hip pain since last night. No known injury. Initial encounter.  EXAM: BILATERAL HIP (WITH PELVIS) 3-4 VIEWS  COMPARISON:  CT Abdomen and Pelvis 02/17/2015.  FINDINGS: Chronic postoperative changes at the lumbosacral junction. Left unilateral transpedicular hardware re- identified. To the pelvis appears stable and intact. SI joints appear within normal limits. Femoral heads are normally located.  The proximal right femur is intact. Right hip joint space is normal for age.  The proximal left femur is intact. The left hip joint space is normal for age.  IMPRESSION: No acute osseous abnormality identified. Negative for age radiographic appearance of both hips.   Electronically Signed   By: Genevie Ann M.D.   On: 03/24/2015 15:20    Patient with history of metastatic lung cancer.  Followed by hematology oncology. With complaint of left hip pain. X-rays of that area are negative. Bruising to the anterior part of both thighs is seen to be perhaps consistent with scratching. Bilateral Doppler of the lower extremities is negative for any clots. Patient with better pain control. Patient initially was feeling like she could not go home.  So hospitalist was called for possible admission. Patient now feels more comfortable going home and following up with her hematologist oncologist. She will be discharged home. Patient alert no acute abnormalities. Labs without any significant abnormalities.   Patient lungs are clear bilaterally no hypotension. No fevers. Some pain to palpation around the left hip area with range of motion as well.  Fredia Sorrow, MD 03/24/15 [phone]

## 2015-03-24 NOTE — Progress Notes (Signed)
*  PRELIMINARY RESULTS* Vascular Ultrasound Lower extremity venous duplex has been completed.  Preliminary findings: Negative for DVT.   Landry Mellow, RDMS, RVT  03/24/2015, 3:35 PM

## 2015-03-24 NOTE — ED Notes (Signed)
Awake. Verbally responsive. A/O x4. Resp even and unlabored. No audible adventitious breath sounds noted. ABC's intact. SR on monitor. IV infusing NS at 14m/hr without difficulty.

## 2015-03-24 NOTE — Consult Note (Signed)
[facility] Medical Consultation  [place] VCB:[id number] DOB: June 06, 1951 DOA: 03/24/2015 PCP: Harvie Junior, MD   Requesting physician: Dr. Rogene Houston Date of consultation: 03/24/15 Reason for consultation: admission for pain control  HPI:  64 yo F with metastatic lung cancer presented to the ED with complaints of abdominal pain, hip and leg pain. This is chronic and she feels like her home pain medications are not working. She also has noticed couple of bruises on her thighs. Patient denies any fever or chills, she denies any nausea or vomiting. She is able to tolerate her oral pills at home as well as able to eat. She denies any chest pain or shortness of breath. She denies any diarrhea. She has history of multiple ED visits for similar symptoms, she underwent extensive imaging with CT scan of the abdomen and pelvis in January as well as March, has known bone metastatic disease however no acute intra-abdominal findings to explain her chronic pain. In the ED, she has normal vital signs, she is not tachycardic, her blood work is essentially normal with normal renal function, she has alkaline phosphatase elevation which is not new. She has mild anemia with a hemoglobin of 11.7. TRH was asked for admission for pain control.  Review of Systems:  As per history of present illness, otherwise 10 point review of system negative  Impression/Recommendations  1. Abdominal pain / bilateral hip pain - this is chronic, patient is nontoxic appearing in the ED, she states that she wants Dilaudid because her home medications are not working. She is eating well, she has no nausea or vomiting, she is able to tolerate food as well as her home medications. She appears stable, her blood work is fairly unremarkable other than elevated alkaline phosphatase which is likely due to her bone metastasis, and is not new. She had a Doppler ultrasound of her lower extremities because of the thigh pains and is  negative for DVT. I discussed with her primary oncologist Dr. Burr Medico over the phone. I don't see hard evidence for the patient to require inpatient hospitalization. I discussed with the ED physician, I discussed with the patient, all in agreement, will prescribe patient oral Dilaudid and she needs follow-up with her regular doctor. 2. Metastatic lung cancer - outpatient follow-up   Past Medical History  Diagnosis Date  . Hypertension   . Bipolar 1 disorder   . Heart murmur   . Sciatica   . Arthritis   . Anxiety   . Diverticulosis   . Chronic low back pain   . DDD (degenerative disc disease), lumbar   . Chronic shoulder pain   . Chronic neck pain   . Hepatitis B     Unclear when initially diagnosed, labs in Epic from 03/09/13  . Hepatitis C     Unclear when initially diagnosed, labs in Epic from 03/09/13  . C. difficile diarrhea     April and February 2014  . COPD (chronic obstructive pulmonary disease)   . GERD (gastroesophageal reflux disease)   . H/O hiatal hernia   . Headache(784.0)     hx  . PONV (postoperative nausea and vomiting)   . Seizures     on meds  . Diabetes mellitus   . Adenocarcinoma of lung, stage 1    Past Surgical History  Procedure Laterality Date  . Facial tumor removal Right 2000  . Brain tumor removal  09  . Brain surgery      in [place]  . Video  bronchoscopy  12/04/2012    Procedure: VIDEO BRONCHOSCOPY;  Surgeon: Grace Isaac, MD;  Location: [facility] OR;  Service: Thoracic;  Laterality: N/A;  . Video assisted thoracoscopy (vats)/wedge resection  12/04/2012    Procedure: VIDEO ASSISTED THORACOSCOPY (VATS)/WEDGE RESECTION;  Surgeon: Grace Isaac, MD;  Location: [place];  Service: Thoracic;  Laterality: Right;  . Lobectomy  12/04/2012    Procedure: LOBECTOMY;  Surgeon: Grace Isaac, MD;  Location: [place];  Service: Thoracic;  Laterality: Right;  completion of right upper lobectomy and lymph node disection, placement of on q pump  . Tubal ligation     . Tonsillectomy    . Anterior cervical decomp/discectomy fusion N/A 03/05/2014    Procedure: ANTERIOR CERVICAL DECOMPRESSION/DISCECTOMY FUSION 2 LEVELS;  Surgeon: Sinclair Ship, MD;  Location: [place];  Service: Orthopedics;  Laterality: N/A;  Anterior cervical decompression fusion, cervical 4-5, cervical 5-6 with instrumentation, allograft.   Social History:  reports that she quit smoking about 20 months ago. Her smoking use included Cigarettes. She has a 2.5 pack-year smoking history. She has never used smokeless tobacco. She reports that she uses illicit drugs (Hydrocodone and Marijuana). She reports that she does not drink alcohol.  Allergies  Allergen Reactions  . Haldol [Haloperidol Decanoate] Other (See Comments)    tongue swelling  . Acetaminophen Other (See Comments)    Stomach pain  . Ibuprofen Other (See Comments)    Stomach pain  . Metronidazole Other (See Comments)    Severe stomach upset--was instructed to avoid Flagyl.  . Morphine And Related Hives    Tolerates hydromorphone.  Marland Kitchen Penicillins Rash   Family History  Problem Relation Age of Onset  . Hypertension Father     deceased  . Diabetes Father   . Heart disease Father   . Hyperlipidemia Father   . Hypertension Mother   . Hyperlipidemia Mother   . Diabetes Mother   . Hypertension Sister   . Hyperlipidemia Sister   . Hypertension Brother   . Hyperlipidemia Brother     Prior to Admission medications   Medication Sig Start Date End Date Taking? Authorizing Provider  ALPRAZolam Duanne Moron) 1 MG tablet Take 0.5 mg by mouth 2 (two) times daily.    Yes Historical Provider, MD  Cyanocobalamin (VITAMIN B-12 IJ) Inject as directed every 30 (thirty) days. Administered by Dr. York Ram around the 1st of each month   Yes Historical Provider, MD  dexamethasone (DECADRON) 4 MG tablet Take 1 tablet (4 mg total) by mouth daily. 03/12/15  Yes Oswald Hillock, MD  dicyclomine (BENTYL) 20 MG tablet Take 20 mg by mouth 3  (three) times daily with meals.   Yes Historical Provider, MD  folic acid (FOLVITE) 1 MG tablet Take 1 tablet (1 mg total) by mouth daily. 03/23/15  Yes Truitt Merle, MD  gabapentin (NEURONTIN) 400 MG capsule Take 400 mg by mouth 3 (three) times daily. Nerve pain   Yes Historical Provider, MD  hydrOXYzine (VISTARIL) 25 MG capsule Take 25 mg by mouth daily.   Yes Historical Provider, MD  Ipratropium-Albuterol (COMBIVENT RESPIMAT) 20-100 MCG/ACT AERS respimat Inhale 2 puffs into the lungs daily as needed for wheezing or shortness of breath.    Yes Historical Provider, MD  KLOR-CON M20 20 MEQ tablet Take 20 mEq by mouth every morning. with food 01/04/15  Yes Historical Provider, MD  loratadine (CLARITIN) 10 MG tablet Take 10 mg by mouth every morning.    Yes Historical Provider, MD  Menthol-Methyl  Salicylate (MUSCLE RUB) 10-15 % CREA Apply 1 application topically daily as needed for muscle pain. Left shoulder pain   Yes Historical Provider, MD  metFORMIN (GLUCOPHAGE) 500 MG tablet Take 500 mg by mouth 2 (two) times daily with a meal.   Yes Historical Provider, MD  metoCLOPramide (REGLAN) 5 MG tablet Take 1 tablet (5 mg total) by mouth 3 (three) times daily before meals. 03/12/15  Yes Oswald Hillock, MD  metoprolol (LOPRESSOR) 50 MG tablet Take 1 tablet (50 mg total) by mouth 2 (two) times daily. 03/12/15  Yes Oswald Hillock, MD  olmesartan (BENICAR) 40 MG tablet Take 1 tablet (40 mg total) by mouth daily. 03/12/15  Yes Oswald Hillock, MD  omeprazole (PRILOSEC) 20 MG capsule Take 20 mg by mouth 2 (two) times daily before a meal.  01/16/14  Yes Historical Provider, MD  ondansetron (ZOFRAN-ODT) 8 MG disintegrating tablet Take 1 tablet by mouth every 6 (six) hours as needed for nausea or vomiting.  03/02/15  Yes Historical Provider, MD  OXcarbazepine (TRILEPTAL) 150 MG tablet Take 150 mg by mouth 3 (three) times daily.  11/24/13  Yes Historical Provider, MD  OxyCODONE (OXYCONTIN) 20 mg T12A 12 hr tablet Take 1 tablet (20 mg  total) by mouth every 12 (twelve) hours. 03/23/15  Yes Truitt Merle, MD  Oxycodone HCl 10 MG TABS Take 1 tablet (10 mg total) by mouth every 4 (four) hours as needed for moderate pain. 03/19/15  Yes Truitt Merle, MD  polyethylene glycol (MIRALAX / GLYCOLAX) packet Take 17 g by mouth 2 (two) times daily as needed for mild constipation or moderate constipation.   Yes Historical Provider, MD  promethazine (PHENERGAN) 25 MG tablet Take 1 tablet (25 mg total) by mouth every 8 (eight) hours as needed for nausea or vomiting. 12/05/14  Yes Christopher Lawyer, PA-C  senna-docusate (SENOKOT-S) 8.6-50 MG per tablet Take 2 tablets by mouth 2 (two) times daily. 03/12/15  Yes Oswald Hillock, MD  simvastatin (ZOCOR) 20 MG tablet Take 20 mg by mouth at bedtime.    Yes Historical Provider, MD  tiZANidine (ZANAFLEX) 4 MG tablet Take 4 mg by mouth every 8 (eight) hours as needed for muscle spasms.   Yes Historical Provider, MD  triamcinolone cream (KENALOG) 0.1 % Apply 1 application topically 2 (two) times daily as needed (for rash).  11/30/14  Yes Historical Provider, MD  Vitamin D, Ergocalciferol, (DRISDOL) 50000 UNITS CAPS capsule Take 50,000 Units by mouth every Monday.    Yes Historical Provider, MD  HYDROmorphone (DILAUDID) 2 MG tablet Take 0.5-1 tablets (1-2 mg total) by mouth every 4 (four) hours as needed for severe pain. 03/24/15   Costin Karlyne Greenspan, MD  magnesium hydroxide (MILK OF MAGNESIA) 400 MG/5ML suspension Take 5 mLs by mouth daily as needed for mild constipation. 03/24/15   Costin Karlyne Greenspan, MD   Physical Exam: Blood pressure 160/71, pulse 80, temperature 98.7 F (37.1 C), temperature source Oral, resp. rate 14, SpO2 96 %. Filed Vitals:   03/24/15 1430  BP: 160/71  Pulse: 80  Temp:   Resp: 14    General:  NAD  Eyes: no scleral icterus   Neck: no JVD  Cardiovascular: RRR without MRG, no edema   Respiratory: CTA biL  Abdomen: soft, non tender  Skin: 2 x 2 cm areas of bruising bilateral thighs.    Musculoskeletal: no clubbing/cyanosis   Psychiatric: normal mood and affect  Neurologic: non focal   Labs on Admission:  Basic Metabolic  Panel:  Recent Labs Lab 03/23/15 1238 03/24/15 1237  NA 141 140  K 4.1 4.3  CL  --  103  CO2 23 28  GLUCOSE 143* 123*  BUN 17.6 18  CREATININE 0.8 0.63  CALCIUM 9.2 8.9   Liver Function Tests:  Recent Labs Lab 03/23/15 1238 03/24/15 1237  AST 23 26  ALT 12 12*  ALKPHOS 383* 354*  BILITOT 0.54 0.9  PROT 6.7 6.8  ALBUMIN 3.8 3.9    Recent Labs Lab 03/24/15 1237  LIPASE 18*   No results for input(s): AMMONIA in the last 168 hours. CBC:  Recent Labs Lab 03/23/15 1237 03/24/15 1237  WBC 12.4* 10.6*  NEUTROABS 9.9* 9.4*  HGB 12.0 11.7*  HCT 36.9 36.5  MCV 90.4 91.0  PLT 180 171   Cardiac Enzymes: No results for input(s): CKTOTAL, CKMB, CKMBINDEX, TROPONINI in the last 168 hours. BNP: Invalid input(s): POCBNP CBG: No results for input(s): GLUCAP in the last 168 hours.  Radiological Exams on Admission: Dg Hips Bilat With Pelvis 3-4 Views  03/24/2015   CLINICAL DATA:  64 year old female with bilateral hip pain since last night. No known injury. Initial encounter.  EXAM: BILATERAL HIP (WITH PELVIS) 3-4 VIEWS  COMPARISON:  CT Abdomen and Pelvis 02/17/2015.  FINDINGS: Chronic postoperative changes at the lumbosacral junction. Left unilateral transpedicular hardware re- identified. To the pelvis appears stable and intact. SI joints appear within normal limits. Femoral heads are normally located.  The proximal right femur is intact. Right hip joint space is normal for age.  The proximal left femur is intact. The left hip joint space is normal for age.  IMPRESSION: No acute osseous abnormality identified. Negative for age radiographic appearance of both hips.   Electronically Signed   By: Genevie Ann M.D.   On: 03/24/2015 15:20   Time spent: 40 minutes  Marzetta Board [facility] Pager [phone]  If 7PM-7AM, please  contact night-coverage [email or web address] Password [email or web address] 03/24/2015, 4:15 PM

## 2015-03-24 NOTE — Discharge Instructions (Signed)
Please follow with your primary care doctor in the next 2 days for a check-up. They must obtain records for further management.  ° °Do not hesitate to return to the Emergency Department for any new, worsening or concerning symptoms.  ° °

## 2015-03-25 ENCOUNTER — Encounter: Payer: Self-pay | Admitting: Hematology

## 2015-03-25 ENCOUNTER — Ambulatory Visit: Payer: Medicare HMO | Attending: Radiation Oncology | Admitting: Radiation Oncology

## 2015-03-25 ENCOUNTER — Telehealth: Payer: Self-pay | Admitting: Oncology

## 2015-03-25 ENCOUNTER — Ambulatory Visit
Admission: RE | Admit: 2015-03-25 | Discharge: 2015-03-25 | Disposition: A | Discharge status: Home / Self Care | Payer: Medicare HMO | Source: Ambulatory Visit | Attending: Radiation Oncology | Admitting: Radiation Oncology

## 2015-03-25 ENCOUNTER — Ambulatory Visit: Payer: Medicare HMO

## 2015-03-25 NOTE — Telephone Encounter (Signed)
Called [name] [name] to see how she is feeling.  She said that she is having pain in her legs today and is having to use her walker.  She said she was not able to get her long acting oxycodone due to insurance issues.  She said she is going to have to take the morphine which caused her to itch.  She did received a prescription for dilaudid 2 mg in the ER yesterday but has not tried to fill it due to the insurance issues with the oxycodone.  Advised her to try to fill it.  Also advised her to come in tomorrow for her radiation treatment and to see the doctor.  Taralee verbalized agreement.

## 2015-03-26 ENCOUNTER — Ambulatory Visit
Admission: RE | Admit: 2015-03-26 | Discharge: 2015-03-26 | Disposition: A | Discharge status: Home / Self Care | Payer: Medicare HMO | Source: Ambulatory Visit | Attending: Radiation Oncology | Admitting: Radiation Oncology

## 2015-03-26 ENCOUNTER — Encounter: Payer: Self-pay | Admitting: Radiation Oncology

## 2015-03-26 ENCOUNTER — Other Ambulatory Visit: Payer: Self-pay

## 2015-03-26 ENCOUNTER — Other Ambulatory Visit (HOSPITAL_COMMUNITY)
Admission: RE | Admit: 2015-03-26 | Discharge: 2015-03-26 | Disposition: A | Discharge status: Home / Self Care | Payer: Medicare HMO | Source: Ambulatory Visit | Attending: Hematology | Admitting: Hematology

## 2015-03-26 VITALS — BP 134/82 | HR 85 | Temp 98.5°F | Resp 16 | Ht 64.0 in | Wt 133.1 lb

## 2015-03-26 DIAGNOSIS — C3411 Malignant neoplasm of upper lobe, right bronchus or lung: Secondary | ICD-10-CM | POA: Insufficient documentation

## 2015-03-26 DIAGNOSIS — Z510 Encounter for antineoplastic radiation therapy: Secondary | ICD-10-CM | POA: Diagnosis not present

## 2015-03-26 DIAGNOSIS — C7951 Secondary malignant neoplasm of bone: Secondary | ICD-10-CM | POA: Insufficient documentation

## 2015-03-26 DIAGNOSIS — K2970 Gastritis, unspecified, without bleeding: Secondary | ICD-10-CM

## 2015-03-26 DIAGNOSIS — B354 Tinea corporis: Secondary | ICD-10-CM

## 2015-03-26 DIAGNOSIS — K2990 Gastroduodenitis, unspecified, without bleeding: Secondary | ICD-10-CM

## 2015-03-26 MED ORDER — OMEPRAZOLE 20 MG PO CPDR: 20.0000 mg | DELAYED_RELEASE_CAPSULE | Freq: Two times a day (BID) | ORAL | Status: AC

## 2015-03-26 MED ORDER — FLUCONAZOLE 100 MG PO TABS: 100.0000 mg | ORAL_TABLET | Freq: Every day | ORAL | Status: DC

## 2015-03-26 NOTE — Progress Notes (Signed)
  Radiation Oncology         ([phone]) [phone] ________________________________  Name: [name] [name] MRN: [id number]  Date: 03/26/2015  DOB: 19-May-1951    Weekly Radiation Therapy Management    ICD-9-CM ICD-10-CM   1. Metastatic cancer to bone 198.5 C79.51     Current Dose: 15 Gy     Planned Dose:  30 Gy  Narrative . . . . . . . . The patient presents for routine under treatment assessment. Addaleigh Nicholls has completed 5 fractions to her left pelvis. She reports pain at an 8/10 in her left lower back. She continues to take Morphine 30 mg q 12 hours and oxycodone 10 mg in between twice a day. The morphine continues to make her itch and she is taking benadryl which helps a little. She is waiting for her insurance to approve the long acting oxycodone. She reports the pain is a little better in her left hip. She reports muscle tightness in the morning and trouble getting out of bed. She reports it gets better as the day goes by. She is taking decadron 4 mg once daily. She reports constipation with her last bm 4-5 days ago. She is taking Senna S and Miralax. She is passing gas. She went to the ER on 03/23/14 for nausea and bruising on her legs. She does have bruises on her right upper thigh area and a few bruises on her left leg. She was negative for DVT's. She continues to have urinary urgency. She reports a poor appetite and energy level.  C/O upset stomach and pain in the legs from red spots on the legs.                           Set-up films were reviewed. The chart was checked.  Physical Findings. . .  height is '5\' 4"'$  (1.626 m) and weight is 133 lb 1.6 oz (60.374 kg). Her oral temperature is 98.5 F (36.9 C). Her blood pressure is 134/82 and her pulse is 85. Her respiration is 16 and oxygen saturation is 100%. . Weight essentially stable.  No significant changes.  Impression . . . . . . . The patient is tolerating radiation.  Plan . . . . . . . . . . . . Continue treatment as  planned. Order for Diflucan twice a day x 14 days. Instructed not to take cholesterol medication during diflucan.  This document serves as a record of services personally performed by Tyler Pita, MD. It was created on his behalf by Jeralene Peters, a trained medical scribe. The creation of this record is based on the scribe's personal observations and the provider's statements to them. This document has been checked and approved by the attending provider.       ________________________________  Sheral Apley. Tammi Klippel, M.D.

## 2015-03-26 NOTE — Progress Notes (Signed)
[name] [name] has completed 5 fractions to her left pelvis.  She reports pain at an 8/10 in her left lower back.  She continues to take Morphine 30 mg q 12 hours and oxycodone 10 mg in between twice a day.  The morphine continues to make her itch and she is taking benadryl which helps a little.  She is waiting for her insurance to approve the long acting oxycodone.  She reports the pain is a little better in her left hip.  She reports muscle tightness in the morning and trouble getting out of bed.  She reports it gets better as the day goes by.  She is taking decadron 4 mg once daily.  She reports constipation with her last bm 4-5 days ago.  She is taking Senna S and Miralax.  She is passing gas.  She went to the ER on 03/23/14 for nausea and bruising on her legs.  She does have bruises on her right upper thigh area and a few bruises on her left leg.  She was negative for DVT's. She continues to have urinary urgency.  She reports a poor appetite and energy level.  BP 134/82 mmHg  Pulse 85  Temp(Src) 98.5 F (36.9 C) (Oral)  Resp 16  Ht '5\' 4"'$  (1.626 m)  Wt 133 lb 1.6 oz (60.374 kg)  BMI 22.84 kg/m2  SpO2 100%

## 2015-03-29 ENCOUNTER — Ambulatory Visit: Payer: Medicare HMO

## 2015-03-29 ENCOUNTER — Ambulatory Visit
Admission: RE | Admit: 2015-03-29 | Discharge: 2015-03-29 | Disposition: A | Discharge status: Home / Self Care | Payer: Medicare HMO | Source: Ambulatory Visit | Attending: Radiation Oncology | Admitting: Radiation Oncology

## 2015-03-29 ENCOUNTER — Encounter: Payer: Self-pay | Admitting: Hematology

## 2015-03-29 NOTE — Progress Notes (Signed)
I faxed letter from dr. Burr Medico for appeal for denial of oxycodone hcl er 20 mg tablet.   [phone]

## 2015-03-30 ENCOUNTER — Other Ambulatory Visit: Payer: Self-pay

## 2015-03-30 ENCOUNTER — Other Ambulatory Visit: Payer: Self-pay | Admitting: *Deleted

## 2015-03-30 ENCOUNTER — Telehealth: Payer: Self-pay | Admitting: *Deleted

## 2015-03-30 ENCOUNTER — Ambulatory Visit
Admission: RE | Admit: 2015-03-30 | Discharge: 2015-03-30 | Disposition: A | Discharge status: Home / Self Care | Payer: Medicare HMO | Source: Ambulatory Visit | Attending: Radiation Oncology | Admitting: Radiation Oncology

## 2015-03-30 ENCOUNTER — Encounter: Payer: Self-pay | Admitting: Hematology

## 2015-03-30 ENCOUNTER — Ambulatory Visit: Payer: Self-pay

## 2015-03-30 ENCOUNTER — Encounter: Payer: Self-pay | Admitting: Radiation Oncology

## 2015-03-30 ENCOUNTER — Encounter: Payer: Self-pay | Admitting: *Deleted

## 2015-03-30 VITALS — BP 107/64 | HR 98 | Temp 99.5°F | Resp 20 | Ht 64.0 in | Wt 129.5 lb

## 2015-03-30 DIAGNOSIS — C7951 Secondary malignant neoplasm of bone: Secondary | ICD-10-CM

## 2015-03-30 NOTE — Progress Notes (Signed)
  Radiation Oncology         [phone] ________________________________  Name: [name] [name] MRN: [id number]  Date: 03/30/2015  DOB: 02/20/1951  Weekly Radiation Therapy Management  DIAGNOSIS: Metastatic adenocarcinoma to bone likely lung primary  Current Dose: 18 Gy     Planned Dose:  30 Gy  Narrative . . . . . . . . The patient presents for routine under treatment assessment.                                   The patient continues to have a lot of pain in the left hip. This is unchanged in intensity thus far.                                 Set-up films were reviewed.                                 The chart was checked. Physical Findings. . .  height is '5\' 4"'$  (1.626 m) and weight is 129 lb 8 oz (58.741 kg). Her oral temperature is 99.5 F (37.5 C). Her blood pressure is 107/64 and her pulse is 98. Her respiration is 20 and oxygen saturation is 98%. . The lungs are clear. The heart has regular rhythm and rate. Lower motor strength is 5 out of 5 in the proximal muscle groups. Patient does have bruising along both lower extremities Impression . . . . . . . The patient is tolerating radiation. Plan . . . . . . . . . . . . Continue treatment as planned.  ________________________________   Blair Promise, PhD, MD

## 2015-03-30 NOTE — Telephone Encounter (Signed)
Received message from [place], care management re: [employer] approved Oxycodone from 03/29/15 through 11/20/15.   Spoke with pt at home and was informed that pt did not know where the Oxycontin prescription written on 03/24/15 might be.   Pt thought her daughter might have the prescription.  Instructed pt to bring the old prescription to exchange for a new prescription when pt comes in for radiation on 03/31/15.   Dr. Burr Medico made aware.

## 2015-03-30 NOTE — Progress Notes (Signed)
[name] [name] has completed 6 fractions to her left pelvis.  She reports pain in her entire left leg that she is rating at a 8/10.  She reports that she received a call from [facility] that her long acting oxycodone filled today.  She has been taking morphine 30 mg q 12 hours and dilaudid 2 mg every 3.5 hours last night.  She reports the morphine makes her itch.  She has also been taking oxycodone 10 mg 1-2 times a day.  She is currently taking decadron 4 mg daily.  She reports having trouble going up and down stairs.  She has lost 4 lbs since 03/26/15.  Orthostatic vitals taken: bp sitting 104/54, hr 99, bp standing 107/64, hr 98.  Her temperature was also elevated at 99.5 degrees.  BP 104/54 mmHg  Pulse 99  Temp(Src) 99.5 F (37.5 C) (Oral)  Resp 20  Ht '5\' 4"'$  (1.626 m)  Wt 129 lb 8 oz (58.741 kg)  BMI 22.22 kg/m2  SpO2 98%

## 2015-03-30 NOTE — Progress Notes (Signed)
Per [employer] oxycodone hcl er 20 mg tablet has been approved. Ref# [id number]. I sent copy of letter to medical records.

## 2015-03-31 ENCOUNTER — Ambulatory Visit
Admission: RE | Admit: 2015-03-31 | Discharge: 2015-03-31 | Disposition: A | Discharge status: Home / Self Care | Payer: Medicare HMO | Source: Ambulatory Visit | Attending: Radiation Oncology | Admitting: Radiation Oncology

## 2015-03-31 ENCOUNTER — Ambulatory Visit: Payer: Medicare HMO

## 2015-03-31 ENCOUNTER — Encounter (HOSPITAL_COMMUNITY): Payer: Self-pay

## 2015-04-01 ENCOUNTER — Encounter: Payer: Self-pay | Admitting: Nutrition

## 2015-04-01 ENCOUNTER — Ambulatory Visit: Payer: Medicare HMO

## 2015-04-01 ENCOUNTER — Ambulatory Visit (HOSPITAL_COMMUNITY)
Admission: RE | Admit: 2015-04-01 | Discharge: 2015-04-01 | Disposition: A | Discharge status: Home / Self Care | Payer: Medicare HMO | Source: Ambulatory Visit | Attending: Hematology | Admitting: Hematology

## 2015-04-01 ENCOUNTER — Other Ambulatory Visit: Payer: Self-pay | Admitting: Hematology

## 2015-04-01 DIAGNOSIS — E1143 Type 2 diabetes mellitus with diabetic autonomic (poly)neuropathy: Secondary | ICD-10-CM | POA: Diagnosis not present

## 2015-04-01 DIAGNOSIS — C3410 Malignant neoplasm of upper lobe, unspecified bronchus or lung: Secondary | ICD-10-CM

## 2015-04-01 DIAGNOSIS — R112 Nausea with vomiting, unspecified: Secondary | ICD-10-CM | POA: Diagnosis not present

## 2015-04-01 DIAGNOSIS — C7951 Secondary malignant neoplasm of bone: Secondary | ICD-10-CM

## 2015-04-01 LAB — GLUCOSE, CAPILLARY: Glucose-Capillary: 108 mg/dL — ABNORMAL HIGH (ref 65–99)

## 2015-04-01 MED ORDER — OXYCODONE HCL 10 MG PO TABS: ORAL_TABLET | ORAL | Status: DC

## 2015-04-01 MED ORDER — FLUDEOXYGLUCOSE F - 18 (FDG) INJECTION
6.6000 | Freq: Once | INTRAVENOUS | Status: AC | PRN
  Administered 2015-04-01: 6.6 via INTRAVENOUS

## 2015-04-02 ENCOUNTER — Other Ambulatory Visit: Payer: Self-pay

## 2015-04-02 ENCOUNTER — Encounter: Payer: Self-pay | Admitting: *Deleted

## 2015-04-02 ENCOUNTER — Ambulatory Visit: Payer: Medicare HMO

## 2015-04-02 ENCOUNTER — Inpatient Hospital Stay (HOSPITAL_COMMUNITY)
Admission: EM | Admit: 2015-04-02 | Discharge: 2015-04-06 | DRG: 073 | Disposition: A | Discharge status: Home / Self Care | Payer: Medicare HMO | Attending: Internal Medicine | Admitting: Internal Medicine

## 2015-04-02 ENCOUNTER — Encounter (HOSPITAL_COMMUNITY): Payer: Self-pay | Admitting: Emergency Medicine

## 2015-04-02 ENCOUNTER — Emergency Department (HOSPITAL_COMMUNITY): Payer: Medicare HMO

## 2015-04-02 DIAGNOSIS — C3490 Malignant neoplasm of unspecified part of unspecified bronchus or lung: Secondary | ICD-10-CM | POA: Diagnosis present

## 2015-04-02 DIAGNOSIS — C7951 Secondary malignant neoplasm of bone: Secondary | ICD-10-CM | POA: Diagnosis present

## 2015-04-02 DIAGNOSIS — M542 Cervicalgia: Secondary | ICD-10-CM | POA: Diagnosis present

## 2015-04-02 DIAGNOSIS — Z7952 Long term (current) use of systemic steroids: Secondary | ICD-10-CM

## 2015-04-02 DIAGNOSIS — G8929 Other chronic pain: Secondary | ICD-10-CM | POA: Diagnosis present

## 2015-04-02 DIAGNOSIS — Z79891 Long term (current) use of opiate analgesic: Secondary | ICD-10-CM

## 2015-04-02 DIAGNOSIS — Z885 Allergy status to narcotic agent status: Secondary | ICD-10-CM

## 2015-04-02 DIAGNOSIS — F419 Anxiety disorder, unspecified: Secondary | ICD-10-CM | POA: Diagnosis present

## 2015-04-02 DIAGNOSIS — D649 Anemia, unspecified: Secondary | ICD-10-CM | POA: Diagnosis present

## 2015-04-02 DIAGNOSIS — Z79899 Other long term (current) drug therapy: Secondary | ICD-10-CM

## 2015-04-02 DIAGNOSIS — E1143 Type 2 diabetes mellitus with diabetic autonomic (poly)neuropathy: Principal | ICD-10-CM | POA: Diagnosis present

## 2015-04-02 DIAGNOSIS — Z87891 Personal history of nicotine dependence: Secondary | ICD-10-CM

## 2015-04-02 DIAGNOSIS — D696 Thrombocytopenia, unspecified: Secondary | ICD-10-CM | POA: Diagnosis present

## 2015-04-02 DIAGNOSIS — Z6821 Body mass index (BMI) 21.0-21.9, adult: Secondary | ICD-10-CM

## 2015-04-02 DIAGNOSIS — R1110 Vomiting, unspecified: Secondary | ICD-10-CM

## 2015-04-02 DIAGNOSIS — R569 Unspecified convulsions: Secondary | ICD-10-CM | POA: Diagnosis present

## 2015-04-02 DIAGNOSIS — Z880 Allergy status to penicillin: Secondary | ICD-10-CM

## 2015-04-02 DIAGNOSIS — B1920 Unspecified viral hepatitis C without hepatic coma: Secondary | ICD-10-CM | POA: Diagnosis present

## 2015-04-02 DIAGNOSIS — Z8249 Family history of ischemic heart disease and other diseases of the circulatory system: Secondary | ICD-10-CM

## 2015-04-02 DIAGNOSIS — J449 Chronic obstructive pulmonary disease, unspecified: Secondary | ICD-10-CM | POA: Diagnosis present

## 2015-04-02 DIAGNOSIS — Z833 Family history of diabetes mellitus: Secondary | ICD-10-CM

## 2015-04-02 DIAGNOSIS — M1990 Unspecified osteoarthritis, unspecified site: Secondary | ICD-10-CM | POA: Diagnosis present

## 2015-04-02 DIAGNOSIS — M25519 Pain in unspecified shoulder: Secondary | ICD-10-CM | POA: Diagnosis present

## 2015-04-02 DIAGNOSIS — E43 Unspecified severe protein-calorie malnutrition: Secondary | ICD-10-CM | POA: Diagnosis present

## 2015-04-02 DIAGNOSIS — R112 Nausea with vomiting, unspecified: Secondary | ICD-10-CM | POA: Diagnosis present

## 2015-04-02 DIAGNOSIS — K5900 Constipation, unspecified: Secondary | ICD-10-CM | POA: Diagnosis present

## 2015-04-02 DIAGNOSIS — R109 Unspecified abdominal pain: Secondary | ICD-10-CM | POA: Diagnosis present

## 2015-04-02 DIAGNOSIS — K3184 Gastroparesis: Secondary | ICD-10-CM | POA: Diagnosis present

## 2015-04-02 DIAGNOSIS — F319 Bipolar disorder, unspecified: Secondary | ICD-10-CM | POA: Diagnosis present

## 2015-04-02 DIAGNOSIS — K219 Gastro-esophageal reflux disease without esophagitis: Secondary | ICD-10-CM | POA: Diagnosis present

## 2015-04-02 DIAGNOSIS — Z888 Allergy status to other drugs, medicaments and biological substances status: Secondary | ICD-10-CM

## 2015-04-02 DIAGNOSIS — Z886 Allergy status to analgesic agent status: Secondary | ICD-10-CM

## 2015-04-02 DIAGNOSIS — C799 Secondary malignant neoplasm of unspecified site: Secondary | ICD-10-CM

## 2015-04-02 DIAGNOSIS — I10 Essential (primary) hypertension: Secondary | ICD-10-CM | POA: Diagnosis present

## 2015-04-02 DIAGNOSIS — F411 Generalized anxiety disorder: Secondary | ICD-10-CM | POA: Diagnosis present

## 2015-04-02 LAB — CBC WITH DIFFERENTIAL/PLATELET
Basophils Absolute: 0 10*3/uL (ref 0.0–0.1)
Basophils Relative: 0 % (ref 0–1)
EOS ABS: 0.1 10*3/uL (ref 0.0–0.7)
Eosinophils Relative: 1 % (ref 0–5)
HCT: 37.8 % (ref 36.0–46.0)
Hemoglobin: 12.2 g/dL (ref 12.0–15.0)
LYMPHS ABS: 1 10*3/uL (ref 0.7–4.0)
LYMPHS PCT: 12 % (ref 12–46)
MCH: 29 pg (ref 26.0–34.0)
MCHC: 32.3 g/dL (ref 30.0–36.0)
MCV: 90 fL (ref 78.0–100.0)
MONOS PCT: 6 % (ref 3–12)
Monocytes Absolute: 0.5 10*3/uL (ref 0.1–1.0)
NEUTROS PCT: 80 % — AB (ref 43–77)
Neutro Abs: 6.5 10*3/uL (ref 1.7–7.7)
Platelets: 112 10*3/uL — ABNORMAL LOW (ref 150–400)
RBC: 4.2 MIL/uL (ref 3.87–5.11)
RDW: 14.2 % (ref 11.5–15.5)
WBC: 8.1 10*3/uL (ref 4.0–10.5)

## 2015-04-02 LAB — URINALYSIS, ROUTINE W REFLEX MICROSCOPIC
Bilirubin Urine: NEGATIVE
Glucose, UA: NEGATIVE mg/dL
HGB URINE DIPSTICK: NEGATIVE
Ketones, ur: NEGATIVE mg/dL
LEUKOCYTES UA: NEGATIVE
NITRITE: NEGATIVE
Protein, ur: 30 mg/dL — AB
Specific Gravity, Urine: 1.015 (ref 1.005–1.030)
Urobilinogen, UA: 1 mg/dL (ref 0.0–1.0)
pH: 8.5 — ABNORMAL HIGH (ref 5.0–8.0)

## 2015-04-02 LAB — RAPID URINE DRUG SCREEN, HOSP PERFORMED
AMPHETAMINES: NOT DETECTED
BARBITURATES: NOT DETECTED
Benzodiazepines: POSITIVE — AB
COCAINE: NOT DETECTED
OPIATES: POSITIVE — AB
Tetrahydrocannabinol: POSITIVE — AB

## 2015-04-02 LAB — COMPREHENSIVE METABOLIC PANEL
ALT: 13 U/L — ABNORMAL LOW (ref 14–54)
AST: 23 U/L (ref 15–41)
Albumin: 3.8 g/dL (ref 3.5–5.0)
Alkaline Phosphatase: 337 U/L — ABNORMAL HIGH (ref 38–126)
Anion gap: 14 (ref 5–15)
BUN: 11 mg/dL (ref 6–20)
CALCIUM: 9.4 mg/dL (ref 8.9–10.3)
CHLORIDE: 96 mmol/L — AB (ref 101–111)
CO2: 28 mmol/L (ref 22–32)
Creatinine, Ser: 0.69 mg/dL (ref 0.44–1.00)
GFR calc Af Amer: >60 mL/min (ref >60)
GFR calc non Af Amer: >60 mL/min (ref >60)
GLUCOSE: 136 mg/dL — AB (ref 65–99)
Potassium: 3.8 mmol/L (ref 3.5–5.1)
Sodium: 138 mmol/L (ref 135–145)
TOTAL PROTEIN: 7.2 g/dL (ref 6.5–8.1)
Total Bilirubin: 1 mg/dL (ref 0.3–1.2)

## 2015-04-02 LAB — I-STAT CG4 LACTIC ACID, ED: Lactic Acid, Venous: 1.82 mmol/L (ref 0.5–2.0)

## 2015-04-02 LAB — URINE MICROSCOPIC-ADD ON

## 2015-04-02 LAB — TROPONIN I

## 2015-04-02 LAB — LIPASE, BLOOD: Lipase: 11 U/L — ABNORMAL LOW (ref 22–51)

## 2015-04-02 MED ORDER — SODIUM CHLORIDE 0.9 % IV SOLN
INTRAVENOUS | Status: AC
  Administered 2015-04-02: 15:00:00 via INTRAVENOUS

## 2015-04-02 MED ORDER — HYDROMORPHONE HCL 1 MG/ML IJ SOLN
1.0000 mg | Freq: Once | INTRAMUSCULAR | Status: AC
  Administered 2015-04-02: 1 mg via INTRAVENOUS
  Filled 2015-04-02: qty 1

## 2015-04-02 MED ORDER — ONDANSETRON HCL 4 MG/2ML IJ SOLN
4.0000 mg | Freq: Once | INTRAMUSCULAR | Status: AC
  Administered 2015-04-02: 4 mg via INTRAVENOUS
  Filled 2015-04-02: qty 2

## 2015-04-02 MED ORDER — ONDANSETRON HCL 4 MG/2ML IJ SOLN
4.0000 mg | Freq: Four times a day (QID) | INTRAMUSCULAR | Status: DC | PRN
  Administered 2015-04-03: 4 mg via INTRAVENOUS
  Filled 2015-04-02: qty 2

## 2015-04-02 MED ORDER — METOCLOPRAMIDE HCL 5 MG/ML IJ SOLN
10.0000 mg | Freq: Once | INTRAMUSCULAR | Status: AC
  Administered 2015-04-02: 10 mg via INTRAVENOUS
  Filled 2015-04-02: qty 2

## 2015-04-02 MED ORDER — SODIUM CHLORIDE 0.9 % IV BOLUS (SEPSIS)
1000.0000 mL | Freq: Once | INTRAVENOUS | Status: AC
  Administered 2015-04-02: 1000 mL via INTRAVENOUS

## 2015-04-02 MED ORDER — ONDANSETRON HCL 4 MG PO TABS: 4.0000 mg | ORAL_TABLET | Freq: Four times a day (QID) | ORAL | Status: DC | PRN

## 2015-04-02 MED ORDER — HYDROMORPHONE HCL 1 MG/ML IJ SOLN
1.0000 mg | INTRAMUSCULAR | Status: DC | PRN
  Administered 2015-04-02 – 2015-04-06 (×27): 1 mg via INTRAVENOUS
  Filled 2015-04-02 (×27): qty 1

## 2015-04-02 MED ORDER — DEXTROSE-NACL 5-0.9 % IV SOLN
INTRAVENOUS | Status: DC
  Administered 2015-04-03: 01:00:00 via INTRAVENOUS

## 2015-04-02 MED ORDER — METOCLOPRAMIDE HCL 5 MG/ML IJ SOLN
5.0000 mg | Freq: Three times a day (TID) | INTRAMUSCULAR | Status: DC
  Administered 2015-04-02 – 2015-04-05 (×9): 5 mg via INTRAVENOUS
  Filled 2015-04-02 (×4): qty 1
  Filled 2015-04-02: qty 2
  Filled 2015-04-02 (×12): qty 1
  Filled 2015-04-02: qty 2

## 2015-04-02 MED ORDER — LORAZEPAM 2 MG/ML IJ SOLN
0.5000 mg | Freq: Three times a day (TID) | INTRAMUSCULAR | Status: DC | PRN
  Administered 2015-04-02 – 2015-04-05 (×5): 0.5 mg via INTRAVENOUS
  Filled 2015-04-02 (×5): qty 1

## 2015-04-02 MED ORDER — METHYLPREDNISOLONE SODIUM SUCC 40 MG IJ SOLR
20.0000 mg | Freq: Two times a day (BID) | INTRAMUSCULAR | Status: DC
  Administered 2015-04-02 – 2015-04-06 (×8): 20 mg via INTRAVENOUS
  Filled 2015-04-02 (×2): qty 0.5
  Filled 2015-04-02 (×2): qty 1
  Filled 2015-04-02 (×6): qty 0.5

## 2015-04-02 NOTE — Progress Notes (Signed)
Chaplain visited with patient who says she is tired. Patient asked chaplain to pray. Chaplain prayed while patient sung songs. Chaplain provided support to patient. Chaplain will follow up on patient.   04/02/15 1600  Clinical Encounter Type  Visited With Patient  Visit Type Follow-up;Spiritual support;Social support  Referral From [employer]

## 2015-04-02 NOTE — H&P (Signed)
History and Physical  [name] [name] BWG:[id number] DOB: Aug 29, 1951 DOA: 04/02/2015  Referring physician: Delphina Cahill, ER physician PCP: Harvie Junior, MD   Chief Complaint: Nausea vomiting abdominal pain  HPI: [name] [name] is a 64 y.o. female  With past mental history of episodes of chronic abdominal pain along with nausea and vomiting in the past this had an extensive workup and usually attributed to gastroparesis. She is also diagnosed in the past year with metastatic lung cancer.  Patient been just discharged several weeks ago from the hospitalist service for abdominal pain causing nausea and vomiting. She relates that she pretty much always has pain as well as nausea and vomiting, however The point today he became intractable and she could not keep anything down including her pain medications, nausea medicines or chemotherapy. She came into emergency room for further evaluation. She is noted to be tachycardic and clinical signs of being dehydrated. Labs are unrevealing. No imaging done other than abdominal x-ray which was unremarkable. Lactic acid level within normal limits at 1.8. Hospitalists were called for further evaluation and admission.   Review of Systems:  Patient seen after arrival to floor. Pt complains of abdominal pain, and when asked to quantify if any area is worse off than others, point to more left lateral. She complains of associated nausea and vomiting.  Pt denies any headaches, vision changes, dysphagia, chest pain, palpitations, acute shortness of breath, wheeze, cough, hematuria, dysuria, diarrhea, focal extremity numbness weakness or pain.  Review of systems are otherwise negative  Past Medical History  Diagnosis Date  . Hypertension   . Bipolar 1 disorder   . Heart murmur   . Sciatica   . Arthritis   . Anxiety   . Diverticulosis   . Chronic low back pain   . DDD (degenerative disc disease), lumbar   . Chronic shoulder pain   . Chronic neck pain     . Hepatitis B     Unclear when initially diagnosed, labs in Epic from 03/09/13  . Hepatitis C     Unclear when initially diagnosed, labs in Epic from 03/09/13  . C. difficile diarrhea     April and February 2014  . COPD (chronic obstructive pulmonary disease)   . GERD (gastroesophageal reflux disease)   . H/O hiatal hernia   . Headache(784.0)     hx  . PONV (postoperative nausea and vomiting)   . Seizures     on meds  . Diabetes mellitus   . Adenocarcinoma of lung, stage 1    Past Surgical History  Procedure Laterality Date  . Facial tumor removal Right 2000  . Brain tumor removal  09  . Brain surgery      in [place]  . Video bronchoscopy  12/04/2012    Procedure: VIDEO BRONCHOSCOPY;  Surgeon: Grace Isaac, MD;  Location[facility];  Service: Thoracic;  Laterality: N/A;  . Video assisted thoracoscopy (vats)/wedge resection  12/04/2012    Procedure: VIDEO ASSISTED THORACOSCOPY (VATS)/WEDGE RESECTION;  Surgeon: Grace Isaac, MD;  Location: [place];  Service: Thoracic;  Laterality: Right;  . Lobectomy  12/04/2012    Procedure: LOBECTOMY;  Surgeon: Grace Isaac, MD;  Location: [place];  Service: Thoracic;  Laterality: Right;  completion of right upper lobectomy and lymph node disection, placement of on q pump  . Tubal ligation    . Tonsillectomy    . Anterior cervical decomp/discectomy fusion N/A 03/05/2014    Procedure: ANTERIOR CERVICAL DECOMPRESSION/DISCECTOMY FUSION 2 LEVELS;  Surgeon: Sinclair Ship, MD;  Location: [place];  Service: Orthopedics;  Laterality: N/A;  Anterior cervical decompression fusion, cervical 4-5, cervical 5-6 with instrumentation, allograft.   Social History:  reports that she quit smoking about 20 months ago. Her smoking use included Cigarettes. She has a 2.5 pack-year smoking history. She has never used smokeless tobacco. She reports that she uses illicit drugs (Hydrocodone and Marijuana). She reports that she does not drink alcohol. Patient  lives at home with her son & is able to participate in activities of daily living with use of a walker or cane  Allergies  Allergen Reactions  . Haldol [Haloperidol Decanoate] Other (See Comments)    tongue swelling  . Acetaminophen Other (See Comments)    Stomach pain  . Ibuprofen Other (See Comments)    Stomach pain  . Metronidazole Other (See Comments)    Severe stomach upset--was instructed to avoid Flagyl.  . Morphine And Related Hives    Tolerates hydromorphone.  Marland Kitchen Penicillins Rash    Family History  Problem Relation Age of Onset  . Hypertension Father     deceased  . Diabetes Father   . Heart disease Father   . Hyperlipidemia Father   . Hypertension Mother   . Hyperlipidemia Mother   . Diabetes Mother   . Hypertension Sister   . Hyperlipidemia Sister   . Hypertension Brother   . Hyperlipidemia Brother       Prior to Admission medications   Medication Sig Start Date End Date Taking? Authorizing Provider  ALPRAZolam Duanne Moron) 1 MG tablet Take 0.5 mg by mouth 2 (two) times daily.     Historical Provider, MD  Cyanocobalamin (VITAMIN B-12 IJ) Inject as directed every 30 (thirty) days. Administered by Dr. York Ram around the 1st of each month    Historical Provider, MD  dexamethasone (DECADRON) 4 MG tablet Take 1 tablet (4 mg total) by mouth daily. 03/12/15   Oswald Hillock, MD  dicyclomine (BENTYL) 20 MG tablet Take 20 mg by mouth 3 (three) times daily with meals.    Historical Provider, MD  fluconazole (DIFLUCAN) 100 MG tablet Take 1 tablet (100 mg total) by mouth daily. 03/26/15   Tyler Pita, MD  folic acid (FOLVITE) 1 MG tablet Take 1 tablet (1 mg total) by mouth daily. 03/23/15   Truitt Merle, MD  gabapentin (NEURONTIN) 400 MG capsule Take 400 mg by mouth 3 (three) times daily. Nerve pain    Historical Provider, MD  HYDROmorphone (DILAUDID) 2 MG tablet Take 0.5-1 tablets (1-2 mg total) by mouth every 4 (four) hours as needed for severe pain. 03/24/15   Costin Karlyne Greenspan,  MD  hydrOXYzine (VISTARIL) 25 MG capsule Take 25 mg by mouth daily.    Historical Provider, MD  Ipratropium-Albuterol (COMBIVENT RESPIMAT) 20-100 MCG/ACT AERS respimat Inhale 2 puffs into the lungs daily as needed for wheezing or shortness of breath.     Historical Provider, MD  KLOR-CON M20 20 MEQ tablet Take 20 mEq by mouth every morning. with food 01/04/15   Historical Provider, MD  loratadine (CLARITIN) 10 MG tablet Take 10 mg by mouth every morning.     Historical Provider, MD  magnesium hydroxide (MILK OF MAGNESIA) 400 MG/5ML suspension Take 5 mLs by mouth daily as needed for mild constipation. 03/24/15   Costin Karlyne Greenspan, MD  Menthol-Methyl Salicylate (MUSCLE RUB) 10-15 % CREA Apply 1 application topically daily as needed for muscle pain. Left shoulder pain    Historical Provider, MD  metFORMIN (GLUCOPHAGE) 500 MG tablet Take 500 mg by mouth 2 (two) times daily with a meal.    Historical Provider, MD  metoCLOPramide (REGLAN) 5 MG tablet Take 1 tablet (5 mg total) by mouth 3 (three) times daily before meals. 03/12/15   Oswald Hillock, MD  metoprolol (LOPRESSOR) 50 MG tablet Take 1 tablet (50 mg total) by mouth 2 (two) times daily. 03/12/15   Oswald Hillock, MD  morphine (MS CONTIN) 30 MG 12 hr tablet Take 30 mg by mouth every 12 (twelve) hours.    Truitt Merle, MD  olmesartan (BENICAR) 40 MG tablet Take 1 tablet (40 mg total) by mouth daily. 03/12/15   Oswald Hillock, MD  omeprazole (PRILOSEC) 20 MG capsule Take 1 capsule (20 mg total) by mouth 2 (two) times daily before a meal. 03/26/15   Tyler Pita, MD  ondansetron (ZOFRAN-ODT) 8 MG disintegrating tablet Take 1 tablet by mouth every 6 (six) hours as needed for nausea or vomiting.  03/02/15   Historical Provider, MD  OXcarbazepine (TRILEPTAL) 150 MG tablet Take 150 mg by mouth 3 (three) times daily.  11/24/13   Historical Provider, MD  OxyCODONE (OXYCONTIN) 20 mg T12A 12 hr tablet Take 1 tablet (20 mg total) by mouth every 12 (twelve) hours. Patient not  taking: Reported on 03/26/2015 03/23/15   Truitt Merle, MD  Oxycodone HCl 10 MG TABS Take 1-2 tablet (10 mg total) by mouth every 4 (four) hours as needed for moderate pain. 04/01/15   Truitt Merle, MD  polyethylene glycol [facility] / Floria Raveling) packet Take 17 g by mouth 2 (two) times daily as needed for mild constipation or moderate constipation.    Historical Provider, MD  senna-docusate (SENOKOT-S) 8.6-50 MG per tablet Take 2 tablets by mouth 2 (two) times daily. 03/12/15   Oswald Hillock, MD  simvastatin (ZOCOR) 20 MG tablet Take 20 mg by mouth at bedtime.     Historical Provider, MD  tiZANidine (ZANAFLEX) 4 MG tablet Take 4 mg by mouth every 8 (eight) hours as needed for muscle spasms.    Historical Provider, MD  triamcinolone cream (KENALOG) 0.1 % Apply 1 application topically 2 (two) times daily as needed (for rash).  11/30/14   Historical Provider, MD  Vitamin D, Ergocalciferol, (DRISDOL) 50000 UNITS CAPS capsule Take 50,000 Units by mouth every Monday.     Historical Provider, MD    Physical Exam: BP 157/92 mmHg  Pulse 96  Temp(Src) 98.7 F (37.1 C) (Oral)  Resp 18  Ht '5\' 4"'$  (1.626 m)  Wt 57 kg (125 lb 10.6 oz)  BMI 21.56 kg/m2  SpO2 96%  General:  Alert and oriented 3, mild distress looking uncomfortable from generalized pain Eyes: Sclera nonicteric, extraocular movements are intact ENT: Normocephalic and atraumatic, mucous membranes are dry Neck: No JVD Cardiovascular: Regular rhythm, borderline tachycardia, Port-A-Cath noted Respiratory: Clear to auscultation bilaterally Abdomen: Soft, tender more on the left side, no guarding, nondistended, scant bowel sounds Skin: No skin breaks, tears or lesions Musculoskeletal: No clubbing or cyanosis or edema Psychiatric: [name] appropriate, no evidence of psychoses Neurologic: No focal deficits           Labs on Admission:  Basic Metabolic Panel:  Recent Labs Lab 04/02/15 0830  NA 138  K 3.8  CL 96*  CO2 28  GLUCOSE 136*  BUN 11    CREATININE 0.69  CALCIUM 9.4   Liver Function Tests:  Recent Labs Lab 04/02/15 0830  AST 23  ALT  13*  ALKPHOS 337*  BILITOT 1.0  PROT 7.2  ALBUMIN 3.8    Recent Labs Lab 04/02/15 0830  LIPASE 11*   No results for input(s): AMMONIA in the last 168 hours. CBC:  Recent Labs Lab 04/02/15 0830  WBC 8.1  NEUTROABS 6.5  HGB 12.2  HCT 37.8  MCV 90.0  PLT 112*   Cardiac Enzymes:  Recent Labs Lab 04/02/15 0830  TROPONINI <0.03    BNP (last 3 results) No results for input(s): BNP in the last 8760 hours.  ProBNP (last 3 results) No results for input(s): PROBNP in the last 8760 hours.  CBG:  Recent Labs Lab 04/01/15 1110  GLUCAP 108*    Radiological Exams on Admission: Nm Pet Image Restag (ps) Skull Base To Thigh  04/01/2015   CLINICAL DATA:  Subsequent treatment strategy for lung cancer. Restaging examination.  EXAM: NUCLEAR MEDICINE PET SKULL BASE TO THIGH  TECHNIQUE: 6.6 mCi F-18 FDG was injected intravenously. Full-ring PET imaging was performed from the skull base to thigh after the radiotracer. CT data was obtained and used for attenuation correction and anatomic localization.  FASTING BLOOD GLUCOSE:  Value: 108 mg/dl  COMPARISON:  PET-CT 11/21/2012. Chest CT 02/24/2015. CT of the abdomen and pelvis 02/17/2015.  FINDINGS: NECK  No hypermetabolic lymph nodes in the neck.  CHEST  Status post right upper lobectomy. No hypermetabolic mediastinal or hilar nodes. No suspicious pulmonary nodules on the CT scan. Right internal jugular single-lumen porta cath with tip terminating in the right atrium.  ABDOMEN/PELVIS  No abnormal hypermetabolic activity within the liver, pancreas, adrenal glands, or spleen. No hypermetabolic lymph nodes in the abdomen or pelvis. No significant volume of ascites. No pneumoperitoneum. No pathologic distention of small bowel. Normal appendix. Mild atherosclerosis throughout the abdominal and pelvic vasculature, without definite aneurysm.   SKELETON  Innumerable mixed lytic and sclerotic hypermetabolic lesions throughout the visualized axial and appendicular skeleton, compatible with widespread metastatic disease. Specific examples include T8 (SUVmax = 8.0), L2 (SUVmax = 8.0), and the right ilium (SUVmax = 8.5). Lumbar rod and screw fixation device on the left side at L4-L5.  IMPRESSION: 1. Widespread metastatic disease throughout the axial and appendicular skeleton. 2. Status post right upper lobectomy. No evidence to suggest local recurrence of disease or non osseous metastatic disease in the neck, chest, abdomen or pelvis. 3. Additional incidental findings, as above.   Electronically Signed   By: Vinnie Langton M.D.   On: 04/01/2015 13:02   Dg Abd Acute W/chest  04/02/2015   CLINICAL DATA:  Lung cancer and bone metastases. Generalized mid abdominal pain. Emesis.  EXAM: DG ABDOMEN ACUTE W/ 1V CHEST  COMPARISON:  PET scan 04/01/2015.  FINDINGS: Mild cardiomegaly is present without failure. A right IJ Port-A-Cath is stable in position. No focal airspace disease is present.  Supine and decubitus views the abdomen demonstrating normal bowel gas pattern. There is no evidence for obstruction or free air. Surgical changes of the lower lumbar spine are again noted. Diffuse osseous metastases are not well appreciated by plain film radiographs.  IMPRESSION: 1. Stable cardiomegaly without failure. 2. Normal bowel gas pattern.  No acute abnormality of the abdomen. 3. Stable right IJ Port-A-Cath. 4. Bone metastases are not well visualized.   Electronically Signed   By: San Morelle M.D.   On: 04/02/2015 09:12    EKG: Independently reviewed. Sinus tachycardia  Assessment/Plan Present on Admission:  . Nausea and vomiting with general abdominal pain, although worse on left side: Unclear  etiology. Exam notes very few bowel sounds, however abdominal x-ray notes no signs of ileus. She had recent CT done just a few weeks ago. She's also had extensive  workup in the past for this. We'll plan to treat with IV fluids, pain and nausea medication, Reglan. This also could be from her chronic constipation. We'll try aggressive enema regimen and if no relief, could consider CT imaging in the next few days. We'll also ask nutrition for assessment of status. Suspect protein calorie malnutrition.  In addition, if rest of workup is unrevealing, this could be cyclic vomiting syndrome from chronic THC use. Patient states she uses THC to help with her nausea.  . Metastatic primary lung cancer: Stable for now. Continue chemotherapy   Tobacco abuse: Patient declines patch   . DM gastroparesis: As above, IV Reglan  . Generalized anxiety disorder: As above, Chronic constipation: When she is cleaned out, needs better bowel regimen Consultants: None  Code Status: Full code  Family Communication: Family not present, left message for son  Disposition Plan: Patient will likely always have chronic pain and nausea. Need to get her to the point where she can tolerate by mouth  Time spent: 45 minutes  [facility] Pager [phone]

## 2015-04-02 NOTE — ED Notes (Signed)
Pt made aware of need for urine specimen 

## 2015-04-02 NOTE — ED Notes (Signed)
Patient transported to CT 

## 2015-04-02 NOTE — Progress Notes (Signed)
[place] Work  Clinical Social Work was to meet with pt after her treatment today and complete SCAT application. CSW reviewed chart and noted pt admitted. CSW to follow up at later date to complete SCAT application.    Loren Racer, [place] Worker [place]  [facility] Phone: [phone] Fax: [phone]

## 2015-04-02 NOTE — ED Notes (Signed)
Dr Alvino Chapel notified that pt still has 10/10 pain and nausea

## 2015-04-02 NOTE — ED Notes (Addendum)
Pt with Hx of lung cancer, metastasis to bone, and brain tumor removal c/o emesis, headache, and generalized abdominal pain onset yesterday after radiation treatment.

## 2015-04-02 NOTE — ED Provider Notes (Signed)
CSN: [id number]     Arrival date & time 04/02/15  0802 History   First MD Initiated Contact with Patient 04/02/15 0809     Chief Complaint  Patient presents with  . Emesis  . Abdominal Pain     (Consider location/radiation/quality/duration/timing/severity/associated sxs/prior Treatment) [name] a 64 y.o. female presenting with vomiting and abdominal pain. The history is provided by the patient.  Emesis Severity:  Moderate Associated symptoms: abdominal pain and diarrhea   Associated symptoms: no chills   Abdominal Pain Associated symptoms: constipation, diarrhea, nausea and vomiting   Associated symptoms: no chills and no fever    patient presents with nausea vomiting a little bit diarrhea and lower abdominal pain. Began yesterday. States she is also sweaty. She is diaphoretic on my exam. Has metastatic lung cancer is on chronic pain medicines. States she still has medications. No fevers. She's had a decreased appetite. She had radiation treatment yesterday. She states she's had a little blood in the stool that has been darker also.  Past Medical History  Diagnosis Date  . Hypertension   . Bipolar 1 disorder   . Heart murmur   . Sciatica   . Arthritis   . Anxiety   . Diverticulosis   . Chronic low back pain   . DDD (degenerative disc disease), lumbar   . Chronic shoulder pain   . Chronic neck pain   . Hepatitis B     Unclear when initially diagnosed, labs in Epic from 03/09/13  . Hepatitis C     Unclear when initially diagnosed, labs in Epic from 03/09/13  . C. difficile diarrhea     April and February 2014  . COPD (chronic obstructive pulmonary disease)   . GERD (gastroesophageal reflux disease)   . H/O hiatal hernia   . Headache(784.0)     hx  . PONV (postoperative nausea and vomiting)   . Seizures     on meds  . Diabetes mellitus   . Adenocarcinoma of lung, stage 1    Past Surgical History  Procedure Laterality Date  . Facial tumor removal Right 2000  . Brain  tumor removal  09  . Brain surgery      in [place]  . Video bronchoscopy  12/04/2012    Procedure: VIDEO BRONCHOSCOPY;  Surgeon: Grace Isaac, MD;  Location: [facility] OR;  Service: Thoracic;  Laterality: N/A;  . Video assisted thoracoscopy (vats)/wedge resection  12/04/2012    Procedure: VIDEO ASSISTED THORACOSCOPY (VATS)/WEDGE RESECTION;  Surgeon: Grace Isaac, MD;  Location: [place];  Service: Thoracic;  Laterality: Right;  . Lobectomy  12/04/2012    Procedure: LOBECTOMY;  Surgeon: Grace Isaac, MD;  Location: [place];  Service: Thoracic;  Laterality: Right;  completion of right upper lobectomy and lymph node disection, placement of on q pump  . Tubal ligation    . Tonsillectomy    . Anterior cervical decomp/discectomy fusion N/A 03/05/2014    Procedure: ANTERIOR CERVICAL DECOMPRESSION/DISCECTOMY FUSION 2 LEVELS;  Surgeon: Sinclair Ship, MD;  Location: [place];  Service: Orthopedics;  Laterality: N/A;  Anterior cervical decompression fusion, cervical 4-5, cervical 5-6 with instrumentation, allograft.   Family History  Problem Relation Age of Onset  . Hypertension Father     deceased  . Diabetes Father   . Heart disease Father   . Hyperlipidemia Father   . Hypertension Mother   . Hyperlipidemia Mother   . Diabetes Mother   . Hypertension Sister   . Hyperlipidemia  Sister   . Hypertension Brother   . Hyperlipidemia Brother    History  Substance Use Topics  . Smoking status: Former Smoker -- 0.25 packs/day for 10 years    Types: Cigarettes    Quit date: 07/07/2013  . Smokeless tobacco: Never Used     Comment: Smokes pk q 3 days   marajuna aug  . Alcohol Use: No     Comment: quit 4yr ago   OB History    No data available     Review of Systems  Constitutional: Positive for diaphoresis and appetite change. Negative for fever and chills.  Gastrointestinal: Positive for nausea, vomiting, abdominal pain, diarrhea and constipation.  Genitourinary: Positive for pelvic  pain. Negative for flank pain.  Musculoskeletal: Positive for back pain.  Skin: Negative for wound.  Neurological: Negative for seizures.  Hematological: Does not bruise/bleed easily.      Allergies  Haldol; Acetaminophen; Ibuprofen; Metronidazole; Morphine and related; and Penicillins  Home Medications   Prior to Admission medications   Medication Sig Start Date End Date Taking? Authorizing Provider  ALPRAZolam (Duanne Moron 1 MG tablet Take 0.5 mg by mouth 2 (two) times daily.     Historical Provider, MD  Cyanocobalamin (VITAMIN B-12 IJ) Inject as directed every 30 (thirty) days. Administered by Dr. DYork Ramaround the 1st of each month    Historical Provider, MD  dexamethasone (DECADRON) 4 MG tablet Take 1 tablet (4 mg total) by mouth daily. 03/12/15   GOswald Hillock MD  dicyclomine (BENTYL) 20 MG tablet Take 20 mg by mouth 3 (three) times daily with meals.    Historical Provider, MD  fluconazole (DIFLUCAN) 100 MG tablet Take 1 tablet (100 mg total) by mouth daily. 03/26/15   MTyler Pita MD  folic acid (FOLVITE) 1 MG tablet Take 1 tablet (1 mg total) by mouth daily. 03/23/15   YTruitt Merle MD  gabapentin (NEURONTIN) 400 MG capsule Take 400 mg by mouth 3 (three) times daily. Nerve pain    Historical Provider, MD  HYDROmorphone (DILAUDID) 2 MG tablet Take 0.5-1 tablets (1-2 mg total) by mouth every 4 (four) hours as needed for severe pain. 03/24/15   Costin MKarlyne Greenspan MD  hydrOXYzine (VISTARIL) 25 MG capsule Take 25 mg by mouth daily.    Historical Provider, MD  Ipratropium-Albuterol (COMBIVENT RESPIMAT) 20-100 MCG/ACT AERS respimat Inhale 2 puffs into the lungs daily as needed for wheezing or shortness of breath.     Historical Provider, MD  KLOR-CON M20 20 MEQ tablet Take 20 mEq by mouth every morning. with food 01/04/15   Historical Provider, MD  loratadine (CLARITIN) 10 MG tablet Take 10 mg by mouth every morning.     Historical Provider, MD  magnesium hydroxide (MILK OF MAGNESIA) 400  MG/5ML suspension Take 5 mLs by mouth daily as needed for mild constipation. 03/24/15   Costin MKarlyne Greenspan MD  Menthol-Methyl Salicylate (MUSCLE RUB) 10-15 % CREA Apply 1 application topically daily as needed for muscle pain. Left shoulder pain    Historical Provider, MD  metFORMIN (GLUCOPHAGE) 500 MG tablet Take 500 mg by mouth 2 (two) times daily with a meal.    Historical Provider, MD  metoCLOPramide (REGLAN) 5 MG tablet Take 1 tablet (5 mg total) by mouth 3 (three) times daily before meals. 03/12/15   GOswald Hillock MD  metoprolol (LOPRESSOR) 50 MG tablet Take 1 tablet (50 mg total) by mouth 2 (two) times daily. 03/12/15   GOswald Hillock MD  morphine (MS  CONTIN) 30 MG 12 hr tablet Take 30 mg by mouth every 12 (twelve) hours.    Truitt Merle, MD  olmesartan (BENICAR) 40 MG tablet Take 1 tablet (40 mg total) by mouth daily. 03/12/15   Oswald Hillock, MD  omeprazole (PRILOSEC) 20 MG capsule Take 1 capsule (20 mg total) by mouth 2 (two) times daily before a meal. 03/26/15   Tyler Pita, MD  ondansetron (ZOFRAN-ODT) 8 MG disintegrating tablet Take 1 tablet by mouth every 6 (six) hours as needed for nausea or vomiting.  03/02/15   Historical Provider, MD  OXcarbazepine (TRILEPTAL) 150 MG tablet Take 150 mg by mouth 3 (three) times daily.  11/24/13   Historical Provider, MD  OxyCODONE (OXYCONTIN) 20 mg T12A 12 hr tablet Take 1 tablet (20 mg total) by mouth every 12 (twelve) hours. Patient not taking: Reported on 03/26/2015 03/23/15   Truitt Merle, MD  Oxycodone HCl 10 MG TABS Take 1-2 tablet (10 mg total) by mouth every 4 (four) hours as needed for moderate pain. 04/01/15   Truitt Merle, MD  polyethylene glycol [facility] / Floria Raveling) packet Take 17 g by mouth 2 (two) times daily as needed for mild constipation or moderate constipation.    Historical Provider, MD  senna-docusate (SENOKOT-S) 8.6-50 MG per tablet Take 2 tablets by mouth 2 (two) times daily. 03/12/15   Oswald Hillock, MD  simvastatin (ZOCOR) 20 MG tablet Take 20 mg by mouth  at bedtime.     Historical Provider, MD  tiZANidine (ZANAFLEX) 4 MG tablet Take 4 mg by mouth every 8 (eight) hours as needed for muscle spasms.    Historical Provider, MD  triamcinolone cream (KENALOG) 0.1 % Apply 1 application topically 2 (two) times daily as needed (for rash).  11/30/14   Historical Provider, MD  Vitamin D, Ergocalciferol, (DRISDOL) 50000 UNITS CAPS capsule Take 50,000 Units by mouth every Monday.     Historical Provider, MD   BP 157/92 mmHg  Pulse 96  Temp(Src) 98.7 F (37.1 C) (Oral)  Resp 18  Ht '5\' 4"'$  (1.626 m)  Wt 125 lb 10.6 oz (57 kg)  BMI 21.56 kg/m2  SpO2 96% Physical Exam  Constitutional: She appears well-developed.  Eyes: Left eye exhibits no discharge.  Cardiovascular:  Mild tachycardia  Pulmonary/Chest: Breath sounds normal.  Abdominal: There is no tenderness.  Musculoskeletal: Normal range of motion.  Neurological: She is alert.  Skin: Skin is warm. She is diaphoretic.    ED Course  Procedures (including critical care time) Labs Review Labs Reviewed  CBC WITH DIFFERENTIAL/PLATELET - Abnormal; Notable for the following:    Platelets 112 (*)    Neutrophils Relative % 80 (*)    All other components within normal limits  COMPREHENSIVE METABOLIC PANEL - Abnormal; Notable for the following:    Chloride 96 (*)    Glucose, Bld 136 (*)    ALT 13 (*)    Alkaline Phosphatase 337 (*)    All other components within normal limits  LIPASE, BLOOD - Abnormal; Notable for the following:    Lipase 11 (*)    All other components within normal limits  URINALYSIS, ROUTINE W REFLEX MICROSCOPIC - Abnormal; Notable for the following:    Color, Urine AMBER (*)    APPearance CLOUDY (*)    pH 8.5 (*)    Protein, ur 30 (*)    All other components within normal limits  URINE MICROSCOPIC-ADD ON - Abnormal; Notable for the following:    Squamous Epithelial / LPF  FEW (*)    Bacteria, UA MANY (*)    Casts HYALINE CASTS (*)    All other components within normal  limits  URINE CULTURE  TROPONIN I  OCCULT BLOOD X 1 CARD TO LAB, STOOL  I-STAT CG4 LACTIC ACID, ED    Imaging Review Nm Pet Image Restag (ps) Skull Base To Thigh  04/01/2015   CLINICAL DATA:  Subsequent treatment strategy for lung cancer. Restaging examination.  EXAM: NUCLEAR MEDICINE PET SKULL BASE TO THIGH  TECHNIQUE: 6.6 mCi F-18 FDG was injected intravenously. Full-ring PET imaging was performed from the skull base to thigh after the radiotracer. CT data was obtained and used for attenuation correction and anatomic localization.  FASTING BLOOD GLUCOSE:  Value: 108 mg/dl  COMPARISON:  PET-CT 11/21/2012. Chest CT 02/24/2015. CT of the abdomen and pelvis 02/17/2015.  FINDINGS: NECK  No hypermetabolic lymph nodes in the neck.  CHEST  Status post right upper lobectomy. No hypermetabolic mediastinal or hilar nodes. No suspicious pulmonary nodules on the CT scan. Right internal jugular single-lumen porta cath with tip terminating in the right atrium.  ABDOMEN/PELVIS  No abnormal hypermetabolic activity within the liver, pancreas, adrenal glands, or spleen. No hypermetabolic lymph nodes in the abdomen or pelvis. No significant volume of ascites. No pneumoperitoneum. No pathologic distention of small bowel. Normal appendix. Mild atherosclerosis throughout the abdominal and pelvic vasculature, without definite aneurysm.  SKELETON  Innumerable mixed lytic and sclerotic hypermetabolic lesions throughout the visualized axial and appendicular skeleton, compatible with widespread metastatic disease. Specific examples include T8 (SUVmax = 8.0), L2 (SUVmax = 8.0), and the right ilium (SUVmax = 8.5). Lumbar rod and screw fixation device on the left side at L4-L5.  IMPRESSION: 1. Widespread metastatic disease throughout the axial and appendicular skeleton. 2. Status post right upper lobectomy. No evidence to suggest local recurrence of disease or non osseous metastatic disease in the neck, chest, abdomen or pelvis. 3.  Additional incidental findings, as above.   Electronically Signed   By: Vinnie Langton M.D.   On: 04/01/2015 13:02   Dg Abd Acute W/chest  04/02/2015   CLINICAL DATA:  Lung cancer and bone metastases. Generalized mid abdominal pain. Emesis.  EXAM: DG ABDOMEN ACUTE W/ 1V CHEST  COMPARISON:  PET scan 04/01/2015.  FINDINGS: Mild cardiomegaly is present without failure. A right IJ Port-A-Cath is stable in position. No focal airspace disease is present.  Supine and decubitus views the abdomen demonstrating normal bowel gas pattern. There is no evidence for obstruction or free air. Surgical changes of the lower lumbar spine are again noted. Diffuse osseous metastases are not well appreciated by plain film radiographs.  IMPRESSION: 1. Stable cardiomegaly without failure. 2. Normal bowel gas pattern.  No acute abnormality of the abdomen. 3. Stable right IJ Port-A-Cath. 4. Bone metastases are not well visualized.   Electronically Signed   By: San Morelle M.D.   On: 04/02/2015 09:12     EKG Interpretation   Date/Time:  Friday Apr 02 2015 08:32:31 EDT Ventricular Rate:  100 PR Interval:  185 QRS Duration: 67 QT Interval:  381 QTC Calculation: 491 R Axis:   83 Text Interpretation:  Sinus tachycardia Atrial premature complex Probable  left atrial enlargement Anteroseptal infarct, age indeterminate peaked T  waves Confirmed by Toniesha Zellner  MD, Blayze Haen [phone]) on 04/02/2015 8:35:07 AM      MDM   Final diagnoses:  Intractable vomiting with nausea, vomiting of unspecified type  Abdominal pain, unspecified abdominal location  Metastatic cancer  Patient with nausea vomiting abdominal pain. Pain uncontrolled. Is on chronic narcotics for abdominal pain and pain from her metastatic cancer. Continued nausea vomiting in the ER. Will admit to internal medicine.    Davonna Belling, MD 04/02/15 [phone]

## 2015-04-03 ENCOUNTER — Encounter (HOSPITAL_COMMUNITY): Payer: Self-pay | Admitting: Neurology

## 2015-04-03 DIAGNOSIS — C799 Secondary malignant neoplasm of unspecified site: Secondary | ICD-10-CM

## 2015-04-03 DIAGNOSIS — E1143 Type 2 diabetes mellitus with diabetic autonomic (poly)neuropathy: Secondary | ICD-10-CM | POA: Diagnosis present

## 2015-04-03 DIAGNOSIS — D696 Thrombocytopenia, unspecified: Secondary | ICD-10-CM | POA: Diagnosis present

## 2015-04-03 DIAGNOSIS — E43 Unspecified severe protein-calorie malnutrition: Secondary | ICD-10-CM | POA: Diagnosis present

## 2015-04-03 DIAGNOSIS — M1990 Unspecified osteoarthritis, unspecified site: Secondary | ICD-10-CM | POA: Diagnosis present

## 2015-04-03 DIAGNOSIS — M25519 Pain in unspecified shoulder: Secondary | ICD-10-CM | POA: Diagnosis present

## 2015-04-03 DIAGNOSIS — I10 Essential (primary) hypertension: Secondary | ICD-10-CM | POA: Diagnosis present

## 2015-04-03 DIAGNOSIS — Z79899 Other long term (current) drug therapy: Secondary | ICD-10-CM | POA: Diagnosis not present

## 2015-04-03 DIAGNOSIS — C7951 Secondary malignant neoplasm of bone: Secondary | ICD-10-CM | POA: Diagnosis present

## 2015-04-03 DIAGNOSIS — D649 Anemia, unspecified: Secondary | ICD-10-CM | POA: Diagnosis present

## 2015-04-03 DIAGNOSIS — M542 Cervicalgia: Secondary | ICD-10-CM | POA: Diagnosis present

## 2015-04-03 DIAGNOSIS — Z7952 Long term (current) use of systemic steroids: Secondary | ICD-10-CM | POA: Diagnosis not present

## 2015-04-03 DIAGNOSIS — Z888 Allergy status to other drugs, medicaments and biological substances status: Secondary | ICD-10-CM | POA: Diagnosis not present

## 2015-04-03 DIAGNOSIS — F419 Anxiety disorder, unspecified: Secondary | ICD-10-CM | POA: Diagnosis present

## 2015-04-03 DIAGNOSIS — C3490 Malignant neoplasm of unspecified part of unspecified bronchus or lung: Secondary | ICD-10-CM

## 2015-04-03 DIAGNOSIS — Z79891 Long term (current) use of opiate analgesic: Secondary | ICD-10-CM | POA: Diagnosis not present

## 2015-04-03 DIAGNOSIS — K5900 Constipation, unspecified: Secondary | ICD-10-CM | POA: Diagnosis present

## 2015-04-03 DIAGNOSIS — K219 Gastro-esophageal reflux disease without esophagitis: Secondary | ICD-10-CM | POA: Diagnosis present

## 2015-04-03 DIAGNOSIS — R1110 Vomiting, unspecified: Secondary | ICD-10-CM | POA: Diagnosis not present

## 2015-04-03 DIAGNOSIS — F319 Bipolar disorder, unspecified: Secondary | ICD-10-CM | POA: Diagnosis present

## 2015-04-03 DIAGNOSIS — F411 Generalized anxiety disorder: Secondary | ICD-10-CM

## 2015-04-03 DIAGNOSIS — G8929 Other chronic pain: Secondary | ICD-10-CM | POA: Diagnosis present

## 2015-04-03 DIAGNOSIS — B1920 Unspecified viral hepatitis C without hepatic coma: Secondary | ICD-10-CM | POA: Diagnosis present

## 2015-04-03 DIAGNOSIS — K3184 Gastroparesis: Secondary | ICD-10-CM | POA: Diagnosis present

## 2015-04-03 DIAGNOSIS — R569 Unspecified convulsions: Secondary | ICD-10-CM | POA: Diagnosis present

## 2015-04-03 DIAGNOSIS — Z87891 Personal history of nicotine dependence: Secondary | ICD-10-CM | POA: Diagnosis not present

## 2015-04-03 DIAGNOSIS — Z8249 Family history of ischemic heart disease and other diseases of the circulatory system: Secondary | ICD-10-CM | POA: Diagnosis not present

## 2015-04-03 DIAGNOSIS — Z833 Family history of diabetes mellitus: Secondary | ICD-10-CM | POA: Diagnosis not present

## 2015-04-03 DIAGNOSIS — R112 Nausea with vomiting, unspecified: Secondary | ICD-10-CM | POA: Diagnosis present

## 2015-04-03 DIAGNOSIS — J449 Chronic obstructive pulmonary disease, unspecified: Secondary | ICD-10-CM | POA: Diagnosis present

## 2015-04-03 DIAGNOSIS — R109 Unspecified abdominal pain: Secondary | ICD-10-CM | POA: Diagnosis not present

## 2015-04-03 DIAGNOSIS — Z880 Allergy status to penicillin: Secondary | ICD-10-CM | POA: Diagnosis not present

## 2015-04-03 DIAGNOSIS — Z885 Allergy status to narcotic agent status: Secondary | ICD-10-CM | POA: Diagnosis not present

## 2015-04-03 DIAGNOSIS — Z886 Allergy status to analgesic agent status: Secondary | ICD-10-CM | POA: Diagnosis not present

## 2015-04-03 DIAGNOSIS — Z6821 Body mass index (BMI) 21.0-21.9, adult: Secondary | ICD-10-CM | POA: Diagnosis not present

## 2015-04-03 LAB — CBC
HEMATOCRIT: 34.6 % — AB (ref 36.0–46.0)
HEMOGLOBIN: 11.1 g/dL — AB (ref 12.0–15.0)
MCH: 28.5 pg (ref 26.0–34.0)
MCHC: 32.1 g/dL (ref 30.0–36.0)
MCV: 88.7 fL (ref 78.0–100.0)
PLATELETS: 116 10*3/uL — AB (ref 150–400)
RBC: 3.9 MIL/uL (ref 3.87–5.11)
RDW: 14.1 % (ref 11.5–15.5)
WBC: 6.9 10*3/uL (ref 4.0–10.5)

## 2015-04-03 LAB — URINE CULTURE

## 2015-04-03 LAB — BASIC METABOLIC PANEL
Anion gap: 10 (ref 5–15)
BUN: 13 mg/dL (ref 6–20)
CALCIUM: 8.7 mg/dL — AB (ref 8.9–10.3)
CO2: 25 mmol/L (ref 22–32)
Chloride: 104 mmol/L (ref 101–111)
Creatinine, Ser: 0.48 mg/dL (ref 0.44–1.00)
GFR calc Af Amer: >60 mL/min (ref >60)
GFR calc non Af Amer: >60 mL/min (ref >60)
Glucose, Bld: 124 mg/dL — ABNORMAL HIGH (ref 65–99)
POTASSIUM: 4 mmol/L (ref 3.5–5.1)
SODIUM: 139 mmol/L (ref 135–145)

## 2015-04-03 MED ORDER — METOCLOPRAMIDE HCL 5 MG PO TABS
5.0000 mg | ORAL_TABLET | Freq: Three times a day (TID) | ORAL | Status: DC
  Administered 2015-04-03 – 2015-04-06 (×8): 5 mg via ORAL
  Filled 2015-04-03 (×11): qty 1

## 2015-04-03 MED ORDER — MEGESTROL ACETATE 40 MG/ML PO SUSP
400.0000 mg | Freq: Every day | ORAL | Status: DC
  Administered 2015-04-03 – 2015-04-06 (×4): 400 mg via ORAL
  Filled 2015-04-03 (×5): qty 10

## 2015-04-03 MED ORDER — HYDROXYZINE PAMOATE 25 MG PO CAPS
25.0000 mg | ORAL_CAPSULE | Freq: Every day | ORAL | Status: DC
  Administered 2015-04-04: 25 mg via ORAL
  Filled 2015-04-03 (×2): qty 1

## 2015-04-03 MED ORDER — MORPHINE SULFATE ER 30 MG PO TBCR
30.0000 mg | EXTENDED_RELEASE_TABLET | Freq: Two times a day (BID) | ORAL | Status: DC
  Administered 2015-04-03 – 2015-04-06 (×6): 30 mg via ORAL
  Filled 2015-04-03 (×6): qty 1

## 2015-04-03 MED ORDER — OXYCODONE HCL 5 MG PO TABS
10.0000 mg | ORAL_TABLET | ORAL | Status: DC
  Administered 2015-04-03 – 2015-04-06 (×16): 10 mg via ORAL
  Filled 2015-04-03 (×16): qty 2

## 2015-04-03 MED ORDER — PANTOPRAZOLE SODIUM 40 MG PO TBEC
40.0000 mg | DELAYED_RELEASE_TABLET | Freq: Every day | ORAL | Status: DC
  Administered 2015-04-03 – 2015-04-06 (×4): 40 mg via ORAL
  Filled 2015-04-03 (×5): qty 1

## 2015-04-03 MED ORDER — IRBESARTAN 75 MG PO TABS
37.5000 mg | ORAL_TABLET | Freq: Every day | ORAL | Status: DC
  Administered 2015-04-03 – 2015-04-06 (×4): 37.5 mg via ORAL
  Filled 2015-04-03 (×4): qty 0.5

## 2015-04-03 MED ORDER — MAGNESIUM HYDROXIDE 400 MG/5ML PO SUSP
30.0000 mL | Freq: Every day | ORAL | Status: DC
  Administered 2015-04-03 – 2015-04-06 (×4): 30 mL via ORAL
  Filled 2015-04-03 (×4): qty 30

## 2015-04-03 MED ORDER — POLYETHYLENE GLYCOL 3350 17 G PO PACK: 17.0000 g | PACK | Freq: Two times a day (BID) | ORAL | Status: DC | PRN

## 2015-04-03 MED ORDER — FOLIC ACID 1 MG PO TABS
1.0000 mg | ORAL_TABLET | Freq: Every day | ORAL | Status: DC
  Administered 2015-04-03 – 2015-04-06 (×4): 1 mg via ORAL
  Filled 2015-04-03 (×5): qty 1

## 2015-04-03 MED ORDER — GABAPENTIN 400 MG PO CAPS
400.0000 mg | ORAL_CAPSULE | Freq: Three times a day (TID) | ORAL | Status: DC
  Administered 2015-04-03 – 2015-04-06 (×10): 400 mg via ORAL
  Filled 2015-04-03 (×11): qty 1

## 2015-04-03 MED ORDER — FLUCONAZOLE 100 MG PO TABS
100.0000 mg | ORAL_TABLET | Freq: Every day | ORAL | Status: DC
  Administered 2015-04-03 – 2015-04-06 (×4): 100 mg via ORAL
  Filled 2015-04-03 (×4): qty 1

## 2015-04-03 MED ORDER — TIZANIDINE HCL 4 MG PO TABS
4.0000 mg | ORAL_TABLET | Freq: Three times a day (TID) | ORAL | Status: DC | PRN
  Filled 2015-04-03: qty 1

## 2015-04-03 MED ORDER — ALPRAZOLAM 0.5 MG PO TABS
0.5000 mg | ORAL_TABLET | Freq: Two times a day (BID) | ORAL | Status: DC
  Administered 2015-04-03 – 2015-04-06 (×6): 0.5 mg via ORAL
  Filled 2015-04-03 (×6): qty 1

## 2015-04-03 MED ORDER — OXCARBAZEPINE 150 MG PO TABS
150.0000 mg | ORAL_TABLET | Freq: Three times a day (TID) | ORAL | Status: DC
  Administered 2015-04-03 – 2015-04-06 (×10): 150 mg via ORAL
  Filled 2015-04-03 (×12): qty 1

## 2015-04-03 MED ORDER — DEXAMETHASONE 4 MG PO TABS: 4.0000 mg | ORAL_TABLET | Freq: Every day | ORAL | Status: DC

## 2015-04-03 MED ORDER — MAGNESIUM HYDROXIDE 400 MG/5ML PO SUSP: 5.0000 mL | Freq: Every day | ORAL | Status: DC | PRN

## 2015-04-03 MED ORDER — SENNOSIDES-DOCUSATE SODIUM 8.6-50 MG PO TABS
2.0000 | ORAL_TABLET | Freq: Two times a day (BID) | ORAL | Status: DC
  Administered 2015-04-03 – 2015-04-06 (×6): 2 via ORAL
  Filled 2015-04-03 (×7): qty 2

## 2015-04-03 NOTE — Progress Notes (Signed)
[facility] PROGRESS NOTE  [name] [name] WNI:[id number] DOB: [birth year] DOA: 04/02/2015 PCP: Harvie Junior, MD  Assessment/Plan: Nausea, vomiting and abdominal pain: Unclear etiology. No ileus or obstruction on abdominal imaging,. Lactic acid within normal limits. Her symtoms appears to have improved. Advanced diet as tolerated. Stool softeners and laxatives as bowel regimen.    Metastatic lung cancer; outpatient follow up with oncology . With mets to axial and appendicular skeleton, pain meds as needed.   Protein energy malnutrition;  Started on megace and nutrition consulted for recommendations.   Mild anemia and thrombocytopenia: possibly from involvement of the bone marrow. Monitor and transfuse as needed.    Code Status: full code.  Family Communication: none at bedside Disposition Plan: pending , possible home tomorrow if her symptoms resolve.    Consultants:  none  Procedures:  none  Antibiotics:  none  HPI/Subjective: Discussed the results of the PET scan, she was in tears and I asked her to keep the appt on Monday with her oncologist and hopefully she can be discharged tomorrow when and if her constipation resolves.  Objective: Filed Vitals:   04/03/15 1310  BP: 164/81  Pulse: 97  Temp: 98.3 F (36.8 C)  Resp: 16    Intake/Output Summary (Last 24 hours) at 04/03/15 1603 Last data filed at 04/03/15 1405  Gross per 24 hour  Intake   2100 ml  Output    500 ml  Net   1600 ml   Filed Weights   04/02/15 1350  Weight: 57 kg (125 lb 10.6 oz)    Exam:   General:  Alert afebrile in  Mild distress frompain all over  Cardiovascular: s1s2  Respiratory: ctab  Abdomen: soft non tender non distended bowel sounds heard  Musculoskeletal: no pedal edema, cyanosis or clubbing.   Data Reviewed: Basic Metabolic Panel:  Recent Labs Lab 04/02/15 0830 04/03/15 0500  NA 138 139  K 3.8 4.0  CL 96* 104  CO2 28 25  GLUCOSE 136* 124*  BUN 11  13  CREATININE 0.69 0.48  CALCIUM 9.4 8.7*   Liver Function Tests:  Recent Labs Lab 04/02/15 0830  AST 23  ALT 13*  ALKPHOS 337*  BILITOT 1.0  PROT 7.2  ALBUMIN 3.8    Recent Labs Lab 04/02/15 0830  LIPASE 11*   No results for input(s): AMMONIA in the last 168 hours. CBC:  Recent Labs Lab 04/02/15 0830 04/03/15 0500  WBC 8.1 6.9  NEUTROABS 6.5  --   HGB 12.2 11.1*  HCT 37.8 34.6*  MCV 90.0 88.7  PLT 112* 116*   Cardiac Enzymes:  Recent Labs Lab 04/02/15 0830  TROPONINI <0.03   BNP (last 3 results) No results for input(s): BNP in the last 8760 hours.  ProBNP (last 3 results) No results for input(s): PROBNP in the last 8760 hours.  CBG:  Recent Labs Lab 04/01/15 1110  GLUCAP 108*    No results found for this or any previous visit (from the past 240 hour(s)).   Studies: Dg Abd Acute W/chest  04/02/2015   CLINICAL DATA:  Lung cancer and bone metastases. Generalized mid abdominal pain. Emesis.  EXAM: DG ABDOMEN ACUTE W/ 1V CHEST  COMPARISON:  PET scan 04/01/2015.  FINDINGS: Mild cardiomegaly is present without failure. A right IJ Port-A-Cath is stable in position. No focal airspace disease is present.  Supine and decubitus views the abdomen demonstrating normal bowel gas pattern. There is no evidence for obstruction or free air. Surgical changes of the lower  lumbar spine are again noted. Diffuse osseous metastases are not well appreciated by plain film radiographs.  IMPRESSION: 1. Stable cardiomegaly without failure. 2. Normal bowel gas pattern.  No acute abnormality of the abdomen. 3. Stable right IJ Port-A-Cath. 4. Bone metastases are not well visualized.   Electronically Signed   By: San Morelle M.D.   On: 04/02/2015 09:12    Scheduled Meds: . ALPRAZolam  0.5 mg Oral BID  . fluconazole  100 mg Oral Daily  . folic acid  1 mg Oral Daily  . gabapentin  400 mg Oral TID  . hydrOXYzine  25 mg Oral Daily  . irbesartan  37.5 mg Oral Daily  .  magnesium hydroxide  30 mL Oral Daily  . megestrol  400 mg Oral Daily  . methylPREDNISolone (SOLU-MEDROL) injection  20 mg Intravenous Q12H  . metoCLOPramide (REGLAN) injection  5 mg Intravenous TID AC & HS  . metoCLOPramide  5 mg Oral TID AC  . morphine  30 mg Oral Q12H  . OXcarbazepine  150 mg Oral TID  . oxyCODONE  10 mg Oral Q4H  . pantoprazole  40 mg Oral Daily  . senna-docusate  2 tablet Oral BID   Continuous Infusions: . dextrose 5 % and 0.9% NaCl 100 mL/hr at 04/03/15 0055    Principal Problem:   Nausea and vomiting Active Problems:   Metastatic primary lung cancer   Nausea & vomiting   Left lateral abdominal pain   DM gastroparesis   Generalized anxiety disorder    Time spent: 45 minutes.     [facility] Pager [phone] If 7PM-7AM, please contact night-coverage at [email or web address], password [employer] 04/03/2015, 4:03 PM  LOS: 1 day

## 2015-04-03 NOTE — Progress Notes (Signed)
Pt received a soap suds enema with small amount of output.

## 2015-04-04 DIAGNOSIS — E43 Unspecified severe protein-calorie malnutrition: Secondary | ICD-10-CM | POA: Insufficient documentation

## 2015-04-04 MED ORDER — PROMETHAZINE-PHENYLEPHRINE 6.25-5 MG/5ML PO SYRP
5.0000 mL | ORAL_SOLUTION | ORAL | Status: DC | PRN
  Filled 2015-04-04: qty 5

## 2015-04-04 MED ORDER — PROMETHAZINE HCL 6.25 MG/5ML PO SYRP
6.2500 mg | ORAL_SOLUTION | ORAL | Status: DC | PRN
  Administered 2015-04-05 (×2): 6.25 mg via ORAL
  Filled 2015-04-04 (×3): qty 5

## 2015-04-04 MED ORDER — PSEUDOEPHEDRINE HCL 30 MG/5ML PO SYRP
30.0000 mg | ORAL_SOLUTION | ORAL | Status: DC | PRN
  Administered 2015-04-05 (×2): 30 mg via ORAL
  Filled 2015-04-04 (×3): qty 5

## 2015-04-04 MED ORDER — HYDROXYZINE HCL 25 MG PO TABS
25.0000 mg | ORAL_TABLET | Freq: Every day | ORAL | Status: DC
  Administered 2015-04-05 – 2015-04-06 (×2): 25 mg via ORAL
  Filled 2015-04-04 (×2): qty 1

## 2015-04-04 MED ORDER — LIP MEDEX EX OINT
TOPICAL_OINTMENT | CUTANEOUS | Status: AC
  Administered 2015-04-04: 12:00:00
  Filled 2015-04-04: qty 7

## 2015-04-04 MED ORDER — GLUCERNA SHAKE PO LIQD
237.0000 mL | Freq: Three times a day (TID) | ORAL | Status: DC
  Administered 2015-04-04 – 2015-04-06 (×5): 237 mL via ORAL
  Filled 2015-04-04 (×8): qty 237

## 2015-04-04 NOTE — Progress Notes (Signed)
Patient complains of difficultly falling asleep because of a persitant cough. Hospitalist paged and made aware and a new order was received.

## 2015-04-04 NOTE — Progress Notes (Signed)
Initial Nutrition Assessment  DOCUMENTATION CODES:  Severe malnutrition in context of chronic illness  INTERVENTION:  Provide Glucerna Shake po TID, each supplement provides 220 kcal and 10 grams of protein Encourage PO intake (small frequent meals) RD to continue to monitor  NUTRITION DIAGNOSIS:  Malnutrition related to chronic illness as evidenced by energy intake < 75% for > or equal to 1 month, moderate depletion of body fat, moderate depletions of muscle mass, percent weight loss.  GOAL:  Patient will meet greater than or equal to 90% of their needs  MONITOR:  PO intake, Supplement acceptance, Labs, Weight trends, Skin, I & O's  REASON FOR ASSESSMENT:  Consult Assessment of nutrition requirement/status  ASSESSMENT: 64 y.o. female  With past mental history of episodes of chronic abdominal pain along with nausea and vomiting in the past this had an extensive workup and usually attributed to gastroparesis. She is also diagnosed in the past year with metastatic lung cancer.  Pt reports poor appetite and PO intake PTA. For the last couple of months (since March), pt has only been eating bananas, jello, applesauce and chopped meats. Pt with chronic N/V. Reviewed soft foods with patient.  RD to monitor for improvement of appetite, pt was ordered Megace.  Pt with 25 lb weight loss which began 09/10/14 (17% weight loss x 7 months, significant for time frame).  Pt willing to try Glucerna shakes even though she states Ensure/Boost cause her to have diarrhea.  Labs reviewed.  Height:  Ht Readings from Last 1 Encounters:  04/02/15 '5\' 4"'$  (1.626 m)    Weight:  Wt Readings from Last 1 Encounters:  04/02/15 125 lb 10.6 oz (57 kg)    Ideal Body Weight:  54.5 kg  Wt Readings from Last 10 Encounters:  04/02/15 125 lb 10.6 oz (57 kg)  03/26/15 133 lb 1.6 oz (60.374 kg)  03/23/15 131 lb 9.6 oz (59.693 kg)  03/16/15 132 lb 6.4 oz (60.056 kg)  03/11/15 137 lb (62.143 kg)   02/26/15 134 lb (60.782 kg)  02/17/15 138 lb 0.1 oz (62.6 kg)  02/11/15 139 lb 9.6 oz (63.322 kg)  11/27/14 147 lb (66.679 kg)  09/10/14 150 lb (68.04 kg)    BMI:  Body mass index is 21.56 kg/(m^2).  Estimated Nutritional Needs:  Kcal:  1600-1800  Protein:  80-90g  Fluid:  1.6L/day    Skin:  Reviewed, no issues  Diet Order:  Diet regular Room service appropriate?: Yes; Fluid consistency:: Thin  EDUCATION NEEDS:  Education needs addressed   Intake/Output Summary (Last 24 hours) at 04/04/15 1400 Last data filed at 04/04/15 0900  Gross per 24 hour  Intake    840 ml  Output      0 ml  Net    840 ml    Last BM:  5/15  Clayton Bibles, MS, RD, LDN Pager: [phone] After Hours Pager: [phone]

## 2015-04-04 NOTE — Discharge Instructions (Signed)

## 2015-04-04 NOTE — Evaluation (Signed)
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    Follow Up Recommendations No PT follow up    Equipment Recommendations  None recommended by PT    Recommendations for Other Services       Precautions / Restrictions Precautions Precautions: None      Mobility  Bed Mobility Overal bed mobility: Independent                Transfers Overall transfer level: Needs assistance Equipment used: Rolling walker (2 wheeled) Transfers: Sit to/from Stand Sit to Stand: Supervision            Ambulation/Gait Ambulation/Gait assistance: Supervision Ambulation Distance (Feet): 250 Feet Assistive device: Rolling walker (2 wheeled)       General Gait Details: no balance loss  Stairs            Wheelchair Mobility    Modified Rankin (Stroke Patients Only)       Balance                                             Pertinent Vitals/Pain Pain Assessment: 0-10 Pain Score: 5  Pain Location: L side Pain Descriptors / Indicators: Aching;Constant Pain Intervention(s): Monitored during session    Home Living Family/patient expects to be discharged to:: Private residence Living Arrangements: Children Available Help at Discharge: Family Type of Home: House       Home Layout: Able to live on main level with bedroom/bathroom Home Equipment: Environmental consultant - 2 wheels;Cane - single point      Prior Function Level of Independence:  Independent with assistive device(s)         Comments: uses assistive device as needed due to pain     Hand Dominance        Extremity/Trunk Assessment   Upper Extremity Assessment: Overall WFL for tasks assessed           Lower Extremity Assessment: Overall WFL for tasks assessed      Cervical / Trunk Assessment: Normal  Communication   Communication: No difficulties  Cognition Arousal/Alertness: Awake/alert Behavior During Therapy: WFL for tasks assessed/performed Overall Cognitive Status: Within Functional Limits for tasks assessed                      General Comments      Exercises        Assessment/Plan    PT Assessment Patient needs continued PT services  PT Diagnosis Difficulty walking   PT Problem List Decreased activity tolerance;Pain  PT Treatment Interventions Gait training;Functional mobility training;Patient/family education   PT Goals (Current goals can be found in the Care Plan section) Acute Rehab PT Goals Patient Stated Goal: to get better PT Goal Formulation: With patient Time For Goal Achievement: 04/11/15 Potential to Achieve Goals: Good    Frequency Min 2X/week   Barriers to discharge        Co-evaluation  End of Session   Activity Tolerance: Patient tolerated treatment well Patient left: in bed;with call bell/phone within reach Nurse Communication: Mobility status         Time: 2162-4469 PT Time Calculation (min) (ACUTE ONLY): 12 min   Charges:   PT Evaluation $Initial PT Evaluation Tier I: 1 Procedure     PT G CodesClaretha Cooper 04/04/2015, 4:58 PM Tresa Endo PT [phone]

## 2015-04-04 NOTE — Progress Notes (Signed)
[facility] PROGRESS NOTE  Kytzia Gienger YIR:[id number] DOB: 1951/10/02 DOA: 04/02/2015 PCP: Harvie Junior, MD  Assessment/Plan: Nausea, vomiting and abdominal pain: Unclear etiology. No ileus or obstruction on abdominal imaging,. Lactic acid within normal limits. Her symtoms appears to have improved. Advanced diet as tolerated. Stool softeners and laxatives as bowel regimen. Reports having had a small BM last night.    Metastatic lung cancer; outpatient follow up with oncology . With mets to axial and appendicular skeleton, pain meds as needed.   Severe Protein energy malnutrition;  Started on megace and nutrition consulted for recommendations.   Mild anemia and thrombocytopenia: possibly from involvement of the bone marrow. Monitor and transfuse as needed. Repeat in am.    Code Status: full code.  Family Communication: none at bedside Disposition Plan: pending , possible home tomorrow if her symptoms resolve.    Consultants:  none  Procedures:  none  Antibiotics:  none  HPI/Subjective: Discussed the results of the PET scan, wants to stay tonight and have a good bowel movement.   Objective: Filed Vitals:   04/04/15 1300  BP: 154/100  Pulse: 89  Temp: 98.3 F (36.8 C)  Resp: 16    Intake/Output Summary (Last 24 hours) at 04/04/15 1549 Last data filed at 04/04/15 1230  Gross per 24 hour  Intake    840 ml  Output      0 ml  Net    840 ml   Filed Weights   04/02/15 1350  Weight: 57 kg (125 lb 10.6 oz)    Exam:   General:  Alert afebrile in  Mild distress frompain all over  Cardiovascular: s1s2  Respiratory: ctab  Abdomen: soft non tender non distended bowel sounds heard  Musculoskeletal: no pedal edema, cyanosis or clubbing.   Data Reviewed: Basic Metabolic Panel:  Recent Labs Lab 04/02/15 0830 04/03/15 0500  NA 138 139  K 3.8 4.0  CL 96* 104  CO2 28 25  GLUCOSE 136* 124*  BUN 11 13  CREATININE 0.69 0.48  CALCIUM 9.4 8.7*    Liver Function Tests:  Recent Labs Lab 04/02/15 0830  AST 23  ALT 13*  ALKPHOS 337*  BILITOT 1.0  PROT 7.2  ALBUMIN 3.8    Recent Labs Lab 04/02/15 0830  LIPASE 11*   No results for input(s): AMMONIA in the last 168 hours. CBC:  Recent Labs Lab 04/02/15 0830 04/03/15 0500  WBC 8.1 6.9  NEUTROABS 6.5  --   HGB 12.2 11.1*  HCT 37.8 34.6*  MCV 90.0 88.7  PLT 112* 116*   Cardiac Enzymes:  Recent Labs Lab 04/02/15 0830  TROPONINI <0.03   BNP (last 3 results) No results for input(s): BNP in the last 8760 hours.  ProBNP (last 3 results) No results for input(s): PROBNP in the last 8760 hours.  CBG:  Recent Labs Lab 04/01/15 1110  GLUCAP 108*    Recent Results (from the past 240 hour(s))  Urine culture     Status: None   Collection Time: 04/02/15  9:55 AM  Result Value Ref Range Status   Specimen Description URINE, CLEAN CATCH  Final   Special Requests NONE  Final   Colony Count   Final    60,000 COLONIES/ML Performed at [facility]    Culture   Final    Multiple bacterial morphotypes present, none predominant. Suggest appropriate recollection if clinically indicated. Performed at [facility]    Report Status 04/03/2015 FINAL  Final  Studies: No results found.  Scheduled Meds: . ALPRAZolam  0.5 mg Oral BID  . feeding supplement (GLUCERNA SHAKE)  237 mL Oral TID BM  . fluconazole  100 mg Oral Daily  . folic acid  1 mg Oral Daily  . gabapentin  400 mg Oral TID  . hydrOXYzine  25 mg Oral Daily  . irbesartan  37.5 mg Oral Daily  . magnesium hydroxide  30 mL Oral Daily  . megestrol  400 mg Oral Daily  . methylPREDNISolone (SOLU-MEDROL) injection  20 mg Intravenous Q12H  . metoCLOPramide (REGLAN) injection  5 mg Intravenous TID AC & HS  . metoCLOPramide  5 mg Oral TID AC  . morphine  30 mg Oral Q12H  . OXcarbazepine  150 mg Oral TID  . oxyCODONE  10 mg Oral Q4H  . pantoprazole  40 mg Oral Daily  . senna-docusate  2  tablet Oral BID   Continuous Infusions:    Principal Problem:   Nausea and vomiting Active Problems:   Metastatic primary lung cancer   Nausea & vomiting   Left lateral abdominal pain   DM gastroparesis   Generalized anxiety disorder   Protein-calorie malnutrition, severe    Time spent: 45 minutes.     [facility] Pager [phone] If 7PM-7AM, please contact night-coverage at [email or web address], password [facility] 04/04/2015, 3:49 PM  LOS: 2 days

## 2015-04-05 ENCOUNTER — Other Ambulatory Visit: Payer: Self-pay

## 2015-04-05 ENCOUNTER — Ambulatory Visit: Payer: Medicare HMO

## 2015-04-05 ENCOUNTER — Encounter: Payer: Self-pay | Admitting: Hematology

## 2015-04-05 ENCOUNTER — Ambulatory Visit
Admission: RE | Admit: 2015-04-05 | Discharge: 2015-04-05 | Disposition: A | Discharge status: Home / Self Care | Payer: Medicare HMO | Source: Ambulatory Visit | Attending: Radiation Oncology | Admitting: Radiation Oncology

## 2015-04-05 ENCOUNTER — Telehealth: Payer: Self-pay | Admitting: Oncology

## 2015-04-05 LAB — CBC
HCT: 32.1 % — ABNORMAL LOW (ref 36.0–46.0)
Hemoglobin: 10.5 g/dL — ABNORMAL LOW (ref 12.0–15.0)
MCH: 29.4 pg (ref 26.0–34.0)
MCHC: 32.7 g/dL (ref 30.0–36.0)
MCV: 89.9 fL (ref 78.0–100.0)
Platelets: 126 10*3/uL — ABNORMAL LOW (ref 150–400)
RBC: 3.57 MIL/uL — AB (ref 3.87–5.11)
RDW: 14.1 % (ref 11.5–15.5)
WBC: 10.1 10*3/uL (ref 4.0–10.5)

## 2015-04-05 NOTE — Progress Notes (Signed)
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  This session amb with out the use of a walker and with out holding to IV poles.   Follow Up Recommendations  No PT follow up     Equipment Recommendations  None recommended by PT (has a cane)    Recommendations for Other Services       Precautions / Restrictions Precautions Precautions: None    Mobility  Bed Mobility Overal bed mobility: Independent                Transfers Overall transfer level: Needs assistance Equipment used: Rolling walker (2 wheeled) Transfers: Sit to/from Stand Sit to Stand: Supervision         General transfer comment: good safety cognition and use of hands to steady self  Ambulation/Gait Ambulation/Gait assistance: Min guard Ambulation Distance (Feet): 500 Feet Assistive device: None Gait Pattern/deviations: Step-to pattern;Step-through pattern Gait velocity: WFL   General Gait Details: this session amb with out the walker and with out pt holding to IV poll to access gait/balance.  Performed well.  No LOB. Stated she uses a cane some times.    Stairs            Wheelchair Mobility    Modified Rankin (Stroke Patients Only)       Balance                                    Cognition Arousal/Alertness: Awake/alert Behavior During Therapy: WFL for tasks assessed/performed Overall Cognitive Status: Within Functional Limits for tasks assessed                      Exercises      General Comments         Pertinent Vitals/Pain Pain Assessment: No/denies pain    Home Living                      Prior Function            PT Goals (current goals can now be found in the care plan section) Progress towards PT goals: Progressing toward goals    Frequency  Min 2X/week    PT Plan      Co-evaluation             End of Session Equipment Utilized During Treatment: Gait belt Activity Tolerance: Patient tolerated treatment well Patient left: in bed;with call bell/phone within reach     Time: 1356-1441 PT Time Calculation (min) (ACUTE ONLY): 45 min  Charges:  $Gait Training: 8-22 mins                    G Codes:      Rica Koyanagi  PTA [facility]  Acute  Rehab Pager      [phone]

## 2015-04-05 NOTE — Telephone Encounter (Signed)
Lynnae Sandhoff, RN on [place] to see if [name] [name] would like radiation treatment today.  Kim asked Jaya, who would like treatment today.  Ok to treat per Dr. Lisbeth Renshaw.  Advised Kristi, RT on linac 1.

## 2015-04-05 NOTE — Telephone Encounter (Signed)
Appointments made and patient will get a new schedule at 5/17 [facility] appointment

## 2015-04-05 NOTE — Progress Notes (Signed)
[facility] PROGRESS NOTE  Jerlean Peralta VQM:[id number] DOB: October 16, 1951 DOA: 04/02/2015 PCP: Harvie Junior, MD  Assessment/Plan: Nausea, vomiting and abdominal pain: Unclear etiology. No ileus or obstruction on abdominal imaging,. Lactic acid within normal limits. Her symtoms appears to have improved. Advanced diet as tolerated. Stool softeners and laxatives as bowel regimen. Reports having had a small BM last night.    Metastatic lung cancer; outpatient follow up with oncology . With mets to axial and appendicular skeleton, pain meds as needed.   Severe Protein energy malnutrition;  Started on megace and nutrition consulted for recommendations.   Mild anemia and thrombocytopenia: possibly from involvement of the bone marrow. Monitor and transfuse as needed. Repeat in am stable.    Code Status: full code.  Family Communication: none at bedside, spoke to her daughter over the phone.  Disposition Plan: pending , possible home tomorrow when  her pain improves.    Consultants:  none  Procedures:  none  Antibiotics:  none  HPI/Subjective: Very upset that we are planning to discharge her, reports pain everywhere, wants to talk to her oncologist.   Objective: Filed Vitals:   04/05/15 1315  BP: 167/88  Pulse: 86  Temp: 98.3 F (36.8 C)  Resp: 16    Intake/Output Summary (Last 24 hours) at 04/05/15 2034 Last data filed at 04/05/15 1315  Gross per 24 hour  Intake    480 ml  Output      0 ml  Net    480 ml   Filed Weights   04/02/15 1350  Weight: 57 kg (125 lb 10.6 oz)    Exam:   General:  Alert afebrile in  Mild distress frompain all over  Cardiovascular: s1s2  Respiratory: ctab  Abdomen: soft non tender non distended bowel sounds heard  Musculoskeletal: no pedal edema, cyanosis or clubbing.   Data Reviewed: Basic Metabolic Panel:  Recent Labs Lab 04/02/15 0830 04/03/15 0500  NA 138 139  K 3.8 4.0  CL 96* 104  CO2 28 25  GLUCOSE  136* 124*  BUN 11 13  CREATININE 0.69 0.48  CALCIUM 9.4 8.7*   Liver Function Tests:  Recent Labs Lab 04/02/15 0830  AST 23  ALT 13*  ALKPHOS 337*  BILITOT 1.0  PROT 7.2  ALBUMIN 3.8    Recent Labs Lab 04/02/15 0830  LIPASE 11*   No results for input(s): AMMONIA in the last 168 hours. CBC:  Recent Labs Lab 04/02/15 0830 04/03/15 0500 04/05/15 0515  WBC 8.1 6.9 10.1  NEUTROABS 6.5  --   --   HGB 12.2 11.1* 10.5*  HCT 37.8 34.6* 32.1*  MCV 90.0 88.7 89.9  PLT 112* 116* 126*   Cardiac Enzymes:  Recent Labs Lab 04/02/15 0830  TROPONINI <0.03   BNP (last 3 results) No results for input(s): BNP in the last 8760 hours.  ProBNP (last 3 results) No results for input(s): PROBNP in the last 8760 hours.  CBG:  Recent Labs Lab 04/01/15 1110  GLUCAP 108*    Recent Results (from the past 240 hour(s))  Urine culture     Status: None   Collection Time: 04/02/15  9:55 AM  Result Value Ref Range Status   Specimen Description URINE, CLEAN CATCH  Final   Special Requests NONE  Final   Colony Count   Final    60,000 COLONIES/ML Performed at [facility]    Culture   Final    Multiple bacterial morphotypes present, none predominant. Suggest appropriate recollection  if clinically indicated. Performed at [facility]    Report Status 04/03/2015 FINAL  Final     Studies: No results found.  Scheduled Meds: . ALPRAZolam  0.5 mg Oral BID  . feeding supplement (GLUCERNA SHAKE)  237 mL Oral TID BM  . fluconazole  100 mg Oral Daily  . folic acid  1 mg Oral Daily  . gabapentin  400 mg Oral TID  . hydrOXYzine  25 mg Oral Daily  . irbesartan  37.5 mg Oral Daily  . magnesium hydroxide  30 mL Oral Daily  . megestrol  400 mg Oral Daily  . methylPREDNISolone (SOLU-MEDROL) injection  20 mg Intravenous Q12H  . metoCLOPramide (REGLAN) injection  5 mg Intravenous TID AC & HS  . metoCLOPramide  5 mg Oral TID AC  . morphine  30 mg Oral Q12H  .  OXcarbazepine  150 mg Oral TID  . oxyCODONE  10 mg Oral Q4H  . pantoprazole  40 mg Oral Daily  . senna-docusate  2 tablet Oral BID   Continuous Infusions:    Principal Problem:   Nausea and vomiting Active Problems:   Metastatic primary lung cancer   Nausea & vomiting   Left lateral abdominal pain   DM gastroparesis   Generalized anxiety disorder   Protein-calorie malnutrition, severe    Time spent: 25 minutes.     [facility] Pager [phone] If 7PM-7AM, please contact night-coverage at [email or web address], password [facility] 04/05/2015, 8:34 PM  LOS: 3 days

## 2015-04-05 NOTE — Progress Notes (Signed)
[facility] Therapy Treatment Record Phone [phone]   Radiation Therapy was administered to [name] [name] on: 04/05/2015  3:02 PM and was treatment # 8out of a planned course of 10 treatments.

## 2015-04-06 ENCOUNTER — Ambulatory Visit: Payer: Medicare HMO

## 2015-04-06 ENCOUNTER — Encounter: Payer: Self-pay | Admitting: Nutrition

## 2015-04-06 ENCOUNTER — Encounter: Payer: Self-pay | Admitting: Radiation Oncology

## 2015-04-06 ENCOUNTER — Ambulatory Visit: Payer: Self-pay

## 2015-04-06 ENCOUNTER — Ambulatory Visit
Admit: 2015-04-06 | Discharge: 2015-04-06 | Disposition: A | Discharge status: Home / Self Care | Payer: Medicare HMO | Attending: Radiation Oncology | Admitting: Radiation Oncology

## 2015-04-06 ENCOUNTER — Ambulatory Visit
Admission: RE | Admit: 2015-04-06 | Discharge: 2015-04-06 | Disposition: A | Discharge status: Home / Self Care | Payer: Medicare HMO | Source: Ambulatory Visit | Attending: Radiation Oncology | Admitting: Radiation Oncology

## 2015-04-06 VITALS — BP 130/84 | HR 72 | Resp 16

## 2015-04-06 DIAGNOSIS — C7951 Secondary malignant neoplasm of bone: Secondary | ICD-10-CM

## 2015-04-06 DIAGNOSIS — R109 Unspecified abdominal pain: Secondary | ICD-10-CM

## 2015-04-06 MED ORDER — MEGESTROL ACETATE 40 MG/ML PO SUSP: 400.0000 mg | Freq: Every day | ORAL | Status: DC

## 2015-04-06 MED ORDER — HEPARIN SOD (PORK) LOCK FLUSH 100 UNIT/ML IV SOLN
500.0000 [IU] | INTRAVENOUS | Status: AC | PRN
  Administered 2015-04-06: 500 [IU]
  Filled 2015-04-06: qty 5

## 2015-04-06 MED ORDER — OXYCODONE HCL ER 30 MG PO T12A: 30.0000 mg | EXTENDED_RELEASE_TABLET | Freq: Two times a day (BID) | ORAL | Status: DC

## 2015-04-06 NOTE — Care Management Note (Signed)
Case Management Note  Patient Details  Name: Sarabella Caprio MRN: [id number] Date of Birth: 03-Aug-1951  Medicare Important Message Given:  Yes Date Medicare IM Given:  04/06/15 Medicare IM give by:  Marney Doctor RN,BSN,NCM Date Additional Medicare IM Given:    Additional Medicare Important Message give by:     IAdditional Comments:  Lynnell Catalan, RN 04/06/2015, 3:10 PM

## 2015-04-06 NOTE — Progress Notes (Signed)
This encounter was created in error - please disregard.

## 2015-04-06 NOTE — Progress Notes (Signed)
Patient d/c home,stable. 

## 2015-04-06 NOTE — Progress Notes (Signed)
  Radiation Oncology         [phone] ________________________________  Name: [name] [name] MRN: [id number]  Date: 04/06/2015  DOB: [birth year]  Weekly Radiation Therapy Management - inpatient  DIAGNOSIS: Metastatic adenocarcinoma to bone likely lung primary  Current Dose: 27 Gy     Planned Dose:  30 Gy  Narrative . . . . . . . . The patient presents for routine under treatment assessment.                                   The patient's pain overall is better. She is admitted to the hospital for management of her problems with nausea and vomiting. This issue is also better area the patient is taking in some by mouth at this time. She is in good spirits today. She has been in the hospital since May 13                                 Set-up films were reviewed.                                 The chart was checked. Physical Findings. . .  blood pressure is 130/84 and her pulse is 72. Her respiration is 16. . The lungs are clear. The heart has a regular rhythm and rate. The abdomen is soft and nontender with normal bowel sounds. Impression . . . . . . . The patient is tolerating radiation. Plan . . . . . . . . . . . . Continue treatment as planned.  ________________________________   Blair Promise, PhD, MD

## 2015-04-06 NOTE — Progress Notes (Signed)
Vitals stable. Pleasant affect noted. Reports a "positive" mood today. Reports sacral pain is less today. Reports yeast infection is resolving. Reports dysuria has resolved. Denies hematuria or vaginal discharge. Reports that she continues to feel weak.

## 2015-04-06 NOTE — Progress Notes (Signed)
Patient d/c instructions and prescriptions given,verbalized understanding,teach back utilized. Daughter at bedside.

## 2015-04-06 NOTE — Discharge Summary (Signed)
Physician Discharge Summary  [name] [name]:[id number] DOB: 1951-10-12 DOA: 04/02/2015  PCP: Harvie Junior, MD  Admit date: 04/02/2015 Discharge date: 04/06/2015  Time spent: 30 minutes  Recommendations for Outpatient Follow-up:  1. Follow up with oncology as recommended  Discharge Diagnoses:  Principal Problem:   Nausea and vomiting Active Problems:   Metastatic primary lung cancer   Nausea & vomiting   Left lateral abdominal pain   DM gastroparesis   Generalized anxiety disorder   Protein-calorie malnutrition, severe   Discharge Condition: improved  Diet recommendation: regular  Filed Weights   04/02/15 1350  Weight: 57 kg (125 lb 10.6 oz)    History of present illness:  [name] [name] is a 64 y.o. female With past mental history of episodes of chronic abdominal pain along with nausea and vomiting in the past , under went extensive work up in the past, presents with similar complaints.   Hospital Course:   Nausea, vomiting and abdominal pain: Unclear etiology. No ileus or obstruction on abdominal imaging,. Lactic acid within normal limits. Her symtoms appears to have improved. Advanced diet as tolerated. Stool softeners and laxatives as bowel regimen. Reports having had a small BM last night.   Metastatic lung cancer; outpatient follow up with oncology . With mets to axial and appendicular skeleton, pain meds as needed.   Severe Protein energy malnutrition;  Started on megace and nutrition consulted for recommendations.   Mild anemia and thrombocytopenia: possibly from involvement of the bone marrow. Monitor and transfuse as needed. Repeat in am stable.  Procedures:  none  Consultations:  Oncology Dr Burr Medico over the phone.   Discharge Exam: Filed Vitals:   04/06/15 1300  BP: 132/88  Pulse: 81  Temp: 98.3 F (36.8 C)  Resp: 16    General: alert afebrile comfortable Cardiovascular: s1s2 Respiratory: ctab  Discharge  Instructions   Discharge Instructions    Diet - low sodium heart healthy    Complete by:  As directed      Discharge instructions    Complete by:  As directed   Follow up with Dr Burr Medico as recommended.          Current Discharge Medication List    START taking these medications   Details  megestrol (MEGACE) 40 MG/ML suspension Take 10 mLs (400 mg total) by mouth daily. Qty: 240 mL, Refills: 0      CONTINUE these medications which have CHANGED   Details  OxyCODONE 30 MG T12A Take 30 mg by mouth every 12 (twelve) hours. Qty: 20 tablet, Refills: 0      CONTINUE these medications which have NOT CHANGED   Details  ALPRAZolam (XANAX) 1 MG tablet Take 0.5 mg by mouth 2 (two) times daily.     Cyanocobalamin (VITAMIN B-12 IJ) Inject as directed every 30 (thirty) days. Administered by Dr. York Ram around the 1st of each month    dexamethasone (DECADRON) 4 MG tablet Take 1 tablet (4 mg total) by mouth daily. Qty: 30 tablet, Refills: 2    dicyclomine (BENTYL) 20 MG tablet Take 20 mg by mouth 3 (three) times daily with meals.    fluconazole (DIFLUCAN) 100 MG tablet Take 1 tablet (100 mg total) by mouth daily. 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 Qty: 30 tablet, Refills: 3    gabapentin (NEURONTIN) 400 MG  capsule Take 400 mg by mouth 3 (three) times daily. Nerve pain    HYDROmorphone (DILAUDID) 2 MG tablet Take 0.5-1 tablets (1-2 mg total) by mouth every 4 (four) hours as needed for severe pain. Qty: 30 tablet, Refills: 0    hydrOXYzine (VISTARIL) 25 MG capsule Take 25 mg by mouth daily.    Ipratropium-Albuterol (COMBIVENT RESPIMAT) 20-100 MCG/ACT AERS respimat Inhale 2 puffs into the lungs daily as needed for wheezing or shortness of breath.     KLOR-CON M20 20 MEQ tablet Take 20 mEq by mouth every morning.  with food Refills: 6    loratadine (CLARITIN) 10 MG tablet Take 10 mg by mouth every morning.     magnesium hydroxide (MILK OF MAGNESIA) 400 MG/5ML suspension Take 5 mLs by mouth daily as needed for mild constipation. Qty: 118 mL, Refills: 0    Menthol-Methyl Salicylate (MUSCLE RUB) 10-15 % CREA Apply 1 application topically daily as needed for muscle pain. Left shoulder pain    metoCLOPramide (REGLAN) 5 MG tablet Take 1 tablet (5 mg total) by mouth 3 (three) times daily before meals. Qty: 30 tablet, Refills: 2    metoprolol tartrate (LOPRESSOR) 25 MG tablet Take 25 mg by mouth daily.    olmesartan (BENICAR) 40 MG tablet Take 1 tablet (40 mg total) by mouth daily. Qty: 30 tablet, Refills: 2    omeprazole (PRILOSEC) 20 MG capsule Take 1 capsule (20 mg total) by mouth 2 (two) times daily before a meal. 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     OXcarbazepine (TRILEPTAL) 150 MG tablet Take 150 mg by mouth 3 (three) times daily.     Oxycodone HCl 10 MG TABS Take 1-2 tablet (10 mg total) by mouth every 4 (four) hours as needed for moderate pain. Qty: 90 tablet, Refills: 0   Associated Diagnoses: Malignant neoplasm of upper lobe of lung, unspecified laterality    polyethylene glycol (MIRALAX / GLYCOLAX) packet Take 17 g by mouth 2 (two) times daily as needed for mild constipation or moderate constipation.    senna-docusate (SENOKOT-S) 8.6-50 MG per tablet Take 2 tablets by mouth 2 (two) times daily. Qty: 30 tablet, Refills: 2    simvastatin (ZOCOR) 20 MG tablet Take 20 mg by mouth at bedtime.     tiZANidine (ZANAFLEX) 4 MG tablet Take 4 mg by mouth every 8 (eight) hours as needed for muscle spasms.    triamcinolone cream (KENALOG) 0.1 % Apply 1 application topically 2  (two) times daily as needed (for rash).    Associated Diagnoses: Malignant neoplasm of upper lobe of right lung    Vitamin D, Ergocalciferol, (DRISDOL) 50000 UNITS CAPS capsule Take 50,000 Units by mouth every Monday.       STOP taking these medications     metFORMIN (GLUCOPHAGE) 500 MG tablet      morphine (MS CONTIN) 30 MG 12 hr tablet        Allergies  Allergen Reactions  . Haldol [Haloperidol Decanoate] Other (See Comments)    tongue swelling  . Acetaminophen Other (See Comments)    Stomach pain  . Ibuprofen Other (See Comments)    Stomach pain  . Metronidazole Other (See Comments)    Severe stomach upset--was instructed to avoid Flagyl.  . Morphine And Related Hives  Tolerates hydromorphone.  Marland Kitchen Penicillins Rash   Follow-up Information    Follow up with Harvie Junior, MD. Schedule an appointment as soon as possible for a visit in 1 week.   Specialty:  Specialist   Contact information:   [address] [place] [address] [phone]        The results of significant diagnostics from this hospitalization (including imaging, microbiology, ancillary and laboratory) are listed below for reference.    Significant Diagnostic Studies: Nm Pet Image Restag (ps) Skull Base To Thigh  04/01/2015   CLINICAL DATA:  Subsequent treatment strategy for lung cancer. Restaging examination.  EXAM: NUCLEAR MEDICINE PET SKULL BASE TO THIGH  TECHNIQUE: 6.6 mCi F-18 FDG was injected intravenously. Full-ring PET imaging was performed from the skull base to thigh after the radiotracer. CT data was obtained and used for attenuation correction and anatomic localization.  FASTING BLOOD GLUCOSE:  Value: 108 mg/dl  COMPARISON:  PET-CT 11/21/2012. Chest CT 02/24/2015. CT of the abdomen and pelvis 02/17/2015.  FINDINGS: NECK  No hypermetabolic lymph nodes in the neck.  CHEST  Status post right upper lobectomy. No hypermetabolic mediastinal or hilar nodes. No suspicious pulmonary nodules on  the CT scan. Right internal jugular single-lumen porta cath with tip terminating in the right atrium.  ABDOMEN/PELVIS  No abnormal hypermetabolic activity within the liver, pancreas, adrenal glands, or spleen. No hypermetabolic lymph nodes in the abdomen or pelvis. No significant volume of ascites. No pneumoperitoneum. No pathologic distention of small bowel. Normal appendix. Mild atherosclerosis throughout the abdominal and pelvic vasculature, without definite aneurysm.  SKELETON  Innumerable mixed lytic and sclerotic hypermetabolic lesions throughout the visualized axial and appendicular skeleton, compatible with widespread metastatic disease. Specific examples include T8 (SUVmax = 8.0), L2 (SUVmax = 8.0), and the right ilium (SUVmax = 8.5). Lumbar rod and screw fixation device on the left side at L4-L5.  IMPRESSION: 1. Widespread metastatic disease throughout the axial and appendicular skeleton. 2. Status post right upper lobectomy. No evidence to suggest local recurrence of disease or non osseous metastatic disease in the neck, chest, abdomen or pelvis. 3. Additional incidental findings, as above.   Electronically Signed   By: Vinnie Langton M.D.   On: 04/01/2015 13:02   Ir Fluoro Guide Cv Line Right  03/09/2015   CLINICAL DATA:  METASTATIC LUNG CANCER  EXAM: RIGHT INTERNAL JUGULAR SINGLE LUMEN POWER PORT CATHETER INSERTION  Date:  4/19/20164/19/2016 3:51 pm  Radiologist:  M. Daryll Brod, MD  Guidance:  Ultrasound fluoroscopic  FLUOROSCOPY TIME:  30 seconds, 3 mGy  MEDICATIONS AND MEDICAL HISTORY: [name] already receiving vancomycin earlier today. This was reviewed with pharmacy. No further antibiotics were necessary.  2 mg Versed  ANESTHESIA/SEDATION: 28 minutes  CONTRAST:  None.  COMPLICATIONS: None immediate  PROCEDURE: Informed consent was obtained from the patient following explanation of the procedure, risks, benefits and alternatives. The patient understands, agrees and consents for the  procedure. All questions were addressed. A time out was performed.  Maximal barrier sterile technique utilized including caps, mask, sterile gowns, sterile gloves, large sterile drape, hand hygiene, and 2% chlorhexidine scrub.  Under sterile conditions and local anesthesia, right internal jugular micropuncture venous access was performed. Access was performed with ultrasound. Images were obtained for documentation. A guide wire was inserted followed by a transitional dilator. This allowed insertion of a guide wire and catheter into the IVC. Measurements were obtained from the SVC / RA junction back to the right IJ venotomy site. In the  right infraclavicular chest, a subcutaneous pocket was created over the second anterior rib. This was done under sterile conditions and local anesthesia. 1% lidocaine with epinephrine was utilized for this. A 2.5 cm incision was made in the skin. Blunt dissection was performed to create a subcutaneous pocket over the right pectoralis major muscle. The pocket was flushed with saline vigorously. There was adequate hemostasis. The port catheter was assembled and checked for leakage. The port catheter was secured in the pocket with two retention sutures. The tubing was tunneled subcutaneously to the right venotomy site and inserted into the SVC/RA junction through a valved peel-away sheath. Position was confirmed with fluoroscopy. Images were obtained for documentation. The patient tolerated the procedure well. No immediate complications. Incisions were closed in a two layer fashion with 4 - 0 Vicryl suture. Dermabond was applied to the skin. The port catheter was accessed, blood was aspirated followed by saline and heparin flushes. Needle was removed. A dry sterile dressing was applied.  IMPRESSION: Ultrasound and fluoroscopically guided right internal jugular single lumen power port catheter insertion. Tip in the SVC/RA junction. Catheter ready for use.   Electronically Signed   By: Jerilynn Mages.   Shick M.D.   On: 03/09/2015 16:23   Ir US Guide Vasc Access Right  03/09/2015   CLINICAL DATA:  METASTATIC LUNG CANCER  EXAM: RIGHT INTERNAL JUGULAR SINGLE LUMEN POWER PORT CATHETER INSERTION  Date:  4/19/20164/19/2016 3:51 pm  Radiologist:  M. Daryll Brod, MD  Guidance:  Ultrasound fluoroscopic  FLUOROSCOPY TIME:  30 seconds, 3 mGy  MEDICATIONS AND MEDICAL HISTORY: [name] already receiving vancomycin earlier today. This was reviewed with pharmacy. No further antibiotics were necessary.  2 mg Versed  ANESTHESIA/SEDATION: 28 minutes  CONTRAST:  None.  COMPLICATIONS: None immediate  PROCEDURE: Informed consent was obtained from the patient following explanation of the procedure, risks, benefits and alternatives. The patient understands, agrees and consents for the procedure. All questions were addressed. A time out was performed.  Maximal barrier sterile technique utilized including caps, mask, sterile gowns, sterile gloves, large sterile drape, hand hygiene, and 2% chlorhexidine scrub.  Under sterile conditions and local anesthesia, right internal jugular micropuncture venous access was performed. Access was performed with ultrasound. Images were obtained for documentation. A guide wire was inserted followed by a transitional dilator. This allowed insertion of a guide wire and catheter into the IVC. Measurements were obtained from the SVC / RA junction back to the right IJ venotomy site. In the right infraclavicular chest, a subcutaneous pocket was created over the second anterior rib. This was done under sterile conditions and local anesthesia. 1% lidocaine with epinephrine was utilized for this. A 2.5 cm incision was made in the skin. Blunt dissection was performed to create a subcutaneous pocket over the right pectoralis major muscle. The pocket was flushed with saline vigorously. There was adequate hemostasis. The port catheter was assembled and checked for leakage. The port catheter was secured in the  pocket with two retention sutures. The tubing was tunneled subcutaneously to the right venotomy site and inserted into the SVC/RA junction through a valved peel-away sheath. Position was confirmed with fluoroscopy. Images were obtained for documentation. The patient tolerated the procedure well. No immediate complications. Incisions were closed in a two layer fashion with 4 - 0 Vicryl suture. Dermabond was applied to the skin. The port catheter was accessed, blood was aspirated followed by saline and heparin flushes. Needle was removed. A dry sterile dressing was applied.  IMPRESSION: Ultrasound and  fluoroscopically guided right internal jugular single lumen power port catheter insertion. Tip in the SVC/RA junction. Catheter ready for use.   Electronically Signed   By: Jerilynn Mages.  Shick M.D.   On: 03/09/2015 16:23   Dg Abd Acute W/chest  04/02/2015   CLINICAL DATA:  Lung cancer and bone metastases. Generalized mid abdominal pain. Emesis.  EXAM: DG ABDOMEN ACUTE W/ 1V CHEST  COMPARISON:  PET scan 04/01/2015.  FINDINGS: Mild cardiomegaly is present without failure. A right IJ Port-A-Cath is stable in position. No focal airspace disease is present.  Supine and decubitus views the abdomen demonstrating normal bowel gas pattern. There is no evidence for obstruction or free air. Surgical changes of the lower lumbar spine are again noted. Diffuse osseous metastases are not well appreciated by plain film radiographs.  IMPRESSION: 1. Stable cardiomegaly without failure. 2. Normal bowel gas pattern.  No acute abnormality of the abdomen. 3. Stable right IJ Port-A-Cath. 4. Bone metastases are not well visualized.   Electronically Signed   By: San Morelle M.D.   On: 04/02/2015 09:12   Dg Hips Bilat With Pelvis 3-4 Views  03/24/2015   CLINICAL DATA:  64 year old female with bilateral hip pain since last night. No known injury. Initial encounter.  EXAM: BILATERAL HIP (WITH PELVIS) 3-4 VIEWS  COMPARISON:  CT Abdomen and  Pelvis 02/17/2015.  FINDINGS: Chronic postoperative changes at the lumbosacral junction. Left unilateral transpedicular hardware re- identified. To the pelvis appears stable and intact. SI joints appear within normal limits. Femoral heads are normally located.  The proximal right femur is intact. Right hip joint space is normal for age.  The proximal left femur is intact. The left hip joint space is normal for age.  IMPRESSION: No acute osseous abnormality identified. Negative for age radiographic appearance of both hips.   Electronically Signed   By: Genevie Ann M.D.   On: 03/24/2015 15:20    Microbiology: Recent Results (from the past 240 hour(s))  Urine culture     Status: None   Collection Time: 04/02/15  9:55 AM  Result Value Ref Range Status   Specimen Description URINE, CLEAN CATCH  Final   Special Requests NONE  Final   Colony Count   Final    60,000 COLONIES/ML Performed at [facility]    Culture   Final    Multiple bacterial morphotypes present, none predominant. Suggest appropriate recollection if clinically indicated. Performed at [facility]    Report Status 04/03/2015 FINAL  Final     Labs: Basic Metabolic Panel:  Recent Labs Lab 04/02/15 0830 04/03/15 0500  NA 138 139  K 3.8 4.0  CL 96* 104  CO2 28 25  GLUCOSE 136* 124*  BUN 11 13  CREATININE 0.69 0.48  CALCIUM 9.4 8.7*   Liver Function Tests:  Recent Labs Lab 04/02/15 0830  AST 23  ALT 13*  ALKPHOS 337*  BILITOT 1.0  PROT 7.2  ALBUMIN 3.8    Recent Labs Lab 04/02/15 0830  LIPASE 11*   No results for input(s): AMMONIA in the last 168 hours. CBC:  Recent Labs Lab 04/02/15 0830 04/03/15 0500 04/05/15 0515  WBC 8.1 6.9 10.1  NEUTROABS 6.5  --   --   HGB 12.2 11.1* 10.5*  HCT 37.8 34.6* 32.1*  MCV 90.0 88.7 89.9  PLT 112* 116* 126*   Cardiac Enzymes:  Recent Labs Lab 04/02/15 0830  TROPONINI <0.03   BNP: BNP (last 3 results) No results for input(s): BNP in  the  last 8760 hours.  ProBNP (last 3 results) No results for input(s): PROBNP in the last 8760 hours.  CBG:  Recent Labs Lab 04/01/15 1110  GLUCAP 108*       Signed:  [place]  [facility] 04/06/2015, 3:40 PM

## 2015-04-06 NOTE — Progress Notes (Signed)
[facility] Therapy Treatment Record Phone [phone]   Radiation Therapy was administered to [name] [name] on: 04/06/2015  11:41 AM and was treatment # 9 out of a planned course of 10 treatments.

## 2015-04-07 ENCOUNTER — Ambulatory Visit: Payer: Medicare HMO

## 2015-04-07 ENCOUNTER — Encounter: Payer: Self-pay | Admitting: Radiation Oncology

## 2015-04-07 ENCOUNTER — Other Ambulatory Visit: Payer: Self-pay | Admitting: *Deleted

## 2015-04-07 ENCOUNTER — Ambulatory Visit
Admission: RE | Admit: 2015-04-07 | Discharge: 2015-04-07 | Disposition: A | Discharge status: Home / Self Care | Payer: Medicare HMO | Source: Ambulatory Visit | Attending: Radiation Oncology | Admitting: Radiation Oncology

## 2015-04-07 DIAGNOSIS — Z87891 Personal history of nicotine dependence: Secondary | ICD-10-CM | POA: Diagnosis not present

## 2015-04-07 DIAGNOSIS — C7951 Secondary malignant neoplasm of bone: Secondary | ICD-10-CM | POA: Diagnosis not present

## 2015-04-07 DIAGNOSIS — I10 Essential (primary) hypertension: Secondary | ICD-10-CM | POA: Diagnosis not present

## 2015-04-07 DIAGNOSIS — M542 Cervicalgia: Secondary | ICD-10-CM | POA: Diagnosis not present

## 2015-04-07 DIAGNOSIS — M545 Low back pain: Secondary | ICD-10-CM | POA: Diagnosis not present

## 2015-04-07 DIAGNOSIS — G8929 Other chronic pain: Secondary | ICD-10-CM | POA: Diagnosis not present

## 2015-04-07 DIAGNOSIS — Z510 Encounter for antineoplastic radiation therapy: Secondary | ICD-10-CM | POA: Diagnosis present

## 2015-04-07 DIAGNOSIS — M25519 Pain in unspecified shoulder: Secondary | ICD-10-CM | POA: Diagnosis not present

## 2015-04-07 DIAGNOSIS — Z7951 Long term (current) use of inhaled steroids: Secondary | ICD-10-CM | POA: Diagnosis not present

## 2015-04-07 DIAGNOSIS — E119 Type 2 diabetes mellitus without complications: Secondary | ICD-10-CM | POA: Diagnosis not present

## 2015-04-07 DIAGNOSIS — J449 Chronic obstructive pulmonary disease, unspecified: Secondary | ICD-10-CM | POA: Diagnosis not present

## 2015-04-07 DIAGNOSIS — Z79891 Long term (current) use of opiate analgesic: Secondary | ICD-10-CM | POA: Diagnosis not present

## 2015-04-07 DIAGNOSIS — Z902 Acquired absence of lung [part of]: Secondary | ICD-10-CM | POA: Diagnosis not present

## 2015-04-07 DIAGNOSIS — Z85118 Personal history of other malignant neoplasm of bronchus and lung: Secondary | ICD-10-CM | POA: Diagnosis not present

## 2015-04-07 DIAGNOSIS — Z794 Long term (current) use of insulin: Secondary | ICD-10-CM | POA: Diagnosis not present

## 2015-04-07 NOTE — Telephone Encounter (Signed)
[name] [name] came in with prescription that was written by MD when she was discharged from the hospital yesterday (04/06/15)   States that her pharmacy/insurance wouldn't fill it unless the rewrote that Rx.  When I spoke to Dr Burr Medico she stated that she had given her a Rx the day before she went into the hospital.  The original prescription was for Oxycodone 20 mg take one by mouth every 12 hours.  The Rx from the hospital was for 30 mg by mouth every 12 hours.  Because of the change in dose, the pharmacy would not fill it.   Dr Burr Medico instructed her to use the 20 mg tablets and not get the other prescription filled.  I destroyed the prescription for 30 mg. Patient states understanding of instructions and will see Dr Burr Medico next week as scheduled and will discuss the pain medication at that time.

## 2015-04-08 ENCOUNTER — Ambulatory Visit: Payer: Medicare HMO

## 2015-04-11 NOTE — Progress Notes (Signed)
  Radiation Oncology         [phone] ________________________________  Name: Breniya Goertzen MRN: [id number]  Date: 04/07/2015  DOB: 1951-03-18  End of Treatment Note  ICD-9-CM ICD-10-CM     1. 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   She did have improvement in her left pelvic pain at the completion of therapy.  No problems with nausea or diarrhea  Plan: The patient has completed radiation treatment. The patient will return to radiation oncology clinic for routine followup in one month. I advised them to call or return sooner if they have any questions or concerns related to their recovery or treatment.  -----------------------------------  Blair Promise, PhD, MD

## 2015-04-14 ENCOUNTER — Other Ambulatory Visit: Payer: Medicare HMO

## 2015-04-14 ENCOUNTER — Telehealth: Payer: Self-pay | Admitting: Hematology

## 2015-04-14 ENCOUNTER — Other Ambulatory Visit: Payer: Self-pay

## 2015-04-14 ENCOUNTER — Encounter: Payer: Self-pay | Admitting: Hematology

## 2015-04-14 ENCOUNTER — Ambulatory Visit (HOSPITAL_BASED_OUTPATIENT_CLINIC_OR_DEPARTMENT_OTHER): Payer: Medicare HMO | Admitting: Hematology

## 2015-04-14 ENCOUNTER — Ambulatory Visit (HOSPITAL_COMMUNITY): Payer: Medicare HMO

## 2015-04-14 ENCOUNTER — Telehealth: Payer: Self-pay | Admitting: *Deleted

## 2015-04-14 VITALS — BP 162/78 | HR 84 | Temp 98.8°F | Resp 18 | Ht 64.0 in | Wt 130.3 lb

## 2015-04-14 DIAGNOSIS — I10 Essential (primary) hypertension: Secondary | ICD-10-CM | POA: Diagnosis not present

## 2015-04-14 DIAGNOSIS — E119 Type 2 diabetes mellitus without complications: Secondary | ICD-10-CM | POA: Diagnosis not present

## 2015-04-14 DIAGNOSIS — C3411 Malignant neoplasm of upper lobe, right bronchus or lung: Secondary | ICD-10-CM | POA: Diagnosis not present

## 2015-04-14 DIAGNOSIS — G893 Neoplasm related pain (acute) (chronic): Secondary | ICD-10-CM

## 2015-04-14 DIAGNOSIS — C7951 Secondary malignant neoplasm of bone: Secondary | ICD-10-CM | POA: Diagnosis not present

## 2015-04-14 DIAGNOSIS — C3410 Malignant neoplasm of upper lobe, unspecified bronchus or lung: Secondary | ICD-10-CM

## 2015-04-14 MED ORDER — OXYCODONE HCL 10 MG PO TABS: ORAL_TABLET | ORAL | Status: DC

## 2015-04-14 MED ORDER — OXYCODONE HCL ER 30 MG PO T12A: EXTENDED_RELEASE_TABLET | ORAL | Status: DC

## 2015-04-14 NOTE — Telephone Encounter (Signed)
Called [facility] to schedule doppler.  Scheduled for 4 pm today & pt given instructions to go to [place] [employer] @ 3:45 pm to register.

## 2015-04-14 NOTE — Progress Notes (Signed)
.  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   02/24/2015 Imaging CT chest, abdomen and pelvis showed diffuse bone metastasis throughout the whole spine, sternum, and some of the ribs, skull, and pelvis. brain MRI was negative for metastasis   03/04/2015 Pathology Results left iliac crest lytic bone lesion biopsy showed metastatic adenocarcinoma with abundant mucin, the morphology and IHC are consistent with her prior lung adenocarcinoma.   03/08/2015 - 03/12/2015 Hospital Admission she was admitted for intractable nausea, vomiting, and worsening pain. She was seen by radiation oncologist Dr. Sondra Come during her stay, and her pain medication was adjusted.   03/19/2015 - 04/07/2015 Radiation Therapy palliative radiation to left pelvis, 30 gray in 10 fractions    Metastatic primary lung cancer   02/11/2015 Initial Diagnosis Metastatic primary lung cancer     HISTORY OF PRESENTING ILLNESS:  [name] [name] 64 y.o. female  with multiple comorbidities including untreatedihepatitis C , stage 1 right lung cancer status post lobectomy in 2014 , who was referred to our clinic because of  her recent abnormal MRI lumbar spine findings, which is highly suspicious for metastatic cancer.  She had lumber spine infusion and screws placement in sep 2014 for her back pain, which improved after surgery but she has had some residual back  pain since then but was tolerable. She reprots worseing back for the past 4-5 month, and it bacame unbearable in the past few weeks and she presented to ED on 02/02/15. Lumber MRI was done which showed diffuse bone lesions which was highly syuspecious for metastases. She also developed worsening left leg weakness in the past 2 months, able to walk independently, and mild numbness at left foot. She has been limping lately due to the back pain, no urine or stool incontinuce. She takes oxycodone twice daily which was prescribed by her primary care physician.    She lost some of appetie, lost 15 lb in the last year, No fever or chills. (+) cough with yellow sputum, and some chest discomfort, no dyspnea on exertion.  She lives with her son, she is able to take care of herself, but not much else. She spends most of time sitting or laying down during daytime.   Her last mammogram was one years ago, last colonoscopy one years ago,  which were all normal normal per patient.   INTERIM HISTORY: She returns for follow-up. She was admitted to hospital again on 04/04/2015 for nausea vomiting and pain. She was discharged to home 2 days later. She finished palliative radiation on May 18. She tolerated radiation well, and feels that pelvic pain has improved, although she still complains of diffuse body pain, for which she is taking OxyContin 20 mg every 12 hours, and oxycodone 10-20 mg every 3-4 hours. Her appetite is better this week, her energy level is fair, no significant nausea or vomiting, no fever or chills. She noticed her post feet has swelling up lately, and reports some skin rashes and bruises on  both side. No other bleeding.   MEDICAL HISTORY:  Past Medical History  Diagnosis Date  . Hypertension   . Bipolar 1 disorder   . Heart murmur   . Sciatica   . Arthritis   . Anxiety   . Diverticulosis   . Chronic low back pain   . DDD (degenerative disc disease), lumbar   . Chronic shoulder pain   . Chronic  neck pain   . Hepatitis B     Unclear when initially diagnosed, labs in Epic from 03/09/13  . Hepatitis C     Unclear when initially diagnosed, labs in Epic from 03/09/13  . C. difficile diarrhea     April and February 2014  . COPD (chronic obstructive pulmonary disease)   . GERD (gastroesophageal reflux disease)   . H/O hiatal hernia   . Headache(784.0)     hx  . PONV (postoperative nausea and vomiting)   . Seizures     on meds  . Diabetes mellitus   . Adenocarcinoma of lung, stage 1     SURGICAL HISTORY: Past Surgical History  Procedure Laterality Date  . Facial tumor removal Right 2000  . Brain tumor removal  09  . Brain surgery      in [place]  . Video bronchoscopy  12/04/2012    Procedure: VIDEO BRONCHOSCOPY;  Surgeon: Grace Isaac, MD;  Location[facility] OR;  Service: Thoracic;  Laterality: N/A;  . Video assisted thoracoscopy (vats)/wedge resection  12/04/2012    Procedure: VIDEO ASSISTED THORACOSCOPY (VATS)/WEDGE RESECTION;  Surgeon: Grace Isaac, MD;  Location: [place];  Service: Thoracic;  Laterality: Right;  . Lobectomy  12/04/2012    Procedure: LOBECTOMY;  Surgeon: Grace Isaac, MD;  Location: [place];  Service: Thoracic;  Laterality: Right;  completion of right upper lobectomy and lymph node disection, placement of on q pump  . Tubal ligation    . Tonsillectomy    . Anterior cervical decomp/discectomy fusion N/A 03/05/2014    Procedure: ANTERIOR CERVICAL DECOMPRESSION/DISCECTOMY FUSION 2 LEVELS;  Surgeon: Sinclair Ship, MD;  Location: [place];  Service: Orthopedics;  Laterality: N/A;  Anterior cervical decompression fusion, cervical 4-5, cervical 5-6 with instrumentation, allograft.    SOCIAL HISTORY: History   Social History  . Marital Status: Widowed    Spouse Name: N/A  . Number of Children: 5  . Years of Education: N/A   Occupational History  . n/a     patient draws [employer]   Social History Main Topics  . Smoking status: Former Smoker --  0.25 packs/day for 10 years    Types: Cigarettes    Quit date: 07/07/2013  . Smokeless tobacco: Never Used     Comment: Smokes pk q 3 days   marajuna aug  . Alcohol Use: No     Comment: quit 39yr ago  . Drug Use: Yes    Special: Hydrocodone, Marijuana     Comment: "maybe about once a month"  . Sexual Activity: No   Other Topics Concern  . Not on file   Social History Narrative    FAMILY HISTORY: Family History  Problem Relation Age of Onset  . Hypertension Father     deceased  . Diabetes Father   . Heart disease Father   . Hyperlipidemia Father   . Hypertension Mother   . Hyperlipidemia Mother   . Diabetes Mother   . Hypertension Sister   . Hyperlipidemia Sister   . Hypertension Brother   .  Hyperlipidemia Brother     ALLERGIES:  is allergic to haldol; acetaminophen; ibuprofen; metronidazole; morphine and related; and penicillins.  MEDICATIONS:  Current Outpatient Prescriptions  Medication Sig Dispense Refill  . ALPRAZolam (XANAX) 1 MG tablet Take 0.5 mg by mouth 2 (two) times daily.     . Cyanocobalamin (VITAMIN B-12 IJ) Inject as directed every 30 (thirty) days. Administered by Dr. York Ram around the 1st of each month    . dexamethasone (DECADRON) 4 MG tablet Take 1 tablet (4 mg total) by mouth daily. 30 tablet 2  . dicyclomine (BENTYL) 20 MG tablet Take 20 mg by mouth 3 (three) times daily with meals.    . fluconazole (DIFLUCAN) 100 MG tablet Take 1 tablet (100 mg total) by mouth daily. 28 tablet 0  . folic acid (FOLVITE) 1 MG tablet Take 1 tablet (1 mg total) by mouth daily. 30 tablet 3  . gabapentin (NEURONTIN) 400 MG capsule Take 400 mg by mouth 3 (three) times daily. Nerve pain    . HYDROmorphone (DILAUDID) 2 MG tablet Take 0.5-1 tablets (1-2 mg total) by mouth every 4 (four) hours as needed for severe pain. 30 tablet 0  . hydrOXYzine (VISTARIL) 25 MG capsule Take 25 mg by mouth daily.    . Ipratropium-Albuterol (COMBIVENT RESPIMAT) 20-100 MCG/ACT AERS  respimat Inhale 2 puffs into the lungs daily as needed for wheezing or shortness of breath.     Marland Kitchen KLOR-CON M20 20 MEQ tablet Take 20 mEq by mouth every morning. with food  6  . loratadine (CLARITIN) 10 MG tablet Take 10 mg by mouth every morning.     . magnesium hydroxide (MILK OF MAGNESIA) 400 MG/5ML suspension Take 5 mLs by mouth daily as needed for mild constipation. 118 mL 0  . megestrol (MEGACE) 40 MG/ML suspension Take 10 mLs (400 mg total) by mouth daily. 240 mL 0  . Menthol-Methyl Salicylate (MUSCLE RUB) 10-15 % CREA Apply 1 application topically daily as needed for muscle pain. Left shoulder pain    . metoCLOPramide (REGLAN) 5 MG tablet Take 1 tablet (5 mg total) by mouth 3 (three) times daily before meals. 30 tablet 2  . metoprolol tartrate (LOPRESSOR) 25 MG tablet Take 25 mg by mouth daily.    Marland Kitchen olmesartan (BENICAR) 40 MG tablet Take 1 tablet (40 mg total) by mouth daily. 30 tablet 2  . omeprazole (PRILOSEC) 20 MG capsule Take 1 capsule (20 mg total) by mouth 2 (two) times daily before a meal. 60 capsule 5  . ondansetron (ZOFRAN-ODT) 8 MG disintegrating tablet Take 1 tablet by mouth every 6 (six) hours as needed for nausea or vomiting.     . OXcarbazepine (TRILEPTAL) 150 MG tablet Take 150 mg by mouth 3 (three) times daily.     . OxyCODONE 30 MG T12A Take 30 mg by mouth every 12 (twelve) hours. 20 tablet 0  . Oxycodone HCl 10 MG TABS Take 1-2 tablet (10 mg total) by mouth every 4 (four) hours as needed for moderate pain. 90 tablet 0  . polyethylene glycol (MIRALAX / GLYCOLAX) packet Take 17 g by mouth 2 (two) times daily as needed for mild constipation or moderate constipation.    . senna-docusate (SENOKOT-S) 8.6-50 MG per tablet Take 2 tablets by mouth 2 (two) times daily. 30 tablet 2  . simvastatin (ZOCOR) 20 MG tablet Take 20 mg by mouth at bedtime.     Marland Kitchen tiZANidine (ZANAFLEX) 4 MG tablet Take 4 mg by mouth every 8 (eight) hours  as needed for muscle spasms.    Marland Kitchen triamcinolone cream  (KENALOG) 0.1 % Apply 1 application topically 2 (two) times daily as needed (for rash).     . Vitamin D, Ergocalciferol, (DRISDOL) 50000 UNITS CAPS capsule Take 50,000 Units by mouth every Monday.      No current facility-administered medications for this visit.    REVIEW OF SYSTEMS:   Constitutional: Denies fevers, chills or abnormal night sweats, (+) malaise and weight loss  Eyes: Denies blurriness of vision, double vision or watery eyes Ears, nose, mouth, throat, and face: Denies mucositis or sore throat Respiratory:(+) cpugh, no dyspnea or wheezes Cardiovascular: Denies palpitation, chest discomfort or lower extremity swelling Gastrointestinal:  Denies nausea, heartburn or change in bowel habits Skin: Denies abnormal skin rashes Lymphatics: Denies new lymphadenopathy or easy bruising Neurological:Denies numbness, tingling or new weaknesses Behavioral/Psych: Mood is stable, no new changes  All other systems were reviewed with the patient and are negative.  PHYSICAL EXAMINATION: ECOG PERFORMANCE STATUS: 1-2  Filed Vitals:   04/14/15 1321  BP: 162/78  Pulse: 84  Temp: 98.8 F (37.1 C)  Resp: 18   Filed Weights   04/14/15 1321  Weight: 130 lb 4.8 oz (59.104 kg)    GENERAL:alert, distress due to nausea and pain. She was moaning and turning in the recliner in the clinic, persistent nausea and vomiting throughout her visit. SKIN: skin color, texture, turgor are normal, no rashes or significant lesions, except small ecchymosis and pinkish rash on both in the thigh. EYES: normal, conjunctiva are pink and non-injected, sclera clear OROPHARYNX:no exudate, no erythema and lips, buccal mucosa, and tongue normal  NECK: supple, thyroid normal size, non-tender, without nodularity LYMPH:  no palpable lymphadenopathy in the cervical, axillary or inguinal LUNGS: clear to auscultation and percussion with normal breathing effort HEART: regular rate & rhythm and no murmurs and no lower  extremity edema ABDOMEN:abdomen soft, non-tender and normal bowel sounds Musculoskeletal:no cyanosis of digits and no clubbing no significant tenderness on spine  PSYCH: alert & oriented x 3 with fluent speech NEURO: no focal motor/sensory deficits LEG: (+) Pitting edema on both feet, (+) calf tendenss    LABORATORY DATA:  I have reviewed the data as listed Lab Results  Component Value Date   WBC 10.1 04/05/2015   HGB 10.5* 04/05/2015   HCT 32.1* 04/05/2015   MCV 89.9 04/05/2015   PLT 126* 04/05/2015    Recent Labs  03/23/15 1238 03/24/15 1237 04/02/15 0830 04/03/15 0500  NA 141 140 138 139  K 4.1 4.3 3.8 4.0  CL  --  103 96* 104  CO2 23 28 28 25   GLUCOSE 143* 123* 136* 124*  BUN 17.6 18 11 13   CREATININE 0.8 0.63 0.69 0.48  CALCIUM 9.2 8.9 9.4 8.7*  GFRNONAA  --  >60 >60 >60  GFRAA  --  >60 >60 >60  PROT 6.7 6.8 7.2  --   ALBUMIN 3.8 3.9 3.8  --   AST 23 26 23   --   ALT 12 12* 13*  --   ALKPHOS 383* 354* 337*  --   BILITOT 0.54 0.9 1.0  --      Pathology report Bone, biopsy, lytic lesion involving the left iliac crest 03/04/2015 - METASTATIC ADENOCARCINOMA WITH ABUNDANT MUCIN. Microscopic Comment The morphologic features are compatible with metastasis from the previous right upper lobe well-differentiated adenocarcinoma with abundant mucin from 12/12/2012 [phone]). ADDENDUM: At the request of Dr. Burr Medico, immunohistochemistry is performed and the tumor cells are  positive with cytokeratin 7, cytokeratin 20, and CDX2. Napsin A, thyroid transcription factor-1, estrogen receptor, progesterone receptor, and gross cystic disease fluid protein are negative. The immunophenotype is similar to the immunophenotype reported for the previous adenocarcinoma with abundant mucin from the lung.   RADIOGRAPHIC STUDIES: I have personally reviewed the radiological images as listed and agreed with the findings in the report. No new scans   ASSESSMENT & PLAN:  64 year old female,  with significant history of smoking and stage I right lung adenocarcinoma status post lobectomy in 2014, no presented with was any back pain and diffuse bone lesions on the lumbar spine MRI.  1. 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 -I reviewed her bone biopsy results, which showed metastatic adenocarcinoma, pulse morphology and immunostain are consistent with her prior lung adenocarcinoma.  -We discussed that this is incurable disease, and overall prognosis is very poor. -Her lung primary was tested negative for EGFR gene mutation and ALK translocation. I have requested [facility] one test on her prior lung cancer tissue -she is undergoing palliative left pelvic radiation for pain relief -She overall has improved, but we discussed the systemic therapy option with chemotherapy. Given her adenocarcinoma histology, I recommend carboplatin, pemetrexed and Avastin.  -Chemotherapy consent: Side effects including but does not not limited to, fatigue, nausea, vomiting, diarrhea, hair loss, neuropathy, fluid retention, renal and kidney dysfunction, neutropenic fever, needed for blood transfusion, bleeding, were discussed with patient in great detail. She agrees to proceed. -I tentatively [name] schedule her to start chemotherapy 1 week after her radiation, which is May 16. -She is being getting B12 injections at her primary care physician's office, she is going to have it again this Friday  -she is taking folic acid -she agrees to start chemotherapy next Wednesday 04/21/2015  2.  Left low back and pelvic pain secondary to prior lumbar surgery and bone metastasis -She has diffuse bone metastasis in spine, with the worst at T12 vertebral body,  No cold compression on scan. However she is not symptomatically from T12 lesion -She is on palliative radiation to the left pelvis for metastatic bone related pain -She did not tolerate  morphine, with skin itchiness. -Increase OxyContin to 30 mg every 12 hours, I given her a new prescription today  -Continue oxycodone as needed -I'll consider Xgeva when she started systemic treatment. She agrees to see her dentist and have her teeth cleaned and checked out.  3.  Untreated hepatitis C,  Hypertension, diabetes, bipolar - She'll continue follow-up with her primary care physician  4. Feet edema -I schedule her for low extremity Doppler this afternoon at 4:00 at [facility], to ruled out DVT.  -She knows to elevate her legs when she sits.  5. Intermittent nausea and vomiting, unknown etiology -She was admitted to hospital twice for nausea and vomiting, workup was negative. -She has no nausea vomiting now.  6. Frequent hospital admission and ED visit  -I encouraged her to contact my office for any acute new symptoms, we'll be happy to see her in our symptom management clinic.    Plan -I gave her a prescription of extensor release oxycodone 30 mg every 12 hours today -continue oxycodone 10-20 mg every 4 hours as needed -Starting chemotherapy with carboplatin, pemetrexed and Avastin on 6/1, I'll see her back before chemotherapy. -chemo class this week  -Low extremity Doppler today  All questions were answered. The patient knows  to call the clinic with any problems, questions or concerns. I spent 30 minutes counseling the patient face to face. The total time spent in the appointment was 40 minutes and more than 50% was on counseling.    Truitt Merle, MD 04/14/2015 1:40 PM

## 2015-04-14 NOTE — Telephone Encounter (Signed)
cld & spoke to pt-adv did not stop by sch today and needed to go back to lab-pt stated will come tomorrow, had to catch the bus-adv pt of sch for chemo edc class on 5/27-adv pt to come to sch to get sch tomorrow-pt understood-pt stated has not made up mind yet whether or not she will proceed with trmt

## 2015-04-15 ENCOUNTER — Ambulatory Visit: Payer: Self-pay

## 2015-04-15 ENCOUNTER — Other Ambulatory Visit: Payer: Self-pay

## 2015-04-15 ENCOUNTER — Telehealth: Payer: Self-pay | Admitting: *Deleted

## 2015-04-15 ENCOUNTER — Other Ambulatory Visit: Payer: Self-pay | Admitting: *Deleted

## 2015-04-15 ENCOUNTER — Encounter (HOSPITAL_COMMUNITY): Payer: Self-pay | Admitting: Emergency Medicine

## 2015-04-15 ENCOUNTER — Emergency Department (HOSPITAL_COMMUNITY)
Admission: EM | Admit: 2015-04-15 | Discharge: 2015-04-15 | Disposition: A | Discharge status: Home / Self Care | Payer: Medicare HMO | Attending: Emergency Medicine | Admitting: Emergency Medicine

## 2015-04-15 ENCOUNTER — Ambulatory Visit (HOSPITAL_COMMUNITY): Payer: Medicare HMO | Attending: Hematology

## 2015-04-15 ENCOUNTER — Emergency Department (HOSPITAL_BASED_OUTPATIENT_CLINIC_OR_DEPARTMENT_OTHER): Admit: 2015-04-15 | Discharge: 2015-04-15 | Disposition: A | Discharge status: Home / Self Care | Payer: Medicare HMO

## 2015-04-15 DIAGNOSIS — F319 Bipolar disorder, unspecified: Secondary | ICD-10-CM | POA: Diagnosis not present

## 2015-04-15 DIAGNOSIS — D696 Thrombocytopenia, unspecified: Secondary | ICD-10-CM | POA: Insufficient documentation

## 2015-04-15 DIAGNOSIS — M7989 Other specified soft tissue disorders: Secondary | ICD-10-CM

## 2015-04-15 DIAGNOSIS — F419 Anxiety disorder, unspecified: Secondary | ICD-10-CM | POA: Diagnosis not present

## 2015-04-15 DIAGNOSIS — K219 Gastro-esophageal reflux disease without esophagitis: Secondary | ICD-10-CM | POA: Diagnosis not present

## 2015-04-15 DIAGNOSIS — Z87891 Personal history of nicotine dependence: Secondary | ICD-10-CM | POA: Diagnosis not present

## 2015-04-15 DIAGNOSIS — Z7952 Long term (current) use of systemic steroids: Secondary | ICD-10-CM | POA: Insufficient documentation

## 2015-04-15 DIAGNOSIS — C3490 Malignant neoplasm of unspecified part of unspecified bronchus or lung: Secondary | ICD-10-CM

## 2015-04-15 DIAGNOSIS — Z880 Allergy status to penicillin: Secondary | ICD-10-CM | POA: Diagnosis not present

## 2015-04-15 DIAGNOSIS — Z79899 Other long term (current) drug therapy: Secondary | ICD-10-CM | POA: Insufficient documentation

## 2015-04-15 DIAGNOSIS — I10 Essential (primary) hypertension: Secondary | ICD-10-CM | POA: Insufficient documentation

## 2015-04-15 DIAGNOSIS — J449 Chronic obstructive pulmonary disease, unspecified: Secondary | ICD-10-CM | POA: Diagnosis not present

## 2015-04-15 DIAGNOSIS — G40909 Epilepsy, unspecified, not intractable, without status epilepticus: Secondary | ICD-10-CM | POA: Insufficient documentation

## 2015-04-15 DIAGNOSIS — R58 Hemorrhage, not elsewhere classified: Secondary | ICD-10-CM

## 2015-04-15 DIAGNOSIS — R011 Cardiac murmur, unspecified: Secondary | ICD-10-CM | POA: Insufficient documentation

## 2015-04-15 DIAGNOSIS — M7981 Nontraumatic hematoma of soft tissue: Secondary | ICD-10-CM | POA: Diagnosis not present

## 2015-04-15 DIAGNOSIS — M79609 Pain in unspecified limb: Secondary | ICD-10-CM

## 2015-04-15 DIAGNOSIS — Z8619 Personal history of other infectious and parasitic diseases: Secondary | ICD-10-CM | POA: Diagnosis not present

## 2015-04-15 DIAGNOSIS — G8929 Other chronic pain: Secondary | ICD-10-CM | POA: Diagnosis not present

## 2015-04-15 DIAGNOSIS — M791 Myalgia: Secondary | ICD-10-CM | POA: Diagnosis not present

## 2015-04-15 DIAGNOSIS — Z85118 Personal history of other malignant neoplasm of bronchus and lung: Secondary | ICD-10-CM | POA: Insufficient documentation

## 2015-04-15 DIAGNOSIS — E119 Type 2 diabetes mellitus without complications: Secondary | ICD-10-CM | POA: Insufficient documentation

## 2015-04-15 DIAGNOSIS — M79606 Pain in leg, unspecified: Secondary | ICD-10-CM

## 2015-04-15 DIAGNOSIS — C3411 Malignant neoplasm of upper lobe, right bronchus or lung: Secondary | ICD-10-CM

## 2015-04-15 LAB — CBC WITH DIFFERENTIAL/PLATELET
Basophils Absolute: 0 10*3/uL (ref 0.0–0.1)
Basophils Relative: 0 % (ref 0–1)
EOS ABS: 0 10*3/uL (ref 0.0–0.7)
Eosinophils Relative: 0 % (ref 0–5)
HCT: 30.8 % — ABNORMAL LOW (ref 36.0–46.0)
Hemoglobin: 9.8 g/dL — ABNORMAL LOW (ref 12.0–15.0)
LYMPHS ABS: 0.5 10*3/uL — AB (ref 0.7–4.0)
Lymphocytes Relative: 5 % — ABNORMAL LOW (ref 12–46)
MCH: 28.4 pg (ref 26.0–34.0)
MCHC: 31.8 g/dL (ref 30.0–36.0)
MCV: 89.3 fL (ref 78.0–100.0)
MONOS PCT: 5 % (ref 3–12)
Monocytes Absolute: 0.5 10*3/uL (ref 0.1–1.0)
NEUTROS ABS: 8.8 10*3/uL — AB (ref 1.7–7.7)
NEUTROS PCT: 90 % — AB (ref 43–77)
PLATELETS: 75 10*3/uL — AB (ref 150–400)
RBC: 3.45 MIL/uL — AB (ref 3.87–5.11)
RDW: 15 % (ref 11.5–15.5)
WBC: 9.8 10*3/uL (ref 4.0–10.5)

## 2015-04-15 LAB — PROTIME-INR
INR: 1.47 (ref 0.00–1.49)
PROTHROMBIN TIME: 17.9 s — AB (ref 11.6–15.2)

## 2015-04-15 LAB — URINALYSIS, ROUTINE W REFLEX MICROSCOPIC
Bilirubin Urine: NEGATIVE
GLUCOSE, UA: NEGATIVE mg/dL
HGB URINE DIPSTICK: NEGATIVE
KETONES UR: NEGATIVE mg/dL
LEUKOCYTES UA: NEGATIVE
Nitrite: NEGATIVE
PH: 7 (ref 5.0–8.0)
Protein, ur: NEGATIVE mg/dL
Specific Gravity, Urine: 1.007 (ref 1.005–1.030)
Urobilinogen, UA: 1 mg/dL (ref 0.0–1.0)

## 2015-04-15 LAB — BASIC METABOLIC PANEL
Anion gap: 12 (ref 5–15)
BUN: 13 mg/dL (ref 6–20)
CHLORIDE: 101 mmol/L (ref 101–111)
CO2: 24 mmol/L (ref 22–32)
Calcium: 8.4 mg/dL — ABNORMAL LOW (ref 8.9–10.3)
Creatinine, Ser: 0.62 mg/dL (ref 0.44–1.00)
GFR calc Af Amer: >60 mL/min (ref >60)
GFR calc non Af Amer: >60 mL/min (ref >60)
Glucose, Bld: 144 mg/dL — ABNORMAL HIGH (ref 65–99)
POTASSIUM: 4 mmol/L (ref 3.5–5.1)
SODIUM: 137 mmol/L (ref 135–145)

## 2015-04-15 LAB — APTT: aPTT: 32 seconds (ref 24–37)

## 2015-04-15 MED ORDER — LORAZEPAM 1 MG PO TABS
1.0000 mg | ORAL_TABLET | Freq: Once | ORAL | Status: AC
  Administered 2015-04-15: 1 mg via ORAL
  Filled 2015-04-15: qty 1

## 2015-04-15 MED ORDER — HEPARIN SOD (PORK) LOCK FLUSH 100 UNIT/ML IV SOLN
500.0000 [IU] | Freq: Once | INTRAVENOUS | Status: AC
  Administered 2015-04-15: 500 [IU]
  Filled 2015-04-15: qty 5

## 2015-04-15 MED ORDER — HYDROMORPHONE HCL 1 MG/ML IJ SOLN
1.0000 mg | Freq: Once | INTRAMUSCULAR | Status: AC
  Administered 2015-04-15: 1 mg via INTRAVENOUS
  Filled 2015-04-15: qty 1

## 2015-04-15 NOTE — ED Provider Notes (Signed)
CSN: [id number]     Arrival date & time 04/15/15  1621 History   First MD Initiated Contact with Patient 04/15/15 1702     Chief Complaint  Patient presents with  . 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 Patient was hospitalized 2 weeks ago with abdominal pain, nausea and vomiting. Patient states that her symptoms got worse. Her oncologist ordered a Doppler ultrasound which she was not able to get to do because of transportation. Patient has been taking home oxycodone without relief. She denies bleeding of her gums, hematuria, hematochezia, hemoptysis. No blood thinners or aspirin. Patient denies injuries to these areas. Patient has recently finished radiation and has not had any recent chemotherapy. The onset of this condition was acute. The course is worsening. Aggravating factors: none. Alleviating factors: none.    The history is provided by the patient and medical records.    Past Medical History  Diagnosis Date  . Hypertension   . Bipolar 1 disorder   . Heart murmur   . Sciatica   . Arthritis   . Anxiety   . Diverticulosis   . Chronic low back pain   . DDD (degenerative disc disease), lumbar   . Chronic shoulder pain   . Chronic neck pain   . Hepatitis B     Unclear when initially diagnosed, labs in Epic from 03/09/13  . Hepatitis C     Unclear when initially diagnosed, labs in Epic from 03/09/13  . C. difficile diarrhea     April and February 2014  . COPD (chronic obstructive pulmonary disease)   . GERD (gastroesophageal reflux disease)   . H/O hiatal hernia   . Headache(784.0)     hx  . PONV (postoperative nausea and vomiting)   . Seizures     on meds  . Diabetes mellitus   . Adenocarcinoma of  lung, stage 1    Past Surgical History  Procedure Laterality Date  . Facial tumor removal Right 2000  . Brain tumor removal  09  . Brain surgery      in [place]  . Video bronchoscopy  12/04/2012    Procedure: VIDEO BRONCHOSCOPY;  Surgeon: Grace Isaac, MD;  Location: [facility] OR;  Service: Thoracic;  Laterality: N/A;  . Video assisted thoracoscopy (vats)/wedge resection  12/04/2012    Procedure: VIDEO ASSISTED THORACOSCOPY (VATS)/WEDGE RESECTION;  Surgeon: Grace Isaac, MD;  Location: [place];  Service: Thoracic;  Laterality: Right;  . Lobectomy  12/04/2012    Procedure: LOBECTOMY;  Surgeon: Grace Isaac, MD;  Location: [place];  Service: Thoracic;  Laterality: Right;  completion of right upper lobectomy and lymph node disection, placement of on q pump  . Tubal ligation    . Tonsillectomy    . Anterior cervical decomp/discectomy fusion N/A 03/05/2014    Procedure: ANTERIOR CERVICAL DECOMPRESSION/DISCECTOMY FUSION 2 LEVELS;  Surgeon: Sinclair Ship, MD;  Location: [place];  Service: Orthopedics;  Laterality: N/A;  Anterior cervical decompression fusion, cervical 4-5, cervical 5-6 with instrumentation, allograft.   Family History  Problem Relation Age of Onset  . Hypertension Father     deceased  . Diabetes Father   . Heart disease Father   . Hyperlipidemia Father   . Hypertension Mother   . Hyperlipidemia Mother   .  Diabetes Mother   . Hypertension Sister   . Hyperlipidemia Sister   . Hypertension Brother   . Hyperlipidemia Brother    History  Substance Use Topics  . Smoking status: Former Smoker -- 0.25 packs/day for 10 years    Types: Cigarettes    Quit date: 07/07/2013  . Smokeless tobacco: Never Used     Comment: Smokes pk q 3 days   marajuna aug  . Alcohol Use: No     Comment: quit 6yr ago   OB History    No data available     Review of Systems  Constitutional: Negative for fever.  HENT: Negative for rhinorrhea and sore throat.   Eyes: Negative for  redness.  Respiratory: Negative for cough.   Cardiovascular: Positive for leg swelling. Negative for chest pain.  Gastrointestinal: Negative for nausea, vomiting, abdominal pain, diarrhea and blood in stool.  Genitourinary: Negative for dysuria and hematuria.  Musculoskeletal: Positive for myalgias.  Skin: Positive for color change. Negative for rash and wound.  Neurological: Negative for headaches.      Allergies  Haldol; Acetaminophen; Ibuprofen; Metronidazole; Morphine and related; and Penicillins  Home Medications   Prior to Admission medications   Medication Sig Start Date End Date Taking? Authorizing Provider  ALPRAZolam (Duanne Moron 1 MG tablet Take 0.5 mg by mouth 2 (two) times daily.     Historical Provider, MD  Cyanocobalamin (VITAMIN B-12 IJ) Inject as directed every 30 (thirty) days. Administered by Dr. DYork Ramaround the 1st of each month    Historical Provider, MD  dexamethasone (DECADRON) 4 MG tablet Take 1 tablet (4 mg total) by mouth daily. 03/12/15   GOswald Hillock MD  dicyclomine (BENTYL) 20 MG tablet Take 20 mg by mouth 3 (three) times daily with meals.    Historical Provider, MD  fluconazole (DIFLUCAN) 100 MG tablet Take 1 tablet (100 mg total) by mouth daily. 03/26/15   MTyler Pita MD  folic acid (FOLVITE) 1 MG tablet Take 1 tablet (1 mg total) by mouth daily. 03/23/15   YTruitt Merle MD  gabapentin (NEURONTIN) 400 MG capsule Take 400 mg by mouth 3 (three) times daily. Nerve pain    Historical Provider, MD  Ipratropium-Albuterol (COMBIVENT RESPIMAT) 20-100 MCG/ACT AERS respimat Inhale 2 puffs into the lungs daily as needed for wheezing or shortness of breath.     Historical Provider, MD  KLOR-CON M20 20 MEQ tablet Take 20 mEq by mouth every morning. with food 01/04/15   Historical Provider, MD  loratadine (CLARITIN) 10 MG tablet Take 10 mg by mouth every morning.     Historical Provider, MD  magnesium hydroxide (MILK OF MAGNESIA) 400 MG/5ML suspension Take 5 mLs by  mouth daily as needed for mild constipation. 03/24/15   Costin MKarlyne Greenspan MD  megestrol (MEGACE) 40 MG/ML suspension Take 10 mLs (400 mg total) by mouth daily. 04/06/15   VHosie Poisson MD  Menthol-Methyl Salicylate (MUSCLE RUB) 10-15 % CREA Apply 1 application topically daily as needed for muscle pain. Left shoulder pain    Historical Provider, MD  metoCLOPramide (REGLAN) 5 MG tablet Take 1 tablet (5 mg total) by mouth 3 (three) times daily before meals. 03/12/15   GOswald Hillock MD  metoprolol tartrate (LOPRESSOR) 25 MG tablet Take 25 mg by mouth daily.    Historical Provider, MD  olmesartan (BENICAR) 40 MG tablet Take 1 tablet (40 mg total) by mouth daily. 03/12/15   GOswald Hillock MD  omeprazole (PRILOSEC) 20 MG capsule Take  1 capsule (20 mg total) by mouth 2 (two) times daily before a meal. 03/26/15   Tyler Pita, MD  ondansetron (ZOFRAN-ODT) 8 MG disintegrating tablet Take 1 tablet by mouth every 6 (six) hours as needed for nausea or vomiting.  03/02/15   Historical Provider, MD  OXcarbazepine (TRILEPTAL) 150 MG tablet Take 150 mg by mouth 3 (three) times daily.  11/24/13   Historical Provider, MD  OxyCODONE (OXYCONTIN) 20 mg T12A 12 hr tablet Take 20 mg by mouth every 12 (twelve) hours.    Historical Provider, MD  OxyCODONE 30 MG T12A Take 30 mg by mouth every 12 (twelve) hours. Patient not taking: Reported on 04/14/2015 04/06/15   Hosie Poisson, MD  Oxycodone HCl 10 MG TABS Take 1-2 tablet (10 mg total) by mouth every 4 (four) hours as needed for moderate pain. 04/14/15   Truitt Merle, MD  OxyCODONE HCl ER 30 MG T12A 1 tab every 12 hours 04/14/15   Truitt Merle, MD  polyethylene glycol [facility] / Floria Raveling) packet Take 17 g by mouth 2 (two) times daily as needed for mild constipation or moderate constipation.    Historical Provider, MD  senna-docusate (SENOKOT-S) 8.6-50 MG per tablet Take 2 tablets by mouth 2 (two) times daily. 03/12/15   Oswald Hillock, MD  simvastatin (ZOCOR) 20 MG tablet Take 20 mg by mouth at  bedtime.     Historical Provider, MD  tiZANidine (ZANAFLEX) 4 MG tablet Take 4 mg by mouth every 8 (eight) hours as needed for muscle spasms.    Historical Provider, MD  triamcinolone cream (KENALOG) 0.1 % Apply 1 application topically 2 (two) times daily as needed (for rash).  11/30/14   Historical Provider, MD  Vitamin D, Ergocalciferol, (DRISDOL) 50000 UNITS CAPS capsule Take 50,000 Units by mouth every Monday.     Historical Provider, MD   BP 143/70 mmHg  Pulse 97  Temp(Src) 98.3 F (36.8 C) (Oral)  Resp 18  SpO2 97%   Physical Exam  Constitutional: She appears well-developed and well-nourished.  HENT:  Head: Normocephalic and atraumatic.  Mouth/Throat: Oropharynx is clear and moist.  No intraoral hematoma or bleeding.  Eyes: Conjunctivae are normal. Pupils are equal, round, and reactive to light. Right eye exhibits no discharge. Left eye exhibits no discharge.  Neck: Normal range of motion. Neck supple.  Cardiovascular: Normal rate, regular rhythm and normal heart sounds.   No murmur heard. Pulmonary/Chest: Effort normal and breath sounds normal. No respiratory distress. She has no wheezes. She has no rales.  Abdominal: Soft. There is no tenderness.  Musculoskeletal: She exhibits no edema or tenderness.  Neurological: She is alert.  Skin: Skin is warm and dry.  Scattered bruising on lower extremities and upper extremities.  Psychiatric: She has a normal mood and affect.  Nursing note and vitals reviewed.   ED Course  Procedures (including critical care time) Labs Review Labs Reviewed  CBC WITH DIFFERENTIAL/PLATELET - Abnormal; Notable for the following:    RBC 3.45 (*)    Hemoglobin 9.8 (*)    HCT 30.8 (*)    Platelets 75 (*)    Neutrophils Relative % 90 (*)    Lymphocytes Relative 5 (*)    Neutro Abs 8.8 (*)    Lymphs Abs 0.5 (*)    All other components within normal limits  BASIC METABOLIC PANEL - Abnormal; Notable for the following:    Glucose, Bld 144 (*)     Calcium 8.4 (*)    All other components within  normal limits  PROTIME-INR - Abnormal; Notable for the following:    Prothrombin Time 17.9 (*)    All other components within normal limits  URINALYSIS, ROUTINE W REFLEX MICROSCOPIC (NOT AT [facility]) - Abnormal; Notable for the following:    APPearance CLOUDY (*)    All other components within normal limits  APTT    Imaging Review No results found.   EKG Interpretation None      5:26 PM Patient seen and examined. Work-up initiated. Medications ordered.   Vital signs reviewed and are as follows: BP 143/70 mmHg  Pulse 97  Temp(Src) 98.3 F (36.8 C) (Oral)  Resp 18  SpO2 97%   8:04 PM Lab workup shows platelet count of 75. Workup is otherwise unrevealing. Lower extremity venous Dopplers negative for DVT. Patient informed of results. Will give additional dose of pain medication prior to discharge per patient request. Patient to be discharged to home. I sent a note to the patient's oncologist to alert them as to worsening thrombocytopenia over the past several weeks. Patient counseled on signs and symptoms to return at bedside including new or worsening bleeding, uncontrolled pain, lightheadedness or syncope, other concerns.   Pt discussed with and seen by Dr. Tomi Bamberger prior to discharge.    MDM   Final diagnoses:  Thrombocytopenia  Pain of lower extremity, unspecified laterality  Ecchymosis   Patient with c/o leg swelling -- although on exam there's not much swelling. Patient does have new ecchymosis. Noted to have a worsening thrombocytopenia over the past several weeks. Levels not to the point where life-threatening bleeding would be expected and at this point she can be discharged home with close oncology follow-up. Ultrasound of lower extremities performed to rule out DVT and none found.  No dangerous or life-threatening conditions suspected or identified by history, physical exam, and by work-up. No indications for hospitalization  identified.     Carlisle Cater, PA-C 04/15/15 2012  Dorie Rank, MD 04/15/15 2028

## 2015-04-15 NOTE — ED Notes (Signed)
Bed: WE31 Expected date:  Expected time:  Means of arrival:  Comments: hold

## 2015-04-15 NOTE — ED Notes (Signed)
Bed: WHALD Expected date:  Expected time:  Means of arrival:  Comments: 

## 2015-04-15 NOTE — Discharge Instructions (Signed)
Please read and follow all provided instructions.  Your diagnoses today include:  1. Thrombocytopenia   2. Pain of lower extremity, unspecified laterality   3. Ecchymosis     Tests performed today include:  Blood counts and electrolytes - shows that your platelet level is low and this is likely why you are bruising easily.  Ultrasound of your legs - no sign of blood clots  Vital signs. See below for your results today.   Medications prescribed:   None  Take any prescribed medications only as directed.  Home care instructions:  Follow any educational materials contained in this packet.  BE VERY CAREFUL not to take multiple medicines containing Tylenol (also called acetaminophen). Doing so can lead to an overdose which can damage your liver and cause liver failure and possibly death.   Follow-up instructions: Please follow-up with your oncologist in the next 3 days for further evaluation of your symptoms.   Return instructions:   Please return to the Emergency Department if you experience worsening symptoms.   Return with new bleeding of her gums, blood in your urine, blood in your stool, lightheadedness or if you pass out.  Please return if you have any other emergent concerns.  Additional Information:  Your vital signs today were: BP 164/76 mmHg   Pulse 83   Temp(Src) 98.4 F (36.9 C) (Oral)   Resp 16   SpO2 96% If your blood pressure (BP) was elevated above 135/85 this visit, please have this repeated by your doctor within one month. --------------

## 2015-04-15 NOTE — Telephone Encounter (Signed)
Per staff message and POF I have scheduled appts. Advised scheduler of appts. JMW  

## 2015-04-15 NOTE — Progress Notes (Signed)
Bilateral lower extremity venous duplex completed:  No obvious evidence of DVT, superficial thrombosis, or Baker's cyst.   Technically difficult study due to the patient's movements secondary to the pain.

## 2015-04-15 NOTE — ED Notes (Signed)
Pt c/o left leg and foot pain and swelling, along with ecchymosis and leg cramps. Pt states that her cancer MD is concerned for possible thrombus, scheduled pt to get blood drawn today but patient missed appointment due to not having a ride. Last dose of radiation was 04/06/15.

## 2015-04-16 ENCOUNTER — Telehealth: Payer: Self-pay | Admitting: *Deleted

## 2015-04-16 ENCOUNTER — Encounter: Payer: Self-pay | Admitting: Hematology

## 2015-04-16 ENCOUNTER — Other Ambulatory Visit: Payer: Self-pay

## 2015-04-16 ENCOUNTER — Other Ambulatory Visit: Payer: Self-pay | Admitting: *Deleted

## 2015-04-16 NOTE — Progress Notes (Signed)
Available without authorization. Per [employer] via covermymeds for oxycodone 10 mg tablets

## 2015-04-16 NOTE — Progress Notes (Signed)
Per [employer]-covermymeds oxycodone [facility] '30mg'$ . Available without authorization

## 2015-04-16 NOTE — Telephone Encounter (Signed)
TC from pt's daughter needing prior auth for oxycontin 30 mg.

## 2015-04-16 NOTE — Progress Notes (Signed)
I called to let the patient know no needed for her meds. See prev notes.

## 2015-04-16 NOTE — Telephone Encounter (Signed)
TC from daughter, [name]. She has several concerns:  1. oxycontin 30 mg  needs prior authorization in order to get filled  2. Oxycodone script was not signed  3. Pt. Doesn't think she wants chemo-very scared. Will not be coming to chemo class today  4. Went to ED last night with multiple areas of bruising. Labs revealed platelets @ 75k, HGB 9.8 and PT/INR slightly elevated

## 2015-04-20 ENCOUNTER — Telehealth: Payer: Self-pay | Admitting: *Deleted

## 2015-04-20 ENCOUNTER — Telehealth: Payer: Self-pay | Admitting: Hematology

## 2015-04-20 NOTE — Telephone Encounter (Signed)
Returned call to [name] [name] at [facility] re patient being discharged and needing a follow up appointment. Lovena Le not available. Spoke with another nurse who took info for 6/1 appointment and per nurse she will call patient to confirm her 6/1 appointment in our office.      See also 5/25 documentation. Patient aware of appointments in office.

## 2015-04-20 NOTE — Telephone Encounter (Signed)
TC from [place], a case manager @ [facility]. Pt had been admitted there over the weekend and is being discharged this morning.  Heather requesting list of pain meds pt is on. Provided this to her as well as when they were refilled.

## 2015-04-20 NOTE — Telephone Encounter (Deleted)
Returned call to [place] at [facility] re patient being discharged and needing a follow up appointment. Lovena Le not available. Spoke with another nurse who took info for 6/1 appointment and per nurse she will call patient to confirm her 6/1 appointment in our office

## 2015-04-20 NOTE — Telephone Encounter (Signed)
Script was taken to managed care & was told that it didn't need auth.  Script given back to pt by Raquel.

## 2015-04-21 ENCOUNTER — Ambulatory Visit: Payer: Self-pay | Admitting: Hematology

## 2015-04-21 ENCOUNTER — Other Ambulatory Visit: Payer: Self-pay

## 2015-04-22 ENCOUNTER — Other Ambulatory Visit: Payer: Self-pay | Admitting: *Deleted

## 2015-04-22 ENCOUNTER — Telehealth: Payer: Self-pay | Admitting: *Deleted

## 2015-04-22 ENCOUNTER — Ambulatory Visit: Payer: Self-pay

## 2015-04-22 NOTE — Telephone Encounter (Signed)
Per staff message and POF I have scheduled appts. Advised scheduler of appts. JMW  

## 2015-04-23 ENCOUNTER — Telehealth: Payer: Self-pay | Admitting: Hematology

## 2015-04-23 NOTE — Telephone Encounter (Signed)
Per 06/02 POF pt r/s from 06/06 to 06/08 chemo after MD visit, there is no MD visit on the 8th, sent msg to MD to confirm time and to add labs to apt.... KJ

## 2015-04-26 ENCOUNTER — Ambulatory Visit: Payer: Self-pay

## 2015-04-26 ENCOUNTER — Other Ambulatory Visit: Payer: Self-pay

## 2015-04-26 ENCOUNTER — Telehealth: Payer: Self-pay | Admitting: Hematology

## 2015-04-26 NOTE — Telephone Encounter (Signed)
S/w pt confirming MD visit with chemo same day 06/08 per 06/03 POF, MD confirmed D/T for office visit through staff msg.... KJ

## 2015-04-28 ENCOUNTER — Encounter: Payer: Self-pay | Admitting: Hematology

## 2015-04-28 ENCOUNTER — Telehealth: Payer: Self-pay | Admitting: Hematology

## 2015-04-28 ENCOUNTER — Emergency Department (HOSPITAL_COMMUNITY)
Admission: EM | Admit: 2015-04-28 | Discharge: 2015-04-29 | Disposition: A | Discharge status: Home / Self Care | Payer: Medicare HMO | Source: Home / Self Care | Attending: Emergency Medicine | Admitting: Emergency Medicine

## 2015-04-28 ENCOUNTER — Ambulatory Visit (HOSPITAL_BASED_OUTPATIENT_CLINIC_OR_DEPARTMENT_OTHER): Payer: Medicare HMO | Admitting: Hematology

## 2015-04-28 ENCOUNTER — Ambulatory Visit: Payer: Medicare HMO

## 2015-04-28 ENCOUNTER — Ambulatory Visit (HOSPITAL_BASED_OUTPATIENT_CLINIC_OR_DEPARTMENT_OTHER): Payer: Medicare HMO

## 2015-04-28 ENCOUNTER — Encounter (HOSPITAL_COMMUNITY): Payer: Self-pay | Admitting: Nurse Practitioner

## 2015-04-28 VITALS — BP 116/58 | HR 102 | Temp 98.9°F | Resp 20 | Ht 64.0 in | Wt 123.3 lb

## 2015-04-28 DIAGNOSIS — C3490 Malignant neoplasm of unspecified part of unspecified bronchus or lung: Secondary | ICD-10-CM

## 2015-04-28 DIAGNOSIS — T402X5A Adverse effect of other opioids, initial encounter: Secondary | ICD-10-CM | POA: Diagnosis present

## 2015-04-28 DIAGNOSIS — G8929 Other chronic pain: Secondary | ICD-10-CM | POA: Insufficient documentation

## 2015-04-28 DIAGNOSIS — C3411 Malignant neoplasm of upper lobe, right bronchus or lung: Secondary | ICD-10-CM | POA: Diagnosis not present

## 2015-04-28 DIAGNOSIS — Z923 Personal history of irradiation: Secondary | ICD-10-CM

## 2015-04-28 DIAGNOSIS — D630 Anemia in neoplastic disease: Secondary | ICD-10-CM | POA: Diagnosis present

## 2015-04-28 DIAGNOSIS — C7951 Secondary malignant neoplasm of bone: Secondary | ICD-10-CM

## 2015-04-28 DIAGNOSIS — M5430 Sciatica, unspecified side: Secondary | ICD-10-CM | POA: Diagnosis present

## 2015-04-28 DIAGNOSIS — G893 Neoplasm related pain (acute) (chronic): Principal | ICD-10-CM | POA: Diagnosis present

## 2015-04-28 DIAGNOSIS — M791 Myalgia, unspecified site: Secondary | ICD-10-CM

## 2015-04-28 DIAGNOSIS — M545 Low back pain: Secondary | ICD-10-CM | POA: Diagnosis present

## 2015-04-28 DIAGNOSIS — Z515 Encounter for palliative care: Secondary | ICD-10-CM

## 2015-04-28 DIAGNOSIS — R011 Cardiac murmur, unspecified: Secondary | ICD-10-CM | POA: Insufficient documentation

## 2015-04-28 DIAGNOSIS — R110 Nausea: Secondary | ICD-10-CM | POA: Insufficient documentation

## 2015-04-28 DIAGNOSIS — I10 Essential (primary) hypertension: Secondary | ICD-10-CM | POA: Diagnosis present

## 2015-04-28 DIAGNOSIS — Z833 Family history of diabetes mellitus: Secondary | ICD-10-CM

## 2015-04-28 DIAGNOSIS — Z888 Allergy status to other drugs, medicaments and biological substances status: Secondary | ICD-10-CM

## 2015-04-28 DIAGNOSIS — L299 Pruritus, unspecified: Secondary | ICD-10-CM | POA: Diagnosis not present

## 2015-04-28 DIAGNOSIS — E119 Type 2 diabetes mellitus without complications: Secondary | ICD-10-CM | POA: Insufficient documentation

## 2015-04-28 DIAGNOSIS — R112 Nausea with vomiting, unspecified: Secondary | ICD-10-CM | POA: Diagnosis present

## 2015-04-28 DIAGNOSIS — M5136 Other intervertebral disc degeneration, lumbar region: Secondary | ICD-10-CM | POA: Diagnosis present

## 2015-04-28 DIAGNOSIS — C7800 Secondary malignant neoplasm of unspecified lung: Secondary | ICD-10-CM | POA: Insufficient documentation

## 2015-04-28 DIAGNOSIS — F419 Anxiety disorder, unspecified: Secondary | ICD-10-CM | POA: Insufficient documentation

## 2015-04-28 DIAGNOSIS — R109 Unspecified abdominal pain: Secondary | ICD-10-CM | POA: Diagnosis not present

## 2015-04-28 DIAGNOSIS — Z885 Allergy status to narcotic agent status: Secondary | ICD-10-CM

## 2015-04-28 DIAGNOSIS — Z87891 Personal history of nicotine dependence: Secondary | ICD-10-CM | POA: Insufficient documentation

## 2015-04-28 DIAGNOSIS — M542 Cervicalgia: Secondary | ICD-10-CM | POA: Diagnosis present

## 2015-04-28 DIAGNOSIS — R34 Anuria and oliguria: Secondary | ICD-10-CM

## 2015-04-28 DIAGNOSIS — R05 Cough: Secondary | ICD-10-CM | POA: Diagnosis present

## 2015-04-28 DIAGNOSIS — E860 Dehydration: Secondary | ICD-10-CM | POA: Diagnosis present

## 2015-04-28 DIAGNOSIS — E876 Hypokalemia: Secondary | ICD-10-CM | POA: Diagnosis not present

## 2015-04-28 DIAGNOSIS — Z886 Allergy status to analgesic agent status: Secondary | ICD-10-CM

## 2015-04-28 DIAGNOSIS — Z8249 Family history of ischemic heart disease and other diseases of the circulatory system: Secondary | ICD-10-CM

## 2015-04-28 DIAGNOSIS — K219 Gastro-esophageal reflux disease without esophagitis: Secondary | ICD-10-CM

## 2015-04-28 DIAGNOSIS — Z79899 Other long term (current) drug therapy: Secondary | ICD-10-CM | POA: Insufficient documentation

## 2015-04-28 DIAGNOSIS — J449 Chronic obstructive pulmonary disease, unspecified: Secondary | ICD-10-CM | POA: Diagnosis present

## 2015-04-28 DIAGNOSIS — K5790 Diverticulosis of intestine, part unspecified, without perforation or abscess without bleeding: Secondary | ICD-10-CM | POA: Diagnosis present

## 2015-04-28 DIAGNOSIS — M4806 Spinal stenosis, lumbar region: Secondary | ICD-10-CM | POA: Diagnosis present

## 2015-04-28 DIAGNOSIS — R300 Dysuria: Secondary | ICD-10-CM | POA: Insufficient documentation

## 2015-04-28 DIAGNOSIS — C3410 Malignant neoplasm of upper lobe, unspecified bronchus or lung: Secondary | ICD-10-CM

## 2015-04-28 DIAGNOSIS — Z7952 Long term (current) use of systemic steroids: Secondary | ICD-10-CM | POA: Insufficient documentation

## 2015-04-28 DIAGNOSIS — Z8619 Personal history of other infectious and parasitic diseases: Secondary | ICD-10-CM | POA: Insufficient documentation

## 2015-04-28 DIAGNOSIS — B1920 Unspecified viral hepatitis C without hepatic coma: Secondary | ICD-10-CM | POA: Diagnosis present

## 2015-04-28 DIAGNOSIS — G40909 Epilepsy, unspecified, not intractable, without status epilepticus: Secondary | ICD-10-CM

## 2015-04-28 DIAGNOSIS — E43 Unspecified severe protein-calorie malnutrition: Secondary | ICD-10-CM | POA: Diagnosis present

## 2015-04-28 DIAGNOSIS — Z880 Allergy status to penicillin: Secondary | ICD-10-CM

## 2015-04-28 DIAGNOSIS — M1990 Unspecified osteoarthritis, unspecified site: Secondary | ICD-10-CM

## 2015-04-28 DIAGNOSIS — B1910 Unspecified viral hepatitis B without hepatic coma: Secondary | ICD-10-CM | POA: Diagnosis present

## 2015-04-28 DIAGNOSIS — Z79891 Long term (current) use of opiate analgesic: Secondary | ICD-10-CM

## 2015-04-28 DIAGNOSIS — R509 Fever, unspecified: Secondary | ICD-10-CM | POA: Insufficient documentation

## 2015-04-28 DIAGNOSIS — F319 Bipolar disorder, unspecified: Secondary | ICD-10-CM | POA: Insufficient documentation

## 2015-04-28 DIAGNOSIS — R000 Tachycardia, unspecified: Secondary | ICD-10-CM | POA: Diagnosis present

## 2015-04-28 DIAGNOSIS — R569 Unspecified convulsions: Secondary | ICD-10-CM | POA: Diagnosis present

## 2015-04-28 DIAGNOSIS — R600 Localized edema: Secondary | ICD-10-CM

## 2015-04-28 DIAGNOSIS — D6959 Other secondary thrombocytopenia: Secondary | ICD-10-CM | POA: Diagnosis present

## 2015-04-28 DIAGNOSIS — Z6821 Body mass index (BMI) 21.0-21.9, adult: Secondary | ICD-10-CM

## 2015-04-28 DIAGNOSIS — R6510 Systemic inflammatory response syndrome (SIRS) of non-infectious origin without acute organ dysfunction: Secondary | ICD-10-CM | POA: Diagnosis present

## 2015-04-28 DIAGNOSIS — E872 Acidosis: Secondary | ICD-10-CM | POA: Diagnosis present

## 2015-04-28 DIAGNOSIS — F411 Generalized anxiety disorder: Secondary | ICD-10-CM | POA: Diagnosis present

## 2015-04-28 DIAGNOSIS — Z981 Arthrodesis status: Secondary | ICD-10-CM

## 2015-04-28 DIAGNOSIS — E1140 Type 2 diabetes mellitus with diabetic neuropathy, unspecified: Secondary | ICD-10-CM | POA: Diagnosis present

## 2015-04-28 DIAGNOSIS — Z902 Acquired absence of lung [part of]: Secondary | ICD-10-CM | POA: Diagnosis present

## 2015-04-28 DIAGNOSIS — K5909 Other constipation: Secondary | ICD-10-CM | POA: Diagnosis present

## 2015-04-28 DIAGNOSIS — M25519 Pain in unspecified shoulder: Secondary | ICD-10-CM | POA: Diagnosis present

## 2015-04-28 LAB — CBC WITH DIFFERENTIAL/PLATELET
BASO%: 0.4 % (ref 0.0–2.0)
Basophils Absolute: 0 10*3/uL (ref 0.0–0.1)
EOS%: 0.9 % (ref 0.0–7.0)
Eosinophils Absolute: 0.1 10*3/uL (ref 0.0–0.5)
HCT: 27.7 % — ABNORMAL LOW (ref 34.8–46.6)
HGB: 8.8 g/dL — ABNORMAL LOW (ref 11.6–15.9)
LYMPH#: 1 10*3/uL (ref 0.9–3.3)
LYMPH%: 14.7 % (ref 14.0–49.7)
MCH: 28.7 pg (ref 25.1–34.0)
MCHC: 31.8 g/dL (ref 31.5–36.0)
MCV: 90.2 fL (ref 79.5–101.0)
MONO#: 0.4 10*3/uL (ref 0.1–0.9)
MONO%: 5.7 % (ref 0.0–14.0)
NEUT%: 78.3 % — AB (ref 38.4–76.8)
NEUTROS ABS: 5.2 10*3/uL (ref 1.5–6.5)
Platelets: 74 10*3/uL — ABNORMAL LOW (ref 145–400)
RBC: 3.07 10*6/uL — ABNORMAL LOW (ref 3.70–5.45)
RDW: 16.7 % — AB (ref 11.2–14.5)
WBC: 6.7 10*3/uL (ref 3.9–10.3)
nRBC: 5 % — ABNORMAL HIGH (ref 0–0)

## 2015-04-28 LAB — COMPREHENSIVE METABOLIC PANEL (CC13)
ALBUMIN: 3 g/dL — AB (ref 3.5–5.0)
ALT: <6 U/L (ref 0–55)
AST: 13 U/L (ref 5–34)
Alkaline Phosphatase: 457 U/L — ABNORMAL HIGH (ref 40–150)
Anion Gap: 9 mEq/L (ref 3–11)
BUN: 14.5 mg/dL (ref 7.0–26.0)
CO2: 25 mEq/L (ref 22–29)
Calcium: 8.6 mg/dL (ref 8.4–10.4)
Chloride: 106 mEq/L (ref 98–109)
Creatinine: 0.7 mg/dL (ref 0.6–1.1)
EGFR: >90 mL/min/{1.73_m2} (ref >90)
GLUCOSE: 116 mg/dL (ref 70–140)
POTASSIUM: 3.7 meq/L (ref 3.5–5.1)
Sodium: 141 mEq/L (ref 136–145)
Total Bilirubin: 0.46 mg/dL (ref 0.20–1.20)
Total Protein: 6 g/dL — ABNORMAL LOW (ref 6.4–8.3)

## 2015-04-28 LAB — COMPREHENSIVE METABOLIC PANEL
ALT: 10 U/L — ABNORMAL LOW (ref 14–54)
AST: 18 U/L (ref 15–41)
Albumin: 3.2 g/dL — ABNORMAL LOW (ref 3.5–5.0)
Alkaline Phosphatase: 464 U/L — ABNORMAL HIGH (ref 38–126)
Anion gap: 9 (ref 5–15)
BUN: 13 mg/dL (ref 6–20)
CO2: 24 mmol/L (ref 22–32)
Calcium: 8.7 mg/dL — ABNORMAL LOW (ref 8.9–10.3)
Chloride: 105 mmol/L (ref 101–111)
Creatinine, Ser: 0.54 mg/dL (ref 0.44–1.00)
GFR calc Af Amer: >60 mL/min (ref >60)
GFR calc non Af Amer: >60 mL/min (ref >60)
Glucose, Bld: 113 mg/dL — ABNORMAL HIGH (ref 65–99)
Potassium: 3.7 mmol/L (ref 3.5–5.1)
Sodium: 138 mmol/L (ref 135–145)
Total Bilirubin: 0.7 mg/dL (ref 0.3–1.2)
Total Protein: 6.4 g/dL — ABNORMAL LOW (ref 6.5–8.1)

## 2015-04-28 LAB — TECHNOLOGIST REVIEW

## 2015-04-28 MED ORDER — HYDROMORPHONE HCL 1 MG/ML IJ SOLN
1.0000 mg | Freq: Once | INTRAMUSCULAR | Status: AC
  Administered 2015-04-28: 1 mg via INTRAVENOUS
  Filled 2015-04-28: qty 1

## 2015-04-28 MED ORDER — ONDANSETRON 8 MG PO TBDP: 8.0000 mg | ORAL_TABLET | Freq: Four times a day (QID) | ORAL | Status: AC | PRN

## 2015-04-28 MED ORDER — OXYCODONE HCL ER 20 MG PO T12A: 20.0000 mg | EXTENDED_RELEASE_TABLET | Freq: Two times a day (BID) | ORAL | Status: DC

## 2015-04-28 MED ORDER — OXYCODONE HCL 10 MG PO TABS: ORAL_TABLET | ORAL | Status: DC

## 2015-04-28 MED ORDER — ONDANSETRON HCL 4 MG/2ML IJ SOLN
4.0000 mg | Freq: Once | INTRAMUSCULAR | Status: AC
  Administered 2015-04-28: 4 mg via INTRAVENOUS
  Filled 2015-04-28: qty 2

## 2015-04-28 MED ORDER — SODIUM CHLORIDE 0.9 % IV BOLUS (SEPSIS)
1000.0000 mL | Freq: Once | INTRAVENOUS | Status: AC
  Administered 2015-04-28: 1000 mL via INTRAVENOUS

## 2015-04-28 NOTE — ED Notes (Signed)
Pt presents with c/o generalized body aches, pt states she is an oncology pt receiving radiation for bone cancer. Rating pain 10/10, denies shortness of breath, chest pain reports nausea w/o vomiting.

## 2015-04-28 NOTE — Progress Notes (Signed)
Per Lucc[place] at [facility], she took prior [place] info over phone for oxycodone for patient. I let myrtle know via staff mess.

## 2015-04-28 NOTE — ED Provider Notes (Signed)
CSN: [id number]     Arrival date & time 04/28/15  2206 History   First MD Initiated Contact with Patient 04/28/15 2219     Chief Complaint  Patient presents with  . Generalized Body Aches  . Cancer Pt      (Consider location/radiation/quality/duration/timing/severity/associated sxs/prior Treatment) HPI  Pt is a 64yo female with hx of HTN, bipolar 1 disorder, anxiety, chronic pain, COPD, GERD, DM, and adenocarcinoma of lung, presenting to ED with c/o generalized body aches with associated nausea and chills.  Pt states she hurts all over, 10/10.  Pt states she does have dysuria with decreased urine output.  Pt is an oncology pt receiving radiation for bone cancer.  Pt states she was at cancer center earlier today, she did not receive radiation today. Pt states visit went well but body aches came on tonight. Pt states she normally takes oxycontin for pain but is out of her medication. She did not take it tonight. Denies chest pain, SOB, abdominal pain, vomiting or diarrhea.   Per medical record notes from oncology today, pt seemed more manic and argumentative with her daughter but otherwise no acute change in pt's medical status. Notes do mention pt was hospitalized a few weeks ago in [place] for pain and n/v control. Pt was there visiting family. Pt scheduled to start chemo 6/15.  Pt also prescribed more oxycodone.    Past Medical History  Diagnosis Date  . Hypertension   . Bipolar 1 disorder   . Heart murmur   . Sciatica   . Arthritis   . Anxiety   . Diverticulosis   . Chronic low back pain   . DDD (degenerative disc disease), lumbar   . Chronic shoulder pain   . Chronic neck pain   . Hepatitis B     Unclear when initially diagnosed, labs in Epic from 03/09/13  . Hepatitis C     Unclear when initially diagnosed, labs in Epic from 03/09/13  . C. difficile diarrhea     April and February 2014  . COPD (chronic obstructive pulmonary disease)   . GERD (gastroesophageal reflux disease)    . H/O hiatal hernia   . Headache(784.0)     hx  . PONV (postoperative nausea and vomiting)   . Seizures     on meds  . Diabetes mellitus   . Adenocarcinoma of lung, stage 1    Past Surgical History  Procedure Laterality Date  . Facial tumor removal Right 2000  . Brain tumor removal  09  . Brain surgery      in [place]  . Video bronchoscopy  12/04/2012    Procedure: VIDEO BRONCHOSCOPY;  Surgeon: Grace Isaac, MD;  Location[facility];  Service: Thoracic;  Laterality: N/A;  . Video assisted thoracoscopy (vats)/wedge resection  12/04/2012    Procedure: VIDEO ASSISTED THORACOSCOPY (VATS)/WEDGE RESECTION;  Surgeon: Grace Isaac, MD;  Location: [place];  Service: Thoracic;  Laterality: Right;  . Lobectomy  12/04/2012    Procedure: LOBECTOMY;  Surgeon: Grace Isaac, MD;  Location: [place];  Service: Thoracic;  Laterality: Right;  completion of right upper lobectomy and lymph node disection, placement of on q pump  . Tubal ligation    . Tonsillectomy    . Anterior cervical decomp/discectomy fusion N/A 03/05/2014    Procedure: ANTERIOR CERVICAL DECOMPRESSION/DISCECTOMY FUSION 2 LEVELS;  Surgeon: Sinclair Ship, MD;  Location: [place];  Service: Orthopedics;  Laterality: N/A;  Anterior cervical decompression fusion, cervical 4-5,  cervical 5-6 with instrumentation, allograft.   Family History  Problem Relation Age of Onset  . Hypertension Father     deceased  . Diabetes Father   . Heart disease Father   . Hyperlipidemia Father   . Hypertension Mother   . Hyperlipidemia Mother   . Diabetes Mother   . Hypertension Sister   . Hyperlipidemia Sister   . Hypertension Brother   . Hyperlipidemia Brother    History  Substance Use Topics  . Smoking status: Former Smoker -- 0.25 packs/day for 10 years    Types: Cigarettes    Quit date: 07/07/2013  . Smokeless tobacco: Never Used     Comment: Smokes pk q 3 days   marajuna aug  . Alcohol Use: No     Comment: quit 25yr ago    OB History    No data available     Review of Systems  Constitutional: Positive for fever ( subjective), chills, appetite change and fatigue. Negative for diaphoresis.  Respiratory: Negative for cough and shortness of breath.   Cardiovascular: Negative for chest pain, palpitations and leg swelling.  Gastrointestinal: Positive for nausea. Negative for vomiting and diarrhea.  Genitourinary: Positive for dysuria and decreased urine volume. Negative for urgency, frequency and hematuria.  All other systems reviewed and are negative.     Allergies  Haldol; Acetaminophen; Ibuprofen; Metronidazole; Morphine and related; and Penicillins  Home Medications   Prior to Admission medications   Medication Sig Start Date End Date Taking? Authorizing Provider  ALPRAZolam (Duanne Moron 1 MG tablet Take 0.5 mg by mouth 2 (two) times daily.    Yes Historical Provider, MD  cholecalciferol (VITAMIN D) 1000 UNITS tablet Take 1,000 Units by mouth daily.   Yes Historical Provider, MD  dexamethasone (DECADRON) 4 MG tablet Take 1 tablet (4 mg total) by mouth daily. 03/12/15  Yes GOswald Hillock MD  dicyclomine (BENTYL) 20 MG tablet Take 20 mg by mouth 3 (three) times daily with meals.   Yes Historical Provider, MD  fluconazole (DIFLUCAN) 100 MG tablet Take 1 tablet (100 mg total) by mouth daily. 03/26/15  Yes MTyler Pita MD  folic acid (FOLVITE) 1 MG tablet Take 1 tablet (1 mg total) by mouth daily. 03/23/15  Yes YTruitt Merle MD  gabapentin (NEURONTIN) 400 MG capsule Take 400 mg by mouth 3 (three) times daily. Nerve pain   Yes Historical Provider, MD  Ipratropium-Albuterol (COMBIVENT RESPIMAT) 20-100 MCG/ACT AERS respimat Inhale 2 puffs into the lungs daily as needed for wheezing or shortness of breath.    Yes Historical Provider, MD  KLOR-CON M20 20 MEQ tablet Take 20 mEq by mouth every morning. with food 01/04/15  Yes Historical Provider, MD  loratadine (CLARITIN) 10 MG tablet Take 10 mg by mouth every morning.    Yes  Historical Provider, MD  magnesium hydroxide (MILK OF MAGNESIA) 400 MG/5ML suspension Take 5 mLs by mouth daily as needed for mild constipation. 03/24/15  Yes Costin MKarlyne Greenspan MD  Menthol-Methyl Salicylate (MUSCLE RUB) 10-15 % CREA Apply 1 application topically daily as needed for muscle pain. Left shoulder pain   Yes Historical Provider, MD  metFORMIN (GLUCOPHAGE) 500 MG tablet Take 1 tablet by mouth 2 (two) times daily. 03/30/15  Yes Historical Provider, MD  metoCLOPramide (REGLAN) 5 MG tablet Take 1 tablet (5 mg total) by mouth 3 (three) times daily before meals. 03/12/15  Yes GOswald Hillock MD  OXcarbazepine (TRILEPTAL) 150 MG tablet Take 150 mg by mouth 3 (three) times daily.  11/24/13  Yes Historical Provider, MD  simvastatin (ZOCOR) 20 MG tablet Take 20 mg by mouth at bedtime.    Yes Historical Provider, MD  tiZANidine (ZANAFLEX) 4 MG tablet Take 4 mg by mouth every 8 (eight) hours as needed for muscle spasms.   Yes Historical Provider, MD  Vitamin D, Ergocalciferol, (DRISDOL) 50000 UNITS CAPS capsule Take 50,000 Units by mouth every Monday.    Yes Historical Provider, MD  Cyanocobalamin (VITAMIN B-12 IJ) Inject as directed every 30 (thirty) days. Administered by Dr. York Ram around the 1st of each month    Historical Provider, MD  megestrol (MEGACE) 40 MG/ML suspension Take 10 mLs (400 mg total) by mouth daily. Patient not taking: Reported on 04/15/2015 04/06/15   Hosie Poisson, MD  metoprolol (LOPRESSOR) 50 MG tablet Take 1 tablet by mouth 2 (two) times daily. 04/08/15   Historical Provider, MD  metoprolol tartrate (LOPRESSOR) 25 MG tablet Take 1 tablet by mouth daily. 04/27/15   Historical Provider, MD  olmesartan (BENICAR) 40 MG tablet Take 1 tablet (40 mg total) by mouth daily. Patient not taking: Reported on 04/28/2015 03/12/15   Oswald Hillock, MD  omeprazole (PRILOSEC) 20 MG capsule Take 1 capsule (20 mg total) by mouth 2 (two) times daily before a meal. Patient not taking: Reported on 04/28/2015  03/26/15   Tyler Pita, MD  ondansetron (ZOFRAN-ODT) 8 MG disintegrating tablet Take 1 tablet (8 mg total) by mouth every 6 (six) hours as needed for nausea or vomiting. 04/28/15   Truitt Merle, MD  OxyCODONE (OXYCONTIN) 20 mg T12A 12 hr tablet Take 1 tablet (20 mg total) by mouth every 12 (twelve) hours. 04/28/15   Truitt Merle, MD  OxyCODONE 30 MG T12A Take 30 mg by mouth every 12 (twelve) hours. Patient not taking: Reported on 04/14/2015 04/06/15   Hosie Poisson, MD  Oxycodone HCl 10 MG TABS Take 1-2 tablet (10 mg total) by mouth every 4 (four) hours as needed for moderate pain. Patient not taking: Reported on 04/28/2015 04/28/15   Truitt Merle, MD  senna-docusate (SENOKOT-S) 8.6-50 MG per tablet Take 2 tablets by mouth 2 (two) times daily. Patient not taking: Reported on 04/28/2015 03/12/15   Oswald Hillock, MD   BP 159/76 mmHg  Pulse 98  Temp(Src) 99.2 F (37.3 C) (Rectal)  Resp 16  SpO2 99% Physical Exam  Constitutional: She is oriented to person, place, and time. She appears well-developed and well-nourished. She appears distressed.  Pt lying in exam bed, curling in sheets, shivering. Holding emesis bag, gagging.   HENT:  Head: Normocephalic and atraumatic.  Eyes: Conjunctivae are normal. No scleral icterus.  Neck: Normal range of motion. Neck supple.  Cardiovascular: Regular rhythm and normal heart sounds.  Tachycardia present.   Pulmonary/Chest: Effort normal and breath sounds normal. No respiratory distress. She has no wheezes. She has no rales. She exhibits no tenderness.  Abdominal: Soft. Bowel sounds are normal. She exhibits no distension and no mass. There is no tenderness. There is no rebound and no guarding.  Musculoskeletal: Normal range of motion.  Neurological: She is alert and oriented to person, place, and time. No cranial nerve deficit. Coordination normal.  Skin: Skin is warm and dry. She is not diaphoretic.  Nursing note and vitals reviewed.   ED Course  Procedures (including critical  care time) Labs Review Labs Reviewed  COMPREHENSIVE METABOLIC PANEL - Abnormal; Notable for the following:    Glucose, Bld 113 (*)    Calcium 8.7 (*)  Total Protein 6.4 (*)    Albumin 3.2 (*)    ALT 10 (*)    Alkaline Phosphatase 464 (*)    All other components within normal limits  CBC WITH DIFFERENTIAL/PLATELET - Abnormal; Notable for the following:    RBC 3.19 (*)    Hemoglobin 9.0 (*)    HCT 28.4 (*)    RDW 16.2 (*)    Platelets 69 (*)    Neutrophils Relative % 79 (*)    All other components within normal limits  URINALYSIS, ROUTINE W REFLEX MICROSCOPIC (NOT AT [facility]) - Abnormal; Notable for the following:    Specific Gravity, Urine 1.004 (*)    All other components within normal limits    Imaging Review No results found.   EKG Interpretation None      MDM   Final diagnoses:  None    Pt is a 64yo female with hx of metastatic lung cancer, presenting to ED with generalized body aches, 10/10 in severity, associated nausea, chills, and urinary symptoms.  Vitals: mild tachycardia with HR 102 upon arrival, pt afebrile.  Medical records reviewed from earlier today from oncology, pt was prescribed oxycodone and ER oxycodone.  Pt is scheduled to start chemo class this week for chem ot start next week on 6/15 with Taxol and Avastin.    Pt given IV dilaudid and zofran. Will get repeat labs including CBC, CMP, and UA.  Plan is to manage pt's pain and nausea. Labs: CBC & CMP- unremarkable, c/w previous  UA: unremarkable   Discussed pt with Dr. Mingo Amber, will get CXR and consult with oncology.    1:30 AM Consulted with Dr. Alvy Bimler, CXR is still pending.  Regardless of CXR, if pt's pain cannot be controlled with IV narcotics, recommend pt be admitted to observation as pharmacy does not open until at least 9AM for pt to get her prescribed oxycodone.    Pt signed out to Charlann Lange, PA-C at shift change. Plan is to f/u on CXR.  If pneumonia, admit to medicine and tx  appropriately. If no acute findings, pt to be admitted to observation for pain control.     Noland Fordyce, PA-C 04/29/15 [place], MD 04/29/15 [phone]

## 2015-04-28 NOTE — Progress Notes (Signed)
.  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   02/24/2015 Imaging CT chest, abdomen and pelvis showed diffuse bone metastasis throughout the whole spine, sternum, and some of the ribs, skull, and pelvis. brain MRI was negative for metastasis   03/04/2015 Pathology Results left iliac crest lytic bone lesion biopsy showed metastatic adenocarcinoma with abundant mucin, the morphology and IHC are consistent with her prior lung adenocarcinoma.   03/08/2015 - 03/12/2015 Hospital Admission she was admitted for intractable nausea, vomiting, and worsening pain. She was seen by radiation oncologist Dr. Sondra Come during her stay, and her pain medication was adjusted.   03/19/2015 - 04/07/2015 Radiation Therapy palliative radiation to left pelvis, 30 gray in 10 fractions   03/27/2015 Miscellaneous her tumor PD-L1 IHC (-)    Metastatic primary lung cancer   02/11/2015 Initial Diagnosis Metastatic primary lung cancer     HISTORY OF PRESENTING ILLNESS:  [name] [name] 64 y.o. female  with multiple comorbidities including untreatedihepatitis C , stage 1 right lung cancer status post lobectomy in 2014 , who was referred to our clinic because of  her recent abnormal MRI lumbar spine findings, which is highly suspicious for metastatic cancer.  She had lumber spine infusion and screws placement in sep 2014 for her back pain, which improved  after surgery but she has had some residual back pain since then but was tolerable. She reprots worseing back for the past 4-5 month, and it bacame unbearable in the past few weeks and she presented to ED on 02/02/15. Lumber MRI was done which showed diffuse bone lesions which was highly syuspecious for metastases. She also developed worsening left leg weakness in the past 2 months, able to walk independently, and mild numbness at left foot. She has been limping lately due to the back pain, no urine or stool incontinuce. She takes oxycodone twice daily which was prescribed by her primary care physician.    She lost some of appetie, lost 15 lb in the last year, No fever or chills. (+) cough with yellow sputum, and some chest discomfort, no dyspnea on exertion.  She lives with her son, she is able to take care of herself, but not much else. She spends most of time sitting or laying down during daytime.   Her last mammogram was one years ago, last colonoscopy one years ago,  which were all normal normal per patient.   INTERIM HISTORY: She returns for follow-up. She missed her appointments last week, including Doppler, and she was seen at the emergency room for leg swollen, Doppler was negative. She presented her relatives in [place] a few weeks ago, was admitted to local hospital for pain and nausea. She presents to the clinic with her daughter today. Her OxyContin 30 mg needs preauthorization, and she is currently taking oxycodone 10-30 mg every 4-6 hours as needed, she takes average 4-6 pills a day. She complains diffuse body pain, especially in the pelvic area. Her appetite is still poor, she eats  3 meals. She has intermittent mild nausea. She appears to be little agitated and manic in the clinic today, talking non-stopping, very inpatient with her daughter.   MEDICAL HISTORY:  Past Medical History  Diagnosis Date  . Hypertension   . Bipolar 1 disorder   . Heart murmur   . Sciatica   . Arthritis   .  Anxiety   . Diverticulosis   . Chronic low back pain   . DDD (degenerative disc disease), lumbar   . Chronic shoulder pain   . Chronic neck pain   . Hepatitis B     Unclear when initially diagnosed, labs in Epic from 03/09/13  . Hepatitis C     Unclear when initially diagnosed, labs in Epic from 03/09/13  . C. difficile diarrhea     April and February 2014  . COPD (chronic obstructive pulmonary disease)   . GERD (gastroesophageal reflux disease)   . H/O hiatal hernia   . Headache(784.0)     hx  . PONV (postoperative nausea and vomiting)   . Seizures     on meds  . Diabetes mellitus   . Adenocarcinoma of lung, stage 1     SURGICAL HISTORY: Past Surgical History  Procedure Laterality Date  . Facial tumor removal Right 2000  . Brain tumor removal  09  . Brain surgery      in [place]  . Video bronchoscopy  12/04/2012    Procedure: VIDEO BRONCHOSCOPY;  Surgeon: Grace Isaac, MD;  Location: [facility] OR;  Service: Thoracic;  Laterality: N/A;  . Video assisted thoracoscopy (vats)/wedge resection  12/04/2012    Procedure: VIDEO ASSISTED THORACOSCOPY (VATS)/WEDGE RESECTION;  Surgeon: Grace Isaac, MD;  Location: [facility];  Service: Thoracic;  Laterality: Right;  . Lobectomy  12/04/2012    Procedure: LOBECTOMY;  Surgeon: Grace Isaac, MD;  Location: [facility];  Service: Thoracic;  Laterality: Right;  completion of right upper lobectomy and lymph node disection, placement of on q pump  . Tubal ligation    . Tonsillectomy    . Anterior cervical decomp/discectomy fusion N/A 03/05/2014    Procedure: ANTERIOR CERVICAL DECOMPRESSION/DISCECTOMY FUSION 2 LEVELS;  Surgeon: Sinclair Ship, MD;  Location: [facility];  Service: Orthopedics;  Laterality: N/A;  Anterior cervical decompression fusion, cervical 4-5, cervical 5-6 with instrumentation, allograft.    SOCIAL HISTORY: History   Social History  . Marital Status: Widowed    Spouse Name: N/A  . Number of Children: 5  . Years of  Education: N/A   Occupational History  . n/a     patient draws [employer]   Social History Main Topics  . Smoking status: Former Smoker -- 0.25 packs/day for 10 years    Types: Cigarettes    Quit date: 07/07/2013  . Smokeless tobacco: Never Used     Comment: Smokes pk q 3 days   marajuna aug  . Alcohol Use: No     Comment: quit 31yr ago  . Drug Use: Yes    Special: Hydrocodone, Marijuana     Comment: "maybe about once a month"  . Sexual Activity: No   Other Topics Concern  . Not on file   Social History Narrative    FAMILY HISTORY: Family History  Problem Relation Age of Onset  . Hypertension Father     deceased  . Diabetes Father   . Heart disease Father   . Hyperlipidemia Father   . Hypertension Mother   . Hyperlipidemia Mother   .  Diabetes Mother   . Hypertension Sister   . Hyperlipidemia Sister   . Hypertension Brother   . Hyperlipidemia Brother     ALLERGIES:  is allergic to haldol; acetaminophen; ibuprofen; metronidazole; morphine and related; and penicillins.  MEDICATIONS:  Current Outpatient Prescriptions  Medication Sig Dispense Refill  . ALPRAZolam (XANAX) 1 MG tablet Take 0.5 mg by mouth 2 (two) times daily.     . Cyanocobalamin (VITAMIN B-12 IJ) Inject as directed every 30 (thirty) days. Administered by Dr. York Ram around the 1st of each month    . dexamethasone (DECADRON) 4 MG tablet Take 1 tablet (4 mg total) by mouth daily. 30 tablet 2  . dicyclomine (BENTYL) 20 MG tablet Take 20 mg by mouth 3 (three) times daily with meals.    . fluconazole (DIFLUCAN) 100 MG tablet Take 1 tablet (100 mg total) by mouth daily. 28 tablet 0  . folic acid (FOLVITE) 1 MG tablet Take 1 tablet (1 mg total) by mouth daily. 30 tablet 3  . gabapentin (NEURONTIN) 400 MG capsule Take 400 mg by mouth 3 (three) times daily. Nerve pain    . Ipratropium-Albuterol (COMBIVENT RESPIMAT) 20-100 MCG/ACT AERS respimat Inhale 2 puffs into the lungs daily as needed for wheezing or  shortness of breath.     Marland Kitchen KLOR-CON M20 20 MEQ tablet Take 20 mEq by mouth every morning. with food  6  . loratadine (CLARITIN) 10 MG tablet Take 10 mg by mouth every morning.     . magnesium hydroxide (MILK OF MAGNESIA) 400 MG/5ML suspension Take 5 mLs by mouth daily as needed for mild constipation. 118 mL 0  . megestrol (MEGACE) 40 MG/ML suspension Take 10 mLs (400 mg total) by mouth daily. (Patient not taking: Reported on 04/15/2015) 240 mL 0  . Menthol-Methyl Salicylate (MUSCLE RUB) 10-15 % CREA Apply 1 application topically daily as needed for muscle pain. Left shoulder pain    . metFORMIN (GLUCOPHAGE) 500 MG tablet Take 1 tablet by mouth 2 (two) times daily.  6  . metoCLOPramide (REGLAN) 5 MG tablet Take 1 tablet (5 mg total) by mouth 3 (three) times daily before meals. 30 tablet 2  . metoprolol (LOPRESSOR) 50 MG tablet Take 1 tablet by mouth 2 (two) times daily.  2  . olmesartan (BENICAR) 40 MG tablet Take 1 tablet (40 mg total) by mouth daily. 30 tablet 2  . omeprazole (PRILOSEC) 20 MG capsule Take 1 capsule (20 mg total) by mouth 2 (two) times daily before a meal. 60 capsule 5  . ondansetron (ZOFRAN-ODT) 8 MG disintegrating tablet Take 1 tablet by mouth every 6 (six) hours as needed for nausea or vomiting.     . OXcarbazepine (TRILEPTAL) 150 MG tablet Take 150 mg by mouth 3 (three) times daily.     . OxyCODONE 30 MG T12A Take 30 mg by mouth every 12 (twelve) hours. (Patient not taking: Reported on 04/14/2015) 20 tablet 0  . Oxycodone HCl 10 MG TABS Take 1-2 tablet (10 mg total) by mouth every 4 (four) hours as needed for moderate pain. 90 tablet 0  . senna-docusate (SENOKOT-S) 8.6-50 MG per tablet Take 2 tablets by mouth 2 (two) times daily. 30 tablet 2  . simvastatin (ZOCOR) 20 MG tablet Take 20 mg by mouth at bedtime.     Marland Kitchen tiZANidine (ZANAFLEX) 4 MG tablet Take 4 mg by mouth every 8 (eight) hours as needed for muscle spasms.    Marland Kitchen triamcinolone cream (KENALOG) 0.1 % Apply 1 application  topically 2 (two) times daily as needed (for rash).     . Vitamin D, Ergocalciferol, (DRISDOL) 50000 UNITS CAPS capsule Take 50,000 Units by mouth every Monday.      No current facility-administered medications for this visit.    REVIEW OF SYSTEMS:   Constitutional: Denies fevers, chills or abnormal night sweats, (+) malaise and weight loss  Eyes: Denies blurriness of vision, double vision or watery eyes Ears, nose, mouth, throat, and face: Denies mucositis or sore throat Respiratory:(+) cpugh, no dyspnea or wheezes Cardiovascular: Denies palpitation, chest discomfort or lower extremity swelling Gastrointestinal:  Denies nausea, heartburn or change in bowel habits Skin: Denies abnormal skin rashes Lymphatics: Denies new lymphadenopathy or easy bruising Neurological:Denies numbness, tingling or new weaknesses Behavioral/Psych: Mood is stable, no new changes  All other systems were reviewed with the patient and are negative.  PHYSICAL EXAMINATION: ECOG PERFORMANCE STATUS: 1-2  Filed Vitals:   04/28/15 1131  BP: 116/58  Pulse: 102  Temp: 98.9 F (37.2 C)  Resp: 20   Filed Weights   04/28/15 1131  Weight: 123 lb 4.8 oz (55.929 kg)    GENERAL:alert, not in acute distress. SKIN: skin color, texture, turgor are normal, no rashes or significant lesions. EYES: normal, conjunctiva are pink and non-injected, sclera clear OROPHARYNX:no exudate, no erythema and lips, buccal mucosa, and tongue normal  NECK: supple, thyroid normal size, non-tender, without nodularity LYMPH:  no palpable lymphadenopathy in the cervical, axillary or inguinal LUNGS: clear to auscultation and percussion with normal breathing effort HEART: regular rate & rhythm and no murmurs and no lower extremity edema ABDOMEN:abdomen soft, non-tender and normal bowel sounds Musculoskeletal:no cyanosis of digits and no clubbing no significant tenderness on spine  PSYCH: alert & oriented x 3 with fluent speech NEURO: no  focal motor/sensory deficits LEG: (+) Pitting edema on both feet, (+) calf tendenss    LABORATORY DATA:  I have reviewed the data as listed CBC Latest Ref Rng 04/28/2015 04/15/2015 04/05/2015  WBC 3.9 - 10.3 10e3/uL 6.7 9.8 10.1  Hemoglobin 11.6 - 15.9 g/dL 8.8(L) 9.8(L) 10.5(L)  Hematocrit 34.8 - 46.6 % 27.7(L) 30.8(L) 32.1(L)  Platelets 145 - 400 10e3/uL 74(L) 75(L) 126(L)    CMP Latest Ref Rng 04/28/2015 04/15/2015 04/03/2015  Glucose 70 - 140 mg/dl 116 144(H) 124(H)  BUN 7.0 - 26.0 mg/dL 14._0 Creatinine 0.6 - 1.1 mg/dL 0.7 0.62 0.48  Sodium 136 - 145 mEq/L 141 137 139  Potassium 3.5 - 5.1 mEq/L 3.7 4.0 4.0  Chloride 101 - 111 mmol/L - 101 104  CO2 22 - 29 mEq/L _1 Calcium 8.4 - 10.4 mg/dL 8.6 8.4(L) 8.7(L)  Total Protein 6.4 - 8.3 g/dL 6.0(L) - -  Total Bilirubin 0.20 - 1.20 mg/dL 0.46 - -  Alkaline Phos 40 - 150 U/L 457(H) - -  AST 5 - 34 U/L 13 - -  ALT 0 - 55 U/L <6 - -     Pathology report Bone, biopsy, lytic lesion involving the left iliac crest 03/04/2015 - METASTATIC ADENOCARCINOMA WITH ABUNDANT MUCIN. Microscopic Comment The morphologic features are compatible with metastasis from the previous right upper lobe well-differentiated adenocarcinoma with abundant mucin from 12/12/2012 [phone]). ADDENDUM: At the request of Dr. Burr Medico, immunohistochemistry is performed and the tumor cells are positive with cytokeratin 7, cytokeratin 20, and CDX2. Napsin A, thyroid transcription factor-1, estrogen receptor, progesterone receptor, and gross cystic disease fluid protein are negative. The immunophenotype is similar to the immunophenotype reported for the  previous adenocarcinoma with abundant mucin from the lung.   RADIOGRAPHIC STUDIES: I have personally reviewed the radiological images as listed and agreed with the findings in the report. No new scans   ASSESSMENT & PLAN:  64 year old female, with significant history of smoking and stage I right lung  adenocarcinoma status post lobectomy in 2014, no presented with was any back pain and diffuse bone lesions on the lumbar spine MRI.  1. 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 -I reviewed her bone biopsy results, which showed metastatic adenocarcinoma, pulse morphology and immunostain are consistent with her prior lung adenocarcinoma.  -We discussed that this is incurable disease, and overall prognosis is very poor. -Her lung primary was tested negative for EGFR gene mutation and ALK translocation. I have requested Foundation one test on her prior lung cancer tissue. Her bone biopsy tissue was negative for PDL 1 expression. -Due to her noncompliance, and multiple ED and hospital admission, her chemotherapy has been postponed several times. -She now developed moderate some cytopenia, likely secondary to her diffuse bone metastasis in the radiation. She would not be a candidate for platinum based doublet chemotherapy due to her thrombocytopenia. -I discussed the option of single agent weekly paclitaxel, with all without Avastin, versus palliative care with hospice. Patient strongly voiced that she does not want to die with hospice, and will like to try chemotherapy first. Her daughter voiced the concern that she may not be able to come in every week for the treatment and follow-up due to the transportation. -She agrees to move and live with her daughter.  --Chemotherapy consent: Side effects including but does not not limited to, fatigue, nausea, vomiting, diarrhea, hair loss, neuropathy, fluid retention, renal and kidney dysfunction, neutropenic fever, needed for blood transfusion, bleeding, were discussed with patient in great detail. She agrees to proceed. -I'll tentatively schedule her for chemotherapy class this week and start weekly Taxol and Avastin next Wednesday.  2.  Left low back and pelvic pain secondary to  prior lumbar surgery and bone metastasis -She has diffuse bone metastasis in spine, with the worst at T12 vertebral body,  No cold compression on scan. However she is not symptomatically from T12 lesion -She is on palliative radiation to the left pelvis for metastatic bone related pain -She did not tolerate morphine, with skin itchiness. -Continue OxyContin to 20 mg every 12 hours, I given her a new prescription today  -Continue oxycodone as needed, I reviewed for her today. -I'll consider Xgeva when she started systemic treatment. She agrees to see her dentist and have her teeth cleaned and checked out.  3.  Untreated hepatitis C,  Hypertension, diabetes, bipolar - She'll continue follow-up with her primary care physician  4. Feet edema -Doppler was negative for DVT.  5. Intermittent nausea and vomiting, unknown etiology -She was admitted to hospital twice for nausea and vomiting, workup was negative. -She has no nausea vomiting now.  6. Frequent hospital admission and ED visit  -I encouraged her to contact my office for any acute new symptoms, we'll be happy to see her in our symptom management clinic.    Plan -I gave her a prescription of extensor release oxycodone 20 mg every 12 hours today, and reviewed her oxycodone -continue oxycodone 10-20 mg every 4 hours as needed -Starting chemotherapy with weekly Taxol and Avastin on 6/15.  -chemo class this week  -Nutritional consult and social work  referral  All questions were answered. The patient knows to call the clinic with any problems, questions or concerns. I spent 30 minutes counseling the patient face to face. The total time spent in the appointment was 40 minutes and more than 50% was on counseling.    Truitt Merle, MD 04/28/2015 11:55 AM

## 2015-04-28 NOTE — Telephone Encounter (Signed)
Gave and printed appt sched and avs for pt for June °

## 2015-04-28 NOTE — Telephone Encounter (Signed)
Gave and printed appt sched and avs for pt for JUNE °

## 2015-04-28 NOTE — ED Notes (Signed)
DAUGHTERLorin Picket [phone] (c)

## 2015-04-28 NOTE — ED Notes (Signed)
Patient stated unable to give urine right now.

## 2015-04-29 ENCOUNTER — Encounter (HOSPITAL_COMMUNITY): Payer: Self-pay | Admitting: *Deleted

## 2015-04-29 ENCOUNTER — Emergency Department (HOSPITAL_COMMUNITY): Payer: Medicare HMO

## 2015-04-29 ENCOUNTER — Inpatient Hospital Stay (HOSPITAL_COMMUNITY)
Admission: EM | Admit: 2015-04-29 | Discharge: 2015-05-07 | DRG: 947 | Disposition: A | Discharge status: Home / Self Care | Payer: Medicare HMO | Attending: Internal Medicine | Admitting: Internal Medicine

## 2015-04-29 ENCOUNTER — Encounter: Payer: Self-pay | Admitting: Hematology

## 2015-04-29 ENCOUNTER — Other Ambulatory Visit: Payer: Self-pay

## 2015-04-29 ENCOUNTER — Telehealth: Payer: Self-pay | Admitting: Hematology

## 2015-04-29 DIAGNOSIS — F32A Depression, unspecified: Secondary | ICD-10-CM | POA: Diagnosis present

## 2015-04-29 DIAGNOSIS — E872 Acidosis, unspecified: Secondary | ICD-10-CM

## 2015-04-29 DIAGNOSIS — E876 Hypokalemia: Secondary | ICD-10-CM

## 2015-04-29 DIAGNOSIS — I10 Essential (primary) hypertension: Secondary | ICD-10-CM | POA: Diagnosis present

## 2015-04-29 DIAGNOSIS — R109 Unspecified abdominal pain: Secondary | ICD-10-CM | POA: Diagnosis present

## 2015-04-29 DIAGNOSIS — K5903 Drug induced constipation: Secondary | ICD-10-CM | POA: Insufficient documentation

## 2015-04-29 DIAGNOSIS — M48061 Spinal stenosis, lumbar region without neurogenic claudication: Secondary | ICD-10-CM | POA: Diagnosis present

## 2015-04-29 DIAGNOSIS — B1920 Unspecified viral hepatitis C without hepatic coma: Secondary | ICD-10-CM | POA: Diagnosis present

## 2015-04-29 DIAGNOSIS — M549 Dorsalgia, unspecified: Secondary | ICD-10-CM

## 2015-04-29 DIAGNOSIS — E860 Dehydration: Secondary | ICD-10-CM

## 2015-04-29 DIAGNOSIS — E119 Type 2 diabetes mellitus without complications: Secondary | ICD-10-CM

## 2015-04-29 DIAGNOSIS — R6510 Systemic inflammatory response syndrome (SIRS) of non-infectious origin without acute organ dysfunction: Secondary | ICD-10-CM | POA: Diagnosis present

## 2015-04-29 DIAGNOSIS — G8929 Other chronic pain: Secondary | ICD-10-CM

## 2015-04-29 DIAGNOSIS — C3410 Malignant neoplasm of upper lobe, unspecified bronchus or lung: Secondary | ICD-10-CM

## 2015-04-29 DIAGNOSIS — F319 Bipolar disorder, unspecified: Secondary | ICD-10-CM | POA: Diagnosis present

## 2015-04-29 DIAGNOSIS — E440 Moderate protein-calorie malnutrition: Secondary | ICD-10-CM | POA: Insufficient documentation

## 2015-04-29 DIAGNOSIS — E1165 Type 2 diabetes mellitus with hyperglycemia: Secondary | ICD-10-CM

## 2015-04-29 DIAGNOSIS — F419 Anxiety disorder, unspecified: Secondary | ICD-10-CM | POA: Diagnosis present

## 2015-04-29 DIAGNOSIS — F329 Major depressive disorder, single episode, unspecified: Secondary | ICD-10-CM | POA: Diagnosis present

## 2015-04-29 DIAGNOSIS — K219 Gastro-esophageal reflux disease without esophagitis: Secondary | ICD-10-CM | POA: Diagnosis present

## 2015-04-29 DIAGNOSIS — Z515 Encounter for palliative care: Secondary | ICD-10-CM | POA: Insufficient documentation

## 2015-04-29 DIAGNOSIS — E43 Unspecified severe protein-calorie malnutrition: Secondary | ICD-10-CM | POA: Diagnosis present

## 2015-04-29 DIAGNOSIS — F411 Generalized anxiety disorder: Secondary | ICD-10-CM | POA: Diagnosis present

## 2015-04-29 DIAGNOSIS — M792 Neuralgia and neuritis, unspecified: Secondary | ICD-10-CM | POA: Insufficient documentation

## 2015-04-29 DIAGNOSIS — B1910 Unspecified viral hepatitis B without hepatic coma: Secondary | ICD-10-CM | POA: Diagnosis present

## 2015-04-29 DIAGNOSIS — C7951 Secondary malignant neoplasm of bone: Secondary | ICD-10-CM | POA: Diagnosis present

## 2015-04-29 DIAGNOSIS — T402X5A Adverse effect of other opioids, initial encounter: Secondary | ICD-10-CM | POA: Insufficient documentation

## 2015-04-29 LAB — CBC WITH DIFFERENTIAL/PLATELET
Basophils Absolute: 0.1 10*3/uL (ref 0.0–0.1)
Basophils Relative: 1 % (ref 0–1)
Eosinophils Absolute: 0.1 10*3/uL (ref 0.0–0.7)
Eosinophils Relative: 1 % (ref 0–5)
HCT: 28.4 % — ABNORMAL LOW (ref 36.0–46.0)
Hemoglobin: 9 g/dL — ABNORMAL LOW (ref 12.0–15.0)
Lymphocytes Relative: 13 % (ref 12–46)
Lymphs Abs: 1 10*3/uL (ref 0.7–4.0)
MCH: 28.2 pg (ref 26.0–34.0)
MCHC: 31.7 g/dL (ref 30.0–36.0)
MCV: 89 fL (ref 78.0–100.0)
Monocytes Absolute: 0.5 10*3/uL (ref 0.1–1.0)
Monocytes Relative: 6 % (ref 3–12)
Neutro Abs: 6.2 10*3/uL (ref 1.7–7.7)
Neutrophils Relative %: 79 % — ABNORMAL HIGH (ref 43–77)
Platelets: 69 10*3/uL — ABNORMAL LOW (ref 150–400)
RBC: 3.19 MIL/uL — ABNORMAL LOW (ref 3.87–5.11)
RDW: 16.2 % — ABNORMAL HIGH (ref 11.5–15.5)
WBC: 7.9 10*3/uL (ref 4.0–10.5)

## 2015-04-29 LAB — URINALYSIS, ROUTINE W REFLEX MICROSCOPIC
Bilirubin Urine: NEGATIVE
Glucose, UA: NEGATIVE mg/dL
Hgb urine dipstick: NEGATIVE
Ketones, ur: NEGATIVE mg/dL
Leukocytes, UA: NEGATIVE
Nitrite: NEGATIVE
Protein, ur: NEGATIVE mg/dL
Specific Gravity, Urine: 1.004 — ABNORMAL LOW (ref 1.005–1.030)
Urobilinogen, UA: 0.2 mg/dL (ref 0.0–1.0)
pH: 7.5 (ref 5.0–8.0)

## 2015-04-29 MED ORDER — HYDROMORPHONE HCL 1 MG/ML IJ SOLN
1.0000 mg | Freq: Once | INTRAMUSCULAR | Status: AC
Start: 2015-04-29 — End: 2015-04-29
  Administered 2015-04-29: 1 mg via INTRAVENOUS
  Filled 2015-04-29: qty 1

## 2015-04-29 MED ORDER — HEPARIN SOD (PORK) LOCK FLUSH 100 UNIT/ML IV SOLN
500.0000 [IU] | Freq: Once | INTRAVENOUS | Status: AC
  Administered 2015-04-29: 500 [IU]
  Filled 2015-04-29: qty 5

## 2015-04-29 MED ORDER — HYDROMORPHONE HCL 1 MG/ML IJ SOLN
1.0000 mg | Freq: Once | INTRAMUSCULAR | Status: AC
  Administered 2015-04-29: 1 mg via INTRAVENOUS
  Filled 2015-04-29: qty 1

## 2015-04-29 MED ORDER — OXYCODONE HCL ER 10 MG PO T12A
20.0000 mg | EXTENDED_RELEASE_TABLET | Freq: Two times a day (BID) | ORAL | Status: DC
  Administered 2015-04-29: 20 mg via ORAL
  Filled 2015-04-29: qty 2

## 2015-04-29 NOTE — Telephone Encounter (Signed)
per pof to sch pt appt nuirition appt-cld daughter [name] and left message w/appt time & date

## 2015-04-29 NOTE — ED Notes (Signed)
Patient transported to X-ray 

## 2015-04-29 NOTE — ED Notes (Signed)
Pt waiting in room for daughter to pick her up, daughter on way.

## 2015-04-29 NOTE — ED Notes (Signed)
Pt arrives to the ER from home via EMS for complaints of unable to control pain at home; pt was seen last night for same thing; pt states that she has been taking Oxycontin and Oxycodone at home without relief; pt restless and moving all over stretcher; pt states that she called the Cancer center today and got more Oxycontin.

## 2015-04-29 NOTE — ED Notes (Signed)
R chest port deaccessed. CDI.

## 2015-04-29 NOTE — Discharge Instructions (Signed)

## 2015-04-29 NOTE — ED Notes (Signed)
Pts daughter called and left message for he to pick mother up.

## 2015-04-29 NOTE — Progress Notes (Signed)
I advised Dr. Birdie Riddle has denied Oxycodone '30mg'$ . They need to know all meds tried and failed. They mentioned fentanyl patches and if they were tried and found ineffective.

## 2015-04-29 NOTE — ED Provider Notes (Signed)
The patient was initially seen by Noland Fordyce, PA-C, and care was transferred to me at the end of shift. She is here for management of generalized body aches/pain thought secondary to treatment for cancer (lung with mets to bone). She had run out of her pain medication but was seen yesterday and had refills provided. NO fever.  Pain has been somewhat difficult to control in the ED but she has been observed over time and her usual pain medications given. VSS. She has not appeared to be in any distress. She has been seen and evaluated by Dr. Evelina Bucy and found to be stable for discharge home.   Charlann Lange, PA-C 04/29/15 0533  Evelina Bucy, MD 04/29/15 [phone]

## 2015-04-30 ENCOUNTER — Encounter (HOSPITAL_COMMUNITY): Payer: Self-pay

## 2015-04-30 ENCOUNTER — Emergency Department (HOSPITAL_COMMUNITY): Payer: Medicare HMO

## 2015-04-30 DIAGNOSIS — R05 Cough: Secondary | ICD-10-CM | POA: Diagnosis present

## 2015-04-30 DIAGNOSIS — R000 Tachycardia, unspecified: Secondary | ICD-10-CM | POA: Diagnosis present

## 2015-04-30 DIAGNOSIS — C7951 Secondary malignant neoplasm of bone: Secondary | ICD-10-CM

## 2015-04-30 DIAGNOSIS — R011 Cardiac murmur, unspecified: Secondary | ICD-10-CM | POA: Diagnosis present

## 2015-04-30 DIAGNOSIS — L299 Pruritus, unspecified: Secondary | ICD-10-CM | POA: Diagnosis not present

## 2015-04-30 DIAGNOSIS — B1920 Unspecified viral hepatitis C without hepatic coma: Secondary | ICD-10-CM | POA: Diagnosis present

## 2015-04-30 DIAGNOSIS — F329 Major depressive disorder, single episode, unspecified: Secondary | ICD-10-CM | POA: Diagnosis not present

## 2015-04-30 DIAGNOSIS — C3410 Malignant neoplasm of upper lobe, unspecified bronchus or lung: Secondary | ICD-10-CM | POA: Diagnosis not present

## 2015-04-30 DIAGNOSIS — C3411 Malignant neoplasm of upper lobe, right bronchus or lung: Secondary | ICD-10-CM

## 2015-04-30 DIAGNOSIS — D696 Thrombocytopenia, unspecified: Secondary | ICD-10-CM | POA: Diagnosis not present

## 2015-04-30 DIAGNOSIS — F418 Other specified anxiety disorders: Secondary | ICD-10-CM | POA: Diagnosis not present

## 2015-04-30 DIAGNOSIS — E440 Moderate protein-calorie malnutrition: Secondary | ICD-10-CM | POA: Insufficient documentation

## 2015-04-30 DIAGNOSIS — G8929 Other chronic pain: Secondary | ICD-10-CM | POA: Insufficient documentation

## 2015-04-30 DIAGNOSIS — G893 Neoplasm related pain (acute) (chronic): Secondary | ICD-10-CM | POA: Diagnosis present

## 2015-04-30 DIAGNOSIS — Z880 Allergy status to penicillin: Secondary | ICD-10-CM | POA: Diagnosis not present

## 2015-04-30 DIAGNOSIS — E1140 Type 2 diabetes mellitus with diabetic neuropathy, unspecified: Secondary | ICD-10-CM | POA: Diagnosis present

## 2015-04-30 DIAGNOSIS — K5909 Other constipation: Secondary | ICD-10-CM | POA: Diagnosis present

## 2015-04-30 DIAGNOSIS — Z981 Arthrodesis status: Secondary | ICD-10-CM | POA: Diagnosis not present

## 2015-04-30 DIAGNOSIS — M1990 Unspecified osteoarthritis, unspecified site: Secondary | ICD-10-CM | POA: Diagnosis present

## 2015-04-30 DIAGNOSIS — R6510 Systemic inflammatory response syndrome (SIRS) of non-infectious origin without acute organ dysfunction: Secondary | ICD-10-CM | POA: Diagnosis present

## 2015-04-30 DIAGNOSIS — F411 Generalized anxiety disorder: Secondary | ICD-10-CM | POA: Diagnosis present

## 2015-04-30 DIAGNOSIS — Z886 Allergy status to analgesic agent status: Secondary | ICD-10-CM | POA: Diagnosis not present

## 2015-04-30 DIAGNOSIS — Z885 Allergy status to narcotic agent status: Secondary | ICD-10-CM | POA: Diagnosis not present

## 2015-04-30 DIAGNOSIS — M792 Neuralgia and neuritis, unspecified: Secondary | ICD-10-CM | POA: Diagnosis not present

## 2015-04-30 DIAGNOSIS — K5900 Constipation, unspecified: Secondary | ICD-10-CM | POA: Diagnosis not present

## 2015-04-30 DIAGNOSIS — Z833 Family history of diabetes mellitus: Secondary | ICD-10-CM | POA: Diagnosis not present

## 2015-04-30 DIAGNOSIS — Z8249 Family history of ischemic heart disease and other diseases of the circulatory system: Secondary | ICD-10-CM | POA: Diagnosis not present

## 2015-04-30 DIAGNOSIS — F319 Bipolar disorder, unspecified: Secondary | ICD-10-CM | POA: Diagnosis present

## 2015-04-30 DIAGNOSIS — Z902 Acquired absence of lung [part of]: Secondary | ICD-10-CM | POA: Diagnosis present

## 2015-04-30 DIAGNOSIS — M5136 Other intervertebral disc degeneration, lumbar region: Secondary | ICD-10-CM | POA: Diagnosis present

## 2015-04-30 DIAGNOSIS — M545 Low back pain: Secondary | ICD-10-CM | POA: Diagnosis present

## 2015-04-30 DIAGNOSIS — D6959 Other secondary thrombocytopenia: Secondary | ICD-10-CM | POA: Diagnosis present

## 2015-04-30 DIAGNOSIS — Z923 Personal history of irradiation: Secondary | ICD-10-CM | POA: Diagnosis not present

## 2015-04-30 DIAGNOSIS — Z87891 Personal history of nicotine dependence: Secondary | ICD-10-CM | POA: Diagnosis not present

## 2015-04-30 DIAGNOSIS — R1084 Generalized abdominal pain: Secondary | ICD-10-CM | POA: Diagnosis not present

## 2015-04-30 DIAGNOSIS — B1910 Unspecified viral hepatitis B without hepatic coma: Secondary | ICD-10-CM | POA: Diagnosis present

## 2015-04-30 DIAGNOSIS — K5790 Diverticulosis of intestine, part unspecified, without perforation or abscess without bleeding: Secondary | ICD-10-CM | POA: Diagnosis present

## 2015-04-30 DIAGNOSIS — K219 Gastro-esophageal reflux disease without esophagitis: Secondary | ICD-10-CM | POA: Diagnosis present

## 2015-04-30 DIAGNOSIS — M5430 Sciatica, unspecified side: Secondary | ICD-10-CM | POA: Diagnosis present

## 2015-04-30 DIAGNOSIS — Z79899 Other long term (current) drug therapy: Secondary | ICD-10-CM | POA: Diagnosis not present

## 2015-04-30 DIAGNOSIS — E872 Acidosis: Secondary | ICD-10-CM | POA: Diagnosis present

## 2015-04-30 DIAGNOSIS — Z6821 Body mass index (BMI) 21.0-21.9, adult: Secondary | ICD-10-CM | POA: Diagnosis not present

## 2015-04-30 DIAGNOSIS — J449 Chronic obstructive pulmonary disease, unspecified: Secondary | ICD-10-CM | POA: Diagnosis present

## 2015-04-30 DIAGNOSIS — E876 Hypokalemia: Secondary | ICD-10-CM | POA: Diagnosis present

## 2015-04-30 DIAGNOSIS — D630 Anemia in neoplastic disease: Secondary | ICD-10-CM | POA: Diagnosis present

## 2015-04-30 DIAGNOSIS — R109 Unspecified abdominal pain: Secondary | ICD-10-CM | POA: Diagnosis present

## 2015-04-30 DIAGNOSIS — R112 Nausea with vomiting, unspecified: Secondary | ICD-10-CM | POA: Diagnosis present

## 2015-04-30 DIAGNOSIS — E860 Dehydration: Secondary | ICD-10-CM | POA: Insufficient documentation

## 2015-04-30 DIAGNOSIS — Z79891 Long term (current) use of opiate analgesic: Secondary | ICD-10-CM | POA: Diagnosis not present

## 2015-04-30 DIAGNOSIS — C3490 Malignant neoplasm of unspecified part of unspecified bronchus or lung: Secondary | ICD-10-CM | POA: Diagnosis not present

## 2015-04-30 DIAGNOSIS — T402X5A Adverse effect of other opioids, initial encounter: Secondary | ICD-10-CM | POA: Diagnosis present

## 2015-04-30 DIAGNOSIS — D649 Anemia, unspecified: Secondary | ICD-10-CM | POA: Diagnosis not present

## 2015-04-30 DIAGNOSIS — M542 Cervicalgia: Secondary | ICD-10-CM | POA: Diagnosis present

## 2015-04-30 DIAGNOSIS — E43 Unspecified severe protein-calorie malnutrition: Secondary | ICD-10-CM | POA: Diagnosis present

## 2015-04-30 DIAGNOSIS — Z515 Encounter for palliative care: Secondary | ICD-10-CM | POA: Diagnosis not present

## 2015-04-30 DIAGNOSIS — M4806 Spinal stenosis, lumbar region: Secondary | ICD-10-CM | POA: Diagnosis present

## 2015-04-30 DIAGNOSIS — M25519 Pain in unspecified shoulder: Secondary | ICD-10-CM | POA: Diagnosis present

## 2015-04-30 DIAGNOSIS — R569 Unspecified convulsions: Secondary | ICD-10-CM | POA: Diagnosis present

## 2015-04-30 DIAGNOSIS — Z888 Allergy status to other drugs, medicaments and biological substances status: Secondary | ICD-10-CM | POA: Diagnosis not present

## 2015-04-30 DIAGNOSIS — I10 Essential (primary) hypertension: Secondary | ICD-10-CM | POA: Diagnosis present

## 2015-04-30 LAB — CBC WITH DIFFERENTIAL/PLATELET
Basophils Absolute: 0.1 10*3/uL (ref 0.0–0.1)
Basophils Relative: 1 % (ref 0–1)
EOS PCT: 0 % (ref 0–5)
Eosinophils Absolute: 0 10*3/uL (ref 0.0–0.7)
HCT: 33 % — ABNORMAL LOW (ref 36.0–46.0)
HEMOGLOBIN: 11 g/dL — AB (ref 12.0–15.0)
LYMPHS ABS: 1.1 10*3/uL (ref 0.7–4.0)
Lymphocytes Relative: 12 % (ref 12–46)
MCH: 29 pg (ref 26.0–34.0)
MCHC: 33.3 g/dL (ref 30.0–36.0)
MCV: 87.1 fL (ref 78.0–100.0)
MONOS PCT: 6 % (ref 3–12)
Monocytes Absolute: 0.6 10*3/uL (ref 0.1–1.0)
NEUTROS ABS: 7.5 10*3/uL (ref 1.7–7.7)
NEUTROS PCT: 81 % — AB (ref 43–77)
Platelets: 73 10*3/uL — ABNORMAL LOW (ref 150–400)
RBC: 3.79 MIL/uL — AB (ref 3.87–5.11)
RDW: 15.9 % — AB (ref 11.5–15.5)
WBC: 9.3 10*3/uL (ref 4.0–10.5)

## 2015-04-30 LAB — COMPREHENSIVE METABOLIC PANEL
ALBUMIN: 3.8 g/dL (ref 3.5–5.0)
ALT: 12 U/L — ABNORMAL LOW (ref 14–54)
AST: 25 U/L (ref 15–41)
Alkaline Phosphatase: 558 U/L — ABNORMAL HIGH (ref 38–126)
Anion gap: 16 — ABNORMAL HIGH (ref 5–15)
BUN: 16 mg/dL (ref 6–20)
CHLORIDE: 100 mmol/L — AB (ref 101–111)
CO2: 21 mmol/L — ABNORMAL LOW (ref 22–32)
CREATININE: 0.58 mg/dL (ref 0.44–1.00)
Calcium: 9.5 mg/dL (ref 8.9–10.3)
GFR calc Af Amer: >60 mL/min (ref >60)
GLUCOSE: 203 mg/dL — AB (ref 65–99)
POTASSIUM: 2.9 mmol/L — AB (ref 3.5–5.1)
Sodium: 137 mmol/L (ref 135–145)
TOTAL PROTEIN: 7.6 g/dL (ref 6.5–8.1)
Total Bilirubin: 1.4 mg/dL — ABNORMAL HIGH (ref 0.3–1.2)

## 2015-04-30 LAB — URINALYSIS, ROUTINE W REFLEX MICROSCOPIC
BILIRUBIN URINE: NEGATIVE
Glucose, UA: 250 mg/dL — AB
Ketones, ur: NEGATIVE mg/dL
Leukocytes, UA: NEGATIVE
Nitrite: NEGATIVE
PROTEIN: 30 mg/dL — AB
SPECIFIC GRAVITY, URINE: 1.012 (ref 1.005–1.030)
Urobilinogen, UA: 0.2 mg/dL (ref 0.0–1.0)
pH: 7 (ref 5.0–8.0)

## 2015-04-30 LAB — GLUCOSE, CAPILLARY
GLUCOSE-CAPILLARY: 136 mg/dL — AB (ref 65–99)
Glucose-Capillary: 198 mg/dL — ABNORMAL HIGH (ref 65–99)
Glucose-Capillary: 117 mg/dL — ABNORMAL HIGH (ref 65–99)
Glucose-Capillary: 163 mg/dL — ABNORMAL HIGH (ref 65–99)

## 2015-04-30 LAB — URINE MICROSCOPIC-ADD ON

## 2015-04-30 LAB — MAGNESIUM: Magnesium: 1.7 mg/dL (ref 1.7–2.4)

## 2015-04-30 LAB — APTT: aPTT: 27 seconds (ref 24–37)

## 2015-04-30 LAB — LACTIC ACID, PLASMA
LACTIC ACID, VENOUS: 4.4 mmol/L — AB (ref 0.5–2.0)
Lactic Acid, Venous: 2.8 mmol/L (ref 0.5–2.0)
Lactic Acid, Venous: 3.3 mmol/L (ref 0.5–2.0)
Lactic Acid, Venous: 1.5 mmol/L (ref 0.5–2.0)

## 2015-04-30 LAB — PROTIME-INR
INR: 1.54 — ABNORMAL HIGH (ref 0.00–1.49)
Prothrombin Time: 18.5 seconds — ABNORMAL HIGH (ref 11.6–15.2)

## 2015-04-30 LAB — LIPASE, BLOOD: LIPASE: 11 U/L — AB (ref 22–51)

## 2015-04-30 LAB — PROCALCITONIN: Procalcitonin: <0.1 ng/mL

## 2015-04-30 MED ORDER — METOPROLOL TARTRATE 25 MG PO TABS
25.0000 mg | ORAL_TABLET | Freq: Every day | ORAL | Status: DC
  Administered 2015-04-30 – 2015-05-07 (×8): 25 mg via ORAL
  Filled 2015-04-30 (×8): qty 1

## 2015-04-30 MED ORDER — INSULIN ASPART 100 UNIT/ML ~~LOC~~ SOLN
0.0000 [IU] | Freq: Three times a day (TID) | SUBCUTANEOUS | Status: DC
  Administered 2015-04-30 (×2): 2 [IU] via SUBCUTANEOUS
  Administered 2015-05-01 – 2015-05-03 (×5): 1 [IU] via SUBCUTANEOUS
  Administered 2015-05-04: 2 [IU] via SUBCUTANEOUS
  Administered 2015-05-05 – 2015-05-07 (×4): 1 [IU] via SUBCUTANEOUS

## 2015-04-30 MED ORDER — ONDANSETRON HCL 4 MG/2ML IJ SOLN
4.0000 mg | Freq: Four times a day (QID) | INTRAMUSCULAR | Status: DC | PRN
  Administered 2015-05-02 – 2015-05-04 (×2): 4 mg via INTRAVENOUS
  Filled 2015-04-30 (×2): qty 2

## 2015-04-30 MED ORDER — ONDANSETRON HCL 4 MG/2ML IJ SOLN
4.0000 mg | Freq: Three times a day (TID) | INTRAMUSCULAR | Status: DC | PRN
  Administered 2015-04-30: 4 mg via INTRAVENOUS
  Filled 2015-04-30: qty 2

## 2015-04-30 MED ORDER — METOCLOPRAMIDE HCL 5 MG PO TABS
5.0000 mg | ORAL_TABLET | Freq: Three times a day (TID) | ORAL | Status: DC
  Administered 2015-04-30 – 2015-05-07 (×22): 5 mg via ORAL
  Filled 2015-04-30 (×24): qty 1

## 2015-04-30 MED ORDER — HYDRALAZINE HCL 20 MG/ML IJ SOLN
10.0000 mg | Freq: Once | INTRAMUSCULAR | Status: AC
  Administered 2015-04-30: 10 mg via INTRAVENOUS
  Filled 2015-04-30: qty 1

## 2015-04-30 MED ORDER — SODIUM CHLORIDE 0.9 % IJ SOLN
3.0000 mL | Freq: Two times a day (BID) | INTRAMUSCULAR | Status: DC
  Administered 2015-05-02 – 2015-05-06 (×3): 3 mL via INTRAVENOUS

## 2015-04-30 MED ORDER — IPRATROPIUM-ALBUTEROL 0.5-2.5 (3) MG/3ML IN SOLN: 3.0000 mL | Freq: Every day | RESPIRATORY_TRACT | Status: DC | PRN

## 2015-04-30 MED ORDER — DEXAMETHASONE 4 MG PO TABS
4.0000 mg | ORAL_TABLET | Freq: Every day | ORAL | Status: DC
  Administered 2015-04-30 – 2015-05-07 (×8): 4 mg via ORAL
  Filled 2015-04-30 (×8): qty 1

## 2015-04-30 MED ORDER — OXCARBAZEPINE 150 MG PO TABS
150.0000 mg | ORAL_TABLET | Freq: Three times a day (TID) | ORAL | Status: DC
  Administered 2015-04-30 – 2015-05-07 (×22): 150 mg via ORAL
  Filled 2015-04-30 (×23): qty 1

## 2015-04-30 MED ORDER — PROMETHAZINE HCL 25 MG PO TABS
25.0000 mg | ORAL_TABLET | Freq: Four times a day (QID) | ORAL | Status: DC | PRN
  Administered 2015-04-30 – 2015-05-06 (×6): 25 mg via ORAL
  Filled 2015-04-30 (×6): qty 1

## 2015-04-30 MED ORDER — GABAPENTIN 400 MG PO CAPS
400.0000 mg | ORAL_CAPSULE | Freq: Three times a day (TID) | ORAL | Status: DC
  Administered 2015-04-30 – 2015-05-07 (×22): 400 mg via ORAL
  Filled 2015-04-30 (×23): qty 1

## 2015-04-30 MED ORDER — NICOTINE 21 MG/24HR TD PT24
21.0000 mg | MEDICATED_PATCH | Freq: Every day | TRANSDERMAL | Status: DC
  Administered 2015-04-30 – 2015-05-07 (×8): 21 mg via TRANSDERMAL
  Filled 2015-04-30 (×8): qty 1

## 2015-04-30 MED ORDER — FOLIC ACID 1 MG PO TABS
1.0000 mg | ORAL_TABLET | Freq: Every day | ORAL | Status: DC
  Administered 2015-04-30 – 2015-05-07 (×8): 1 mg via ORAL
  Filled 2015-04-30 (×8): qty 1

## 2015-04-30 MED ORDER — IOHEXOL 300 MG/ML  SOLN
100.0000 mL | Freq: Once | INTRAMUSCULAR | Status: AC | PRN
  Administered 2015-04-30: 100 mL via INTRAVENOUS

## 2015-04-30 MED ORDER — DIPHENHYDRAMINE HCL 50 MG/ML IJ SOLN
25.0000 mg | Freq: Four times a day (QID) | INTRAMUSCULAR | Status: DC | PRN
  Administered 2015-05-02: 25 mg via INTRAVENOUS
  Filled 2015-04-30: qty 1

## 2015-04-30 MED ORDER — SODIUM CHLORIDE 0.9 % IV BOLUS (SEPSIS)
1000.0000 mL | Freq: Once | INTRAVENOUS | Status: AC
  Administered 2015-04-30: 1000 mL via INTRAVENOUS

## 2015-04-30 MED ORDER — HYDRALAZINE HCL 20 MG/ML IJ SOLN
5.0000 mg | INTRAMUSCULAR | Status: DC | PRN
  Administered 2015-04-30: 5 mg via INTRAVENOUS
  Filled 2015-04-30: qty 1

## 2015-04-30 MED ORDER — MUSCLE RUB 10-15 % EX CREA
1.0000 "application " | TOPICAL_CREAM | Freq: Every day | CUTANEOUS | Status: DC | PRN
  Administered 2015-05-03 – 2015-05-04 (×3): 1 via TOPICAL
  Filled 2015-04-30: qty 85

## 2015-04-30 MED ORDER — LABETALOL HCL 5 MG/ML IV SOLN
5.0000 mg | INTRAVENOUS | Status: DC | PRN
  Administered 2015-05-02 (×2): 5 mg via INTRAVENOUS
  Filled 2015-04-30 (×3): qty 4

## 2015-04-30 MED ORDER — POTASSIUM CHLORIDE 10 MEQ/100ML IV SOLN
10.0000 meq | INTRAVENOUS | Status: AC
  Administered 2015-04-30 (×5): 10 meq via INTRAVENOUS
  Filled 2015-04-30 (×4): qty 100

## 2015-04-30 MED ORDER — IOHEXOL 300 MG/ML  SOLN
50.0000 mL | Freq: Once | INTRAMUSCULAR | Status: AC | PRN
Start: 2015-04-30 — End: 2015-04-30
  Administered 2015-04-30: 50 mL via ORAL

## 2015-04-30 MED ORDER — ALPRAZOLAM 0.5 MG PO TABS
0.5000 mg | ORAL_TABLET | Freq: Two times a day (BID) | ORAL | Status: DC
  Administered 2015-04-30 – 2015-05-05 (×11): 0.5 mg via ORAL
  Filled 2015-04-30 (×11): qty 1

## 2015-04-30 MED ORDER — VITAMIN D3 25 MCG (1000 UNIT) PO TABS
1000.0000 [IU] | ORAL_TABLET | Freq: Every day | ORAL | Status: DC
  Administered 2015-04-30 – 2015-05-07 (×8): 1000 [IU] via ORAL
  Filled 2015-04-30 (×8): qty 1

## 2015-04-30 MED ORDER — HYDRALAZINE HCL 20 MG/ML IJ SOLN
10.0000 mg | INTRAMUSCULAR | Status: DC | PRN
  Administered 2015-04-30 – 2015-05-02 (×2): 10 mg via INTRAVENOUS
  Filled 2015-04-30 (×2): qty 1

## 2015-04-30 MED ORDER — PANTOPRAZOLE SODIUM 40 MG PO TBEC
40.0000 mg | DELAYED_RELEASE_TABLET | Freq: Every day | ORAL | Status: DC
  Administered 2015-04-30 – 2015-05-07 (×8): 40 mg via ORAL
  Filled 2015-04-30 (×11): qty 1

## 2015-04-30 MED ORDER — ONDANSETRON HCL 4 MG/2ML IJ SOLN
4.0000 mg | Freq: Once | INTRAMUSCULAR | Status: AC
  Administered 2015-04-30: 4 mg via INTRAVENOUS
  Filled 2015-04-30: qty 2

## 2015-04-30 MED ORDER — SODIUM CHLORIDE 0.9 % IJ SOLN
10.0000 mL | INTRAMUSCULAR | Status: DC | PRN
  Administered 2015-04-30 – 2015-05-06 (×5): 10 mL
  Administered 2015-05-06: 20 mL
  Administered 2015-05-07: 10 mL
  Filled 2015-04-30 (×7): qty 40

## 2015-04-30 MED ORDER — BOOST / RESOURCE BREEZE PO LIQD
1.0000 | Freq: Three times a day (TID) | ORAL | Status: DC
  Administered 2015-04-30 – 2015-05-07 (×15): 1 via ORAL

## 2015-04-30 MED ORDER — ENSURE ENLIVE PO LIQD
237.0000 mL | Freq: Two times a day (BID) | ORAL | Status: DC
  Administered 2015-04-30 – 2015-05-05 (×8): 237 mL via ORAL

## 2015-04-30 MED ORDER — HYDROMORPHONE HCL 1 MG/ML IJ SOLN
1.0000 mg | Freq: Once | INTRAMUSCULAR | Status: AC
  Administered 2015-04-30: 1 mg via INTRAVENOUS
  Filled 2015-04-30: qty 1

## 2015-04-30 MED ORDER — MAGNESIUM HYDROXIDE 400 MG/5ML PO SUSP
5.0000 mL | Freq: Every day | ORAL | Status: DC | PRN
  Administered 2015-05-01 – 2015-05-03 (×3): 5 mL via ORAL
  Filled 2015-04-30 (×3): qty 30

## 2015-04-30 MED ORDER — MORPHINE SULFATE ER 30 MG PO TBCR
30.0000 mg | EXTENDED_RELEASE_TABLET | Freq: Two times a day (BID) | ORAL | Status: DC
  Administered 2015-04-30 – 2015-05-02 (×5): 30 mg via ORAL
  Filled 2015-04-30 (×5): qty 1

## 2015-04-30 MED ORDER — SIMVASTATIN 20 MG PO TABS
20.0000 mg | ORAL_TABLET | Freq: Every day | ORAL | Status: DC
  Administered 2015-04-30 – 2015-05-06 (×7): 20 mg via ORAL
  Filled 2015-04-30 (×7): qty 1

## 2015-04-30 MED ORDER — FLUCONAZOLE 100 MG PO TABS
100.0000 mg | ORAL_TABLET | Freq: Every day | ORAL | Status: DC
  Administered 2015-04-30 – 2015-05-07 (×8): 100 mg via ORAL
  Filled 2015-04-30 (×8): qty 1

## 2015-04-30 MED ORDER — DICYCLOMINE HCL 20 MG PO TABS
20.0000 mg | ORAL_TABLET | Freq: Three times a day (TID) | ORAL | Status: DC
  Administered 2015-04-30 – 2015-05-07 (×22): 20 mg via ORAL
  Filled 2015-04-30 (×24): qty 1

## 2015-04-30 MED ORDER — IRBESARTAN 300 MG PO TABS
300.0000 mg | ORAL_TABLET | Freq: Every day | ORAL | Status: DC
  Administered 2015-04-30 – 2015-05-07 (×8): 300 mg via ORAL
  Filled 2015-04-30 (×8): qty 1

## 2015-04-30 MED ORDER — POTASSIUM CHLORIDE 10 MEQ/100ML IV SOLN
10.0000 meq | Freq: Once | INTRAVENOUS | Status: AC
  Administered 2015-04-30: 10 meq via INTRAVENOUS
  Filled 2015-04-30: qty 100

## 2015-04-30 MED ORDER — HYDROMORPHONE HCL 2 MG/ML IJ SOLN
2.0000 mg | INTRAMUSCULAR | Status: DC | PRN
  Administered 2015-04-30 – 2015-05-03 (×30): 2 mg via INTRAVENOUS
  Filled 2015-04-30 (×30): qty 1

## 2015-04-30 MED ORDER — SODIUM CHLORIDE 0.9 % IV SOLN
INTRAVENOUS | Status: DC
  Administered 2015-04-30 – 2015-05-05 (×12): via INTRAVENOUS

## 2015-04-30 MED ORDER — LORATADINE 10 MG PO TABS
10.0000 mg | ORAL_TABLET | Freq: Every morning | ORAL | Status: DC
  Administered 2015-04-30 – 2015-05-07 (×8): 10 mg via ORAL
  Filled 2015-04-30 (×8): qty 1

## 2015-04-30 NOTE — ED Notes (Signed)
Pt advised that she needs to drink the CT contrast; pt states "I know, I am"; pt has not drank the CT contrast despite being advised several times to do so.

## 2015-04-30 NOTE — Progress Notes (Signed)
CRITICAL VALUE ALERT  Critical value received:  Lactic Acid 3.3  Date of notification:  04/30/2015  Time of notification:  6962 am Critical value read back:Yes.    Nurse who received alert:  Faith Rogue, RN   MD notified (1st page):  Dr. Wyline Copas  Time of first page:  1051 am  MD notified (2nd page):  Time of second page:  Responding MD:  Dr. Wyline Copas   Time MD responded:  [phone]

## 2015-04-30 NOTE — Care Management Note (Signed)
Case Management Note  Patient Details  Name: [name] [name] MRN: [id number] Date of Birth: [birth year]  Subjective/Objective:  64 y/o f admitted w/abd pain.Hx:met lung ca.From  Home.                  Action/Plan:d/c plan home.   Expected Discharge Date:                  Expected Discharge Plan:  Home/Self Care  In-House Referral:     Discharge planning Services     Post Acute Care Choice:    Choice offered to:     DME Arranged:    DME Agency:     HH Arranged:    HH Agency:     Status of Service:  In process, will continue to follow  Medicare Important Message Given:    Date Medicare IM Given:    Medicare IM give by:    Date Additional Medicare IM Given:    Additional Medicare Important Message give by:     If discussed at [place] of Stay Meetings, dates discussed:    Additional Comments:  Dessa Phi, RN 04/30/2015, 4:25 PM

## 2015-04-30 NOTE — H&P (Signed)
[facility] History and Physical  [name] [name] NLG:[id number] DOB: 07-01-1951 DOA: 04/29/2015  Referring physician: ED physician PCP: Harvie Junior, MD  Specialists:   Chief Complaint: Severe abdominal pain  HPI: [name] [name] is a 64 y.o. female with PMH of diffusely metastasized right upper lobe lung cancer (s/p of lobectomy), hypertension, diabetes mellitus, GERD, anxiety, bipolar disorder, chronic back pain, history of C. difficile colitis, COPD, seizure, HCV and HBV, who presents with a severe abdominal pain.  Patient reports that in the past several days she has been having worsening severe abdominal pain. It constantly, 10 out of 10 in severity, nonradiating. It is associated with nausea or vomiting. She vomited once today. She does not have diarrhea, fever or chills. She was seen in ED approximately 24 hours ago for the same. She is taking her oxycodone and OxyContin at home without relief. She has mild shortness of breath and chronic cough due to smoking, which has not changed.  Currently patient denies fever, chills, running nose, ear pain, headaches, chest pain, diarrhea, constipation, dysuria, urgency, frequency, hematuria, skin rashes or leg swelling. No unilateral weakness, numbness or tingling sensations. No vision change or hearing loss.  In ED, patient was found to have elevated lactate 4.4, tachycardia, normal temperature, WBC 9.3, lipase 11, negative urinalysis, potassium 2.9. Chest x-ray showed metastasized disease, no pneumonia. CT abdomen/pelvis showed a diffusely metastasized disease, no other acute abnormalities.  Where does patient live?   At home    Can patient participate in ADLs?   Little    Review of Systems:   General: no fevers, chills, no changes in body weight, has poor appetite, has fatigue HEENT: no blurry vision, hearing changes or sore throat Pulm: has dyspnea, coughing, no wheezing CV: no chest pain, palpitations Abd: has nausea, vomiting,  abdominal pain, no diarrhea, constipation GU: no dysuria, burning on urination, increased urinary frequency, hematuria  Ext: no leg edema Neuro: no unilateral weakness, numbness, or tingling, no vision change or hearing loss Skin: no rash MSK: No muscle spasm, no deformity, no limitation of range of movement in spin Heme: No easy bruising.  Travel history: No recent long distant travel.  Allergy:  Allergies  Allergen Reactions  . Haldol [Haloperidol Decanoate] Other (See Comments)    tongue swelling  . Acetaminophen Other (See Comments)    Stomach pain  . Ibuprofen Other (See Comments)    Stomach pain  . Metronidazole Other (See Comments)    Severe stomach upset--was instructed to avoid Flagyl.  . Morphine And Related Hives    Tolerates hydromorphone.  Marland Kitchen Penicillins Rash    Past Medical History  Diagnosis Date  . Hypertension   . Bipolar 1 disorder   . Heart murmur   . Sciatica   . Arthritis   . Anxiety   . Diverticulosis   . Chronic low back pain   . DDD (degenerative disc disease), lumbar   . Chronic shoulder pain   . Chronic neck pain   . Hepatitis B     Unclear when initially diagnosed, labs in Epic from 03/09/13  . Hepatitis C     Unclear when initially diagnosed, labs in Epic from 03/09/13  . C. difficile diarrhea     April and February 2014  . COPD (chronic obstructive pulmonary disease)   . GERD (gastroesophageal reflux disease)   . H/O hiatal hernia   . Headache(784.0)     hx  . PONV (postoperative nausea and vomiting)   . Seizures  on meds  . Diabetes mellitus   . Adenocarcinoma of lung, stage 1   . GERD (gastroesophageal reflux disease)     Past Surgical History  Procedure Laterality Date  . Facial tumor removal Right 2000  . Brain tumor removal  09  . Brain surgery      in [place]  . Video bronchoscopy  12/04/2012    Procedure: VIDEO BRONCHOSCOPY;  Surgeon: Grace Isaac, MD;  Location: [facility] OR;  Service: Thoracic;  Laterality: N/A;   . Video assisted thoracoscopy (vats)/wedge resection  12/04/2012    Procedure: VIDEO ASSISTED THORACOSCOPY (VATS)/WEDGE RESECTION;  Surgeon: Grace Isaac, MD;  Location: [place];  Service: Thoracic;  Laterality: Right;  . Lobectomy  12/04/2012    Procedure: LOBECTOMY;  Surgeon: Grace Isaac, MD;  Location: [place];  Service: Thoracic;  Laterality: Right;  completion of right upper lobectomy and lymph node disection, placement of on q pump  . Tubal ligation    . Tonsillectomy    . Anterior cervical decomp/discectomy fusion N/A 03/05/2014    Procedure: ANTERIOR CERVICAL DECOMPRESSION/DISCECTOMY FUSION 2 LEVELS;  Surgeon: Sinclair Ship, MD;  Location: [place];  Service: Orthopedics;  Laterality: N/A;  Anterior cervical decompression fusion, cervical 4-5, cervical 5-6 with instrumentation, allograft.    Social History:  reports that she quit smoking about 21 months ago. Her smoking use included Cigarettes. She has a 2.5 pack-year smoking history. She has never used smokeless tobacco. She reports that she uses illicit drugs (Hydrocodone and Marijuana). She reports that she does not drink alcohol.  Family History:  Family History  Problem Relation Age of Onset  . Hypertension Father     deceased  . Diabetes Father   . Heart disease Father   . Hyperlipidemia Father   . Hypertension Mother   . Hyperlipidemia Mother   . Diabetes Mother   . Hypertension Sister   . Hyperlipidemia Sister   . Hypertension Brother   . Hyperlipidemia Brother      Prior to Admission medications   Medication Sig Start Date End Date Taking? Authorizing Provider  ALPRAZolam Duanne Moron) 1 MG tablet Take 0.5 mg by mouth 2 (two) times daily.     Historical Provider, MD  cholecalciferol (VITAMIN D) 1000 UNITS tablet Take 1,000 Units by mouth daily.    Historical Provider, MD  Cyanocobalamin (VITAMIN B-12 IJ) Inject as directed every 30 (thirty) days. Administered by Dr. York Ram around the 1st of each month     Historical Provider, MD  dexamethasone (DECADRON) 4 MG tablet Take 1 tablet (4 mg total) by mouth daily. 03/12/15   Oswald Hillock, MD  dicyclomine (BENTYL) 20 MG tablet Take 20 mg by mouth 3 (three) times daily with meals.    Historical Provider, MD  fluconazole (DIFLUCAN) 100 MG tablet Take 1 tablet (100 mg total) by mouth daily. 03/26/15   Tyler Pita, MD  folic acid (FOLVITE) 1 MG tablet Take 1 tablet (1 mg total) by mouth daily. 03/23/15   Truitt Merle, MD  gabapentin (NEURONTIN) 400 MG capsule Take 400 mg by mouth 3 (three) times daily. Nerve pain    Historical Provider, MD  Ipratropium-Albuterol (COMBIVENT RESPIMAT) 20-100 MCG/ACT AERS respimat Inhale 2 puffs into the lungs daily as needed for wheezing or shortness of breath.     Historical Provider, MD  KLOR-CON M20 20 MEQ tablet Take 20 mEq by mouth every morning. with food 01/04/15   Historical Provider, MD  loratadine (CLARITIN) 10 MG tablet  Take 10 mg by mouth every morning.     Historical Provider, MD  magnesium hydroxide (MILK OF MAGNESIA) 400 MG/5ML suspension Take 5 mLs by mouth daily as needed for mild constipation. 03/24/15   Costin Karlyne Greenspan, MD  megestrol (MEGACE) 40 MG/ML suspension Take 10 mLs (400 mg total) by mouth daily. Patient not taking: Reported on 04/15/2015 04/06/15   Hosie Poisson, MD  Menthol-Methyl Salicylate (MUSCLE RUB) 10-15 % CREA Apply 1 application topically daily as needed for muscle pain. Left shoulder pain    Historical Provider, MD  metFORMIN (GLUCOPHAGE) 500 MG tablet Take 1 tablet by mouth 2 (two) times daily. 03/30/15   Historical Provider, MD  metoCLOPramide (REGLAN) 5 MG tablet Take 1 tablet (5 mg total) by mouth 3 (three) times daily before meals. 03/12/15   Oswald Hillock, MD  metoprolol (LOPRESSOR) 50 MG tablet Take 1 tablet by mouth 2 (two) times daily. 04/08/15   Historical Provider, MD  metoprolol tartrate (LOPRESSOR) 25 MG tablet Take 1 tablet by mouth daily. 04/27/15   Historical Provider, MD  olmesartan  (BENICAR) 40 MG tablet Take 1 tablet (40 mg total) by mouth daily. Patient not taking: Reported on 04/28/2015 03/12/15   Oswald Hillock, MD  omeprazole (PRILOSEC) 20 MG capsule Take 1 capsule (20 mg total) by mouth 2 (two) times daily before a meal. Patient not taking: Reported on 04/28/2015 03/26/15   Tyler Pita, MD  ondansetron (ZOFRAN-ODT) 8 MG disintegrating tablet Take 1 tablet (8 mg total) by mouth every 6 (six) hours as needed for nausea or vomiting. 04/28/15   Truitt Merle, MD  OXcarbazepine (TRILEPTAL) 150 MG tablet Take 150 mg by mouth 3 (three) times daily.  11/24/13   Historical Provider, MD  OxyCODONE (OXYCONTIN) 20 mg T12A 12 hr tablet Take 1 tablet (20 mg total) by mouth every 12 (twelve) hours. 04/28/15   Truitt Merle, MD  OxyCODONE 30 MG T12A Take 30 mg by mouth every 12 (twelve) hours. Patient not taking: Reported on 04/14/2015 04/06/15   Hosie Poisson, MD  Oxycodone HCl 10 MG TABS Take 1-2 tablet (10 mg total) by mouth every 4 (four) hours as needed for moderate pain. Patient not taking: Reported on 04/28/2015 04/28/15   Truitt Merle, MD  senna-docusate (SENOKOT-S) 8.6-50 MG per tablet Take 2 tablets by mouth 2 (two) times daily. Patient not taking: Reported on 04/28/2015 03/12/15   Oswald Hillock, MD  simvastatin (ZOCOR) 20 MG tablet Take 20 mg by mouth at bedtime.     Historical Provider, MD  tiZANidine (ZANAFLEX) 4 MG tablet Take 4 mg by mouth every 8 (eight) hours as needed for muscle spasms.    Historical Provider, MD  Vitamin D, Ergocalciferol, (DRISDOL) 50000 UNITS CAPS capsule Take 50,000 Units by mouth every Monday.     Historical Provider, MD    Physical Exam: Filed Vitals:   04/30/15 0500 04/30/15 0530 04/30/15 0600 04/30/15 0644  BP: 205/100 218/105 194/93 203/102  Pulse: 105 85 118 122  Temp:    97.6 F (36.4 C)  TempSrc:    Axillary  Resp: '15 20 23   '$ SpO2: 100% 100% 100%    General: In acute distress due to pain HEENT:       Eyes: PERRL, EOMI, no scleral icterus.       ENT: No  discharge from the ears and nose, no pharynx injection, no tonsillar enlargement.        Neck: No JVD, no bruit, no mass felt. Heme: No  neck lymph node enlargement. Cardiac: S1/S2, RRR, tachycardia, No murmurs, No gallops or rubs. Pulm:  No rales, wheezing, rhonchi or rubs. Abd: Soft, nondistended, diffusely tender, no rebound pain, BS present. Ext: No pitting leg edema bilaterally. 2+DP/PT pulse bilaterally. Musculoskeletal: No joint deformities, No joint redness or warmth, no limitation of ROM in spin. Skin: No rashes.  Neuro: Alert, oriented X3, cranial nerves II-XII grossly intact, muscle strength 5/5 in all extremities, sensation to light touch intact.  Psych: [name] not psychotic, no suicidal or hemocidal ideation.  Labs on Admission:  Basic Metabolic Panel:  Recent Labs Lab 04/28/15 1305 04/28/15 2327 04/30/15 0045  NA 141 138 137  K 3.7 3.7 2.9*  CL  --  105 100*  CO2 25 24 21*  GLUCOSE 116 113* 203*  BUN 14.'5 13 16  '$ CREATININE 0.7 0.54 0.58  CALCIUM 8.6 8.7* 9.5   Liver Function Tests:  Recent Labs Lab 04/28/15 1305 04/28/15 2327 04/30/15 0045  AST '13 18 25  '$ ALT <6 10* 12*  ALKPHOS 457* 464* 558*  BILITOT 0.46 0.7 1.4*  PROT 6.0* 6.4* 7.6  ALBUMIN 3.0* 3.2* 3.8    Recent Labs Lab 04/30/15 0045  LIPASE 11*   No results for input(s): AMMONIA in the last 168 hours. CBC:  Recent Labs Lab 04/28/15 1305 04/28/15 2327 04/30/15 0045  WBC 6.7 7.9 9.3  NEUTROABS 5.2 6.2 7.5  HGB 8.8* 9.0* 11.0*  HCT 27.7* 28.4* 33.0*  MCV 90.2 89.0 87.1  PLT 74* 69* 73*   Cardiac Enzymes: No results for input(s): CKTOTAL, CKMB, CKMBINDEX, TROPONINI in the last 168 hours.  BNP (last 3 results) No results for input(s): BNP in the last 8760 hours.  ProBNP (last 3 results) No results for input(s): PROBNP in the last 8760 hours.  CBG: No results for input(s): GLUCAP in the last 168 hours.  Radiological Exams on Admission: Dg Chest 2 View  04/29/2015    CLINICAL DATA:  Lung cancer with osseous metastatic disease. Body aches.  EXAM: CHEST  2 VIEW  COMPARISON:  04/02/2015.  FINDINGS: The heart is enlarged. There is mild vascular congestion. Port-A-Cath tip good position SVC RA junction. Accentuation of RIGHT infrahilar markings is chronic.  The bones appear demineralized on the lateral radiograph, and are associated with rightward scoliosis. Metastatic disease is better visualized on PET scanning or cross-sectional imaging. Prior cervical fusion.  IMPRESSION: Widespread osseous metastatic disease better appreciated on PET scanning or cross-sectional imaging. No definite pathologic compression deformity. Cardiomegaly with mild vascular congestion. No acute chest findings are evident.   Electronically Signed   By: Rolla Flatten M.D.   On: 04/29/2015 02:28   Ct Abdomen Pelvis W Contrast  04/30/2015   CLINICAL DATA:  Generalized body aches and uncontrolled pain.  EXAM: CT ABDOMEN AND PELVIS WITH CONTRAST  TECHNIQUE: Multidetector CT imaging of the abdomen and pelvis was performed using the standard protocol following bolus administration of intravenous contrast.  CONTRAST:  165m OMNIPAQUE IOHEXOL 300 MG/ML  SOLN  COMPARISON:  02/17/2015  FINDINGS: There are diffuse lytic and sclerotic skeletal metastases with progression since 02/17/2015. No pathologic fracture is evident.  No hepatic or adrenal metastases are evident. There are unremarkable appearances of the liver, gallbladder and bile ducts, except for mildly generous caliber the extrahepatic biliary system which is unchanged.  The pancreas, spleen, adrenals and kidneys appear unremarkable.  There is a small hiatal hernia. Bowel is otherwise unremarkable. There is no bowel obstruction. There is no focal inflammatory change in  the abdomen or pelvis. There is no ascites.  Uterus and adnexal structures appear unremarkable.  .  There is no significant abnormality in the lower chest.  IMPRESSION: 1. Diffuse skeletal  metastases with disease progression since 02/17/2015. 2. No acute findings are evident in the abdomen or pelvis. No hepatic or adrenal metastases. 3. Small hiatal hernia.   Electronically Signed   By: Andreas Newport M.D.   On: 04/30/2015 04:47    EKG: Independently reviewed.  Abnormal findings:  LAE, tachycardia.   Assessment/Plan Principal Problem:   Abdominal pain Active Problems:   Lung cancer, right upper lobe   Anxiety disorder   Bipolar 1 disorder   Hepatitis C   Hepatitis B   Hypertension   Diabetes mellitus   Spinal stenosis of lumbar region L L%-S1   Metastatic primary lung cancer   Osseous metastasis   Metastatic cancer to bone   Depression   Palliative care encounter   Generalized anxiety disorder   Protein-calorie malnutrition, severe   AP (abdominal pain)   SIRS (systemic inflammatory response syndrome)   Hypokalemia   GERD (gastroesophageal reflux disease)  Abdominal pain: Patient has severe abdominal pain. It is most likely due to metastasized lung cancer. Abdomen/pelvis showed diffuse metastasized disease, but no other acute abnormalities. Lipase negative. Urinalysis is negative. -will admit tele bed -dilaudid 2 mg q2h,  -Increase MS Contin dose from 20 to 30 mg twice a day -Zofran for nausea  SIRS (systemic inflammatory response syndrome): Patient meets criteria for SIRS, with elevated lactate and tachycardia, no source of infection identified. Likely due to metastatic cancer and severe pain. -will get Procalcitonin and trend lactic acid level per sepsis protocol -follow up blood culture -IVF: received 3L of NS bolus in ED, followed by 100 cc/h  Metastasized lung cancer, right upper lobe: She had stage 1 right lung cancer status post lobectomy in 2014, now with diffuse metastasis, including bone inta-abdomen and lung. Patient was seen by Dr. Krista Blue on 04/28/15. Plant to start chemotherapy on 05/05/15. -pain control -follow up with dr. Krista Blue -Continue Decadron 4  mg daily  Anxiety: Seems to be stable. No suicidal or homicidal ideations.  -Continue Xanax.  HTN: Blood pressure is elevated at 205/100. Likely due to severe pain. -Hydralazine IV when necessary -Pain control as above -Metoprolol  Seizure:  continue oxcabazepine  DM-II: Last A1c 6.1 on 03/08/15, well controled. [name] taking metformin at home -SSI  Protein-calorie malnutrition, severe: -Ensure  COPD: Stable. No signs of acute exacerbation. Lung auscultations clear bilaterally. -DuoNeb when necessary  GERD: -Protonix  Hypokalemia: K= 2.9 on admission. - Repleted - Check Mg level   DVT ppx: SCD  Code Status: Full code Family Communication: None at bed side.   Disposition Plan: Admit to inpatient   Date of Service 04/30/2015    Ivor Costa [facility] Pager [phone]  If 7PM-7AM, please contact night-coverage [email or web address] Password [email or web address] 04/30/2015, 7:06 AM

## 2015-04-30 NOTE — Progress Notes (Signed)
PT Cancellation Note  Patient Details Name: Wretha Laris MRN: [id number] DOB: October 24, 1951   Cancelled Treatment:    Reason Eval/Treat Not Completed: Medical issues which prohibited therapy (HR 121 at rest and RN reports high lactic acid, will check back as schedule permits.)   Raneshia Derick,[name] 04/30/2015, 10:05 AM [name], PT, DPT 04/30/2015 Pager: [phone]

## 2015-04-30 NOTE — Progress Notes (Signed)
[name] [name]   DOB:10/01/1951   VQ#:[id number]   IEP#:[id number]  Subjective: Pt is well known to me, I saw her a few days ago in my clinic. She was admitted to [facility] last night for abdominal pain and nausea. She had multiple similar hospital admission in the past few months since her metastatic cancer diagnosis. She states her pain is slightly better today.    Objective:  Filed Vitals:   04/30/15 1415  BP: 137/73  Pulse: 114  Temp: 98.7 F (37.1 C)  Resp: 20    Body mass index is 21.15 kg/(m^2).  Intake/Output Summary (Last 24 hours) at 04/30/15 1835 Last data filed at 04/30/15 1700  Gross per 24 hour  Intake    860 ml  Output    600 ml  Net    260 ml     Sclerae unicteric  Oropharynx clear  No peripheral adenopathy  Lungs clear -- no rales or rhonchi  Heart regular rate and rhythm  Abdomen benign  MSK no focal spinal tenderness, no peripheral edema  Neuro nonfocal    CBG (last 3)   Recent Labs  04/30/15 0725 04/30/15 1155 04/30/15 1633  GLUCAP 198* 117* 163*     Labs:  Lab Results  Component Value Date   WBC 9.3 04/30/2015   HGB 11.0* 04/30/2015   HCT 33.0* 04/30/2015   MCV 87.1 04/30/2015   PLT 73* 04/30/2015   NEUTROABS 7.5 04/30/2015    '@LASTCHEMISTRY'$ @  Urine Studies No results for input(s): UHGB, CRYS in the last 72 hours.  Invalid input(s): UACOL, UAPR, USPG, UPH, UTP, UGL, UKET, UBIL, UNIT, UROB, ULEU, UEPI, UWBC, URBC, UBAC, CAST, [place], [place]  Basic Metabolic Panel:  Recent Labs Lab 04/28/15 1305 04/28/15 2327 04/30/15 0045 04/30/15 0915  NA 141 138 137  --   K 3.7 3.7 2.9*  --   CL  --  105 100*  --   CO2 25 24 21*  --   GLUCOSE 116 113* 203*  --   BUN 14.'5 13 16  '$ --   CREATININE 0.7 0.54 0.58  --   CALCIUM 8.6 8.7* 9.5  --   MG  --   --   --  1.7   GFR Estimated Creatinine Clearance: 59.7 mL/min (by C-G formula based on Cr of 0.58). Liver Function Tests:  Recent Labs Lab 04/28/15 1305 04/28/15 2327 04/30/15 0045  AST '13  18 25  '$ ALT <6 10* 12*  ALKPHOS 457* 464* 558*  BILITOT 0.46 0.7 1.4*  PROT 6.0* 6.4* 7.6  ALBUMIN 3.0* 3.2* 3.8    Recent Labs Lab 04/30/15 0045  LIPASE 11*   No results for input(s): AMMONIA in the last 168 hours. Coagulation profile  Recent Labs Lab 04/30/15 0915  INR 1.54*    CBC:  Recent Labs Lab 04/28/15 1305 04/28/15 2327 04/30/15 0045  WBC 6.7 7.9 9.3  NEUTROABS 5.2 6.2 7.5  HGB 8.8* 9.0* 11.0*  HCT 27.7* 28.4* 33.0*  MCV 90.2 89.0 87.1  PLT 74* 69* 73*   Cardiac Enzymes: No results for input(s): CKTOTAL, CKMB, CKMBINDEX, TROPONINI in the last 168 hours. BNP: Invalid input(s): POCBNP CBG:  Recent Labs Lab 04/30/15 0725 04/30/15 1155 04/30/15 1633  GLUCAP 198* 117* 163*   D-Dimer No results for input(s): DDIMER in the last 72 hours. Hgb A1c No results for input(s): HGBA1C in the last 72 hours. Lipid Profile No results for input(s): CHOL, HDL, LDLCALC, TRIG, CHOLHDL, LDLDIRECT in the last 72 hours. Thyroid function studies  No results for input(s): TSH, T4TOTAL, T3FREE, THYROIDAB in the last 72 hours.  Invalid input(s): FREET3 Anemia work up No results for input(s): VITAMINB12, FOLATE, FERRITIN, TIBC, IRON, RETICCTPCT in the last 72 hours. Microbiology Recent Results (from the past 240 hour(s))  TECHNOLOGIST REVIEW     Status: None   Collection Time: 04/28/15  1:05 PM  Result Value Ref Range Status   Technologist Review Metas and Myelocytes present. 4% nrbcs  Final      Studies:  Dg Chest 2 View  04/29/2015   CLINICAL DATA:  Lung cancer with osseous metastatic disease. Body aches.  EXAM: CHEST  2 VIEW  COMPARISON:  04/02/2015.  FINDINGS: The heart is enlarged. There is mild vascular congestion. Port-A-Cath tip good position SVC RA junction. Accentuation of RIGHT infrahilar markings is chronic.  The bones appear demineralized on the lateral radiograph, and are associated with rightward scoliosis. Metastatic disease is better visualized on PET  scanning or cross-sectional imaging. Prior cervical fusion.  IMPRESSION: Widespread osseous metastatic disease better appreciated on PET scanning or cross-sectional imaging. No definite pathologic compression deformity. Cardiomegaly with mild vascular congestion. No acute chest findings are evident.   Electronically Signed   By: Rolla Flatten M.D.   On: 04/29/2015 02:28   Ct Abdomen Pelvis W Contrast  04/30/2015   CLINICAL DATA:  Generalized body aches and uncontrolled pain.  EXAM: CT ABDOMEN AND PELVIS WITH CONTRAST  TECHNIQUE: Multidetector CT imaging of the abdomen and pelvis was performed using the standard protocol following bolus administration of intravenous contrast.  CONTRAST:  163m OMNIPAQUE IOHEXOL 300 MG/ML  SOLN  COMPARISON:  02/17/2015  FINDINGS: There are diffuse lytic and sclerotic skeletal metastases with progression since 02/17/2015. No pathologic fracture is evident.  No hepatic or adrenal metastases are evident. There are unremarkable appearances of the liver, gallbladder and bile ducts, except for mildly generous caliber the extrahepatic biliary system which is unchanged.  The pancreas, spleen, adrenals and kidneys appear unremarkable.  There is a small hiatal hernia. Bowel is otherwise unremarkable. There is no bowel obstruction. There is no focal inflammatory change in the abdomen or pelvis. There is no ascites.  Uterus and adnexal structures appear unremarkable.  .  There is no significant abnormality in the lower chest.  IMPRESSION: 1. Diffuse skeletal metastases with disease progression since 02/17/2015. 2. No acute findings are evident in the abdomen or pelvis. No hepatic or adrenal metastases. 3. Small hiatal hernia.   Electronically Signed   By: DAndreas NewportM.D.   On: 04/30/2015 04:47    Assessment: 64y.o. 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   1. 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 Intermittent abdominal pain and nausea 3. Bipolar 4.  Anxiety/ddepression 5. DM 6. HTN 7. Hep C    Plan: Her CT abdomen revealed progression of her bone mets, no abd/pelvic mets or other acute change. I think her abdominal pain are related to her pelvic bone mets, which was treated with palliative RT recently, and possible psychological components  She does have severe T12 bone mets. She does not have mid back pain or LE weakness. No high clinical suspicion for spinal cord compression. But need to watch her urinary retension and other neuro symptoms.  Given her limited social support and compliance issue, I think she is a poor candidate for chemotherapy.  I think she would benefit from palliative care consult  I will follow her as needed. She has a f/p appointment with me next week.  Truitt Merle, MD 04/30/2015  6:35 PM

## 2015-04-30 NOTE — ED Notes (Signed)
Pt climbed out of bed, pulled her pants down and urinated in the floor; pt had no explanation as to why she did not use the call light or ask the CT tech who had just been in her room; pt states "I'm sorry ok"; when attempting to advise pt why it is important to use her call bell and call to prevent falls due to the amount of pain medication pt has received and the monitoring equipment that she is attached to.

## 2015-04-30 NOTE — ED Notes (Signed)
Pt requesting more pain medication and Zofran; RN advised that it is not time for pain medication; no Emesis noted since pt's arrival to the ER; pt spits saliva from mouth and makes vomiting noises; pt advised not to get OOB without using call light; pt verbalizes understanding

## 2015-04-30 NOTE — ED Notes (Signed)
Pt on the call light numerous times requesting pain medications; Dr Dina Rich made aware

## 2015-04-30 NOTE — ED Notes (Signed)
Pt hitting call bell numerous times, requesting pain medications; pt advised that RN was on her way; Upon entering room pt had removed monitoring equipment after this RN had asked pt to please leave on.

## 2015-04-30 NOTE — ED Notes (Signed)
Pt aware that a urine sample is needed. But does not have to urinate at this time.

## 2015-04-30 NOTE — ED Notes (Signed)
Upon entering the room pt had removed monitoring equipment again; pt advised that due to receiving pain medications she needed to keep the monitoring equipment on so she could be assessed; RN advised patient that this RN would not feel comfortable giving additional pain medications unless the monitoring equipment remains on pt; pt verbalized understanding

## 2015-04-30 NOTE — Progress Notes (Addendum)
[facility] PROGRESS NOTE  [name] [name] XNT:[id number] DOB: Apr 12, 1951 DOA: 04/29/2015 PCP: Harvie Junior, MD  Assessment/Plan: Abdominal pain:  - Suspect chronic severe abdominal pain that is likely due to metastasized lung cancer with known bone mets. Abdomen/pelvis showed diffuse metastasized disease that appears worse from prior studies. Lipase negative. Urinalysis is negative. -Continued on dilaudid 2 mg q2h,  -Increased MS Contin dose from 20 to 30 mg twice a day -Continue Zofran for nausea -Pain seems improved today. -Advance diet as tolerated  Tachycardia that is unlikely SIRS - Patient met criteria for SIRS, with elevated lactate and tachycardia, no source of infection identified, however, procalcitonin is <0.10 suggesting non-infectious pathology - Likely due to metastatic cancer and severe pain - blood culture neg x 2 - IVF: received 3L of NS bolus in ED, followed by 100 cc/h  Metastasized lung cancer, right upper lobe:  - Pt with stage 1 right lung cancer status post lobectomy in 2014, now with diffuse metastasis, including bone inta-abdomen and lung. - Pt followed by Dr. Burr Medico, last seen on 04/28/15 with plans to start chemotherapy on 05/05/15. -Appreciate input by Dr. Burr Medico -Continue Decadron 4 mg daily  Anxiety:  - Seems to be stable. No suicidal or homicidal ideations.  -Continue Xanax.  HTN:  - Blood pressure poorly controlled, likely result of acute pain -Hydralazine IV when necessary -Pain control as above -Metoprolol scheduled - Also on PRN labetalol IV for HR>115 or uncontrolled BP  Seizure:  - continued oxcabazepine - Stable Currently  DM-II:  - Last A1c 6.1 on 03/08/15, well controled.  - Patient on metformin at home - Cont on SSI  Protein-calorie malnutrition, severe: - Ensure - Nutrition following  COPD:  - Stable. No signs of acute exacerbation. -DuoNeb PRN  GERD: - Protonix  Hypokalemia:  - K= 2.9 on admission. -  Replaced  Hypomagnesemia - Replaced  DVT ppx:  - SCD  Code Status: Full Family Communication: Pt in room Disposition Plan: Possible d/c home when tolerates PO   Consultants:  Oncology  Procedures:    Antibiotics:    HPI/Subjective: Still complains of mild abd pain, albeit improved  Objective: Filed Vitals:   04/30/15 0742 04/30/15 0900 04/30/15 0927 04/30/15 1415  BP: 200/103  190/103 137/73  Pulse:   138 114  Temp:    98.7 F (37.1 C)  TempSrc:    Oral  Resp:    20  Height:  5' 4"  (1.626 m)    Weight:  55.929 kg (123 lb 4.8 oz)    SpO2:    100%    Intake/Output Summary (Last 24 hours) at 04/30/15 1652 Last data filed at 04/30/15 1500  Gross per 24 hour  Intake    800 ml  Output    600 ml  Net    200 ml   Filed Weights   04/30/15 0900  Weight: 55.929 kg (123 lb 4.8 oz)    Exam:   General:  Awake, laying in bed, in nad  Cardiovascular: regular, s1, s2  Respiratory: normal resp effort, no wheezing  Abdomen: soft,nondistended, generally tender  Musculoskeletal: perfused, no clubbing   Data Reviewed: Basic Metabolic Panel:  Recent Labs Lab 04/28/15 1305 04/28/15 2327 04/30/15 0045 04/30/15 0915  NA 141 138 137  --   K 3.7 3.7 2.9*  --   CL  --  105 100*  --   CO2 25 24 21*  --   GLUCOSE 116 113* 203*  --   BUN 14.5 13  16  --   CREATININE 0.7 0.54 0.58  --   CALCIUM 8.6 8.7* 9.5  --   MG  --   --   --  1.7   Liver Function Tests:  Recent Labs Lab 04/28/15 1305 04/28/15 2327 04/30/15 0045  AST 13 18 25   ALT <6 10* 12*  ALKPHOS 457* 464* 558*  BILITOT 0.46 0.7 1.4*  PROT 6.0* 6.4* 7.6  ALBUMIN 3.0* 3.2* 3.8    Recent Labs Lab 04/30/15 0045  LIPASE 11*   No results for input(s): AMMONIA in the last 168 hours. CBC:  Recent Labs Lab 04/28/15 1305 04/28/15 2327 04/30/15 0045  WBC 6.7 7.9 9.3  NEUTROABS 5.2 6.2 7.5  HGB 8.8* 9.0* 11.0*  HCT 27.7* 28.4* 33.0*  MCV 90.2 89.0 87.1  PLT 74* 69* 73*   Cardiac  Enzymes: No results for input(s): CKTOTAL, CKMB, CKMBINDEX, TROPONINI in the last 168 hours. BNP (last 3 results) No results for input(s): BNP in the last 8760 hours.  ProBNP (last 3 results) No results for input(s): PROBNP in the last 8760 hours.  CBG:  Recent Labs Lab 04/30/15 0725 04/30/15 1155 04/30/15 1633  GLUCAP 198* 117* 163*    Recent Results (from the past 240 hour(s))  TECHNOLOGIST REVIEW     Status: None   Collection Time: 04/28/15  1:05 PM  Result Value Ref Range Status   Technologist Review Metas and Myelocytes present. 4% nrbcs  Final     Studies: Dg Chest 2 View  04/29/2015   CLINICAL DATA:  Lung cancer with osseous metastatic disease. Body aches.  EXAM: CHEST  2 VIEW  COMPARISON:  04/02/2015.  FINDINGS: The heart is enlarged. There is mild vascular congestion. Port-A-Cath tip good position SVC RA junction. Accentuation of RIGHT infrahilar markings is chronic.  The bones appear demineralized on the lateral radiograph, and are associated with rightward scoliosis. Metastatic disease is better visualized on PET scanning or cross-sectional imaging. Prior cervical fusion.  IMPRESSION: Widespread osseous metastatic disease better appreciated on PET scanning or cross-sectional imaging. No definite pathologic compression deformity. Cardiomegaly with mild vascular congestion. No acute chest findings are evident.   Electronically Signed   By: Rolla Flatten M.D.   On: 04/29/2015 02:28   Ct Abdomen Pelvis W Contrast  04/30/2015   CLINICAL DATA:  Generalized body aches and uncontrolled pain.  EXAM: CT ABDOMEN AND PELVIS WITH CONTRAST  TECHNIQUE: Multidetector CT imaging of the abdomen and pelvis was performed using the standard protocol following bolus administration of intravenous contrast.  CONTRAST:  185m OMNIPAQUE IOHEXOL 300 MG/ML  SOLN  COMPARISON:  02/17/2015  FINDINGS: There are diffuse lytic and sclerotic skeletal metastases with progression since 02/17/2015. No pathologic  fracture is evident.  No hepatic or adrenal metastases are evident. There are unremarkable appearances of the liver, gallbladder and bile ducts, except for mildly generous caliber the extrahepatic biliary system which is unchanged.  The pancreas, spleen, adrenals and kidneys appear unremarkable.  There is a small hiatal hernia. Bowel is otherwise unremarkable. There is no bowel obstruction. There is no focal inflammatory change in the abdomen or pelvis. There is no ascites.  Uterus and adnexal structures appear unremarkable.  .  There is no significant abnormality in the lower chest.  IMPRESSION: 1. Diffuse skeletal metastases with disease progression since 02/17/2015. 2. No acute findings are evident in the abdomen or pelvis. No hepatic or adrenal metastases. 3. Small hiatal hernia.   Electronically Signed   By: DValerie RoysD.  On: 04/30/2015 04:47    Scheduled Meds: . ALPRAZolam  0.5 mg Oral BID  . cholecalciferol  1,000 Units Oral Daily  . dexamethasone  4 mg Oral Daily  . dicyclomine  20 mg Oral TID WC  . feeding supplement (ENSURE ENLIVE)  237 mL Oral BID BM  . feeding supplement (RESOURCE BREEZE)  1 Container Oral TID BM  . fluconazole  100 mg Oral Daily  . folic acid  1 mg Oral Daily  . gabapentin  400 mg Oral TID  . insulin aspart  0-9 Units Subcutaneous TID WC  . irbesartan  300 mg Oral Daily  . loratadine  10 mg Oral q morning - 10a  . metoCLOPramide  5 mg Oral TID AC  . metoprolol tartrate  25 mg Oral Daily  . morphine  30 mg Oral Q12H  . nicotine  21 mg Transdermal Daily  . OXcarbazepine  150 mg Oral TID  . pantoprazole  40 mg Oral Daily  . simvastatin  20 mg Oral QHS  . sodium chloride  3 mL Intravenous Q12H   Continuous Infusions: . sodium chloride 100 mL/hr at 04/30/15 7654    Principal Problem:   Abdominal pain Active Problems:   Lung cancer, right upper lobe   Anxiety disorder   Bipolar 1 disorder   Hepatitis C   Hepatitis B   Hypertension   Diabetes  mellitus   Spinal stenosis of lumbar region L L%-S1   Metastatic primary lung cancer   Osseous metastasis   Metastatic cancer to bone   Depression   Palliative care encounter   Generalized anxiety disorder   Protein-calorie malnutrition, severe   AP (abdominal pain)   SIRS (systemic inflammatory response syndrome)   Hypokalemia   GERD (gastroesophageal reflux disease)   Malnutrition of moderate degree   [name] [name], [name] Pager [phone]. If 7PM-7AM, please contact night-coverage at [email or web address], password [facility] 04/30/2015, 4:52 PM  LOS: 0 days

## 2015-04-30 NOTE — Progress Notes (Signed)
OT Cancellation Note  Patient Details Name: [name] [name] MRN: [id number] DOB: [birth year]   Cancelled Treatment:    Reason Eval/Treat Not Completed: Patient not medically ready. RN states that patient's HR elevated, BP elevated, and lab work is unstable. RN suggested hold therapy until tomorrow. Will check back as appropriate for OT eval/treat.    Ziad Maye , MS, OTR/L, CLT Pager: [phone]  04/30/2015, 1:39 PM

## 2015-04-30 NOTE — ED Notes (Signed)
Pt advised that she needs a urine sample; pt refusing to be cathed at this times; pt states "just give me a few minutes and I will pee"; pt asked again about drinking CT contrast and pt states "I am"; no CT contrast gone from the cups that were given to the pt

## 2015-04-30 NOTE — ED Provider Notes (Signed)
CSN: [id number]     Arrival date & time 04/29/15  2343 History   First MD Initiated Contact with Patient 04/29/15 2358     Chief Complaint  Patient presents with  . Pain     (Consider location/radiation/quality/duration/timing/severity/associated sxs/prior Treatment) HPI  This is a 64 year old female with a history of metastatic bone cancer, hypertension, bipolar disorder, chronic pain who presents with pain. She was seen approximately 24 hours ago for the same. She reports diffuse abdominal pain with vomiting. She is taking her oxycodone and OxyContin at home without relief. She reports "I'm sick." She also states "I can't talk to you right now." When asked to provide further history said that I could help her, patient reports that she's had nonbilious nonbloody emesis. No diarrhea. No known fevers. She reports that she feels sweaty.  Past Medical History  Diagnosis Date  . Hypertension   . Bipolar 1 disorder   . Heart murmur   . Sciatica   . Arthritis   . Anxiety   . Diverticulosis   . Chronic low back pain   . DDD (degenerative disc disease), lumbar   . Chronic shoulder pain   . Chronic neck pain   . Hepatitis B     Unclear when initially diagnosed, labs in Epic from 03/09/13  . Hepatitis C     Unclear when initially diagnosed, labs in Epic from 03/09/13  . C. difficile diarrhea     April and February 2014  . COPD (chronic obstructive pulmonary disease)   . GERD (gastroesophageal reflux disease)   . H/O hiatal hernia   . Headache(784.0)     hx  . PONV (postoperative nausea and vomiting)   . Seizures     on meds  . Diabetes mellitus   . Adenocarcinoma of lung, stage 1    Past Surgical History  Procedure Laterality Date  . Facial tumor removal Right 2000  . Brain tumor removal  09  . Brain surgery      in [place]  . Video bronchoscopy  12/04/2012    Procedure: VIDEO BRONCHOSCOPY;  Surgeon: Grace Isaac, MD;  Location: [facility] OR;  Service: Thoracic;  Laterality:  N/A;  . Video assisted thoracoscopy (vats)/wedge resection  12/04/2012    Procedure: VIDEO ASSISTED THORACOSCOPY (VATS)/WEDGE RESECTION;  Surgeon: Grace Isaac, MD;  Location: [place];  Service: Thoracic;  Laterality: Right;  . Lobectomy  12/04/2012    Procedure: LOBECTOMY;  Surgeon: Grace Isaac, MD;  Location: [place];  Service: Thoracic;  Laterality: Right;  completion of right upper lobectomy and lymph node disection, placement of on q pump  . Tubal ligation    . Tonsillectomy    . Anterior cervical decomp/discectomy fusion N/A 03/05/2014    Procedure: ANTERIOR CERVICAL DECOMPRESSION/DISCECTOMY FUSION 2 LEVELS;  Surgeon: Sinclair Ship, MD;  Location: [place];  Service: Orthopedics;  Laterality: N/A;  Anterior cervical decompression fusion, cervical 4-5, cervical 5-6 with instrumentation, allograft.   Family History  Problem Relation Age of Onset  . Hypertension Father     deceased  . Diabetes Father   . Heart disease Father   . Hyperlipidemia Father   . Hypertension Mother   . Hyperlipidemia Mother   . Diabetes Mother   . Hypertension Sister   . Hyperlipidemia Sister   . Hypertension Brother   . Hyperlipidemia Brother    History  Substance Use Topics  . Smoking status: Former Smoker -- 0.25 packs/day for 10 years  Types: Cigarettes    Quit date: 07/07/2013  . Smokeless tobacco: Never Used     Comment: Smokes pk q 3 days   marajuna aug  . Alcohol Use: No     Comment: quit 26yr ago   OB History    No data available     Review of Systems  Constitutional: Positive for diaphoresis. Negative for fever.  Respiratory: Negative for cough, chest tightness and shortness of breath.   Cardiovascular: Negative for chest pain.  Gastrointestinal: Positive for nausea, vomiting and abdominal pain. Negative for diarrhea.  Genitourinary: Negative for dysuria.  Musculoskeletal: Negative for back pain.  Neurological: Negative for headaches.  Psychiatric/Behavioral: Negative  for confusion.  All other systems reviewed and are negative.     Allergies  Haldol; Acetaminophen; Ibuprofen; Metronidazole; Morphine and related; and Penicillins  Home Medications   Prior to Admission medications   Medication Sig Start Date End Date Taking? Authorizing Provider  ALPRAZolam (Duanne Moron 1 MG tablet Take 0.5 mg by mouth 2 (two) times daily.    Yes Historical Provider, MD  cholecalciferol (VITAMIN D) 1000 UNITS tablet Take 1,000 Units by mouth daily.   Yes Historical Provider, MD  Cyanocobalamin (VITAMIN B-12 IJ) Inject as directed every 30 (thirty) days. Administered by Dr. DYork Ramaround the 1st of each month   Yes Historical Provider, MD  dexamethasone (DECADRON) 4 MG tablet Take 1 tablet (4 mg total) by mouth daily. 03/12/15  Yes GOswald Hillock MD  fluconazole (DIFLUCAN) 100 MG tablet Take 1 tablet (100 mg total) by mouth daily. 03/26/15  Yes MTyler Pita MD  folic acid (FOLVITE) 1 MG tablet Take 1 tablet (1 mg total) by mouth daily. 03/23/15  Yes YTruitt Merle MD  gabapentin (NEURONTIN) 400 MG capsule Take 400 mg by mouth 3 (three) times daily. Nerve pain   Yes Historical Provider, MD  Ipratropium-Albuterol (COMBIVENT RESPIMAT) 20-100 MCG/ACT AERS respimat Inhale 2 puffs into the lungs daily as needed for wheezing or shortness of breath.    Yes Historical Provider, MD  KLOR-CON M20 20 MEQ tablet Take 20 mEq by mouth every morning. with food 01/04/15  Yes Historical Provider, MD  loratadine (CLARITIN) 10 MG tablet Take 10 mg by mouth every morning.    Yes Historical Provider, MD  magnesium hydroxide (MILK OF MAGNESIA) 400 MG/5ML suspension Take 5 mLs by mouth daily as needed for mild constipation. 03/24/15  Yes Costin MKarlyne Greenspan MD  Menthol-Methyl Salicylate (MUSCLE RUB) 10-15 % CREA Apply 1 application topically daily as needed for muscle pain. Left shoulder pain   Yes Historical Provider, MD  metFORMIN (GLUCOPHAGE) 500 MG tablet Take 1 tablet by mouth 2 (two) times daily.  03/30/15  Yes Historical Provider, MD  metoCLOPramide (REGLAN) 5 MG tablet Take 1 tablet (5 mg total) by mouth 3 (three) times daily before meals. 03/12/15  Yes GOswald Hillock MD  metoprolol tartrate (LOPRESSOR) 25 MG tablet Take 1 tablet by mouth daily. 04/27/15  Yes Historical Provider, MD  omeprazole (PRILOSEC) 20 MG capsule Take 1 capsule (20 mg total) by mouth 2 (two) times daily before a meal. 03/26/15  Yes MTyler Pita MD  ondansetron (ZOFRAN-ODT) 8 MG disintegrating tablet Take 1 tablet (8 mg total) by mouth every 6 (six) hours as needed for nausea or vomiting. 04/28/15  Yes YTruitt Merle MD  OXcarbazepine (TRILEPTAL) 150 MG tablet Take 150 mg by mouth 3 (three) times daily.  11/24/13  Yes Historical Provider, MD  OxyCODONE (OXYCONTIN) 20 mg T12A 12 hr  tablet Take 1 tablet (20 mg total) by mouth every 12 (twelve) hours. 04/28/15  Yes Truitt Merle, MD  Vitamin D, Ergocalciferol, (DRISDOL) 50000 UNITS CAPS capsule Take 50,000 Units by mouth every Monday.    Yes Historical Provider, MD  dicyclomine (BENTYL) 20 MG tablet Take 20 mg by mouth 3 (three) times daily with meals.    Historical Provider, MD  megestrol (MEGACE) 40 MG/ML suspension Take 10 mLs (400 mg total) by mouth daily. Patient not taking: Reported on 04/15/2015 04/06/15   Hosie Poisson, MD  olmesartan (BENICAR) 40 MG tablet Take 1 tablet (40 mg total) by mouth daily. Patient not taking: Reported on 04/28/2015 03/12/15   Oswald Hillock, MD  OxyCODONE 30 MG T12A Take 30 mg by mouth every 12 (twelve) hours. Patient not taking: Reported on 04/14/2015 04/06/15   Hosie Poisson, MD  Oxycodone HCl 10 MG TABS Take 1-2 tablet (10 mg total) by mouth every 4 (four) hours as needed for moderate pain. Patient not taking: Reported on 04/28/2015 04/28/15   Truitt Merle, MD  senna-docusate (SENOKOT-S) 8.6-50 MG per tablet Take 2 tablets by mouth 2 (two) times daily. Patient not taking: Reported on 04/28/2015 03/12/15   Oswald Hillock, MD  simvastatin (ZOCOR) 20 MG tablet Take 20 mg by  mouth at bedtime.     Historical Provider, MD   BP 218/105 mmHg  Pulse 85  Temp(Src) 98.1 F (36.7 C) (Oral)  Resp 20  SpO2 100% Physical Exam  Constitutional: She is oriented to person, place, and time.  Uncomfortable appearing, restless, diaphoretic  HENT:  Head: Normocephalic and atraumatic.  Eyes: EOM are normal. Pupils are equal, round, and reactive to light.  Cardiovascular: Regular rhythm and normal heart sounds.   Tachycardia  Pulmonary/Chest: Effort normal and breath sounds normal. No respiratory distress. She has no wheezes.  Port right upper chest  Abdominal: Soft. Bowel sounds are normal. There is tenderness. There is no rebound and no guarding.  Neurological: She is alert and oriented to person, place, and time.  Skin: Skin is warm.  Diaphoretic  Psychiatric:  Anxious, restless, and disheveled  Nursing note and vitals reviewed.   ED Course  Procedures (including critical care time) Labs Review Labs Reviewed  CBC WITH DIFFERENTIAL/PLATELET - Abnormal; Notable for the following:    RBC 3.79 (*)    Hemoglobin 11.0 (*)    HCT 33.0 (*)    RDW 15.9 (*)    Platelets 73 (*)    Neutrophils Relative % 81 (*)    All other components within normal limits  COMPREHENSIVE METABOLIC PANEL - Abnormal; Notable for the following:    Potassium 2.9 (*)    Chloride 100 (*)    CO2 21 (*)    Glucose, Bld 203 (*)    ALT 12 (*)    Alkaline Phosphatase 558 (*)    Total Bilirubin 1.4 (*)    Anion gap 16 (*)    All other components within normal limits  LIPASE, BLOOD - Abnormal; Notable for the following:    Lipase 11 (*)    All other components within normal limits  LACTIC ACID, PLASMA - Abnormal; Notable for the following:    Lactic Acid, Venous 4.4 (*)    All other components within normal limits  LACTIC ACID, PLASMA - Abnormal; Notable for the following:    Lactic Acid, Venous 2.8 (*)    All other components within normal limits  URINALYSIS, ROUTINE W REFLEX MICROSCOPIC  (NOT AT [facility]) -  Abnormal; Notable for the following:    Glucose, UA 250 (*)    Hgb urine dipstick TRACE (*)    Protein, ur 30 (*)    All other components within normal limits  URINE MICROSCOPIC-ADD ON    Imaging Review Dg Chest 2 View  04/29/2015   CLINICAL DATA:  Lung cancer with osseous metastatic disease. Body aches.  EXAM: CHEST  2 VIEW  COMPARISON:  04/02/2015.  FINDINGS: The heart is enlarged. There is mild vascular congestion. Port-A-Cath tip good position SVC RA junction. Accentuation of RIGHT infrahilar markings is chronic.  The bones appear demineralized on the lateral radiograph, and are associated with rightward scoliosis. Metastatic disease is better visualized on PET scanning or cross-sectional imaging. Prior cervical fusion.  IMPRESSION: Widespread osseous metastatic disease better appreciated on PET scanning or cross-sectional imaging. No definite pathologic compression deformity. Cardiomegaly with mild vascular congestion. No acute chest findings are evident.   Electronically Signed   By: Rolla Flatten M.D.   On: 04/29/2015 02:28   Ct Abdomen Pelvis W Contrast  04/30/2015   CLINICAL DATA:  Generalized body aches and uncontrolled pain.  EXAM: CT ABDOMEN AND PELVIS WITH CONTRAST  TECHNIQUE: Multidetector CT imaging of the abdomen and pelvis was performed using the standard protocol following bolus administration of intravenous contrast.  CONTRAST:  141m OMNIPAQUE IOHEXOL 300 MG/ML  SOLN  COMPARISON:  02/17/2015  FINDINGS: There are diffuse lytic and sclerotic skeletal metastases with progression since 02/17/2015. No pathologic fracture is evident.  No hepatic or adrenal metastases are evident. There are unremarkable appearances of the liver, gallbladder and bile ducts, except for mildly generous caliber the extrahepatic biliary system which is unchanged.  The pancreas, spleen, adrenals and kidneys appear unremarkable.  There is a small hiatal hernia. Bowel is otherwise unremarkable. There  is no bowel obstruction. There is no focal inflammatory change in the abdomen or pelvis. There is no ascites.  Uterus and adnexal structures appear unremarkable.  .  There is no significant abnormality in the lower chest.  IMPRESSION: 1. Diffuse skeletal metastases with disease progression since 02/17/2015. 2. No acute findings are evident in the abdomen or pelvis. No hepatic or adrenal metastases. 3. Small hiatal hernia.   Electronically Signed   By: DAndreas NewportM.D.   On: 04/30/2015 04:47     EKG Interpretation EKG shows sinus tachycardia, no ST elevation or ischemia      MDM   Final diagnoses:  Dehydration  Chronic pain  Hypokalemia  Lactic acidosis    Patient with known metastatic cancer presents for the second, 24 hours with vomiting and pain. Pain is likely chronic in nature. However, patient was not vomiting 24 hours ago. Pain medications at home were not helping. Patient given fluids. Noted to be tachycardic and hypertensive on admission.  Lab work notable for lactic acidosis to 4.4. Hypokalemia 2.9.  Patient was given a total of 3 L of fluid with repeat lactate of 2.8. CT scan of the abdomen pelvis obtained and shows no evidence of acute abnormality. Known skeletal metastasis. Patient continues to endorse pain and vomiting. For this reason, will admit for fluids and pain management. Discussed with Dr. NBlaine Hamper    CMerryl Hacker MD 04/30/15 [phone]

## 2015-04-30 NOTE — Progress Notes (Signed)
Initial Nutrition Assessment  DOCUMENTATION CODES:  Non-severe (moderate) malnutrition in context of chronic illness  INTERVENTION: - Continue Resource Breeze po TID, each supplement provides 250 kcal and 9 grams of protein - Continue Ensure Enlive po BID, each supplement provides 350 kcal and 20 grams of protein - RD will continue to monitor for needs  NUTRITION DIAGNOSIS:  Inadequate oral intake related to nausea as evidenced by per patient/family report.  GOAL:  Patient will meet greater than or equal to 90% of their needs  MONITOR:  PO intake, Supplement acceptance, Weight trends, Labs, I & O's  REASON FOR ASSESSMENT:  Malnutrition Screening Tool  ASSESSMENT: Per H&P, 64 y.o. female with PMH of diffusely metastasized right upper lobe lung cancer (s/p of lobectomy in 2014), hypertension, diabetes mellitus, GERD, anxiety, bipolar disorder, chronic back pain, history of C. difficile colitis, COPD, seizure, HCV and HBV, who presents with a severe abdominal pain. Patient reports that in the past several days she has been having worsening severe abdominal pain. It constantly, 10 out of 10 in severity, nonradiating. It is associated with nausea or vomiting. She vomited once today. She does not have diarrhea, fever or chills.  Pt seen for MST with BMI WDL. Pt was NPO until about 10 AM today when diet was advanced to CLD; pt has not yet received a tray. She reports ongoing nausea since PTA and complains about severe, all over pain so physical assessment not done at this time. Pt states that her appetite has been poor for a few weeks and that during that time she lost 30 lbs from UBW of 157 lbs. Per weight hx review, pt has lost 11 lbs (8% body weight) in 2 months which is significant for time frame.   Unable to meet needs at this time. Ensure Enlive and [employer] have already been ordered; will assess acceptance and intakes of these at follow-up. Medications reviewed. Labs reviewed;  K: 2.9 mmol/L, Cl: 100 mmol/L.    Height:  Ht Readings from Last 1 Encounters:  04/30/15 '5\' 4"'$  (1.626 m)    Weight:  Wt Readings from Last 1 Encounters:  04/30/15 123 lb 4.8 oz (55.929 kg)    Ideal Body Weight:  54.5 kg (kg)  Wt Readings from Last 10 Encounters:  04/30/15 123 lb 4.8 oz (55.929 kg)  04/28/15 123 lb 4.8 oz (55.929 kg)  04/14/15 130 lb 4.8 oz (59.104 kg)  04/02/15 125 lb 10.6 oz (57 kg)  03/30/15 129 lb 8 oz (58.741 kg)  03/26/15 133 lb 1.6 oz (60.374 kg)  03/23/15 131 lb 9.6 oz (59.693 kg)  03/16/15 132 lb 6.4 oz (60.056 kg)  03/11/15 137 lb (62.143 kg)  02/26/15 134 lb (60.782 kg)    BMI:  Body mass index is 21.15 kg/(m^2).  Estimated Nutritional Needs:  Kcal:  1400-1600  Protein:  55-70 grams  Fluid:  2 L/day  Skin:  Reviewed, no issues  Diet Order:  Diet clear liquid Room service appropriate?: Yes; Fluid consistency:: Thin  EDUCATION NEEDS:  No education needs identified at this time   Intake/Output Summary (Last 24 hours) at 04/30/15 1150 Last data filed at 04/30/15 0940  Gross per 24 hour  Intake      0 ml  Output    400 ml  Net   -400 ml    Last BM:  PTA    Jarome Matin, RD, LDN Inpatient Clinical Dietitian Pager # [phone] After hours/weekend pager # [phone]

## 2015-05-01 DIAGNOSIS — E860 Dehydration: Secondary | ICD-10-CM

## 2015-05-01 DIAGNOSIS — A419 Sepsis, unspecified organism: Secondary | ICD-10-CM

## 2015-05-01 DIAGNOSIS — E440 Moderate protein-calorie malnutrition: Secondary | ICD-10-CM

## 2015-05-01 DIAGNOSIS — M4806 Spinal stenosis, lumbar region: Secondary | ICD-10-CM

## 2015-05-01 DIAGNOSIS — E43 Unspecified severe protein-calorie malnutrition: Secondary | ICD-10-CM

## 2015-05-01 DIAGNOSIS — E872 Acidosis, unspecified: Secondary | ICD-10-CM | POA: Insufficient documentation

## 2015-05-01 LAB — GLUCOSE, CAPILLARY
GLUCOSE-CAPILLARY: 128 mg/dL — AB (ref 65–99)
GLUCOSE-CAPILLARY: 135 mg/dL — AB (ref 65–99)
Glucose-Capillary: 101 mg/dL — ABNORMAL HIGH (ref 65–99)
Glucose-Capillary: 133 mg/dL — ABNORMAL HIGH (ref 65–99)

## 2015-05-01 LAB — COMPREHENSIVE METABOLIC PANEL
ALK PHOS: 437 U/L — AB (ref 38–126)
ALT: 9 U/L — ABNORMAL LOW (ref 14–54)
ANION GAP: 8 (ref 5–15)
AST: 16 U/L (ref 15–41)
Albumin: 3.1 g/dL — ABNORMAL LOW (ref 3.5–5.0)
BUN: 18 mg/dL (ref 6–20)
CO2: 23 mmol/L (ref 22–32)
CREATININE: 0.47 mg/dL (ref 0.44–1.00)
Calcium: 8.8 mg/dL — ABNORMAL LOW (ref 8.9–10.3)
Chloride: 111 mmol/L (ref 101–111)
GFR calc Af Amer: >60 mL/min (ref >60)
GFR calc non Af Amer: >60 mL/min (ref >60)
Glucose, Bld: 104 mg/dL — ABNORMAL HIGH (ref 65–99)
POTASSIUM: 3.3 mmol/L — AB (ref 3.5–5.1)
SODIUM: 142 mmol/L (ref 135–145)
TOTAL PROTEIN: 5.9 g/dL — AB (ref 6.5–8.1)
Total Bilirubin: 0.8 mg/dL (ref 0.3–1.2)

## 2015-05-01 LAB — CBC
HCT: 24.7 % — ABNORMAL LOW (ref 36.0–46.0)
Hemoglobin: 8.1 g/dL — ABNORMAL LOW (ref 12.0–15.0)
MCH: 29.1 pg (ref 26.0–34.0)
MCHC: 32.8 g/dL (ref 30.0–36.0)
MCV: 88.8 fL (ref 78.0–100.0)
Platelets: 57 10*3/uL — ABNORMAL LOW (ref 150–400)
RBC: 2.78 MIL/uL — AB (ref 3.87–5.11)
RDW: 16.8 % — ABNORMAL HIGH (ref 11.5–15.5)
WBC: 6.6 10*3/uL (ref 4.0–10.5)

## 2015-05-01 MED ORDER — POTASSIUM CHLORIDE CRYS ER 20 MEQ PO TBCR
40.0000 meq | EXTENDED_RELEASE_TABLET | Freq: Two times a day (BID) | ORAL | Status: AC
  Administered 2015-05-01 (×2): 40 meq via ORAL
  Filled 2015-05-01 (×2): qty 2

## 2015-05-01 NOTE — Evaluation (Addendum)
Occupational Therapy Evaluation Patient Details Name: [name] [name] MRN: [id number] DOB: [birth year] Today's Date: 05/01/2015    History of Present Illness 64 year old female with a past medical history of adenocarcinoma of right lung status post lobectomy having widespread bony metastatic disease, COPD, seizure, HCV and HBV, DM, chronic back, shoulder, and neck pain, bipolar disorder who was admitted 04/29/2015 with complaints of abdominal pain likely due to metastasized lung cancer with known bone mets   Clinical Impression   Educated pt on shower seat to help with safety with showering as well as a reacher to minimize back strain/pain. Case manager, pt would like to inquire how much a shower seat would cost. Would you address cost with pt? Will follow on acute for likely one more visit to continue education on AE and progress independence with self care tasks.    Follow Up Recommendations  No OT follow up;Supervision - Intermittent    Equipment Recommendations  Tub/shower seat (case manager, will you address cost with pt)    Recommendations for Other Services       Precautions / Restrictions Precautions Precautions: None Restrictions Weight Bearing Restrictions: No      Mobility Bed Mobility Overal bed mobility: Modified Independent             General bed mobility comments: OOB when OT arrived.  Transfers Overall transfer level: Needs assistance   Transfers: Sit to/from Stand Sit to Stand: Supervision              Balance                                            ADL Overall ADL's : Needs assistance/impaired Eating/Feeding: Independent Eating/Feeding Details (indicate cue type and reason): clear liquid. Grooming: Wash/dry hands;Set up;Sitting   Upper Body Bathing: Set up;Sitting   Lower Body Bathing: Min guard;Sit to/from stand   Upper Body Dressing : Set up;Sitting   Lower Body Dressing: Min guard;Sit to/from stand    Toilet Transfer: Min guard;Ambulation;Comfort height toilet;Grab bars   Toileting- Clothing Manipulation and Hygiene: Min guard;Sit to/from stand         General ADL Comments: Pt inquiring about a shower seat but explained there may be a cost so will request case manager address cost with pt. Educated pt on the reacher to help her with picking up items and even with doing laundry to help minimize bending and help with back pain. Pt very interested in obtaining a reacher so educated her on where to obtain and left info in her room. Pt able to cross LEs for LB dressing. She has a riser with handles at home for her commode.      Vision     Perception     Praxis      Pertinent Vitals/Pain Pain Assessment: 0-10 Pain Score: 5  Pain Location: reports bone pain from cancer pelvis and back Pain Descriptors / Indicators: Sore Pain Intervention(s): Limited activity within patient's tolerance;Monitored during session;Repositioned;Heat applied     Hand Dominance     Extremity/Trunk Assessment Upper Extremity Assessment Upper Extremity Assessment: Overall WFL for tasks assessed          Communication Communication Communication: No difficulties   Cognition Arousal/Alertness: Awake/alert Behavior During Therapy: WFL for tasks assessed/performed Overall Cognitive Status: Within Functional Limits for tasks assessed  General Comments       Exercises       Shoulder Instructions      Home Living Family/patient expects to be discharged to:: Private residence Living Arrangements: Children Available Help at Discharge: Family Type of Home: House       Home Layout: Able to live on main level with bedroom/bathroom     Bathroom Shower/Tub: Occupational psychologist: Standard     Home Equipment: Environmental consultant - 2 wheels;Cane - single point;Toilet riser   Additional Comments: pt lives alone with flight of stairs at home so plans to d/c home to  daughters house as above      Prior Functioning/Environment Level of Independence: Independent with assistive device(s)        Comments: uses assistive device as needed due to pain    OT Diagnosis: Generalized weakness   OT Problem List: Decreased strength;Decreased knowledge of use of DME or AE;Pain   OT Treatment/Interventions: Self-care/ADL training;Patient/family education;Therapeutic activities;DME and/or AE instruction    OT Goals(Current goals can be found in the care plan section) Acute Rehab OT Goals Patient Stated Goal: would like to return home when ready. OT Goal Formulation: With patient Time For Goal Achievement: 05/15/15 Potential to Achieve Goals: Good  OT Frequency: Min 2X/week   Barriers to D/C:            Co-evaluation              End of Session    Activity Tolerance: Patient tolerated treatment well Patient left: in chair;with call bell/phone within reach   Time: 1004-1028 OT Time Calculation (min): 24 min Charges:  OT General Charges $OT Visit: 1 Procedure OT Evaluation $Initial OT Evaluation Tier I: 1 Procedure OT Treatments $Therapeutic Activity: 8-22 mins G-Codes:    Jules Schick  [phone] 05/01/2015, 12:48 PM

## 2015-05-01 NOTE — Progress Notes (Signed)
[name] [name] was sitting up and awake in bed when I arrived. She shared her story of her cancer diagnosis and how it is progressed to date. She spoke of incredible faith regarding her condition. She said she wanted me to pray for her to eat and gain strength. She showed me a smoke patch, for which she wanted prayer also, that she would not continue to need the patch. [name] [name] also shared the loss of her son in 2009 and the same year had a brain tumor removed. She let those stories up, til now. Chaplain provided support through listening, positive affirmation and encouragement as needed. As requested, offered prayer. She was very appreciative of visit and prayer and asked for a return visit. Will follow up as time allows.  (pt was also appreciative of visit from [name] [name].)  Please page if needed for additional support. Ernest Haber Chaplain   05/01/15 1400  Clinical Encounter Type  Visited With Patient

## 2015-05-01 NOTE — Evaluation (Signed)
Physical Therapy One Time Evaluation Patient Details Name: [name] [name] MRN: [id number] DOB: [birth year] Today's Date: 05/01/2015   History of Present Illness  64 year old female with a past medical history of adenocarcinoma of right lung status post lobectomy having widespread bony metastatic disease, COPD, seizure, HCV and HBV, DM, chronic back, shoulder, and neck pain, bipolar disorder who was admitted 04/29/2015 with complaints of abdominal pain likely due to metastasized lung cancer with known bone mets  Clinical Impression  Patient evaluated by Physical Therapy with no further acute PT needs identified. All education has been completed and the patient has no further questions.  Pt mobilizing very well and plans to d/c to her daughter's home.  Heart rate monitored during session: preactivity 116 bpm, during gait up to 148 bpm, and then end of session 131 bpm with RN aware of tachycardia.  No further follow-up Physical Therapy or equipment needs. PT is signing off. Thank you for this referral.      Follow Up Recommendations No PT follow up    Equipment Recommendations  None recommended by PT    Recommendations for Other Services       Precautions / Restrictions Precautions Precautions: None Restrictions Weight Bearing Restrictions: No      Mobility  Bed Mobility Overal bed mobility: Modified Independent                Transfers Overall transfer level: Modified independent                  Ambulation/Gait Ambulation/Gait assistance: Supervision Ambulation Distance (Feet): 300 Feet Assistive device: None Gait Pattern/deviations: WFL(Within Functional Limits)     General Gait Details: no unsteady gait or LOB observed, pt reports feeling good to ambulate however HR elevated to 148 bpm during gait per telemetry monitor  Stairs            Wheelchair Mobility    Modified Rankin (Stroke Patients Only)       Balance                                              Pertinent Vitals/Pain Pain Assessment: 0-10 Pain Score: 5  Pain Location: reports bone pain from cancer pelvis and back Pain Descriptors / Indicators: Sore Pain Intervention(s): Limited activity within patient's tolerance;Monitored during session;Repositioned;Heat applied    Home Living Family/patient expects to be discharged to:: Private residence Living Arrangements: Children Available Help at Discharge: Family Type of Home: House       Home Layout: Able to live on main level with bedroom/bathroom Home Equipment: Gilford Rile - 2 wheels;Cane - single point Additional Comments: pt lives alone with flight of stairs at home so plans to d/c home to daughters house as above    Prior Function Level of Independence: Independent with assistive device(s)         Comments: uses assistive device as needed due to pain     Hand Dominance        Extremity/Trunk Assessment               Lower Extremity Assessment: Overall WFL for tasks assessed         Communication   Communication: No difficulties  Cognition Arousal/Alertness: Awake/alert Behavior During Therapy: WFL for tasks assessed/performed Overall Cognitive Status: Within Functional Limits for tasks assessed  General Comments      Exercises        Assessment/Plan    PT Assessment Patent does not need any further PT services  PT Diagnosis     PT Problem List    PT Treatment Interventions     PT Goals (Current goals can be found in the Care Plan section) Acute Rehab PT Goals PT Goal Formulation: All assessment and education complete, DC therapy    Frequency     Barriers to discharge        Co-evaluation               End of Session   Activity Tolerance: Patient tolerated treatment well Patient left: in chair;with call bell/phone within reach Nurse Communication: Mobility status (aware of HR)         Time: 9276-3943 PT  Time Calculation (min) (ACUTE ONLY): 13 min   Charges:   PT Evaluation $Initial PT Evaluation Tier I: 1 Procedure     PT G Codes:        [name],[name] 05/01/2015, 11:58 AM [name], PT, DPT 05/01/2015 Pager: [phone]

## 2015-05-02 LAB — CBC
HCT: 24.5 % — ABNORMAL LOW (ref 36.0–46.0)
HEMOGLOBIN: 7.9 g/dL — AB (ref 12.0–15.0)
MCH: 28.9 pg (ref 26.0–34.0)
MCHC: 32.2 g/dL (ref 30.0–36.0)
MCV: 89.7 fL (ref 78.0–100.0)
Platelets: 62 10*3/uL — ABNORMAL LOW (ref 150–400)
RBC: 2.73 MIL/uL — AB (ref 3.87–5.11)
RDW: 16.9 % — ABNORMAL HIGH (ref 11.5–15.5)
WBC: 7.4 10*3/uL (ref 4.0–10.5)

## 2015-05-02 LAB — GLUCOSE, CAPILLARY
GLUCOSE-CAPILLARY: 147 mg/dL — AB (ref 65–99)
Glucose-Capillary: 107 mg/dL — ABNORMAL HIGH (ref 65–99)
Glucose-Capillary: 129 mg/dL — ABNORMAL HIGH (ref 65–99)
Glucose-Capillary: 136 mg/dL — ABNORMAL HIGH (ref 65–99)

## 2015-05-02 LAB — BASIC METABOLIC PANEL
Anion gap: 6 (ref 5–15)
BUN: 14 mg/dL (ref 6–20)
CALCIUM: 8.9 mg/dL (ref 8.9–10.3)
CO2: 24 mmol/L (ref 22–32)
CREATININE: 0.44 mg/dL (ref 0.44–1.00)
Chloride: 112 mmol/L — ABNORMAL HIGH (ref 101–111)
GLUCOSE: 99 mg/dL (ref 65–99)
POTASSIUM: 4.2 mmol/L (ref 3.5–5.1)
SODIUM: 142 mmol/L (ref 135–145)

## 2015-05-02 MED ORDER — MORPHINE SULFATE ER 30 MG PO TBCR: 30.0000 mg | EXTENDED_RELEASE_TABLET | ORAL | Status: DC

## 2015-05-02 MED ORDER — MORPHINE SULFATE ER 30 MG PO TBCR
30.0000 mg | EXTENDED_RELEASE_TABLET | Freq: Once | ORAL | Status: AC
Start: 2015-05-02 — End: 2015-05-02
  Administered 2015-05-02: 30 mg via ORAL
  Filled 2015-05-02: qty 1

## 2015-05-02 MED ORDER — POLYETHYLENE GLYCOL 3350 17 G PO PACK
17.0000 g | PACK | Freq: Every day | ORAL | Status: DC
  Administered 2015-05-02 – 2015-05-05 (×4): 17 g via ORAL
  Filled 2015-05-02 (×7): qty 1

## 2015-05-02 MED ORDER — FENTANYL 25 MCG/HR TD PT72
25.0000 ug | MEDICATED_PATCH | TRANSDERMAL | Status: DC
  Administered 2015-05-02: 25 ug via TRANSDERMAL
  Filled 2015-05-02: qty 1

## 2015-05-02 MED ORDER — MORPHINE SULFATE ER 30 MG PO TBCR: 60.0000 mg | EXTENDED_RELEASE_TABLET | Freq: Two times a day (BID) | ORAL | Status: DC

## 2015-05-02 MED ORDER — MORPHINE SULFATE ER 30 MG PO TBCR: 30.0000 mg | EXTENDED_RELEASE_TABLET | Freq: Once | ORAL | Status: DC

## 2015-05-02 MED ORDER — OXYCODONE HCL 5 MG PO TABS
10.0000 mg | ORAL_TABLET | ORAL | Status: DC | PRN
  Administered 2015-05-02 – 2015-05-03 (×4): 10 mg via ORAL
  Filled 2015-05-02 (×4): qty 2

## 2015-05-02 NOTE — Progress Notes (Addendum)
Pt HR 155-175, BP 189/120. Generalized pain 10/10. Pt denies SOB, or chest pain. Dilaudid and Hydralazine give per orders. MD paged. Awaiting return call. Will continue to monitor.

## 2015-05-02 NOTE — Progress Notes (Signed)
[facility] PROGRESS NOTE  [name] [name] ZOX:[id number] DOB: Aug 06, 1951 DOA: 04/29/2015 PCP: Harvie Junior, MD  Assessment/Plan: Abdominal pain:  - Suspect chronic severe abdominal pain that is likely due to metastasized lung cancer with known bone mets. Abdomen/pelvis showed diffuse metastasized disease that appears worse from prior studies. Lipase negative. Urinalysis is negative. -Continued on dilaudid 2 mg q2h,  -Increased MS Contin dose from 20 to 30 mg twice a day, then to 29m BID - Added 149moxycodone PRN -Continue Zofran for nausea -Pain initially seemed nonexistent 6/11, but now seems very poorly controlled  -discussed case with Dr. FeBurr Medicorecs for lumbar/thoracic MRI to r/o cord compression - Discussed with Palliative Care who will see pt tomorrow. For now, recs to transition to fentanyl patch  Tachycardia that is unlikely SIRS - Patient met criteria for SIRS, with elevated lactate and tachycardia, no source of infection identified, however, procalcitonin is <0.10 suggesting non-infectious pathology - Likely due to metastatic cancer and severe pain - blood culture neg x 2  Metastasized lung cancer, right upper lobe:  - Pt with stage 1 right lung cancer status post lobectomy in 2014, now with diffuse metastasis, including bone inta-abdomen and lung. -Appreciate input by Dr. FeBurr MedicoContinue Decadron 4 mg daily -Thoracic/lumbar MRI per above  Anxiety:  - Seems to be stable. No suicidal or homicidal ideations.  -Continue Xanax.  HTN:  - Blood pressure poorly controlled, likely result of acute pain -Hydralazine IV when necessary -Pain control as above -Metoprolol scheduled - Also on PRN labetalol IV for HR>115 or uncontrolled BP  Seizure:  - continued oxcabazepine - Stable Currently  DM-II:  - Last A1c 6.1 on 03/08/15, well controled.  - Patient on metformin at home - Cont on SSI  Protein-calorie malnutrition, severe: - Ensure - Nutrition  following  COPD:  - Stable. No signs of acute exacerbation. -DuoNeb PRN  GERD: - Protonix  Hypokalemia:  - K= 2.9 on admission. - Replaced  Hypomagnesemia - Replaced  DVT ppx:  - SCD  Code Status: Full Family Communication: Pt in room Disposition Plan: Pending   Consultants:  Oncology  Palliative Care  Procedures:    Antibiotics:    HPI/Subjective: Complains of marked abd, hip, leg pain  Objective: Filed Vitals:   05/02/15 1211 05/02/15 1224 05/02/15 1300 05/02/15 1433  BP: 171/94 152/97 162/89 169/111  Pulse: 175  178 144  Temp:    99.2 F (37.3 C)  TempSrc:    Oral  Resp:    16  Height:      Weight:      SpO2:    100%    Intake/Output Summary (Last 24 hours) at 05/02/15 1515 Last data filed at 05/02/15 1300  Gross per 24 hour  Intake 1813.34 ml  Output   3225 ml  Net -1411.66 ml   Filed Weights   04/30/15 0900  Weight: 55.929 kg (123 lb 4.8 oz)    Exam:   General:  Awake, laying in bed, appears to be in marked pain  Cardiovascular: regular, s1, s2  Respiratory: normal resp effort, no wheezing  Abdomen: soft,nondistended, generally tender  Musculoskeletal: perfused, no clubbing, no cyanosis  Data Reviewed: Basic Metabolic Panel:  Recent Labs Lab 04/28/15 1305 04/28/15 2327 04/30/15 0045 04/30/15 0915 05/01/15 0530 05/02/15 0359  NA 141 138 137  --  142 142  K 3.7 3.7 2.9*  --  3.3* 4.2  CL  --  105 100*  --  111 112*  CO2 25 24 21*  --  23 24  GLUCOSE 116 113* 203*  --  104* 99  BUN 14.5 13 16   --  18 14  CREATININE 0.7 0.54 0.58  --  0.47 0.44  CALCIUM 8.6 8.7* 9.5  --  8.8* 8.9  MG  --   --   --  1.7  --   --    Liver Function Tests:  Recent Labs Lab 04/28/15 1305 04/28/15 2327 04/30/15 0045 05/01/15 0530  AST 13 18 25 16   ALT <6 10* 12* 9*  ALKPHOS 457* 464* 558* 437*  BILITOT 0.46 0.7 1.4* 0.8  PROT 6.0* 6.4* 7.6 5.9*  ALBUMIN 3.0* 3.2* 3.8 3.1*    Recent Labs Lab 04/30/15 0045  LIPASE 11*    No results for input(s): AMMONIA in the last 168 hours. CBC:  Recent Labs Lab 04/28/15 1305 04/28/15 2327 04/30/15 0045 05/01/15 0530 05/02/15 0359  WBC 6.7 7.9 9.3 6.6 7.4  NEUTROABS 5.2 6.2 7.5  --   --   HGB 8.8* 9.0* 11.0* 8.1* 7.9*  HCT 27.7* 28.4* 33.0* 24.7* 24.5*  MCV 90.2 89.0 87.1 88.8 89.7  PLT 74* 69* 73* 57* 62*   Cardiac Enzymes: No results for input(s): CKTOTAL, CKMB, CKMBINDEX, TROPONINI in the last 168 hours. BNP (last 3 results) No results for input(s): BNP in the last 8760 hours.  ProBNP (last 3 results) No results for input(s): PROBNP in the last 8760 hours.  CBG:  Recent Labs Lab 05/01/15 0739 05/01/15 1155 05/01/15 1656 05/01/15 2202 05/02/15 0718  GLUCAP 101* 133* 128* 135* 107*    Recent Results (from the past 240 hour(s))  TECHNOLOGIST REVIEW     Status: None   Collection Time: 04/28/15  1:05 PM  Result Value Ref Range Status   Technologist Review Metas and Myelocytes present. 4% nrbcs  Final  Culture, blood (x 2)     Status: None (Preliminary result)   Collection Time: 04/30/15  8:40 AM  Result Value Ref Range Status   Specimen Description BLOOD LEFT FINGER  Final   Special Requests BOTTLES DRAWN AEROBIC ONLY 0.5 CC  Final   Culture   Final           BLOOD CULTURE RECEIVED NO GROWTH TO DATE CULTURE WILL BE HELD FOR 5 DAYS BEFORE ISSUING A FINAL NEGATIVE REPORT Note: Culture results may be compromised due to an inadequate volume of blood received in culture bottles. Performed at [facility]    Report Status PENDING  Incomplete  Culture, blood (x 2)     Status: None (Preliminary result)   Collection Time: 04/30/15 11:15 AM  Result Value Ref Range Status   Specimen Description BLOOD RIGHT ARM  Final   Special Requests BOTTLES DRAWN AEROBIC ONLY 10 CC BLUE  Final   Culture   Final           BLOOD CULTURE RECEIVED NO GROWTH TO DATE CULTURE WILL BE HELD FOR 5 DAYS BEFORE ISSUING A FINAL NEGATIVE REPORT Performed at [facility]    Report Status PENDING  Incomplete     Studies: No results found.  Scheduled Meds: . ALPRAZolam  0.5 mg Oral BID  . cholecalciferol  1,000 Units Oral Daily  . dexamethasone  4 mg Oral Daily  . dicyclomine  20 mg Oral TID WC  . feeding supplement (ENSURE ENLIVE)  237 mL Oral BID BM  . feeding supplement (RESOURCE BREEZE)  1 Container Oral TID BM  . fentaNYL  25 mcg Transdermal Q72H  .  fluconazole  100 mg Oral Daily  . folic acid  1 mg Oral Daily  . gabapentin  400 mg Oral TID  . insulin aspart  0-9 Units Subcutaneous TID WC  . irbesartan  300 mg Oral Daily  . loratadine  10 mg Oral q morning - 10a  . metoCLOPramide  5 mg Oral TID AC  . metoprolol tartrate  25 mg Oral Daily  . nicotine  21 mg Transdermal Daily  . OXcarbazepine  150 mg Oral TID  . pantoprazole  40 mg Oral Daily  . polyethylene glycol  17 g Oral Daily  . simvastatin  20 mg Oral QHS  . sodium chloride  3 mL Intravenous Q12H   Continuous Infusions: . sodium chloride 100 mL/hr at 05/02/15 0802    Principal Problem:   Abdominal pain Active Problems:   Lung cancer, right upper lobe   Anxiety disorder   Bipolar 1 disorder   Hepatitis C   Hepatitis B   Hypertension   Diabetes mellitus   Spinal stenosis of lumbar region L L%-S1   Metastatic primary lung cancer   Osseous metastasis   Metastatic cancer to bone   Depression   Palliative care encounter   Generalized anxiety disorder   Protein-calorie malnutrition, severe   AP (abdominal pain)   SIRS (systemic inflammatory response syndrome)   Hypokalemia   GERD (gastroesophageal reflux disease)   Malnutrition of moderate degree   Chronic pain   Lactic acidosis   Benford Asch, [facility] Pager [phone]. If 7PM-7AM, please contact night-coverage at [email or web address], password [facility] 05/02/2015, 3:15 PM  LOS: 2 days

## 2015-05-02 NOTE — Progress Notes (Signed)
[name] [name]   DOB:[birth year]   ZH#:[id number]   GEX#:[id number]  Subjective: Pt was better yesterday, eating, and walked with physical therapist. She complains much worse back and abdominal pain this morning, became hypertensive and sinus tachycardia. She is moaning and turning in the bed when I see her.    Objective:  Filed Vitals:   05/02/15 1300  BP: 162/89  Pulse: 178  Temp:   Resp:     Body mass index is 21.15 kg/(m^2).  Intake/Output Summary (Last 24 hours) at 05/02/15 1349 Last data filed at 05/02/15 1010  Gross per 24 hour  Intake 2613.34 ml  Output   2275 ml  Net 338.34 ml     Sclerae unicteric  Oropharynx clear  No peripheral adenopathy  Lungs clear -- no rales or rhonchi  Heart regular rate and rhythm  Abdomen, soft, slight diffuse tenderness  MSK no focal spinal tenderness, no peripheral edema  Neuro, slight weakness on low extremities, symetric, no other focal findings     CBG (last 3)   Recent Labs  05/01/15 1656 05/01/15 2202 05/02/15 0718  GLUCAP 128* 135* 107*     Labs:  Lab Results  Component Value Date   WBC 7.4 05/02/2015   HGB 7.9* 05/02/2015   HCT 24.5* 05/02/2015   MCV 89.7 05/02/2015   PLT 62* 05/02/2015   NEUTROABS 7.5 04/30/2015    '@LASTCHEMISTRY'$ @  Urine Studies No results for input(s): UHGB, CRYS in the last 72 hours.  Invalid input(s): UACOL, UAPR, USPG, UPH, UTP, UGL, [place], UBIL, UNIT, UROB, [place], UEPI, UWBC, [place], [place], [place], [place], [place]  Basic Metabolic Panel:  Recent Labs Lab 04/28/15 1305  04/28/15 2327 04/30/15 0045 04/30/15 0915 05/01/15 0530 05/02/15 0359  NA 141  --  138 137  --  142 142  K 3.7  < > 3.7 2.9*  --  3.3* 4.2  CL  --   --  105 100*  --  111 112*  CO2 25  --  24 21*  --  23 24  GLUCOSE 116  --  113* 203*  --  104* 99  BUN 14.5  --  13 16  --  18 14  CREATININE 0.7  --  0.54 0.58  --  0.47 0.44  CALCIUM 8.6  --  8.7* 9.5  --  8.8* 8.9  MG  --   --   --   --  1.7  --   --   < > = values in  this interval not displayed. GFR Estimated Creatinine Clearance: 59.7 mL/min (by C-G formula based on Cr of 0.44). Liver Function Tests:  Recent Labs Lab 04/28/15 1305 04/28/15 2327 04/30/15 0045 05/01/15 0530  AST '13 18 25 16  '$ ALT <6 10* 12* 9*  ALKPHOS 457* 464* 558* 437*  BILITOT 0.46 0.7 1.4* 0.8  PROT 6.0* 6.4* 7.6 5.9*  ALBUMIN 3.0* 3.2* 3.8 3.1*    Recent Labs Lab 04/30/15 0045  LIPASE 11*   No results for input(s): AMMONIA in the last 168 hours. Coagulation profile  Recent Labs Lab 04/30/15 0915  INR 1.54*    CBC:  Recent Labs Lab 04/28/15 1305 04/28/15 2327 04/30/15 0045 05/01/15 0530 05/02/15 0359  WBC 6.7 7.9 9.3 6.6 7.4  NEUTROABS 5.2 6.2 7.5  --   --   HGB 8.8* 9.0* 11.0* 8.1* 7.9*  HCT 27.7* 28.4* 33.0* 24.7* 24.5*  MCV 90.2 89.0 87.1 88.8 89.7  PLT 74* 69* 73* 57* 62*   Cardiac Enzymes:  No results for input(s): CKTOTAL, CKMB, CKMBINDEX, TROPONINI in the last 168 hours. BNP: Invalid input(s): POCBNP CBG:  Recent Labs Lab 05/01/15 0739 05/01/15 1155 05/01/15 1656 05/01/15 2202 05/02/15 0718  GLUCAP 101* 133* 128* 135* 107*   D-Dimer No results for input(s): DDIMER in the last 72 hours. Hgb A1c No results for input(s): HGBA1C in the last 72 hours. Lipid Profile No results for input(s): CHOL, HDL, LDLCALC, TRIG, CHOLHDL, LDLDIRECT in the last 72 hours. Thyroid function studies No results for input(s): TSH, T4TOTAL, T3FREE, THYROIDAB in the last 72 hours.  Invalid input(s): FREET3 Anemia work up No results for input(s): VITAMINB12, FOLATE, FERRITIN, TIBC, IRON, RETICCTPCT in the last 72 hours. Microbiology Recent Results (from the past 240 hour(s))  TECHNOLOGIST REVIEW     Status: None   Collection Time: 04/28/15  1:05 PM  Result Value Ref Range Status   Technologist Review Metas and Myelocytes present. 4% nrbcs  Final  Culture, blood (x 2)     Status: None (Preliminary result)   Collection Time: 04/30/15  8:40 AM  Result  Value Ref Range Status   Specimen Description BLOOD LEFT FINGER  Final   Special Requests BOTTLES DRAWN AEROBIC ONLY 0.5 CC  Final   Culture   Final           BLOOD CULTURE RECEIVED NO GROWTH TO DATE CULTURE WILL BE HELD FOR 5 DAYS BEFORE ISSUING A FINAL NEGATIVE REPORT Note: Culture results may be compromised due to an inadequate volume of blood received in culture bottles. Performed at [facility]    Report Status PENDING  Incomplete  Culture, blood (x 2)     Status: None (Preliminary result)   Collection Time: 04/30/15 11:15 AM  Result Value Ref Range Status   Specimen Description BLOOD RIGHT ARM  Final   Special Requests BOTTLES DRAWN AEROBIC ONLY 10 CC BLUE  Final   Culture   Final           BLOOD CULTURE RECEIVED NO GROWTH TO DATE CULTURE WILL BE HELD FOR 5 DAYS BEFORE ISSUING A FINAL NEGATIVE REPORT Performed at [facility]    Report Status PENDING  Incomplete      Studies:  No results found.  Assessment: 64 y.o. 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   1. 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 Intermittent abdominal pain and nausea 3. Bipolar 4. Anxiety/ddepression 5. DM 6. HTN 7. Hep C 8. Worsening back and abdominal pain    Plan: -She has similar episodes of sudden onset abdominal pain, nausea before, etiology not very clear, possible combination of bone metasteses, constipation and psychological component  -given the know diffuse spinal bone mets, especially the T12 lesion, and some weakness on LE, please consider a thoracic and lumber spine MRI to rule out cord compression aggressive bowel regimen for constipation, consider change long acting pain meds to fetanyl patch (covered by her insurance), stating at 56mg/hr -she would benefit from palliative consult  -supportive care.  I will follow up.    FTruitt Merle MD 05/02/2015  1:49 PM

## 2015-05-02 NOTE — Progress Notes (Signed)
Resumed care of patient. Agree with previous assessment.

## 2015-05-02 NOTE — Progress Notes (Signed)
Pt HR sustaining 170-182. MD notified. Will continue to monitor.

## 2015-05-02 NOTE — Progress Notes (Signed)
MD return page. New orders placed in Epic by provider. Will continue to monitor.

## 2015-05-02 NOTE — Progress Notes (Signed)
Pt rating pain 10/10 in  Left hip and knee. Pt received Dilaudid '2mg'$  IV at 0750. Pt  Sinus Tach on monitor.with HR of 140-152.  MD notified. Will continue to monitor.

## 2015-05-03 ENCOUNTER — Inpatient Hospital Stay (HOSPITAL_COMMUNITY): Payer: Medicare HMO

## 2015-05-03 ENCOUNTER — Encounter: Payer: Self-pay | Admitting: Nutrition

## 2015-05-03 ENCOUNTER — Telehealth: Payer: Self-pay | Admitting: *Deleted

## 2015-05-03 DIAGNOSIS — K5903 Drug induced constipation: Secondary | ICD-10-CM | POA: Insufficient documentation

## 2015-05-03 DIAGNOSIS — M792 Neuralgia and neuritis, unspecified: Secondary | ICD-10-CM | POA: Insufficient documentation

## 2015-05-03 DIAGNOSIS — F419 Anxiety disorder, unspecified: Secondary | ICD-10-CM

## 2015-05-03 DIAGNOSIS — F329 Major depressive disorder, single episode, unspecified: Secondary | ICD-10-CM | POA: Insufficient documentation

## 2015-05-03 DIAGNOSIS — T402X5A Adverse effect of other opioids, initial encounter: Secondary | ICD-10-CM | POA: Insufficient documentation

## 2015-05-03 LAB — GLUCOSE, CAPILLARY
GLUCOSE-CAPILLARY: 145 mg/dL — AB (ref 65–99)
Glucose-Capillary: 117 mg/dL — ABNORMAL HIGH (ref 65–99)
Glucose-Capillary: 145 mg/dL — ABNORMAL HIGH (ref 65–99)
Glucose-Capillary: 166 mg/dL — ABNORMAL HIGH (ref 65–99)

## 2015-05-03 MED ORDER — BISACODYL 10 MG RE SUPP
10.0000 mg | Freq: Every day | RECTAL | Status: DC | PRN
  Administered 2015-05-03: 10 mg via RECTAL
  Filled 2015-05-03 (×2): qty 1

## 2015-05-03 MED ORDER — OXYCODONE HCL 5 MG PO TABS: 10.0000 mg | ORAL_TABLET | ORAL | Status: DC | PRN

## 2015-05-03 MED ORDER — OXYCODONE HCL 5 MG PO TABS
10.0000 mg | ORAL_TABLET | ORAL | Status: DC | PRN
Start: 2015-05-03 — End: 2015-05-04
  Administered 2015-05-03 – 2015-05-04 (×9): 15 mg via ORAL
  Filled 2015-05-03 (×9): qty 3

## 2015-05-03 MED ORDER — OXYCODONE HCL ER 40 MG PO T12A
40.0000 mg | EXTENDED_RELEASE_TABLET | Freq: Three times a day (TID) | ORAL | Status: DC
  Administered 2015-05-03 – 2015-05-05 (×6): 40 mg via ORAL
  Filled 2015-05-03 (×6): qty 1

## 2015-05-03 MED ORDER — HYDROMORPHONE HCL 1 MG/ML IJ SOLN
1.0000 mg | INTRAMUSCULAR | Status: DC | PRN
  Administered 2015-05-03 – 2015-05-04 (×7): 1 mg via INTRAVENOUS
  Filled 2015-05-03 (×7): qty 1

## 2015-05-03 MED ORDER — SENNOSIDES-DOCUSATE SODIUM 8.6-50 MG PO TABS
2.0000 | ORAL_TABLET | Freq: Two times a day (BID) | ORAL | Status: DC
  Administered 2015-05-03 – 2015-05-07 (×9): 2 via ORAL
  Filled 2015-05-03 (×9): qty 2

## 2015-05-03 MED ORDER — DULOXETINE HCL 30 MG PO CPEP
30.0000 mg | ORAL_CAPSULE | Freq: Every day | ORAL | Status: DC
  Administered 2015-05-03 – 2015-05-07 (×5): 30 mg via ORAL
  Filled 2015-05-03 (×5): qty 1

## 2015-05-03 NOTE — Care Management Note (Signed)
Case Management Note  Patient Details  Name: [name] [name] MRN: [id number] Date of Birth: [birth year]  Subjective/Objective: Pain control issues.Palliative care team following.                   Action/Plan:d/c plan home.   Expected Discharge Date:                  Expected Discharge Plan:  Home/Self Care  In-House Referral:     Discharge planning Services     Post Acute Care Choice:    Choice offered to:     DME Arranged:    DME Agency:     HH Arranged:    HH Agency:     Status of Service:  In process, will continue to follow  Medicare Important Message Given:  Yes Date Medicare IM Given:  05/03/15 Medicare IM give by:  Dessa Phi Date Additional Medicare IM Given:    Additional Medicare Important Message give by:     If discussed at [place] of Stay Meetings, dates discussed:    Additional Comments:  Dessa Phi, RN 05/03/2015, 4:20 PM

## 2015-05-03 NOTE — Consult Note (Addendum)
Consultation Note Date: 05/03/2015   Patient Name: [name] [name]  DOB: Dec 17, 1950  MRN: [id number]  Age / Sex: 64 y.o., female   PCP: Harvie Junior, MD Referring Physician: Debbe Odea, MD  Reason for Consultation: Establishing goals of care and Pain control  Palliative Care Assessment and Plan Summary of Established Goals of Care and Medical Treatment Preferences   Clinical Assessment/Narrative: 64 yo female with PMHx of stage IV NSCLC admitted with abdominal pain, N/V  Contacts/Participants in Discussion: Primary Decision Maker: patient   HCPOA: no challenging because she has some strained relationship with her daughter.   [name] is known to me from previous admission. She has poor health literacy and we spent a good deal of time talking about where things are at with her cancer and information we know about her disease.  She understands that there are potentially plans for chemotherapy down the road but her strength (performance status important in this).  We discussed why that is so important.  Her support system is complicated by challenging relationship with her children and her underlying bipolar disorder.  She is hopeful of returning home after this hospital stay and following up with Dr Burr Medico.  Unfortunately we do not have outaptient palliative care services as she would be ideal candidate for this type of close follow-up.   Code Status/Advance Care Planning:  Full  Symptom Management:   Cancer related pain- used equivalent of 451m oral morphine in IV dilaudid over past 24 hrs. Additional 40 of oxycodone as well. Multifactorial in nature and possibly some neuropathic component as well. I think her anxiety/depression and perhaps even prior history of substance abuse play role as well. We talked about this and risks of opioid use/misuse. Was most recently on oxycontin at home (did get insurance approval). This was at lower dose than we discharged her on in April.  Morphine  caused pruritis.  Started on Fentanyl 2108m/h patch here.  This is fairly low dose considering her 24hr opioid use.  Given her difficult to control pain, I think fentanyl patches will be challenging to adjust (generally shouldn't adjust more than every 48-72h). Since oxycontin had worked well in past, will d/c patch and start oxycontin 4049mID. Will change oxycodone to 10-82m62mhr PRN.  Will decrease iV dilaudid to 1mg 58m PRN. Expect we will be able to get off IV meds in next 24-48h.  I think a lot of this pain is potentially worse form her malignancy. Can consider bisphosp if not given in onc clinic or denosumab.  Can also see if rad/onc can do something about spinal disease (though diffuse) if not already done.  Will also treat anxiety/depression, constipation as below which likely contribute.   Constipation- add stim laxative with senna (given high dose opioids) to miralax. Monitor. Bisacodyl PRN  Neuropathic pain- related to DM and perhaps from spinal disease.  Add cymbalta. Continue neurontin  Anxiety/Depression- on BID xanax. Cautious with bezo's and high dose opioids. Add cymbalta as above  N/V- resolved  Psycho-social/Spiritual:   Support System: She had 7 children in total. 2 have died. [place] of [place] children live in [place] for her daughter [place]ives here. She admits to strain with all her children, even Andoria. She feels close with Andoria's 3 small children who are ages 8,5, 62,5 10mon58month has a strong spiritual faith and appreciates spiritual care support.   Desire for further Chaplaincy support:yes  Prognosis: Unable to determine  Discharge Planning:  TBD  Chief Complaint: Abd Pain  History of Present Illness:  64 yo female with PMHx of stage IV NSCLC, DM, Bipolar, Hep C.  She is known to me from admission in April where she had cancer related pain with diffuse spinal mets.  She was on oxycontin/oxycodone combination at that time. Was switched to  morphine but caused pruritis and then switched back to oxycontin/opxycodone in follow-up. Her latest dose of OxyContin appeared to be 70m BID.  She was admitted again with complaint of several days of worsening low back/abdominal pain. She tells me today that the pain seems to focus in back and radiate into abdomen as well as hip.  She did recently complete course of radiation therapy to bone met in left pelvis.  She had associated N/V which has now resolved. She continues to struggle with anxiety/depression and no longer taking celexa which he had previously prescribed and she is not sure why this was stopped. Currently she has been rotated to fentanyl patch and IV dilaudid 292mq2hr PRN. She has used dilaudid very frequently but pain is under good control this morning. Pain is constant but improved.  She has had issues with constipation and last had BM Friday which was small and hard.  No other acute complaints at this time. MRI/CT here reveal progressive skeletal metastatic disease.   Primary Diagnoses  Present on Admission:  . Spinal stenosis of lumbar region L L%-S1 . Protein-calorie malnutrition, severe . Osseous metastasis . Metastatic primary lung cancer . Metastatic cancer to bone . Lung cancer, right upper lobe . Hypertension . Hepatitis C . Hepatitis B . Generalized anxiety disorder . Depression . Bipolar 1 disorder . AP (abdominal pain) . Anxiety disorder . Abdominal pain . SIRS (systemic inflammatory response syndrome) . Hypokalemia . GERD (gastroesophageal reflux disease)   I have reviewed the medical record, interviewed the patient and family, and examined the patient. The following aspects are pertinent.  Past Medical History  Diagnosis Date  . Hypertension   . Bipolar 1 disorder   . Heart murmur   . Sciatica   . Arthritis   . Anxiety   . Diverticulosis   . Chronic low back pain   . DDD (degenerative disc disease), lumbar   . Chronic shoulder pain   . Chronic  neck pain   . Hepatitis B     Unclear when initially diagnosed, labs in Epic from 03/09/13  . Hepatitis C     Unclear when initially diagnosed, labs in Epic from 03/09/13  . C. difficile diarrhea     April and February 2014  . COPD (chronic obstructive pulmonary disease)   . GERD (gastroesophageal reflux disease)   . H/O hiatal hernia   . Headache(784.0)     hx  . PONV (postoperative nausea and vomiting)   . Seizures     on meds  . Diabetes mellitus   . Adenocarcinoma of lung, stage 1   . GERD (gastroesophageal reflux disease)    History   Social History  . Marital Status: Widowed    Spouse Name: N/A  . Number of Children: N/A  . Years of Education: N/A   Occupational History  . n/a     patient draws [employer]   Social History Main Topics  . Smoking status: Former Smoker -- 0.25 packs/day for 10 years    Types: Cigarettes    Quit date: 07/07/2013  . Smokeless tobacco: Never Used     Comment: Smokes pk q 3 days  marajuna aug  . Alcohol Use: No     Comment: quit 12yr ago  . Drug Use: Yes    Special: Hydrocodone, Marijuana     Comment: "maybe about once a month"  . Sexual Activity: No   Other Topics Concern  . None   Social History Narrative   Family History  Problem Relation Age of Onset  . Hypertension Father     deceased  . Diabetes Father   . Heart disease Father   . Hyperlipidemia Father   . Hypertension Mother   . Hyperlipidemia Mother   . Diabetes Mother   . Hypertension [name]   . Hyperlipidemia [name]   . Hypertension Brother   . Hyperlipidemia Brother    Scheduled Meds: . ALPRAZolam  0.5 mg Oral BID  . cholecalciferol  1,000 Units Oral Daily  . dexamethasone  4 mg Oral Daily  . dicyclomine  20 mg Oral TID WC  . DULoxetine  30 mg Oral Daily  . feeding supplement (ENSURE ENLIVE)  237 mL Oral BID BM  . feeding supplement (RESOURCE BREEZE)  1 Container Oral TID BM  . fluconazole  100 mg Oral Daily  . folic acid  1 mg Oral Daily  . gabapentin   400 mg Oral TID  . insulin aspart  0-9 Units Subcutaneous TID WC  . irbesartan  300 mg Oral Daily  . loratadine  10 mg Oral q morning - 10a  . metoCLOPramide  5 mg Oral TID AC  . metoprolol tartrate  25 mg Oral Daily  . nicotine  21 mg Transdermal Daily  . OXcarbazepine  150 mg Oral TID  . OxyCODONE  40 mg Oral 3 times per day  . pantoprazole  40 mg Oral Daily  . polyethylene glycol  17 g Oral Daily  . senna-docusate  2 tablet Oral BID  . simvastatin  20 mg Oral QHS  . sodium chloride  3 mL Intravenous Q12H   Continuous Infusions: . sodium chloride 100 mL/hr at 05/03/15 0327   PRN Meds:.diphenhydrAMINE, hydrALAZINE, HYDROmorphone (DILAUDID) injection, ipratropium-albuterol, labetalol, magnesium hydroxide, MUSCLE RUB, ondansetron, oxyCODONE, promethazine, sodium chloride Medications Prior to Admission:  Prior to Admission medications   Medication Sig Start Date End Date Taking? Authorizing Provider  ALPRAZolam (Duanne Moron 1 MG tablet Take 0.5 mg by mouth 2 (two) times daily.    Yes Historical Provider, MD  cholecalciferol (VITAMIN D) 1000 UNITS tablet Take 1,000 Units by mouth daily.   Yes Historical Provider, MD  Cyanocobalamin (VITAMIN B-12 IJ) Inject as directed every 30 (thirty) days. Administered by Dr. DYork Ramaround the 1st of each month   Yes Historical Provider, MD  dexamethasone (DECADRON) 4 MG tablet Take 1 tablet (4 mg total) by mouth daily. 03/12/15  Yes GOswald Hillock MD  fluconazole (DIFLUCAN) 100 MG tablet Take 1 tablet (100 mg total) by mouth daily. 03/26/15  Yes MTyler Pita MD  folic acid (FOLVITE) 1 MG tablet Take 1 tablet (1 mg total) by mouth daily. 03/23/15  Yes YTruitt Merle MD  gabapentin (NEURONTIN) 400 MG capsule Take 400 mg by mouth 3 (three) times daily. Nerve pain   Yes Historical Provider, MD  Ipratropium-Albuterol (COMBIVENT RESPIMAT) 20-100 MCG/ACT AERS respimat Inhale 2 puffs into the lungs daily as needed for wheezing or shortness of breath.    Yes  Historical Provider, MD  KLOR-CON M20 20 MEQ tablet Take 20 mEq by mouth every morning. with food 01/04/15  Yes Historical Provider, MD  loratadine (CLARITIN) 10 MG  tablet Take 10 mg by mouth every morning.    Yes Historical Provider, MD  magnesium hydroxide (MILK OF MAGNESIA) 400 MG/5ML suspension Take 5 mLs by mouth daily as needed for mild constipation. 03/24/15  Yes Costin Karlyne Greenspan, MD  Menthol-Methyl Salicylate (MUSCLE RUB) 10-15 % CREA Apply 1 application topically daily as needed for muscle pain. Left shoulder pain   Yes Historical Provider, MD  metFORMIN (GLUCOPHAGE) 500 MG tablet Take 1 tablet by mouth 2 (two) times daily. 03/30/15  Yes Historical Provider, MD  metoCLOPramide (REGLAN) 5 MG tablet Take 1 tablet (5 mg total) by mouth 3 (three) times daily before meals. 03/12/15  Yes Oswald Hillock, MD  metoprolol tartrate (LOPRESSOR) 25 MG tablet Take 1 tablet by mouth daily. 04/27/15  Yes Historical Provider, MD  omeprazole (PRILOSEC) 20 MG capsule Take 1 capsule (20 mg total) by mouth 2 (two) times daily before a meal. 03/26/15  Yes Tyler Pita, MD  ondansetron (ZOFRAN-ODT) 8 MG disintegrating tablet Take 1 tablet (8 mg total) by mouth every 6 (six) hours as needed for nausea or vomiting. 04/28/15  Yes Truitt Merle, MD  OXcarbazepine (TRILEPTAL) 150 MG tablet Take 150 mg by mouth 3 (three) times daily.  11/24/13  Yes Historical Provider, MD  OxyCODONE (OXYCONTIN) 20 mg T12A 12 hr tablet Take 1 tablet (20 mg total) by mouth every 12 (twelve) hours. 04/28/15  Yes Truitt Merle, MD  Vitamin D, Ergocalciferol, (DRISDOL) 50000 UNITS CAPS capsule Take 50,000 Units by mouth every Monday.    Yes Historical Provider, MD  dicyclomine (BENTYL) 20 MG tablet Take 20 mg by mouth 3 (three) times daily with meals.    Historical Provider, MD  megestrol (MEGACE) 40 MG/ML suspension Take 10 mLs (400 mg total) by mouth daily. Patient not taking: Reported on 04/15/2015 04/06/15   Hosie Poisson, MD  olmesartan (BENICAR) 40 MG tablet  Take 1 tablet (40 mg total) by mouth daily. Patient not taking: Reported on 04/28/2015 03/12/15   Oswald Hillock, MD  OxyCODONE 30 MG T12A Take 30 mg by mouth every 12 (twelve) hours. Patient not taking: Reported on 04/14/2015 04/06/15   Hosie Poisson, MD  Oxycodone HCl 10 MG TABS Take 1-2 tablet (10 mg total) by mouth every 4 (four) hours as needed for moderate pain. Patient not taking: Reported on 04/28/2015 04/28/15   Truitt Merle, MD  senna-docusate (SENOKOT-S) 8.6-50 MG per tablet Take 2 tablets by mouth 2 (two) times daily. Patient not taking: Reported on 04/28/2015 03/12/15   Oswald Hillock, MD  simvastatin (ZOCOR) 20 MG tablet Take 20 mg by mouth at bedtime.     Historical Provider, MD   Allergies  Allergen Reactions  . Haldol [Haloperidol Decanoate] Other (See Comments)    tongue swelling  . Acetaminophen Other (See Comments)    Stomach pain  . Ibuprofen Other (See Comments)    Stomach pain  . Metronidazole Other (See Comments)    Severe stomach upset--was instructed to avoid Flagyl.  . Morphine And Related Hives    Tolerates hydromorphone.  Marland Kitchen Penicillins Rash   CBC:    Component Value Date/Time   WBC 7.4 05/02/2015 0359   WBC 6.7 04/28/2015 1305   HGB 7.9* 05/02/2015 0359   HGB 8.8* 04/28/2015 1305   HCT 24.5* 05/02/2015 0359   HCT 27.7* 04/28/2015 1305   PLT 62* 05/02/2015 0359   PLT 74* 04/28/2015 1305   MCV 89.7 05/02/2015 0359   MCV 90.2 04/28/2015 1305   NEUTROABS 7.5  04/30/2015 0045   NEUTROABS 5.2 04/28/2015 1305   LYMPHSABS 1.1 04/30/2015 0045   LYMPHSABS 1.0 04/28/2015 1305   MONOABS 0.6 04/30/2015 0045   MONOABS 0.4 04/28/2015 1305   EOSABS 0.0 04/30/2015 0045   EOSABS 0.1 04/28/2015 1305   BASOSABS 0.1 04/30/2015 0045   BASOSABS 0.0 04/28/2015 1305   Comprehensive Metabolic Panel:    Component Value Date/Time   NA 142 05/02/2015 0359   NA 141 04/28/2015 1305   K 4.2 05/02/2015 0359   K 3.7 04/28/2015 1305   CL 112* 05/02/2015 0359   CO2 24 05/02/2015 0359    CO2 25 04/28/2015 1305   BUN 14 05/02/2015 0359   BUN 14.5 04/28/2015 1305   CREATININE 0.44 05/02/2015 0359   CREATININE 0.7 04/28/2015 1305   GLUCOSE 99 05/02/2015 0359   GLUCOSE 116 04/28/2015 1305   CALCIUM 8.9 05/02/2015 0359   CALCIUM 8.6 04/28/2015 1305   AST 16 05/01/2015 0530   AST 13 04/28/2015 1305   ALT 9* 05/01/2015 0530   ALT <6 04/28/2015 1305   ALKPHOS 437* 05/01/2015 0530   ALKPHOS 457* 04/28/2015 1305   BILITOT 0.8 05/01/2015 0530   BILITOT 0.46 04/28/2015 1305   PROT 5.9* 05/01/2015 0530   PROT 6.0* 04/28/2015 1305   ALBUMIN 3.1* 05/01/2015 0530   ALBUMIN 3.0* 04/28/2015 1305    Physical Exam: Vital Signs: BP 169/92 mmHg  Pulse 113  Temp(Src) 98.4 F (36.9 C) (Oral)  Resp 20  Ht 5' 4"  (1.626 m)  Wt 55.929 kg (123 lb 4.8 oz)  BMI 21.15 kg/m2  SpO2 100% SpO2: SpO2: 100 % O2 Device: O2 Device: Not Delivered O2 Flow Rate:   Intake/output summary:  Intake/Output Summary (Last 24 hours) at 05/03/15 1057 Last data filed at 05/03/15 0659  Gross per 24 hour  Intake   2545 ml  Output   2500 ml  Net     45 ml   LBM: Last BM Date: 04/28/15 Baseline Weight: Weight: 55.929 kg (123 lb 4.8 oz) Most recent weight: Weight: 55.929 kg (123 lb 4.8 oz)  Exam Findings:  GEN: alert, NAD HEENT: [place], sclera anicteric, mmm CV: tachy LUNGS: CTAB ABD: soft, mild tenderness mainly towards LLQ EXT: warm MSK: no focal midline back tenderness         Palliative Performance Scale: 50-60              Additional Data Reviewed: Recent Labs     05/01/15  0530  05/02/15  0359  WBC  6.6  7.4  HGB  8.1*  7.9*  PLT  57*  62*  NA  142  142  BUN  18  14  CREATININE  0.47  0.44     Time Total: 90 minutes Greater than 50%  of this time was spent counseling and coordinating care related to the above assessment and plan.  Signed by: Isaac Laud, DO  Doran Clay, DO  05/03/2015, 10:57 AM  Please contact Palliative Medicine Team phone at [phone] for  questions and concerns.

## 2015-05-03 NOTE — Progress Notes (Signed)
[facility] PROGRESS NOTE  [name] [name] NBV:[id number] DOB: [birth year] DOA: 04/29/2015 PCP: Harvie Junior, MD  Assessment/Plan: Abdominal pain:  - Suspect chronic severe abdominal pain that is likely due to metastasized lung cancer with known bone mets. Abdomen/pelvis showed diffuse metastasized disease that appears worse from prior studies. Lipase negative. Urinalysis is negative. -Continue Zofran for nausea -Pain initially seemed nonexistent 6/11, but now seems very poorly controlled  -discussed case with Dr. Burr Medico, recs for lumbar/thoracic MRI to r/o cord compression-no cord compression seen but diffuse osseous metastasis which are progressing are noted - Palliative care has evaluated the patient and are working on modifying medications to help better control her pain -Advancing to regular diet today  Tachycardia that is unlikely SIRS - Patient met criteria for SIRS, with elevated lactate and tachycardia, no source of infection identified, however, procalcitonin is <0.10 suggesting non-infectious pathology - Likely due to metastatic cancer and severe pain - blood culture neg x 2  Metastasized lung cancer, right upper lobe:  - Pt with stage 1 right lung cancer status post lobectomy in 2014, now with diffuse metastasis, including bone intra-abdomen and lung. -Appreciate input by Dr. Burr Medico -Continue Decadron 4 mg daily -Thoracic/lumbar MRI report reviewed-mentioned above  Anxiety:  - Cymbalta added by palliative care today -Continue Xanax.  HTN:  - Blood pressure poorly controlled, likely result of acute pain -Hydralazine IV when necessary -Pain control as above -Metoprolol scheduled - Also on PRN labetalol IV for HR>115 or uncontrolled BP  Seizure:  - continued oxcabazepine - Stable Currently  DM-II:  - Last A1c 6.1 on 03/08/15, well controled.  - Patient on metformin at home - Cont on SSI  Protein-calorie malnutrition, severe: - Ensure - Nutrition  following  COPD:  - Stable. No signs of acute exacerbation. -DuoNeb PRN  GERD: - Protonix  Hypokalemia:  - K= 2.9 on admission. - Replaced  Hypomagnesemia - Replaced  DVT ppx:  - SCD  Code Status: Full Family Communication: Pt in room Disposition Plan: Pending   Consultants:  Oncology  Palliative Care  Procedures:    Antibiotics:    HPI/Subjective: Feels that pain is better controlled. Agreeable to have Foley catheter out and diet advanced to regular diet.  strength is improving and she was able to walk more yesterday. Looking forward to walking again today. Objective: Filed Vitals:   05/02/15 1433 05/02/15 1518 05/02/15 2138 05/03/15 0506  BP: 169/111 145/80 122/85 169/92  Pulse: 144 113 112 113  Temp: 99.2 F (37.3 C)  98.1 F (36.7 C) 98.4 F (36.9 C)  TempSrc: Oral  Oral Oral  Resp: 16  20 20   Height:      Weight:      SpO2: 100%  100% 100%    Intake/Output Summary (Last 24 hours) at 05/03/15 1316 Last data filed at 05/03/15 0659  Gross per 24 hour  Intake   2545 ml  Output   1550 ml  Net    995 ml   Filed Weights   04/30/15 0900  Weight: 55.929 kg (123 lb 4.8 oz)    Exam:   General:  Awake, laying in bed, appears to be in marked pain  Cardiovascular: regular, s1, s2  Respiratory: normal resp effort, no wheezing  Abdomen: soft,nondistended, generally tender  Musculoskeletal: perfused, no clubbing, no cyanosis  Data Reviewed: Basic Metabolic Panel:  Recent Labs Lab 04/28/15 1305 04/28/15 2327 04/30/15 0045 04/30/15 0915 05/01/15 0530 05/02/15 0359  NA 141 138 137  --  142 142  K 3.7 3.7 2.9*  --  3.3* 4.2  CL  --  105 100*  --  111 112*  CO2 25 24 21*  --  23 24  GLUCOSE 116 113* 203*  --  104* 99  BUN 14.5 13 16   --  18 14  CREATININE 0.7 0.54 0.58  --  0.47 0.44  CALCIUM 8.6 8.7* 9.5  --  8.8* 8.9  MG  --   --   --  1.7  --   --    Liver Function Tests:  Recent Labs Lab 04/28/15 1305 04/28/15 2327  04/30/15 0045 05/01/15 0530  AST 13 18 25 16   ALT <6 10* 12* 9*  ALKPHOS 457* 464* 558* 437*  BILITOT 0.46 0.7 1.4* 0.8  PROT 6.0* 6.4* 7.6 5.9*  ALBUMIN 3.0* 3.2* 3.8 3.1*    Recent Labs Lab 04/30/15 0045  LIPASE 11*   No results for input(s): AMMONIA in the last 168 hours. CBC:  Recent Labs Lab 04/28/15 1305 04/28/15 2327 04/30/15 0045 05/01/15 0530 05/02/15 0359  WBC 6.7 7.9 9.3 6.6 7.4  NEUTROABS 5.2 6.2 7.5  --   --   HGB 8.8* 9.0* 11.0* 8.1* 7.9*  HCT 27.7* 28.4* 33.0* 24.7* 24.5*  MCV 90.2 89.0 87.1 88.8 89.7  PLT 74* 69* 73* 57* 62*   Cardiac Enzymes: No results for input(s): CKTOTAL, CKMB, CKMBINDEX, TROPONINI in the last 168 hours. BNP (last 3 results) No results for input(s): BNP in the last 8760 hours.  ProBNP (last 3 results) No results for input(s): PROBNP in the last 8760 hours.  CBG:  Recent Labs Lab 05/02/15 1158 05/02/15 1639 05/02/15 2301 05/03/15 0758 05/03/15 1152  GLUCAP 129* 147* 136* 117* 145*    Recent Results (from the past 240 hour(s))  TECHNOLOGIST REVIEW     Status: None   Collection Time: 04/28/15  1:05 PM  Result Value Ref Range Status   Technologist Review Metas and Myelocytes present. 4% nrbcs  Final  Culture, blood (x 2)     Status: None (Preliminary result)   Collection Time: 04/30/15  8:40 AM  Result Value Ref Range Status   Specimen Description BLOOD LEFT FINGER  Final   Special Requests BOTTLES DRAWN AEROBIC ONLY 0.5 CC  Final   Culture   Final           BLOOD CULTURE RECEIVED NO GROWTH TO DATE CULTURE WILL BE HELD FOR 5 DAYS BEFORE ISSUING A FINAL NEGATIVE REPORT Note: Culture results may be compromised due to an inadequate volume of blood received in culture bottles. Performed at [facility]    Report Status PENDING  Incomplete  Culture, blood (x 2)     Status: None (Preliminary result)   Collection Time: 04/30/15 11:15 AM  Result Value Ref Range Status   Specimen Description BLOOD RIGHT ARM   Final   Special Requests BOTTLES DRAWN AEROBIC ONLY 10 CC BLUE  Final   Culture   Final           BLOOD CULTURE RECEIVED NO GROWTH TO DATE CULTURE WILL BE HELD FOR 5 DAYS BEFORE ISSUING A FINAL NEGATIVE REPORT Performed at [facility]    Report Status PENDING  Incomplete     Studies: Mr Thoracic Spine Wo Contrast  05/03/2015   CLINICAL DATA:  Back and abdominal pain. Lung cancer with bone metastases.  EXAM: MRI THORACIC AND LUMBAR SPINE WITHOUT CONTRAST  TECHNIQUE: Multiplanar and multiecho pulse sequences of the thoracic and lumbar  spine were obtained without intravenous contrast.  COMPARISON:  PET-CT 04/01/2015.  Lumbar spine MRI 02/02/2015.  FINDINGS: MR THORACIC SPINE FINDINGS  Examination was ordered without IV contrast. Additionally, the patient could not tolerate any additional imaging due to pain.  Limited imaging of the cervical spine for localization purposes demonstrates sequelae of prior C4-C6 ACDF. Vertebral bone marrow signal is diffusely diminished on T1 weighted images and diffusely heterogeneous on T2 weighted images consistent with known widespread osseous metastases. Vertebral body heights are preserved without evidence of compression fracture. There is slight anterolisthesis of C7 on T1. There is mild thoracic dextroscoliosis. The thoracic spinal cord is normal in caliber and signal.  Slight bulging of the posterior T12 vertebral body is noted at the site of subtle ventral epidural tumor described on the prior lumbar spine MRI, better demonstrated on the prior study given the lack of axial imaging through the mid vertebral body level on the current examination. No epidural tumor is identified elsewhere on this unenhanced examination. No thoracic disc herniation, spinal stenosis, or neural foraminal stenosis is identified. Paraspinal soft tissues are unremarkable.  MR LUMBAR SPINE FINDINGS  Trace retrolisthesis of L2 on L3 and grade 1 anterolisthesis of L5 on S1 are unchanged.  Vertebral body heights are preserved without evidence of compression fracture. Sequelae of prior L5-S1 posterior and interbody fusion are again identified with unilateral left-sided pedicle screws and an interbody spacer in place.  Bone marrow signal is diffusely abnormal, more so than on the prior MRI, now with complete replacement of the normal fatty marrow signal on T1 weighted images which may reflect a combination of worsening osseous metastatic disease as well as treatment related bone marrow changes. Vertebral bone marrow signal is diffusely heterogeneous on T2 weighted images with regions of both increased and decreased signal. A discrete rounded T2/STIR hyperintense lesion in the right aspect of the L5 vertebral body has increased in size, measuring 1.7 cm (previously 1.0 cm). There is diffuse involvement of the visualized sacrum and ilia. No epidural tumor is identified in the lumbar spine on this unenhanced examination.  Disc desiccation and mild-to-moderate disc space narrowing are again seen at L2-3 and L3-4. The Schmorl's node at L3-4 is unchanged. Conus medullaris is normal in signal and terminates at L1. Mild disc bulging and mild-to-moderate facet hypertrophy throughout the mid and lower lumbar spine do not appear significantly changed. There is no spinal stenosis. Paraspinal soft tissues are unremarkable.  IMPRESSION: 1. Diffuse osseous metastases, progressed from 02/02/2015. 2. Subtle ventral epidural tumor at T12 as seen on prior MRI. No substantial epidural tumor or cord compression. 3. No thoracic or lumbar spinal stenosis. Stable degenerative and postoperative changes in the lumbar spine.   Electronically Signed   By: Logan Bores   On: 05/03/2015 08:23   Mr Lumbar Spine Wo Contrast  05/03/2015   CLINICAL DATA:  Back and abdominal pain. Lung cancer with bone metastases.  EXAM: MRI THORACIC AND LUMBAR SPINE WITHOUT CONTRAST  TECHNIQUE: Multiplanar and multiecho pulse sequences of the  thoracic and lumbar spine were obtained without intravenous contrast.  COMPARISON:  PET-CT 04/01/2015.  Lumbar spine MRI 02/02/2015.  FINDINGS: MR THORACIC SPINE FINDINGS  Examination was ordered without IV contrast. Additionally, the patient could not tolerate any additional imaging due to pain.  Limited imaging of the cervical spine for localization purposes demonstrates sequelae of prior C4-C6 ACDF. Vertebral bone marrow signal is diffusely diminished on T1 weighted images and diffusely heterogeneous on T2 weighted images consistent with known  widespread osseous metastases. Vertebral body heights are preserved without evidence of compression fracture. There is slight anterolisthesis of C7 on T1. There is mild thoracic dextroscoliosis. The thoracic spinal cord is normal in caliber and signal.  Slight bulging of the posterior T12 vertebral body is noted at the site of subtle ventral epidural tumor described on the prior lumbar spine MRI, better demonstrated on the prior study given the lack of axial imaging through the mid vertebral body level on the current examination. No epidural tumor is identified elsewhere on this unenhanced examination. No thoracic disc herniation, spinal stenosis, or neural foraminal stenosis is identified. Paraspinal soft tissues are unremarkable.  MR LUMBAR SPINE FINDINGS  Trace retrolisthesis of L2 on L3 and grade 1 anterolisthesis of L5 on S1 are unchanged. Vertebral body heights are preserved without evidence of compression fracture. Sequelae of prior L5-S1 posterior and interbody fusion are again identified with unilateral left-sided pedicle screws and an interbody spacer in place.  Bone marrow signal is diffusely abnormal, more so than on the prior MRI, now with complete replacement of the normal fatty marrow signal on T1 weighted images which may reflect a combination of worsening osseous metastatic disease as well as treatment related bone marrow changes. Vertebral bone marrow  signal is diffusely heterogeneous on T2 weighted images with regions of both increased and decreased signal. A discrete rounded T2/STIR hyperintense lesion in the right aspect of the L5 vertebral body has increased in size, measuring 1.7 cm (previously 1.0 cm). There is diffuse involvement of the visualized sacrum and ilia. No epidural tumor is identified in the lumbar spine on this unenhanced examination.  Disc desiccation and mild-to-moderate disc space narrowing are again seen at L2-3 and L3-4. The Schmorl's node at L3-4 is unchanged. Conus medullaris is normal in signal and terminates at L1. Mild disc bulging and mild-to-moderate facet hypertrophy throughout the mid and lower lumbar spine do not appear significantly changed. There is no spinal stenosis. Paraspinal soft tissues are unremarkable.  IMPRESSION: 1. Diffuse osseous metastases, progressed from 02/02/2015. 2. Subtle ventral epidural tumor at T12 as seen on prior MRI. No substantial epidural tumor or cord compression. 3. No thoracic or lumbar spinal stenosis. Stable degenerative and postoperative changes in the lumbar spine.   Electronically Signed   By: Logan Bores   On: 05/03/2015 08:23    Scheduled Meds: . ALPRAZolam  0.5 mg Oral BID  . cholecalciferol  1,000 Units Oral Daily  . dexamethasone  4 mg Oral Daily  . dicyclomine  20 mg Oral TID WC  . DULoxetine  30 mg Oral Daily  . feeding supplement (ENSURE ENLIVE)  237 mL Oral BID BM  . feeding supplement (RESOURCE BREEZE)  1 Container Oral TID BM  . fluconazole  100 mg Oral Daily  . folic acid  1 mg Oral Daily  . gabapentin  400 mg Oral TID  . insulin aspart  0-9 Units Subcutaneous TID WC  . irbesartan  300 mg Oral Daily  . loratadine  10 mg Oral q morning - 10a  . metoCLOPramide  5 mg Oral TID AC  . metoprolol tartrate  25 mg Oral Daily  . nicotine  21 mg Transdermal Daily  . OXcarbazepine  150 mg Oral TID  . OxyCODONE  40 mg Oral 3 times per day  . pantoprazole  40 mg Oral Daily   . polyethylene glycol  17 g Oral Daily  . senna-docusate  2 tablet Oral BID  . simvastatin  20 mg Oral  QHS  . sodium chloride  3 mL Intravenous Q12H   Continuous Infusions: . sodium chloride 100 mL/hr at 05/03/15 0327    Principal Problem:   Abdominal pain Active Problems:   Lung cancer, right upper lobe   Anxiety disorder   Bipolar 1 disorder   Hepatitis C   Hepatitis B   Hypertension   Diabetes mellitus   Spinal stenosis of lumbar region L L%-S1   Metastatic primary lung cancer   Osseous metastasis   Metastatic cancer to bone   Depression   Palliative care encounter   Generalized anxiety disorder   Protein-calorie malnutrition, severe   AP (abdominal pain)   SIRS (systemic inflammatory response syndrome)   Hypokalemia   GERD (gastroesophageal reflux disease)   Malnutrition of moderate degree   Chronic pain   Lactic acidosis   Neuropathic pain   Constipation due to opioid therapy   Anxiety and depression   [facility]  [facility] Pager [phone]. If 7PM-7AM, please contact night-coverage at [email or web address], password [facility] 05/03/2015, 1:16 PM  LOS: 3 days

## 2015-05-03 NOTE — Plan of Care (Signed)
Problem: Phase I Progression Outcomes Goal: Hemodynamically stable Outcome: Progressing BP and HR run high at times still.

## 2015-05-03 NOTE — Progress Notes (Signed)
Occupational Therapy Treatment Patient Details Name: [name] [name] MRN: [id number] DOB: Sep 12, 1951 Today's Date: 05/03/2015    History of present illness 64 year old female with a past medical history of adenocarcinoma of right lung status post lobectomy having widespread bony metastatic disease, COPD, seizure, HCV and HBV, DM, chronic back, shoulder, and neck pain, bipolar disorder who was admitted 04/29/2015 with complaints of abdominal pain likely due to metastasized lung cancer with known bone mets   OT comments  Pt performing basic ADL tasks at supervision level with 8/10 pain reported in back and pelvis. Pt would like a shower seat which would be beneficial for safety with showering. She has a toilet riser. Pt plans to purchase reacher in gift shop and practiced with using reacher for retrieving items to help with minimizing bending/back pain. All goals met for OT. Will d/c OT. Feel pt is ok to get up with nursing for ADL.   Follow Up Recommendations  No OT follow up;Supervision - Intermittent    Equipment Recommendations  Tub/shower seat    Recommendations for Other Services      Precautions / Restrictions Precautions Precautions: None       Mobility Bed Mobility Overal bed mobility: Modified Independent                Transfers Overall transfer level: Modified independent                    Balance                                   ADL               Lower Body Bathing: Supervison/ safety;Sit to/from stand       Lower Body Dressing: Supervision/safety;Sit to/from stand   Toilet Transfer: Supervision/safety;Ambulation;BSC       Tub/ Shower Transfer: Walk-in shower;Supervision/safety;Grab bars     General ADL Comments: Pt stood and used reacher to retrieve items off the floor as well as open drawer and retrieve items out of drawer with supervision. Pt mobiling in room for functional tasks at supervision level without  assistive device. educated on uses of reacher for minimizing bending and help with pain control. Pt plans to obtain reacher in gift shop. Pt stepped in and out of shower with grab bar and does have bar at home. She would like a shower chair.       Vision                     Perception     Praxis      Cognition   Behavior During Therapy: WFL for tasks assessed/performed Overall Cognitive Status: Within Functional Limits for tasks assessed                       Extremity/Trunk Assessment               Exercises     Shoulder Instructions       General Comments      Pertinent Vitals/ Pain       Pain Assessment: 0-10 Pain Score: 8  Pain Location: back and pelvis. Pain Descriptors / Indicators: Sore Pain Intervention(s): Repositioned;Monitored during session  Home Living  Prior Functioning/Environment              Frequency       Progress Toward Goals  OT Goals(current goals can now be found in the care plan section)  Progress towards OT goals: Goals met/education completed, patient discharged from [place] All goals met and education completed, patient discharged from OT services    Co-evaluation                 End of Session     Activity Tolerance Patient tolerated treatment well   Patient Left in bed;with call bell/phone within reach   Nurse Communication          Time: 0925-0950 OT Time Calculation (min): 25 min  Charges: OT General Charges $OT Visit: 1 Procedure OT Treatments $Self Care/Home Management : 8-22 mins $Therapeutic Activity: 8-22 mins  Jules Schick  [phone] 05/03/2015, 10:03 AM

## 2015-05-03 NOTE — Telephone Encounter (Signed)
NOTED IN CHART.

## 2015-05-04 DIAGNOSIS — K5900 Constipation, unspecified: Secondary | ICD-10-CM

## 2015-05-04 LAB — GLUCOSE, CAPILLARY
GLUCOSE-CAPILLARY: 100 mg/dL — AB (ref 65–99)
Glucose-Capillary: 105 mg/dL — ABNORMAL HIGH (ref 65–99)
Glucose-Capillary: 162 mg/dL — ABNORMAL HIGH (ref 65–99)
Glucose-Capillary: 194 mg/dL — ABNORMAL HIGH (ref 65–99)

## 2015-05-04 MED ORDER — HYDROMORPHONE HCL 1 MG/ML IJ SOLN
0.5000 mg | INTRAMUSCULAR | Status: DC | PRN
  Administered 2015-05-04 – 2015-05-05 (×4): 0.5 mg via INTRAVENOUS
  Filled 2015-05-04 (×4): qty 1

## 2015-05-04 MED ORDER — HYDROMORPHONE HCL 1 MG/ML IJ SOLN
1.0000 mg | INTRAMUSCULAR | Status: DC | PRN
  Administered 2015-05-04: 1 mg via INTRAVENOUS
  Filled 2015-05-04: qty 1

## 2015-05-04 MED ORDER — VITAMINS A & D EX OINT
TOPICAL_OINTMENT | CUTANEOUS | Status: AC
  Administered 2015-05-04: 5
  Filled 2015-05-04: qty 5

## 2015-05-04 MED ORDER — OXYCODONE HCL 5 MG PO TABS
20.0000 mg | ORAL_TABLET | ORAL | Status: DC | PRN
  Administered 2015-05-04 – 2015-05-05 (×3): 20 mg via ORAL
  Filled 2015-05-04 (×4): qty 4

## 2015-05-04 MED ORDER — LINACLOTIDE 145 MCG PO CAPS
145.0000 ug | ORAL_CAPSULE | Freq: Every day | ORAL | Status: DC
  Administered 2015-05-04 – 2015-05-07 (×4): 145 ug via ORAL
  Filled 2015-05-04 (×4): qty 1

## 2015-05-04 NOTE — Progress Notes (Signed)
Daily Progress Note   Patient Name: [name] [name]       Date: 05/04/2015 DOB: 10/29/1951  Age: 64 y.o. MRN#: [id number] Attending Physician: Donne Hazel, MD Primary Care Physician: Harvie Junior, MD Admit Date: 04/29/2015  Reason for Consultation/Follow-up: Establishing goals of care and Pain control  Subjective: Pain fair today. Feels like control is up and down. Still reports as severe at time. Moved her bowels some yesterday.  Appetite good.   Length of Stay: 4 days  Current Medications: Scheduled Meds:  . ALPRAZolam  0.5 mg Oral BID  . cholecalciferol  1,000 Units Oral Daily  . dexamethasone  4 mg Oral Daily  . dicyclomine  20 mg Oral TID WC  . DULoxetine  30 mg Oral Daily  . feeding supplement (ENSURE ENLIVE)  237 mL Oral BID BM  . feeding supplement (RESOURCE BREEZE)  1 Container Oral TID BM  . fluconazole  100 mg Oral Daily  . folic acid  1 mg Oral Daily  . gabapentin  400 mg Oral TID  . insulin aspart  0-9 Units Subcutaneous TID WC  . irbesartan  300 mg Oral Daily  . Linaclotide  145 mcg Oral Daily  . loratadine  10 mg Oral q morning - 10a  . metoCLOPramide  5 mg Oral TID AC  . metoprolol tartrate  25 mg Oral Daily  . nicotine  21 mg Transdermal Daily  . OXcarbazepine  150 mg Oral TID  . OxyCODONE  40 mg Oral 3 times per day  . pantoprazole  40 mg Oral Daily  . polyethylene glycol  17 g Oral Daily  . senna-docusate  2 tablet Oral BID  . simvastatin  20 mg Oral QHS  . sodium chloride  3 mL Intravenous Q12H    Continuous Infusions: . sodium chloride 100 mL/hr at 05/04/15 1056    PRN Meds: bisacodyl, diphenhydrAMINE, hydrALAZINE, HYDROmorphone (DILAUDID) injection, ipratropium-albuterol, labetalol, magnesium hydroxide, MUSCLE RUB, ondansetron, oxyCODONE, promethazine, sodium chloride    Vital Signs: BP 147/76 mmHg  Pulse 84  Temp(Src) 98.4 F (36.9 C) (Oral)  Resp 16  Ht '5\' 4"'$  (1.626 m)  Wt 55.929 kg (123 lb 4.8 oz)  BMI 21.15 kg/m2  SpO2  99% SpO2: SpO2: 99 % O2 Device: O2 Device: Not Delivered O2 Flow Rate:    Intake/output summary:  Intake/Output Summary (Last 24 hours) at 05/04/15 1355 Last data filed at 05/04/15 0653  Gross per 24 hour  Intake   2390 ml  Output      0 ml  Net   2390 ml   Baseline Weight: Weight: 55.929 kg (123 lb 4.8 oz) Most recent weight: Weight: 55.929 kg (123 lb 4.8 oz)  Physical Exam: GEN: alert, NAD HEENT: [place], sclera anicteric CV: RRR ABD: soft, ND EXT: warm MSK: no midline spine tenderness, left flank tenderness             Additional Data Reviewed: Recent Labs     05/02/15  0359  WBC  7.4  HGB  7.9*  PLT  62*  NA  142  BUN  14  CREATININE  0.44     Problem List:  Patient Active Problem List   Diagnosis Date Noted  . Neuropathic pain   . Constipation due to opioid therapy   . Anxiety and depression   . Lactic acidosis   . SIRS (systemic inflammatory response syndrome) 04/30/2015  . Malnutrition of moderate degree 04/30/2015  . Dehydration   . Hypokalemia   .  GERD (gastroesophageal reflux disease)   . Chronic pain   . AP (abdominal pain)   . Protein-calorie malnutrition, severe 04/04/2015  . Nausea and vomiting 04/02/2015  . Nausea & vomiting 04/02/2015  . Left lateral abdominal pain 04/02/2015  . DM gastroparesis 04/02/2015  . Generalized anxiety disorder 04/02/2015  . Anxiety state   . Palliative care encounter   . Depression   . CN (constipation)   . Metastatic cancer to bone   . Metastatic adenocarcinoma 03/08/2015  . Nausea with vomiting 03/08/2015  . Cancer associated pain 03/08/2015  . Osseous metastasis 02/17/2015  . Uncontrolled pain 02/17/2015  . Metastatic primary lung cancer 02/11/2015  . Radiculopathy 03/05/2014  . Intermittent diarrhea 02/07/2014  . Spinal stenosis of lumbar region L L%-S1 08/10/2013  . Other and unspecified noninfectious gastroenteritis and colitis(558.9) 05/27/2013  . Abdominal pain 05/26/2013  . Hepatitis C 05/26/2013   . Hepatitis B 05/26/2013  . Hypertension 05/26/2013  . Diabetes mellitus 05/26/2013  . Nausea vomiting and diarrhea 03/08/2013  . Abdominal pain, epigastric 03/08/2013  . Enteritis due to Clostridium difficile 01/02/2013  . Tachycardia 12/30/2012  . Bipolar 1 disorder 12/29/2012  . S/P thoracotomy 12/10/2012  . Bipolar disorder 12/08/2012  . Anxiety disorder 12/08/2012  . Lung cancer, right upper lobe 11/07/2012     Palliative Care Assessment & Plan  64 yo female with PMHx of stage IV NSCLC admitted with abdominal pain, N/V  Code Status:  Full code  Goals of Care:  See initial consult. Focus today was on symptom management and developing strategies to control pain and wean off IV. I think her buy in to any medication plans are key to [place].    3. Symptom Management:  Cancer related pain- unfortunately received IV doses and oral doses at same time yesterday.  She feels like pain control is still struggle.  Talked a lot today about expectations and importance of finding regimen off IV medicines.  I still think that a lot of her psychosocial situation and depression drives pain for her.  I do have to be somewhat cautious about pseudo addiction and myself under treating pain as well. However we have made significant increase in her pain medications and I think are making progress in pain control.  I will increase her oxycodone to '20mg'$  PRN and decrease IV dilaudid to 0.'5mg'$  q4h PRN.  Keep oxycontin dosing.  She agrees with this plan.  Next step would be to increase long acting medication.  I am still unable to illicit focal midline tenderness so not sure radiation would really palliate pain.   Constipation- moved bowels last night. Continue regimen.   Neuropathic pain- related to DM and perhaps from spinal disease.  cymbalta. Continue neurontin  Anxiety/Depression- on BID xanax. Cautious with bezo's and high dose opioids. Added cymbalta as above  N/V- resolved  4. Prognosis:  Unable to determine  5. Discharge Planning: Home with [place] was discussed with patient, RN, Dr Wyline Copas  Thank you for allowing the Palliative Medicine Team to assist in the care of this patient.   Total Time: 30 minutes Greater than 50%  of this time was spent counseling and coordinating care related to the above assessment and plan.   Doran Clay, DO  05/04/2015, 1:55 PM  Please contact Palliative Medicine Team phone at [phone] for questions and concerns.

## 2015-05-04 NOTE — Care Management Note (Signed)
Case Management Note  Patient Details  Name: [name] [name] MRN: [id number] Date of Birth: 07/31/1951  Subjective/Objective:                    Action/Plan:d/c plan home.No anticipated d/c needs.   Expected Discharge Date:                  Expected Discharge Plan:  Home/Self Care  In-House Referral:     Discharge planning Services     Post Acute Care Choice:    Choice offered to:     DME Arranged:    DME Agency:     HH Arranged:    HH Agency:     Status of Service:  In process, will continue to follow  Medicare Important Message Given:  Yes Date Medicare IM Given:  05/03/15 Medicare IM give by:  Dessa Phi Date Additional Medicare IM Given:    Additional Medicare Important Message give by:     If discussed at [place] of Stay Meetings, dates discussed: 05/04/15   Additional Comments:  Dessa Phi, RN 05/04/2015, 11:58 AM

## 2015-05-04 NOTE — Progress Notes (Signed)
[facility] PROGRESS NOTE  [name] [name] XKG:[id number] DOB: [birth year] DOA: 04/29/2015 PCP: Harvie Junior, MD  Assessment/Plan: Abdominal pain:  - Suspect chronic severe abdominal pain that is likely due to metastasized lung cancer with known bone mets. Abdomen/pelvis showed diffuse metastasized disease that appears worse from prior studies. Lipase negative. Urinalysis is negative. -Continue Zofran for nausea -Pain initially seemed nonexistent 6/11, but now seems very poorly controlled  -Discussed case with Dr. Burr Medico, recs for lumbar/thoracic MRI to r/o cord compression-no cord compression seen but diffuse osseous metastasis which are progressing are noted - Palliative care assisting with chronic pain regimen. Recommendations noted. Appreciate input. - Agree with weaning back IV narcotics. 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 -Appreciate input by Dr. Burr Medico -Continue Decadron 4 mg daily -Thoracic/lumbar MRI report reviewed-mentioned above  Anxiety:  - Cymbalta added by palliative care today -Continue Xanax.  HTN:  - Blood pressure poorly controlled, likely result of acute pain -Hydralazine IV when necessary -Pain control as above -Metoprolol scheduled - Also on PRN labetalol IV for HR>115 or uncontrolled BP  Seizure:  - continued oxcabazepine - Stable Currently  DM-II:  - Last A1c 6.1 on 03/08/15, well controled.  - Patient on metformin at home - Cont on  SSI  Protein-calorie malnutrition, severe: - Ensure - Nutrition following  COPD:  - Stable. No signs of acute exacerbation. -DuoNeb PRN  GERD: - Protonix  Hypokalemia:  - K= 2.9 on admission. - Replaced and corrected  Hypomagnesemia - Replaced  DVT ppx:  - SCD  Constipation - Given narcotic use, will give trial of linzess  Code Status: Full Family Communication: Pt in room Disposition Plan: Pending   Consultants:  Oncology  Palliative Care  Procedures:    Antibiotics:    HPI/Subjective: States pain is improved, although pt continues to inquire about continuing IV narcotics.  Objective: Filed Vitals:   05/03/15 1336 05/03/15 2125 05/04/15 0457 05/04/15 1350  BP: 152/86 137/68 158/88 147/76  Pulse: 100 100 104 84  Temp: 98.2 F (36.8 C) 98.2 F (36.8 C) 98.8 F (37.1 C) 98.4 F (36.9 C)  TempSrc: Oral Oral Oral Oral  Resp: '20 16 16 16  '$ Height:      Weight:      SpO2: 100% 100% 98% 99%    Intake/Output Summary (Last 24 hours) at 05/04/15 1613 Last data filed at 05/04/15 0653  Gross per 24 hour  Intake 1588.33 ml  Output      0 ml  Net 1588.33 ml   Filed Weights   04/30/15 0900  Weight: 55.929 kg (123 lb 4.8 oz)    Exam:   General:  Ambulatory in room, in nad  Cardiovascular: regular, s1, s2  Respiratory: normal resp effort, no wheezing  Abdomen: soft,nondistended, generally tender, decreased BS  Musculoskeletal: perfused, no clubbing, no cyanosis  Data Reviewed: Basic Metabolic Panel:  Recent Labs Lab 04/28/15 1305 04/28/15 2327 04/30/15 0045 04/30/15 0915 05/01/15 0530 05/02/15 0359  NA 141 138 137  --  142 142  K 3.7 3.7 2.9*  --  3.3* 4.2  CL  --  105 100*  --  111 112*  CO2 25 24 21*  --  23 24  GLUCOSE 116 113* 203*  --  104* 99  BUN 14.'5 13 16  '$ --  18 14  CREATININE 0.7 0.54 0.58  --  0.47 0.44  CALCIUM 8.6 8.7* 9.5  --  8.8* 8.9  MG  --   --   --  1.7  --   --    Liver Function Tests:  Recent  Labs Lab 04/28/15 1305 04/28/15 2327 04/30/15 0045 05/01/15 0530  AST '13 18 25 16  '$ ALT <6 10* 12* 9*  ALKPHOS 457* 464* 558* 437*  BILITOT 0.46 0.7 1.4* 0.8  PROT 6.0* 6.4* 7.6 5.9*  ALBUMIN 3.0* 3.2* 3.8 3.1*    Recent Labs Lab 04/30/15 0045  LIPASE 11*   No results for input(s): AMMONIA in the last 168 hours. CBC:  Recent Labs Lab 04/28/15 1305 04/28/15 2327 04/30/15 0045 05/01/15 0530 05/02/15 0359  WBC 6.7 7.9 9.3 6.6 7.4  NEUTROABS 5.2 6.2 7.5  --   --   HGB 8.8* 9.0* 11.0* 8.1* 7.9*  HCT 27.7* 28.4* 33.0* 24.7* 24.5*  MCV 90.2 89.0 87.1 88.8 89.7  PLT 74* 69* 73* 57* 62*   Cardiac Enzymes: No results for input(s): CKTOTAL, CKMB, CKMBINDEX, TROPONINI in the last 168 hours. BNP (last 3 results) No results for input(s): BNP in the last 8760 hours.  ProBNP (last 3 results) No results for input(s): PROBNP in the last 8760 hours.  CBG:  Recent Labs Lab 05/03/15 1152 05/03/15 1725 05/03/15 2121 05/04/15 0731 05/04/15 1132  GLUCAP 145* 145* 166* 105* 100*    Recent Results (from the past 240 hour(s))  TECHNOLOGIST REVIEW     Status: None   Collection Time: 04/28/15  1:05 PM  Result Value Ref Range Status   Technologist Review Metas and Myelocytes present. 4% nrbcs  Final  Culture, blood (x 2)     Status: None (Preliminary result)   Collection Time: 04/30/15  8:40 AM  Result Value Ref Range Status   Specimen Description BLOOD LEFT FINGER  Final   Special Requests BOTTLES DRAWN AEROBIC ONLY 0.5 CC  Final   Culture   Final           BLOOD CULTURE RECEIVED NO GROWTH TO DATE CULTURE WILL BE HELD FOR 5 DAYS BEFORE ISSUING A FINAL NEGATIVE REPORT Note: Culture results may be compromised due to an inadequate volume of blood received in culture bottles. Performed at [facility]    Report Status PENDING  Incomplete  Culture, blood (x 2)     Status: None (Preliminary result)   Collection Time: 04/30/15 11:15 AM  Result Value Ref Range Status    Specimen Description BLOOD RIGHT ARM  Final   Special Requests BOTTLES DRAWN AEROBIC ONLY 10 CC BLUE  Final   Culture   Final           BLOOD CULTURE RECEIVED NO GROWTH TO DATE CULTURE WILL BE HELD FOR 5 DAYS BEFORE ISSUING A FINAL NEGATIVE REPORT Performed at [facility]    Report Status PENDING  Incomplete     Studies: Mr Thoracic Spine Wo Contrast  05/03/2015   CLINICAL DATA:  Back and abdominal pain. Lung cancer with bone metastases.  EXAM: MRI THORACIC AND LUMBAR SPINE WITHOUT CONTRAST  TECHNIQUE: Multiplanar and multiecho pulse sequences of the thoracic and lumbar spine were obtained without intravenous contrast.  COMPARISON:  PET-CT 04/01/2015.  Lumbar spine MRI 02/02/2015.  FINDINGS: MR THORACIC SPINE FINDINGS  Examination was ordered without IV contrast. Additionally, the patient could not tolerate any additional imaging due to pain.  Limited imaging of the cervical spine for localization purposes demonstrates sequelae of prior C4-C6 ACDF. Vertebral bone marrow signal is diffusely diminished on T1 weighted images and diffusely heterogeneous on T2 weighted images consistent with known widespread osseous metastases. Vertebral body heights are preserved without evidence of compression fracture. There is slight anterolisthesis of C7 on T1. There is mild thoracic dextroscoliosis. The thoracic spinal cord is normal in caliber and signal.  Slight bulging of the posterior T12 vertebral body is noted at the site of subtle ventral epidural tumor described on the prior lumbar spine MRI, better demonstrated on the prior study given the lack of axial imaging through the mid vertebral body level on the current examination. No epidural tumor is identified elsewhere on this unenhanced examination. No thoracic disc herniation, spinal stenosis, or neural foraminal stenosis is identified. Paraspinal soft tissues are unremarkable.  MR LUMBAR SPINE FINDINGS  Trace retrolisthesis of L2 on L3 and grade 1  anterolisthesis of L5 on S1 are unchanged. Vertebral body heights are preserved without evidence of compression fracture. Sequelae of prior L5-S1 posterior and interbody fusion are again identified with unilateral left-sided pedicle screws and an interbody spacer in place.  Bone marrow signal is diffusely abnormal, more so than on the prior MRI, now with complete replacement of the normal fatty marrow signal on T1 weighted images which may reflect a combination of worsening osseous metastatic disease as well as treatment related bone marrow changes. Vertebral bone marrow signal is diffusely heterogeneous on T2 weighted images with regions of both increased and decreased signal. A discrete rounded T2/STIR hyperintense lesion in the right aspect of the L5 vertebral body has increased in size, measuring 1.7 cm (previously 1.0 cm). There is diffuse involvement of the visualized sacrum and ilia. No epidural tumor is identified in the lumbar spine on this unenhanced examination.  Disc desiccation and mild-to-moderate disc space narrowing are again seen at L2-3 and L3-4. The Schmorl's node at L3-4 is unchanged. Conus medullaris is normal in signal and terminates at L1. Mild disc bulging and mild-to-moderate facet hypertrophy throughout the mid and lower lumbar spine do not appear significantly changed. There is no spinal stenosis. Paraspinal soft tissues are unremarkable.  IMPRESSION: 1. Diffuse osseous metastases, progressed from 02/02/2015. 2. Subtle ventral epidural tumor at T12 as seen on prior MRI. No substantial epidural tumor or cord compression. 3. No thoracic or lumbar spinal stenosis. Stable degenerative and postoperative changes in the lumbar spine.   Electronically Signed   By: Logan Bores   On: 05/03/2015 08:23   Mr Lumbar Spine Wo Contrast  05/03/2015   CLINICAL DATA:  Back and abdominal pain. Lung cancer with bone metastases.  EXAM: MRI THORACIC AND LUMBAR SPINE WITHOUT CONTRAST  TECHNIQUE: Multiplanar  and multiecho pulse sequences of the thoracic and lumbar spine were obtained without intravenous contrast.  COMPARISON:  PET-CT 04/01/2015.  Lumbar spine MRI 02/02/2015.  FINDINGS: MR THORACIC SPINE FINDINGS  Examination was ordered without IV contrast. Additionally, the patient could not tolerate any additional imaging due to pain.  Limited imaging of the cervical spine for localization purposes demonstrates sequelae of prior C4-C6 ACDF. Vertebral bone marrow signal is diffusely diminished on T1 weighted images and diffusely heterogeneous on T2 weighted images consistent with known widespread osseous metastases. Vertebral body heights are  preserved without evidence of compression fracture. There is slight anterolisthesis of C7 on T1. There is mild thoracic dextroscoliosis. The thoracic spinal cord is normal in caliber and signal.  Slight bulging of the posterior T12 vertebral body is noted at the site of subtle ventral epidural tumor described on the prior lumbar spine MRI, better demonstrated on the prior study given the lack of axial imaging through the mid vertebral body level on the current examination. No epidural tumor is identified elsewhere on this unenhanced examination. No thoracic disc herniation, spinal stenosis, or neural foraminal stenosis is identified. Paraspinal soft tissues are unremarkable.  MR LUMBAR SPINE FINDINGS  Trace retrolisthesis of L2 on L3 and grade 1 anterolisthesis of L5 on S1 are unchanged. Vertebral body heights are preserved without evidence of compression fracture. Sequelae of prior L5-S1 posterior and interbody fusion are again identified with unilateral left-sided pedicle screws and an interbody spacer in place.  Bone marrow signal is diffusely abnormal, more so than on the prior MRI, now with complete replacement of the normal fatty marrow signal on T1 weighted images which may reflect a combination of worsening osseous metastatic disease as well as treatment related bone  marrow changes. Vertebral bone marrow signal is diffusely heterogeneous on T2 weighted images with regions of both increased and decreased signal. A discrete rounded T2/STIR hyperintense lesion in the right aspect of the L5 vertebral body has increased in size, measuring 1.7 cm (previously 1.0 cm). There is diffuse involvement of the visualized sacrum and ilia. No epidural tumor is identified in the lumbar spine on this unenhanced examination.  Disc desiccation and mild-to-moderate disc space narrowing are again seen at L2-3 and L3-4. The Schmorl's node at L3-4 is unchanged. Conus medullaris is normal in signal and terminates at L1. Mild disc bulging and mild-to-moderate facet hypertrophy throughout the mid and lower lumbar spine do not appear significantly changed. There is no spinal stenosis. Paraspinal soft tissues are unremarkable.  IMPRESSION: 1. Diffuse osseous metastases, progressed from 02/02/2015. 2. Subtle ventral epidural tumor at T12 as seen on prior MRI. No substantial epidural tumor or cord compression. 3. No thoracic or lumbar spinal stenosis. Stable degenerative and postoperative changes in the lumbar spine.   Electronically Signed   By: Logan Bores   On: 05/03/2015 08:23    Scheduled Meds: . ALPRAZolam  0.5 mg Oral BID  . cholecalciferol  1,000 Units Oral Daily  . dexamethasone  4 mg Oral Daily  . dicyclomine  20 mg Oral TID WC  . DULoxetine  30 mg Oral Daily  . feeding supplement (ENSURE ENLIVE)  237 mL Oral BID BM  . feeding supplement (RESOURCE BREEZE)  1 Container Oral TID BM  . fluconazole  100 mg Oral Daily  . folic acid  1 mg Oral Daily  . gabapentin  400 mg Oral TID  . insulin aspart  0-9 Units Subcutaneous TID WC  . irbesartan  300 mg Oral Daily  . Linaclotide  145 mcg Oral Daily  . loratadine  10 mg Oral q morning - 10a  . metoCLOPramide  5 mg Oral TID AC  . metoprolol tartrate  25 mg Oral Daily  . nicotine  21 mg Transdermal Daily  . OXcarbazepine  150 mg Oral TID   . OxyCODONE  40 mg Oral 3 times per day  . pantoprazole  40 mg Oral Daily  . polyethylene glycol  17 g Oral Daily  . senna-docusate  2 tablet Oral BID  . simvastatin  20 mg  Oral QHS  . sodium chloride  3 mL Intravenous Q12H   Continuous Infusions: . sodium chloride 100 mL/hr at 05/04/15 1056    Principal Problem:   Abdominal pain Active Problems:   Lung cancer, right upper lobe   Anxiety disorder   Bipolar 1 disorder   Hepatitis C   Hepatitis B   Hypertension   Diabetes mellitus   Spinal stenosis of lumbar region L L%-S1   Metastatic primary lung cancer   Osseous metastasis   Metastatic cancer to bone   Depression   Palliative care encounter   Generalized anxiety disorder   Protein-calorie malnutrition, severe   AP (abdominal pain)   SIRS (systemic inflammatory response syndrome)   Hypokalemia   GERD (gastroesophageal reflux disease)   Malnutrition of moderate degree   Chronic pain   Lactic acidosis   Neuropathic pain   Constipation due to opioid therapy   Anxiety and depression   Mouna Yager, [facility] Pager [phone]. If 7PM-7AM, please contact night-coverage at [email or web address], password [facility] 05/04/2015, 4:13 PM  LOS: 4 days

## 2015-05-05 ENCOUNTER — Other Ambulatory Visit: Payer: Self-pay

## 2015-05-05 ENCOUNTER — Ambulatory Visit: Payer: Self-pay | Admitting: Physician Assistant

## 2015-05-05 ENCOUNTER — Ambulatory Visit: Payer: Self-pay

## 2015-05-05 DIAGNOSIS — G893 Neoplasm related pain (acute) (chronic): Principal | ICD-10-CM

## 2015-05-05 LAB — GLUCOSE, CAPILLARY
GLUCOSE-CAPILLARY: 107 mg/dL — AB (ref 65–99)
GLUCOSE-CAPILLARY: 144 mg/dL — AB (ref 65–99)
GLUCOSE-CAPILLARY: 162 mg/dL — AB (ref 65–99)
Glucose-Capillary: 113 mg/dL — ABNORMAL HIGH (ref 65–99)

## 2015-05-05 MED ORDER — HYDROXYZINE HCL 25 MG PO TABS
25.0000 mg | ORAL_TABLET | Freq: Three times a day (TID) | ORAL | Status: DC | PRN
  Administered 2015-05-05 – 2015-05-06 (×3): 25 mg via ORAL
  Filled 2015-05-05 (×3): qty 1

## 2015-05-05 MED ORDER — OXYCODONE HCL ER 40 MG PO T12A
60.0000 mg | EXTENDED_RELEASE_TABLET | Freq: Three times a day (TID) | ORAL | Status: DC
  Administered 2015-05-05 – 2015-05-07 (×6): 60 mg via ORAL
  Filled 2015-05-05 (×12): qty 1

## 2015-05-05 MED ORDER — OXYCODONE HCL 5 MG PO TABS
20.0000 mg | ORAL_TABLET | ORAL | Status: DC | PRN
  Administered 2015-05-05 – 2015-05-06 (×9): 20 mg via ORAL
  Filled 2015-05-05 (×9): qty 4

## 2015-05-05 MED ORDER — POLYETHYLENE GLYCOL 3350 17 G PO PACK
17.0000 g | PACK | Freq: Two times a day (BID) | ORAL | Status: DC
  Administered 2015-05-05 – 2015-05-07 (×4): 17 g via ORAL
  Filled 2015-05-05 (×5): qty 1

## 2015-05-05 MED ORDER — HYDROMORPHONE HCL 1 MG/ML IJ SOLN
0.5000 mg | Freq: Four times a day (QID) | INTRAMUSCULAR | Status: DC | PRN
  Administered 2015-05-05 – 2015-05-07 (×6): 0.5 mg via INTRAVENOUS
  Filled 2015-05-05 (×6): qty 1

## 2015-05-05 MED ORDER — ALPRAZOLAM 0.5 MG PO TABS
0.5000 mg | ORAL_TABLET | Freq: Three times a day (TID) | ORAL | Status: DC
  Administered 2015-05-05 – 2015-05-07 (×5): 0.5 mg via ORAL
  Filled 2015-05-05 (×5): qty 1

## 2015-05-05 NOTE — Progress Notes (Addendum)
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 difficile, COPD, seizure, HCV and HBV admitted to [facility] on 04/30/15 with severe abdominal pain, nausea and vomiting. CT abdomen revealed progression of her bony metastases but no abdominal/pelvic metastasis or other acute findings. It is felt that her abdominal pain is related to her pelvic bone metastases which was treated with palliative RT recently.  Assessment/Plan:  Abdominal pain/cancer related pain:  - Suspect chronic severe abdominal pain that is likely due to metastasized lung cancer with known bone mets/pelvic metastases.  - Abdomen/pelvis CT showed diffuse metastasized disease that appears worse from prior studies. Lipase negative. Urinalysis is negative. - Palliative care assisting with symptom management and have increased her OxyContin to 60 MG TID, continue oxycodone 20 MG every 2 when necessary, decreasing IV Dilaudid to every 6 with plan to DC IV pain medications 6/16. Continue Neurontin for neuropathic pain  Tachycardia that is unlikely SIRS - Patient met criteria for SIRS, with elevated lactate and tachycardia, no source of infection identified, however, procalcitonin was <0.10 suggesting non-infectious pathology - Likely due to metastatic cancer and severe pain - blood culture neg x 2 - SIRS physiology resolved  Metastasized lung cancer, right upper lobe:  - Pt with stage 1 right lung cancer status post lobectomy in 2014, now with diffuse bony metastasis, and no acute findings in the abdomen or pelvis by CT - Pt followed by Dr. Burr Medico, last seen on 04/28/15 with plans to start chemotherapy on 05/05/15. - Appreciate input by Dr. Burr Medico. Paged Dr. Burr Medico to discuss. - Continue Decadron 4 mg daily  Anxiety:   - Seems to be stable. No suicidal or homicidal ideations.  -Continue Xanax at current doses and avoid escalating due to concern for sedation while on increasing doses of narcotics.  HTN:  - Blood pressure poorly controlled, likely result of acute pain -Hydralazine IV when necessary -Pain control as above -Metoprolol scheduled - Also on PRN labetalol IV for HR>115 or uncontrolled BP  Seizure:  - continued oxcabazepine - Stable Currently  DM-II:  - Last A1c 6.1 on 03/08/15, well controled.  - Patient on metformin at home - Cont on SSI  Protein-calorie malnutrition, severe: - Ensure - Nutrition following  COPD:  - Stable. No signs of acute exacerbation. -DuoNeb PRN  GERD: - Protonix  Hypokalemia:  - K= 2.9 on admission. - Replaced  Hypomagnesemia - Replaced  Anemia and thrombocytopenia - Likely related to cancer treatment and chronic disease. Follow CBCs     DVT prophylaxis: SCDs. No heparin products secondary to significant thrombocytopenia. Code Status: Full Family Communication: Pt in room Disposition Plan: Possible d/c home when tolerates PO   Consultants:  Oncology   Palliative care medicine  Procedures:  None  Antibiotics:  None  Subjective: Continues to complain of uncontrolled severe pain in various locations-legs, abdomen, back. No chest pain or dyspnea.  Objective: Filed Vitals:   05/04/15 2236 05/05/15 0411 05/05/15 0545 05/05/15 1319  BP: 158/84  174/93 157/78  Pulse: 97 86  98  Temp: 98.1 F (36.7 C) 98.5 F (36.9 C)  98.7 F (37.1 C)  TempSrc: Oral Oral  Oral  Resp: 16 18  18   Height:      Weight:      SpO2: 98% 97%  97%    Intake/Output Summary (Last 24  hours) at 05/05/15 1507 Last data filed at 05/04/15 1852  Gross per 24 hour  Intake    640 ml  Output      0 ml  Net    640 ml   Filed Weights   04/30/15 0900  Weight: 55.929 kg (123 lb 4.8 oz)     Exam:  General exam: Pleasant middle-aged female  sitting up comfortably in bed. Respiratory system: Clear. No increased work of breathing. Cardiovascular system: S1 & S2 heard, RRR. No JVD, murmurs, gallops, clicks or pedal edema. not on telemetry.  Gastrointestinal system: Abdomen is nondistended, soft and nontender. Normal bowel sounds heard. Central nervous system: Alert and oriented. No focal neurological deficits. Extremities: Symmetric 5 x 5 power.   Data Reviewed: Basic Metabolic Panel:  Recent Labs Lab 04/28/15 2327 04/30/15 0045 04/30/15 0915 05/01/15 0530 05/02/15 0359  NA 138 137  --  142 142  K 3.7 2.9*  --  3.3* 4.2  CL 105 100*  --  111 112*  CO2 24 21*  --  23 24  GLUCOSE 113* 203*  --  104* 99  BUN 13 16  --  18 14  CREATININE 0.54 0.58  --  0.47 0.44  CALCIUM 8.7* 9.5  --  8.8* 8.9  MG  --   --  1.7  --   --    Liver Function Tests:  Recent Labs Lab 04/28/15 2327 04/30/15 0045 05/01/15 0530  AST 18 25 16   ALT 10* 12* 9*  ALKPHOS 464* 558* 437*  BILITOT 0.7 1.4* 0.8  PROT 6.4* 7.6 5.9*  ALBUMIN 3.2* 3.8 3.1*    Recent Labs Lab 04/30/15 0045  LIPASE 11*   No results for input(s): AMMONIA in the last 168 hours. CBC:  Recent Labs Lab 04/28/15 2327 04/30/15 0045 05/01/15 0530 05/02/15 0359  WBC 7.9 9.3 6.6 7.4  NEUTROABS 6.2 7.5  --   --   HGB 9.0* 11.0* 8.1* 7.9*  HCT 28.4* 33.0* 24.7* 24.5*  MCV 89.0 87.1 88.8 89.7  PLT 69* 73* 57* 62*   Cardiac Enzymes: No results for input(s): CKTOTAL, CKMB, CKMBINDEX, TROPONINI in the last 168 hours. BNP (last 3 results) No results for input(s): PROBNP in the last 8760 hours. CBG:  Recent Labs Lab 05/04/15 1132 05/04/15 1627 05/04/15 2234 05/05/15 0730 05/05/15 1137  GLUCAP 100* 162* 194* 107* 113*    Recent Results (from the past 240 hour(s))  TECHNOLOGIST REVIEW     Status: None   Collection Time: 04/28/15  1:05 PM  Result Value Ref Range Status   Technologist Review Metas and Myelocytes present. 4% nrbcs  Final  Culture, blood  (x 2)     Status: None (Preliminary result)   Collection Time: 04/30/15  8:40 AM  Result Value Ref Range Status   Specimen Description BLOOD LEFT FINGER  Final   Special Requests BOTTLES DRAWN AEROBIC ONLY 0.5 CC  Final   Culture   Final           BLOOD CULTURE RECEIVED NO GROWTH TO DATE CULTURE WILL BE HELD FOR 5 DAYS BEFORE ISSUING A FINAL NEGATIVE REPORT Note: Culture results may be compromised due to an inadequate volume of blood received in culture bottles. Performed at [facility]    Report Status PENDING  Incomplete  Culture, blood (x 2)     Status: None (Preliminary result)   Collection Time: 04/30/15 11:15 AM  Result Value Ref Range Status   Specimen Description BLOOD RIGHT  ARM  Final   Special Requests BOTTLES DRAWN AEROBIC ONLY 10 CC BLUE  Final   Culture   Final           BLOOD CULTURE RECEIVED NO GROWTH TO DATE CULTURE WILL BE HELD FOR 5 DAYS BEFORE ISSUING A FINAL NEGATIVE REPORT Performed at [facility]    Report Status PENDING  Incomplete           Studies: No results found.      Scheduled Meds: . ALPRAZolam  0.5 mg Oral BID  . cholecalciferol  1,000 Units Oral Daily  . dexamethasone  4 mg Oral Daily  . dicyclomine  20 mg Oral TID WC  . DULoxetine  30 mg Oral Daily  . feeding supplement (ENSURE ENLIVE)  237 mL Oral BID BM  . feeding supplement (RESOURCE BREEZE)  1 Container Oral TID BM  . fluconazole  100 mg Oral Daily  . folic acid  1 mg Oral Daily  . gabapentin  400 mg Oral TID  . insulin aspart  0-9 Units Subcutaneous TID WC  . irbesartan  300 mg Oral Daily  . Linaclotide  145 mcg Oral Daily  . loratadine  10 mg Oral q morning - 10a  . metoCLOPramide  5 mg Oral TID AC  . metoprolol tartrate  25 mg Oral Daily  . nicotine  21 mg Transdermal Daily  . OXcarbazepine  150 mg Oral TID  . OxyCODONE  60 mg Oral 3 times per day  . pantoprazole  40 mg Oral Daily  . polyethylene glycol  17 g Oral Daily  . senna-docusate  2 tablet Oral  BID  . simvastatin  20 mg Oral QHS  . sodium chloride  3 mL Intravenous Q12H   Continuous Infusions: . sodium chloride 100 mL/hr at 05/05/15 1165    Principal Problem:   Abdominal pain Active Problems:   Lung cancer, right upper lobe   Anxiety disorder   Bipolar 1 disorder   Hepatitis C   Hepatitis B   Hypertension   Diabetes mellitus   Spinal stenosis of lumbar region L L%-S1   Metastatic primary lung cancer   Osseous metastasis   Metastatic cancer to bone   Depression   Palliative care encounter   Generalized anxiety disorder   Protein-calorie malnutrition, severe   AP (abdominal pain)   SIRS (systemic inflammatory response syndrome)   Hypokalemia   GERD (gastroesophageal reflux disease)   Malnutrition of moderate degree   Chronic pain   Lactic acidosis   Neuropathic pain   Constipation due to opioid therapy   Anxiety and depression    Time spent: 25 minutes.    Vernell Leep, MD, FACP, FHM. [facility] Pager [phone]  If 7PM-7AM, please contact night-coverage [email or web address] Password [email or web address] 05/05/2015, 3:07 PM    LOS: 5 days

## 2015-05-05 NOTE — Care Management Note (Signed)
Case Management Note  Patient Details  Name: [name] [name] MRN: [id number] Date of Birth: 03-24-1951  Subjective/Objective: Pain control issues.Palliative care team following-GOC, pain mgmnt.From home. PT-no f/u. Currently no home health skilled need.                 Action/Plan:d/c plan home.   Expected Discharge Date:                  Expected Discharge Plan:  Home/Self Care  In-House Referral:     Discharge planning Services     Post Acute Care Choice:    Choice offered to:     DME Arranged:    DME Agency:     HH Arranged:    HH Agency:     Status of Service:  In process, will continue to follow  Medicare Important Message Given:  Yes Date Medicare IM Given:  05/03/15 Medicare IM give by:  Dessa Phi Date Additional Medicare IM Given:    Additional Medicare Important Message give by:     If discussed at [place] of Stay Meetings, dates discussed:    Additional Comments:  Dessa Phi, RN 05/05/2015, 11:40 AM

## 2015-05-05 NOTE — Progress Notes (Signed)
Daily Progress Note   Patient Name: [name] [name]       Date: 05/05/2015 DOB: Sep 22, 1951  Age: 64 y.o. MRN#: [id number] Attending Physician: [name] Jansky, MD Primary Care Physician: [name] Junior, MD Admit Date: 04/29/2015  Reason for Consultation/Follow-up: Establishing goals of care and Pain control  Subjective: Pain poorly controlled this morning per her report.  Used less oral medicine but continues to use IV on schedule. Itching problematic but states anxiety driving this as well.  Moving bowels.  Had 1 episode of N/V which is now resolved. Pain down to 5/10 at best and more often 8-9/10.   Length of Stay: 5 days  Current Medications: Scheduled Meds:  . ALPRAZolam  0.5 mg Oral BID  . cholecalciferol  1,000 Units Oral Daily  . dexamethasone  4 mg Oral Daily  . dicyclomine  20 mg Oral TID WC  . DULoxetine  30 mg Oral Daily  . feeding supplement (ENSURE ENLIVE)  237 mL Oral BID BM  . feeding supplement (RESOURCE BREEZE)  1 Container Oral TID BM  . fluconazole  100 mg Oral Daily  . folic acid  1 mg Oral Daily  . gabapentin  400 mg Oral TID  . insulin aspart  0-9 Units Subcutaneous TID WC  . irbesartan  300 mg Oral Daily  . Linaclotide  145 mcg Oral Daily  . loratadine  10 mg Oral q morning - 10a  . metoCLOPramide  5 mg Oral TID AC  . metoprolol tartrate  25 mg Oral Daily  . nicotine  21 mg Transdermal Daily  . OXcarbazepine  150 mg Oral TID  . OxyCODONE  60 mg Oral 3 times per day  . pantoprazole  40 mg Oral Daily  . polyethylene glycol  17 g Oral Daily  . senna-docusate  2 tablet Oral BID  . simvastatin  20 mg Oral QHS  . sodium chloride  3 mL Intravenous Q12H    Continuous Infusions: . sodium chloride 100 mL/hr at 05/05/15 0418    PRN Meds: bisacodyl, hydrALAZINE, HYDROmorphone (DILAUDID) injection, hydrOXYzine, ipratropium-albuterol, labetalol, magnesium hydroxide, MUSCLE RUB, ondansetron, oxyCODONE, promethazine, sodium chloride    Vital Signs: BP  157/78 mmHg  Pulse 98  Temp(Src) 98.7 F (37.1 C) (Oral)  Resp 18  Ht '5\' 4"'$  (1.626 m)  Wt 55.929 kg (123 lb 4.8 oz)  BMI 21.15 kg/m2  SpO2 97% SpO2: SpO2: 97 % O2 Device: O2 Device: Not Delivered O2 Flow Rate:    Intake/output summary:  Intake/Output Summary (Last 24 hours) at 05/05/15 1435 Last data filed at 05/04/15 1852  Gross per 24 hour  Intake    640 ml  Output      0 ml  Net    640 ml   Baseline Weight: Weight: 55.929 kg (123 lb 4.8 oz) Most recent weight: Weight: 55.929 kg (123 lb 4.8 oz)  Physical Exam: GEN: alert, NAD HEENT: [place], sclera anciteric CV: tachy          Additional Data Reviewed: No results for input(s): WBC, HGB, PLT, NA, BUN, CREATININE, ALB in the last 72 hours.   Problem List:  Patient Active Problem List   Diagnosis Date Noted  . Neuropathic pain   . Constipation due to opioid therapy   . Anxiety and depression   . Lactic acidosis   . SIRS (systemic inflammatory response syndrome) 04/30/2015  . Malnutrition of moderate degree 04/30/2015  . Dehydration   . Hypokalemia   . GERD (gastroesophageal reflux disease)   .  Chronic pain   . AP (abdominal pain)   . Protein-calorie malnutrition, severe 04/04/2015  . Nausea and vomiting 04/02/2015  . Nausea & vomiting 04/02/2015  . Left lateral abdominal pain 04/02/2015  . DM gastroparesis 04/02/2015  . Generalized anxiety disorder 04/02/2015  . Anxiety state   . Palliative care encounter   . Depression   . CN (constipation)   . Metastatic cancer to bone   . Metastatic adenocarcinoma 03/08/2015  . Nausea with vomiting 03/08/2015  . Cancer associated pain 03/08/2015  . Osseous metastasis 02/17/2015  . Uncontrolled pain 02/17/2015  . Metastatic primary lung cancer 02/11/2015  . Radiculopathy 03/05/2014  . Intermittent diarrhea 02/07/2014  . Spinal stenosis of lumbar region L L%-S1 08/10/2013  . Other and unspecified noninfectious gastroenteritis and colitis(558.9) 05/27/2013  . Abdominal  pain 05/26/2013  . Hepatitis C 05/26/2013  . Hepatitis B 05/26/2013  . Hypertension 05/26/2013  . Diabetes mellitus 05/26/2013  . Nausea vomiting and diarrhea 03/08/2013  . Abdominal pain, epigastric 03/08/2013  . Enteritis due to Clostridium difficile 01/02/2013  . Tachycardia 12/30/2012  . Bipolar 1 disorder 12/29/2012  . S/P thoracotomy 12/10/2012  . Bipolar disorder 12/08/2012  . Anxiety disorder 12/08/2012  . Lung cancer, right upper lobe 11/07/2012     Palliative Care Assessment & Plan  64 yo female with PMHx of stage IV NSCLC admitted with abdominal pain, N/V  Code Status:  Full code  Goals of Care:  Focus again on symptoms today.  She would benefit from outpatient palliative care. Unfortunately we do not have clinic.  She is to meet with Oncology some time next week.   Desire for further Chaplaincy support:yes  3. Symptom Management:  Cancer related pain- not using much oral and continues to use IV breakthrough frequently.  While she reports pain is still severe, she is still at least able to ambulate and has been sleeping.  I will go ahead and increase her oxycontin to '60mg'$  TID and continue '20mg'$  PRN (allow q2h PRN).  Will decrease frequency of IV dilaudid to q6h with plan to d/c IV pain medicine tomorrow. I think she should do okay on this regimen.    Constipation- moving bowels continue regimen  Neuropathic pain- related to DM and perhaps from spinal disease. cymbalta. Continue neurontin. Consider increase in cymbalta as outpatient  Anxiety/Depression- on BID xanax. Cautious with bezo's and high dose opioids. Added cymbalta as above  N/V- relieved with zofran.    Pruritis- a lot she feels is driven by anxiety. She is actually peeling skin from face.  Will try PRN atarax and d/c benadryl.    5. Prognosis: Unable to determine  5. Discharge Planning: Home with [place] was discussed with patient  Thank you for allowing the Palliative  Medicine Team to assist in the care of this patient.   Total Time; 25 minutes Greater than 50%  of this time was spent counseling and coordinating care related to the above assessment and plan.   [name] Clay, DO  05/05/2015, 2:35 PM  Please contact Palliative Medicine Team phone at [phone] for questions and concerns.

## 2015-05-06 ENCOUNTER — Telehealth: Payer: Self-pay

## 2015-05-06 ENCOUNTER — Other Ambulatory Visit: Payer: Self-pay | Admitting: Hematology

## 2015-05-06 ENCOUNTER — Telehealth: Payer: Self-pay | Admitting: Hematology

## 2015-05-06 DIAGNOSIS — Z515 Encounter for palliative care: Secondary | ICD-10-CM | POA: Insufficient documentation

## 2015-05-06 DIAGNOSIS — D696 Thrombocytopenia, unspecified: Secondary | ICD-10-CM

## 2015-05-06 DIAGNOSIS — R110 Nausea: Secondary | ICD-10-CM

## 2015-05-06 DIAGNOSIS — F329 Major depressive disorder, single episode, unspecified: Secondary | ICD-10-CM

## 2015-05-06 DIAGNOSIS — E119 Type 2 diabetes mellitus without complications: Secondary | ICD-10-CM

## 2015-05-06 DIAGNOSIS — R109 Unspecified abdominal pain: Secondary | ICD-10-CM

## 2015-05-06 DIAGNOSIS — G8929 Other chronic pain: Secondary | ICD-10-CM

## 2015-05-06 DIAGNOSIS — D649 Anemia, unspecified: Secondary | ICD-10-CM

## 2015-05-06 DIAGNOSIS — I10 Essential (primary) hypertension: Secondary | ICD-10-CM

## 2015-05-06 DIAGNOSIS — C3490 Malignant neoplasm of unspecified part of unspecified bronchus or lung: Secondary | ICD-10-CM

## 2015-05-06 LAB — CBC
HCT: 26.9 % — ABNORMAL LOW (ref 36.0–46.0)
HEMATOCRIT: 22.2 % — AB (ref 36.0–46.0)
HEMOGLOBIN: 8.7 g/dL — AB (ref 12.0–15.0)
Hemoglobin: 7 g/dL — ABNORMAL LOW (ref 12.0–15.0)
MCH: 28.3 pg (ref 26.0–34.0)
MCH: 29.3 pg (ref 26.0–34.0)
MCHC: 31.5 g/dL (ref 30.0–36.0)
MCHC: 32.3 g/dL (ref 30.0–36.0)
MCV: 89.9 fL (ref 78.0–100.0)
MCV: 90.6 fL (ref 78.0–100.0)
PLATELETS: 42 10*3/uL — AB (ref 150–400)
PLATELETS: 38 10*3/uL — AB (ref 150–400)
RBC: 2.47 MIL/uL — ABNORMAL LOW (ref 3.87–5.11)
RBC: 2.97 MIL/uL — AB (ref 3.87–5.11)
RDW: 17 % — AB (ref 11.5–15.5)
RDW: 16.4 % — ABNORMAL HIGH (ref 11.5–15.5)
WBC: 7.1 10*3/uL (ref 4.0–10.5)
WBC: 7.9 10*3/uL (ref 4.0–10.5)

## 2015-05-06 LAB — GLUCOSE, CAPILLARY
GLUCOSE-CAPILLARY: 117 mg/dL — AB (ref 65–99)
Glucose-Capillary: 127 mg/dL — ABNORMAL HIGH (ref 65–99)
Glucose-Capillary: 136 mg/dL — ABNORMAL HIGH (ref 65–99)
Glucose-Capillary: 147 mg/dL — ABNORMAL HIGH (ref 65–99)

## 2015-05-06 LAB — COMPREHENSIVE METABOLIC PANEL
ALT: 12 U/L — AB (ref 14–54)
ANION GAP: 9 (ref 5–15)
AST: 25 U/L (ref 15–41)
Albumin: 2.9 g/dL — ABNORMAL LOW (ref 3.5–5.0)
Alkaline Phosphatase: 512 U/L — ABNORMAL HIGH (ref 38–126)
BILIRUBIN TOTAL: 0.4 mg/dL (ref 0.3–1.2)
BUN: 10 mg/dL (ref 6–20)
CALCIUM: 8.8 mg/dL — AB (ref 8.9–10.3)
CO2: 28 mmol/L (ref 22–32)
Chloride: 105 mmol/L (ref 101–111)
Creatinine, Ser: 0.44 mg/dL (ref 0.44–1.00)
GFR calc Af Amer: >60 mL/min (ref >60)
GFR calc non Af Amer: >60 mL/min (ref >60)
Glucose, Bld: 95 mg/dL (ref 65–99)
Potassium: 3.2 mmol/L — ABNORMAL LOW (ref 3.5–5.1)
SODIUM: 142 mmol/L (ref 135–145)
Total Protein: 5.5 g/dL — ABNORMAL LOW (ref 6.5–8.1)

## 2015-05-06 LAB — CULTURE, BLOOD (ROUTINE X 2)
CULTURE: NO GROWTH
Culture: NO GROWTH

## 2015-05-06 LAB — PREPARE RBC (CROSSMATCH)

## 2015-05-06 MED ORDER — POLYETHYLENE GLYCOL 3350 17 G PO PACK: 17.0000 g | PACK | Freq: Two times a day (BID) | ORAL | Status: DC

## 2015-05-06 MED ORDER — NICOTINE 21 MG/24HR TD PT24: 21.0000 mg | MEDICATED_PATCH | Freq: Every day | TRANSDERMAL | Status: DC

## 2015-05-06 MED ORDER — BOOST / RESOURCE BREEZE PO LIQD: 1.0000 | Freq: Three times a day (TID) | ORAL | Status: DC

## 2015-05-06 MED ORDER — OLMESARTAN MEDOXOMIL 40 MG PO TABS: 40.0000 mg | ORAL_TABLET | Freq: Every day | ORAL | Status: DC

## 2015-05-06 MED ORDER — SENNOSIDES-DOCUSATE SODIUM 8.6-50 MG PO TABS: 2.0000 | ORAL_TABLET | Freq: Two times a day (BID) | ORAL | Status: DC

## 2015-05-06 MED ORDER — OXYCODONE HCL ER 20 MG PO T12A: 60.0000 mg | EXTENDED_RELEASE_TABLET | Freq: Three times a day (TID) | ORAL | Status: DC

## 2015-05-06 MED ORDER — OXYCODONE HCL 5 MG PO TABS
20.0000 mg | ORAL_TABLET | ORAL | Status: DC | PRN
  Administered 2015-05-07 (×3): 20 mg via ORAL
  Filled 2015-05-06 (×3): qty 4

## 2015-05-06 MED ORDER — DULOXETINE HCL 30 MG PO CPEP: 30.0000 mg | ORAL_CAPSULE | Freq: Every day | ORAL | Status: AC

## 2015-05-06 MED ORDER — SODIUM CHLORIDE 0.9 % IV SOLN: Freq: Once | INTRAVENOUS | Status: DC

## 2015-05-06 MED ORDER — OXYCODONE HCL 10 MG PO TABS: 10.0000 mg | ORAL_TABLET | ORAL | Status: DC | PRN

## 2015-05-06 MED ORDER — ENSURE ENLIVE PO LIQD: 237.0000 mL | Freq: Two times a day (BID) | ORAL | Status: DC

## 2015-05-06 MED ORDER — POTASSIUM CHLORIDE CRYS ER 20 MEQ PO TBCR
40.0000 meq | EXTENDED_RELEASE_TABLET | Freq: Once | ORAL | Status: AC
  Administered 2015-05-06: 40 meq via ORAL
  Filled 2015-05-06: qty 2

## 2015-05-06 NOTE — Progress Notes (Signed)
Nutrition Follow-up  DOCUMENTATION CODES:  Non-severe (moderate) malnutrition in context of chronic illness  INTERVENTION: - Continue Ensure Enlive BID and Resource Breeze TID - RD will continue to monitor for needs  NUTRITION DIAGNOSIS:  Inadequate oral intake related to nausea as evidenced by per patient/family report. -resolving with 25-100% intakes and supplement use  GOAL:  Patient will meet greater than or equal to 90% of their needs -unmet  MONITOR:  PO intake, Supplement acceptance, Weight trends, Labs, I & O's  ASSESSMENT: Per H&P, 64 y.o. female with PMH of diffusely metastasized right upper lobe lung cancer (s/p of lobectomy in 2014), hypertension, diabetes mellitus, GERD, anxiety, bipolar disorder, chronic back pain, history of C. difficile colitis, COPD, seizure, HCV and HBV, who presents with a severe abdominal pain. Patient reports that in the past several days she has been having worsening severe abdominal pain. It constantly, 10 out of 10 in severity, nonradiating. It is associated with nausea or vomiting. She vomited once today. She does not have diarrhea, fever or chills.  6/16: - Diet advanced from CLD to FLD 6/11 and from [place] to Regular 6/13 - Pt unable to be aroused to name call x4  - Visualized breakfast tray with 50% completion and 25-50% completion of Resource Breeze - Per chart review, pt ate 100% breakfast and lunch 6/15 and 25% dinner 6/14 with no other intakes documented - Likely variably meeting needs - Medications reviewed  Labs: CBGs: 105-194 mg/dL, K: 3.2 mmol/L   6/10: - Diet advanced from NPO to CLD and had not yet received a  Tray - Ongoing nausea since PTA and complains of severe, all over pain - States appetite has been poor for a few weeks and that during that time she lost 30 lbs from UBW of 157 lbs - Lost 11 lbs (8% body weight) in 2 months which is significant for time frame - Unable to meet needs and Ensure Enlive and [employer]  already ordered  Height:  Ht Readings from Last 1 Encounters:  04/30/15 '5\' 4"'$  (1.626 m)    Weight:  Wt Readings from Last 1 Encounters:  04/30/15 123 lb 4.8 oz (55.929 kg)    Ideal Body Weight:  54.5 kg (kg)  Wt Readings from Last 10 Encounters:  04/30/15 123 lb 4.8 oz (55.929 kg)  04/28/15 123 lb 4.8 oz (55.929 kg)  04/14/15 130 lb 4.8 oz (59.104 kg)  04/02/15 125 lb 10.6 oz (57 kg)  03/30/15 129 lb 8 oz (58.741 kg)  03/26/15 133 lb 1.6 oz (60.374 kg)  03/23/15 131 lb 9.6 oz (59.693 kg)  03/16/15 132 lb 6.4 oz (60.056 kg)  03/11/15 137 lb (62.143 kg)  02/26/15 134 lb (60.782 kg)    BMI:  Body mass index is 21.15 kg/(m^2).  Estimated Nutritional Needs:  Kcal:  1400-1600  Protein:  55-70 grams  Fluid:  2 L/day  Skin:  Reviewed, no issues  Diet Order:  Diet regular Room service appropriate?: Yes; Fluid consistency:: Thin  EDUCATION NEEDS:  No education needs identified at this time  No intake or output data in the 24 hours ending 05/06/15 0951  Last BM:  6/13    Jarome Matin, RD, LDN Inpatient Clinical Dietitian Pager # [phone] After hours/weekend pager # [phone]

## 2015-05-06 NOTE — Telephone Encounter (Signed)
Juliann Pulse the case manager from [facility] called asking if we might be able to help with medical assistance for pt.

## 2015-05-06 NOTE — Progress Notes (Signed)
[name] [name]   DOB:[birth year]   GY#:[id number]   OJJ#:[id number]  Subjective: Her pain is better controlled with increased dose of OxyContin and oxycodone. She has been eating better in the past few days. No fever or bleeding.  Objective:  Filed Vitals:   05/06/15 1535  BP: 149/89  Pulse: 89  Temp: 98.8 F (37.1 C)  Resp: 18    Body mass index is 21.15 kg/(m^2). No intake or output data in the 24 hours ending 05/06/15 1547   Sclerae unicteric  Oropharynx clear  No peripheral adenopathy  Lungs clear -- no rales or rhonchi  Heart regular rate and rhythm  Abdomen benign  MSK no focal spinal tenderness, no peripheral edema  Neuro nonfocal    CBG (last 3)   Recent Labs  05/05/15 2153 05/06/15 0743 05/06/15 1132  GLUCAP 162* 127* 117*     Labs:  Lab Results  Component Value Date   WBC 7.1 05/06/2015   HGB 7.0* 05/06/2015   HCT 22.2* 05/06/2015   MCV 89.9 05/06/2015   PLT 42* 05/06/2015   NEUTROABS 7.5 04/30/2015    '@LASTCHEMISTRY'$ @  Urine Studies No results for input(s): UHGB, CRYS in the last 72 hours.  Invalid input(s): UACOL, UAPR, USPG, UPH, UTP, UGL, UKET, UBIL, UNIT, UROB, ULEU, UEPI, UWBC, URBC, UBAC, CAST, [place], [place]  Basic Metabolic Panel:  Recent Labs Lab 04/30/15 0045 04/30/15 0915 05/01/15 0530 05/02/15 0359 05/06/15 0501  NA 137  --  142 142 142  K 2.9*  --  3.3* 4.2 3.2*  CL 100*  --  111 112* 105  CO2 21*  --  '23 24 28  '$ GLUCOSE 203*  --  104* 99 95  BUN 16  --  '18 14 10  '$ CREATININE 0.58  --  0.47 0.44 0.44  CALCIUM 9.5  --  8.8* 8.9 8.8*  MG  --  1.7  --   --   --    GFR Estimated Creatinine Clearance: 59.7 mL/min (by C-G formula based on Cr of 0.44). Liver Function Tests:  Recent Labs Lab 04/30/15 0045 05/01/15 0530 05/06/15 0501  AST '25 16 25  '$ ALT 12* 9* 12*  ALKPHOS 558* 437* 512*  BILITOT 1.4* 0.8 0.4  PROT 7.6 5.9* 5.5*  ALBUMIN 3.8 3.1* 2.9*    Recent Labs Lab 04/30/15 0045  LIPASE 11*   No results for  input(s): AMMONIA in the last 168 hours. Coagulation profile  Recent Labs Lab 04/30/15 0915  INR 1.54*    CBC:  Recent Labs Lab 04/30/15 0045 05/01/15 0530 05/02/15 0359 05/06/15 0501  WBC 9.3 6.6 7.4 7.1  NEUTROABS 7.5  --   --   --   HGB 11.0* 8.1* 7.9* 7.0*  HCT 33.0* 24.7* 24.5* 22.2*  MCV 87.1 88.8 89.7 89.9  PLT 73* 57* 62* 42*   Cardiac Enzymes: No results for input(s): CKTOTAL, CKMB, CKMBINDEX, TROPONINI in the last 168 hours. BNP: Invalid input(s): POCBNP CBG:  Recent Labs Lab 05/05/15 1137 05/05/15 1636 05/05/15 2153 05/06/15 0743 05/06/15 1132  GLUCAP 113* 144* 162* 127* 117*   D-Dimer No results for input(s): DDIMER in the last 72 hours. Hgb A1c No results for input(s): HGBA1C in the last 72 hours. Lipid Profile No results for input(s): CHOL, HDL, LDLCALC, TRIG, CHOLHDL, LDLDIRECT in the last 72 hours. Thyroid function studies No results for input(s): TSH, T4TOTAL, T3FREE, THYROIDAB in the last 72 hours.  Invalid input(s): FREET3 Anemia work up No results for input(s): VITAMINB12, FOLATE, FERRITIN, TIBC,  IRON, RETICCTPCT in the last 72 hours. Microbiology Recent Results (from the past 240 hour(s))  TECHNOLOGIST REVIEW     Status: None   Collection Time: 04/28/15  1:05 PM  Result Value Ref Range Status   Technologist Review Metas and Myelocytes present. 4% nrbcs  Final  Culture, blood (x 2)     Status: None   Collection Time: 04/30/15  8:40 AM  Result Value Ref Range Status   Specimen Description BLOOD LEFT FINGER  Final   Special Requests BOTTLES DRAWN AEROBIC ONLY 0.5 CC  Final   Culture   Final    NO GROWTH 5 DAYS Note: Culture results may be compromised due to an inadequate volume of blood received in culture bottles. Performed at [facility]    Report Status 05/06/2015 FINAL  Final  Culture, blood (x 2)     Status: None   Collection Time: 04/30/15 11:15 AM  Result Value Ref Range Status   Specimen Description BLOOD RIGHT  ARM  Final   Special Requests BOTTLES DRAWN AEROBIC ONLY 10 CC BLUE  Final   Culture   Final    NO GROWTH 5 DAYS Performed at [facility]    Report Status 05/06/2015 FINAL  Final      Studies:  No results found.  Assessment: 64 y.o. 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   1. 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 Intermittent abdominal pain and nausea 3. Bipolar 4. Anxiety/ddepression 5. DM 6. HTN 7. Hep C 8. Worsening anemia and thrombocytopenia, likely related to her diffuse bone metastasis    Plan: -Her hemoglobin is 7 today, agree with blood transfusion. -Appreciated palliative care team input, she seems doing well with OxyContin '60mg'$  q12h, and oxycodone. She has filled the prescription I gave her lat week (OxyContin 20 mg, a total of 120 tablets) so will keep her on OxyContin for now. Her insurance has declined higher dose of OxyContin multiple times, I discussed the alternative option of sentinel patch, I'll follow her in my clinic and manage her pain medication. -She has a follow-up appointment with me next week, she will try to keep it  -I encouraged her to call our cancer center if she has any acute changes at home, and come to see [place] in our symptom management clinic, instead of going to emergency room.     Truitt Merle, MD 05/06/2015  3:47 PM

## 2015-05-06 NOTE — Discharge Summary (Signed)
Physician Discharge Summary  [name] [name] HBZ:[id number] DOB: September 14, 1951 DOA: 04/29/2015  PCP: Harvie Junior, MD  Admit date: 04/29/2015 Discharge date: 05/07/2015  Time spent: Greater than 30 minutes  Recommendations for Outpatient Follow-up:  1. Dr. Truitt Merle, Oncology on the 05/12/15 at 11:45 AM to be seen with repeat labs (CBC & BMP) 2. Dr. York Ram, PCP  Discharge Diagnoses:  Principal Problem:   Abdominal pain Active Problems:   Lung cancer, right upper lobe   Anxiety disorder   Bipolar 1 disorder   Hepatitis C   Hepatitis B   Hypertension   Diabetes mellitus   Spinal stenosis of lumbar region L L%-S1   Metastatic primary lung cancer   Osseous metastasis   Metastatic cancer to bone   Depression   Palliative care encounter   Generalized anxiety disorder   Protein-calorie malnutrition, severe   AP (abdominal pain)   SIRS (systemic inflammatory response syndrome)   Hypokalemia   GERD (gastroesophageal reflux disease)   Malnutrition of moderate degree   Chronic pain   Lactic acidosis   Neuropathic pain   Constipation due to opioid therapy   Anxiety and depression   Encounter for palliative care   Discharge Condition: Improved & Stable  Diet recommendation: Heart healthy diet.  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 difficile, COPD, seizure, HCV and HBV admitted to [facility] on 04/30/15 with severe abdominal pain, nausea and vomiting. CT abdomen revealed progression of her bony metastases but no abdominal/pelvic metastasis or other acute findings. It is felt that her abdominal pain is related to her pelvic bone metastases which was treated with palliative RT recently.  Hospital Course:   Abdominal pain/cancer related pain:  - Suspect chronic severe  abdominal pain that is likely due to metastasized lung cancer with known bone mets/pelvic metastases. - Abdomen/pelvis CT showed diffuse skeletal metastatic disease that appears worse from prior studies. Lipase negative. Urinalysis is negative. MRI thoracic and lumbar spine shows diffuse osseus metastasis and subtle ventral epidural tumor at T12 as seen on prior MRI without cord compression. - Palliative care assisted with symptom management. Continue Neurontin for neuropathic pain. Now that her OxyContin dose has been increased, will leave her on prior home dose of OxyIR for breakthrough pain at discharge. - Patient will DC on below oral pain regimen. Discussed at length with patient's oncologist who has recently filled prescriptions for OxyContin and oxycodone IR on 04/28/15 and patient is supposed to follow up with her for refills.  Tachycardia that is unlikely SIRS - Patient met criteria for SIRS, with elevated lactate and tachycardia, no source of infection identified, however, procalcitonin was <0.10 suggesting non-infectious pathology - Likely due to metastatic cancer and severe pain - blood culture neg x 2 - SIRS physiology resolved  Metastasized lung cancer, right upper lobe:  - Pt with stage 1 right lung cancer status post lobectomy in 2014, now with diffuse bony metastasis, and no acute findings in the abdomen or pelvis by CT - Pt followed by Dr. Burr Medico, last seen on 04/28/15 with plans to start chemotherapy on 05/05/15. - Continue Decadron 4 mg daily - Discussed with Dr. Burr Medico. Her follow-up appreciated-patient has appointment to see her for chemotherapy and labs on 6/22 and patient has been counseled to keep the appointment and she verbalizes understanding.  Anxiety/depression/bipolar:  - Seems to be stable. No suicidal or homicidal ideations.  -Continue Xanax at current doses and avoid escalating due to concern for sedation while on increasing doses of narcotics.  - Cymbalta added. - Her  psychiatric condition may also be contributing to difficulty in pain management.  HTN:  - Mildly uncontrolled. Continue metoprolol. Resumed ARB which was on hold as outpatient.   Seizure:  - continued oxcabazepine - Stable Currently  DM-II:  - Last A1c 6.1 on 03/08/15, well controled.  - Patient on metformin at home - Cont on SSI  Protein-calorie malnutrition, severe: - Ensure - Nutrition following  COPD:  - Stable. No signs of acute exacerbation. -DuoNeb PRN  GERD: - Protonix  Hypokalemia:  - K= 2.9 on admission. - Replace and follow   Hypomagnesemia - Replaced  Anemia and thrombocytopenia - Likely related to cancer treatment and chronic disease.  - Hemoglobin 7 g per DL today. Transfusing 1 unit of PRBC. Due to technical difficulty, transfusion was late and hence was not complete and total late this evening and hence patient unable to leave today-discharge postponed for 6/17 morning. - Worsening anemia and thrombocytopenia likely related to diffuse bone metastases.  Constipation  - Moving bowels with current bowel regimen.    DVT prophylaxis: SCDs. No heparin products secondary to significant thrombocytopenia. Code Status: Full Family Communication: Pt in room Disposition Plan: Possible d/c home 6/17    Consultants:  Oncology   Palliative care medicine  Procedures:  None   Discharge Exam:  Complaints:  Pain is controlled. Patient had a normal BM today.   Filed Vitals:   05/06/15 0422 05/06/15 1337 05/06/15 1535 05/06/15 1555  BP: 153/88 130/73 149/89 167/87  Pulse: 93 89 89 100  Temp: 98.6 F (37 C) 98.5 F (36.9 C) 98.8 F (37.1 C) 99 F (37.2 C)  TempSrc: Oral Oral Oral Oral  Resp: _0 Height:      Weight:      SpO2: 95% 98% 98% 98%     General exam: Pleasant middle-aged female seen ambulating comfortably in the room and arranging furniture. Respiratory system: Clear. No increased work of breathing. Cardiovascular  system: S1 & S2 heard, RRR. No JVD, murmurs, gallops, clicks or pedal edema. not on telemetry.  Gastrointestinal system: Abdomen is nondistended, soft and nontender. Normal bowel sounds heard. Central nervous system: Alert and oriented. No focal neurological deficits. Extremities: Symmetric 5 x 5 power.  Discharge Instructions      Discharge Instructions    Call MD for:  difficulty breathing, headache or visual disturbances    Complete by:  As directed      Call MD for:  extreme fatigue    Complete by:  As directed      Call MD for:  persistant dizziness or light-headedness    Complete by:  As directed      Call MD for:  persistant nausea and vomiting    Complete by:  As directed      Call MD for:  severe uncontrolled pain    Complete by:  As directed      Call MD for:  temperature >100.4    Complete by:  As directed      Diet - low sodium heart healthy    Complete by:  As directed      Diet Carb Modified    Complete by:  As directed      Increase activity slowly    Complete by:  As directed             Medication List    STOP taking these medications        cholecalciferol 1000 UNITS tablet  Commonly known as:  VITAMIN D     megestrol 40 MG/ML suspension  Commonly known as:  MEGACE      TAKE these medications        ALPRAZolam 1 MG tablet  Commonly known as:  XANAX  Take 0.5 mg by mouth 2 (two) times daily.     COMBIVENT RESPIMAT 20-100 MCG/ACT Aers respimat  Generic drug:  Ipratropium-Albuterol  Inhale 2 puffs into the lungs daily as needed for wheezing or shortness of breath.     dexamethasone 4 MG tablet  Commonly known as:  DECADRON  Take 1 tablet (4 mg total) by mouth daily.     dicyclomine 20 MG tablet  Commonly known as:  BENTYL  Take 20 mg by mouth 3 (three) times daily with meals.     DULoxetine 30 MG capsule  Commonly known as:  CYMBALTA  Take 1 capsule (30 mg total) by mouth daily.     feeding supplement (ENSURE ENLIVE) Liqd  Take 237 mLs  by mouth 2 (two) times daily between meals.     feeding supplement (RESOURCE BREEZE) Liqd  Take 1 Container by mouth 3 (three) times daily between meals.     fluconazole 100 MG tablet  Commonly known as:  DIFLUCAN  Take 1 tablet (100 mg total) by mouth daily.     folic acid 1 MG tablet  Commonly known as:  FOLVITE  Take 1 tablet (1 mg total) by mouth daily.     gabapentin 400 MG capsule  Commonly known as:  NEURONTIN  Take 400 mg by mouth 3 (three) times daily. Nerve pain     KLOR-CON M20 20 MEQ tablet  Generic drug:  potassium chloride SA  Take 20 mEq by mouth every morning. with food     loratadine 10 MG tablet  Commonly known as:  CLARITIN  Take 10 mg by mouth every morning.     magnesium hydroxide 400 MG/5ML suspension  Commonly known as:  MILK OF MAGNESIA  Take 5 mLs by mouth daily as needed for mild constipation.     metFORMIN 500 MG tablet  Commonly known as:  GLUCOPHAGE  Take 1 tablet by mouth 2 (two) times daily.     metoCLOPramide 5 MG tablet  Commonly known as:  REGLAN  Take 1 tablet (5 mg total) by mouth 3 (three) times daily before meals.     metoprolol tartrate 25 MG tablet  Commonly known as:  LOPRESSOR  Take 1 tablet by mouth daily.     MUSCLE RUB 10-15 % Crea  Apply 1 application topically daily as needed for muscle pain. Left shoulder pain     nicotine 21 mg/24hr patch  Commonly known as:  NICODERM CQ - dosed in mg/24 hours  Place 1 patch (21 mg total) onto the skin daily.     olmesartan 40 MG tablet  Commonly known as:  BENICAR  Take 1 tablet (40 mg total) by mouth daily.     omeprazole 20 MG capsule  Commonly known as:  PRILOSEC  Take 1 capsule (20 mg total) by mouth 2 (two) times daily before a meal.     ondansetron 8 MG disintegrating tablet  Commonly known as:  ZOFRAN-ODT  Take 1 tablet (8 mg total) by mouth every 6 (six)  hours as needed for nausea or vomiting.     OXcarbazepine 150 MG tablet  Commonly known as:  TRILEPTAL  Take 150  mg by mouth 3 (three) times daily.     OxyCODONE 20 mg T12a 12 hr tablet  Commonly known as:  OXYCONTIN  Take 3 tablets (60 mg total) by mouth 3 (three) times daily.     Oxycodone HCl 10 MG Tabs  Take 1-2 tablets (10-20 mg total) by mouth every 4 (four) hours as needed (moderate pain, severe pain, breakthrough pain).     polyethylene glycol packet  Commonly known as:  MIRALAX / GLYCOLAX  Take 17 g by mouth 2 (two) times daily.     senna-docusate 8.6-50 MG per tablet  Commonly known as:  Senokot-S  Take 2 tablets by mouth 2 (two) times daily.     simvastatin 20 MG tablet  Commonly known as:  ZOCOR  Take 20 mg by mouth at bedtime.     VITAMIN B-12 IJ  Inject as directed every 30 (thirty) days. Administered by Dr. York Ram around the 1st of each month     Vitamin D (Ergocalciferol) 50000 UNITS Caps capsule  Commonly known as:  DRISDOL  Take 50,000 Units by mouth every Monday.          The results of significant diagnostics from this hospitalization (including imaging, microbiology, ancillary and laboratory) are listed below for reference.    Significant Diagnostic Studies: Dg Chest 2 View  04/29/2015   CLINICAL DATA:  Lung cancer with osseous metastatic disease. Body aches.  EXAM: CHEST  2 VIEW  COMPARISON:  04/02/2015.  FINDINGS: The heart is enlarged. There is mild vascular congestion. Port-A-Cath tip good position SVC RA junction. Accentuation of RIGHT infrahilar markings is chronic.  The bones appear demineralized on the lateral radiograph, and are associated with rightward scoliosis. Metastatic disease is better visualized on PET scanning or cross-sectional imaging. Prior cervical fusion.  IMPRESSION: Widespread osseous metastatic disease better appreciated on PET scanning or cross-sectional imaging. No definite pathologic compression deformity. Cardiomegaly with mild vascular congestion. No acute chest findings are evident.   Electronically Signed   By: Rolla Flatten M.D.    On: 04/29/2015 02:28   Mr Thoracic Spine Wo Contrast  05/03/2015   CLINICAL DATA:  Back and abdominal pain. Lung cancer with bone metastases.  EXAM: MRI THORACIC AND LUMBAR SPINE WITHOUT CONTRAST  TECHNIQUE: Multiplanar and multiecho pulse sequences of the thoracic and lumbar spine were obtained without intravenous contrast.  COMPARISON:  PET-CT 04/01/2015.  Lumbar spine MRI 02/02/2015.  FINDINGS: MR THORACIC SPINE FINDINGS  Examination was ordered without IV contrast. Additionally, the patient could not tolerate any additional imaging due to pain.  Limited imaging of the cervical spine for localization purposes demonstrates sequelae of prior C4-C6 ACDF. Vertebral bone marrow signal is diffusely diminished on T1 weighted images and diffusely heterogeneous on T2 weighted images consistent with known widespread osseous metastases. Vertebral body heights are preserved without evidence of compression fracture. There is slight anterolisthesis of C7 on T1. There is mild thoracic dextroscoliosis. The thoracic spinal cord is normal in caliber and signal.  Slight bulging of the posterior T12 vertebral body is noted at the site of subtle ventral epidural tumor described on the prior lumbar spine MRI, better demonstrated on the prior study given the lack of axial imaging through the mid vertebral body level on the current examination. No epidural tumor is identified elsewhere on this unenhanced examination. No thoracic disc herniation, spinal  stenosis, or neural foraminal stenosis is identified. Paraspinal soft tissues are unremarkable.  MR LUMBAR SPINE FINDINGS  Trace retrolisthesis of L2 on L3 and grade 1 anterolisthesis of L5 on S1 are unchanged. Vertebral body heights are preserved without evidence of compression fracture. Sequelae of prior L5-S1 posterior and interbody fusion are again identified with unilateral left-sided pedicle screws and an interbody spacer in place.  Bone marrow signal is diffusely abnormal,  more so than on the prior MRI, now with complete replacement of the normal fatty marrow signal on T1 weighted images which may reflect a combination of worsening osseous metastatic disease as well as treatment related bone marrow changes. Vertebral bone marrow signal is diffusely heterogeneous on T2 weighted images with regions of both increased and decreased signal. A discrete rounded T2/STIR hyperintense lesion in the right aspect of the L5 vertebral body has increased in size, measuring 1.7 cm (previously 1.0 cm). There is diffuse involvement of the visualized sacrum and ilia. No epidural tumor is identified in the lumbar spine on this unenhanced examination.  Disc desiccation and mild-to-moderate disc space narrowing are again seen at L2-3 and L3-4. The Schmorl's node at L3-4 is unchanged. Conus medullaris is normal in signal and terminates at L1. Mild disc bulging and mild-to-moderate facet hypertrophy throughout the mid and lower lumbar spine do not appear significantly changed. There is no spinal stenosis. Paraspinal soft tissues are unremarkable.  IMPRESSION: 1. Diffuse osseous metastases, progressed from 02/02/2015. 2. Subtle ventral epidural tumor at T12 as seen on prior MRI. No substantial epidural tumor or cord compression. 3. No thoracic or lumbar spinal stenosis. Stable degenerative and postoperative changes in the lumbar spine.   Electronically Signed   By: Logan Bores   On: 05/03/2015 08:23   Mr Lumbar Spine Wo Contrast  05/03/2015   CLINICAL DATA:  Back and abdominal pain. Lung cancer with bone metastases.  EXAM: MRI THORACIC AND LUMBAR SPINE WITHOUT CONTRAST  TECHNIQUE: Multiplanar and multiecho pulse sequences of the thoracic and lumbar spine were obtained without intravenous contrast.  COMPARISON:  PET-CT 04/01/2015.  Lumbar spine MRI 02/02/2015.  FINDINGS: MR THORACIC SPINE FINDINGS  Examination was ordered without IV contrast. Additionally, the patient could not tolerate any additional  imaging due to pain.  Limited imaging of the cervical spine for localization purposes demonstrates sequelae of prior C4-C6 ACDF. Vertebral bone marrow signal is diffusely diminished on T1 weighted images and diffusely heterogeneous on T2 weighted images consistent with known widespread osseous metastases. Vertebral body heights are preserved without evidence of compression fracture. There is slight anterolisthesis of C7 on T1. There is mild thoracic dextroscoliosis. The thoracic spinal cord is normal in caliber and signal.  Slight bulging of the posterior T12 vertebral body is noted at the site of subtle ventral epidural tumor described on the prior lumbar spine MRI, better demonstrated on the prior study given the lack of axial imaging through the mid vertebral body level on the current examination. No epidural tumor is identified elsewhere on this unenhanced examination. No thoracic disc herniation, spinal stenosis, or neural foraminal stenosis is identified. Paraspinal soft tissues are unremarkable.  MR LUMBAR SPINE FINDINGS  Trace retrolisthesis of L2 on L3 and grade 1 anterolisthesis of L5 on S1 are unchanged. Vertebral body heights are preserved without evidence of compression fracture. Sequelae of prior L5-S1 posterior and interbody fusion are again identified with unilateral left-sided pedicle screws and an interbody spacer in place.  Bone marrow signal is diffusely abnormal, more so than on the  prior MRI, now with complete replacement of the normal fatty marrow signal on T1 weighted images which may reflect a combination of worsening osseous metastatic disease as well as treatment related bone marrow changes. Vertebral bone marrow signal is diffusely heterogeneous on T2 weighted images with regions of both increased and decreased signal. A discrete rounded T2/STIR hyperintense lesion in the right aspect of the L5 vertebral body has increased in size, measuring 1.7 cm (previously 1.0 cm). There is diffuse  involvement of the visualized sacrum and ilia. No epidural tumor is identified in the lumbar spine on this unenhanced examination.  Disc desiccation and mild-to-moderate disc space narrowing are again seen at L2-3 and L3-4. The Schmorl's node at L3-4 is unchanged. Conus medullaris is normal in signal and terminates at L1. Mild disc bulging and mild-to-moderate facet hypertrophy throughout the mid and lower lumbar spine do not appear significantly changed. There is no spinal stenosis. Paraspinal soft tissues are unremarkable.  IMPRESSION: 1. Diffuse osseous metastases, progressed from 02/02/2015. 2. Subtle ventral epidural tumor at T12 as seen on prior MRI. No substantial epidural tumor or cord compression. 3. No thoracic or lumbar spinal stenosis. Stable degenerative and postoperative changes in the lumbar spine.   Electronically Signed   By: Logan Bores   On: 05/03/2015 08:23   Ct Abdomen Pelvis W Contrast  04/30/2015   CLINICAL DATA:  Generalized body aches and uncontrolled pain.  EXAM: CT ABDOMEN AND PELVIS WITH CONTRAST  TECHNIQUE: Multidetector CT imaging of the abdomen and pelvis was performed using the standard protocol following bolus administration of intravenous contrast.  CONTRAST:  153m OMNIPAQUE IOHEXOL 300 MG/ML  SOLN  COMPARISON:  02/17/2015  FINDINGS: There are diffuse lytic and sclerotic skeletal metastases with progression since 02/17/2015. No pathologic fracture is evident.  No hepatic or adrenal metastases are evident. There are unremarkable appearances of the liver, gallbladder and bile ducts, except for mildly generous caliber the extrahepatic biliary system which is unchanged.  The pancreas, spleen, adrenals and kidneys appear unremarkable.  There is a small hiatal hernia. Bowel is otherwise unremarkable. There is no bowel obstruction. There is no focal inflammatory change in the abdomen or pelvis. There is no ascites.  Uterus and adnexal structures appear unremarkable.  .  There is no  significant abnormality in the lower chest.  IMPRESSION: 1. Diffuse skeletal metastases with disease progression since 02/17/2015. 2. No acute findings are evident in the abdomen or pelvis. No hepatic or adrenal metastases. 3. Small hiatal hernia.   Electronically Signed   By: DAndreas NewportM.D.   On: 04/30/2015 04:47    Microbiology: Recent Results (from the past 240 hour(s))  TECHNOLOGIST REVIEW     Status: None   Collection Time: 04/28/15  1:05 PM  Result Value Ref Range Status   Technologist Review Metas and Myelocytes present. 4% nrbcs  Final  Culture, blood (x 2)     Status: None   Collection Time: 04/30/15  8:40 AM  Result Value Ref Range Status   Specimen Description BLOOD LEFT FINGER  Final   Special Requests BOTTLES DRAWN AEROBIC ONLY 0.5 CC  Final   Culture   Final    NO GROWTH 5 DAYS Note: Culture results may be compromised due to an inadequate volume of blood received in culture bottles. Performed at [facility]   Report Status 05/06/2015 FINAL  Final  Culture, blood (x 2)     Status: None   Collection Time: 04/30/15 11:15 AM  Result Value  Ref Range Status   Specimen Description BLOOD RIGHT ARM  Final   Special Requests BOTTLES DRAWN AEROBIC ONLY 10 CC BLUE  Final   Culture   Final    NO GROWTH 5 DAYS Performed at [facility]    Report Status 05/06/2015 FINAL  Final     Labs: Basic Metabolic Panel:  Recent Labs Lab 04/30/15 0045 04/30/15 0915 05/01/15 0530 05/02/15 0359 05/06/15 0501  NA 137  --  142 142 142  K 2.9*  --  3.3* 4.2 3.2*  CL 100*  --  111 112* 105  CO2 21*  --  _0 GLUCOSE 203*  --  104* 99 95  BUN 16  --  _1 CREATININE 0.58  --  0.47 0.44 0.44  CALCIUM 9.5  --  8.8* 8.9 8.8*  MG  --  1.7  --   --   --    Liver Function Tests:  Recent Labs Lab 04/30/15 0045 05/01/15 0530 05/06/15 0501  AST _2 ALT 12* 9* 12*  ALKPHOS 558* 437* 512*  BILITOT 1.4* 0.8 0.4  PROT 7.6 5.9* 5.5*  ALBUMIN 3.8  3.1* 2.9*    Recent Labs Lab 04/30/15 0045  LIPASE 11*   No results for input(s): AMMONIA in the last 168 hours. CBC:  Recent Labs Lab 04/30/15 0045 05/01/15 0530 05/02/15 0359 05/06/15 0501  WBC 9.3 6.6 7.4 7.1  NEUTROABS 7.5  --   --   --   HGB 11.0* 8.1* 7.9* 7.0*  HCT 33.0* 24.7* 24.5* 22.2*  MCV 87.1 88.8 89.7 89.9  PLT 73* 57* 62* 42*   Cardiac Enzymes: No results for input(s): CKTOTAL, CKMB, CKMBINDEX, TROPONINI in the last 168 hours. BNP: BNP (last 3 results) No results for input(s): BNP in the last 8760 hours.  ProBNP (last 3 results) No results for input(s): PROBNP in the last 8760 hours.  CBG:  Recent Labs Lab 05/05/15 1636 05/05/15 2153 05/06/15 0743 05/06/15 1132 05/06/15 1712  GLUCAP 144* 162* 127* 117* 136*      Signed:  Vernell Leep, MD, FACP, FHM. [facility] Pager [phone]  If 7PM-7AM, please contact night-coverage [email or web address] Password [email or web address] 05/06/2015, 6:20 PM

## 2015-05-06 NOTE — Telephone Encounter (Signed)
I saw her in [facility] hospital again today, and addressed her pain meds. She will see me back next Tuesday  Truitt Merle  05/06/2015

## 2015-05-06 NOTE — Telephone Encounter (Signed)
Juliann Pulse CM said pt was possible discharge from hospital today. Juliann Pulse had information about the pt's oxycondone IR. Pt has enough supply at home to get through the weekend. Pt will need financial assistance for an increased dosage. She has had financial assistance via [facility] in the past. We need to expect a call from Dr Smitty Pluck with specifics of the new Rx.

## 2015-05-06 NOTE — Discharge Instructions (Signed)
Bone Metastases  Cancerous growths can begin in any part of the body. The original site of cancer is called the primary tumor or primary cancer (for example, breast cancer). After cancer has developed in one area of the body, cancerous cells from that area can break away and travel through the body's bloodstream. If these cancerous cells begin growing in another place in the body, they are called metastases. Bone metastases are cancer cells that have spread to the bone (which is different from a cancer that starts in the bone).  These secondary growths are like the original tumor. For example, if a prostate cancer spreads to bone it is called metastatic prostate cancer, or prostate cancer metastatic to bone, but not bone cancer. Cancers can spread to almost any bone; the spine and pelvis are often involved.   Any type of cancer can spread to the bone, but the most common are breast, lung, kidney, thyroid and prostate cancers. Sometimes the primary tumor is not discovered until there are bone problems. If the primary cancer location cannot be discovered, the cancer is called cancer of unknown primary location.  SYMPTOMS   Pain in the bones is the main symptom of bone metastases. Some other problems may occur first including:  · Decreased appetite.  · Nausea.  · Muscle weakness.  · Confusion.  · Unusual sleep patterns due to discomfort.  · Overly tired (fatigue).  · Restlessness.  Frail or brittle bones may lead to broken bones (fractures) that lead to learning what is wrong (diagnosis). A tumor often weakens the bones.   DIAGNOSIS   Metastatic cancers may be found months or years after or at the same time as the primary tumor. When a second tumor is found in a patient who has been treated for cancer, it is more often a metastasis than another primary tumor.   The patient's symptoms, physical examination, X-rays and blood tests may suggest a bone metastases. In addition, an examination of tissue or a cell sample  (biopsy) is usually done to find the cancer. This sample is removed with a needle. This tissue sample must be looked at under a microscope to confirm a diagnosis.  TREATMENT   Options generally include treatments that give relief from symptoms (palliative) or curative. Those with advanced, metastasized cancer may receive treatment focused on pain relief and prolonging life. These treatments depend on the type of cancer and its location.   Treatment for cancer depends on its type and location. Some of these treatments are:  · Surgery to remove the original tumor and/or to remove parts of the body that produce hormones and other chemicals that make cancer worse.  · Treatment with drugs (chemotherapy).  · Bone marrow transplantations on rare occasions.  · Radiation therapy (radiotherapy).  · Hormonal therapy.  · Pain relieving medications.  Your caregiver will help you understand the likelihood that any particular treatment will be helpful for you. While some treatments aim to cure or control the cancer, others give relief from symptoms only. If you have bone metastases, radiation therapy may be recommended to treat pain (if it is in one main location). Pain medications are available. These include strong medicines like morphine. You may be instructed to take a long-acting pain medication (to control most of your pain) and a short-acting medication to control occasional flares of pain. Pain medication is sometimes also given continuously through a pump.  HOME CARE INSTRUCTIONS   · Take medications exactly as prescribed.  · Keep any   follow-up appointments.  · Pain medications can make you sleepy or confused. Do not drive, climb ladders, or do other dangerous activities while on pain medication.  · Pain medications often cause constipation. Ask your caregiver for information on stool softeners.  · Do not share your pain medication with others.  SEEK MEDICAL CARE IF:   · Your bone pain is not controlled.  · You are having  problems or side effects from your medication.  · You have excessive sleepiness or confusion.  SEEK IMMEDIATE MEDICAL CARE IF:   · You fall and have any injury or pain from the fall.  · You have trouble walking.  · You have numbness or tingling in your legs.  · You develop a sudden significant worsening of your pain.  Document Released: 10/27/2002 Document Revised: 01/29/2012 Document Reviewed: 06/19/2008  ExitCare® Patient Information ©2015 [employer]. This information is not intended to replace advice given to you by your health care provider. Make sure you discuss any questions you have with your health care provider.

## 2015-05-06 NOTE — Care Management Note (Signed)
Case Management Note  Patient Details  Name: [name] [name] MRN: [id number] Date of Birth: [birth year]  Subjective/Objective: Checked med benefit for narcotic covered-codeine sulphate covered-[employer] $1.20,Fentanyl patch.Palliative Care MD updated & is NOT in agreement to these meds.She will speak directly to [place] office to update on narcotic patient will d/c on.Patient has enough narcotic med @ home until she goes to Cancer center.                   Action/Plan:d/c plan home.   Expected Discharge Date:                  Expected Discharge Plan:  Home/Self Care  In-House Referral:     Discharge planning Services  CM Consult  Post Acute Care Choice:    Choice offered to:     DME Arranged:    DME Agency:     HH Arranged:    [place]:     Status of Service:  Completed, signed off  Medicare Important Message Given:  Yes Date Medicare IM Given:  05/03/15 Medicare IM give by:  Dessa Phi Date Additional Medicare IM Given:  05/06/15 Additional Medicare Important Message give by:  Dessa Phi  If discussed at Long Length of Stay Meetings, dates discussed:    Additional Comments:  Dessa Phi, RN 05/06/2015, 12:27 PM

## 2015-05-06 NOTE — Care Management Note (Signed)
Case Management Note  Patient Details  Name: [name] [name] MRN: [id number] Date of Birth: 1951-01-01  Subjective/Objective:                    Action/Plan:   Expected Discharge Date:                  Expected Discharge Plan:  Home/Self Care  In-House Referral:     Discharge planning Services     Post Acute Care Choice:    Choice offered to:     DME Arranged:    DME Agency:     HH Arranged:    [place] Agency:     Status of Service:  In process, will continue to follow  Medicare Important Message Given:  Yes Date Medicare IM Given:  05/03/15 Medicare IM give by:  Dessa Phi Date Additional Medicare IM Given:    Additional Medicare Important Message give by:     If discussed at [place] of Stay Meetings, dates discussed:  05/06/15  Additional Comments:  Dessa Phi, RN 05/06/2015, 10:15 AM

## 2015-05-06 NOTE — Telephone Encounter (Signed)
VOICE MAIL FROM 12:43PM PER DR.ZEBA ANWAR WITH PALLIATIVE CARE. PT. IN ROOM 1418 AT [place] IS FOR POSSIBLE DISCHARGE TODAY. PT. IS TAKING OXYCONTIN '60MG'$  TID AND OXYIR '20MG'$  EVERY TWO HOURS PRN. SHE WILL NEED TO HAVE CONTINUING FINANCIAL ASSISTANCE FOR HER PAIN MEDICATION. PT. HAS ENOUGH PAIN MEDICATION THROUGH THE WEEKEND. PT.'S CASE MANAGER IS KATHY. DR.ANWAR'S PHONE NUMBER IS [phone]. PT.HAS NOT FOLLOW UP APPOINTMENT WITH DR.FENG. SHE HAS AN INFUSION APPOINTMENT ON 05/12/15.

## 2015-05-06 NOTE — Telephone Encounter (Signed)
per pof to sch pt appt-cld & spoke to pt-pt was in hospital stated she understood appt time

## 2015-05-06 NOTE — Care Management Note (Signed)
Case Management Note  Patient Details  Name: [name] [name] MRN: [id number] Date of Birth: 04/05/1951  Subjective/Objective:  Checked patient's insurance benefit for oxycodone/oxycontin-no coverage;out of pocket cost-$275/$835.Left vm w/Dr. Gearldine Shown nurse-checking on Cancer related pain resource for med coverage.Await call back.                 Action/Plan:d/c plan home.   Expected Discharge Date:                  Expected Discharge Plan:  Home/Self Care  In-House Referral:     Discharge planning Services     Post Acute Care Choice:    Choice offered to:     DME Arranged:    DME Agency:     HH Arranged:    HH Agency:     Status of Service:  In process, will continue to follow  Medicare Important Message Given:  Yes Date Medicare IM Given:  05/03/15 Medicare IM give by:  Dessa Phi Date Additional Medicare IM Given:    Additional Medicare Important Message give by:     If discussed at [place] of Stay Meetings, dates discussed:    Additional Comments:  Dessa Phi, RN 05/06/2015, 9:36 AM

## 2015-05-06 NOTE — Progress Notes (Signed)
Daily Progress Note   Patient Name: [name] [name]       Date: 05/06/2015 DOB: 12-31-1950  Age: 64 y.o. MRN#: [id number] Attending Physician: Modena Jansky, MD Primary Care Physician: Harvie Junior, MD Admit Date: 04/29/2015  Reason for Consultation/Follow-up: Establishing goals of care and Pain control  Subjective: Pain well controlled, patient is awake alert resting in bed She had a good bowel movement within the past 24 hours She reports her appetite is improving She endorses hope and optimism regarding her condition, she wishes to discuss treatment options with her oncologist when she is d/C  She has OxyContin and Oxycodone PO tablets available at home, she received them through assistance from the cancer center. She has around 90-120 tablets of both these medications.   Current dosages deemed appropriate for continuation upon discharge are: oxycontin to '60mg'$  TID and Oxycodone IR '20mg'$  PRN (allow q2h PRN).    Call placed and message left for cancer center about the current dosages of both these medications.    Length of Stay: 6 days  Current Medications: Scheduled Meds:  . sodium chloride   Intravenous Once  . ALPRAZolam  0.5 mg Oral TID  . cholecalciferol  1,000 Units Oral Daily  . dexamethasone  4 mg Oral Daily  . dicyclomine  20 mg Oral TID WC  . DULoxetine  30 mg Oral Daily  . feeding supplement (ENSURE ENLIVE)  237 mL Oral BID BM  . feeding supplement (RESOURCE BREEZE)  1 Container Oral TID BM  . fluconazole  100 mg Oral Daily  . folic acid  1 mg Oral Daily  . gabapentin  400 mg Oral TID  . insulin aspart  0-9 Units Subcutaneous TID WC  . irbesartan  300 mg Oral Daily  . Linaclotide  145 mcg Oral Daily  . loratadine  10 mg Oral q morning - 10a  . metoCLOPramide  5 mg Oral TID AC  . metoprolol tartrate  25 mg Oral Daily  . nicotine  21 mg Transdermal Daily  . OXcarbazepine  150 mg Oral TID  . OxyCODONE  60 mg Oral 3 times per day  . pantoprazole  40 mg  Oral Daily  . polyethylene glycol  17 g Oral BID  . senna-docusate  2 tablet Oral BID  . simvastatin  20 mg Oral QHS  . sodium chloride  3 mL Intravenous Q12H    Continuous Infusions:    PRN Meds: bisacodyl, hydrALAZINE, HYDROmorphone (DILAUDID) injection, hydrOXYzine, ipratropium-albuterol, magnesium hydroxide, MUSCLE RUB, ondansetron, oxyCODONE, promethazine, sodium chloride    Vital Signs: BP 153/88 mmHg  Pulse 93  Temp(Src) 98.6 F (37 C) (Oral)  Resp 20  Ht '5\' 4"'$  (1.626 m)  Wt 55.929 kg (123 lb 4.8 oz)  BMI 21.15 kg/m2  SpO2 95% SpO2: SpO2: 95 % O2 Device: O2 Device: Not Delivered O2 Flow Rate:    Intake/output summary: No intake or output data in the 24 hours ending 05/06/15 1245 Baseline Weight: Weight: 55.929 kg (123 lb 4.8 oz) Most recent weight: Weight: 55.929 kg (123 lb 4.8 oz)  Physical Exam: GEN: alert, NAD HEENT: [place], sclera anciteric CV: S1 S2 Non focal Some what flat affect at times.          Additional Data Reviewed: Recent Labs     05/06/15  0501  WBC  7.1  HGB  7.0*  PLT  42*  NA  142  BUN  10  CREATININE  0.44     Problem List:  Patient  Active Problem List   Diagnosis Date Noted  . Neuropathic pain   . Constipation due to opioid therapy   . Anxiety and depression   . Lactic acidosis   . SIRS (systemic inflammatory response syndrome) 04/30/2015  . Malnutrition of moderate degree 04/30/2015  . Dehydration   . Hypokalemia   . GERD (gastroesophageal reflux disease)   . Chronic pain   . AP (abdominal pain)   . Protein-calorie malnutrition, severe 04/04/2015  . Nausea and vomiting 04/02/2015  . Nausea & vomiting 04/02/2015  . Left lateral abdominal pain 04/02/2015  . DM gastroparesis 04/02/2015  . Generalized anxiety disorder 04/02/2015  . Anxiety state   . Palliative care encounter   . Depression   . CN (constipation)   . Metastatic cancer to bone   . Metastatic adenocarcinoma 03/08/2015  . Nausea with vomiting 03/08/2015  .  Cancer associated pain 03/08/2015  . Osseous metastasis 02/17/2015  . Uncontrolled pain 02/17/2015  . Metastatic primary lung cancer 02/11/2015  . Radiculopathy 03/05/2014  . Intermittent diarrhea 02/07/2014  . Spinal stenosis of lumbar region L L%-S1 08/10/2013  . Other and unspecified noninfectious gastroenteritis and colitis(558.9) 05/27/2013  . Abdominal pain 05/26/2013  . Hepatitis C 05/26/2013  . Hepatitis B 05/26/2013  . Hypertension 05/26/2013  . Diabetes mellitus 05/26/2013  . Nausea vomiting and diarrhea 03/08/2013  . Abdominal pain, epigastric 03/08/2013  . Enteritis due to Clostridium difficile 01/02/2013  . Tachycardia 12/30/2012  . Bipolar 1 disorder 12/29/2012  . S/P thoracotomy 12/10/2012  . Bipolar disorder 12/08/2012  . Anxiety disorder 12/08/2012  . Lung cancer, right upper lobe 11/07/2012     Palliative Care Assessment & Plan  64 yo female with PMHx of stage IV NSCLC admitted with abdominal pain, N/V  Code Status:  Full code  Goals of Care:   continue symptom management, out patient follow up with oncology.   3. Symptom Management:  Cancer related pain- well maintained on current regimen of oxycontin to '60mg'$  TID and continue '20mg'$  PRN (allow q2h PRN).        Constipation- moving bowels continue regimen  Neuropathic pain- related to DM and perhaps from spinal disease. cymbalta. Continue neurontin. Consider increase in cymbalta as outpatient  Anxiety/Depression- on BID xanax. Cautious with bezo's and high dose opioids. Added cymbalta as above  N/V- relieved with zofran.   Pruritis- a lot she feels is driven by anxiety. Reports relief from Atarax, to continue.   5. Prognosis: Unable to determine  5. Discharge Planning: Home with [place] was discussed with patient, Dr Algis Liming and case management Juliann Pulse.   Thank you for allowing the Palliative Medicine Team to assist in the care of this patient.   Total Time; 35  minutes Greater than 50%  of this time was spent counseling and coordinating care related to the above assessment and plan.   Loistine Chance, MD  05/06/2015, 12:45 PM  Please contact Palliative Medicine Team phone at [phone] for questions and concerns.

## 2015-05-07 ENCOUNTER — Encounter: Payer: Self-pay | Admitting: Hematology

## 2015-05-07 DIAGNOSIS — K5909 Other constipation: Secondary | ICD-10-CM

## 2015-05-07 DIAGNOSIS — T402X5A Adverse effect of other opioids, initial encounter: Secondary | ICD-10-CM

## 2015-05-07 DIAGNOSIS — C3410 Malignant neoplasm of upper lobe, unspecified bronchus or lung: Secondary | ICD-10-CM

## 2015-05-07 DIAGNOSIS — F411 Generalized anxiety disorder: Secondary | ICD-10-CM

## 2015-05-07 DIAGNOSIS — F418 Other specified anxiety disorders: Secondary | ICD-10-CM

## 2015-05-07 DIAGNOSIS — Z515 Encounter for palliative care: Secondary | ICD-10-CM

## 2015-05-07 DIAGNOSIS — E872 Acidosis: Secondary | ICD-10-CM

## 2015-05-07 DIAGNOSIS — E876 Hypokalemia: Secondary | ICD-10-CM

## 2015-05-07 DIAGNOSIS — F319 Bipolar disorder, unspecified: Secondary | ICD-10-CM

## 2015-05-07 LAB — BASIC METABOLIC PANEL
Anion gap: 9 (ref 5–15)
BUN: 16 mg/dL (ref 6–20)
CO2: 28 mmol/L (ref 22–32)
Calcium: 8.8 mg/dL — ABNORMAL LOW (ref 8.9–10.3)
Chloride: 103 mmol/L (ref 101–111)
Creatinine, Ser: 0.59 mg/dL (ref 0.44–1.00)
GFR calc Af Amer: >60 mL/min (ref >60)
GFR calc non Af Amer: >60 mL/min (ref >60)
Glucose, Bld: 123 mg/dL — ABNORMAL HIGH (ref 65–99)
Potassium: 3 mmol/L — ABNORMAL LOW (ref 3.5–5.1)
SODIUM: 140 mmol/L (ref 135–145)

## 2015-05-07 LAB — TYPE AND SCREEN
ABO/RH(D): O POS
Antibody Screen: NEGATIVE
Unit division: 0

## 2015-05-07 LAB — CBC
HCT: 26.3 % — ABNORMAL LOW (ref 36.0–46.0)
Hemoglobin: 8.5 g/dL — ABNORMAL LOW (ref 12.0–15.0)
MCH: 28.6 pg (ref 26.0–34.0)
MCHC: 32.3 g/dL (ref 30.0–36.0)
MCV: 88.6 fL (ref 78.0–100.0)
PLATELETS: 35 10*3/uL — AB (ref 150–400)
RBC: 2.97 MIL/uL — AB (ref 3.87–5.11)
RDW: 16.6 % — ABNORMAL HIGH (ref 11.5–15.5)
WBC: 7.8 10*3/uL (ref 4.0–10.5)

## 2015-05-07 LAB — ABO/RH: ABO/RH(D): O POS

## 2015-05-07 LAB — GLUCOSE, CAPILLARY: GLUCOSE-CAPILLARY: 135 mg/dL — AB (ref 65–99)

## 2015-05-07 MED ORDER — HEPARIN SOD (PORK) LOCK FLUSH 100 UNIT/ML IV SOLN
500.0000 [IU] | INTRAVENOUS | Status: DC
  Filled 2015-05-07: qty 5

## 2015-05-07 MED ORDER — HEPARIN SOD (PORK) LOCK FLUSH 100 UNIT/ML IV SOLN
500.0000 [IU] | INTRAVENOUS | Status: DC | PRN
  Administered 2015-05-07: 500 [IU]
  Filled 2015-05-07: qty 5

## 2015-05-07 NOTE — Progress Notes (Signed)
I sent note to nurses. The patient doesn't have "financial asst" with [place]. I did prior auth for her oxycontin.

## 2015-05-07 NOTE — Progress Notes (Signed)
[facility] PROGRESS NOTE   [name] [name] TOI:[id number] DOB: December 24, 1950 DOA: 04/29/2015 PCP: Harvie Junior, MD  HPI/Subjective:  Denies any new complaints, wants to go home.  Assessment/Plan: Principal Problem:   Abdominal pain Active Problems:   Lung cancer, right upper lobe   Anxiety disorder   Bipolar 1 disorder   Hepatitis C   Hepatitis B   Hypertension   Diabetes mellitus   Spinal stenosis of lumbar region L L%-S1   Metastatic primary lung cancer   Osseous metastasis   Metastatic cancer to bone   Depression   Palliative care encounter   Generalized anxiety disorder   Protein-calorie malnutrition, severe   AP (abdominal pain)   SIRS (systemic inflammatory response syndrome)   Hypokalemia   GERD (gastroesophageal reflux disease)   Malnutrition of moderate degree   Chronic pain   Lactic acidosis   Neuropathic pain   Constipation due to opioid therapy   Anxiety and depression   Encounter for palliative care   Patient seen and examined, data base reviewed. Discharge paperwork which done since yesterday. Patient seen in the night in the hospital because of late blood transfusion has to stay till the morning. Hemoglobin improved from 7.0 up to 8.5 today. Consider Relistor or Movantik for narcotics induced constipation. Even 1 dose of potassium prior to discharge. Follow BMP as outpatient.  Code Status: Full Code Family Communication: Plan discussed with the patient. Disposition Plan: Remains inpatient Diet: Diet regular Room service appropriate?: Yes; Fluid consistency:: Thin Diet - low sodium heart healthy Diet Carb Modified Diet - low sodium heart healthy  Consultants:  Hematology oncology  Procedures:  None  Antibiotics:  None   Objective: Filed Vitals:   05/07/15 0511  BP: 170/88  Pulse: 102  Temp: 98.7 F (37.1 C)  Resp: 20    Intake/Output Summary (Last 24 hours) at 05/07/15 1223 Last data filed at 05/07/15 1134  Gross  per 24 hour  Intake   1075 ml  Output      0 ml  Net   1075 ml   Filed Weights   04/30/15 0900  Weight: 55.929 kg (123 lb 4.8 oz)    Exam: General: Alert and awake, oriented x3, not in any acute distress. HEENT: anicteric sclera, pupils reactive to light and accommodation, EOMI CVS: S1-S2 clear, no murmur rubs or gallops Chest: clear to auscultation bilaterally, no wheezing, rales or rhonchi Abdomen: soft nontender, nondistended, normal bowel sounds, no organomegaly Extremities: no cyanosis, clubbing or edema noted bilaterally Neuro: Cranial nerves II-XII intact, no focal neurological deficits  Data Reviewed: Basic Metabolic Panel:  Recent Labs Lab 05/01/15 0530 05/02/15 0359 05/06/15 0501 05/07/15 0430  NA 142 142 142 140  K 3.3* 4.2 3.2* 3.0*  CL 111 112* 105 103  CO2 '23 24 28 28  '$ GLUCOSE 104* 99 95 123*  BUN '18 14 10 16  '$ CREATININE 0.47 0.44 0.44 0.59  CALCIUM 8.8* 8.9 8.8* 8.8*   Liver Function Tests:  Recent Labs Lab 05/01/15 0530 05/06/15 0501  AST 16 25  ALT 9* 12*  ALKPHOS 437* 512*  BILITOT 0.8 0.4  PROT 5.9* 5.5*  ALBUMIN 3.1* 2.9*   No results for input(s): LIPASE, AMYLASE in the last 168 hours. No results for input(s): AMMONIA in the last 168 hours. CBC:  Recent Labs Lab 05/01/15 0530 05/02/15 0359 05/06/15 0501 05/06/15 2035 05/07/15 0430  WBC 6.6 7.4 7.1 7.9 7.8  HGB 8.1* 7.9* 7.0* 8.7* 8.5*  HCT 24.7* 24.5* 22.2* 26.9* 26.3*  MCV 88.8 89.7 89.9 90.6 88.6  PLT 57* 62* 42* 38* 35*   Cardiac Enzymes: No results for input(s): CKTOTAL, CKMB, CKMBINDEX, TROPONINI in the last 168 hours. BNP (last 3 results) No results for input(s): BNP in the last 8760 hours.  ProBNP (last 3 results) No results for input(s): PROBNP in the last 8760 hours.  CBG:  Recent Labs Lab 05/06/15 0743 05/06/15 1132 05/06/15 1712 05/06/15 2106 05/07/15 0748  GLUCAP 127* 117* 136* 147* 135*    Micro Recent Results (from the past 240 hour(s))    TECHNOLOGIST REVIEW     Status: None   Collection Time: 04/28/15  1:05 PM  Result Value Ref Range Status   Technologist Review Metas and Myelocytes present. 4% nrbcs  Final  Culture, blood (x 2)     Status: None   Collection Time: 04/30/15  8:40 AM  Result Value Ref Range Status   Specimen Description BLOOD LEFT FINGER  Final   Special Requests BOTTLES DRAWN AEROBIC ONLY 0.5 CC  Final   Culture   Final    NO GROWTH 5 DAYS Note: Culture results may be compromised due to an inadequate volume of blood received in culture bottles. Performed at [facility]    Report Status 05/06/2015 FINAL  Final  Culture, blood (x 2)     Status: None   Collection Time: 04/30/15 11:15 AM  Result Value Ref Range Status   Specimen Description BLOOD RIGHT ARM  Final   Special Requests BOTTLES DRAWN AEROBIC ONLY 10 CC BLUE  Final   Culture   Final    NO GROWTH 5 DAYS Performed at [facility]    Report Status 05/06/2015 FINAL  Final     Studies: No results found.  Scheduled Meds: . sodium chloride   Intravenous Once  . ALPRAZolam  0.5 mg Oral TID  . cholecalciferol  1,000 Units Oral Daily  . dexamethasone  4 mg Oral Daily  . dicyclomine  20 mg Oral TID WC  . DULoxetine  30 mg Oral Daily  . feeding supplement (ENSURE ENLIVE)  237 mL Oral BID BM  . feeding supplement (RESOURCE BREEZE)  1 Container Oral TID BM  . fluconazole  100 mg Oral Daily  . folic acid  1 mg Oral Daily  . gabapentin  400 mg Oral TID  . heparin lock flush  500 Units Intracatheter Q30 days  . insulin aspart  0-9 Units Subcutaneous TID WC  . irbesartan  300 mg Oral Daily  . Linaclotide  145 mcg Oral Daily  . loratadine  10 mg Oral q morning - 10a  . metoCLOPramide  5 mg Oral TID AC  . metoprolol tartrate  25 mg Oral Daily  . nicotine  21 mg Transdermal Daily  . OXcarbazepine  150 mg Oral TID  . OxyCODONE  60 mg Oral 3 times per day  . pantoprazole  40 mg Oral Daily  . polyethylene glycol  17 g Oral BID   . senna-docusate  2 tablet Oral BID  . simvastatin  20 mg Oral QHS  . sodium chloride  3 mL Intravenous Q12H   Continuous Infusions:      Time spent: 35 minutes    [facility] A  [facility] Pager [phone] If 7PM-7AM, please contact night-coverage at [email or web address], password [facility] 05/07/2015, 12:23 PM  LOS: 7 days

## 2015-05-12 ENCOUNTER — Ambulatory Visit: Payer: Self-pay | Admitting: Hematology

## 2015-05-12 ENCOUNTER — Other Ambulatory Visit: Payer: Self-pay

## 2015-05-12 ENCOUNTER — Ambulatory Visit: Payer: Self-pay

## 2015-05-19 ENCOUNTER — Ambulatory Visit: Payer: Self-pay

## 2015-05-19 ENCOUNTER — Telehealth: Payer: Self-pay | Admitting: Hematology

## 2015-05-19 ENCOUNTER — Encounter (HOSPITAL_COMMUNITY): Payer: Self-pay

## 2015-05-19 NOTE — Telephone Encounter (Signed)
Returned call to patient daughter [name] at [phone] regarding getting her mom back on schedule for missed appointments. Left message for daughter informing her that Dr. Burr Medico is out of the office next week and the next available appointments is 06/07/15. Asked that daughter return call letting me know if 06/07/15 is ok.

## 2015-05-20 ENCOUNTER — Other Ambulatory Visit: Payer: Self-pay | Admitting: *Deleted

## 2015-05-20 ENCOUNTER — Ambulatory Visit (HOSPITAL_BASED_OUTPATIENT_CLINIC_OR_DEPARTMENT_OTHER): Payer: Medicare HMO

## 2015-05-20 ENCOUNTER — Encounter: Payer: Self-pay | Admitting: Hematology

## 2015-05-20 ENCOUNTER — Telehealth: Payer: Self-pay | Admitting: *Deleted

## 2015-05-20 ENCOUNTER — Ambulatory Visit (HOSPITAL_BASED_OUTPATIENT_CLINIC_OR_DEPARTMENT_OTHER): Payer: Medicare HMO | Admitting: Hematology

## 2015-05-20 VITALS — BP 98/56 | HR 113 | Temp 99.4°F | Resp 18 | Ht 64.0 in | Wt 119.1 lb

## 2015-05-20 DIAGNOSIS — C3410 Malignant neoplasm of upper lobe, unspecified bronchus or lung: Secondary | ICD-10-CM

## 2015-05-20 DIAGNOSIS — D649 Anemia, unspecified: Secondary | ICD-10-CM | POA: Diagnosis not present

## 2015-05-20 DIAGNOSIS — C7951 Secondary malignant neoplasm of bone: Secondary | ICD-10-CM | POA: Diagnosis not present

## 2015-05-20 DIAGNOSIS — C3411 Malignant neoplasm of upper lobe, right bronchus or lung: Secondary | ICD-10-CM | POA: Diagnosis not present

## 2015-05-20 DIAGNOSIS — G893 Neoplasm related pain (acute) (chronic): Secondary | ICD-10-CM | POA: Diagnosis not present

## 2015-05-20 DIAGNOSIS — C3490 Malignant neoplasm of unspecified part of unspecified bronchus or lung: Secondary | ICD-10-CM

## 2015-05-20 DIAGNOSIS — D696 Thrombocytopenia, unspecified: Secondary | ICD-10-CM

## 2015-05-20 LAB — TECHNOLOGIST REVIEW

## 2015-05-20 LAB — COMPREHENSIVE METABOLIC PANEL (CC13)
ALBUMIN: 2.9 g/dL — AB (ref 3.5–5.0)
ALT: 9 U/L (ref 0–55)
AST: 16 U/L (ref 5–34)
Alkaline Phosphatase: 531 U/L — ABNORMAL HIGH (ref 40–150)
Anion Gap: 10 mEq/L (ref 3–11)
BUN: 25.6 mg/dL (ref 7.0–26.0)
CO2: 28 mEq/L (ref 22–29)
Calcium: 9.1 mg/dL (ref 8.4–10.4)
Chloride: 100 mEq/L (ref 98–109)
Creatinine: 1 mg/dL (ref 0.6–1.1)
EGFR: 65 mL/min/{1.73_m2} — AB (ref >90)
GLUCOSE: 105 mg/dL (ref 70–140)
Potassium: 4.1 mEq/L (ref 3.5–5.1)
SODIUM: 138 meq/L (ref 136–145)
TOTAL PROTEIN: 5.8 g/dL — AB (ref 6.4–8.3)
Total Bilirubin: 0.36 mg/dL (ref 0.20–1.20)

## 2015-05-20 LAB — CBC WITH DIFFERENTIAL/PLATELET
BASO%: 0.6 % (ref 0.0–2.0)
BASOS ABS: 0 10*3/uL (ref 0.0–0.1)
EOS%: 0.6 % (ref 0.0–7.0)
Eosinophils Absolute: 0 10*3/uL (ref 0.0–0.5)
HCT: 22.7 % — ABNORMAL LOW (ref 34.8–46.6)
HEMOGLOBIN: 7.1 g/dL — AB (ref 11.6–15.9)
LYMPH#: 1 10*3/uL (ref 0.9–3.3)
LYMPH%: 19.9 % (ref 14.0–49.7)
MCH: 28.7 pg (ref 25.1–34.0)
MCHC: 31.3 g/dL — ABNORMAL LOW (ref 31.5–36.0)
MCV: 91.9 fL (ref 79.5–101.0)
MONO#: 0.5 10*3/uL (ref 0.1–0.9)
MONO%: 9.2 % (ref 0.0–14.0)
NEUT%: 69.7 % (ref 38.4–76.8)
NEUTROS ABS: 3.4 10*3/uL (ref 1.5–6.5)
NRBC: 14 % — AB (ref 0–0)
PLATELETS: 26 10*3/uL — AB (ref 145–400)
RBC: 2.47 10*6/uL — ABNORMAL LOW (ref 3.70–5.45)
RDW: 17.3 % — ABNORMAL HIGH (ref 11.2–14.5)
WBC: 4.9 10*3/uL (ref 3.9–10.3)

## 2015-05-20 MED ORDER — OXYCODONE HCL ER 20 MG PO T12A: 60.0000 mg | EXTENDED_RELEASE_TABLET | Freq: Three times a day (TID) | ORAL | Status: DC

## 2015-05-20 MED ORDER — OXYCODONE HCL 10 MG PO TABS: 10.0000 mg | ORAL_TABLET | ORAL | Status: DC | PRN

## 2015-05-20 MED ORDER — ALPRAZOLAM 1 MG PO TABS: 0.5000 mg | ORAL_TABLET | Freq: Every day | ORAL | Status: DC

## 2015-05-20 NOTE — Progress Notes (Signed)
.  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   02/24/2015 Imaging CT chest, abdomen and pelvis showed diffuse bone metastasis throughout the whole spine, sternum, and some of the ribs, skull, and pelvis. brain MRI was negative for metastasis   03/04/2015 Pathology Results left iliac crest lytic bone lesion biopsy showed metastatic adenocarcinoma with abundant mucin, the morphology and IHC are consistent with her prior lung adenocarcinoma.   03/08/2015 - 03/12/2015 Hospital Admission she was admitted for intractable nausea, vomiting, and worsening pain. She was seen by radiation oncologist Dr. Sondra Come during her stay, and her pain medication was adjusted.   03/19/2015 - 04/07/2015 Radiation Therapy palliative radiation to left pelvis, 30 gray in 10 fractions   03/27/2015 Miscellaneous her tumor PD-L1 IHC (-)    Metastatic primary lung cancer   02/11/2015 Initial Diagnosis Metastatic primary lung cancer     HISTORY OF PRESENTING ILLNESS:  [name] [name] 64 y.o. female  with multiple comorbidities including untreatedihepatitis C , stage 1 right lung cancer status post lobectomy in 2014 , who was referred to our clinic because of  her recent abnormal MRI lumbar spine findings, which is highly suspicious for metastatic cancer.  She had lumber spine infusion and screws placement in sep 2014 for her back pain, which improved  after surgery but she has had some residual back pain since then but was tolerable. She reprots worseing back for the past 4-5 month, and it bacame unbearable in the past few weeks and she presented to ED on 02/02/15. Lumber MRI was done which showed diffuse bone lesions which was highly syuspecious for metastases. She also developed worsening left leg weakness in the past 2 months, able to walk independently, and mild numbness at left foot. She has been limping lately due to the back pain, no urine or stool incontinuce. She takes oxycodone twice daily which was prescribed by her primary care physician.    She lost some of appetie, lost 15 lb in the last year, No fever or chills. (+) cough with yellow sputum, and some chest discomfort, no dyspnea on exertion.  She lives with her son, she is able to take care of herself, but not much else. She spends most of time sitting or laying down during daytime.   Her last mammogram was one years ago, last colonoscopy one years ago,  which were all normal normal per patient.   INTERIM HISTORY: She returns for follow-up. She did not show for her appointment early this week, and her daughter called for appointment today because she is running out of her pain medication. She will resume admitted to [facility] in on 04/29/2015 for abdominal pain and nausea. She was discharged home on 05/07/2015. She states she has been doing better since her hospital discharge. She has moderate fatigue, able to take care of herself at home. She has persistent moderate pelvic and low back pain. She is taking OxyContin 40 mg twice a day and oxycodone as needed.  She denies any bleeding. No significant dyspnea. Her daughter reports she had surgery speech on several occasion, resolved spontaneously. She denies any focal new symptoms.  MEDICAL HISTORY:  Past Medical History  Diagnosis Date  . Hypertension   . Bipolar 1 disorder   . Heart murmur   . Sciatica   . Arthritis   .  Anxiety   . Diverticulosis   . Chronic low back pain   . DDD (degenerative disc disease), lumbar   . Chronic shoulder pain   . Chronic neck pain   . Hepatitis B     Unclear when initially diagnosed, labs in Epic from 03/09/13  . Hepatitis C     Unclear when initially diagnosed, labs in Epic from 03/09/13  . C. difficile diarrhea     April and February 2014  . COPD (chronic obstructive pulmonary disease)   . GERD (gastroesophageal reflux disease)   . H/O hiatal hernia   . Headache(784.0)     hx  . PONV (postoperative nausea and vomiting)   . Seizures     on meds  . Diabetes mellitus   . Adenocarcinoma of lung, stage 1   . GERD (gastroesophageal reflux disease)     SURGICAL HISTORY: Past Surgical History  Procedure Laterality Date  . Facial tumor removal Right 2000  . Brain tumor removal  09  . Brain surgery      in [place]  . Video bronchoscopy  12/04/2012    Procedure: VIDEO BRONCHOSCOPY;  Surgeon: Grace Isaac, MD;  Location: [facility] OR;  Service: Thoracic;  Laterality: N/A;  . Video assisted thoracoscopy (vats)/wedge resection  12/04/2012    Procedure: VIDEO ASSISTED THORACOSCOPY (VATS)/WEDGE RESECTION;  Surgeon: Grace Isaac, MD;  Location: [place];  Service: Thoracic;  Laterality: Right;  . Lobectomy  12/04/2012    Procedure: LOBECTOMY;  Surgeon: Grace Isaac, MD;  Location: [place];  Service: Thoracic;  Laterality: Right;  completion of right upper lobectomy and lymph node disection, placement of on q pump  . Tubal ligation    . Tonsillectomy    . Anterior cervical decomp/discectomy fusion N/A 03/05/2014    Procedure: ANTERIOR CERVICAL DECOMPRESSION/DISCECTOMY FUSION 2 LEVELS;  Surgeon: Sinclair Ship, MD;  Location: [place];  Service: Orthopedics;  Laterality: N/A;  Anterior cervical decompression fusion, cervical 4-5, cervical 5-6 with instrumentation, allograft.    SOCIAL HISTORY: History   Social History  . Marital Status: Widowed    Spouse Name:  N/A  . Number of Children: 5  . Years of Education: N/A   Occupational History  . n/a     patient draws [employer]   Social History Main Topics  . Smoking status: Former Smoker -- 0.25 packs/day for 10 years    Types: Cigarettes    Quit date: 07/07/2013  . Smokeless tobacco: Never Used     Comment: Smokes pk q 3 days   marajuna aug  . Alcohol Use: No     Comment: quit 55yr ago  . Drug Use: Yes    Special: Hydrocodone, Marijuana     Comment: "maybe about once a month"  . Sexual Activity: No   Other Topics Concern  . Not on file   Social History Narrative    FAMILY HISTORY: Family History  Problem Relation Age of Onset  . Hypertension Father     deceased  . Diabetes Father   . Heart disease Father   . Hyperlipidemia Father   . Hypertension Mother   .  Hyperlipidemia Mother   . Diabetes Mother   . Hypertension Sister   . Hyperlipidemia Sister   . Hypertension Brother   . Hyperlipidemia Brother     ALLERGIES:  is allergic to haldol; acetaminophen; ibuprofen; metronidazole; morphine and related; and penicillins.  MEDICATIONS:  Current Outpatient Prescriptions  Medication Sig Dispense Refill  . ALPRAZolam (XANAX) 1 MG tablet Take 0.5 tablets (0.5 mg total) by mouth daily. 30 tablet 0  . Cyanocobalamin (VITAMIN B-12 IJ) Inject as directed every 30 (thirty) days. Administered by Dr. York Ram around the 1st of each month    . dexamethasone (DECADRON) 4 MG tablet Take 1 tablet (4 mg total) by mouth daily. 30 tablet 2  . dicyclomine (BENTYL) 20 MG tablet Take 20 mg by mouth 3 (three) times daily with meals.    . DULoxetine (CYMBALTA) 30 MG capsule Take 1 capsule (30 mg total) by mouth daily. 30 capsule 0  . feeding supplement, ENSURE ENLIVE, (ENSURE ENLIVE) LIQD Take 237 mLs by mouth 2 (two) times daily between meals.    . folic acid (FOLVITE) 1 MG tablet Take 1 tablet (1 mg total) by mouth daily. 30 tablet 3  . gabapentin (NEURONTIN) 400 MG capsule Take 400 mg by  mouth 3 (three) times daily. Nerve pain    . Ipratropium-Albuterol (COMBIVENT RESPIMAT) 20-100 MCG/ACT AERS respimat Inhale 2 puffs into the lungs daily as needed for wheezing or shortness of breath.     Marland Kitchen KLOR-CON M20 20 MEQ tablet Take 20 mEq by mouth every morning. with food  6  . loratadine (CLARITIN) 10 MG tablet Take 10 mg by mouth every morning.     . magnesium hydroxide (MILK OF MAGNESIA) 400 MG/5ML suspension Take 5 mLs by mouth daily as needed for mild constipation. 118 mL 0  . Menthol-Methyl Salicylate (MUSCLE RUB) 10-15 % CREA Apply 1 application topically daily as needed for muscle pain. Left shoulder pain    . metFORMIN (GLUCOPHAGE) 500 MG tablet Take 1 tablet by mouth 2 (two) times daily.  6  . metoCLOPramide (REGLAN) 5 MG tablet Take 1 tablet (5 mg total) by mouth 3 (three) times daily before meals. 30 tablet 2  . metoprolol tartrate (LOPRESSOR) 25 MG tablet Take 1 tablet by mouth daily.    Marland Kitchen olmesartan (BENICAR) 40 MG tablet Take 1 tablet (40 mg total) by mouth daily. 30 tablet 0  . omeprazole (PRILOSEC) 20 MG capsule Take 1 capsule (20 mg total) by mouth 2 (two) times daily before a meal. 60 capsule 5  . ondansetron (ZOFRAN-ODT) 8 MG disintegrating tablet Take 1 tablet (8 mg total) by mouth every 6 (six) hours as needed for nausea or vomiting. 20 tablet 2  . OXcarbazepine (TRILEPTAL) 150 MG tablet Take 150 mg by mouth 3 (three) times daily.     . OxyCODONE (OXYCONTIN) 20 mg T12A 12 hr tablet Take 3 tablets (60 mg total) by mouth 3 (three) times daily. 180 tablet 0  . Oxycodone HCl 10 MG TABS Take 1-2 tablets (10-20 mg total) by mouth every 4 (four) hours as needed (moderate pain, severe pain, breakthrough pain). 120 tablet 0  . polyethylene glycol (MIRALAX / GLYCOLAX) packet Take 17 g by mouth 2 (two) times daily. 30 each 0  . senna-docusate (SENOKOT-S) 8.6-50 MG per tablet Take 2 tablets by mouth 2 (two) times daily. 60 tablet 0  . simvastatin (ZOCOR) 20 MG tablet Take 20 mg by  mouth at bedtime.     . Vitamin D, Ergocalciferol, (  DRISDOL) 50000 UNITS CAPS capsule Take 50,000 Units by mouth every Monday.     . feeding supplement, RESOURCE BREEZE, (RESOURCE BREEZE) LIQD Take 1 Container by mouth 3 (three) times daily between meals. (Patient not taking: Reported on 05/20/2015)    . nicotine (NICODERM CQ - DOSED IN MG/24 HOURS) 21 mg/24hr patch Place 1 patch (21 mg total) onto the skin daily. (Patient not taking: Reported on 05/20/2015) 28 patch 0   No current facility-administered medications for this visit.    REVIEW OF SYSTEMS:   Constitutional: Denies fevers, chills or abnormal night sweats, (+) malaise and weight loss  Eyes: Denies blurriness of vision, double vision or watery eyes Ears, nose, mouth, throat, and face: Denies mucositis or sore throat Respiratory:(+) cpugh, no dyspnea or wheezes Cardiovascular: Denies palpitation, chest discomfort or lower extremity swelling Gastrointestinal:  Denies nausea, heartburn or change in bowel habits Skin: Denies abnormal skin rashes Lymphatics: Denies new lymphadenopathy or easy bruising Neurological:Denies numbness, tingling or new weaknesses Behavioral/Psych: Mood is stable, no new changes  All other systems were reviewed with the patient and are negative.  PHYSICAL EXAMINATION: ECOG PERFORMANCE STATUS: 3  Filed Vitals:   05/20/15 1553  BP: 98/56  Pulse: 113  Temp: 99.4 F (37.4 C)  Resp: 18   Filed Weights   05/20/15 1553  Weight: 119 lb 1.6 oz (54.023 kg)    GENERAL:alert, not in acute distress. SKIN: skin color, texture, turgor are normal, no rashes or significant lesions. EYES: normal, conjunctiva are pink and non-injected, sclera clear OROPHARYNX:no exudate, no erythema and lips, buccal mucosa, and tongue normal  NECK: supple, thyroid normal size, non-tender, without nodularity LYMPH:  no palpable lymphadenopathy in the cervical, axillary or inguinal LUNGS: clear to auscultation and percussion with  normal breathing effort HEART: regular rate & rhythm and no murmurs and no lower extremity edema ABDOMEN:abdomen soft, non-tender and normal bowel sounds Musculoskeletal:no cyanosis of digits and no clubbing no significant tenderness on spine  PSYCH: alert & oriented x 3 with fluent speech NEURO: no focal motor/sensory deficits LEG: no edema   LABORATORY DATA:  I have reviewed the data as listed CBC Latest Ref Rng 05/20/2015 05/07/2015 05/06/2015  WBC 3.9 - 10.3 10e3/uL 4.9 7.8 7.9  Hemoglobin 11.6 - 15.9 g/dL 7.1(L) 8.5(L) 8.7(L)  Hematocrit 34.8 - 46.6 % 22.7(L) 26.3(L) 26.9(L)  Platelets 145 - 400 10e3/uL 26(L) 35(L) 38(L)    CMP Latest Ref Rng 05/20/2015 05/07/2015 05/06/2015  Glucose 70 - 140 mg/dl 105 123(H) 95  BUN 7.0 - 26.0 mg/dL 25.6 16 10   Creatinine 0.6 - 1.1 mg/dL 1.0 0.59 0.44  Sodium 136 - 145 mEq/L 138 140 142  Potassium 3.5 - 5.1 mEq/L 4.1 3.0(L) 3.2(L)  Chloride 101 - 111 mmol/L - 103 105  CO2 22 - 29 mEq/L 28 28 28   Calcium 8.4 - 10.4 mg/dL 9.1 8.8(L) 8.8(L)  Total Protein 6.4 - 8.3 g/dL 5.8(L) - 5.5(L)  Total Bilirubin 0.20 - 1.20 mg/dL 0.36 - 0.4  Alkaline Phos 40 - 150 U/L 531(H) - 512(H)  AST 5 - 34 U/L 16 - 25  ALT 0 - 55 U/L 9 - 12(L)     Pathology report Bone, biopsy, lytic lesion involving the left iliac crest 03/04/2015 - METASTATIC ADENOCARCINOMA WITH ABUNDANT MUCIN. Microscopic Comment The morphologic features are compatible with metastasis from the previous right upper lobe well-differentiated adenocarcinoma with abundant mucin from 12/12/2012 [id number]). ADDENDUM: At the request of Dr. Burr Medico, immunohistochemistry is performed and the tumor cells are  positive with cytokeratin 7, cytokeratin 20, and CDX2. Napsin A, thyroid transcription factor-1, estrogen receptor, progesterone receptor, and gross cystic disease fluid protein are negative. The immunophenotype is similar to the immunophenotype reported for the previous adenocarcinoma with abundant mucin  from the lung.   RADIOGRAPHIC STUDIES: I have personally reviewed the radiological images as listed and agreed with the findings in the report. No new scans   ASSESSMENT & PLAN:  64 year old female, with significant history of smoking and stage I right lung adenocarcinoma status post lobectomy in 2014, no presented with was any back pain and diffuse bone lesions on the lumbar spine MRI.  1. 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 -I reviewed her bone biopsy results, which showed metastatic adenocarcinoma, pulse morphology and immunostain are consistent with her prior lung adenocarcinoma.  -We discussed that this is incurable disease, and overall prognosis is very poor. -Her lung primary was tested negative for EGFR gene mutation and ALK translocation. I have requested [facility] one test on her prior lung cancer tissue. Her bone biopsy tissue was negative for PDL 1 expression. -Due to her noncompliance, and multiple ED and hospital admission, her chemotherapy has been postponed several times. -She now developed worsening thrombocytopenia, likely secondary to her diffuse bone metastasis in the radiation. Her platelet count is 27K today, I did not think she is a candidate for any cytotoxic chemotherapy. She has been very reluctant to take chemotherapy anyway and missed her multiple chemotherapy appointments. -I discussed the goal of therapy is palliative. I recommend palliative care alone, particularly hospice. She is more open and receptive now, she will think about it and let me know.  2.  Left low back and pelvic pain secondary to prior lumbar surgery and bone metastasis -She has diffuse bone metastasis in spine, with the worst at T12 vertebral body,  No cold compression on scan. However she is not symptomatically from T12 lesion -She is on palliative radiation to the left pelvis for metastatic bone related  pain -She did not tolerate morphine, with skin itchiness. -Continue OxyContin to 40 mg every 12 hours,  I given her a new prescription today with 120 pills  -Continue oxycodone as needed, I refilled for her today.  3. Anemia and thrombocytopenia -Likely secondary to her diffuse bone metastasis -I discussed blood transfusion. She is not much symptomatic, she knows to call us if she is more fatigued all has dyspnea and feels she needs blood transfusion. -She knows to avoid 4 and injury, risk of bleeding from thrombocytopenia was discussed  4. Untreated hepatitis C,  Hypertension, diabetes, bipolar - She'll continue follow-up with her primary care physician   5. Intermittent nausea and vomiting, unknown etiology -She was admitted to hospital twice for nausea and vomiting, workup was negative. -She has no nausea vomiting now.  6. Frequent hospital admission and ED visit  -I encouraged her to contact my office for any acute new symptoms, we'll be happy to see her in our symptom management clinic.    Plan -I refilled her oxycodone and OxyContin today -Return to clinic in 1 month - She'll call me if she decides to pursue hospice  All questions were answered. The patient knows to call the clinic with any problems, questions or concerns. I spent 30 minutes counseling the patient face to face. The total time spent in the appointment was 40 minutes and more than 50% was on counseling.  Truitt Merle, MD 05/20/2015 5:11 PM

## 2015-05-20 NOTE — Telephone Encounter (Signed)
Received vm call from daughter, [name] stating that mother needs to schedule a f/u appt with Dr Burr Medico & that she has been trying to reach schedulers x 3 days.  She states her mother missed her appt last wed b/c she was scared & confused & didn't want to do chemo that day.   Daughter called back  @ 11:11 am & states that pt is running out of pain meds & has had some health changes & needs to talk to someone.  Returned call & [name] states that mother has # 3) 10 mg  oxycodones & is out of extended release for 3 days & has been taking the short acting to get by.  She decreased her long acting to 2 tabs q 8 h in hopes that she wouldn't run out & stated that she felt like her heart was going to come out of her chest when she did this.  Informed that scheduler had called & left message for daughter to call back regarding r/s appt for 7/18.  Daughter states pt needs to be seen sooner.  Discussed with Dr Burr Medico & asked if pt could be here for labs @ 3:30 pm & see Dr Burr Medico @ 4pm.  She states that she can get her here.  POF to scheduler.

## 2015-05-21 ENCOUNTER — Telehealth: Payer: Self-pay | Admitting: Hematology

## 2015-05-21 NOTE — Telephone Encounter (Signed)
per pof to sch pt appt-cld & left pt a message and left appt time & date for next appt on 7/29 '@1'$ 

## 2015-06-02 ENCOUNTER — Emergency Department (HOSPITAL_COMMUNITY): Payer: Medicare HMO

## 2015-06-02 ENCOUNTER — Inpatient Hospital Stay (HOSPITAL_COMMUNITY)
Admission: EM | Admit: 2015-06-02 | Discharge: 2015-06-08 | DRG: 181 | Disposition: A | Discharge status: Hospice | Payer: Medicare HMO | Attending: Family Medicine | Admitting: Family Medicine

## 2015-06-02 ENCOUNTER — Encounter (HOSPITAL_COMMUNITY): Payer: Self-pay | Admitting: Emergency Medicine

## 2015-06-02 DIAGNOSIS — Z79891 Long term (current) use of opiate analgesic: Secondary | ICD-10-CM

## 2015-06-02 DIAGNOSIS — Z8249 Family history of ischemic heart disease and other diseases of the circulatory system: Secondary | ICD-10-CM

## 2015-06-02 DIAGNOSIS — R52 Pain, unspecified: Secondary | ICD-10-CM | POA: Diagnosis present

## 2015-06-02 DIAGNOSIS — E119 Type 2 diabetes mellitus without complications: Secondary | ICD-10-CM

## 2015-06-02 DIAGNOSIS — C799 Secondary malignant neoplasm of unspecified site: Secondary | ICD-10-CM

## 2015-06-02 DIAGNOSIS — B1920 Unspecified viral hepatitis C without hepatic coma: Secondary | ICD-10-CM | POA: Diagnosis present

## 2015-06-02 DIAGNOSIS — F419 Anxiety disorder, unspecified: Secondary | ICD-10-CM | POA: Diagnosis present

## 2015-06-02 DIAGNOSIS — E1140 Type 2 diabetes mellitus with diabetic neuropathy, unspecified: Secondary | ICD-10-CM | POA: Diagnosis present

## 2015-06-02 DIAGNOSIS — K3184 Gastroparesis: Secondary | ICD-10-CM | POA: Diagnosis present

## 2015-06-02 DIAGNOSIS — Z515 Encounter for palliative care: Secondary | ICD-10-CM

## 2015-06-02 DIAGNOSIS — E1143 Type 2 diabetes mellitus with diabetic autonomic (poly)neuropathy: Secondary | ICD-10-CM | POA: Diagnosis present

## 2015-06-02 DIAGNOSIS — K5903 Drug induced constipation: Secondary | ICD-10-CM | POA: Diagnosis present

## 2015-06-02 DIAGNOSIS — D649 Anemia, unspecified: Secondary | ICD-10-CM | POA: Diagnosis present

## 2015-06-02 DIAGNOSIS — C3411 Malignant neoplasm of upper lobe, right bronchus or lung: Principal | ICD-10-CM | POA: Diagnosis present

## 2015-06-02 DIAGNOSIS — K219 Gastro-esophageal reflux disease without esophagitis: Secondary | ICD-10-CM | POA: Diagnosis present

## 2015-06-02 DIAGNOSIS — G8929 Other chronic pain: Secondary | ICD-10-CM | POA: Diagnosis present

## 2015-06-02 DIAGNOSIS — E46 Unspecified protein-calorie malnutrition: Secondary | ICD-10-CM | POA: Diagnosis present

## 2015-06-02 DIAGNOSIS — T402X5A Adverse effect of other opioids, initial encounter: Secondary | ICD-10-CM | POA: Diagnosis present

## 2015-06-02 DIAGNOSIS — R0602 Shortness of breath: Secondary | ICD-10-CM | POA: Diagnosis not present

## 2015-06-02 DIAGNOSIS — Z681 Body mass index (BMI) 19 or less, adult: Secondary | ICD-10-CM

## 2015-06-02 DIAGNOSIS — K5909 Other constipation: Secondary | ICD-10-CM | POA: Diagnosis present

## 2015-06-02 DIAGNOSIS — I10 Essential (primary) hypertension: Secondary | ICD-10-CM | POA: Diagnosis present

## 2015-06-02 DIAGNOSIS — Z87891 Personal history of nicotine dependence: Secondary | ICD-10-CM

## 2015-06-02 DIAGNOSIS — Z833 Family history of diabetes mellitus: Secondary | ICD-10-CM

## 2015-06-02 DIAGNOSIS — C7951 Secondary malignant neoplasm of bone: Secondary | ICD-10-CM | POA: Diagnosis present

## 2015-06-02 DIAGNOSIS — Z9119 Patient's noncompliance with other medical treatment and regimen: Secondary | ICD-10-CM | POA: Diagnosis present

## 2015-06-02 DIAGNOSIS — E875 Hyperkalemia: Secondary | ICD-10-CM | POA: Diagnosis present

## 2015-06-02 DIAGNOSIS — M1990 Unspecified osteoarthritis, unspecified site: Secondary | ICD-10-CM | POA: Diagnosis present

## 2015-06-02 DIAGNOSIS — D62 Acute posthemorrhagic anemia: Secondary | ICD-10-CM | POA: Diagnosis present

## 2015-06-02 DIAGNOSIS — D6959 Other secondary thrombocytopenia: Secondary | ICD-10-CM | POA: Diagnosis present

## 2015-06-02 DIAGNOSIS — Z7952 Long term (current) use of systemic steroids: Secondary | ICD-10-CM

## 2015-06-02 DIAGNOSIS — D630 Anemia in neoplastic disease: Secondary | ICD-10-CM | POA: Diagnosis present

## 2015-06-02 DIAGNOSIS — J449 Chronic obstructive pulmonary disease, unspecified: Secondary | ICD-10-CM | POA: Diagnosis present

## 2015-06-02 DIAGNOSIS — F319 Bipolar disorder, unspecified: Secondary | ICD-10-CM | POA: Diagnosis present

## 2015-06-02 DIAGNOSIS — Z66 Do not resuscitate: Secondary | ICD-10-CM | POA: Diagnosis present

## 2015-06-02 DIAGNOSIS — N179 Acute kidney failure, unspecified: Secondary | ICD-10-CM | POA: Diagnosis present

## 2015-06-02 LAB — CBC WITH DIFFERENTIAL/PLATELET
BASOS PCT: 0 % (ref 0–1)
Band Neutrophils: 5 % (ref 0–10)
Basophils Absolute: 0 10*3/uL (ref 0.0–0.1)
Eosinophils Absolute: 0 10*3/uL (ref 0.0–0.7)
Eosinophils Relative: 0 % (ref 0–5)
HCT: 17.6 % — ABNORMAL LOW (ref 36.0–46.0)
Hemoglobin: 5.5 g/dL — CL (ref 12.0–15.0)
Lymphocytes Relative: 17 % (ref 12–46)
Lymphs Abs: 1.6 10*3/uL (ref 0.7–4.0)
MCH: 29.3 pg (ref 26.0–34.0)
MCHC: 31.3 g/dL (ref 30.0–36.0)
MCV: 93.6 fL (ref 78.0–100.0)
MONO ABS: 0.9 10*3/uL (ref 0.1–1.0)
Metamyelocytes Relative: 1 %
Monocytes Relative: 9 % (ref 3–12)
Myelocytes: 3 %
NEUTROS ABS: 7 10*3/uL (ref 1.7–7.7)
Neutrophils Relative %: 65 % (ref 43–77)
Platelets: 14 10*3/uL — CL (ref 150–400)
RBC: 1.88 MIL/uL — ABNORMAL LOW (ref 3.87–5.11)
RDW: 19.6 % — ABNORMAL HIGH (ref 11.5–15.5)
WBC: 9.5 10*3/uL (ref 4.0–10.5)
nRBC: 17 /100 WBC — ABNORMAL HIGH

## 2015-06-02 LAB — URINALYSIS, ROUTINE W REFLEX MICROSCOPIC
Bilirubin Urine: NEGATIVE
GLUCOSE, UA: NEGATIVE mg/dL
Hgb urine dipstick: NEGATIVE
Ketones, ur: NEGATIVE mg/dL
LEUKOCYTES UA: NEGATIVE
Nitrite: NEGATIVE
Protein, ur: NEGATIVE mg/dL
SPECIFIC GRAVITY, URINE: 1.016 (ref 1.005–1.030)
Urobilinogen, UA: 1 mg/dL (ref 0.0–1.0)
pH: 6 (ref 5.0–8.0)

## 2015-06-02 LAB — GLUCOSE, CAPILLARY: Glucose-Capillary: 151 mg/dL — ABNORMAL HIGH (ref 65–99)

## 2015-06-02 LAB — HEMOGLOBIN AND HEMATOCRIT, BLOOD
HCT: 18.6 % — ABNORMAL LOW (ref 36.0–46.0)
Hemoglobin: 5.7 g/dL — CL (ref 12.0–15.0)

## 2015-06-02 LAB — RETICULOCYTES
RBC.: 1.95 MIL/uL — AB (ref 3.87–5.11)
Retic Count, Absolute: 64.4 10*3/uL (ref 19.0–186.0)
Retic Ct Pct: 3.3 % — ABNORMAL HIGH (ref 0.4–3.1)

## 2015-06-02 LAB — IRON AND TIBC
Iron: 222 ug/dL — ABNORMAL HIGH (ref 28–170)
SATURATION RATIOS: 64 % — AB (ref 10.4–31.8)
TIBC: 346 ug/dL (ref 250–450)
UIBC: 124 ug/dL

## 2015-06-02 LAB — I-STAT CG4 LACTIC ACID, ED
LACTIC ACID, VENOUS: 3.51 mmol/L — AB (ref 0.5–2.0)
LACTIC ACID, VENOUS: 2.32 mmol/L — AB (ref 0.5–2.0)

## 2015-06-02 LAB — COMPREHENSIVE METABOLIC PANEL
ALT: 15 U/L (ref 14–54)
AST: 27 U/L (ref 15–41)
Albumin: 3.4 g/dL — ABNORMAL LOW (ref 3.5–5.0)
Alkaline Phosphatase: 427 U/L — ABNORMAL HIGH (ref 38–126)
Anion gap: 8 (ref 5–15)
BILIRUBIN TOTAL: 0.4 mg/dL (ref 0.3–1.2)
BUN: 43 mg/dL — AB (ref 6–20)
CHLORIDE: 104 mmol/L (ref 101–111)
CO2: 24 mmol/L (ref 22–32)
Calcium: 8.4 mg/dL — ABNORMAL LOW (ref 8.9–10.3)
Creatinine, Ser: 1.05 mg/dL — ABNORMAL HIGH (ref 0.44–1.00)
GFR calc Af Amer: >60 mL/min (ref >60)
GFR calc non Af Amer: 54 mL/min — ABNORMAL LOW (ref >60)
Glucose, Bld: 120 mg/dL — ABNORMAL HIGH (ref 65–99)
Potassium: 5.4 mmol/L — ABNORMAL HIGH (ref 3.5–5.1)
Sodium: 136 mmol/L (ref 135–145)
Total Protein: 6.2 g/dL — ABNORMAL LOW (ref 6.5–8.1)

## 2015-06-02 LAB — VITAMIN B12: Vitamin B-12: 1141 pg/mL — ABNORMAL HIGH (ref 180–914)

## 2015-06-02 LAB — PREPARE RBC (CROSSMATCH)

## 2015-06-02 LAB — FOLATE: Folate: 12.4 ng/mL (ref >5.9)

## 2015-06-02 LAB — FERRITIN: Ferritin: 761 ng/mL — ABNORMAL HIGH (ref 11–307)

## 2015-06-02 MED ORDER — ONDANSETRON 4 MG PO TBDP
8.0000 mg | ORAL_TABLET | Freq: Four times a day (QID) | ORAL | Status: DC | PRN
  Filled 2015-06-02: qty 2

## 2015-06-02 MED ORDER — PANTOPRAZOLE SODIUM 40 MG PO TBEC
40.0000 mg | DELAYED_RELEASE_TABLET | Freq: Every day | ORAL | Status: DC
  Administered 2015-06-03 – 2015-06-08 (×6): 40 mg via ORAL
  Filled 2015-06-02 (×7): qty 1

## 2015-06-02 MED ORDER — POTASSIUM CHLORIDE CRYS ER 20 MEQ PO TBCR: 20.0000 meq | EXTENDED_RELEASE_TABLET | Freq: Every morning | ORAL | Status: DC

## 2015-06-02 MED ORDER — FENTANYL CITRATE (PF) 100 MCG/2ML IJ SOLN
50.0000 ug | Freq: Once | INTRAMUSCULAR | Status: AC
  Administered 2015-06-03: 50 ug via INTRAVENOUS
  Filled 2015-06-02: qty 2

## 2015-06-02 MED ORDER — ALPRAZOLAM 0.5 MG PO TABS
0.5000 mg | ORAL_TABLET | Freq: Every day | ORAL | Status: DC
  Administered 2015-06-03 – 2015-06-08 (×6): 0.5 mg via ORAL
  Filled 2015-06-02 (×6): qty 1

## 2015-06-02 MED ORDER — OXYCODONE HCL ER 20 MG PO T12A
20.0000 mg | EXTENDED_RELEASE_TABLET | Freq: Three times a day (TID) | ORAL | Status: DC
  Administered 2015-06-02 – 2015-06-03 (×4): 20 mg via ORAL
  Filled 2015-06-02 (×4): qty 1

## 2015-06-02 MED ORDER — POLYETHYLENE GLYCOL 3350 17 G PO PACK
17.0000 g | PACK | Freq: Two times a day (BID) | ORAL | Status: DC
  Administered 2015-06-02 – 2015-06-08 (×12): 17 g via ORAL
  Filled 2015-06-02 (×14): qty 1

## 2015-06-02 MED ORDER — VITAMIN D (ERGOCALCIFEROL) 1.25 MG (50000 UNIT) PO CAPS
50000.0000 [IU] | ORAL_CAPSULE | ORAL | Status: DC
  Administered 2015-06-07: 50000 [IU] via ORAL
  Filled 2015-06-02: qty 1

## 2015-06-02 MED ORDER — FENTANYL CITRATE (PF) 100 MCG/2ML IJ SOLN
100.0000 ug | Freq: Once | INTRAMUSCULAR | Status: AC
  Administered 2015-06-02: 100 ug via INTRAVENOUS
  Filled 2015-06-02: qty 2

## 2015-06-02 MED ORDER — PANTOPRAZOLE SODIUM 40 MG PO TBEC: 40.0000 mg | DELAYED_RELEASE_TABLET | Freq: Every day | ORAL | Status: DC

## 2015-06-02 MED ORDER — SENNOSIDES-DOCUSATE SODIUM 8.6-50 MG PO TABS
2.0000 | ORAL_TABLET | Freq: Two times a day (BID) | ORAL | Status: DC
  Administered 2015-06-02 – 2015-06-08 (×12): 2 via ORAL
  Filled 2015-06-02 (×13): qty 2

## 2015-06-02 MED ORDER — BOOST / RESOURCE BREEZE PO LIQD
1.0000 | Freq: Three times a day (TID) | ORAL | Status: DC
  Administered 2015-06-02 – 2015-06-08 (×14): 1 via ORAL

## 2015-06-02 MED ORDER — ALPRAZOLAM 0.5 MG PO TABS: 0.5000 mg | ORAL_TABLET | Freq: Every day | ORAL | Status: DC

## 2015-06-02 MED ORDER — IPRATROPIUM-ALBUTEROL 0.5-2.5 (3) MG/3ML IN SOLN: 3.0000 mL | Freq: Every day | RESPIRATORY_TRACT | Status: DC | PRN

## 2015-06-02 MED ORDER — METOCLOPRAMIDE HCL 5 MG PO TABS
5.0000 mg | ORAL_TABLET | Freq: Three times a day (TID) | ORAL | Status: DC
  Administered 2015-06-03 – 2015-06-08 (×16): 5 mg via ORAL
  Filled 2015-06-02 (×20): qty 1

## 2015-06-02 MED ORDER — OXYCODONE HCL ER 10 MG PO T12A: 20.0000 mg | EXTENDED_RELEASE_TABLET | Freq: Three times a day (TID) | ORAL | Status: DC

## 2015-06-02 MED ORDER — SODIUM CHLORIDE 0.9 % IV SOLN
Freq: Once | INTRAVENOUS | Status: AC
  Administered 2015-06-02: 20:00:00 via INTRAVENOUS

## 2015-06-02 MED ORDER — DICYCLOMINE HCL 20 MG PO TABS
20.0000 mg | ORAL_TABLET | Freq: Three times a day (TID) | ORAL | Status: DC
  Administered 2015-06-03 – 2015-06-08 (×16): 20 mg via ORAL
  Filled 2015-06-02 (×20): qty 1

## 2015-06-02 MED ORDER — IRBESARTAN 150 MG PO TABS
150.0000 mg | ORAL_TABLET | Freq: Every day | ORAL | Status: DC
  Administered 2015-06-03 – 2015-06-08 (×6): 150 mg via ORAL
  Filled 2015-06-02 (×6): qty 1

## 2015-06-02 MED ORDER — METOPROLOL TARTRATE 25 MG PO TABS
25.0000 mg | ORAL_TABLET | Freq: Every day | ORAL | Status: DC
  Administered 2015-06-03 – 2015-06-08 (×6): 25 mg via ORAL
  Filled 2015-06-02 (×6): qty 1

## 2015-06-02 MED ORDER — INSULIN ASPART 100 UNIT/ML ~~LOC~~ SOLN
0.0000 [IU] | Freq: Three times a day (TID) | SUBCUTANEOUS | Status: DC
  Administered 2015-06-04 – 2015-06-05 (×3): 1 [IU] via SUBCUTANEOUS
  Administered 2015-06-05 (×2): 2 [IU] via SUBCUTANEOUS
  Administered 2015-06-06: 1 [IU] via SUBCUTANEOUS
  Administered 2015-06-06: 2 [IU] via SUBCUTANEOUS
  Administered 2015-06-07 (×2): 1 [IU] via SUBCUTANEOUS

## 2015-06-02 MED ORDER — IPRATROPIUM-ALBUTEROL 20-100 MCG/ACT IN AERS: 2.0000 | INHALATION_SPRAY | Freq: Every day | RESPIRATORY_TRACT | Status: DC | PRN

## 2015-06-02 MED ORDER — OXCARBAZEPINE 150 MG PO TABS
150.0000 mg | ORAL_TABLET | Freq: Three times a day (TID) | ORAL | Status: DC
  Administered 2015-06-02 – 2015-06-08 (×17): 150 mg via ORAL
  Filled 2015-06-02 (×19): qty 1

## 2015-06-02 MED ORDER — GABAPENTIN 400 MG PO CAPS
400.0000 mg | ORAL_CAPSULE | Freq: Three times a day (TID) | ORAL | Status: DC
  Administered 2015-06-02 – 2015-06-08 (×17): 400 mg via ORAL
  Filled 2015-06-02 (×19): qty 1

## 2015-06-02 MED ORDER — LORATADINE 10 MG PO TABS
10.0000 mg | ORAL_TABLET | Freq: Every morning | ORAL | Status: DC
  Administered 2015-06-03 – 2015-06-08 (×6): 10 mg via ORAL
  Filled 2015-06-02 (×6): qty 1

## 2015-06-02 MED ORDER — OXYCODONE HCL 5 MG PO TABS
10.0000 mg | ORAL_TABLET | ORAL | Status: DC | PRN
  Administered 2015-06-02 – 2015-06-03 (×6): 10 mg via ORAL
  Filled 2015-06-02 (×6): qty 2

## 2015-06-02 MED ORDER — DULOXETINE HCL 30 MG PO CPEP
30.0000 mg | ORAL_CAPSULE | Freq: Every day | ORAL | Status: DC
  Administered 2015-06-03 – 2015-06-08 (×6): 30 mg via ORAL
  Filled 2015-06-02 (×6): qty 1

## 2015-06-02 MED ORDER — INSULIN ASPART 100 UNIT/ML ~~LOC~~ SOLN: 0.0000 [IU] | Freq: Every day | SUBCUTANEOUS | Status: DC

## 2015-06-02 MED ORDER — METFORMIN HCL 500 MG PO TABS
500.0000 mg | ORAL_TABLET | Freq: Two times a day (BID) | ORAL | Status: DC
  Administered 2015-06-03 (×2): 500 mg via ORAL
  Filled 2015-06-02 (×3): qty 1

## 2015-06-02 MED ORDER — MAGNESIUM HYDROXIDE 400 MG/5ML PO SUSP: 5.0000 mL | Freq: Every day | ORAL | Status: DC | PRN

## 2015-06-02 MED ORDER — ENSURE ENLIVE PO LIQD
237.0000 mL | Freq: Two times a day (BID) | ORAL | Status: DC
  Administered 2015-06-03 (×2): 237 mL via ORAL

## 2015-06-02 MED ORDER — FENTANYL CITRATE (PF) 100 MCG/2ML IJ SOLN
50.0000 ug | Freq: Once | INTRAMUSCULAR | Status: AC
  Administered 2015-06-02: 50 ug via INTRAVENOUS
  Filled 2015-06-02: qty 2

## 2015-06-02 NOTE — ED Notes (Signed)
Informed Dr. Venora Maples of lactic acid of 2.32 @ 1813 by QA

## 2015-06-02 NOTE — ED Notes (Addendum)
Pt has lung and metastatic bone cancer, daughter states for the last 3 weeks pt has been increasingly irritable and more confused. Pt is rambling about different ideas in triage, hard to understand pt. Daughter doesn't know if it is from the cancer or medications. Daughter states pt is normally coherent.

## 2015-06-02 NOTE — ED Provider Notes (Signed)
CSN: [id number]     Arrival date & time 06/02/15  1356 History   First MD Initiated Contact with Patient 06/02/15 1502     Chief Complaint  Patient presents with  . Altered Mental Status  . cancer pt      HPI Patient has advanced metastatic lung cancer.  She is currently not receiving chemotherapy and the oncology team does not believe she is good candidate for chemotherapy recommends palliative care at this time.  Patient has currently not sought palliative care assistance at this point.  She was brought to the emergency department today by her daughter for noted confusion over the past several days.  She reports increasing shortness of breath with exertion over the past several days as well.  She denies abdominal pain.  She denies nausea vomiting.  No diarrhea.  No blood in her stool.  No melena or hematochezia described.  Symptoms are mild to moderate in severity.  Her daughter is currently not with her at this time to provide additional history.   Past Medical History  Diagnosis Date  . Hypertension   . Bipolar 1 disorder   . Heart murmur   . Sciatica   . Arthritis   . Anxiety   . Diverticulosis   . Chronic low back pain   . DDD (degenerative disc disease), lumbar   . Chronic shoulder pain   . Chronic neck pain   . Hepatitis B     Unclear when initially diagnosed, labs in Epic from 03/09/13  . Hepatitis C     Unclear when initially diagnosed, labs in Epic from 03/09/13  . C. difficile diarrhea     April and February 2014  . COPD (chronic obstructive pulmonary disease)   . GERD (gastroesophageal reflux disease)   . H/O hiatal hernia   . Headache(784.0)     hx  . PONV (postoperative nausea and vomiting)   . Seizures     on meds  . Diabetes mellitus   . Adenocarcinoma of lung, stage 1   . GERD (gastroesophageal reflux disease)    Past Surgical History  Procedure Laterality Date  . Facial tumor removal Right 2000  . Brain tumor removal  09  . Brain surgery      in  [place]  . Video bronchoscopy  12/04/2012    Procedure: VIDEO BRONCHOSCOPY;  Surgeon: Grace Isaac, MD;  Location: [facility];  Service: Thoracic;  Laterality: N/A;  . Video assisted thoracoscopy (vats)/wedge resection  12/04/2012    Procedure: VIDEO ASSISTED THORACOSCOPY (VATS)/WEDGE RESECTION;  Surgeon: Grace Isaac, MD;  Location: [facility];  Service: Thoracic;  Laterality: Right;  . Lobectomy  12/04/2012    Procedure: LOBECTOMY;  Surgeon: Grace Isaac, MD;  Location: [place];  Service: Thoracic;  Laterality: Right;  completion of right upper lobectomy and lymph node disection, placement of on q pump  . Tubal ligation    . Tonsillectomy    . Anterior cervical decomp/discectomy fusion N/A 03/05/2014    Procedure: ANTERIOR CERVICAL DECOMPRESSION/DISCECTOMY FUSION 2 LEVELS;  Surgeon: Sinclair Ship, MD;  Location: [place];  Service: Orthopedics;  Laterality: N/A;  Anterior cervical decompression fusion, cervical 4-5, cervical 5-6 with instrumentation, allograft.   Family History  Problem Relation Age of Onset  . Hypertension Father     deceased  . Diabetes Father   . Heart disease Father   . Hyperlipidemia Father   . Hypertension Mother   . Hyperlipidemia Mother   . Diabetes  Mother   . Hypertension Sister   . Hyperlipidemia Sister   . Hypertension Brother   . Hyperlipidemia Brother    History  Substance Use Topics  . Smoking status: Former Smoker -- 0.25 packs/day for 10 years    Types: Cigarettes    Quit date: 07/07/2013  . Smokeless tobacco: Never Used     Comment: Smokes pk q 3 days   marajuna aug  . Alcohol Use: No     Comment: quit 57yr ago   OB History    No data available     Review of Systems  All other systems reviewed and are negative.     Allergies  Haldol; Acetaminophen; Ibuprofen; Metronidazole; Morphine and related; and Penicillins  Home Medications   Prior to Admission medications   Medication Sig Start Date End Date Taking? Authorizing  Provider  ALPRAZolam (Duanne Moron 1 MG tablet Take 0.5 tablets (0.5 mg total) by mouth daily. 05/20/15  Yes YTruitt Merle MD  Cyanocobalamin (VITAMIN B-12 IJ) Inject as directed every 30 (thirty) days. Administered by Dr. DYork Ramaround the 1st of each month   Yes Historical Provider, MD  dexamethasone (DECADRON) 4 MG tablet Take 1 tablet (4 mg total) by mouth daily. 03/12/15  Yes GOswald Hillock MD  dicyclomine (BENTYL) 20 MG tablet Take 20 mg by mouth 3 (three) times daily with meals.   Yes Historical Provider, MD  DULoxetine (CYMBALTA) 30 MG capsule Take 1 capsule (30 mg total) by mouth daily. 05/06/15  Yes AModena Jansky MD  feeding supplement, ENSURE ENLIVE, (ENSURE ENLIVE) LIQD Take 237 mLs by mouth 2 (two) times daily between meals. 05/06/15  Yes AModena Jansky MD  gabapentin (NEURONTIN) 400 MG capsule Take 400 mg by mouth 3 (three) times daily. Nerve pain   Yes Historical Provider, MD  Ipratropium-Albuterol (COMBIVENT RESPIMAT) 20-100 MCG/ACT AERS respimat Inhale 2 puffs into the lungs daily as needed for wheezing or shortness of breath.    Yes Historical Provider, MD  KLOR-CON M20 20 MEQ tablet Take 20 mEq by mouth every morning. with food 01/04/15  Yes Historical Provider, MD  loratadine (CLARITIN) 10 MG tablet Take 10 mg by mouth every morning.    Yes Historical Provider, MD  magnesium hydroxide (MILK OF MAGNESIA) 400 MG/5ML suspension Take 5 mLs by mouth daily as needed for mild constipation. 03/24/15  Yes Costin MKarlyne Greenspan MD  Menthol-Methyl Salicylate (MUSCLE RUB) 10-15 % CREA Apply 1 application topically daily as needed for muscle pain. Left shoulder pain   Yes Historical Provider, MD  metFORMIN (GLUCOPHAGE) 500 MG tablet Take 1 tablet by mouth 2 (two) times daily. 03/30/15  Yes Historical Provider, MD  metoCLOPramide (REGLAN) 5 MG tablet Take 1 tablet (5 mg total) by mouth 3 (three) times daily before meals. 03/12/15  Yes GOswald Hillock MD  metoprolol tartrate (LOPRESSOR) 25 MG tablet Take  25 mg by mouth daily. 05/24/15  Yes Historical Provider, MD  olmesartan (BENICAR) 40 MG tablet Take 1 tablet (40 mg total) by mouth daily. 05/06/15  Yes AModena Jansky MD  omeprazole (PRILOSEC) 20 MG capsule Take 1 capsule (20 mg total) by mouth 2 (two) times daily before a meal. 03/26/15  Yes MTyler Pita MD  ondansetron (ZOFRAN-ODT) 8 MG disintegrating tablet Take 1 tablet (8 mg total) by mouth every 6 (six) hours as needed for nausea or vomiting. 04/28/15  Yes YTruitt Merle MD  OXcarbazepine (TRILEPTAL) 150 MG tablet Take 150 mg by mouth 3 (three) times daily.  11/24/13  Yes Historical Provider, MD  OxyCODONE (OXYCONTIN) 20 mg T12A 12 hr tablet Take 3 tablets (60 mg total) by mouth 3 (three) times daily. Patient taking differently: Take 20-40 mg by mouth 3 (three) times daily.  05/20/15  Yes Truitt Merle, MD  Oxycodone HCl 10 MG TABS Take 1-2 tablets (10-20 mg total) by mouth every 4 (four) hours as needed (moderate pain, severe pain, breakthrough pain). 05/20/15  Yes Truitt Merle, MD  polyethylene glycol (MIRALAX / GLYCOLAX) packet Take 17 g by mouth 2 (two) times daily. 05/06/15  Yes Modena Jansky, MD  senna-docusate (SENOKOT-S) 8.6-50 MG per tablet Take 2 tablets by mouth 2 (two) times daily. 05/06/15  Yes Modena Jansky, MD  simvastatin (ZOCOR) 20 MG tablet Take 20 mg by mouth at bedtime.    Yes Historical Provider, MD  Vitamin D, Ergocalciferol, (DRISDOL) 50000 UNITS CAPS capsule Take 50,000 Units by mouth every Monday.    Yes Historical Provider, MD  feeding supplement, RESOURCE BREEZE, (RESOURCE BREEZE) LIQD Take 1 Container by mouth 3 (three) times daily between meals. Patient not taking: Reported on 05/20/2015 05/06/15   Modena Jansky, MD  folic acid (FOLVITE) 1 MG tablet Take 1 tablet (1 mg total) by mouth daily. Patient not taking: Reported on 06/02/2015 03/23/15   Truitt Merle, MD  nicotine (NICODERM CQ - DOSED IN MG/24 HOURS) 21 mg/24hr patch Place 1 patch (21 mg total) onto the skin daily. Patient not  taking: Reported on 05/20/2015 05/06/15   Modena Jansky, MD   BP 137/74 mmHg  Pulse 93  Temp(Src) 100.7 F (38.2 C) (Rectal)  Resp 18  SpO2 95% Physical Exam  Constitutional: She is oriented to person, place, and time. She appears well-developed and well-nourished. No distress.  HENT:  Head: Normocephalic and atraumatic.  Eyes: EOM are normal.  Neck: Normal range of motion.  Cardiovascular: Normal rate, regular rhythm and normal heart sounds.   Pulmonary/Chest: Effort normal and breath sounds normal.  Abdominal: Soft. She exhibits no distension. There is no tenderness.  Musculoskeletal: Normal range of motion.  Neurological: She is alert and oriented to person, place, and time.  Skin: Skin is warm and dry.  Psychiatric: She has a normal mood and affect. Judgment normal.  Nursing note and vitals reviewed.   ED Course  Procedures (including critical care time) Labs Review Labs Reviewed  COMPREHENSIVE METABOLIC PANEL - Abnormal; Notable for the following:    Potassium 5.4 (*)    Glucose, Bld 120 (*)    BUN 43 (*)    Creatinine, Ser 1.05 (*)    Calcium 8.4 (*)    Total Protein 6.2 (*)    Albumin 3.4 (*)    Alkaline Phosphatase 427 (*)    GFR calc non Af Amer 54 (*)    All other components within normal limits  URINALYSIS, ROUTINE W REFLEX MICROSCOPIC (NOT AT [facility]) - Abnormal; Notable for the following:    APPearance CLOUDY (*)    All other components within normal limits  CBC WITH DIFFERENTIAL/PLATELET - Abnormal; Notable for the following:    RBC 1.88 (*)    Hemoglobin 5.5 (*)    HCT 17.6 (*)    RDW 19.6 (*)    Platelets 14 (*)    nRBC 17 (*)    All other components within normal limits  RETICULOCYTES - Abnormal; Notable for the following:    Retic Ct Pct 3.3 (*)    RBC. 1.95 (*)    All other  components within normal limits  HEMOGLOBIN AND HEMATOCRIT, BLOOD - Abnormal; Notable for the following:    Hemoglobin 5.7 (*)    HCT 18.6 (*)    All other components  within normal limits  I-STAT CG4 LACTIC ACID, ED - Abnormal; Notable for the following:    Lactic Acid, Venous 3.51 (*)    All other components within normal limits  CULTURE, BLOOD (ROUTINE X 2)  CULTURE, BLOOD (ROUTINE X 2)  URINE CULTURE  VITAMIN B12  FOLATE  IRON AND TIBC  FERRITIN  I-STAT CG4 LACTIC ACID, ED  TYPE AND SCREEN   HEMOGLOBIN  Date Value Ref Range Status  06/02/2015 5.7* 12.0 - 15.0 g/dL Final    Comment:    CRITICAL VALUE NOTED.  VALUE IS CONSISTENT WITH PREVIOUSLY REPORTED AND CALLED VALUE.  06/02/2015 5.5* 12.0 - 15.0 g/dL Final    Comment:    REPEATED TO VERIFY CRITICAL RESULT CALLED TO, READ BACK BY AND VERIFIED WITH: M HAMBY RN BY A MORRIS MLS @ 1536 7.13.16   05/07/2015 8.5* 12.0 - 15.0 g/dL Final  05/06/2015 8.7* 12.0 - 15.0 g/dL Final   HGB  Date Value Ref Range Status  05/20/2015 7.1* 11.6 - 15.9 g/dL Final  04/28/2015 8.8* 11.6 - 15.9 g/dL Final  03/23/2015 12.0 11.6 - 15.9 g/dL Final  02/11/2015 12.6 11.6 - 15.9 g/dL Final   BUN  Date Value Ref Range Status  06/02/2015 43* 6 - 20 mg/dL Final  05/20/2015 25.6 7.0 - 26.0 mg/dL Final  05/07/2015 16 6 - 20 mg/dL Final  05/06/2015 10 6 - 20 mg/dL Final  05/02/2015 14 6 - 20 mg/dL Final  04/28/2015 14.5 7.0 - 26.0 mg/dL Final  03/23/2015 17.6 7.0 - 26.0 mg/dL Final   CREATININE  Date Value Ref Range Status  05/20/2015 1.0 0.6 - 1.1 mg/dL Final  04/28/2015 0.7 0.6 - 1.1 mg/dL Final  03/23/2015 0.8 0.6 - 1.1 mg/dL Final   CREATININE, SER  Date Value Ref Range Status  06/02/2015 1.05* 0.44 - 1.00 mg/dL Final  05/07/2015 0.59 0.44 - 1.00 mg/dL Final  05/06/2015 0.44 0.44 - 1.00 mg/dL Final  05/02/2015 0.44 0.44 - 1.00 mg/dL Final        Imaging Review Dg Chest 2 View  06/02/2015   CLINICAL DATA:  Fever.  Metastatic lung carcinoma  EXAM: CHEST  2 VIEW  COMPARISON:  Chest radiograph April 29, 2015; PET-CT Apr 01, 2015  FINDINGS: There is postoperative change on the right. The interstitium  is mildly prominent but stable. There is no frank edema or consolidation. The heart is borderline enlarged with pulmonary vascular within normal limits. No adenopathy is appreciable. Port-A-Cath tip is in the superior vena cava. No pneumothorax. There is widespread bony metastatic disease, better appreciated on the PET study. There is postoperative change in the cervical spine. There is mid thoracic dextroscoliosis.  IMPRESSION: No edema or consolidation. No mass appreciable. Diffuse interstitial prominence. Question lymphangitic spread of tumor versus interstitial prominence due to allergic type phenomenon from chemotherapy. Widespread bony metastatic disease, better appreciated on the recent PET study. No adenopathy apparent.   Electronically Signed   By: Lowella Grip III M.D.   On: 06/02/2015 14:47   Ct Head Wo Contrast  06/02/2015   CLINICAL DATA:  Metastatic lung cancer.  Altered mental status.  EXAM: CT HEAD WITHOUT CONTRAST  TECHNIQUE: Contiguous axial images were obtained from the base of the skull through the vertex without intravenous contrast.  COMPARISON:  MR brain  02/26/2015  FINDINGS: There is no evidence of mass effect, midline shift or extra-axial fluid collections. There is no evidence of a space-occupying lesion or intracranial hemorrhage. There is no evidence of a cortical-based area of acute infarction. There is left frontal lobe encephalomalacia from prior insult.  The ventricles and sulci are appropriate for the patient's age. The basal cisterns are patent.  Visualized portions of the orbits are unremarkable. The visualized portions of the paranasal sinuses and mastoid air cells are unremarkable.  There is diffuse calvarial metastatic disease. There is evidence of prior left frontal craniotomy.  IMPRESSION: 1. No acute intracranial pathology. 2. Evaluation for metastatic disease is limited secondary to lack of intravenous contrast. 3. Stable left frontal lobe encephalomalacia. 4.  Osseous metastatic disease throughout the calvarium.   Electronically Signed   By: Kathreen Devoid   On: 06/02/2015 15:47  I personally reviewed the imaging tests through PACS system I reviewed available ER/hospitalization records through the EMR    EKG Interpretation None      MDM   Final diagnoses:  Metastatic cancer  Anemia, unspecified anemia type    Patient this time does not have a full understanding of her advanced cancer.  She was not sure why she was receiving chemotherapy.  She does request a blood transfusion this time for hemoglobin of 5.  I will transfuse HER-2 units of blood at this time.  I will hydrate her at this time.  I've attempted to contact the daughter without success.  Patient need a palliative care consultation in the hospital    Jola Schmidt, MD 06/02/15 1818

## 2015-06-02 NOTE — ED Notes (Signed)
Informed Dr. Venora Maples of lactic acid of 3.51 @ 1525 by QA

## 2015-06-02 NOTE — ED Notes (Signed)
She remains awake and confused and in no distress.  Additional labs just sent.

## 2015-06-02 NOTE — Progress Notes (Signed)
EDCM spoke to patient briefly at bedside.  Patient noted to be admitted and discharged from the hospital from 06/09 to 06/17.  Patient reports she was able to see her oncologist since she has been discharged.  Patient confirms her pcp is Dr. York Ram.  Patient confirms she has received a discharge follow up phone call.  Patient reports she does not have any home health services at this time.  EDCM asked patient if she is interested in receiving home health services?  Patient denied need for home health services.   Patient noted to have 4 admissions and 10 ED visits within the last six months.  Physician at bedside.  No further EDCM needs at this time.

## 2015-06-02 NOTE — H&P (Signed)
[facility] History and Physical  [name] [name] PFX:[id number] DOB: 10/12/1951 DOA: 06/02/2015  Referring physician:  PCP: [name] Junior, MD   Chief Complaint:   HPI: [name] [name] is a 64 y.o. female with bellow past medical history which includes stage IV lung cancer with metastasis to the bone. She is not currently receiving chemotherapy because the oncology team does not believe her to be a good candidate for chemotherapy. She was brought in by her daughter today due to increased fatigue shortness of breath and confusion over the last few days. Patient also complains of constant pain due to her metastatic disease.   She was in acute distress due to pain.  Review of Systems:  The patient was in pain and declined to provide a full review of systems due to the distress of it. Fentanyl was ordered since the patient may have missed one of her schedule or when necessary oxycodone tablets from home.   Past Medical History  Diagnosis Date  . Hypertension   . Bipolar 1 disorder   . Heart murmur   . Sciatica   . Arthritis   . Anxiety   . Diverticulosis   . Chronic low back pain   . DDD (degenerative disc disease), lumbar   . Chronic shoulder pain   . Chronic neck pain   . Hepatitis B     Unclear when initially diagnosed, labs in Epic from 03/09/13  . Hepatitis C     Unclear when initially diagnosed, labs in Epic from 03/09/13  . C. difficile diarrhea     April and February 2014  . COPD (chronic obstructive pulmonary disease)   . GERD (gastroesophageal reflux disease)   . H/O hiatal hernia   . Headache(784.0)     hx  . PONV (postoperative nausea and vomiting)   . Seizures     on meds  . Diabetes mellitus   . Adenocarcinoma of lung, stage 1   . GERD (gastroesophageal reflux disease)    Past Surgical History  Procedure Laterality Date  . Facial tumor removal Right 2000  . Brain tumor removal  09  . Brain surgery      in [place]  . Video bronchoscopy   12/04/2012    Procedure: VIDEO BRONCHOSCOPY;  Surgeon: Grace Isaac, MD;  Location: [facility] OR;  Service: Thoracic;  Laterality: N/A;  . Video assisted thoracoscopy (vats)/wedge resection  12/04/2012    Procedure: VIDEO ASSISTED THORACOSCOPY (VATS)/WEDGE RESECTION;  Surgeon: Grace Isaac, MD;  Location: [place];  Service: Thoracic;  Laterality: Right;  . Lobectomy  12/04/2012    Procedure: LOBECTOMY;  Surgeon: Grace Isaac, MD;  Location: [place];  Service: Thoracic;  Laterality: Right;  completion of right upper lobectomy and lymph node disection, placement of on q pump  . Tubal ligation    . Tonsillectomy    . Anterior cervical decomp/discectomy fusion N/A 03/05/2014    Procedure: ANTERIOR CERVICAL DECOMPRESSION/DISCECTOMY FUSION 2 LEVELS;  Surgeon: Sinclair Ship, MD;  Location: [place];  Service: Orthopedics;  Laterality: N/A;  Anterior cervical decompression fusion, cervical 4-5, cervical 5-6 with instrumentation, allograft.   Social History:  reports that she quit smoking about 22 months ago. Her smoking use included Cigarettes. She has a 2.5 pack-year smoking history. She has never used smokeless tobacco. She reports that she uses illicit drugs (Hydrocodone and Marijuana). She reports that she does not drink alcohol.  Allergies  Allergen Reactions  . Haldol [Haloperidol Decanoate] Other (See Comments)  tongue swelling  . Acetaminophen Other (See Comments)    Stomach pain  . Ibuprofen Other (See Comments)    Stomach pain  . Metronidazole Other (See Comments)    Severe stomach upset--was instructed to avoid Flagyl.  . Morphine And Related Hives    Tolerates hydromorphone.  Marland Kitchen Penicillins Rash    Family History  Problem Relation Age of Onset  . Hypertension Father     deceased  . Diabetes Father   . Heart disease Father   . Hyperlipidemia Father   . Hypertension Mother   . Hyperlipidemia Mother   . Diabetes Mother   . Hypertension Sister   . Hyperlipidemia Sister     . Hypertension Brother   . Hyperlipidemia Brother     Prior to Admission medications   Medication Sig Start Date End Date Taking? Authorizing Provider  ALPRAZolam Duanne Moron) 1 MG tablet Take 0.5 tablets (0.5 mg total) by mouth daily. 05/20/15  Yes Truitt Merle, MD  Cyanocobalamin (VITAMIN B-12 IJ) Inject as directed every 30 (thirty) days. Administered by Dr. York Ram around the 1st of each month   Yes Historical Provider, MD  dexamethasone (DECADRON) 4 MG tablet Take 1 tablet (4 mg total) by mouth daily. 03/12/15  Yes Oswald Hillock, MD  dicyclomine (BENTYL) 20 MG tablet Take 20 mg by mouth 3 (three) times daily with meals.   Yes Historical Provider, MD  DULoxetine (CYMBALTA) 30 MG capsule Take 1 capsule (30 mg total) by mouth daily. 05/06/15  Yes Modena Jansky, MD  feeding supplement, ENSURE ENLIVE, (ENSURE ENLIVE) LIQD Take 237 mLs by mouth 2 (two) times daily between meals. 05/06/15  Yes Modena Jansky, MD  gabapentin (NEURONTIN) 400 MG capsule Take 400 mg by mouth 3 (three) times daily. Nerve pain   Yes Historical Provider, MD  Ipratropium-Albuterol (COMBIVENT RESPIMAT) 20-100 MCG/ACT AERS respimat Inhale 2 puffs into the lungs daily as needed for wheezing or shortness of breath.    Yes Historical Provider, MD  KLOR-CON M20 20 MEQ tablet Take 20 mEq by mouth every morning. with food 01/04/15  Yes Historical Provider, MD  loratadine (CLARITIN) 10 MG tablet Take 10 mg by mouth every morning.    Yes Historical Provider, MD  magnesium hydroxide (MILK OF MAGNESIA) 400 MG/5ML suspension Take 5 mLs by mouth daily as needed for mild constipation. 03/24/15  Yes Costin Karlyne Greenspan, MD  Menthol-Methyl Salicylate (MUSCLE RUB) 10-15 % CREA Apply 1 application topically daily as needed for muscle pain. Left shoulder pain   Yes Historical Provider, MD  metFORMIN (GLUCOPHAGE) 500 MG tablet Take 1 tablet by mouth 2 (two) times daily. 03/30/15  Yes Historical Provider, MD  metoCLOPramide (REGLAN) 5 MG tablet Take 1  tablet (5 mg total) by mouth 3 (three) times daily before meals. 03/12/15  Yes Oswald Hillock, MD  metoprolol tartrate (LOPRESSOR) 25 MG tablet Take 25 mg by mouth daily. 05/24/15  Yes Historical Provider, MD  olmesartan (BENICAR) 40 MG tablet Take 1 tablet (40 mg total) by mouth daily. 05/06/15  Yes Modena Jansky, MD  omeprazole (PRILOSEC) 20 MG capsule Take 1 capsule (20 mg total) by mouth 2 (two) times daily before a meal. 03/26/15  Yes Tyler Pita, MD  ondansetron (ZOFRAN-ODT) 8 MG disintegrating tablet Take 1 tablet (8 mg total) by mouth every 6 (six) hours as needed for nausea or vomiting. 04/28/15  Yes Truitt Merle, MD  OXcarbazepine (TRILEPTAL) 150 MG tablet Take 150 mg by mouth 3 (three) times daily.  11/24/13  Yes Historical Provider, MD  OxyCODONE (OXYCONTIN) 20 mg T12A 12 hr tablet Take 3 tablets (60 mg total) by mouth 3 (three) times daily. Patient taking differently: Take 20-40 mg by mouth 3 (three) times daily.  05/20/15  Yes Truitt Merle, MD  Oxycodone HCl 10 MG TABS Take 1-2 tablets (10-20 mg total) by mouth every 4 (four) hours as needed (moderate pain, severe pain, breakthrough pain). 05/20/15  Yes Truitt Merle, MD  polyethylene glycol (MIRALAX / GLYCOLAX) packet Take 17 g by mouth 2 (two) times daily. 05/06/15  Yes Modena Jansky, MD  senna-docusate (SENOKOT-S) 8.6-50 MG per tablet Take 2 tablets by mouth 2 (two) times daily. 05/06/15  Yes Modena Jansky, MD  simvastatin (ZOCOR) 20 MG tablet Take 20 mg by mouth at bedtime.    Yes Historical Provider, MD  Vitamin D, Ergocalciferol, (DRISDOL) 50000 UNITS CAPS capsule Take 50,000 Units by mouth every Monday.    Yes Historical Provider, MD  feeding supplement, RESOURCE BREEZE, (RESOURCE BREEZE) LIQD Take 1 Container by mouth 3 (three) times daily between meals. Patient not taking: Reported on 05/20/2015 05/06/15   Modena Jansky, MD  folic acid (FOLVITE) 1 MG tablet Take 1 tablet (1 mg total) by mouth daily. Patient not taking: Reported on 06/02/2015  03/23/15   Truitt Merle, MD  nicotine (NICODERM CQ - DOSED IN MG/24 HOURS) 21 mg/24hr patch Place 1 patch (21 mg total) onto the skin daily. Patient not taking: Reported on 05/20/2015 05/06/15   Modena Jansky, MD   Physical Exam: Filed Vitals:   06/02/15 1403 06/02/15 1417 06/02/15 1423 06/02/15 1643  BP: 118/87 116/60  137/74  Pulse: 100 93  93  Temp: 98.6 F (37 C)  100.7 F (38.2 C)   TempSrc: Oral  Rectal   Resp: '20 15  18  '$ SpO2: 93% 95%  95%    Wt Readings from Last 3 Encounters:  05/20/15 54.023 kg (119 lb 1.6 oz)  04/30/15 55.929 kg (123 lb 4.8 oz)  04/28/15 55.929 kg (123 lb 4.8 oz)    General:   mild distress due to pain. : PERRL, normal lids, irises & conjunctiva ENT: grossly normal hearing, lips & tongue were dry  Neck: no LAD, masses or thyromegaly Cardiovascular: RRR, no m/r/g. No LE edema. Telemetry: SR, no arrhythmias  Respiratory: CTA bilaterally, no w/r/r. Normal respiratory effort. Abdomen: soft, ntnd Skin: no rash or induration seen on limited exam Musculoskeletal: grossly normal tone BUE/BLE Psychiatric: Awake alert oriented 3       Neurologic: grossly non-focal.          Labs on Admission:  Basic Metabolic Panel:  Recent Labs Lab 06/02/15 1524  NA 136  K 5.4  CL 104  CO2 24  GLUCOSE 120  BUN 43  CREATININE 1.05  CALCIUM 8.4   Liver Function Tests:  Recent Labs Lab 06/02/15 1524  AST 27  ALT 15  ALKPHOS 427  BILITOT 0.4  PROT 6.2  ALBUMIN 3.4   No results for input(s): LIPASE, AMYLASE in the last 168 hours. No results for input(s): AMMONIA in the last 168 hours. CBC:  Recent Labs Lab 06/02/15 1524 06/02/15 1614  WBC 9.5  --   NEUTROABS 7.0  --   HGB 5.5 5.7  HCT 17.6 18.6  MCV 93.6  --   PLT 14  --    Cardiac Enzymes: No results for input(s): CKTOTAL, CKMB, CKMBINDEX, TROPONINI in the last 168 hours.  BNP (last 3 results)  No results for input(s): BNP in the last 8760 hours.  ProBNP (last 3 results) No results for  input(s): PROBNP in the last 8760 hours.  CBG: No results for input(s): GLUCAP in the last 168 hours.  Radiological Exams on Admission: Dg Chest 2 View  06/02/2015   CLINICAL DATA:  Fever.  Metastatic lung carcinoma  EXAM: CHEST  2 VIEW  COMPARISON:  Chest radiograph April 29, 2015; PET-CT Apr 01, 2015  FINDINGS: There is postoperative change on the right. The interstitium is mildly prominent but stable. There is no frank edema or consolidation. The heart is borderline enlarged with pulmonary vascular within normal limits. No adenopathy is appreciable. Port-A-Cath tip is in the superior vena cava. No pneumothorax. There is widespread bony metastatic disease, better appreciated on the PET study. There is postoperative change in the cervical spine. There is mid thoracic dextroscoliosis.  IMPRESSION: No edema or consolidation. No mass appreciable. Diffuse interstitial prominence. Question lymphangitic spread of tumor versus interstitial prominence due to allergic type phenomenon from chemotherapy. Widespread bony metastatic disease, better appreciated on the recent PET study. No adenopathy apparent.   Electronically Signed   By: Lowella Grip III M.D.   On: 06/02/2015 14:47   Ct Head Wo Contrast  06/02/2015   CLINICAL DATA:  Metastatic lung cancer.  Altered mental status.  EXAM: CT HEAD WITHOUT CONTRAST  TECHNIQUE: Contiguous axial images were obtained from the base of the skull through the vertex without intravenous contrast.  COMPARISON:  MR brain 02/26/2015  FINDINGS: There is no evidence of mass effect, midline shift or extra-axial fluid collections. There is no evidence of a space-occupying lesion or intracranial hemorrhage. There is no evidence of a cortical-based area of acute infarction. There is left frontal lobe encephalomalacia from prior insult.  The ventricles and sulci are appropriate for the patient's age. The basal cisterns are patent.  Visualized portions of the orbits are unremarkable. The  visualized portions of the paranasal sinuses and mastoid air cells are unremarkable.  There is diffuse calvarial metastatic disease. There is evidence of prior left frontal craniotomy.  IMPRESSION: 1. No acute intracranial pathology. 2. Evaluation for metastatic disease is limited secondary to lack of intravenous contrast. 3. Stable left frontal lobe encephalomalacia. 4. Osseous metastatic disease throughout the calvarium.   Electronically Signed   By: Kathreen Devoid   On: 06/02/2015 15:47    EKG: Independently reviewed.  Assessment/Plan    Symptomatic anemia   Metastatic primary lung cancer   Uncontrolled pain  Diabetes mellitus 2  constipation due to opiate therapy  history of hepatitis C Hypertension   I will admit for blood transfusion, pain management and continue her regular home meds. Please consult palliative care in the morning.     Code Status:Full DVT Prophylaxis: Lovenox  Family CommunicationRana, [place] Daughter [phone]    Mobile phone number [phone] Disposition Plan: Home after the patient gets consulted by the palliative care team has recommended by oncology.   Time spent: 60 minutes  Reubin Milan [facility] Pager [phone]

## 2015-06-03 DIAGNOSIS — K5909 Other constipation: Secondary | ICD-10-CM

## 2015-06-03 DIAGNOSIS — T402X5A Adverse effect of other opioids, initial encounter: Secondary | ICD-10-CM

## 2015-06-03 DIAGNOSIS — D649 Anemia, unspecified: Secondary | ICD-10-CM | POA: Diagnosis not present

## 2015-06-03 LAB — CBC
HCT: 27.7 % — ABNORMAL LOW (ref 36.0–46.0)
HEMOGLOBIN: 9 g/dL — AB (ref 12.0–15.0)
MCH: 28.9 pg (ref 26.0–34.0)
MCHC: 32.5 g/dL (ref 30.0–36.0)
MCV: 89.1 fL (ref 78.0–100.0)
Platelets: 16 10*3/uL — CL (ref 150–400)
RBC: 3.11 MIL/uL — AB (ref 3.87–5.11)
RDW: 19.2 % — AB (ref 11.5–15.5)
WBC: 9 10*3/uL (ref 4.0–10.5)

## 2015-06-03 LAB — COMPREHENSIVE METABOLIC PANEL
ALT: 19 U/L (ref 14–54)
AST: 33 U/L (ref 15–41)
Albumin: 3.5 g/dL (ref 3.5–5.0)
Alkaline Phosphatase: 412 U/L — ABNORMAL HIGH (ref 38–126)
Anion gap: 6 (ref 5–15)
BILIRUBIN TOTAL: 0.9 mg/dL (ref 0.3–1.2)
BUN: 24 mg/dL — ABNORMAL HIGH (ref 6–20)
CALCIUM: 8.7 mg/dL — AB (ref 8.9–10.3)
CO2: 27 mmol/L (ref 22–32)
CREATININE: 0.66 mg/dL (ref 0.44–1.00)
Chloride: 103 mmol/L (ref 101–111)
GFR calc Af Amer: >60 mL/min (ref >60)
GFR calc non Af Amer: >60 mL/min (ref >60)
GLUCOSE: 86 mg/dL (ref 65–99)
Potassium: 4.5 mmol/L (ref 3.5–5.1)
SODIUM: 136 mmol/L (ref 135–145)
Total Protein: 6.3 g/dL — ABNORMAL LOW (ref 6.5–8.1)

## 2015-06-03 LAB — GLUCOSE, CAPILLARY
GLUCOSE-CAPILLARY: 78 mg/dL (ref 65–99)
GLUCOSE-CAPILLARY: 94 mg/dL (ref 65–99)
GLUCOSE-CAPILLARY: 102 mg/dL — AB (ref 65–99)
Glucose-Capillary: 119 mg/dL — ABNORMAL HIGH (ref 65–99)

## 2015-06-03 MED ORDER — OXYCODONE HCL 5 MG PO TABS
10.0000 mg | ORAL_TABLET | ORAL | Status: DC | PRN
  Administered 2015-06-04 – 2015-06-08 (×8): 10 mg via ORAL
  Filled 2015-06-03 (×8): qty 2

## 2015-06-03 MED ORDER — HYDROMORPHONE HCL 1 MG/ML IJ SOLN
1.0000 mg | INTRAMUSCULAR | Status: DC | PRN
  Administered 2015-06-04 – 2015-06-08 (×35): 1 mg via INTRAVENOUS
  Filled 2015-06-03 (×35): qty 1

## 2015-06-03 NOTE — Discharge Instructions (Signed)
[facility] Stay Proper nutrition can help your body recover from illness and injury.   Foods and beverages high in protein, vitamins, and minerals help rebuild muscle loss, promote healing, & reduce fall risk.   In addition to eating healthy foods, a nutrition shake is an easy, delicious way to get the nutrition you need during and after your hospital stay  It is recommended that you continue to drink 2 bottles per day of:       Boost for at least 1 month (30 days) after your hospital stay   Tips for adding a nutrition shake into your routine: As allowed, drink one with vitamins or medications instead of water or juice Enjoy one as a tasty mid-morning or afternoon snack Drink cold or make a milkshake out of it Drink one instead of milk with cereal or snacks Use as a coffee creamer   Available at the following grocery stores and pharmacies:           * [place] [phone]            For COUPONS visit: www.ensure.com/join or [email or web address]-[email or web address]   Suggested Substitutions Ensure Plus = Boost Plus = Carnation Breakfast Essentials = Boost Compact Ensure Active Clear = Boost Breeze Glucerna Shake = Boost Glucose Control = Carnation Breakfast Essentials SUGAR FREE

## 2015-06-03 NOTE — Progress Notes (Addendum)
Patient ID: [name] [name], female   DOB: 1951-08-15, 64 y.o.   MRN: [id number] [facility] PROGRESS NOTE  [name] [name] GUY:[id number] DOB: 21-Mar-1951 DOA: 06/02/2015 PCP: Harvie Junior, MD   Brief narrative:    64 y.o. female with known stage IV RUL lung cancer with metastasis to the entire spine per last imaging studies in April 2016, follows with Dr. Burr Medico, not currently receiving chemotherapy as oncology team has determined she is not a good candidate for chemo (pt apparently has known history of non compliance and her therapy has been delayed several time and eventually with severe thrombocytopenia she was no longer candidate). She presented to [facility] ED by her daughter for evaluation of progressive shortness of breath and fatigue, diffuse body aches.   In ED, blood work notable for hg 5.5 and Plt 14 K, K 5.4 and Cr 1.05. [facility] asked to admit for further evaluation.   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 Burr Medico via EPIC order set - will also consult palliative care team for assistance    Acute on chronic pain secondary to metastatic illness - allow dilaudid as needed in addition to home medical regimen - follow upon palliative team recommendations    Hyperkalemia - unclear etiology -  resolved     Acute renal failure  - with BUN 43 and Cr 1.05 in the setting of acute blood loss anemia - now resolved with transfusion  - repeat BMP in AM    Hypocalcemia - correct to normal with albumin taken into the account     Opioid induced constipation - place on Senna and Miralax and readjust as needed     Hepatitis C - unknown when it was diagnosed     Hypertension - continue home medical regimen with Irbesartan and Metoprolol     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  Family Communication:  plan of care discussed with the patient Disposition Plan: NOt ready for discharge at this AM   IV access:  Peripheral IV  Procedures and diagnostic studies:    Dg Chest 2 View 06/02/2015   No edema or consolidation. No mass appreciable. Diffuse interstitial prominence. Question lymphangitic spread of tumor versus interstitial prominence due to allergic type phenomenon from chemotherapy. Widespread bony metastatic disease, better appreciated on the recent PET study. No adenopathy apparent.   Ct Head Wo Contrast 06/02/2015  No acute intracranial pathology. 2. Evaluation for metastatic disease  is limited secondary to lack of intravenous contrast. 3. Stable left frontal lobe encephalomalacia. 4. Osseous metastatic disease throughout the calvarium.     Medical Consultants:  Palliative care   Other Consultants:  None  IAnti-Infectives:   None   Faye Ramsay, MD  [facility] Pager [phone]  If 7PM-7AM, please contact night-coverage [email or web address] Password [facility] 06/03/2015, 6:31 PM  HPI/Subjective: No events overnight.   Objective: Filed Vitals:   06/03/15 0500 06/03/15 0517 06/03/15  0600 06/03/15 1340  BP: 186/88 174/90 142/82 156/85  Pulse: 94 112 96 107  Temp: 99.3 F (37.4 C) 99.9 F (37.7 C) 99.3 F (37.4 C) 98.9 F (37.2 C)  TempSrc: Oral Oral Oral Oral  Resp: '18 18 18 18  '$ Height:      Weight:      SpO2: 98% 97% 98% 99%    Intake/Output Summary (Last 24 hours) at 06/03/15 1831 Last data filed at 06/03/15 1100  Gross per 24 hour  Intake    910 ml  Output    900 ml  Net     10 ml    Exam:   General:  Pt is alert, follows commands appropriately, not in acute distress  Cardiovascular: Regular rhythm, tachycardic, S1/S2, no murmurs, no rubs, no gallops  Respiratory: Clear to auscultation bilaterally, no wheezing, no crackles, no rhonchi  Abdomen: Soft, non tender, non distended, bowel sounds present, no guarding  Data Reviewed: Basic Metabolic Panel:  Recent Labs Lab 06/02/15 1524 06/03/15 0500  NA 136 136  K 5.4* 4.5  CL 104 103  CO2 24 27  GLUCOSE 120* 86  BUN 43* 24*  CREATININE 1.05* 0.66  CALCIUM 8.4* 8.7*   Liver Function Tests:  Recent Labs Lab 06/02/15 1524 06/03/15 0500  AST 27 33  ALT 15 19  ALKPHOS 427* 412*  BILITOT 0.4 0.9  PROT 6.2* 6.3*  ALBUMIN 3.4* 3.5   CBC:  Recent Labs Lab 06/02/15 1524 06/02/15 1614 06/03/15 0500  WBC 9.5  --  9.0  NEUTROABS 7.0  --   --   HGB 5.5* 5.7* 9.0*  HCT 17.6* 18.6* 27.7*  MCV 93.6  --  89.1  PLT 14*  --  16*   CBG:  Recent Labs Lab 06/02/15 2217 06/03/15 0749 06/03/15 1148  GLUCAP 151* 78 94    Recent Results (from the past 240 hour(s))  Culture, blood (routine x 2)     Status: None (Preliminary result)   Collection Time: 06/02/15  2:55 PM  Result Value Ref Range Status   Specimen Description BLOOD RIGHT HAND  Final   Special Requests BOTTLES DRAWN AEROBIC ONLY 2CC  Final   Culture   Final    NO GROWTH < 24 HOURS Performed at [facility]    Report Status PENDING  Incomplete  Culture, blood (routine x 2)     Status: None (Preliminary result)    Collection Time: 06/02/15  3:00 PM  Result Value Ref Range Status   Specimen Description BLOOD PORTA CATH  Final   Special Requests BOTTLES DRAWN AEROBIC AND ANAEROBIC 10CC  Final   Culture   Final    NO GROWTH < 24 HOURS Performed at [facility]    Report Status PENDING  Incomplete  Urine culture     Status: None (Preliminary result)   Collection Time: 06/02/15  4:03 PM  Result Value Ref Range Status   Specimen Description URINE, RANDOM  Final   Special Requests NONE  Final   Culture  Final    CULTURE REINCUBATED FOR BETTER GROWTH Performed at [facility]    Report Status PENDING  Incomplete     Scheduled Meds: . ALPRAZolam  0.5 mg Oral Daily  . dicyclomine  20 mg Oral TID WC  . DULoxetine  30 mg Oral Daily  . feeding supplement  1 Container Oral TID BM  . gabapentin  400 mg Oral TID  . insulin aspart  0-5 Units Subcutaneous QHS  . insulin aspart  0-9 Units Subcutaneous TID WC  . irbesartan  150 mg Oral Daily  . loratadine  10 mg Oral q morning - 10a  . metoCLOPramide  5 mg Oral TID AC  . metoprolol tartrate  25 mg Oral Daily  . OXcarbazepine  150 mg Oral TID  . OxyCODONE  20 mg Oral TID  . pantoprazole  40 mg Oral Daily  . polyethylene glycol  17 g Oral BID  . senna-docusate  2 tablet Oral BID   Continuous Infusions:

## 2015-06-03 NOTE — Progress Notes (Signed)
Initial Nutrition Assessment  DOCUMENTATION CODES:   Non-severe (moderate) malnutrition in context of chronic illness  INTERVENTION:   Continue Boost Breeze po TID, each supplement provides 250 kcal and 9 grams of protein Provide daily snacks Encourage PO intake RD to continue to monitor  NUTRITION DIAGNOSIS:   Malnutrition related to chronic illness as evidenced by mild depletion of body fat, moderate depletions of muscle mass, percent weight loss.  GOAL:   Patient will meet greater than or equal to 90% of their needs  MONITOR:   PO intake, Supplement acceptance, Labs, Weight trends, Skin, I & O's  REASON FOR ASSESSMENT:   Malnutrition Screening Tool    ASSESSMENT:   64 y.o. female with bellow past medical history which includes stage IV lung cancer with metastasis to the bone. She is not currently receiving chemotherapy because the oncology team does not believe her to be a good candidate for chemotherapy. She was brought in by her daughter today due to increased fatigue shortness of breath and confusion over the last few days. Patient also complains of constant pain due to her metastatic disease.  Pt in room eating from lunch tray. Pt ate ~80% of her lunch. Pt states that her appetite fluctuates. Pt drink Boost Breeze supplements at home. Pt states that she doesn't like Ensure. Pt would like pudding for a snack.  Per weight history documentation, pt has lost 16 lb since 5/25 (12% weight loss x 1.5 months, significant for time frame).  Nutrition-Focused physical exam completed. Findings are mild fat depletion, moderate muscle depletion, and no edema.   Labs reviewed: CBGs: 78-151 Elevated BUN  Diet Order:  Diet Carb Modified Fluid consistency:: Thin; Room service appropriate?: Yes  Skin:  Reviewed, no issues  Last BM:  7/13  Height:   Ht Readings from Last 1 Encounters:  06/02/15 '5\' 4"'$  (1.626 m)    Weight:   Wt Readings from Last 1 Encounters:  06/02/15  114 lb 8 oz (51.937 kg)    Ideal Body Weight:  54.5 kg  Wt Readings from Last 10 Encounters:  06/02/15 114 lb 8 oz (51.937 kg)  05/20/15 119 lb 1.6 oz (54.023 kg)  04/30/15 123 lb 4.8 oz (55.929 kg)  04/28/15 123 lb 4.8 oz (55.929 kg)  04/14/15 130 lb 4.8 oz (59.104 kg)  04/02/15 125 lb 10.6 oz (57 kg)  03/30/15 129 lb 8 oz (58.741 kg)  03/26/15 133 lb 1.6 oz (60.374 kg)  03/23/15 131 lb 9.6 oz (59.693 kg)  03/16/15 132 lb 6.4 oz (60.056 kg)    BMI:  Body mass index is 19.64 kg/(m^2).  Estimated Nutritional Needs:   Kcal:  1500-1700  Protein:  75-85g  Fluid:  1.6L/day  EDUCATION NEEDS:   No education needs identified at this time  Clayton Bibles, MS, RD, LDN Pager: [phone] After Hours Pager: [phone]

## 2015-06-04 DIAGNOSIS — C7951 Secondary malignant neoplasm of bone: Secondary | ICD-10-CM | POA: Diagnosis present

## 2015-06-04 DIAGNOSIS — Z7952 Long term (current) use of systemic steroids: Secondary | ICD-10-CM | POA: Diagnosis not present

## 2015-06-04 DIAGNOSIS — N179 Acute kidney failure, unspecified: Secondary | ICD-10-CM | POA: Diagnosis present

## 2015-06-04 DIAGNOSIS — J449 Chronic obstructive pulmonary disease, unspecified: Secondary | ICD-10-CM | POA: Diagnosis present

## 2015-06-04 DIAGNOSIS — B182 Chronic viral hepatitis C: Secondary | ICD-10-CM | POA: Diagnosis not present

## 2015-06-04 DIAGNOSIS — T402X5A Adverse effect of other opioids, initial encounter: Secondary | ICD-10-CM | POA: Diagnosis present

## 2015-06-04 DIAGNOSIS — C3490 Malignant neoplasm of unspecified part of unspecified bronchus or lung: Secondary | ICD-10-CM | POA: Diagnosis not present

## 2015-06-04 DIAGNOSIS — K3184 Gastroparesis: Secondary | ICD-10-CM | POA: Diagnosis present

## 2015-06-04 DIAGNOSIS — D696 Thrombocytopenia, unspecified: Secondary | ICD-10-CM | POA: Diagnosis not present

## 2015-06-04 DIAGNOSIS — F319 Bipolar disorder, unspecified: Secondary | ICD-10-CM | POA: Diagnosis present

## 2015-06-04 DIAGNOSIS — C3411 Malignant neoplasm of upper lobe, right bronchus or lung: Secondary | ICD-10-CM | POA: Diagnosis present

## 2015-06-04 DIAGNOSIS — D630 Anemia in neoplastic disease: Secondary | ICD-10-CM | POA: Diagnosis present

## 2015-06-04 DIAGNOSIS — Z833 Family history of diabetes mellitus: Secondary | ICD-10-CM | POA: Diagnosis not present

## 2015-06-04 DIAGNOSIS — E1165 Type 2 diabetes mellitus with hyperglycemia: Secondary | ICD-10-CM | POA: Diagnosis not present

## 2015-06-04 DIAGNOSIS — K5909 Other constipation: Secondary | ICD-10-CM | POA: Diagnosis not present

## 2015-06-04 DIAGNOSIS — Z66 Do not resuscitate: Secondary | ICD-10-CM | POA: Diagnosis present

## 2015-06-04 DIAGNOSIS — Z79891 Long term (current) use of opiate analgesic: Secondary | ICD-10-CM | POA: Diagnosis not present

## 2015-06-04 DIAGNOSIS — R0602 Shortness of breath: Secondary | ICD-10-CM | POA: Diagnosis present

## 2015-06-04 DIAGNOSIS — Z515 Encounter for palliative care: Secondary | ICD-10-CM | POA: Diagnosis not present

## 2015-06-04 DIAGNOSIS — F419 Anxiety disorder, unspecified: Secondary | ICD-10-CM | POA: Diagnosis present

## 2015-06-04 DIAGNOSIS — R52 Pain, unspecified: Secondary | ICD-10-CM

## 2015-06-04 DIAGNOSIS — C799 Secondary malignant neoplasm of unspecified site: Secondary | ICD-10-CM | POA: Diagnosis not present

## 2015-06-04 DIAGNOSIS — K219 Gastro-esophageal reflux disease without esophagitis: Secondary | ICD-10-CM | POA: Diagnosis present

## 2015-06-04 DIAGNOSIS — Z8249 Family history of ischemic heart disease and other diseases of the circulatory system: Secondary | ICD-10-CM | POA: Diagnosis not present

## 2015-06-04 DIAGNOSIS — E46 Unspecified protein-calorie malnutrition: Secondary | ICD-10-CM | POA: Diagnosis present

## 2015-06-04 DIAGNOSIS — E1143 Type 2 diabetes mellitus with diabetic autonomic (poly)neuropathy: Secondary | ICD-10-CM | POA: Diagnosis present

## 2015-06-04 DIAGNOSIS — G8929 Other chronic pain: Secondary | ICD-10-CM | POA: Diagnosis present

## 2015-06-04 DIAGNOSIS — Z9119 Patient's noncompliance with other medical treatment and regimen: Secondary | ICD-10-CM | POA: Diagnosis present

## 2015-06-04 DIAGNOSIS — I10 Essential (primary) hypertension: Secondary | ICD-10-CM | POA: Diagnosis not present

## 2015-06-04 DIAGNOSIS — B1920 Unspecified viral hepatitis C without hepatic coma: Secondary | ICD-10-CM | POA: Diagnosis present

## 2015-06-04 DIAGNOSIS — D649 Anemia, unspecified: Secondary | ICD-10-CM | POA: Diagnosis not present

## 2015-06-04 DIAGNOSIS — M1990 Unspecified osteoarthritis, unspecified site: Secondary | ICD-10-CM | POA: Diagnosis present

## 2015-06-04 DIAGNOSIS — Z87891 Personal history of nicotine dependence: Secondary | ICD-10-CM | POA: Diagnosis not present

## 2015-06-04 DIAGNOSIS — E1140 Type 2 diabetes mellitus with diabetic neuropathy, unspecified: Secondary | ICD-10-CM | POA: Diagnosis present

## 2015-06-04 DIAGNOSIS — D6959 Other secondary thrombocytopenia: Secondary | ICD-10-CM | POA: Diagnosis present

## 2015-06-04 DIAGNOSIS — T40605A Adverse effect of unspecified narcotics, initial encounter: Secondary | ICD-10-CM | POA: Diagnosis present

## 2015-06-04 DIAGNOSIS — E875 Hyperkalemia: Secondary | ICD-10-CM | POA: Diagnosis present

## 2015-06-04 DIAGNOSIS — Z681 Body mass index (BMI) 19 or less, adult: Secondary | ICD-10-CM | POA: Diagnosis not present

## 2015-06-04 DIAGNOSIS — D62 Acute posthemorrhagic anemia: Secondary | ICD-10-CM | POA: Diagnosis present

## 2015-06-04 LAB — GLUCOSE, CAPILLARY
GLUCOSE-CAPILLARY: 143 mg/dL — AB (ref 65–99)
Glucose-Capillary: 105 mg/dL — ABNORMAL HIGH (ref 65–99)
Glucose-Capillary: 142 mg/dL — ABNORMAL HIGH (ref 65–99)
Glucose-Capillary: 161 mg/dL — ABNORMAL HIGH (ref 65–99)

## 2015-06-04 LAB — TYPE AND SCREEN
ABO/RH(D): O POS
Antibody Screen: NEGATIVE
UNIT DIVISION: 0
Unit division: 0

## 2015-06-04 LAB — CBC
HCT: 34.3 % — ABNORMAL LOW (ref 36.0–46.0)
Hemoglobin: 11.6 g/dL — ABNORMAL LOW (ref 12.0–15.0)
MCH: 29.7 pg (ref 26.0–34.0)
MCHC: 33.8 g/dL (ref 30.0–36.0)
MCV: 87.9 fL (ref 78.0–100.0)
Platelets: 13 10*3/uL — CL (ref 150–400)
RBC: 3.9 MIL/uL (ref 3.87–5.11)
RDW: 19.3 % — ABNORMAL HIGH (ref 11.5–15.5)
WBC: 10.5 10*3/uL (ref 4.0–10.5)

## 2015-06-04 LAB — BASIC METABOLIC PANEL
Anion gap: 9 (ref 5–15)
BUN: 16 mg/dL (ref 6–20)
CALCIUM: 8.9 mg/dL (ref 8.9–10.3)
CHLORIDE: 100 mmol/L — AB (ref 101–111)
CO2: 28 mmol/L (ref 22–32)
Creatinine, Ser: 0.6 mg/dL (ref 0.44–1.00)
GFR calc Af Amer: >60 mL/min (ref >60)
Glucose, Bld: 98 mg/dL (ref 65–99)
POTASSIUM: 3.8 mmol/L (ref 3.5–5.1)
Sodium: 137 mmol/L (ref 135–145)

## 2015-06-04 MED ORDER — OXYCODONE HCL ER 40 MG PO T12A
40.0000 mg | EXTENDED_RELEASE_TABLET | Freq: Two times a day (BID) | ORAL | Status: DC
  Administered 2015-06-04 – 2015-06-05 (×3): 40 mg via ORAL
  Filled 2015-06-04 (×3): qty 1

## 2015-06-04 NOTE — Progress Notes (Signed)
Progress Note   [name] [name] AGT:[id number] DOB: 1951-01-19 DOA: 06/02/2015 PCP: Harvie Junior, MD   Brief Narrative:   [name] [name] is an 64 y.o. 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 Upon initial evaluation, hemoglobin was 5.5, platelets 14, potassium 5.4 and creatinine 1.05.  Assessment/Plan:   Principal Problem:   Symptomatic anemia / anemia associated with malignancy - Status post 4 U PRBC transfusion and with appropriate increase in post transfusion Hg.  Active Problems:   Severe thrombocytopenia associated with malignancy - Transfuse as needed for platelets less than 10,000 or active bleeding.    Hepatitis C - Transaminase levels WNL. Alkaline phosphatase slightly elevated, likely from bony metastasis.    Hypertension - Continue home medical regimen with Irbesartan and Metoprolol.     Diabetes mellitus - Metformin on hold. Currently being managed with SSI, insulin sensitive scale Q AC/HS. CBG 78-151.    Metastatic primary lung cancer, stage IV - Oncology notified, however has not been a candidate for treatment in the past.    Uncontrolled pain - Continue pain control efforts.  Increase Oxycontin to 40 mg BID from 20 mg TID.    Constipation due to opioid therapy - Continue bowel regimen.    DVT Prophylaxis - SCDs ordered.  Family Communication: No family at bedside.  Declines my offer to call, saying she will visit today. Disposition Plan: Home with daughter when stable. Code Status:     Code Status Orders        Start     Ordered   06/02/15 2054  Full code   Continuous     06/02/15 2053        IV Access:    Peripheral IV   Procedures and diagnostic studies:   Dg Chest 2  View  06/02/2015   CLINICAL DATA:  Fever.  Metastatic lung carcinoma  EXAM: CHEST  2 VIEW  COMPARISON:  Chest radiograph April 29, 2015; PET-CT Apr 01, 2015  FINDINGS: There is postoperative change on the right. The interstitium is mildly prominent but stable. There is no frank edema or consolidation. The heart is borderline enlarged with pulmonary vascular within normal limits. No adenopathy is appreciable. Port-A-Cath tip is in the superior vena cava. No pneumothorax. There is widespread bony metastatic disease, better appreciated on the PET study. There is postoperative change in the cervical spine. There is mid thoracic dextroscoliosis.  IMPRESSION: No edema or consolidation. No mass appreciable. Diffuse interstitial prominence. Question lymphangitic spread of tumor versus interstitial prominence due to allergic type phenomenon from chemotherapy. Widespread bony metastatic disease, better appreciated on the recent PET study. No adenopathy apparent.   Electronically Signed   By: Lowella Grip III M.D.   On: 06/02/2015 14:47   Ct Head Wo Contrast  06/02/2015   CLINICAL DATA:  Metastatic lung cancer.  Altered mental status.  EXAM: CT HEAD WITHOUT CONTRAST  TECHNIQUE: Contiguous axial images were obtained from the base of the skull through the vertex without intravenous contrast.  COMPARISON:  MR brain 02/26/2015  FINDINGS: There is no evidence of mass effect, midline shift or extra-axial fluid collections. There is no evidence of a space-occupying lesion or intracranial hemorrhage. There is no evidence of a cortical-based area of acute infarction. There is left frontal  lobe encephalomalacia from prior insult.  The ventricles and sulci are appropriate for the patient's age. The basal cisterns are patent.  Visualized portions of the orbits are unremarkable. The visualized portions of the paranasal sinuses and mastoid air cells are unremarkable.  There is diffuse calvarial metastatic disease. There is evidence  of prior left frontal craniotomy.  IMPRESSION: 1. No acute intracranial pathology. 2. Evaluation for metastatic disease is limited secondary to lack of intravenous contrast. 3. Stable left frontal lobe encephalomalacia. 4. Osseous metastatic disease throughout the calvarium.   Electronically Signed   By: Kathreen Devoid   On: 06/02/2015 15:47     Medical Consultants:    None.  Anti-Infectives:    None.  Subjective:   [name] [name] says "I'm sick as a dog".  Denies nausea/vomiting.  Reports 10/10 back/stomach pain.  Had a normal BM this a.m.  Appetite poor.  Objective:    Filed Vitals:   06/03/15 1340 06/03/15 2055 06/04/15 0536 06/04/15 0642  BP: 156/85 152/95 180/94 142/90  Pulse: 107 115 116 100  Temp: 98.9 F (37.2 C) 99.6 F (37.6 C) 99.5 F (37.5 C) 98.9 F (37.2 C)  TempSrc: Oral Oral Oral Oral  Resp: '18 16 16 20  '$ Height:      Weight:      SpO2: 99% 99% 100%     Intake/Output Summary (Last 24 hours) at 06/04/15 0752 Last data filed at 06/03/15 2100  Gross per 24 hour  Intake    480 ml  Output   1800 ml  Net  -1320 ml    Exam: Gen:  NAD Cardiovascular:  Tachy, No M/R/G, hyperdynamic prechordium Respiratory:  Lungs CTAB Gastrointestinal:  Abdomen soft, NT/ND, + BS Extremities:  No C/E/C   Data Reviewed:    Labs: Basic Metabolic Panel:  Recent Labs Lab 06/02/15 1524 06/03/15 0500 06/04/15 0500  NA 136 136 137  K 5.4* 4.5 3.8  CL 104 103 100*  CO2 '24 27 28  '$ GLUCOSE 120* 86 98  BUN 43* 24* 16  CREATININE 1.05* 0.66 0.60  CALCIUM 8.4* 8.7* 8.9   GFR Estimated Creatinine Clearance: 56.7 mL/min (by C-G formula based on Cr of 0.6). Liver Function Tests:  Recent Labs Lab 06/02/15 1524 06/03/15 0500  AST 27 33  ALT 15 19  ALKPHOS 427* 412*  BILITOT 0.4 0.9  PROT 6.2* 6.3*  ALBUMIN 3.4* 3.5   CBC:  Recent Labs Lab 06/02/15 1524 06/02/15 1614 06/03/15 0500 06/04/15 0500  WBC 9.5  --  9.0 10.5  NEUTROABS 7.0  --   --   --   HGB  5.5* 5.7* 9.0* 11.6*  HCT 17.6* 18.6* 27.7* 34.3*  MCV 93.6  --  89.1 87.9  PLT 14*  --  16* 13*   CBG:  Recent Labs Lab 06/02/15 2217 06/03/15 0749 06/03/15 1148 06/03/15 1705 06/03/15 2107  GLUCAP 151* 78 94 102* 119*   Anemia work up:  Recent Labs  06/02/15 1614  VITAMINB12 1141*  FOLATE 12.4  FERRITIN 761*  TIBC 346  IRON 222*  RETICCTPCT 3.3*   Sepsis Labs:  Recent Labs Lab 06/02/15 1521 06/02/15 1524 06/02/15 1810 06/03/15 0500 06/04/15 0500  WBC  --  9.5  --  9.0 10.5  LATICACIDVEN 3.51*  --  2.32*  --   --    Microbiology Recent Results (from the past 240 hour(s))  Culture, blood (routine x 2)     Status: None (Preliminary result)   Collection Time: 06/02/15  2:55  PM  Result Value Ref Range Status   Specimen Description BLOOD RIGHT HAND  Final   Special Requests BOTTLES DRAWN AEROBIC ONLY 2CC  Final   Culture   Final    NO GROWTH < 24 HOURS Performed at [facility]    Report Status PENDING  Incomplete  Culture, blood (routine x 2)     Status: None (Preliminary result)   Collection Time: 06/02/15  3:00 PM  Result Value Ref Range Status   Specimen Description BLOOD PORTA CATH  Final   Special Requests BOTTLES DRAWN AEROBIC AND ANAEROBIC 10CC  Final   Culture   Final    NO GROWTH < 24 HOURS Performed at [facility]    Report Status PENDING  Incomplete  Urine culture     Status: None (Preliminary result)   Collection Time: 06/02/15  4:03 PM  Result Value Ref Range Status   Specimen Description URINE, RANDOM  Final   Special Requests NONE  Final   Culture   Final    CULTURE REINCUBATED FOR BETTER GROWTH Performed at [facility]    Report Status PENDING  Incomplete     Medications:   . ALPRAZolam  0.5 mg Oral Daily  . dicyclomine  20 mg Oral TID WC  . DULoxetine  30 mg Oral Daily  . feeding supplement  1 Container Oral TID BM  . gabapentin  400 mg Oral TID  . insulin aspart  0-5 Units Subcutaneous QHS  .  insulin aspart  0-9 Units Subcutaneous TID WC  . irbesartan  150 mg Oral Daily  . loratadine  10 mg Oral q morning - 10a  . metoCLOPramide  5 mg Oral TID AC  . metoprolol tartrate  25 mg Oral Daily  . OXcarbazepine  150 mg Oral TID  . OxyCODONE  20 mg Oral TID  . pantoprazole  40 mg Oral Daily  . polyethylene glycol  17 g Oral BID  . senna-docusate  2 tablet Oral BID  . [START ON 06/07/2015] Vitamin D (Ergocalciferol)  50,000 Units Oral Q Mon   Continuous Infusions:   Time spent: 35 minutes.  The patient is medically complex and requires high complexity decision making.      [facility] Pager [phone]. If unable to reach me by pager, please call my cell phone at [phone].  *Please refer to [email or web address], password [email or web address] to get updated schedule on who will round on this patient, as hospitalists switch teams weekly. If 7PM-7AM, please contact night-coverage at [email or web address], password [email or web address] for any overnight needs.  06/04/2015, 7:52 AM

## 2015-06-04 NOTE — Consult Note (Signed)
Consultation Note Date: 06/04/2015   Patient Name: [name] [name]  DOB: 09/15/1951  MRN: [id number]  Age / Sex: 64 y.o., female   PCP: Harvie Junior, MD Referring Physician: Venetia Maxon Rama, MD  Reason for Consultation: Establishing goals of care  Palliative Care Assessment and Plan Summary of Established Goals of Care and Medical Treatment Preferences   Anisah Kuck is an 64 y.o. 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 Upon initial evaluation, hemoglobin was 5.5, platelets 14, potassium 5.4 and creatinine 1.05.  Upon admission to the hospital, patient has received packed red blood cell transfusion. She has been started on pain regimen with her OxyContin scheduled as well as OxyIR. She has also been given Dilaudid IV when necessary. She has been started on a bowel regimen. Patient known to the palliative service from previous hospitalizations for symptom management. Palliative consult her for further discussions of goals of care.  Discussed with patient that the patient has rapidly declining the functional status because of her malignancy. Discussed recurrent anemia, fatigue, low platelets. Discussed patient not a candidate for being started on any kind of chemotherapy. [name] weak and fatigued. [name] able to understand and respond appropriately. All questions answered to the best of my ability.  Plan: DO NOT RESUSCITATE/DO NOT INTUBATE Aggressive pain and on pain symptom management Continue conversations regarding addition of hospice services and the patient is discharged home this time. She lives at home with her daughter.  Contacts/Participants in Discussion: Primary Decision Maker:  Patient, then her daughter Yannis Gumbs  [phone] HCPOA: yes   Patient designates her daughter as her Media planner.  Code Status/Advance Care Planning:  CODE STATUS discussions undertaken. Discussed with the patient in detail at the bedside. Call placed and discussed with the patient's daughter as well. Patient has widely metastatic cancer. Her functional status has remained so poor that she has not been initiated on any kind of chemotherapy since her diagnosis. Patient has had several recurrent hospitalizations for pain control. Overall, functional status continues to decline. CODE STATUS after much discussion and deliberation has now been established as DO NOT RESUSCITATE/DO NOT INTUBATE.  Symptom Management:   [name] on scheduled OxyContin as well as OxyIR. Patient also has Dilaudid 1 mg IV every 2 hours when necessary pain. Continue to monitor.  [name] also on a bowel regimen. Continue to monitor.  Palliative Prophylaxis: Yes.  Additional Recommendations (Limitations, Scope, Preferences):  Discussed with patient's daughter that the patient will need addition of hospice services when she comes home from her current hospitalization. Discussed patient's advancing malignancy, declining functional status. Psycho-social/Spiritual:   Support System: daughter. Lives with daughter [name].  Desire for further Chaplaincy support:no  Prognosis: < 6 months  Discharge Planning: I have recommended Home with Hospice   Values: Pain control. Relief of suffering. Values and goals shifting towards comfort care. Life limiting illness: Widely metastatic lung cancer.      Chief Complaint/History of Present Illness: Weakness, low hemoglobin.  Primary Diagnoses  Present on Admission:  . Symptomatic anemia . Hepatitis C . Uncontrolled pain . Hypertension . Metastatic primary lung cancer . Constipation due to opioid therapy  Palliative Review of Systems: Noted. I have reviewed the medical record, interviewed the  patient and family, and  examined the patient. The following aspects are pertinent.  Past Medical History  Diagnosis Date  . Hypertension   . Bipolar 1 disorder   . Heart murmur   . Sciatica   . Arthritis   . Anxiety   . Diverticulosis   . Chronic low back pain   . DDD (degenerative disc disease), lumbar   . Chronic shoulder pain   . Chronic neck pain   . Hepatitis B     Unclear when initially diagnosed, labs in Epic from 03/09/13  . Hepatitis C     Unclear when initially diagnosed, labs in Epic from 03/09/13  . C. difficile diarrhea     April and February 2014  . COPD (chronic obstructive pulmonary disease)   . GERD (gastroesophageal reflux disease)   . H/O hiatal hernia   . Headache(784.0)     hx  . PONV (postoperative nausea and vomiting)   . Seizures     on meds  . Diabetes mellitus   . Adenocarcinoma of lung, stage 1   . GERD (gastroesophageal reflux disease)    History   Social History  . Marital Status: Widowed    Spouse Name: N/A  . Number of Children: N/A  . Years of Education: N/A   Occupational History  . n/a     patient draws SNN/SSI   Social History Main Topics  . Smoking status: Former Smoker -- 0.25 packs/day for 10 years    Types: Cigarettes    Quit date: 07/07/2013  . Smokeless tobacco: Never Used     Comment: Smokes pk q 3 days   marajuna aug  . Alcohol Use: No     Comment: quit 61yr ago  . Drug Use: Yes    Special: Hydrocodone, Marijuana     Comment: "maybe about once a month"  . Sexual Activity: No   Other Topics Concern  . None   Social History Narrative   Family History  Problem Relation Age of Onset  . Hypertension Father     deceased  . Diabetes Father   . Heart disease Father   . Hyperlipidemia Father   . Hypertension Mother   . Hyperlipidemia Mother   . Diabetes Mother   . Hypertension Sister   . Hyperlipidemia Sister   . Hypertension Brother   . Hyperlipidemia Brother    Scheduled Meds: . ALPRAZolam  0.5 mg Oral  Daily  . dicyclomine  20 mg Oral TID WC  . DULoxetine  30 mg Oral Daily  . feeding supplement  1 Container Oral TID BM  . gabapentin  400 mg Oral TID  . insulin aspart  0-5 Units Subcutaneous QHS  . insulin aspart  0-9 Units Subcutaneous TID WC  . irbesartan  150 mg Oral Daily  . loratadine  10 mg Oral q morning - 10a  . metoCLOPramide  5 mg Oral TID AC  . metoprolol tartrate  25 mg Oral Daily  . OXcarbazepine  150 mg Oral TID  . OxyCODONE  40 mg Oral Q12H  . pantoprazole  40 mg Oral Daily  . polyethylene glycol  17 g Oral BID  . senna-docusate  2 tablet Oral BID  . [START ON 06/07/2015] Vitamin D (Ergocalciferol)  50,000 Units Oral Q Mon   Continuous Infusions:  PRN Meds:.HYDROmorphone (DILAUDID) injection, ipratropium-albuterol, magnesium hydroxide, ondansetron, oxyCODONE Medications Prior to Admission:  Prior to Admission medications   Medication Sig Start Date End Date Taking? Authorizing Provider  ALPRAZolam (Duanne Moron 1 MG tablet Take  0.5 tablets (0.5 mg total) by mouth daily. 05/20/15  Yes Truitt Merle, MD  Cyanocobalamin (VITAMIN B-12 IJ) Inject as directed every 30 (thirty) days. Administered by Dr. York Ram around the 1st of each month   Yes Historical Provider, MD  dexamethasone (DECADRON) 4 MG tablet Take 1 tablet (4 mg total) by mouth daily. 03/12/15  Yes Oswald Hillock, MD  dicyclomine (BENTYL) 20 MG tablet Take 20 mg by mouth 3 (three) times daily with meals.   Yes Historical Provider, MD  DULoxetine (CYMBALTA) 30 MG capsule Take 1 capsule (30 mg total) by mouth daily. 05/06/15  Yes Modena Jansky, MD  feeding supplement, ENSURE ENLIVE, (ENSURE ENLIVE) LIQD Take 237 mLs by mouth 2 (two) times daily between meals. 05/06/15  Yes Modena Jansky, MD  gabapentin (NEURONTIN) 400 MG capsule Take 400 mg by mouth 3 (three) times daily. Nerve pain   Yes Historical Provider, MD  Ipratropium-Albuterol (COMBIVENT RESPIMAT) 20-100 MCG/ACT AERS respimat Inhale 2 puffs into the lungs daily  as needed for wheezing or shortness of breath.    Yes Historical Provider, MD  KLOR-CON M20 20 MEQ tablet Take 20 mEq by mouth every morning. with food 01/04/15  Yes Historical Provider, MD  loratadine (CLARITIN) 10 MG tablet Take 10 mg by mouth every morning.    Yes Historical Provider, MD  magnesium hydroxide (MILK OF MAGNESIA) 400 MG/5ML suspension Take 5 mLs by mouth daily as needed for mild constipation. 03/24/15  Yes Costin Karlyne Greenspan, MD  Menthol-Methyl Salicylate (MUSCLE RUB) 10-15 % CREA Apply 1 application topically daily as needed for muscle pain. Left shoulder pain   Yes Historical Provider, MD  metFORMIN (GLUCOPHAGE) 500 MG tablet Take 1 tablet by mouth 2 (two) times daily. 03/30/15  Yes Historical Provider, MD  metoCLOPramide (REGLAN) 5 MG tablet Take 1 tablet (5 mg total) by mouth 3 (three) times daily before meals. 03/12/15  Yes Oswald Hillock, MD  metoprolol tartrate (LOPRESSOR) 25 MG tablet Take 25 mg by mouth daily. 05/24/15  Yes Historical Provider, MD  olmesartan (BENICAR) 40 MG tablet Take 1 tablet (40 mg total) by mouth daily. 05/06/15  Yes Modena Jansky, MD  omeprazole (PRILOSEC) 20 MG capsule Take 1 capsule (20 mg total) by mouth 2 (two) times daily before a meal. 03/26/15  Yes Tyler Pita, MD  ondansetron (ZOFRAN-ODT) 8 MG disintegrating tablet Take 1 tablet (8 mg total) by mouth every 6 (six) hours as needed for nausea or vomiting. 04/28/15  Yes Truitt Merle, MD  OXcarbazepine (TRILEPTAL) 150 MG tablet Take 150 mg by mouth 3 (three) times daily.  11/24/13  Yes Historical Provider, MD  OxyCODONE (OXYCONTIN) 20 mg T12A 12 hr tablet Take 3 tablets (60 mg total) by mouth 3 (three) times daily. Patient taking differently: Take 20 mg by mouth 3 (three) times daily.  05/20/15  Yes Truitt Merle, MD  Oxycodone HCl 10 MG TABS Take 1-2 tablets (10-20 mg total) by mouth every 4 (four) hours as needed (moderate pain, severe pain, breakthrough pain). 05/20/15  Yes Truitt Merle, MD  polyethylene glycol (MIRALAX /  GLYCOLAX) packet Take 17 g by mouth 2 (two) times daily. 05/06/15  Yes Modena Jansky, MD  senna-docusate (SENOKOT-S) 8.6-50 MG per tablet Take 2 tablets by mouth 2 (two) times daily. 05/06/15  Yes Modena Jansky, MD  simvastatin (ZOCOR) 20 MG tablet Take 20 mg by mouth at bedtime.    Yes Historical Provider, MD  Vitamin D, Ergocalciferol, (DRISDOL) 50000 UNITS  CAPS capsule Take 50,000 Units by mouth every Monday.    Yes Historical Provider, MD  feeding supplement, RESOURCE BREEZE, (RESOURCE BREEZE) LIQD Take 1 Container by mouth 3 (three) times daily between meals. Patient not taking: Reported on 05/20/2015 05/06/15   Modena Jansky, MD  folic acid (FOLVITE) 1 MG tablet Take 1 tablet (1 mg total) by mouth daily. Patient not taking: Reported on 06/02/2015 03/23/15   Truitt Merle, MD  nicotine (NICODERM CQ - DOSED IN MG/24 HOURS) 21 mg/24hr patch Place 1 patch (21 mg total) onto the skin daily. Patient not taking: Reported on 05/20/2015 05/06/15   Modena Jansky, MD   Allergies  Allergen Reactions  . Haldol [Haloperidol Decanoate] Other (See Comments)    tongue swelling  . Acetaminophen Other (See Comments)    Stomach pain  . Ibuprofen Other (See Comments)    Stomach pain  . Metronidazole Other (See Comments)    Severe stomach upset--was instructed to avoid Flagyl.  . Morphine And Related Hives    Tolerates hydromorphone.  Marland Kitchen Penicillins Rash   CBC:    Component Value Date/Time   WBC 10.5 06/04/2015 0500   WBC 4.9 05/20/2015 1523   HGB 11.6* 06/04/2015 0500   HGB 7.1* 05/20/2015 1523   HCT 34.3* 06/04/2015 0500   HCT 22.7* 05/20/2015 1523   PLT 13* 06/04/2015 0500   PLT 26* 05/20/2015 1523   MCV 87.9 06/04/2015 0500   MCV 91.9 05/20/2015 1523   NEUTROABS 7.0 06/02/2015 1524   NEUTROABS 3.4 05/20/2015 1523   LYMPHSABS 1.6 06/02/2015 1524   LYMPHSABS 1.0 05/20/2015 1523   MONOABS 0.9 06/02/2015 1524   MONOABS 0.5 05/20/2015 1523   EOSABS 0.0 06/02/2015 1524   EOSABS 0.0 05/20/2015  1523   BASOSABS 0.0 06/02/2015 1524   BASOSABS 0.0 05/20/2015 1523   Comprehensive Metabolic Panel:    Component Value Date/Time   NA 137 06/04/2015 0500   NA 138 05/20/2015 1524   K 3.8 06/04/2015 0500   K 4.1 05/20/2015 1524   CL 100* 06/04/2015 0500   CO2 28 06/04/2015 0500   CO2 28 05/20/2015 1524   BUN 16 06/04/2015 0500   BUN 25.6 05/20/2015 1524   CREATININE 0.60 06/04/2015 0500   CREATININE 1.0 05/20/2015 1524   GLUCOSE 98 06/04/2015 0500   GLUCOSE 105 05/20/2015 1524   CALCIUM 8.9 06/04/2015 0500   CALCIUM 9.1 05/20/2015 1524   AST 33 06/03/2015 0500   AST 16 05/20/2015 1524   ALT 19 06/03/2015 0500   ALT 9 05/20/2015 1524   ALKPHOS 412* 06/03/2015 0500   ALKPHOS 531* 05/20/2015 1524   BILITOT 0.9 06/03/2015 0500   BILITOT 0.36 05/20/2015 1524   PROT 6.3* 06/03/2015 0500   PROT 5.8* 05/20/2015 1524   ALBUMIN 3.5 06/03/2015 0500   ALBUMIN 2.9* 05/20/2015 1524    Physical Exam: Vital Signs: BP 129/86 mmHg  Pulse 119  Temp(Src) 99.1 F (37.3 C) (Oral)  Resp 22  Ht '5\' 4"'$  (1.626 m)  Wt 51.937 kg (114 lb 8 oz)  BMI 19.64 kg/m2  SpO2 98% SpO2: SpO2: 98 % O2 Device: O2 Device: Not Delivered O2 Flow Rate:   Intake/output summary:  Intake/Output Summary (Last 24 hours) at 06/04/15 1557 Last data filed at 06/04/15 1316  Gross per 24 hour  Intake    600 ml  Output   1150 ml  Net   -550 ml   LBM: Last BM Date: 06/02/15 Baseline Weight: Weight: 51.937 kg (114 lb  8 oz) Most recent weight: Weight: 51.937 kg (114 lb 8 oz)  Exam Findings:  Weak frail, pale-appearing lady resting in bed Lungs are shallow. Poor inspiratory effort S1-S2 Abdomen soft No edema Nonfocal Psychiatric: Mood and affect: Depressed and flat.                       Palliative Performance Scale: 20-30 percent. [name] declining in terms of oral intake, mobility Additional Data Reviewed: Recent Labs     06/03/15  0500  06/04/15  0500  WBC  9.0  10.5  HGB  9.0*  11.6*  PLT   16*  13*  NA  136  137  BUN  24*  16  CREATININE  0.66  0.60     Time In: 0900 Time Out: 0955 Time Total: 55 minutes Greater than 50%  of this time was spent counseling and coordinating care related to the above assessment and plan.  Signed by: Loistine Chance, MD [phone] Loistine Chance, MD  06/04/2015, 3:57 PM  Please contact Palliative Medicine Team phone at [phone] for questions and concerns.

## 2015-06-05 DIAGNOSIS — D649 Anemia, unspecified: Secondary | ICD-10-CM | POA: Insufficient documentation

## 2015-06-05 LAB — URINE CULTURE

## 2015-06-05 LAB — GLUCOSE, CAPILLARY
GLUCOSE-CAPILLARY: 160 mg/dL — AB (ref 65–99)
GLUCOSE-CAPILLARY: 107 mg/dL — AB (ref 65–99)
Glucose-Capillary: 161 mg/dL — ABNORMAL HIGH (ref 65–99)
Glucose-Capillary: 124 mg/dL — ABNORMAL HIGH (ref 65–99)

## 2015-06-05 LAB — CBC
HCT: 33 % — ABNORMAL LOW (ref 36.0–46.0)
HEMOGLOBIN: 10.7 g/dL — AB (ref 12.0–15.0)
MCH: 29.4 pg (ref 26.0–34.0)
MCHC: 32.4 g/dL (ref 30.0–36.0)
MCV: 90.7 fL (ref 78.0–100.0)
PLATELETS: 8 10*3/uL — AB (ref 150–400)
RBC: 3.64 MIL/uL — ABNORMAL LOW (ref 3.87–5.11)
RDW: 19.2 % — AB (ref 11.5–15.5)
WBC: 8.2 10*3/uL (ref 4.0–10.5)

## 2015-06-05 MED ORDER — OXYCODONE HCL ER 40 MG PO T12A
50.0000 mg | EXTENDED_RELEASE_TABLET | Freq: Two times a day (BID) | ORAL | Status: DC
  Administered 2015-06-05 – 2015-06-08 (×6): 50 mg via ORAL
  Filled 2015-06-05 (×12): qty 1

## 2015-06-05 NOTE — Progress Notes (Signed)
Progress Note   [name] KGU:[id number] DOB: Oct 23, 1951 DOA: 06/02/2015 PCP: Harvie Junior, MD   Brief Narrative:   [name] [name] is an 64 y.o. 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 Upon initial evaluation, hemoglobin was 5.5, platelets 14, potassium 5.4 and creatinine 1.05.  Assessment/Plan:   Principal Problem:   Symptomatic anemia / anemia associated with malignancy - Status post 4 U PRBC transfusion and with appropriate increase in post transfusion Hg.  Active Problems:   Asymptomatic bactiuria / Enterococcal colonization  - Denies dysuria, frequency or incomplete emptying of bladder. - Hold off on anti-biotics for now.    Severe thrombocytopenia associated with malignancy - Transfuse as needed for platelets less than 10,000 or active bleeding.    Hepatitis C - Transaminase levels WNL. Alkaline phosphatase slightly elevated, likely from bony metastasis.    Hypertension - Continue home medical regimen with Irbesartan and Metoprolol.     Diabetes mellitus - Metformin on hold. Currently being managed with SSI, insulin sensitive scale Q AC/HS. CBG 105-161.    Metastatic primary lung cancer, stage IV - Oncology notified, however has not been a candidate for treatment in the past.    Uncontrolled pain - Continue pain control efforts. Increase Oxycontin to 50 mg BID.    Constipation due to opioid therapy - Continue bowel regimen.    DVT Prophylaxis - SCDs ordered.  Family Communication: No family at bedside.  Declines my offer to call, saying she will visit today. Disposition Plan: Home with daughter and hospice services when pain adequately controlled. Code Status: DNR    IV Access:    Peripheral  IV   Procedures and diagnostic studies:   Dg Chest 2 View  06/02/2015   CLINICAL DATA:  Fever.  Metastatic lung carcinoma  EXAM: CHEST  2 VIEW  COMPARISON:  Chest radiograph April 29, 2015; PET-CT Apr 01, 2015  FINDINGS: There is postoperative change on the right. The interstitium is mildly prominent but stable. There is no frank edema or consolidation. The heart is borderline enlarged with pulmonary vascular within normal limits. No adenopathy is appreciable. Port-A-Cath tip is in the superior vena cava. No pneumothorax. There is widespread bony metastatic disease, better appreciated on the PET study. There is postoperative change in the cervical spine. There is mid thoracic dextroscoliosis.  IMPRESSION: No edema or consolidation. No mass appreciable. Diffuse interstitial prominence. Question lymphangitic spread of tumor versus interstitial prominence due to allergic type phenomenon from chemotherapy. Widespread bony metastatic disease, better appreciated on the recent PET study. No adenopathy apparent.   Electronically Signed   By: Lowella Grip III M.D.   On: 06/02/2015 14:47   Ct Head Wo Contrast  06/02/2015   CLINICAL DATA:  Metastatic lung cancer.  Altered mental status.  EXAM: CT HEAD WITHOUT CONTRAST  TECHNIQUE: Contiguous axial images were obtained from the base of the skull through the vertex without intravenous contrast.  COMPARISON:  MR brain 02/26/2015  FINDINGS: There is no evidence of mass effect, midline shift or extra-axial fluid collections. There is no evidence of a space-occupying lesion or intracranial hemorrhage. There is no evidence of a cortical-based area of acute infarction. There is left frontal lobe encephalomalacia from prior insult.  The ventricles and sulci are appropriate for the  patient's age. The basal cisterns are patent.  Visualized portions of the orbits are unremarkable. The visualized portions of the paranasal sinuses and mastoid air cells are unremarkable.  There is  diffuse calvarial metastatic disease. There is evidence of prior left frontal craniotomy.  IMPRESSION: 1. No acute intracranial pathology. 2. Evaluation for metastatic disease is limited secondary to lack of intravenous contrast. 3. Stable left frontal lobe encephalomalacia. 4. Osseous metastatic disease throughout the calvarium.   Electronically Signed   By: Kathreen Devoid   On: 06/02/2015 15:47     Medical Consultants:    Palliative Care  Anti-Infectives:    None.  Subjective:   Kyrielle Urbanski feels the pain medication has improved her pain, described as aching, still rates it as a 10/10. Appetite remains poor. Some nausea, no vomiting.    Objective:    Filed Vitals:   06/04/15 0642 06/04/15 1315 06/04/15 2331 06/05/15 0515  BP: 142/90 129/86 131/75 128/82  Pulse: 100 119 110 107  Temp: 98.9 F (37.2 C) 99.1 F (37.3 C) 98 F (36.7 C) 99.2 F (37.3 C)  TempSrc: Oral Oral Oral Oral  Resp: '20 22 20 18  '$ Height:      Weight:      SpO2:  98% 97% 98%    Intake/Output Summary (Last 24 hours) at 06/05/15 0740 Last data filed at 06/05/15 0516  Gross per 24 hour  Intake    710 ml  Output    250 ml  Net    460 ml    Exam: Gen:  NAD Cardiovascular:  Tachy, No M/R/G, hyperdynamic prechordium Respiratory:  Lungs CTAB Gastrointestinal:  Abdomen soft, NT/ND, + BS Extremities:  No C/E/C   Data Reviewed:    Labs: Basic Metabolic Panel:  Recent Labs Lab 06/02/15 1524 06/03/15 0500 06/04/15 0500  NA 136 136 137  K 5.4* 4.5 3.8  CL 104 103 100*  CO2 '24 27 28  '$ GLUCOSE 120* 86 98  BUN 43* 24* 16  CREATININE 1.05* 0.66 0.60  CALCIUM 8.4* 8.7* 8.9   GFR Estimated Creatinine Clearance: 56.7 mL/min (by C-G formula based on Cr of 0.6). Liver Function Tests:  Recent Labs Lab 06/02/15 1524 06/03/15 0500  AST 27 33  ALT 15 19  ALKPHOS 427* 412*  BILITOT 0.4 0.9  PROT 6.2* 6.3*  ALBUMIN 3.4* 3.5   CBC:  Recent Labs Lab 06/02/15 1524 06/02/15 1614  06/03/15 0500 06/04/15 0500 06/05/15 0500  WBC 9.5  --  9.0 10.5 8.2  NEUTROABS 7.0  --   --   --   --   HGB 5.5* 5.7* 9.0* 11.6* 10.7*  HCT 17.6* 18.6* 27.7* 34.3* 33.0*  MCV 93.6  --  89.1 87.9 90.7  PLT 14*  --  16* 13* 8*   CBG:  Recent Labs Lab 06/03/15 2107 06/04/15 0800 06/04/15 1145 06/04/15 1658 06/04/15 2149  GLUCAP 119* 105* 142* 143* 161*   Anemia work up:  Recent Labs  06/02/15 1614  VITAMINB12 1141*  FOLATE 12.4  FERRITIN 761*  TIBC 346  IRON 222*  RETICCTPCT 3.3*   Sepsis Labs:  Recent Labs Lab 06/02/15 1521 06/02/15 1524 06/02/15 1810 06/03/15 0500 06/04/15 0500 06/05/15 0500  WBC  --  9.5  --  9.0 10.5 8.2  LATICACIDVEN 3.51*  --  2.32*  --   --   --    Microbiology Recent Results (from the past 240 hour(s))  Culture, blood (routine x 2)     Status: None (Preliminary  result)   Collection Time: 06/02/15  2:55 PM  Result Value Ref Range Status   Specimen Description BLOOD RIGHT HAND  Final   Special Requests BOTTLES DRAWN AEROBIC ONLY [place]  Final   Culture   Final    NO GROWTH 2 DAYS Performed at [facility]    Report Status PENDING  Incomplete  Culture, blood (routine x 2)     Status: None (Preliminary result)   Collection Time: 06/02/15  3:00 PM  Result Value Ref Range Status   Specimen Description BLOOD PORTA CATH  Final   Special Requests BOTTLES DRAWN AEROBIC AND ANAEROBIC 10CC  Final   Culture   Final    NO GROWTH 2 DAYS Performed at [facility]    Report Status PENDING  Incomplete  Urine culture     Status: None   Collection Time: 06/02/15  4:03 PM  Result Value Ref Range Status   Specimen Description URINE, RANDOM  Final   Special Requests NONE  Final   Culture   Final    >=100,000 COLONIES/mL ENTEROCOCCUS SPECIES Performed at [facility]    Report Status 06/05/2015 FINAL  Final   Organism ID, Bacteria ENTEROCOCCUS SPECIES  Final      Susceptibility   Enterococcus species - MIC*     AMPICILLIN <=2 SENSITIVE Sensitive     LEVOFLOXACIN 1 SENSITIVE Sensitive     NITROFURANTOIN <=16 SENSITIVE Sensitive     VANCOMYCIN 2 SENSITIVE Sensitive     LINEZOLID 1 SENSITIVE Sensitive     * >=100,000 COLONIES/mL ENTEROCOCCUS SPECIES     Medications:   . ALPRAZolam  0.5 mg Oral Daily  . dicyclomine  20 mg Oral TID WC  . DULoxetine  30 mg Oral Daily  . feeding supplement  1 Container Oral TID BM  . gabapentin  400 mg Oral TID  . insulin aspart  0-5 Units Subcutaneous QHS  . insulin aspart  0-9 Units Subcutaneous TID WC  . irbesartan  150 mg Oral Daily  . loratadine  10 mg Oral q morning - 10a  . metoCLOPramide  5 mg Oral TID AC  . metoprolol tartrate  25 mg Oral Daily  . OXcarbazepine  150 mg Oral TID  . OxyCODONE  40 mg Oral Q12H  . pantoprazole  40 mg Oral Daily  . polyethylene glycol  17 g Oral BID  . senna-docusate  2 tablet Oral BID  . [START ON 06/07/2015] Vitamin D (Ergocalciferol)  50,000 Units Oral Q Mon   Continuous Infusions:   Time spent: 25 minutes.    LOS: 1 day   RAMA,CHRISTINA  [facility] Pager [phone]. If unable to reach me by pager, please call my cell phone at [phone].  *Please refer to [email or web address], password [email or web address] to get updated schedule on who will round on this patient, as hospitalists switch teams weekly. If 7PM-7AM, please contact night-coverage at [email or web address], password [email or web address] for any overnight needs.  06/05/2015, 7:40 AM

## 2015-06-05 NOTE — Progress Notes (Signed)
Daily Progress Note   Patient Name: [name] [name]       Date: 06/05/2015 DOB: 21-Aug-1951  Age: 64 y.o. MRN#: [id number] Attending Physician: Venetia Maxon Rama, MD Primary Care Physician: Harvie Junior, MD Admit Date: 06/02/2015 Life limiting illness stage IV right upper lobe lung cancer with widespread metastases, spinal metastases Reason for Consultation/Follow-up: Establishing goals of care  Subjective: Resting in bed, continues to complain of pain, generalized.  Interval Events: Patient has used a total of 7 mg of IV Dilaudid over the course of the past 24 hours. She remains on scheduled OxyContin 40 mg twice a day as well as when necessary oxycodone 10 mg every 2-3 hours when necessary. May have to consider Dilaudid PCA if IV needs escalate.  CODE STATUS was revised to DO NOT RESUSCITATE after palliative discussions with the patient as well as his questions with the patient's daughter over the phone.  Will recommend addition of hospice services. Will recommend patient going home with hospice towards the end of this hospitalization. Length of Stay: 1 day  Current Medications: Scheduled Meds:  . ALPRAZolam  0.5 mg Oral Daily  . dicyclomine  20 mg Oral TID WC  . DULoxetine  30 mg Oral Daily  . feeding supplement  1 Container Oral TID BM  . gabapentin  400 mg Oral TID  . insulin aspart  0-5 Units Subcutaneous QHS  . insulin aspart  0-9 Units Subcutaneous TID WC  . irbesartan  150 mg Oral Daily  . loratadine  10 mg Oral q morning - 10a  . metoCLOPramide  5 mg Oral TID AC  . metoprolol tartrate  25 mg Oral Daily  . OXcarbazepine  150 mg Oral TID  . OxyCODONE  40 mg Oral Q12H  . pantoprazole  40 mg Oral Daily  . polyethylene glycol  17 g Oral BID  . senna-docusate  2 tablet Oral BID  . [START ON 06/07/2015] Vitamin D (Ergocalciferol)  50,000 Units Oral Q Mon    Continuous Infusions:    PRN Meds: HYDROmorphone (DILAUDID) injection, ipratropium-albuterol, magnesium  hydroxide, ondansetron, oxyCODONE  Palliative Performance Scale: 20%     Vital Signs: BP 128/82 mmHg  Pulse 107  Temp(Src) 99.2 F (37.3 C) (Oral)  Resp 18  Ht '5\' 4"'$  (1.626 m)  Wt 51.937 kg (114 lb 8 oz)  BMI 19.64 kg/m2  SpO2 98% SpO2: SpO2: 98 % O2 Device: O2 Device: Not Delivered O2 Flow Rate:    Intake/output summary:  Intake/Output Summary (Last 24 hours) at 06/05/15 0932 Last data filed at 06/05/15 0516  Gross per 24 hour  Intake    710 ml  Output    250 ml  Net    460 ml   LBM:   Baseline Weight: Weight: 51.937 kg (114 lb 8 oz) Most recent weight: Weight: 51.937 kg (114 lb 8 oz)  Physical Exam:  Weak frail, pale-appearing lady resting in bed Lungs are shallow. Poor inspiratory effort S1-S2 Abdomen soft No edema Nonfocal Psychiatric: Mood and affect: Depressed and flat.  Additional Data Reviewed: Recent Labs     06/03/15  0500  06/04/15  0500  06/05/15  0500  WBC  9.0  10.5  8.2  HGB  9.0*  11.6*  10.7*  PLT  16*  13*  8*  NA  136  137   --   BUN  24*  16   --   CREATININE  0.66  0.60   --      Problem  List:  Patient Active Problem List   Diagnosis Date Noted  . Symptomatic anemia 06/02/2015  . Encounter for palliative care   . Neuropathic pain   . Constipation due to opioid therapy   . Anxiety and depression   . Lactic acidosis   . SIRS (systemic inflammatory response syndrome) 04/30/2015  . Malnutrition of moderate degree 04/30/2015  . Dehydration   . Hypokalemia   . GERD (gastroesophageal reflux disease)   . Chronic pain   . AP (abdominal pain)   . Protein-calorie malnutrition, severe 04/04/2015  . Nausea and vomiting 04/02/2015  . Nausea & vomiting 04/02/2015  . Left lateral abdominal pain 04/02/2015  . DM gastroparesis 04/02/2015  . Generalized anxiety disorder 04/02/2015  . Anxiety state   . Palliative care encounter   . Depression   . CN (constipation)   . Metastatic cancer to bone   . Metastatic adenocarcinoma 03/08/2015   . Nausea with vomiting 03/08/2015  . Cancer associated pain 03/08/2015  . Osseous metastasis 02/17/2015  . Uncontrolled pain 02/17/2015  . Metastatic primary lung cancer 02/11/2015  . Radiculopathy 03/05/2014  . Intermittent diarrhea 02/07/2014  . Spinal stenosis of lumbar region L L%-S1 08/10/2013  . Other and unspecified noninfectious gastroenteritis and colitis(558.9) 05/27/2013  . Abdominal pain 05/26/2013  . Hepatitis C 05/26/2013  . Hepatitis B 05/26/2013  . Hypertension 05/26/2013  . Diabetes mellitus 05/26/2013  . Nausea vomiting and diarrhea 03/08/2013  . Abdominal pain, epigastric 03/08/2013  . Enteritis due to Clostridium difficile 01/02/2013  . Tachycardia 12/30/2012  . Bipolar 1 disorder 12/29/2012  . S/P thoracotomy 12/10/2012  . Bipolar disorder 12/08/2012  . Anxiety disorder 12/08/2012  . Lung cancer, right upper lobe 11/07/2012     Palliative Care Assessment & Plan    Code Status:  DNR  Goals of Care:  Good pain management. Continue to discuss comfort measures, hospice arrangements.  Desire for further Chaplaincy support:no  3. Symptom Management:  Remains on IV Dilaudid, OxyContin, when necessary oxycodone. Continue to monitor. Also on bowel regimen.  4. Palliative Prophylaxis:  Stool Softener: Yes  5. Prognosis: Few weeks-few months  5. Discharge Planning: Would recommend Home with Hospice   Care plan was discussed with patient  Thank you for allowing the Palliative Medicine Team to assist in the care of this patient.   Time In: 0815 Time Out: 0835 Total Time 35 Prolonged Time Billed  no     Greater than 50%  of this time was spent counseling and coordinating care related to the above assessment and plan.   Loistine Chance, MD  06/05/2015, 9:32 AM  [phone] Please contact Palliative Medicine Team phone at [phone] for questions and concerns.

## 2015-06-06 LAB — GLUCOSE, CAPILLARY
GLUCOSE-CAPILLARY: 174 mg/dL — AB (ref 65–99)
Glucose-Capillary: 109 mg/dL — ABNORMAL HIGH (ref 65–99)
Glucose-Capillary: 121 mg/dL — ABNORMAL HIGH (ref 65–99)
Glucose-Capillary: 134 mg/dL — ABNORMAL HIGH (ref 65–99)

## 2015-06-06 MED ORDER — SODIUM CHLORIDE 0.9 % IV SOLN
Freq: Once | INTRAVENOUS | Status: AC
  Administered 2015-06-06: 17:00:00 via INTRAVENOUS

## 2015-06-06 MED ORDER — OXYCODONE HCL ER 20 MG PO T12A: 60.0000 mg | EXTENDED_RELEASE_TABLET | Freq: Three times a day (TID) | ORAL | Status: DC

## 2015-06-06 MED ORDER — ENSURE ENLIVE PO LIQD: 237.0000 mL | Freq: Three times a day (TID) | ORAL | Status: DC

## 2015-06-06 NOTE — Progress Notes (Signed)
Progress Note   [name] [name] UGQ:[id number] DOB: July 17, 1951 DOA: 06/02/2015 PCP: Harvie Junior, MD   Brief Narrative:   [name] [name] is an 64 y.o. 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 Upon initial evaluation, hemoglobin was 5.5, platelets 14, potassium 5.4 and creatinine 1.05.  Assessment/Plan:   Principal Problem:   Symptomatic anemia / anemia associated with malignancy - Status post 4 U PRBC transfusion and with appropriate increase in post transfusion Hg.  Active Problems:   Asymptomatic bactiuria / Enterococcal colonization  - Denies dysuria, frequency or incomplete emptying of bladder. - Hold off on anti-biotics for now.    Severe thrombocytopenia associated with malignancy - Transfuse as needed for platelets less than 10,000 or active bleeding.    Hepatitis C - Transaminase levels WNL. Alkaline phosphatase slightly elevated, likely from bony metastasis.    Hypertension - Continue home medical regimen with Irbesartan and Metoprolol.     Diabetes mellitus - Metformin on hold. Currently being managed with SSI, insulin sensitive scale Q AC/HS. CBG 107-161.    Metastatic primary lung cancer, stage IV - Oncology notified, however has not been a candidate for treatment in the past.    Uncontrolled pain - Continue pain control efforts. Increase Oxycontin increased to 50 mg BID 06/05/15. - The patient is still requiring high doses of IV Dilaudid for pain control which is the current barrier to discharge.    Constipation due to opioid therapy - Continue bowel regimen.    DVT Prophylaxis - SCDs ordered.  Family Communication: No family at bedside.  Declines my offer to call. Disposition Plan: Home with daughter  and hospice services when pain adequately controlled, likely 06/07/15. Code Status: DNR    IV Access:    Peripheral IV   Procedures and diagnostic studies:   Dg Chest 2 View  06/02/2015   CLINICAL DATA:  Fever.  Metastatic lung carcinoma  EXAM: CHEST  2 VIEW  COMPARISON:  Chest radiograph April 29, 2015; PET-CT Apr 01, 2015  FINDINGS: There is postoperative change on the right. The interstitium is mildly prominent but stable. There is no frank edema or consolidation. The heart is borderline enlarged with pulmonary vascular within normal limits. No adenopathy is appreciable. Port-A-Cath tip is in the superior vena cava. No pneumothorax. There is widespread bony metastatic disease, better appreciated on the PET study. There is postoperative change in the cervical spine. There is mid thoracic dextroscoliosis.  IMPRESSION: No edema or consolidation. No mass appreciable. Diffuse interstitial prominence. Question lymphangitic spread of tumor versus interstitial prominence due to allergic type phenomenon from chemotherapy. Widespread bony metastatic disease, better appreciated on the recent PET study. No adenopathy apparent.   Electronically Signed   By: Lowella Grip III M.D.   On: 06/02/2015 14:47   Ct Head Wo Contrast  06/02/2015   CLINICAL DATA:  Metastatic lung cancer.  Altered mental status.  EXAM: CT HEAD WITHOUT CONTRAST  TECHNIQUE: Contiguous axial images were obtained from the base of the skull through the vertex without intravenous contrast.  COMPARISON:  MR brain 02/26/2015  FINDINGS: There is no evidence of mass effect, midline shift or extra-axial fluid collections. There is no evidence of a space-occupying lesion or intracranial hemorrhage. There is no evidence of a cortical-based area of  acute infarction. There is left frontal lobe encephalomalacia from prior insult.  The ventricles and sulci are appropriate for the patient's age. The basal cisterns are patent.  Visualized portions of the  orbits are unremarkable. The visualized portions of the paranasal sinuses and mastoid air cells are unremarkable.  There is diffuse calvarial metastatic disease. There is evidence of prior left frontal craniotomy.  IMPRESSION: 1. No acute intracranial pathology. 2. Evaluation for metastatic disease is limited secondary to lack of intravenous contrast. 3. Stable left frontal lobe encephalomalacia. 4. Osseous metastatic disease throughout the calvarium.   Electronically Signed   By: Kathreen Devoid   On: 06/02/2015 15:47     Medical Consultants:    Palliative Care  Anti-Infectives:    None.  Subjective:   [name] [name] seems to be in better spirits today. Pain seems to be better controlled, although she still reports left hip pain. No nausea or vomiting. Snacking frequently.  Does not feel constipated.  Objective:    Filed Vitals:   06/05/15 0515 06/05/15 1318 06/05/15 2019 06/06/15 0504  BP: 128/82 120/82 138/86 153/92  Pulse: 107 120 130 128  Temp: 99.2 F (37.3 C) 98.7 F (37.1 C) 99.5 F (37.5 C) 98.5 F (36.9 C)  TempSrc: Oral Oral Oral Oral  Resp: '18 16 16 16  '$ Height:      Weight:      SpO2: 98% 99% 100% 100%    Intake/Output Summary (Last 24 hours) at 06/06/15 0740 Last data filed at 06/05/15 1700  Gross per 24 hour  Intake    480 ml  Output    200 ml  Net    280 ml    Exam: Gen:  NAD Cardiovascular:  Tachy, No M/R/G, hyperdynamic prechordium Respiratory:  Lungs CTAB Gastrointestinal:  Abdomen soft, NT/ND, + BS Extremities:  No C/E/C   Data Reviewed:    Labs: Basic Metabolic Panel:  Recent Labs Lab 06/02/15 1524 06/03/15 0500 06/04/15 0500  NA 136 136 137  K 5.4* 4.5 3.8  CL 104 103 100*  CO2 '24 27 28  '$ GLUCOSE 120* 86 98  BUN 43* 24* 16  CREATININE 1.05* 0.66 0.60  CALCIUM 8.4* 8.7* 8.9   GFR Estimated Creatinine Clearance: 56.7 mL/min (by C-G formula based on Cr of 0.6). Liver Function Tests:  Recent Labs Lab 06/02/15 1524  06/03/15 0500  AST 27 33  ALT 15 19  ALKPHOS 427* 412*  BILITOT 0.4 0.9  PROT 6.2* 6.3*  ALBUMIN 3.4* 3.5   CBC:  Recent Labs Lab 06/02/15 1524 06/02/15 1614 06/03/15 0500 06/04/15 0500 06/05/15 0500  WBC 9.5  --  9.0 10.5 8.2  NEUTROABS 7.0  --   --   --   --   HGB 5.5* 5.7* 9.0* 11.6* 10.7*  HCT 17.6* 18.6* 27.7* 34.3* 33.0*  MCV 93.6  --  89.1 87.9 90.7  PLT 14*  --  16* 13* 8*   CBG:  Recent Labs Lab 06/05/15 0756 06/05/15 1140 06/05/15 1656 06/05/15 2133 06/06/15 0728  GLUCAP 160* 161* 124* 107* 109*   Sepsis Labs:  Recent Labs Lab 06/02/15 1521 06/02/15 1524 06/02/15 1810 06/03/15 0500 06/04/15 0500 06/05/15 0500  WBC  --  9.5  --  9.0 10.5 8.2  LATICACIDVEN 3.51*  --  2.32*  --   --   --    Microbiology Recent Results (from the past 240 hour(s))  Culture, blood (routine x 2)     Status: None (Preliminary result)   Collection  Time: 06/02/15  2:55 PM  Result Value Ref Range Status   Specimen Description BLOOD RIGHT HAND  Final   Special Requests BOTTLES DRAWN AEROBIC ONLY [place]  Final   Culture   Final    NO GROWTH 3 DAYS Performed at [facility]    Report Status PENDING  Incomplete  Culture, blood (routine x 2)     Status: None (Preliminary result)   Collection Time: 06/02/15  3:00 PM  Result Value Ref Range Status   Specimen Description BLOOD PORTA CATH  Final   Special Requests BOTTLES DRAWN AEROBIC AND ANAEROBIC 10CC  Final   Culture   Final    NO GROWTH 3 DAYS Performed at [facility]    Report Status PENDING  Incomplete  Urine culture     Status: None   Collection Time: 06/02/15  4:03 PM  Result Value Ref Range Status   Specimen Description URINE, RANDOM  Final   Special Requests NONE  Final   Culture   Final    >=100,000 COLONIES/mL ENTEROCOCCUS SPECIES Performed at [facility]    Report Status 06/05/2015 FINAL  Final   Organism ID, Bacteria ENTEROCOCCUS SPECIES  Final      Susceptibility    Enterococcus species - MIC*    AMPICILLIN <=2 SENSITIVE Sensitive     LEVOFLOXACIN 1 SENSITIVE Sensitive     NITROFURANTOIN <=16 SENSITIVE Sensitive     VANCOMYCIN 2 SENSITIVE Sensitive     LINEZOLID 1 SENSITIVE Sensitive     * >=100,000 COLONIES/mL ENTEROCOCCUS SPECIES     Medications:   . ALPRAZolam  0.5 mg Oral Daily  . dicyclomine  20 mg Oral TID WC  . DULoxetine  30 mg Oral Daily  . feeding supplement  1 Container Oral TID BM  . gabapentin  400 mg Oral TID  . insulin aspart  0-5 Units Subcutaneous QHS  . insulin aspart  0-9 Units Subcutaneous TID WC  . irbesartan  150 mg Oral Daily  . loratadine  10 mg Oral q morning - 10a  . metoCLOPramide  5 mg Oral TID AC  . metoprolol tartrate  25 mg Oral Daily  . OXcarbazepine  150 mg Oral TID  . OxyCODONE  50 mg Oral Q12H  . pantoprazole  40 mg Oral Daily  . polyethylene glycol  17 g Oral BID  . senna-docusate  2 tablet Oral BID  . [START ON 06/07/2015] Vitamin D (Ergocalciferol)  50,000 Units Oral Q Mon   Continuous Infusions:   Time spent: 25 minutes.    LOS: 2 days   Jerrald Doverspike  [facility] Pager [phone]. If unable to reach me by pager, please call my cell phone at [phone].  *Please refer to [email or web address], password [email or web address] to get updated schedule on who will round on this patient, as hospitalists switch teams weekly. If 7PM-7AM, please contact night-coverage at [email or web address], password [email or web address] for any overnight needs.  06/06/2015, 7:40 AM

## 2015-06-06 NOTE — Progress Notes (Signed)
PT Cancellation Note  Patient Details Name: [name] [name] MRN: [id number] DOB: July 16, 1951   Cancelled Treatment:    Reason Eval/Treat Not Completed: Pain limiting ability to participate. Attempted PT eval-pt declined to participate due to pain. Will check back another time as schedule permits. Thanks.    Weston Anna, MPT Pager: [phone]

## 2015-06-06 NOTE — Discharge Summary (Addendum)
Physician Discharge Summary  Christal Lagerstrom IOE:[id number] DOB: 1951/04/21 DOA: 06/02/2015  PCP: Harvie Junior, MD  Admit date: 06/02/2015 Discharge date: 06/07/2015   Recommendations for Outpatient Follow-Up:   1. The patient is being discharged home with hospice services. 2. The patient has a Port-A-Cath if needed for IV pain management.   Discharge Diagnosis:   Principal Problem:    Symptomatic anemia Active Problems:    Hepatitis C    Hypertension    Diabetes mellitus    Metastatic primary lung cancer    Uncontrolled pain    Constipation due to opioid therapy    Absolute anemia   Discharge disposition:  Home with hospice services.  Discharge Condition: Stable.  Diet recommendation: Regular.   History of Present Illness:   [name] [name] is an 64 y.o. 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 Upon initial evaluation, hemoglobin was 5.5, platelets 14, potassium 5.4 and creatinine 1.05.   Hospital Course by Problem:   Principal Problem:  Symptomatic anemia / anemia associated with malignancy - Status post 4 U PRBC transfusion and with appropriate increase in post transfusion Hg.  Active Problems:  Asymptomatic bactiuria / Enterococcal colonization  - Denies dysuria, frequency or incomplete emptying of bladder. - Because she was asymptomatic, antibiotics were not given.   Severe thrombocytopenia associated with malignancy - Transfuse as needed for platelets less than 10,000 or active bleeding. - Received a 6 pack of platelets on 06/06/15 for platelet count of 8000.   Hepatitis C - Transaminase levels WNL. Alkaline phosphatase slightly elevated, likely from bony metastasis.   Hypertension -  Continue home medical regimen with Irbesartan and Metoprolol.   Diabetes mellitus - Metformin on hold. Currently being managed with SSI, insulin sensitive scale Q AC/HS. CBG 107-161. - Resume metformin at discharge.   Metastatic primary lung cancer, stage IV - Oncology notified, however has not been a candidate for treatment in the past. - We'll be discharged home with hospice services.   Uncontrolled pain - Continue pain control efforts. Increase Oxycontin increased to 50 mg BID 06/05/15. - Would discharge home on 60 mg of OxyContin twice a day.  - The patient does have a Port-A-Cath in place for IV pain management if needed.   Constipation due to opioid therapy - Continue bowel regimen.  Patient's discharge was held yesterday as no equipment was delivered to home, now the equipment has been delivered, will discharge her home.  Medical Consultants:    Palliative Care   Discharge Exam:   Filed Vitals:   06/06/15 0504  BP: 153/92  Pulse: 128  Temp: 98.5 F (36.9 C)  Resp: 16   Filed Vitals:   06/05/15 0515 06/05/15 1318 06/05/15 2019 06/06/15 0504  BP: 128/82 120/82 138/86 153/92  Pulse: 107 120 130 128  Temp: 99.2 F (37.3 C) 98.7 F (37.1 C) 99.5 F (37.5 C) 98.5 F (36.9 C)  TempSrc: Oral Oral Oral Oral  Resp: '18 16 16 16  '$ Height:      Weight:      SpO2: 98% 99% 100% 100%    Gen:  NAD Cardiovascular:  Tachycardic, No M/R/G Respiratory: Lungs CTAB Gastrointestinal: Abdomen soft, NT/ND with normal active bowel sounds. Extremities: No C/E/C   The results of significant diagnostics from this hospitalization (  including imaging, microbiology, ancillary and laboratory) are listed below for reference.     Procedures and Diagnostic Studies:   Dg Chest 2 View  06/02/2015   CLINICAL DATA:  Fever.  Metastatic lung carcinoma  EXAM: CHEST  2 VIEW  COMPARISON:  Chest radiograph April 29, 2015; PET-CT Apr 01, 2015  FINDINGS: There is postoperative change on the  right. The interstitium is mildly prominent but stable. There is no frank edema or consolidation. The heart is borderline enlarged with pulmonary vascular within normal limits. No adenopathy is appreciable. Port-A-Cath tip is in the superior vena cava. No pneumothorax. There is widespread bony metastatic disease, better appreciated on the PET study. There is postoperative change in the cervical spine. There is mid thoracic dextroscoliosis.  IMPRESSION: No edema or consolidation. No mass appreciable. Diffuse interstitial prominence. Question lymphangitic spread of tumor versus interstitial prominence due to allergic type phenomenon from chemotherapy. Widespread bony metastatic disease, better appreciated on the recent PET study. No adenopathy apparent.   Electronically Signed   By: Lowella Grip III M.D.   On: 06/02/2015 14:47   Ct Head Wo Contrast  06/02/2015   CLINICAL DATA:  Metastatic lung cancer.  Altered mental status.  EXAM: CT HEAD WITHOUT CONTRAST  TECHNIQUE: Contiguous axial images were obtained from the base of the skull through the vertex without intravenous contrast.  COMPARISON:  MR brain 02/26/2015  FINDINGS: There is no evidence of mass effect, midline shift or extra-axial fluid collections. There is no evidence of a space-occupying lesion or intracranial hemorrhage. There is no evidence of a cortical-based area of acute infarction. There is left frontal lobe encephalomalacia from prior insult.  The ventricles and sulci are appropriate for the patient's age. The basal cisterns are patent.  Visualized portions of the orbits are unremarkable. The visualized portions of the paranasal sinuses and mastoid air cells are unremarkable.  There is diffuse calvarial metastatic disease. There is evidence of prior left frontal craniotomy.  IMPRESSION: 1. No acute intracranial pathology. 2. Evaluation for metastatic disease is limited secondary to lack of intravenous contrast. 3. Stable left frontal lobe  encephalomalacia. 4. Osseous metastatic disease throughout the calvarium.   Electronically Signed   By: Kathreen Devoid   On: 06/02/2015 15:47     Labs:   Basic Metabolic Panel:  Recent Labs Lab 06/02/15 1524 06/03/15 0500 06/04/15 0500  NA 136 136 137  K 5.4* 4.5 3.8  CL 104 103 100*  CO2 '24 27 28  '$ GLUCOSE 120* 86 98  BUN 43* 24* 16  CREATININE 1.05* 0.66 0.60  CALCIUM 8.4* 8.7* 8.9   GFR Estimated Creatinine Clearance: 56.7 mL/min (by C-G formula based on Cr of 0.6). Liver Function Tests:  Recent Labs Lab 06/02/15 1524 06/03/15 0500  AST 27 33  ALT 15 19  ALKPHOS 427* 412*  BILITOT 0.4 0.9  PROT 6.2* 6.3*  ALBUMIN 3.4* 3.5   CBC:  Recent Labs Lab 06/02/15 1524 06/02/15 1614 06/03/15 0500 06/04/15 0500 06/05/15 0500  WBC 9.5  --  9.0 10.5 8.2  NEUTROABS 7.0  --   --   --   --   HGB 5.5* 5.7* 9.0* 11.6* 10.7*  HCT 17.6* 18.6* 27.7* 34.3* 33.0*  MCV 93.6  --  89.1 87.9 90.7  PLT 14*  --  16* 13* 8*   CBG:  Recent Labs Lab 06/05/15 1140 06/05/15 1656 06/05/15 2133 06/06/15 0728 06/06/15 1200  GLUCAP 161* 124* 107* 109* 174*   Microbiology Recent Results (from the  past 240 hour(s))  Culture, blood (routine x 2)     Status: None (Preliminary result)   Collection Time: 06/02/15  2:55 PM  Result Value Ref Range Status   Specimen Description BLOOD RIGHT HAND  Final   Special Requests BOTTLES DRAWN AEROBIC ONLY 2CC  Final   Culture   Final    NO GROWTH 4 DAYS Performed at [facility]    Report Status PENDING  Incomplete  Culture, blood (routine x 2)     Status: None (Preliminary result)   Collection Time: 06/02/15  3:00 PM  Result Value Ref Range Status   Specimen Description BLOOD PORTA CATH  Final   Special Requests BOTTLES DRAWN AEROBIC AND ANAEROBIC 10CC  Final   Culture   Final    NO GROWTH 4 DAYS Performed at [facility]    Report Status PENDING  Incomplete  Urine culture     Status: None   Collection Time: 06/02/15   4:03 PM  Result Value Ref Range Status   Specimen Description URINE, RANDOM  Final   Special Requests NONE  Final   Culture   Final    >=100,000 COLONIES/mL ENTEROCOCCUS SPECIES Performed at [facility]    Report Status 06/05/2015 FINAL  Final   Organism ID, Bacteria ENTEROCOCCUS SPECIES  Final      Susceptibility   Enterococcus species - MIC*    AMPICILLIN <=2 SENSITIVE Sensitive     LEVOFLOXACIN 1 SENSITIVE Sensitive     NITROFURANTOIN <=16 SENSITIVE Sensitive     VANCOMYCIN 2 SENSITIVE Sensitive     LINEZOLID 1 SENSITIVE Sensitive     * >=100,000 COLONIES/mL ENTEROCOCCUS SPECIES     Discharge Instructions:   Discharge Instructions    Call MD for:  persistant nausea and vomiting    Complete by:  As directed      Call MD for:  severe uncontrolled pain    Complete by:  As directed      Diet general    Complete by:  As directed      Increase activity slowly    Complete by:  As directed             Medication List    STOP taking these medications        dexamethasone 4 MG tablet  Commonly known as:  DECADRON     folic acid 1 MG tablet  Commonly known as:  FOLVITE     KLOR-CON M20 20 MEQ tablet  Generic drug:  potassium chloride SA     nicotine 21 mg/24hr patch  Commonly known as:  NICODERM CQ - dosed in mg/24 hours     VITAMIN B-12 IJ      TAKE these medications        ALPRAZolam 1 MG tablet  Commonly known as:  XANAX  Take 0.5 tablets (0.5 mg total) by mouth daily.     COMBIVENT RESPIMAT 20-100 MCG/ACT Aers respimat  Generic drug:  Ipratropium-Albuterol  Inhale 2 puffs into the lungs daily as needed for wheezing or shortness of breath.     dicyclomine 20 MG tablet  Commonly known as:  BENTYL  Take 20 mg by mouth 3 (three) times daily with meals.     DULoxetine 30 MG capsule  Commonly known as:  CYMBALTA  Take 1 capsule (30 mg total) by mouth daily.     feeding supplement Liqd  Take 1 Container by mouth 3 (three) times daily between  meals.  feeding supplement (ENSURE ENLIVE) Liqd  Take 237 mLs by mouth 3 (three) times daily between meals.     gabapentin 400 MG capsule  Commonly known as:  NEURONTIN  Take 400 mg by mouth 3 (three) times daily. Nerve pain     loratadine 10 MG tablet  Commonly known as:  CLARITIN  Take 10 mg by mouth every morning.     magnesium hydroxide 400 MG/5ML suspension  Commonly known as:  MILK OF MAGNESIA  Take 5 mLs by mouth daily as needed for mild constipation.     metFORMIN 500 MG tablet  Commonly known as:  GLUCOPHAGE  Take 1 tablet by mouth 2 (two) times daily.     metoCLOPramide 5 MG tablet  Commonly known as:  REGLAN  Take 1 tablet (5 mg total) by mouth 3 (three) times daily before meals.     metoprolol tartrate 25 MG tablet  Commonly known as:  LOPRESSOR  Take 25 mg by mouth daily.     MUSCLE RUB 10-15 % Crea  Apply 1 application topically daily as needed for muscle pain. Left shoulder pain     olmesartan 40 MG tablet  Commonly known as:  BENICAR  Take 1 tablet (40 mg total) by mouth daily.     omeprazole 20 MG capsule  Commonly known as:  PRILOSEC  Take 1 capsule (20 mg total) by mouth 2 (two) times daily before a meal.     ondansetron 8 MG disintegrating tablet  Commonly known as:  ZOFRAN-ODT  Take 1 tablet (8 mg total) by mouth every 6 (six) hours as needed for nausea or vomiting.     OXcarbazepine 150 MG tablet  Commonly known as:  TRILEPTAL  Take 150 mg by mouth 3 (three) times daily.     Oxycodone HCl 10 MG Tabs  Take 1-2 tablets (10-20 mg total) by mouth every 4 (four) hours as needed (moderate pain, severe pain, breakthrough pain).     OxyCODONE 20 mg T12a 12 hr tablet  Commonly known as:  OXYCONTIN  Take 3 tablets (60 mg total) by mouth 3 (three) times daily.     polyethylene glycol packet  Commonly known as:  MIRALAX / GLYCOLAX  Take 17 g by mouth 2 (two) times daily.     senna-docusate 8.6-50 MG per tablet  Commonly known as:  Senokot-S    Take 2 tablets by mouth 2 (two) times daily.     simvastatin 20 MG tablet  Commonly known as:  ZOCOR  Take 20 mg by mouth at bedtime.     Vitamin D (Ergocalciferol) 50000 UNITS Caps capsule  Commonly known as:  DRISDOL  Take 50,000 Units by mouth every Monday.           Follow-up Information    Follow up with Hospice at [facility].   Specialty:  Hospice and Palliative Medicine   Why:  please contact Hospice when you arrive home.   Contact information:   [place] [address] [phone]        Time coordinating discharge: 35 minutes.  Signed:  RAMA,CHRISTINA  Pager [phone] [facility] 06/06/2015, 2:32 PM

## 2015-06-06 NOTE — Progress Notes (Signed)
Daily Progress Note   Patient Name: [name] [name]       Date: 06/06/2015 DOB: 02-Dec-1950  Age: 64 y.o. MRN#: [id number] Attending Physician: Venetia Maxon Rama, MD Primary Care Physician: Harvie Junior, MD Admit Date: 06/02/2015 Life limiting illness stage IV right upper lobe lung cancer with widespread metastases, spinal metastases Reason for Consultation/Follow-up: Establishing goals of care  Subjective: Resting in bed, continues to complain of pain, generalized.  Interval Events:  Remains nauseous, has escalating pain needs   Will recommend addition of hospice services. Will recommend patient going home with hospice towards the end of this hospitalization. Patient agreeable to hospice consult.  Length of Stay: 2 days  Current Medications: Scheduled Meds:  . ALPRAZolam  0.5 mg Oral Daily  . dicyclomine  20 mg Oral TID WC  . DULoxetine  30 mg Oral Daily  . feeding supplement  1 Container Oral TID BM  . gabapentin  400 mg Oral TID  . insulin aspart  0-5 Units Subcutaneous QHS  . insulin aspart  0-9 Units Subcutaneous TID WC  . irbesartan  150 mg Oral Daily  . loratadine  10 mg Oral q morning - 10a  . metoCLOPramide  5 mg Oral TID AC  . metoprolol tartrate  25 mg Oral Daily  . OXcarbazepine  150 mg Oral TID  . OxyCODONE  50 mg Oral Q12H  . pantoprazole  40 mg Oral Daily  . polyethylene glycol  17 g Oral BID  . senna-docusate  2 tablet Oral BID  . [START ON 06/07/2015] Vitamin D (Ergocalciferol)  50,000 Units Oral Q Mon    Continuous Infusions:    PRN Meds: HYDROmorphone (DILAUDID) injection, ipratropium-albuterol, magnesium hydroxide, ondansetron, oxyCODONE  Palliative Performance Scale: 20%     Vital Signs: BP 153/92 mmHg  Pulse 128  Temp(Src) 98.5 F (36.9 C) (Oral)  Resp 16  Ht '5\' 4"'$  (1.626 m)  Wt 51.937 kg (114 lb 8 oz)  BMI 19.64 kg/m2  SpO2 100% SpO2: SpO2: 100 % O2 Device: O2 Device: Not Delivered O2 Flow Rate:    Intake/output summary:    Intake/Output Summary (Last 24 hours) at 06/06/15 1101 Last data filed at 06/05/15 1700  Gross per 24 hour  Intake    480 ml  Output    200 ml  Net    280 ml   LBM:   Baseline Weight: Weight: 51.937 kg (114 lb 8 oz) Most recent weight: Weight: 51.937 kg (114 lb 8 oz)  Physical Exam:  Weak frail, pale-appearing lady resting in bed Lungs are shallow.  S1-S2 Abdomen soft No edema Nonfocal Psychiatric: Mood and affect: Depressed and flat.  Additional Data Reviewed: Recent Labs     06/04/15  0500  06/05/15  0500  WBC  10.5  8.2  HGB  11.6*  10.7*  PLT  13*  8*  NA  137   --   BUN  16   --   CREATININE  0.60   --      Problem List:  Patient Active Problem List   Diagnosis Date Noted  . Absolute anemia   . Symptomatic anemia 06/02/2015  . Encounter for palliative care   . Neuropathic pain   . Constipation due to opioid therapy   . Anxiety and depression   . Lactic acidosis   . SIRS (systemic inflammatory response syndrome) 04/30/2015  . Malnutrition of moderate degree 04/30/2015  . Dehydration   . Hypokalemia   . GERD (gastroesophageal reflux disease)   .  Chronic pain   . AP (abdominal pain)   . Protein-calorie malnutrition, severe 04/04/2015  . Nausea and vomiting 04/02/2015  . Nausea & vomiting 04/02/2015  . Left lateral abdominal pain 04/02/2015  . DM gastroparesis 04/02/2015  . Generalized anxiety disorder 04/02/2015  . Anxiety state   . Palliative care encounter   . Depression   . CN (constipation)   . Metastatic cancer to bone   . Metastatic adenocarcinoma 03/08/2015  . Nausea with vomiting 03/08/2015  . Cancer associated pain 03/08/2015  . Osseous metastasis 02/17/2015  . Uncontrolled pain 02/17/2015  . Metastatic primary lung cancer 02/11/2015  . Radiculopathy 03/05/2014  . Intermittent diarrhea 02/07/2014  . Spinal stenosis of lumbar region L L%-S1 08/10/2013  . Other and unspecified noninfectious gastroenteritis and colitis(558.9)  05/27/2013  . Abdominal pain 05/26/2013  . Hepatitis C 05/26/2013  . Hepatitis B 05/26/2013  . Hypertension 05/26/2013  . Diabetes mellitus 05/26/2013  . Nausea vomiting and diarrhea 03/08/2013  . Abdominal pain, epigastric 03/08/2013  . Enteritis due to Clostridium difficile 01/02/2013  . Tachycardia 12/30/2012  . Bipolar 1 disorder 12/29/2012  . S/P thoracotomy 12/10/2012  . Bipolar disorder 12/08/2012  . Anxiety disorder 12/08/2012  . Lung cancer, right upper lobe 11/07/2012     Palliative Care Assessment & Plan    Code Status:  DNR  Goals of Care:  Good pain management. Continue to discuss comfort measures, hospice arrangements.  Desire for further Chaplaincy support:no  3. Symptom Management:  Remains on IV Dilaudid, OxyContin, when necessary oxycodone. Continue to monitor. Also on bowel regimen.  4. Palliative Prophylaxis:  Stool Softener: Yes  5. Prognosis: Few weeks-few months  5. Discharge Planning: Would recommend Home with Hospice   Care plan was discussed with patient and Dr Rockne Menghini  Thank you for allowing the Palliative Medicine Team to assist in the care of this patient.   Time In: 0900 Time Out: 0925 Total Time 25 Prolonged Time Billed  no     Greater than 50%  of this time was spent counseling and coordinating care related to the above assessment and plan.   Loistine Chance, MD  06/06/2015, 11:01 AM  [phone] Please contact Palliative Medicine Team phone at [phone] for questions and concerns.

## 2015-06-06 NOTE — Care Management Note (Signed)
Case Management Note  Patient Details  Name: [name] [name] MRN: [id number] Date of Birth: 1951-01-26  Subjective/Objective:      Lung cancer              Action/Plan: Home Hospice  Expected Discharge Date:  06/06/2015            Expected Discharge Plan:  Home w Hospice Care  In-House Referral:     Discharge planning Services  CM Consult  Post Acute Care Choice:  Hospice Choice offered to:  Patient  DME Arranged:  [facility] [place], Hospital bed, 3-N-1 DME Agency:  [place]:  RN [facility] Agency:  [facility] [place]  Status of Service:  Completed, signed off  Medicare Important Message Given:    Date Medicare IM Given:    Medicare IM give by:    Date Additional Medicare IM Given:    Additional Medicare Important Message give by:     If discussed at [place] of Stay Meetings, dates discussed:    Additional Comments: Consult for Home Hospice. NCM spoke to pt and gave permission to contact dtr at home, Freda Munro, # [phone]. Offered choice for Home Hospice. Pt agreeable to [facility]. Requesting RW, 3n1 and hospital bed for home. NCM contacted dtr, Ms Rinn and offered choice. Dtr is ok with pt's choice and request for DME. She will be the contact to arrange DME for home. Contacted [facility] weekend oncall RN and requested referral be faxed to office, #[phone]. Erenest Rasher, RN 06/06/2015, 1:20 PM

## 2015-06-07 ENCOUNTER — Telehealth: Payer: Self-pay | Admitting: *Deleted

## 2015-06-07 DIAGNOSIS — C3411 Malignant neoplasm of upper lobe, right bronchus or lung: Principal | ICD-10-CM

## 2015-06-07 DIAGNOSIS — C3490 Malignant neoplasm of unspecified part of unspecified bronchus or lung: Secondary | ICD-10-CM

## 2015-06-07 DIAGNOSIS — E119 Type 2 diabetes mellitus without complications: Secondary | ICD-10-CM

## 2015-06-07 DIAGNOSIS — Z2252 Carrier of viral hepatitis C: Secondary | ICD-10-CM

## 2015-06-07 DIAGNOSIS — C799 Secondary malignant neoplasm of unspecified site: Secondary | ICD-10-CM

## 2015-06-07 DIAGNOSIS — D649 Anemia, unspecified: Secondary | ICD-10-CM

## 2015-06-07 DIAGNOSIS — C7951 Secondary malignant neoplasm of bone: Secondary | ICD-10-CM

## 2015-06-07 DIAGNOSIS — E1165 Type 2 diabetes mellitus with hyperglycemia: Secondary | ICD-10-CM

## 2015-06-07 DIAGNOSIS — D696 Thrombocytopenia, unspecified: Secondary | ICD-10-CM

## 2015-06-07 DIAGNOSIS — I10 Essential (primary) hypertension: Secondary | ICD-10-CM

## 2015-06-07 DIAGNOSIS — R109 Unspecified abdominal pain: Secondary | ICD-10-CM

## 2015-06-07 DIAGNOSIS — R110 Nausea: Secondary | ICD-10-CM

## 2015-06-07 LAB — GLUCOSE, CAPILLARY
GLUCOSE-CAPILLARY: 121 mg/dL — AB (ref 65–99)
GLUCOSE-CAPILLARY: 130 mg/dL — AB (ref 65–99)
Glucose-Capillary: 105 mg/dL — ABNORMAL HIGH (ref 65–99)

## 2015-06-07 LAB — CULTURE, BLOOD (ROUTINE X 2)
CULTURE: NO GROWTH
CULTURE: NO GROWTH

## 2015-06-07 LAB — PREPARE PLATELET PHERESIS: Unit division: 0

## 2015-06-07 NOTE — Telephone Encounter (Signed)
TC from [facility] from Hospice called asking if Dr. Burr Medico will be the attending for hospice and if Dr. Burr Medico is ok with Hospice MD doing symptom management. Please let [facility] [place] know.  [phone]

## 2015-06-07 NOTE — Progress Notes (Signed)
Malai Lady   DOB:08/03/1951   FX#:[id number]   HGD#:[id number]  Subjective: She was admitted last week for worsening pain, labs showed significant anemia and some cytopenia. She received a blood transfusion. Feels better  Objective:  Filed Vitals:   06/07/15 2102  BP: 118/76  Pulse: 117  Temp: 98.7 F (37.1 C)  Resp: 18    Body mass index is 19.64 kg/(m^2).  Intake/Output Summary (Last 24 hours) at 06/07/15 2342 Last data filed at 06/07/15 1443  Gross per 24 hour  Intake    360 ml  Output      0 ml  Net    360 ml     Sclerae unicteric  Oropharynx clear  No peripheral adenopathy  Lungs clear -- no rales or rhonchi  Heart regular rate and rhythm  Abdomen benign  MSK no focal spinal tenderness, no peripheral edema  Neuro nonfocal    CBG (last 3)   Recent Labs  06/07/15 0819 06/07/15 1801 06/07/15 2152  GLUCAP 121* 130* 105*     Labs:  Lab Results  Component Value Date   WBC 8.2 06/05/2015   HGB 10.7* 06/05/2015   HCT 33.0* 06/05/2015   MCV 90.7 06/05/2015   PLT 8* 06/05/2015   NEUTROABS 7.0 06/02/2015    '@LASTCHEMISTRY'$ @  Urine Studies No results for input(s): UHGB, CRYS in the last 72 hours.  Invalid input(s): UACOL, UAPR, USPG, UPH, UTP, UGL, UKET, UBIL, UNIT, UROB, ULEU, UEPI, UWBC, URBC, UBAC, CAST, UCOM, BILUA  Basic Metabolic Panel:  Recent Labs Lab 06/02/15 1524 06/03/15 0500 06/04/15 0500  NA 136 136 137  K 5.4* 4.5 3.8  CL 104 103 100*  CO2 '24 27 28  '$ GLUCOSE 120* 86 98  BUN 43* 24* 16  CREATININE 1.05* 0.66 0.60  CALCIUM 8.4* 8.7* 8.9   GFR Estimated Creatinine Clearance: 56.7 mL/min (by C-G formula based on Cr of 0.6). Liver Function Tests:  Recent Labs Lab 06/02/15 1524 06/03/15 0500  AST 27 33  ALT 15 19  ALKPHOS 427* 412*  BILITOT 0.4 0.9  PROT 6.2* 6.3*  ALBUMIN 3.4* 3.5   No results for input(s): LIPASE, AMYLASE in the last 168 hours. No results for input(s): AMMONIA in the last 168 hours. Coagulation  profile No results for input(s): INR, PROTIME in the last 168 hours.  CBC:  Recent Labs Lab 06/02/15 1524 06/02/15 1614 06/03/15 0500 06/04/15 0500 06/05/15 0500  WBC 9.5  --  9.0 10.5 8.2  NEUTROABS 7.0  --   --   --   --   HGB 5.5* 5.7* 9.0* 11.6* 10.7*  HCT 17.6* 18.6* 27.7* 34.3* 33.0*  MCV 93.6  --  89.1 87.9 90.7  PLT 14*  --  16* 13* 8*   Cardiac Enzymes: No results for input(s): CKTOTAL, CKMB, CKMBINDEX, TROPONINI in the last 168 hours. BNP: Invalid input(s): POCBNP CBG:  Recent Labs Lab 06/06/15 1707 06/06/15 2148 06/07/15 0819 06/07/15 1801 06/07/15 2152  GLUCAP 121* 134* 121* 130* 105*   D-Dimer No results for input(s): DDIMER in the last 72 hours. Hgb A1c No results for input(s): HGBA1C in the last 72 hours. Lipid Profile No results for input(s): CHOL, HDL, LDLCALC, TRIG, CHOLHDL, LDLDIRECT in the last 72 hours. Thyroid function studies No results for input(s): TSH, T4TOTAL, T3FREE, THYROIDAB in the last 72 hours.  Invalid input(s): FREET3 Anemia work up No results for input(s): VITAMINB12, FOLATE, FERRITIN, TIBC, IRON, RETICCTPCT in the last 72 hours. Microbiology Recent Results (from the past 240  hour(s))  Culture, blood (routine x 2)     Status: None   Collection Time: 06/02/15  2:55 PM  Result Value Ref Range Status   Specimen Description BLOOD RIGHT HAND  Final   Special Requests BOTTLES DRAWN AEROBIC ONLY [place]  Final   Culture   Final    NO GROWTH 5 DAYS Performed at [facility]    Report Status 06/07/2015 FINAL  Final  Culture, blood (routine x 2)     Status: None   Collection Time: 06/02/15  3:00 PM  Result Value Ref Range Status   Specimen Description BLOOD PORTA CATH  Final   Special Requests BOTTLES DRAWN AEROBIC AND ANAEROBIC 10CC  Final   Culture   Final    NO GROWTH 5 DAYS Performed at [facility]    Report Status 06/07/2015 FINAL  Final  Urine culture     Status: None   Collection Time: 06/02/15  4:03 PM   Result Value Ref Range Status   Specimen Description URINE, RANDOM  Final   Special Requests NONE  Final   Culture   Final    >=100,000 COLONIES/mL ENTEROCOCCUS SPECIES Performed at [facility]    Report Status 06/05/2015 FINAL  Final   Organism ID, Bacteria ENTEROCOCCUS SPECIES  Final      Susceptibility   Enterococcus species - MIC*    AMPICILLIN <=2 SENSITIVE Sensitive     LEVOFLOXACIN 1 SENSITIVE Sensitive     NITROFURANTOIN <=16 SENSITIVE Sensitive     VANCOMYCIN 2 SENSITIVE Sensitive     LINEZOLID 1 SENSITIVE Sensitive     * >=100,000 COLONIES/mL ENTEROCOCCUS SPECIES      Studies:  No results found.  Assessment: 64 y.o. 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   1. 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 Intermittent abdominal pain and nausea 3. Bipolar 4. Anxiety/ddepression 5. DM 6. HTN 7. Hep C 8. Worsening anemia and thrombocytopenia, likely related to her diffuse bone metastasis    Plan: -I agree with hospice, which she has singed  -I'll remain to be her M.D. when she is under hospice care    Truitt Merle, MD 06/07/2015  11:42 PM

## 2015-06-07 NOTE — Care Management Important Message (Signed)
Important Message  Patient Details  Name: Krisi Azua MRN: [id number] Date of Birth: 1951/02/01   Medicare Important Message Given:  Yes-second notification given    Camillo Flaming 06/07/2015, 11:51 AMImportant Message  Patient Details  Name: Brynlei Klausner MRN: [id number] Date of Birth: [birth year]   Medicare Important Message Given:  Yes-second notification given    Camillo Flaming 06/07/2015, 11:51 AM

## 2015-06-07 NOTE — Progress Notes (Signed)
Notified by Joen Laura of family request for Hospice and [place] services at home after discharge. Chart and patient Information currently under review to confirm hospice eligibility.   Spoke with patient, at bedside to initiate education related to hospice philosophy, services and team approach to care. Family verbalized understanding of the information provided. Per discussion plan is for discharge to home by personal vehicle with daughter today.   Please send signed completed DNR form home with patient.  Patient will need prescriptions for discharge comfort medications.   DME needs discussed and patient requested hospital bed with 1/2 rails, APP overlay, bedside table, BSC, and front-wheeled rolling walker for delivery to the home today.  [facility] equipment manager Jewel Ysidro Evert notified and will contact [place] to arrange delivery to the home.  The home address has been verified and is correct in the chart; [place] family member to be contacted to arrange time of delivery.   [facility] aware of the above.  Completed discharge summary will need to be faxed to [facility] at [phone] when final.   Please notify [facility] when patient is ready to leave unit at discharge-call [phone].  [facility] information and contact numbers have been given to patient during visit.   Please call with any questions.  Annia Belt RN, [facility] Liaison  [phone]

## 2015-06-07 NOTE — Evaluation (Signed)
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 [name] up adlib in room. Ambulated x 400' with RW. No further PT needs in acute care. Will sign off.    Follow Up Recommendations No PT follow up    Equipment Recommendations  None recommended by PT    Recommendations for Other Services       Precautions / Restrictions Precautions Precautions: None      Mobility  Bed Mobility Overal bed mobility: Independent                Transfers Overall transfer level: Independent                  Ambulation/Gait Ambulation/Gait assistance: Modified independent (Device/Increase time) Ambulation Distance (Feet): 400 Feet Assistive device: Rolling walker (2 wheeled)       General Gait Details: up ad lib in room without RW.  Stairs            Wheelchair Mobility    Modified Rankin (Stroke Patients Only)       Balance Overall balance assessment: Independent                                           Pertinent Vitals/Pain Pain Assessment: Faces Faces Pain Scale: Hurts even more Pain Location: Lump on L thigh and abdomen Pain Descriptors / Indicators: Aching Pain Intervention(s): Monitored during session    Home Living Family/patient expects to be discharged to:: Private residence   Available Help at Discharge: Family Type of Home: Apartment Home Access: Stairs to enter Entrance  Stairs-Rails: Psychiatric nurse of Steps: 4 Home Layout: Able to live on main level with bedroom/bathroom Home Equipment: Walker - 2 wheels;Cane - single point;Toilet riser      Prior Function Level of Independence: Independent with assistive device(s)         Comments: uses assistive device as needed due to pain     Hand Dominance        Extremity/Trunk Assessment   Upper Extremity Assessment: Overall WFL for tasks assessed           Lower Extremity Assessment: Overall WFL for tasks assessed         Communication   Communication: No difficulties  Cognition Arousal/Alertness: Awake/alert Behavior During Therapy: WFL for tasks assessed/performed Overall Cognitive Status: Within Functional Limits for tasks assessed                      General Comments      Exercises        Assessment/Plan    PT Assessment Patent does not need any further PT services  PT Diagnosis     PT Problem List    PT Treatment Interventions     PT Goals (Current goals can be found in the Care Plan section) Acute Rehab PT Goals PT Goal Formulation: All assessment and  education complete, DC therapy    Frequency     Barriers to discharge        Co-evaluation               End of Session   Activity Tolerance: Patient tolerated treatment well Patient left: in bed Nurse Communication: Mobility status         Time: 1015-1030 PT Time Calculation (min) (ACUTE ONLY): 15 min   Charges:   PT Evaluation $Initial PT Evaluation Tier I: 1 Procedure     PT G CodesClaretha Cooper 06/07/2015, 10:45 AM Tresa Endo PT [phone]

## 2015-06-07 NOTE — Telephone Encounter (Signed)
Received call from [facility] re:  Pt will be discharged home from hospital today.  Dewaine Oats wanted to know if Dr. Burr Medico will be the attending for hospice services.   Spoke with Olegario Shearer, new pt referral at [facility] and informed her re:  Dr. Burr Medico will be the attending , and requesting hospice mds to assist with symptom management.  Vicky voiced understanding.

## 2015-06-07 NOTE — Progress Notes (Signed)
Progress Note   [name] [name] NIO:[id number] DOB: [birth year] DOA: 06/02/2015 PCP: Harvie Junior, MD   Brief Narrative:   [name] [name] is an 64 y.o. 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 Upon initial evaluation, hemoglobin was 5.5, platelets 14, potassium 5.4 and creatinine 1.05.  Assessment/Plan:   Principal Problem:   Symptomatic anemia / anemia associated with malignancy - Status post 4 U PRBC transfusion and with appropriate increase in post transfusion Hg.  Active Problems:   Asymptomatic bactiuria / Enterococcal colonization  - Denies dysuria, frequency or incomplete emptying of bladder. - Hold off on anti-biotics for now.    Severe thrombocytopenia associated with malignancy - Transfuse as needed for platelets less than 10,000 or active bleeding.    Hepatitis C - Transaminase levels WNL. Alkaline phosphatase slightly elevated, likely from bony metastasis.    Hypertension - Continue home medical regimen with Irbesartan and Metoprolol.     Diabetes mellitus - Metformin on hold. Currently being managed with SSI, insulin sensitive scale Q AC/HS. CBG 107-161.    Metastatic primary lung cancer, stage IV - Oncology notified, however has not been a candidate for treatment in the past.    Uncontrolled pain - Continue pain control efforts. Increase Oxycontin increased to 50 mg BID 06/05/15. -    Constipation due to opioid therapy - Continue bowel regimen.    DVT Prophylaxis - SCDs ordered.  Family Communication: No family at bedside.  Declines my offer to call. Disposition Plan: Home with daughter and hospice services in am Code Status: DNR    IV Access:    Peripheral IV   Procedures and diagnostic  studies:   No results found.   Medical Consultants:    Palliative Care  Anti-Infectives:    None.  Subjective:   [name] [name] denies any pain, awaiting to go home with hospice.  Objective:    Filed Vitals:   06/06/15 1930 06/06/15 2038 06/07/15 0516 06/07/15 1443  BP: 128/76 138/80 105/72 110/60  Pulse: 88 129 129 114  Temp: 99.4 F (37.4 C) 99.3 F (37.4 C) 98.2 F (36.8 C) 99 F (37.2 C)  TempSrc: Oral Oral Oral Oral  Resp: '18 18 16 16  '$ Height:      Weight:      SpO2: 94% 98% 98% 100%    Intake/Output Summary (Last 24 hours) at 06/07/15 1733 Last data filed at 06/07/15 0819  Gross per 24 hour  Intake    240 ml  Output      0 ml  Net    240 ml    Exam: Gen:  NAD Cardiovascular:  Tachy, No M/R/G, hyperdynamic prechordium Respiratory:  Lungs CTAB Gastrointestinal:  Abdomen soft, NT/ND, + BS Extremities:  No C/E/C   Data Reviewed:    Labs: Basic Metabolic Panel:  Recent Labs Lab 06/02/15 1524 06/03/15 0500 06/04/15 0500  NA 136 136 137  K 5.4* 4.5 3.8  CL 104 103 100*  CO2 '24 27 28  '$ GLUCOSE 120* 86 98  BUN 43* 24* 16  CREATININE 1.05* 0.66 0.60  CALCIUM 8.4* 8.7* 8.9   GFR Estimated Creatinine Clearance: 56.7 mL/min (by C-G formula based on Cr of 0.6). Liver Function Tests:  Recent Labs Lab 06/02/15 1524 06/03/15 0500  AST 27  33  ALT 15 19  ALKPHOS 427* 412*  BILITOT 0.4 0.9  PROT 6.2* 6.3*  ALBUMIN 3.4* 3.5   CBC:  Recent Labs Lab 06/02/15 1524 06/02/15 1614 06/03/15 0500 06/04/15 0500 06/05/15 0500  WBC 9.5  --  9.0 10.5 8.2  NEUTROABS 7.0  --   --   --   --   HGB 5.5* 5.7* 9.0* 11.6* 10.7*  HCT 17.6* 18.6* 27.7* 34.3* 33.0*  MCV 93.6  --  89.1 87.9 90.7  PLT 14*  --  16* 13* 8*   CBG:  Recent Labs Lab 06/06/15 0728 06/06/15 1200 06/06/15 1707 06/06/15 2148 06/07/15 0819  GLUCAP 109* 174* 121* 134* 121*   Sepsis Labs:  Recent Labs Lab 06/02/15 1521 06/02/15 1524 06/02/15 1810 06/03/15 0500  06/04/15 0500 06/05/15 0500  WBC  --  9.5  --  9.0 10.5 8.2  LATICACIDVEN 3.51*  --  2.32*  --   --   --    Microbiology Recent Results (from the past 240 hour(s))  Culture, blood (routine x 2)     Status: None   Collection Time: 06/02/15  2:55 PM  Result Value Ref Range Status   Specimen Description BLOOD RIGHT HAND  Final   Special Requests BOTTLES DRAWN AEROBIC ONLY [place]  Final   Culture   Final    NO GROWTH 5 DAYS Performed at [facility]    Report Status 06/07/2015 FINAL  Final  Culture, blood (routine x 2)     Status: None   Collection Time: 06/02/15  3:00 PM  Result Value Ref Range Status   Specimen Description BLOOD PORTA CATH  Final   Special Requests BOTTLES DRAWN AEROBIC AND ANAEROBIC 10CC  Final   Culture   Final    NO GROWTH 5 DAYS Performed at [facility]    Report Status 06/07/2015 FINAL  Final  Urine culture     Status: None   Collection Time: 06/02/15  4:03 PM  Result Value Ref Range Status   Specimen Description URINE, RANDOM  Final   Special Requests NONE  Final   Culture   Final    >=100,000 COLONIES/mL ENTEROCOCCUS SPECIES Performed at [facility]    Report Status 06/05/2015 FINAL  Final   Organism ID, Bacteria ENTEROCOCCUS SPECIES  Final      Susceptibility   Enterococcus species - MIC*    AMPICILLIN <=2 SENSITIVE Sensitive     LEVOFLOXACIN 1 SENSITIVE Sensitive     NITROFURANTOIN <=16 SENSITIVE Sensitive     VANCOMYCIN 2 SENSITIVE Sensitive     LINEZOLID 1 SENSITIVE Sensitive     * >=100,000 COLONIES/mL ENTEROCOCCUS SPECIES     Medications:   . ALPRAZolam  0.5 mg Oral Daily  . dicyclomine  20 mg Oral TID WC  . DULoxetine  30 mg Oral Daily  . feeding supplement  1 Container Oral TID BM  . gabapentin  400 mg Oral TID  . insulin aspart  0-5 Units Subcutaneous QHS  . insulin aspart  0-9 Units Subcutaneous TID WC  . irbesartan  150 mg Oral Daily  . loratadine  10 mg Oral q morning - 10a  . metoCLOPramide  5 mg Oral  TID AC  . metoprolol tartrate  25 mg Oral Daily  . OXcarbazepine  150 mg Oral TID  . OxyCODONE  50 mg Oral Q12H  . pantoprazole  40 mg Oral Daily  . polyethylene glycol  17 g Oral BID  . senna-docusate  2 tablet Oral BID  . Vitamin D (Ergocalciferol)  50,000 Units Oral Q Mon   Continuous Infusions:   Time spent: 25 minutes.    LOS: 3 days   [place] [facility] Pager [phone]. If unable to reach me by pager, please call my cell phone at [phone].    06/07/2015, 5:33 PM

## 2015-06-08 LAB — GLUCOSE, CAPILLARY
Glucose-Capillary: 99 mg/dL (ref 65–99)
Glucose-Capillary: 113 mg/dL — ABNORMAL HIGH (ref 65–99)

## 2015-06-08 MED ORDER — HEPARIN SOD (PORK) LOCK FLUSH 100 UNIT/ML IV SOLN
500.0000 [IU] | Freq: Once | INTRAVENOUS | Status: DC
  Filled 2015-06-08: qty 5

## 2015-06-08 NOTE — Progress Notes (Signed)
This CM spoke with daughter who states that all equipment has been delivered to home. [facility] to follow once discharged.  Daughter plans to pick up pt today and transport her home. MD made aware. Marney Doctor RN,BSN,NCM [phone]

## 2015-06-15 ENCOUNTER — Encounter (HOSPITAL_COMMUNITY): Payer: Self-pay | Admitting: Emergency Medicine

## 2015-06-15 ENCOUNTER — Emergency Department (HOSPITAL_COMMUNITY)

## 2015-06-15 ENCOUNTER — Emergency Department (HOSPITAL_COMMUNITY)
Admission: EM | Admit: 2015-06-15 | Discharge: 2015-06-16 | Disposition: A | Discharge status: Home / Self Care | Attending: Emergency Medicine | Admitting: Emergency Medicine

## 2015-06-15 DIAGNOSIS — J449 Chronic obstructive pulmonary disease, unspecified: Secondary | ICD-10-CM | POA: Diagnosis not present

## 2015-06-15 DIAGNOSIS — D649 Anemia, unspecified: Secondary | ICD-10-CM | POA: Diagnosis not present

## 2015-06-15 DIAGNOSIS — Z85118 Personal history of other malignant neoplasm of bronchus and lung: Secondary | ICD-10-CM | POA: Diagnosis not present

## 2015-06-15 DIAGNOSIS — Z87891 Personal history of nicotine dependence: Secondary | ICD-10-CM | POA: Insufficient documentation

## 2015-06-15 DIAGNOSIS — R011 Cardiac murmur, unspecified: Secondary | ICD-10-CM | POA: Insufficient documentation

## 2015-06-15 DIAGNOSIS — K219 Gastro-esophageal reflux disease without esophagitis: Secondary | ICD-10-CM | POA: Diagnosis not present

## 2015-06-15 DIAGNOSIS — I10 Essential (primary) hypertension: Secondary | ICD-10-CM | POA: Diagnosis not present

## 2015-06-15 DIAGNOSIS — Z8619 Personal history of other infectious and parasitic diseases: Secondary | ICD-10-CM | POA: Insufficient documentation

## 2015-06-15 DIAGNOSIS — Z880 Allergy status to penicillin: Secondary | ICD-10-CM | POA: Insufficient documentation

## 2015-06-15 DIAGNOSIS — G8929 Other chronic pain: Secondary | ICD-10-CM | POA: Insufficient documentation

## 2015-06-15 DIAGNOSIS — Z79899 Other long term (current) drug therapy: Secondary | ICD-10-CM | POA: Insufficient documentation

## 2015-06-15 DIAGNOSIS — G43909 Migraine, unspecified, not intractable, without status migrainosus: Secondary | ICD-10-CM | POA: Insufficient documentation

## 2015-06-15 DIAGNOSIS — E119 Type 2 diabetes mellitus without complications: Secondary | ICD-10-CM | POA: Insufficient documentation

## 2015-06-15 DIAGNOSIS — R000 Tachycardia, unspecified: Secondary | ICD-10-CM | POA: Diagnosis not present

## 2015-06-15 DIAGNOSIS — R1030 Lower abdominal pain, unspecified: Secondary | ICD-10-CM | POA: Insufficient documentation

## 2015-06-15 DIAGNOSIS — M1990 Unspecified osteoarthritis, unspecified site: Secondary | ICD-10-CM | POA: Insufficient documentation

## 2015-06-15 DIAGNOSIS — R410 Disorientation, unspecified: Secondary | ICD-10-CM | POA: Diagnosis not present

## 2015-06-15 DIAGNOSIS — R4182 Altered mental status, unspecified: Secondary | ICD-10-CM | POA: Diagnosis present

## 2015-06-15 LAB — COMPREHENSIVE METABOLIC PANEL
ALBUMIN: 2.9 g/dL — AB (ref 3.5–5.0)
ALK PHOS: 306 U/L — AB (ref 38–126)
ALT: 12 U/L — ABNORMAL LOW (ref 14–54)
AST: 25 U/L (ref 15–41)
Anion gap: 10 (ref 5–15)
BUN: 25 mg/dL — AB (ref 6–20)
CALCIUM: 9 mg/dL (ref 8.9–10.3)
CO2: 28 mmol/L (ref 22–32)
Chloride: 102 mmol/L (ref 101–111)
Creatinine, Ser: 0.65 mg/dL (ref 0.44–1.00)
GLUCOSE: 101 mg/dL — AB (ref 65–99)
POTASSIUM: 4.1 mmol/L (ref 3.5–5.1)
SODIUM: 140 mmol/L (ref 135–145)
TOTAL PROTEIN: 5.7 g/dL — AB (ref 6.5–8.1)
Total Bilirubin: 0.5 mg/dL (ref 0.3–1.2)

## 2015-06-15 LAB — CBC WITH DIFFERENTIAL/PLATELET
Basophils Absolute: 0 10*3/uL (ref 0.0–0.1)
Basophils Relative: 1 % (ref 0–1)
Eosinophils Absolute: 0 10*3/uL (ref 0.0–0.7)
Eosinophils Relative: 1 % (ref 0–5)
HCT: 22.1 % — ABNORMAL LOW (ref 36.0–46.0)
Hemoglobin: 7.1 g/dL — ABNORMAL LOW (ref 12.0–15.0)
LYMPHS PCT: 24 % (ref 12–46)
Lymphs Abs: 1 10*3/uL (ref 0.7–4.0)
MCH: 29.3 pg (ref 26.0–34.0)
MCHC: 32.1 g/dL (ref 30.0–36.0)
MCV: 91.3 fL (ref 78.0–100.0)
Monocytes Absolute: 0.4 10*3/uL (ref 0.1–1.0)
Monocytes Relative: 10 % (ref 3–12)
NEUTROS ABS: 2.7 10*3/uL (ref 1.7–7.7)
Neutrophils Relative %: 64 % (ref 43–77)
Platelets: 18 10*3/uL — CL (ref 150–400)
RBC: 2.42 MIL/uL — ABNORMAL LOW (ref 3.87–5.11)
RDW: 18.4 % — ABNORMAL HIGH (ref 11.5–15.5)
WBC: 4.1 10*3/uL (ref 4.0–10.5)
nRBC: 11 /100 WBC — ABNORMAL HIGH

## 2015-06-15 LAB — I-STAT CHEM 8, ED
BUN: 23 mg/dL — ABNORMAL HIGH (ref 6–20)
CALCIUM ION: 1.21 mmol/L (ref 1.13–1.30)
CHLORIDE: 98 mmol/L — AB (ref 101–111)
CREATININE: 0.7 mg/dL (ref 0.44–1.00)
Glucose, Bld: 99 mg/dL (ref 65–99)
HEMATOCRIT: 22 % — AB (ref 36.0–46.0)
HEMOGLOBIN: 7.5 g/dL — AB (ref 12.0–15.0)
POTASSIUM: 4 mmol/L (ref 3.5–5.1)
SODIUM: 137 mmol/L (ref 135–145)
TCO2: 24 mmol/L (ref 0–100)

## 2015-06-15 LAB — URINALYSIS, ROUTINE W REFLEX MICROSCOPIC
Bilirubin Urine: NEGATIVE
GLUCOSE, UA: NEGATIVE mg/dL
Hgb urine dipstick: NEGATIVE
Ketones, ur: NEGATIVE mg/dL
Leukocytes, UA: NEGATIVE
NITRITE: NEGATIVE
Protein, ur: NEGATIVE mg/dL
SPECIFIC GRAVITY, URINE: 1.023 (ref 1.005–1.030)
Urobilinogen, UA: 4 mg/dL — ABNORMAL HIGH (ref 0.0–1.0)
pH: 7.5 (ref 5.0–8.0)

## 2015-06-15 LAB — I-STAT CG4 LACTIC ACID, ED: Lactic Acid, Venous: 2.66 mmol/L (ref 0.5–2.0)

## 2015-06-15 LAB — PREPARE RBC (CROSSMATCH)

## 2015-06-15 MED ORDER — IOHEXOL 300 MG/ML  SOLN
100.0000 mL | Freq: Once | INTRAMUSCULAR | Status: AC | PRN
  Administered 2015-06-15: 80 mL via INTRAVENOUS

## 2015-06-15 MED ORDER — SODIUM CHLORIDE 0.9 % IV SOLN: Freq: Once | INTRAVENOUS | Status: DC

## 2015-06-15 MED ORDER — OXYCODONE HCL 5 MG PO TABS
10.0000 mg | ORAL_TABLET | Freq: Once | ORAL | Status: AC
  Administered 2015-06-15: 10 mg via ORAL
  Filled 2015-06-15: qty 2

## 2015-06-15 MED ORDER — FENTANYL CITRATE (PF) 100 MCG/2ML IJ SOLN
100.0000 ug | Freq: Once | INTRAMUSCULAR | Status: AC
  Administered 2015-06-15: 100 ug via INTRAVENOUS
  Filled 2015-06-15: qty 2

## 2015-06-15 MED ORDER — OXYCODONE-ACETAMINOPHEN 5-325 MG PO TABS
2.0000 | ORAL_TABLET | Freq: Once | ORAL | Status: DC
  Filled 2015-06-15: qty 2

## 2015-06-15 MED ORDER — OXYCODONE HCL 5 MG PO TABS
10.0000 mg | ORAL_TABLET | Freq: Once | ORAL | Status: AC
  Administered 2015-06-16: 10 mg via ORAL
  Filled 2015-06-15: qty 2

## 2015-06-15 NOTE — ED Notes (Signed)
Per EDP blood rate changed to 150 ml/hr

## 2015-06-15 NOTE — ED Provider Notes (Signed)
CSN: [id number]     Arrival date & time 06/15/15  1410 History   First MD Initiated Contact with Patient 06/15/15 1455     Chief Complaint  Patient presents with  . Altered Mental Status     (Consider location/radiation/quality/duration/timing/severity/associated sxs/prior Treatment) HPI  64 year old female with a history of metastatic cancer on hospice (unable to treat with chemo due to platelets <10K and poor compliance in addition to metastatic disease) presents with grandson for AMS and left eye deviation. Patient does not provide much history. Grandson states yesterday he first noticed her left eye deviated laterally, and she was complaining of seeing multiple versions of family. Has been confused intermittently too. Not as much now, but apparently earlier she was putting trash in a pillow case instead of trash can. No fevers, vomiting, or cough. Complains of pain but no new pain, has been having pain from cancer for some time now. Aunt (primary decision maker when patient is altered) requested grandson bring patient in for evaluation.  Past Medical History  Diagnosis Date  . Hypertension   . Bipolar 1 disorder   . Heart murmur   . Sciatica   . Arthritis   . Anxiety   . Diverticulosis   . Chronic low back pain   . DDD (degenerative disc disease), lumbar   . Chronic shoulder pain   . Chronic neck pain   . Hepatitis B     Unclear when initially diagnosed, labs in Epic from 03/09/13  . Hepatitis C     Unclear when initially diagnosed, labs in Epic from 03/09/13  . C. difficile diarrhea     April and February 2014  . COPD (chronic obstructive pulmonary disease)   . GERD (gastroesophageal reflux disease)   . H/O hiatal hernia   . Headache(784.0)     hx  . PONV (postoperative nausea and vomiting)   . Seizures     on meds  . Diabetes mellitus   . Adenocarcinoma of lung, stage 1   . GERD (gastroesophageal reflux disease)    Past Surgical History  Procedure Laterality Date  .  Facial tumor removal Right 2000  . Brain tumor removal  09  . Brain surgery      in [place]  . Video bronchoscopy  12/04/2012    Procedure: VIDEO BRONCHOSCOPY;  Surgeon: Grace Isaac, MD;  Location: [facility] OR;  Service: Thoracic;  Laterality: N/A;  . Video assisted thoracoscopy (vats)/wedge resection  12/04/2012    Procedure: VIDEO ASSISTED THORACOSCOPY (VATS)/WEDGE RESECTION;  Surgeon: Grace Isaac, MD;  Location: [place];  Service: Thoracic;  Laterality: Right;  . Lobectomy  12/04/2012    Procedure: LOBECTOMY;  Surgeon: Grace Isaac, MD;  Location: [place];  Service: Thoracic;  Laterality: Right;  completion of right upper lobectomy and lymph node disection, placement of on q pump  . Tubal ligation    . Tonsillectomy    . Anterior cervical decomp/discectomy fusion N/A 03/05/2014    Procedure: ANTERIOR CERVICAL DECOMPRESSION/DISCECTOMY FUSION 2 LEVELS;  Surgeon: Sinclair Ship, MD;  Location: [place];  Service: Orthopedics;  Laterality: N/A;  Anterior cervical decompression fusion, cervical 4-5, cervical 5-6 with instrumentation, allograft.   Family History  Problem Relation Age of Onset  . Hypertension Father     deceased  . Diabetes Father   . Heart disease Father   . Hyperlipidemia Father   . Hypertension Mother   . Hyperlipidemia Mother   . Diabetes Mother   .  Hypertension Sister   . Hyperlipidemia Sister   . Hypertension Brother   . Hyperlipidemia Brother    History  Substance Use Topics  . Smoking status: Former Smoker -- 0.25 packs/day for 10 years    Types: Cigarettes    Quit date: 07/07/2013  . Smokeless tobacco: Never Used     Comment: Smokes pk q 3 days   marajuna aug  . Alcohol Use: No     Comment: quit 16yr ago   OB History    No data available     Review of Systems  Unable to perform ROS: Mental status change      Allergies  Haldol; Acetaminophen; Ibuprofen; Metronidazole; Morphine and related; and Penicillins  Home Medications    Prior to Admission medications   Medication Sig Start Date End Date Taking? Authorizing Provider  ALPRAZolam (Duanne Moron 1 MG tablet Take 0.5 tablets (0.5 mg total) by mouth daily. 05/20/15   YTruitt Merle MD  dicyclomine (BENTYL) 20 MG tablet Take 20 mg by mouth 3 (three) times daily with meals.    Historical Provider, MD  DULoxetine (CYMBALTA) 30 MG capsule Take 1 capsule (30 mg total) by mouth daily. 05/06/15   AModena Jansky MD  feeding supplement, ENSURE ENLIVE, (ENSURE ENLIVE) LIQD Take 237 mLs by mouth 3 (three) times daily between meals. 06/06/15   Christina P Rama, MD  feeding supplement, RESOURCE BREEZE, (RESOURCE BREEZE) LIQD Take 1 Container by mouth 3 (three) times daily between meals. Patient not taking: Reported on 05/20/2015 05/06/15   AModena Jansky MD  gabapentin (NEURONTIN) 400 MG capsule Take 400 mg by mouth 3 (three) times daily. Nerve pain    Historical Provider, MD  Ipratropium-Albuterol (COMBIVENT RESPIMAT) 20-100 MCG/ACT AERS respimat Inhale 2 puffs into the lungs daily as needed for wheezing or shortness of breath.     Historical Provider, MD  loratadine (CLARITIN) 10 MG tablet Take 10 mg by mouth every morning.     Historical Provider, MD  magnesium hydroxide (MILK OF MAGNESIA) 400 MG/5ML suspension Take 5 mLs by mouth daily as needed for mild constipation. 03/24/15   Costin MKarlyne Greenspan MD  Menthol-Methyl Salicylate (MUSCLE RUB) 10-15 % CREA Apply 1 application topically daily as needed for muscle pain. Left shoulder pain    Historical Provider, MD  metFORMIN (GLUCOPHAGE) 500 MG tablet Take 1 tablet by mouth 2 (two) times daily. 03/30/15   Historical Provider, MD  metoCLOPramide (REGLAN) 5 MG tablet Take 1 tablet (5 mg total) by mouth 3 (three) times daily before meals. 03/12/15   GOswald Hillock MD  metoprolol tartrate (LOPRESSOR) 25 MG tablet Take 25 mg by mouth daily. 05/24/15   Historical Provider, MD  olmesartan (BENICAR) 40 MG tablet Take 1 tablet (40 mg total) by mouth daily.  05/06/15   AModena Jansky MD  omeprazole (PRILOSEC) 20 MG capsule Take 1 capsule (20 mg total) by mouth 2 (two) times daily before a meal. 03/26/15   MTyler Pita MD  ondansetron (ZOFRAN-ODT) 8 MG disintegrating tablet Take 1 tablet (8 mg total) by mouth every 6 (six) hours as needed for nausea or vomiting. 04/28/15   YTruitt Merle MD  OXcarbazepine (TRILEPTAL) 150 MG tablet Take 150 mg by mouth 3 (three) times daily.  11/24/13   Historical Provider, MD  OxyCODONE (OXYCONTIN) 20 mg T12A 12 hr tablet Take 3 tablets (60 mg total) by mouth 3 (three) times daily. 06/06/15   CVenetia MaxonRama, MD  Oxycodone HCl 10 MG TABS Take 1-2  tablets (10-20 mg total) by mouth every 4 (four) hours as needed (moderate pain, severe pain, breakthrough pain). 05/20/15   Truitt Merle, MD  polyethylene glycol (MIRALAX / Floria Raveling) packet Take 17 g by mouth 2 (two) times daily. 05/06/15   Modena Jansky, MD  senna-docusate (SENOKOT-S) 8.6-50 MG per tablet Take 2 tablets by mouth 2 (two) times daily. 05/06/15   Modena Jansky, MD  simvastatin (ZOCOR) 20 MG tablet Take 20 mg by mouth at bedtime.     Historical Provider, MD  Vitamin D, Ergocalciferol, (DRISDOL) 50000 UNITS CAPS capsule Take 50,000 Units by mouth every Monday.     Historical Provider, MD   BP 98/58 mmHg  Pulse 114  Temp(Src) 98.5 F (36.9 C) (Oral)  Resp 20  SpO2 95% Physical Exam  Constitutional: She is oriented to person, place, and time. She appears well-developed and well-nourished.  HENT:  Head: Normocephalic and atraumatic.  Right Ear: External ear normal.  Left Ear: External ear normal.  Nose: Nose normal.  Eyes: Pupils are equal, round, and reactive to light. Right eye exhibits no discharge. Left eye exhibits no discharge.  Patient does not cooperate with EOM exam, however her eye does not appear obviously deviated.  Neck: Neck supple.  Cardiovascular: Regular rhythm and normal heart sounds.  Tachycardia present.   Pulmonary/Chest: Effort normal and  breath sounds normal.  Abdominal: Soft. She exhibits no distension. There is tenderness in the suprapubic area.  Neurological: She is alert and oriented to person, place, and time.  Alert to self and place, confused intermittently and does not follow commands well. She moves all 4 extremities normally spontaneously.  Skin: Skin is warm and dry.  Nursing note and vitals reviewed.   ED Course  Procedures (including critical care time) Labs Review Labs Reviewed  COMPREHENSIVE METABOLIC PANEL - Abnormal; Notable for the following:    Glucose, Bld 101 (*)    BUN 25 (*)    Total Protein 5.7 (*)    Albumin 2.9 (*)    ALT 12 (*)    Alkaline Phosphatase 306 (*)    All other components within normal limits  CBC WITH DIFFERENTIAL/PLATELET - Abnormal; Notable for the following:    RBC 2.42 (*)    Hemoglobin 7.1 (*)    HCT 22.1 (*)    RDW 18.4 (*)    Platelets 18 (*)    nRBC 11 (*)    All other components within normal limits  URINALYSIS, ROUTINE W REFLEX MICROSCOPIC (NOT AT [facility]) - Abnormal; Notable for the following:    Urobilinogen, UA 4.0 (*)    All other components within normal limits  I-STAT CG4 LACTIC ACID, ED - Abnormal; Notable for the following:    Lactic Acid, Venous 2.66 (*)    All other components within normal limits  I-STAT CHEM 8, ED - Abnormal; Notable for the following:    Chloride 98 (*)    BUN 23 (*)    Hemoglobin 7.5 (*)    HCT 22.0 (*)    All other components within normal limits  URINE CULTURE  TYPE AND SCREEN  PREPARE RBC (CROSSMATCH)    Imaging Review Ct Head W Wo Contrast  06/15/2015   CLINICAL DATA:  Confusion, new onset of gaze abnormality. Diplopia. Lung cancer with history of previous brain resection.  EXAM: CT HEAD WITHOUT AND WITH CONTRAST  TECHNIQUE: Contiguous axial images were obtained from the base of the skull through the vertex without and with intravenous contrast  CONTRAST:  1m OMNIPAQUE IOHEXOL 300 MG/ML  SOLN  COMPARISON:  06/02/2015 CT  head. MR head 02/26/2015. PET scan 04/01/2015.  FINDINGS: No evidence for acute infarction, hemorrhage, mass lesion, hydrocephalus, or extra-axial fluid. Encephalomalacia LEFT frontal region related to resection of prior brain tumor.  Post infusion, no abnormal enhancement of the brain or meninges.  Overall normal cerebral volume. Hypoattenuation of white matter, either small vessel disease or post treatment effect.  Widespread osseous disease throughout the calvarium representing metastatic lung cancer. There is destruction of the inner table of the skull from the LEFT parietal metastasis and the potential exists for extra-axial, intracranial extension of osseous tumor although none is present to any significant extent at this time.  There is permeative change in the clivus representing additional tumor. No demonstrable cavernous sinus mass, or bulging of the cavernous sinus. Negative orbits. Negative mastoids. Mucosal thickening in the sphenoid has progressed from priors.  IMPRESSION: Postsurgical changes LEFT frontal lobe. No intra-axial metastatic disease is observed.  Metastatic disease to the calvarium and skull base without demonstrable cavernous sinus or epidural tumor.   Electronically Signed   By: JStaci RighterM.D.   On: 06/15/2015 16:58     EKG Interpretation   Date/Time:  Tuesday June 15 2015 14:18:16 EDT Ventricular Rate:  118 PR Interval:  147 QRS Duration: 79 QT Interval:  339 QTC Calculation: 475 R Axis:   81 Text Interpretation:  Sinus tachycardia Probable left atrial enlargement  Borderline right axis deviation Low voltage, extremity and precordial  leads No significant change since last tracing Confirmed by JWinfred Leeds  MD, SAM ([phone] on 06/15/2015 2:27:18 PM      MDM   Final diagnoses:  Symptomatic anemia  Confusion    Patient's workup is significant for acute on chronic anemia but no obvious cause for the patient's confusion. CT scan shows skull tumor but no evidence  of intracranial tumor. 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 There is no obvious brain lesion that could cause these transient x-ray clear movement issues. She does have seizures and could've had a seizure without someone witnessing. There is also the possibility of the patient having cancer metastasis that could require intrathecal chemotherapy but given that she's not a candidate for IV chemotherapy and is on hospice and do not feel that further workup such as lumbar puncture or acute other imaging is indicated. After long discussion with the daughter, healthcare power of attorney, she would like the patient to be transfused and sent back home with hospice care. Daughter initially refused chest x-ray because she did not want more radiation in spite of me discussing that she has a low-grade temperature she still does not want x-ray to rule out pneumonia.    SSherwood Gambler MD 06/16/15 [phone]

## 2015-06-15 NOTE — ED Notes (Signed)
Pt states she wants her port accessed for blood draws or meds.

## 2015-06-15 NOTE — ED Notes (Signed)
Pt's family reports that pt was confused and had new onset left eye deviation since yesterday.  They put her on oxygen overnight per telephone nursing consult yesterday but decided to come in for evaluation when her symptoms had not resolved today.  Pt states that she has not felt abnormally nauseated but she has been dizzy today.  She states she is seeing double today which is not her baseline.  Her grandson is providing her medical history.

## 2015-06-15 NOTE — ED Notes (Addendum)
Pt reports confusion since yesterday. Pt unsure of date. Oriented to self and place. Obeys commands. Family reports pt could not count fingers and was having double vision when viewing family members. Denies pain or SOB.

## 2015-06-17 LAB — TYPE AND SCREEN
ABO/RH(D): O POS
ANTIBODY SCREEN: NEGATIVE
UNIT DIVISION: 0
Unit division: 0

## 2015-06-17 LAB — URINE CULTURE: Culture: NO GROWTH

## 2015-06-18 ENCOUNTER — Ambulatory Visit (HOSPITAL_BASED_OUTPATIENT_CLINIC_OR_DEPARTMENT_OTHER): Payer: Medicare Other | Admitting: Hematology

## 2015-06-18 ENCOUNTER — Encounter: Payer: Self-pay | Admitting: Hematology

## 2015-06-18 VITALS — BP 119/56 | HR 142 | Temp 99.5°F | Resp 17 | Ht 64.0 in | Wt 116.6 lb

## 2015-06-18 DIAGNOSIS — C7951 Secondary malignant neoplasm of bone: Secondary | ICD-10-CM

## 2015-06-18 DIAGNOSIS — G893 Neoplasm related pain (acute) (chronic): Secondary | ICD-10-CM

## 2015-06-18 DIAGNOSIS — C3411 Malignant neoplasm of upper lobe, right bronchus or lung: Secondary | ICD-10-CM

## 2015-06-18 DIAGNOSIS — D61818 Other pancytopenia: Secondary | ICD-10-CM

## 2015-06-18 DIAGNOSIS — C3410 Malignant neoplasm of upper lobe, unspecified bronchus or lung: Secondary | ICD-10-CM

## 2015-06-18 NOTE — Progress Notes (Signed)
.  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   02/24/2015 Imaging CT chest, abdomen and pelvis showed diffuse bone metastasis throughout the whole spine, sternum, and some of the ribs, skull, and pelvis. brain MRI was negative for metastasis   03/04/2015 Pathology Results left iliac crest lytic bone lesion biopsy showed metastatic adenocarcinoma with abundant mucin, the morphology and IHC are consistent with her prior lung adenocarcinoma.   03/08/2015 - 03/12/2015 Hospital Admission she was admitted for intractable nausea, vomiting, and worsening pain. She was seen by radiation oncologist Dr. Sondra Come during her stay, and her pain medication was adjusted.   03/19/2015 - 04/07/2015 Radiation Therapy palliative radiation to left pelvis, 30 gray in 10 fractions   03/27/2015 Miscellaneous her tumor PD-L1 IHC (-)    Metastatic primary lung cancer   02/11/2015 Initial Diagnosis Metastatic primary lung cancer     HISTORY OF PRESENTING ILLNESS:  [name] [name] 64 y.o. female  with multiple comorbidities including untreatedihepatitis C , stage 1 right lung cancer status post lobectomy in 2014 , who was referred to our clinic because of  her recent abnormal MRI lumbar spine findings, which is highly suspicious for metastatic cancer.  She had lumber spine infusion and screws placement in sep 2014 for her back pain, which improved  after surgery but she has had some residual back pain since then but was tolerable. She reprots worseing back for the past 4-5 month, and it bacame unbearable in the past few weeks and she presented to ED on 02/02/15. Lumber MRI was done which showed diffuse bone lesions which was highly syuspecious for metastases. She also developed worsening left leg weakness in the past 2 months, able to walk independently, and mild numbness at left foot. She has been limping lately due to the back pain, no urine or stool incontinuce. She takes oxycodone twice daily which was prescribed by her primary care physician.    She lost some of appetie, lost 15 lb in the last year, No fever or chills. (+) cough with yellow sputum, and some chest discomfort, no dyspnea on exertion.  She lives with her son, she is able to take care of herself, but not much else. She spends most of time sitting or laying down during daytime.   Her last mammogram was one years ago, last colonoscopy one years ago,  which were all normal normal per patient.   INTERIM HISTORY: She returns for follow-up. She was admitted to hospital on 06/02/2015 for diffuse body ache, and dyspnea. She was found to have severe anemia and thrombocytopenia, received depressions fusion. She went home on July 18 with hospice.  She has been doing moderately well at home since hospital discharge. She still has intermittent body pain, takes pain medication. Denies any fever or chills, no bleeding. Hospice has been following him at home. Her daughter noticed eye deviation and the confusion 2 days ago and brought her to emergency room. She received blood transfusion again and was discharged  home.  MEDICAL HISTORY:  Past Medical History  Diagnosis Date  . Hypertension   . Bipolar 1 disorder   . Heart murmur   . Sciatica   . Arthritis   . Anxiety   . Diverticulosis   . Chronic low back pain   . DDD (degenerative disc disease), lumbar   . Chronic shoulder pain   .  Chronic neck pain   . Hepatitis B     Unclear when initially diagnosed, labs in Epic from 03/09/13  . Hepatitis C     Unclear when initially diagnosed, labs in Epic from 03/09/13  . C. difficile diarrhea     April and February 2014  . COPD (chronic obstructive pulmonary disease)   . GERD (gastroesophageal reflux disease)   . H/O hiatal hernia   . Headache(784.0)     hx  . PONV (postoperative nausea and vomiting)   . Seizures     on meds  . Diabetes mellitus   . Adenocarcinoma of lung, stage 1   . GERD (gastroesophageal reflux disease)     SURGICAL HISTORY: Past Surgical History  Procedure Laterality Date  . Facial tumor removal Right 2000  . Brain tumor removal  09  . Brain surgery      in [place]  . Video bronchoscopy  12/04/2012    Procedure: VIDEO BRONCHOSCOPY;  Surgeon: Grace Isaac, MD;  Location: [facility] OR;  Service: Thoracic;  Laterality: N/A;  . Video assisted thoracoscopy (vats)/wedge resection  12/04/2012    Procedure: VIDEO ASSISTED THORACOSCOPY (VATS)/WEDGE RESECTION;  Surgeon: Grace Isaac, MD;  Location: [place];  Service: Thoracic;  Laterality: Right;  . Lobectomy  12/04/2012    Procedure: LOBECTOMY;  Surgeon: Grace Isaac, MD;  Location: [place];  Service: Thoracic;  Laterality: Right;  completion of right upper lobectomy and lymph node disection, placement of on q pump  . Tubal ligation    . Tonsillectomy    . Anterior cervical decomp/discectomy fusion N/A 03/05/2014    Procedure: ANTERIOR CERVICAL DECOMPRESSION/DISCECTOMY FUSION 2 LEVELS;  Surgeon: Sinclair Ship, MD;  Location: [place];  Service: Orthopedics;  Laterality: N/A;  Anterior cervical decompression fusion, cervical 4-5, cervical 5-6 with instrumentation, allograft.    SOCIAL HISTORY: History   Social History  . Marital Status: Widowed    Spouse Name: N/A  . Number of Children: 5  . Years of Education: N/A   Occupational History  . n/a     patient draws [employer]   Social  History Main Topics  . Smoking status: Former Smoker -- 0.25 packs/day for 10 years    Types: Cigarettes    Quit date: 07/07/2013  . Smokeless tobacco: Never Used     Comment: Smokes pk q 3 days   marajuna aug  . Alcohol Use: No     Comment: quit 1yr ago  . Drug Use: Yes    Special: Hydrocodone, Marijuana     Comment: "maybe about once a month"  . Sexual Activity: No   Other Topics Concern  . Not on file   Social History Narrative    FAMILY HISTORY: Family History  Problem Relation Age of Onset  . Hypertension Father     deceased  . Diabetes Father   . Heart disease Father   . Hyperlipidemia Father   . Hypertension Mother   . Hyperlipidemia Mother   . Diabetes Mother   . Hypertension Sister   . Hyperlipidemia Sister   . Hypertension Brother   .  Hyperlipidemia Brother     ALLERGIES:  is allergic to haldol; acetaminophen; ibuprofen; metronidazole; morphine and related; and penicillins.  MEDICATIONS:  Current Outpatient Prescriptions  Medication Sig Dispense Refill  . ALPRAZolam (XANAX) 1 MG tablet Take 0.5 tablets (0.5 mg total) by mouth daily. 30 tablet 0  . dicyclomine (BENTYL) 20 MG tablet Take 20 mg by mouth 3 (three) times daily with meals.    . feeding supplement, ENSURE ENLIVE, (ENSURE ENLIVE) LIQD Take 237 mLs by mouth 3 (three) times daily between meals. 237 mL 12  . feeding supplement, RESOURCE BREEZE, (RESOURCE BREEZE) LIQD Take 1 Container by mouth 3 (three) times daily between meals.    . gabapentin (NEURONTIN) 400 MG capsule Take 400 mg by mouth 3 (three) times daily. Nerve pain    . Ipratropium-Albuterol (COMBIVENT RESPIMAT) 20-100 MCG/ACT AERS respimat Inhale 2 puffs into the lungs daily as needed for wheezing or shortness of breath.     . magnesium hydroxide (MILK OF MAGNESIA) 400 MG/5ML suspension Take 5 mLs by mouth daily as needed for mild constipation. 118 mL 0  . Menthol-Methyl Salicylate (MUSCLE RUB) 10-15 % CREA Apply 1 application topically  daily as needed for muscle pain. Left shoulder pain    . metFORMIN (GLUCOPHAGE) 500 MG tablet Take 1 tablet by mouth 2 (two) times daily.  6  . olmesartan (BENICAR) 40 MG tablet Take 1 tablet (40 mg total) by mouth daily. 30 tablet 0  . omeprazole (PRILOSEC) 20 MG capsule Take 1 capsule (20 mg total) by mouth 2 (two) times daily before a meal. 60 capsule 5  . ondansetron (ZOFRAN-ODT) 8 MG disintegrating tablet Take 1 tablet (8 mg total) by mouth every 6 (six) hours as needed for nausea or vomiting. 20 tablet 2  . OXcarbazepine (TRILEPTAL) 150 MG tablet Take 150 mg by mouth 3 (three) times daily.     . OxyCODONE (OXYCONTIN) 20 mg T12A 12 hr tablet Take 3 tablets (60 mg total) by mouth 3 (three) times daily. 180 tablet 0  . Oxycodone HCl 10 MG TABS Take 1-2 tablets (10-20 mg total) by mouth every 4 (four) hours as needed (moderate pain, severe pain, breakthrough pain). 120 tablet 0  . polyethylene glycol (MIRALAX / GLYCOLAX) packet Take 17 g by mouth 2 (two) times daily. 30 each 0  . senna-docusate (SENOKOT-S) 8.6-50 MG per tablet Take 2 tablets by mouth 2 (two) times daily. 60 tablet 0  . simvastatin (ZOCOR) 20 MG tablet Take 20 mg by mouth at bedtime.     . DULoxetine (CYMBALTA) 30 MG capsule Take 1 capsule (30 mg total) by mouth daily. (Patient not taking: Reported on 06/18/2015) 30 capsule 0  . loratadine (CLARITIN) 10 MG tablet Take 10 mg by mouth every morning.     . metoCLOPramide (REGLAN) 5 MG tablet Take 1 tablet (5 mg total) by mouth 3 (three) times daily before meals. 30 tablet 2  . metoprolol tartrate (LOPRESSOR) 25 MG tablet Take 25 mg by mouth daily.    . Vitamin D, Ergocalciferol, (DRISDOL) 50000 UNITS CAPS capsule Take 50,000 Units by mouth every Monday.      No current facility-administered medications for this visit.    REVIEW OF SYSTEMS:   Constitutional: Denies fevers, chills or abnormal night sweats, (+) malaise and weight loss  Eyes: Denies blurriness of vision, double vision  or watery eyes Ears, nose, mouth, throat, and face: Denies mucositis or sore throat Respiratory:(+) cpugh, no dyspnea or wheezes Cardiovascular: Denies palpitation, chest discomfort or  lower extremity swelling Gastrointestinal:  Denies nausea, heartburn or change in bowel habits Skin: Denies abnormal skin rashes Lymphatics: Denies new lymphadenopathy or easy bruising Neurological:Denies numbness, tingling or new weaknesses Behavioral/Psych: Mood is stable, no new changes  All other systems were reviewed with the patient and are negative.  PHYSICAL EXAMINATION: ECOG PERFORMANCE STATUS: 3  Filed Vitals:   06/18/15 1313  BP: 119/56  Pulse: 142  Temp: 99.5 F (37.5 C)  Resp: 17   Filed Weights   06/18/15 1313  Weight: 116 lb 9.6 oz (52.889 kg)    GENERAL:alert, not in acute distress. SKIN: skin color, texture, turgor are normal, no rashes or significant lesions. EYES: normal, conjunctiva are pink and non-injected, sclera clear OROPHARYNX:no exudate, no erythema and lips, buccal mucosa, and tongue normal  NECK: supple, thyroid normal size, non-tender, without nodularity LYMPH:  no palpable lymphadenopathy in the cervical, axillary or inguinal LUNGS: clear to auscultation and percussion with normal breathing effort HEART: regular rate & rhythm and no murmurs and no lower extremity edema ABDOMEN:abdomen soft, non-tender and normal bowel sounds Musculoskeletal:no cyanosis of digits and no clubbing no significant tenderness on spine  PSYCH: alert & oriented x 3 with fluent speech NEURO: no focal motor/sensory deficits LEG: no edema   LABORATORY DATA:  I have reviewed the data as listed CBC Latest Ref Rng 06/15/2015 06/15/2015 06/05/2015  WBC 4.0 - 10.5 K/uL - 4.1 8.2  Hemoglobin 12.0 - 15.0 g/dL 7.5(L) 7.1(L) 10.7(L)  Hematocrit 36.0 - 46.0 % 22.0(L) 22.1(L) 33.0(L)  Platelets 150 - 400 K/uL - 18(LL) 8(LL)    CMP Latest Ref Rng 06/15/2015 06/15/2015 06/04/2015  Glucose 65 - 99  mg/dL 99 101(H) 98  BUN 6 - 20 mg/dL 23(H) 25(H) 16  Creatinine 0.44 - 1.00 mg/dL 0.70 0.65 0.60  Sodium 135 - 145 mmol/L 137 140 137  Potassium 3.5 - 5.1 mmol/L 4.0 4.1 3.8  Chloride 101 - 111 mmol/L 98(L) 102 100(L)  CO2 22 - 32 mmol/L - 28 28  Calcium 8.9 - 10.3 mg/dL - 9.0 8.9  Total Protein 6.5 - 8.1 g/dL - 5.7(L) -  Total Bilirubin 0.3 - 1.2 mg/dL - 0.5 -  Alkaline Phos 38 - 126 U/L - 306(H) -  AST 15 - 41 U/L - 25 -  ALT 14 - 54 U/L - 12(L) -     Pathology report Bone, biopsy, lytic lesion involving the left iliac crest 03/04/2015 - METASTATIC ADENOCARCINOMA WITH ABUNDANT MUCIN. Microscopic Comment The morphologic features are compatible with metastasis from the previous right upper lobe well-differentiated adenocarcinoma with abundant mucin from 12/12/2012 [phone]). ADDENDUM: At the request of Dr. Burr Medico, immunohistochemistry is performed and the tumor cells are positive with cytokeratin 7, cytokeratin 20, and CDX2. Napsin A, thyroid transcription factor-1, estrogen receptor, progesterone receptor, and gross cystic disease fluid protein are negative. The immunophenotype is similar to the immunophenotype reported for the previous adenocarcinoma with abundant mucin from the lung.   RADIOGRAPHIC STUDIES: I have personally reviewed the radiological images as listed and agreed with the findings in the report. No new scans   ASSESSMENT & PLAN:  64 year old female, with significant history of smoking and stage I right lung adenocarcinoma status post lobectomy in 2014, no presented with was any back pain and diffuse bone lesions on the lumbar spine MRI.  1. 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 -I'll remain to be her M.D. when she is under hospice care.  -I reviewed her  medication with her and her daughter today, and suggest her to stop Cymbalta and Zocor. She will call hospice program to refill her medication. -Her pain is fairly controlled, continue pain medication -I'll see her as needed only. No follow up appointment is scheduled at this point. -I encouraged her to call hospice first if she has any concerns or issues, instead of going to emergency room.   All questions were answered. The patient knows to call the clinic with any problems, questions or concerns. I spent 20 minutes counseling the patient face to face. The total time spent in the appointment was 25 minutes and more than 50% was on counseling.    Truitt Merle, MD 06/18/2015 2:13 PM

## 2015-06-19 ENCOUNTER — Telehealth (HOSPITAL_COMMUNITY): Payer: Self-pay | Admitting: Emergency Medicine

## 2015-07-04 ENCOUNTER — Inpatient Hospital Stay (HOSPITAL_COMMUNITY)

## 2015-07-04 ENCOUNTER — Encounter (HOSPITAL_COMMUNITY): Payer: Self-pay | Admitting: *Deleted

## 2015-07-04 ENCOUNTER — Inpatient Hospital Stay (HOSPITAL_COMMUNITY)
Admission: EM | Admit: 2015-07-04 | Discharge: 2015-07-07 | DRG: 682 | Disposition: A | Discharge status: Home / Self Care | Attending: Family Medicine | Admitting: Family Medicine

## 2015-07-04 DIAGNOSIS — F411 Generalized anxiety disorder: Secondary | ICD-10-CM | POA: Diagnosis present

## 2015-07-04 DIAGNOSIS — F419 Anxiety disorder, unspecified: Secondary | ICD-10-CM | POA: Diagnosis present

## 2015-07-04 DIAGNOSIS — B1920 Unspecified viral hepatitis C without hepatic coma: Secondary | ICD-10-CM | POA: Diagnosis present

## 2015-07-04 DIAGNOSIS — J449 Chronic obstructive pulmonary disease, unspecified: Secondary | ICD-10-CM | POA: Diagnosis present

## 2015-07-04 DIAGNOSIS — D649 Anemia, unspecified: Secondary | ICD-10-CM | POA: Diagnosis present

## 2015-07-04 DIAGNOSIS — Z66 Do not resuscitate: Secondary | ICD-10-CM | POA: Diagnosis present

## 2015-07-04 DIAGNOSIS — R627 Adult failure to thrive: Secondary | ICD-10-CM | POA: Diagnosis present

## 2015-07-04 DIAGNOSIS — C801 Malignant (primary) neoplasm, unspecified: Secondary | ICD-10-CM

## 2015-07-04 DIAGNOSIS — D696 Thrombocytopenia, unspecified: Secondary | ICD-10-CM | POA: Diagnosis present

## 2015-07-04 DIAGNOSIS — B1910 Unspecified viral hepatitis B without hepatic coma: Secondary | ICD-10-CM | POA: Diagnosis present

## 2015-07-04 DIAGNOSIS — N179 Acute kidney failure, unspecified: Secondary | ICD-10-CM | POA: Diagnosis present

## 2015-07-04 DIAGNOSIS — G893 Neoplasm related pain (acute) (chronic): Secondary | ICD-10-CM | POA: Diagnosis present

## 2015-07-04 DIAGNOSIS — R6510 Systemic inflammatory response syndrome (SIRS) of non-infectious origin without acute organ dysfunction: Secondary | ICD-10-CM | POA: Diagnosis present

## 2015-07-04 DIAGNOSIS — F319 Bipolar disorder, unspecified: Secondary | ICD-10-CM | POA: Diagnosis present

## 2015-07-04 DIAGNOSIS — C799 Secondary malignant neoplasm of unspecified site: Secondary | ICD-10-CM | POA: Diagnosis present

## 2015-07-04 DIAGNOSIS — C3490 Malignant neoplasm of unspecified part of unspecified bronchus or lung: Secondary | ICD-10-CM | POA: Diagnosis present

## 2015-07-04 DIAGNOSIS — Z79891 Long term (current) use of opiate analgesic: Secondary | ICD-10-CM | POA: Diagnosis not present

## 2015-07-04 DIAGNOSIS — Z86718 Personal history of other venous thrombosis and embolism: Secondary | ICD-10-CM | POA: Diagnosis not present

## 2015-07-04 DIAGNOSIS — R296 Repeated falls: Secondary | ICD-10-CM | POA: Diagnosis present

## 2015-07-04 DIAGNOSIS — A419 Sepsis, unspecified organism: Secondary | ICD-10-CM

## 2015-07-04 DIAGNOSIS — I959 Hypotension, unspecified: Secondary | ICD-10-CM | POA: Diagnosis present

## 2015-07-04 DIAGNOSIS — M1990 Unspecified osteoarthritis, unspecified site: Secondary | ICD-10-CM | POA: Diagnosis present

## 2015-07-04 DIAGNOSIS — I10 Essential (primary) hypertension: Secondary | ICD-10-CM | POA: Diagnosis present

## 2015-07-04 DIAGNOSIS — K219 Gastro-esophageal reflux disease without esophagitis: Secondary | ICD-10-CM | POA: Diagnosis present

## 2015-07-04 DIAGNOSIS — Z7951 Long term (current) use of inhaled steroids: Secondary | ICD-10-CM

## 2015-07-04 DIAGNOSIS — Z79899 Other long term (current) drug therapy: Secondary | ICD-10-CM | POA: Diagnosis not present

## 2015-07-04 DIAGNOSIS — R4182 Altered mental status, unspecified: Secondary | ICD-10-CM | POA: Insufficient documentation

## 2015-07-04 DIAGNOSIS — E876 Hypokalemia: Secondary | ICD-10-CM | POA: Diagnosis present

## 2015-07-04 DIAGNOSIS — C7951 Secondary malignant neoplasm of bone: Secondary | ICD-10-CM | POA: Diagnosis present

## 2015-07-04 DIAGNOSIS — N19 Unspecified kidney failure: Secondary | ICD-10-CM | POA: Diagnosis not present

## 2015-07-04 DIAGNOSIS — Z888 Allergy status to other drugs, medicaments and biological substances status: Secondary | ICD-10-CM | POA: Diagnosis not present

## 2015-07-04 DIAGNOSIS — E860 Dehydration: Secondary | ICD-10-CM | POA: Diagnosis present

## 2015-07-04 DIAGNOSIS — Z515 Encounter for palliative care: Secondary | ICD-10-CM

## 2015-07-04 DIAGNOSIS — M545 Low back pain: Secondary | ICD-10-CM | POA: Diagnosis present

## 2015-07-04 DIAGNOSIS — R011 Cardiac murmur, unspecified: Secondary | ICD-10-CM | POA: Diagnosis present

## 2015-07-04 DIAGNOSIS — E861 Hypovolemia: Secondary | ICD-10-CM | POA: Diagnosis present

## 2015-07-04 DIAGNOSIS — G9340 Encephalopathy, unspecified: Secondary | ICD-10-CM

## 2015-07-04 DIAGNOSIS — E43 Unspecified severe protein-calorie malnutrition: Secondary | ICD-10-CM | POA: Diagnosis present

## 2015-07-04 DIAGNOSIS — Z87891 Personal history of nicotine dependence: Secondary | ICD-10-CM

## 2015-07-04 DIAGNOSIS — C3410 Malignant neoplasm of upper lobe, unspecified bronchus or lung: Secondary | ICD-10-CM

## 2015-07-04 DIAGNOSIS — Z885 Allergy status to narcotic agent status: Secondary | ICD-10-CM | POA: Diagnosis not present

## 2015-07-04 DIAGNOSIS — R109 Unspecified abdominal pain: Secondary | ICD-10-CM

## 2015-07-04 DIAGNOSIS — R000 Tachycardia, unspecified: Secondary | ICD-10-CM | POA: Diagnosis present

## 2015-07-04 DIAGNOSIS — E119 Type 2 diabetes mellitus without complications: Secondary | ICD-10-CM | POA: Diagnosis present

## 2015-07-04 LAB — CBC
HEMATOCRIT: 28.3 % — AB (ref 36.0–46.0)
Hemoglobin: 9.2 g/dL — ABNORMAL LOW (ref 12.0–15.0)
MCH: 28.6 pg (ref 26.0–34.0)
MCHC: 32.5 g/dL (ref 30.0–36.0)
MCV: 87.9 fL (ref 78.0–100.0)
PLATELETS: 19 10*3/uL — AB (ref 150–400)
RBC: 3.22 MIL/uL — ABNORMAL LOW (ref 3.87–5.11)
RDW: 18 % — ABNORMAL HIGH (ref 11.5–15.5)
WBC: 4.7 10*3/uL (ref 4.0–10.5)

## 2015-07-04 LAB — URINALYSIS, ROUTINE W REFLEX MICROSCOPIC
GLUCOSE, UA: NEGATIVE mg/dL
HGB URINE DIPSTICK: NEGATIVE
KETONES UR: 15 mg/dL — AB
Nitrite: NEGATIVE
PROTEIN: 30 mg/dL — AB
Specific Gravity, Urine: 1.025 (ref 1.005–1.030)
UROBILINOGEN UA: 0.2 mg/dL (ref 0.0–1.0)
pH: 5 (ref 5.0–8.0)

## 2015-07-04 LAB — COMPREHENSIVE METABOLIC PANEL
ALBUMIN: 2.9 g/dL — AB (ref 3.5–5.0)
ALT: 17 U/L (ref 14–54)
ANION GAP: 19 — AB (ref 5–15)
AST: 40 U/L (ref 15–41)
Alkaline Phosphatase: 340 U/L — ABNORMAL HIGH (ref 38–126)
BUN: 117 mg/dL — ABNORMAL HIGH (ref 6–20)
CALCIUM: 8.9 mg/dL (ref 8.9–10.3)
CO2: 26 mmol/L (ref 22–32)
Chloride: 90 mmol/L — ABNORMAL LOW (ref 101–111)
Creatinine, Ser: 7.51 mg/dL — ABNORMAL HIGH (ref 0.44–1.00)
GFR calc Af Amer: 6 mL/min — ABNORMAL LOW (ref >60)
GFR calc non Af Amer: 5 mL/min — ABNORMAL LOW (ref >60)
Glucose, Bld: 99 mg/dL (ref 65–99)
Potassium: 4.3 mmol/L (ref 3.5–5.1)
SODIUM: 135 mmol/L (ref 135–145)
Total Bilirubin: 1.1 mg/dL (ref 0.3–1.2)
Total Protein: 5.9 g/dL — ABNORMAL LOW (ref 6.5–8.1)

## 2015-07-04 LAB — TYPE AND SCREEN
ABO/RH(D): O POS
ANTIBODY SCREEN: NEGATIVE

## 2015-07-04 LAB — CK: Total CK: 782 U/L — ABNORMAL HIGH (ref 38–234)

## 2015-07-04 LAB — PROTIME-INR
INR: 1.26 (ref 0.00–1.49)
PROTHROMBIN TIME: 15.9 s — AB (ref 11.6–15.2)

## 2015-07-04 LAB — URINE MICROSCOPIC-ADD ON

## 2015-07-04 LAB — LACTIC ACID, PLASMA: Lactic Acid, Venous: 1.7 mmol/L (ref 0.5–2.0)

## 2015-07-04 LAB — APTT: aPTT: 27 seconds (ref 24–37)

## 2015-07-04 LAB — CBG MONITORING, ED: Glucose-Capillary: 118 mg/dL — ABNORMAL HIGH (ref 65–99)

## 2015-07-04 MED ORDER — IPRATROPIUM-ALBUTEROL 0.5-2.5 (3) MG/3ML IN SOLN: 3.0000 mL | Freq: Every day | RESPIRATORY_TRACT | Status: DC | PRN

## 2015-07-04 MED ORDER — ONDANSETRON HCL 4 MG/2ML IJ SOLN
4.0000 mg | Freq: Four times a day (QID) | INTRAMUSCULAR | Status: DC | PRN
  Administered 2015-07-06 – 2015-07-07 (×4): 4 mg via INTRAVENOUS
  Filled 2015-07-04 (×4): qty 2

## 2015-07-04 MED ORDER — BISACODYL 10 MG RE SUPP
10.0000 mg | Freq: Every day | RECTAL | Status: DC | PRN
  Administered 2015-07-06: 10 mg via RECTAL
  Filled 2015-07-04: qty 1

## 2015-07-04 MED ORDER — LORAZEPAM 0.5 MG PO TABS
0.5000 mg | ORAL_TABLET | Freq: Two times a day (BID) | ORAL | Status: DC | PRN
  Administered 2015-07-05 – 2015-07-07 (×3): 0.5 mg via ORAL
  Filled 2015-07-04 (×3): qty 1

## 2015-07-04 MED ORDER — VANCOMYCIN HCL IN DEXTROSE 1-5 GM/200ML-% IV SOLN
1000.0000 mg | Freq: Once | INTRAVENOUS | Status: AC
  Administered 2015-07-04: 1000 mg via INTRAVENOUS
  Filled 2015-07-04: qty 200

## 2015-07-04 MED ORDER — MORPHINE SULFATE 2 MG/ML IJ SOLN: 2.0000 mg | INTRAMUSCULAR | Status: DC | PRN

## 2015-07-04 MED ORDER — ONDANSETRON HCL 4 MG PO TABS
4.0000 mg | ORAL_TABLET | Freq: Four times a day (QID) | ORAL | Status: DC | PRN
Start: 2015-07-04 — End: 2015-07-07

## 2015-07-04 MED ORDER — SODIUM CHLORIDE 0.9 % IV SOLN
125.0000 mg | INTRAVENOUS | Status: AC
  Administered 2015-07-04: 125 mg via INTRAVENOUS
  Filled 2015-07-04: qty 125

## 2015-07-04 MED ORDER — SODIUM CHLORIDE 0.9 % IV SOLN
INTRAVENOUS | Status: DC
  Administered 2015-07-04 – 2015-07-07 (×2): via INTRAVENOUS

## 2015-07-04 MED ORDER — IPRATROPIUM-ALBUTEROL 20-100 MCG/ACT IN AERS: 2.0000 | INHALATION_SPRAY | Freq: Every day | RESPIRATORY_TRACT | Status: DC | PRN

## 2015-07-04 MED ORDER — SODIUM CHLORIDE 0.9 % IV BOLUS (SEPSIS)
1000.0000 mL | Freq: Once | INTRAVENOUS | Status: AC
  Administered 2015-07-04: 1000 mL via INTRAVENOUS

## 2015-07-04 MED ORDER — MUSCLE RUB 10-15 % EX CREA
1.0000 "application " | TOPICAL_CREAM | Freq: Every day | CUTANEOUS | Status: DC | PRN
  Filled 2015-07-04: qty 85

## 2015-07-04 MED ORDER — SODIUM CHLORIDE 0.9 % IV SOLN
125.0000 mg | Freq: Four times a day (QID) | INTRAVENOUS | Status: DC
  Administered 2015-07-05: 125 mg via INTRAVENOUS
  Filled 2015-07-04 (×4): qty 125

## 2015-07-04 MED ORDER — HEPARIN SODIUM (PORCINE) 5000 UNIT/ML IJ SOLN: 5000.0000 [IU] | Freq: Three times a day (TID) | INTRAMUSCULAR | Status: DC

## 2015-07-04 NOTE — H&P (Signed)
[facility] History and Physical                                                                                    [name] [name], is a 64 y.o. female  MRN: [id number]   DOB - 1951/05/20  Admit Date - 07/04/2015  Outpatient Primary MD for the patient is Harvie Junior, MD  Oncologist:  Dr. Burr Medico  Referring Physician:  Dr. Lavena Bullion  Chief Complaint:   Chief Complaint  Patient presents with  . Altered Mental Status     HPI  [name] [name]  is a 64 y.o. female, who has primary lung cancer with osseous metastasis, unable to tolerate chemotherapy due to poor functional status, diabetes, seizures, history of hepatitis B and hepatitis C. She is on Hospice.  She was brought to the emergency department today by her daughter for weakness, decreased oral intake, and anuria.  Her daughter reports that the patient has been constipated for the last several weeks due to opiate medications. For the last 1 week she has had decreased urination and in the last 3 or 4 days has not urinated at all. In an attempt to create a bowel movement her daughter has been giving the patient mag citrate.  She's had multiple falls in the last 2 weeks. She has become confused.  The patient visited the ER on 06/15/2015 with complaints of confusion, pain and left eye deviation. CT scan was negative for acute stroke. The patient was transfused Blood for severe anemia and thrombocytopenia and sent back home with hospice care.  In speaking with her daughter, [name], she feels that nothing has been done for her mother since she was placed on hospice. She is not ready for comfort care.  She does not want aggressive resuscitation such as chest compressions or intubation, but she states "if you think it would help my mother I want it done."    In the ER the patient is completely confused and unable to give any detailed history. Her BP was 60/37, respirations between 6 - 33, pulse rate between 106 - 146.   Her creatinine has   risen from 0.7 on 7/26 to 7.51 on 8/14.  Alkaline phosphatase is elevated at 340, albumin is 2.9, platelets are 19k.  Review of Systems    Past Medical History  Past Medical History  Diagnosis Date  . Hypertension   . Bipolar 1 disorder   . Heart murmur   . Sciatica   . Arthritis   . Anxiety   . Diverticulosis   . Chronic low back pain   . DDD (degenerative disc disease), lumbar   . Chronic shoulder pain   . Chronic neck pain   . Hepatitis B     Unclear when initially diagnosed, labs in Epic from 03/09/13  . Hepatitis C     Unclear when initially diagnosed, labs in Epic from 03/09/13  . C. difficile diarrhea     April and February 2014  . COPD (chronic obstructive pulmonary disease)   . GERD (gastroesophageal reflux disease)   . H/O hiatal hernia   . Headache(784.0)     hx  . PONV (postoperative nausea and  vomiting)   . Seizures     on meds  . Diabetes mellitus   . Adenocarcinoma of lung, stage 1   . GERD (gastroesophageal reflux disease)     Past Surgical History  Procedure Laterality Date  . Facial tumor removal Right 2000  . Brain tumor removal  09  . Brain surgery      in [place]  . Video bronchoscopy  12/04/2012    Procedure: VIDEO BRONCHOSCOPY;  Surgeon: Grace Isaac, MD;  Location: [facility] OR;  Service: Thoracic;  Laterality: N/A;  . Video assisted thoracoscopy (vats)/wedge resection  12/04/2012    Procedure: VIDEO ASSISTED THORACOSCOPY (VATS)/WEDGE RESECTION;  Surgeon: Grace Isaac, MD;  Location: [facility];  Service: Thoracic;  Laterality: Right;  . Lobectomy  12/04/2012    Procedure: LOBECTOMY;  Surgeon: Grace Isaac, MD;  Location: [facility];  Service: Thoracic;  Laterality: Right;  completion of right upper lobectomy and lymph node disection, placement of on q pump  . Tubal ligation    . Tonsillectomy    . Anterior cervical decomp/discectomy fusion N/A 03/05/2014    Procedure: ANTERIOR CERVICAL DECOMPRESSION/DISCECTOMY FUSION 2 LEVELS;  Surgeon: Sinclair Ship, MD;  Location: [facility];  Service: Orthopedics;  Laterality: N/A;  Anterior cervical decompression fusion, cervical 4-5, cervical 5-6 with instrumentation, allograft.      Social History Social History  Substance Use Topics  . Smoking status: Former Smoker -- 0.25 packs/day for 10 years    Types: Cigarettes    Quit date: 07/07/2013  . Smokeless tobacco: Never Used     Comment: Smokes pk q 3 days   marajuna aug  . Alcohol Use: No     Comment: quit 48yr ago    Family History Family History  Problem Relation Age of Onset  . Hypertension Father     deceased  . Diabetes Father   . Heart disease Father   . Hyperlipidemia Father   . Hypertension Mother   . Hyperlipidemia Mother   . Diabetes Mother   . Hypertension Sister   . Hyperlipidemia Sister   . Hypertension Brother   . Hyperlipidemia Brother     Prior to Admission medications   Medication Sig Start Date End Date Taking? Authorizing Provider  ALPRAZolam (Duanne Moron 1 MG tablet Take 0.5 tablets (0.5 mg total) by mouth daily. 05/20/15   YTruitt Merle MD  dicyclomine (BENTYL) 20 MG tablet Take 20 mg by mouth 3 (three) times daily with meals.    Historical Provider, MD  DULoxetine (CYMBALTA) 30 MG capsule Take 1 capsule (30 mg total) by mouth daily. Patient not taking: Reported on 06/18/2015 05/06/15   AModena Jansky MD  feeding supplement, ENSURE ENLIVE, (ENSURE ENLIVE) LIQD Take 237 mLs by mouth 3 (three) times daily between meals. 06/06/15   Christina P Rama, MD  feeding supplement, RESOURCE BREEZE, (RESOURCE BREEZE) LIQD Take 1 Container by mouth 3 (three) times daily between meals. 05/06/15   AModena Jansky MD  gabapentin (NEURONTIN) 400 MG capsule Take 400 mg by mouth 3 (three) times daily. Nerve pain    Historical Provider, MD  Ipratropium-Albuterol (COMBIVENT RESPIMAT) 20-100 MCG/ACT AERS respimat Inhale 2 puffs into the lungs daily as needed for wheezing or shortness of breath.     Historical Provider, MD   loratadine (CLARITIN) 10 MG tablet Take 10 mg by mouth every morning.     Historical Provider, MD  magnesium hydroxide (MILK OF MAGNESIA) 400 MG/5ML suspension Take 5 mLs by mouth  daily as needed for mild constipation. 03/24/15   Costin Karlyne Greenspan, MD  Menthol-Methyl Salicylate (MUSCLE RUB) 10-15 % CREA Apply 1 application topically daily as needed for muscle pain. Left shoulder pain    Historical Provider, MD  metFORMIN (GLUCOPHAGE) 500 MG tablet Take 1 tablet by mouth 2 (two) times daily. 03/30/15   Historical Provider, MD  metoCLOPramide (REGLAN) 5 MG tablet Take 1 tablet (5 mg total) by mouth 3 (three) times daily before meals. 03/12/15   Oswald Hillock, MD  metoprolol tartrate (LOPRESSOR) 25 MG tablet Take 25 mg by mouth daily. 05/24/15   Historical Provider, MD  olmesartan (BENICAR) 40 MG tablet Take 1 tablet (40 mg total) by mouth daily. 05/06/15   Modena Jansky, MD  omeprazole (PRILOSEC) 20 MG capsule Take 1 capsule (20 mg total) by mouth 2 (two) times daily before a meal. 03/26/15   Tyler Pita, MD  ondansetron (ZOFRAN-ODT) 8 MG disintegrating tablet Take 1 tablet (8 mg total) by mouth every 6 (six) hours as needed for nausea or vomiting. 04/28/15   Truitt Merle, MD  OXcarbazepine (TRILEPTAL) 150 MG tablet Take 150 mg by mouth 3 (three) times daily.  11/24/13   Historical Provider, MD  OxyCODONE (OXYCONTIN) 20 mg T12A 12 hr tablet Take 3 tablets (60 mg total) by mouth 3 (three) times daily. 06/06/15   Venetia Maxon Rama, MD  Oxycodone HCl 10 MG TABS Take 1-2 tablets (10-20 mg total) by mouth every 4 (four) hours as needed (moderate pain, severe pain, breakthrough pain). 05/20/15   Truitt Merle, MD  polyethylene glycol (MIRALAX / Floria Raveling) packet Take 17 g by mouth 2 (two) times daily. 05/06/15   Modena Jansky, MD  senna-docusate (SENOKOT-S) 8.6-50 MG per tablet Take 2 tablets by mouth 2 (two) times daily. 05/06/15   Modena Jansky, MD  simvastatin (ZOCOR) 20 MG tablet Take 20 mg by mouth at bedtime.      Historical Provider, MD  Vitamin D, Ergocalciferol, (DRISDOL) 50000 UNITS CAPS capsule Take 50,000 Units by mouth every Monday.     Historical Provider, MD    Allergies  Allergen Reactions  . Haldol [Haloperidol Decanoate] Other (See Comments)    tongue swelling  . Acetaminophen Other (See Comments)    Stomach pain  . Ibuprofen Other (See Comments)    Stomach pain  . Metronidazole Other (See Comments)    Severe stomach upset--was instructed to avoid Flagyl.  . Morphine And Related Hives    Tolerates hydromorphone.  Marland Kitchen Penicillins Rash    Physical Exam  Vitals  Blood pressure 83/42, pulse 106, temperature 99.2 F (37.3 C), temperature source Oral, resp. rate 8, SpO2 95 %.   General: Thin, confused, AA female lying in bed in NAD, appears dry.  Psych:  Orientated to person.  Mumbles, will give appropriate short answers to questions and then repeat one word  ENT:  Eyes and ears appear normal, MM appear dry.  Neck:  Supple, No lymphadenopathy appreciated  Respiratory:  Symmetrical chest wall movement, Good air movement bilaterally, CTAB.  Cardiac:  Tachycardic, no m/r/g.  Port a cath in place.  Abdomen:  Thin, Positive bowel sounds, Soft, Non tender, Non distended,  No masses appreciated  Skin:  No Cyanosis, No Skin Rash or Bruise.  Extremities:  Able to move all 4. 5/5 strength in each,  no effusions.  Data Review  CBC  Recent Labs Lab 07/04/15 1440  WBC 4.7  HGB 9.2*  HCT 28.3*  PLT 19*  MCV 87.9  MCH 28.6  MCHC 32.5  RDW 18.0*    Chemistries   Recent Labs Lab 07/04/15 1440  NA 135  K 4.3  CL 90*  CO2 26  GLUCOSE 99  BUN 117*  CREATININE 7.51*  CALCIUM 8.9  AST 40  ALT 17  ALKPHOS 340*  BILITOT 1.1    Urinalysis    Component Value Date/Time   COLORURINE YELLOW 06/15/2015 1508   APPEARANCEUR CLEAR 06/15/2015 1508   LABSPEC 1.023 06/15/2015 1508   PHURINE 7.5 06/15/2015 1508   GLUCOSEU NEGATIVE 06/15/2015 1508   HGBUR NEGATIVE  06/15/2015 1508   BILIRUBINUR NEGATIVE 06/15/2015 1508   KETONESUR NEGATIVE 06/15/2015 1508   PROTEINUR NEGATIVE 06/15/2015 1508   UROBILINOGEN 4.0* 06/15/2015 1508   NITRITE NEGATIVE 06/15/2015 1508   LEUKOCYTESUR NEGATIVE 06/15/2015 1508    Imaging results:   Ct Head W Wo Contrast  06/15/2015   CLINICAL DATA:  Confusion, new onset of gaze abnormality. Diplopia. Lung cancer with history of previous brain resection.  EXAM: CT HEAD WITHOUT AND WITH CONTRAST  TECHNIQUE: Contiguous axial images were obtained from the base of the skull through the vertex without and with intravenous contrast  CONTRAST:  46m OMNIPAQUE IOHEXOL 300 MG/ML  SOLN  COMPARISON:  06/02/2015 CT head. MR head 02/26/2015. PET scan 04/01/2015.  FINDINGS: No evidence for acute infarction, hemorrhage, mass lesion, hydrocephalus, or extra-axial fluid. Encephalomalacia LEFT frontal region related to resection of prior brain tumor.  Post infusion, no abnormal enhancement of the brain or meninges.  Overall normal cerebral volume. Hypoattenuation of white matter, either small vessel disease or post treatment effect.  Widespread osseous disease throughout the calvarium representing metastatic lung cancer. There is destruction of the inner table of the skull from the LEFT parietal metastasis and the potential exists for extra-axial, intracranial extension of osseous tumor although none is present to any significant extent at this time.  There is permeative change in the clivus representing additional tumor. No demonstrable cavernous sinus mass, or bulging of the cavernous sinus. Negative orbits. Negative mastoids. Mucosal thickening in the sphenoid has progressed from priors.  IMPRESSION: Postsurgical changes LEFT frontal lobe. No intra-axial metastatic disease is observed.  Metastatic disease to the calvarium and skull base without demonstrable cavernous sinus or epidural tumor.   Electronically Signed   By: JStaci RighterM.D.   On: 06/15/2015  16:58    My personal review of EKG: sinus tach   Assessment & Plan  Principal Problem:   SIRS (systemic inflammatory response syndrome) Active Problems:   Bipolar disorder   Tachycardia   Hepatitis C   Hepatitis B   Osseous metastasis   Cancer associated pain   Palliative care encounter   Generalized anxiety disorder   Protein-calorie malnutrition, severe   GERD (gastroesophageal reflux disease)   Uremia   Metastatic cancer   Acute renal failure   SIRS Based on hypotension / tachycardia / confusion Likely due to uremia from Acute renal failure and hypovolemia. Will utilize Sepsis order set - checking blood cultures, lactic acid, pro calcitonin. Start antibiotics including imipenem per pharmacy and vancomycin 1 g 1 Received fluid resuscitation in the ER. We'll continue gentle IV fluids. Checking chest and abdominal xrays.  Had lengthy discussion with daughter ALorin Picket  She is aware that her mother has a high probability of dying, and that we will give full measures for 24 hours, but if there is no improvement - comfort care would likely be most beneficial for her.  If  she does  improve we can begin to add back her medications, continue aggressive treatment and plan for SNF placement at discharge. We appreciate Dr. Inda Castle Palliative Consultation and Recommendations.  Acute Renal Failure with Uremia  Likely to do poor PO intake coupled with medications / and mag citrate.   Will give fluids, strict I&Os May consider Renal U/S for obstruction if she is still anuric on 8/15.   Hold any nephrotoxic medications including Metformin, Benicar.  As well as sedating medications including Oxycontin, Oxycodone, and gabapentin.    Metastatic Lung Cancer Per Dr. Burr Medico, Oncologist, She is not candidate for chemotherapy. She has been involved in hospice. She is to see oncology only as needed. Dr. Annamaria Boots agreed to remain on boardas her hospice physician.  History of bipolar  disorder Cymbalta was discontinued by Dr. Burr Medico.  She is on low-dose Xanax at home. Will use low-dose Ativan when necessary inpatient.  Hard to discern what may be psychiatric illness versus uremia.  Anemia with Thrombocytopenia HGB is stable at 9.0.  Up from 7.5 on 7/26 (when she received a blood transfusion) Platelets are also stable at 19,000, up from 18,000 on 7/26.   Consultants Called:  Palliative medicine  Family Communication:   Spoke with daughter, [name]  Code Status:  DO NOT RESUSCITATE/DO NOT INTUBATE  Condition:  Poor prognosis  Potential Disposition: SNF versus [facility].  Time spent in minutes : [place],  PA-C on 07/04/2015 at 5:19 PM Between 7am to 7pm - Pager - [phone] After 7pm go to [email or web address] - password [email or web address] And look for the night coverage person covering me after hours  Attending  Patient was seen, examined,treatment plan was discussed with the Physician extender. I have directly reviewed the clinical findings, lab, imaging studies and management of this patient in detail. I have made the necessary changes to the above noted documentation, and agree with the documentation, as recorded by the Physician extender. 64 year old female with a history of metastatic lung cancer to the bone and pelvis, HTN, DM, GERD, anxiety/bipolar disorder, chronic pain, C. difficile, COPD, seizure presents with failure to thrive, oliguria, and confusion. The patient has had 6 admissions to the hospital and numerous ED visits in the past 6 months due to uncontrolled pain, confusion, and failure to thrive. The patient has been followed by palliative medicine. She is presently DO NOT RESUSCITATE but not comfort care. Upon presentation, the patient was encephalopathic and unable to provide any history. She was noted to be in acute kidney injury with serum creatinine 7.51. In addition, the patient was tachycardic and hypotensive with systolic blood pressure in the 70s. The  patient was seen by palliative medicine. Goals of care were discussed once again. The patient's daughter was tearful, stated that she was not ready for hospice.  The patient will be continued on intravenous fluids. Focused orders for sepsis were initiated including lactic acid, procalcitonin, blood cultures, and coags. The patient was started on IVF and empiric antibiotics--vanco and imipenem. In general, the patient's plan includes continued treatment with curative intent for the next 24-36 hours. If There is no improvement, consideration should be made to discuss full comfort care with the patient's daughter. Palliative medicine offered residential hospice to the patient's daughter, but the patient's daughter states that she would like the patient to go to a skilled nursing facility after this hospitalization.   Orson Eva, DO

## 2015-07-04 NOTE — Progress Notes (Signed)
ANTIBIOTIC CONSULT NOTE - INITIAL  Pharmacy Consult:  Primaxin Indication:  Sepsis  Allergies  Allergen Reactions  . Haldol [Haloperidol Decanoate] Swelling    tongue swelling  . Acetaminophen Other (See Comments)    Stomach pain  . Ibuprofen Other (See Comments)    Stomach pain  . Metronidazole Other (See Comments)    Severe stomach upset--was instructed to avoid Flagyl.  . Morphine And Related Hives    Tolerates hydromorphone.  Marland Kitchen Penicillins Rash    Patient Measurements: Weight: 116 lb 10 oz (52.9 kg)  Vital Signs: Temp: 99.2 F (37.3 C) (08/14 1353) Temp Source: Oral (08/14 1353) BP: 83/46 mmHg (08/14 1710) Pulse Rate: 106 (08/14 1645) Intake/Output from this shift: Total I/O In: 1000 [IV Piggyback:1000] Out: -   Labs:  Recent Labs  07/04/15 1440  WBC 4.7  HGB 9.2*  PLT 19*  CREATININE 7.51*   CrCl cannot be calculated (Unknown ideal weight.). No results for input(s): VANCOTROUGH, VANCOPEAK, VANCORANDOM, GENTTROUGH, GENTPEAK, GENTRANDOM, TOBRATROUGH, TOBRAPEAK, TOBRARND, AMIKACINPEAK, AMIKACINTROU, AMIKACIN in the last 72 hours.   Microbiology: Recent Results (from the past 720 hour(s))  Urine culture     Status: None   Collection Time: 06/15/15  3:08 PM  Result Value Ref Range Status   Specimen Description URINE, CATHETERIZED  Final   Special Requests NONE  Final   Culture   Final    NO GROWTH 2 DAYS Performed at [facility]    Report Status 06/17/2015 FINAL  Final    Medical History: Past Medical History  Diagnosis Date  . Hypertension   . Bipolar 1 disorder   . Heart murmur   . Sciatica   . Arthritis   . Anxiety   . Diverticulosis   . Chronic low back pain   . DDD (degenerative disc disease), lumbar   . Chronic shoulder pain   . Chronic neck pain   . Hepatitis B     Unclear when initially diagnosed, labs in Epic from 03/09/13  . Hepatitis C     Unclear when initially diagnosed, labs in Epic from 03/09/13  . C. difficile  diarrhea     April and February 2014  . COPD (chronic obstructive pulmonary disease)   . GERD (gastroesophageal reflux disease)   . H/O hiatal hernia   . Headache(784.0)     hx  . PONV (postoperative nausea and vomiting)   . Seizures     on meds  . Diabetes mellitus   . Adenocarcinoma of lung, stage 1   . GERD (gastroesophageal reflux disease)       Assessment: [name] [name] from hospice care center with AMS.  Pharmacy consulted to initiate vancomycin and Primaxin for sepsis.  Noted she has AKI.   Goal of Therapy:  Vancomycin trough level 15-20 mcg/ml   Plan:  - Vanc 1gm IV x 1, dose per level given AKI - Primaxin '125mg'$  IV Q6H - Monitor renal fxn, clinical progress, vanc level as indicated - F/U GoC, d/c heparin SQ given significant thrombocytopenia    Trevor Duty D. Mina Marble, PharmD, BCPS Pager:  [phone] 07/04/2015, 6:02 PM

## 2015-07-04 NOTE — Consult Note (Signed)
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 Patient well known to palliative service. Patient has had multiple hospitalizations at [facility] both in June as well as July 2016 for anemia, pain management. Towards the middle of July 2016, she was discharged home with hospice services under the care of her daughter [name] [name]. The patient has a past medical history significant for untreated hepatitis C, history of back pain status post lumbar spinal fusion in 2014. 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 She was hospitalized in April for intractable nausea vomiting and worsening pain. In May 2016 she received palliative radiation to her left pelvis. Her oncologist is [name] [name].  Patient presents for altered mental status. She has been brought into the hospital by her daughter. Patient has basically given up eating and drinking for the last 2-3 weeks. She has been having confusion and has on occasion also become combative. She has had falls. Workup in the emergency department reveals acute renal failure. Creatinine of 7. Hemoglobin is 9.2.  A palliative consultation was requested as the patient is currently enrolled with hospice of [facility]. The patient is confused but is able to state her name. She is in Trendelenburg posture on a stretcher in the emergency department. She is hypotensive with systolic blood pressure in the 60s-70s. She is being given wide open IV fluids at 999 mL per hour. Daughter [name] [name] is present at the bedside.  The patient's  daughter states that she would want to continue DO NOT RESUSCITATE/DO NOT INTUBATE. However no other limits on care or desire. Extensive discussions with the patient's daughter about the patient's underlying cancer as well as acute decline suggesting multisystem organ failure and the fact that the patient is possibly transitioning towards end-of-life. Patient's daughter is tearful and states that she is not ready for hospice.  Discussed with emergency room physicians as well as hospitalist provider. Palliative service will follow along. Discussed with the patient's daughter about unintended side effects/consequences of life maintaining/prolonging measures such as IV fluids could cause volume overload respiratory distress. Explained to the patient's daughter that the patient's hemoglobin is 9.2 and that would not qualify currently for indication for blood transfusion at this point.  Patient's daughter states that she would like for the patient to go to a skilled nursing facility after this hospitalization. Frankly discussed that the patient possibly has a prognosis of hours-days and is very likely to have a hospital that. Also discussed that transfer to hospice facility should the patient were to survive this hospitalization would be most medically reasonable and appropriate instead of a nursing facility at this point in time.  Offered extensive support to the patient's daughter who is struggling. Discussed various measures and interventions including comfort care only. Palliative services will follow along and help guide appropriate decision-making. Appreciate hospitalist input.  Contacts/Participants in Discussion: Primary Decision Maker: Daughter [name] [name] as patient is confused and not decisional currently HCPOA: yes  Daughter [name] [name]  Code Status/Advance Care Planning:  DO NOT RESUSCITATE/DO NOT INTUBATE  Symptom Management:   No acute needs for pain management currently. Continue to  follow.  Palliative Prophylaxis: To be monitored.  Additional Recommendations (Limitations, Scope, Preferences):  As discussed with daughter, admission to hospitalist service, time-limited trial of prolonging/maintaining measures such as IV fluids etc. Discussed extensively. will follow along. Psycho-social/Spiritual:   Support System: Yes, daughter  Desire for further Chaplaincy support:no  Prognosis: Hours - Days  Discharge Planning:  Pending hospitalization course. It is possible that the patient has hours-days and will have a hospital tests daughter is aware.   Domains of Care: - Physical: No acute complaints of pain - Psychological: Confused - Social: Lives at home with daughter [name] [name] was recently connected with hospice and palliative care of [facility] after hospitalization in matter of July 2016. - Spiritual: No acute issues noted in current encounter - Cultural: As above - Imminently dying: Possibly - Ethical/Legal: CODE STATUS is DO NOT RESUSCITATE/DO NOT INTUBATE. Daughter [name] [name] is healthcare power of attorney  Values: To continue some gentle life maintaining/life-prolonging treatments for now. Goals are not comfort only at this point. Life limiting illness: Lung cancer, metastases to bone      Chief Complaint/History of Present Illness: Weakness confusion and lack of oral intake  Primary Diagnoses  Present on Admission:  . Uremia Acute renal failure Lung cancer metastatic to bone  Palliative Review of Systems: Completed I have reviewed the medical record, interviewed the patient and family, and examined the patient. The following aspects are pertinent.  Past Medical History  Diagnosis Date  . Hypertension   . Bipolar 1 disorder   . Heart murmur   . Sciatica   . Arthritis   . Anxiety   . Diverticulosis   . Chronic low back pain   . DDD (degenerative disc disease), lumbar   . Chronic shoulder pain   . Chronic neck pain   . Hepatitis B      Unclear when initially diagnosed, labs in Epic from 03/09/13  . Hepatitis C     Unclear when initially diagnosed, labs in Epic from 03/09/13  . C. difficile diarrhea     April and February 2014  . COPD (chronic obstructive pulmonary disease)   . GERD (gastroesophageal reflux disease)   . H/O hiatal hernia   . Headache(784.0)     hx  . PONV (postoperative nausea and vomiting)   . Seizures     on meds  . Diabetes mellitus   . Adenocarcinoma of lung, stage 1   . GERD (gastroesophageal reflux disease)    Social History   Social History  . Marital Status: Widowed    Spouse Name: N/A  . Number of Children: N/A  . Years of Education: N/A   Occupational History  . n/a     patient draws SNN/SSI   Social History Main Topics  . Smoking status: Former Smoker -- 0.25 packs/day for 10 years    Types: Cigarettes    Quit date: 07/07/2013  . Smokeless tobacco: Never Used     Comment: Smokes pk q 3 days   marajuna aug  . Alcohol Use: No     Comment: quit 44yr ago  . Drug Use: Yes    Special: Hydrocodone, Marijuana     Comment: "maybe about once a month"  . Sexual Activity: No   Other Topics Concern  . None   Social History Narrative   Family History  Problem Relation Age of Onset  . Hypertension Father     deceased  . Diabetes Father   . Heart disease Father   . Hyperlipidemia Father   .  Hypertension Mother   . Hyperlipidemia Mother   . Diabetes Mother   . Hypertension Sister   . Hyperlipidemia Sister   . Hypertension Brother   . Hyperlipidemia Brother    Scheduled Meds: Continuous Infusions: PRN Meds:. Medications Prior to Admission:  Prior to Admission medications   Medication Sig Start Date End Date Taking? Authorizing Provider  ALPRAZolam Duanne Moron) 1 MG tablet Take 0.5 tablets (0.5 mg total) by mouth daily. 05/20/15   Truitt Merle, MD  dicyclomine (BENTYL) 20 MG tablet Take 20 mg by mouth 3 (three) times daily with meals.    Historical Provider, MD  DULoxetine  (CYMBALTA) 30 MG capsule Take 1 capsule (30 mg total) by mouth daily. Patient not taking: Reported on 06/18/2015 05/06/15   Modena Jansky, MD  feeding supplement, ENSURE ENLIVE, (ENSURE ENLIVE) LIQD Take 237 mLs by mouth 3 (three) times daily between meals. 06/06/15   Christina P Rama, MD  feeding supplement, RESOURCE BREEZE, (RESOURCE BREEZE) LIQD Take 1 Container by mouth 3 (three) times daily between meals. 05/06/15   Modena Jansky, MD  gabapentin (NEURONTIN) 400 MG capsule Take 400 mg by mouth 3 (three) times daily. Nerve pain    Historical Provider, MD  Ipratropium-Albuterol (COMBIVENT RESPIMAT) 20-100 MCG/ACT AERS respimat Inhale 2 puffs into the lungs daily as needed for wheezing or shortness of breath.     Historical Provider, MD  loratadine (CLARITIN) 10 MG tablet Take 10 mg by mouth every morning.     Historical Provider, MD  magnesium hydroxide (MILK OF MAGNESIA) 400 MG/5ML suspension Take 5 mLs by mouth daily as needed for mild constipation. 03/24/15   Costin Karlyne Greenspan, MD  Menthol-Methyl Salicylate (MUSCLE RUB) 10-15 % CREA Apply 1 application topically daily as needed for muscle pain. Left shoulder pain    Historical Provider, MD  metFORMIN (GLUCOPHAGE) 500 MG tablet Take 1 tablet by mouth 2 (two) times daily. 03/30/15   Historical Provider, MD  metoCLOPramide (REGLAN) 5 MG tablet Take 1 tablet (5 mg total) by mouth 3 (three) times daily before meals. 03/12/15   Oswald Hillock, MD  metoprolol tartrate (LOPRESSOR) 25 MG tablet Take 25 mg by mouth daily. 05/24/15   Historical Provider, MD  olmesartan (BENICAR) 40 MG tablet Take 1 tablet (40 mg total) by mouth daily. 05/06/15   Modena Jansky, MD  omeprazole (PRILOSEC) 20 MG capsule Take 1 capsule (20 mg total) by mouth 2 (two) times daily before a meal. 03/26/15   Tyler Pita, MD  ondansetron (ZOFRAN-ODT) 8 MG disintegrating tablet Take 1 tablet (8 mg total) by mouth every 6 (six) hours as needed for nausea or vomiting. 04/28/15   Truitt Merle, MD   OXcarbazepine (TRILEPTAL) 150 MG tablet Take 150 mg by mouth 3 (three) times daily.  11/24/13   Historical Provider, MD  OxyCODONE (OXYCONTIN) 20 mg T12A 12 hr tablet Take 3 tablets (60 mg total) by mouth 3 (three) times daily. 06/06/15   Venetia Maxon Rama, MD  Oxycodone HCl 10 MG TABS Take 1-2 tablets (10-20 mg total) by mouth every 4 (four) hours as needed (moderate pain, severe pain, breakthrough pain). 05/20/15   Truitt Merle, MD  polyethylene glycol (MIRALAX / Floria Raveling) packet Take 17 g by mouth 2 (two) times daily. 05/06/15   Modena Jansky, MD  senna-docusate (SENOKOT-S) 8.6-50 MG per tablet Take 2 tablets by mouth 2 (two) times daily. 05/06/15   Modena Jansky, MD  simvastatin (ZOCOR) 20 MG tablet Take 20 mg by mouth  at bedtime.     Historical Provider, MD  Vitamin D, Ergocalciferol, (DRISDOL) 50000 UNITS CAPS capsule Take 50,000 Units by mouth every Monday.     Historical Provider, MD   Allergies  Allergen Reactions  . Haldol [Haloperidol Decanoate] Other (See Comments)    tongue swelling  . Acetaminophen Other (See Comments)    Stomach pain  . Ibuprofen Other (See Comments)    Stomach pain  . Metronidazole Other (See Comments)    Severe stomach upset--was instructed to avoid Flagyl.  . Morphine And Related Hives    Tolerates hydromorphone.  Marland Kitchen Penicillins Rash   CBC:    Component Value Date/Time   WBC 4.7 07/04/2015 1440   WBC 4.9 05/20/2015 1523   HGB 9.2* 07/04/2015 1440   HGB 7.1* 05/20/2015 1523   HCT 28.3* 07/04/2015 1440   HCT 22.7* 05/20/2015 1523   PLT 19* 07/04/2015 1440   PLT 26* 05/20/2015 1523   MCV 87.9 07/04/2015 1440   MCV 91.9 05/20/2015 1523   NEUTROABS 2.7 06/15/2015 1519   NEUTROABS 3.4 05/20/2015 1523   LYMPHSABS 1.0 06/15/2015 1519   LYMPHSABS 1.0 05/20/2015 1523   MONOABS 0.4 06/15/2015 1519   MONOABS 0.5 05/20/2015 1523   EOSABS 0.0 06/15/2015 1519   EOSABS 0.0 05/20/2015 1523   BASOSABS 0.0 06/15/2015 1519   BASOSABS 0.0 05/20/2015 1523    Comprehensive Metabolic Panel:    Component Value Date/Time   NA 135 07/04/2015 1440   NA 138 05/20/2015 1524   K 4.3 07/04/2015 1440   K 4.1 05/20/2015 1524   CL 90* 07/04/2015 1440   CO2 26 07/04/2015 1440   CO2 28 05/20/2015 1524   BUN 117* 07/04/2015 1440   BUN 25.6 05/20/2015 1524   CREATININE 7.51* 07/04/2015 1440   CREATININE 1.0 05/20/2015 1524   GLUCOSE 99 07/04/2015 1440   GLUCOSE 105 05/20/2015 1524   CALCIUM 8.9 07/04/2015 1440   CALCIUM 9.1 05/20/2015 1524   AST 40 07/04/2015 1440   AST 16 05/20/2015 1524   ALT 17 07/04/2015 1440   ALT 9 05/20/2015 1524   ALKPHOS 340* 07/04/2015 1440   ALKPHOS 531* 05/20/2015 1524   BILITOT 1.1 07/04/2015 1440   BILITOT 0.36 05/20/2015 1524   PROT 5.9* 07/04/2015 1440   PROT 5.8* 05/20/2015 1524   ALBUMIN 2.9* 07/04/2015 1440   ALBUMIN 2.9* 05/20/2015 1524    Physical Exam: Vital Signs: BP 77/46 mmHg  Pulse 107  Temp(Src) 99.2 F (37.3 C) (Oral)  Resp 19  SpO2 95% SpO2: SpO2: 95 % O2 Device: O2 Device: Not Delivered O2 Flow Rate:   Intake/output summary:  Intake/Output Summary (Last 24 hours) at 07/04/15 1654 Last data filed at 07/04/15 1634  Gross per 24 hour  Intake   1000 ml  Output      0 ml  Net   1000 ml   LBM:   patient's daughter states that the patient has had constipation secondary to opioid medications. Date of last bowel movement unknown. Baseline Weight:   Most recent weight:    Exam Findings:  Weak frail African-American lady in Trendelenburg posture on stretcher in the emergency department Confuse Shallow breathing S1-S2 Extremities warm and dry no edema Abdomen soft mild distention                       Palliative Performance Scale: 10% Additional Data Reviewed: Recent Labs     07/04/15  1440  WBC  4.7  HGB  9.2*  PLT  19*  NA  135  BUN  117*  CREATININE  7.51*     Time In: 1400 Time Out: 1500 Time Total: 60 min  Greater than 50%  of this time was spent counseling and  coordinating care related to the above assessment and plan.  Signed by: Loistine Chance, MD [place], MD  07/04/2015, 4:54 PM  Please contact Palliative Medicine Team phone at [phone] for questions and concerns.

## 2015-07-04 NOTE — ED Notes (Signed)
Dr. Steinl at bedside 

## 2015-07-04 NOTE — ED Provider Notes (Signed)
Patient seen/examined in the Emergency Department in conjunction with Resident Physician Provider  Patient presents for altered mental status.  She has h/o metastatic cancer.   Exam : awake but confused, she will intermittently speak on exam.  She is hypotensive Plan: I had long discussion with daughter and palliative care.  Pt is currently on hospice.  Daughter is currently determining further care goals.  Pt will be admitted to hospitalist    Ripley Fraise, MD 07/04/15 1558

## 2015-07-04 NOTE — ED Notes (Signed)
Placed pt on 4 L nasal cannula.

## 2015-07-04 NOTE — ED Notes (Signed)
Palliative care at bedside.

## 2015-07-04 NOTE — ED Notes (Signed)
Placed pt in trendelenburg.

## 2015-07-04 NOTE — ED Provider Notes (Signed)
CSN: [id number]     Arrival date & time 07/04/15  1340 History   First MD Initiated Contact with Patient 07/04/15 1506     Chief Complaint  Patient presents with  . Altered Mental Status     (Consider location/radiation/quality/duration/timing/severity/associated sxs/prior Treatment) HPI  [name] a 64 y.o. female with past medical history of metastatic cancer, hypertension, seizures who presents today with altered mental status. Daughter is present at bedside as she relates that she's noticed a significant change in patient's mental status over the last 3 days. She states that the patient is currently in home hospice and she is unsure if she will certainly remain there. She states that prior to 3 days ago mother was more coherent and able to carry on a conversation that she is right now. States patient in the past has been like this and she's had to have blood transfusions. At this point patient is disoriented and unable to provide reliable history. She does endorse that she is not having any pain. She does notably have lower blood pressures with initial blood pressure being 75/40 on arrival. Daughter denies any new rash or cough or fever at home.   Past Medical History  Diagnosis Date  . Hypertension   . Bipolar 1 disorder   . Heart murmur   . Sciatica   . Arthritis   . Anxiety   . Diverticulosis   . Chronic low back pain   . DDD (degenerative disc disease), lumbar   . Chronic shoulder pain   . Chronic neck pain   . Hepatitis B     Unclear when initially diagnosed, labs in Epic from 03/09/13  . Hepatitis C     Unclear when initially diagnosed, labs in Epic from 03/09/13  . C. difficile diarrhea     April and February 2014  . COPD (chronic obstructive pulmonary disease)   . GERD (gastroesophageal reflux disease)   . H/O hiatal hernia   . Headache(784.0)     hx  . PONV (postoperative nausea and vomiting)   . Seizures     on meds  . Diabetes mellitus   . Adenocarcinoma of  lung, stage 1   . GERD (gastroesophageal reflux disease)    Past Surgical History  Procedure Laterality Date  . Facial tumor removal Right 2000  . Brain tumor removal  09  . Brain surgery      in [place]  . Video bronchoscopy  12/04/2012    Procedure: VIDEO BRONCHOSCOPY;  Surgeon: Grace Isaac, MD;  Location: [facility] OR;  Service: Thoracic;  Laterality: N/A;  . Video assisted thoracoscopy (vats)/wedge resection  12/04/2012    Procedure: VIDEO ASSISTED THORACOSCOPY (VATS)/WEDGE RESECTION;  Surgeon: Grace Isaac, MD;  Location: [place];  Service: Thoracic;  Laterality: Right;  . Lobectomy  12/04/2012    Procedure: LOBECTOMY;  Surgeon: Grace Isaac, MD;  Location: [place];  Service: Thoracic;  Laterality: Right;  completion of right upper lobectomy and lymph node disection, placement of on q pump  . Tubal ligation    . Tonsillectomy    . Anterior cervical decomp/discectomy fusion N/A 03/05/2014    Procedure: ANTERIOR CERVICAL DECOMPRESSION/DISCECTOMY FUSION 2 LEVELS;  Surgeon: Sinclair Ship, MD;  Location: [place];  Service: Orthopedics;  Laterality: N/A;  Anterior cervical decompression fusion, cervical 4-5, cervical 5-6 with instrumentation, allograft.   Family History  Problem Relation Age of Onset  . Hypertension Father     deceased  . Diabetes Father   .  Heart disease Father   . Hyperlipidemia Father   . Hypertension Mother   . Hyperlipidemia Mother   . Diabetes Mother   . Hypertension Sister   . Hyperlipidemia Sister   . Hypertension Brother   . Hyperlipidemia Brother    Social History  Substance Use Topics  . Smoking status: Former Smoker -- 0.25 packs/day for 10 years    Types: Cigarettes    Quit date: 07/07/2013  . Smokeless tobacco: Never Used     Comment: Smokes pk q 3 days   marajuna aug  . Alcohol Use: No     Comment: quit 72yr ago   OB History    No data available     Review of Systems  Unable to perform ROS: Mental status change   Constitutional: Negative for fever and chills.  Respiratory: Negative for shortness of breath.   Gastrointestinal: Negative for vomiting (daughter denies) and blood in stool (daughter denies).  Skin: Negative for rash (daughter denies any new rash).  Psychiatric/Behavioral: Positive for confusion (worsening over last three days, related by daughter). Negative for agitation.      Allergies  Haldol; Acetaminophen; Ibuprofen; Metronidazole; Morphine and related; and Penicillins  Home Medications   Prior to Admission medications   Medication Sig Start Date End Date Taking? Authorizing Provider  ALPRAZolam (Duanne Moron 1 MG tablet Take 0.5 tablets (0.5 mg total) by mouth daily. Patient taking differently: Take 1 mg by mouth daily. 0.5 mg BID or 1 mg QD. 05/20/15  Yes YTruitt Merle MD  dicyclomine (BENTYL) 20 MG tablet Take 20 mg by mouth 3 (three) times daily with meals.   Yes Historical Provider, MD  DULoxetine (CYMBALTA) 30 MG capsule Take 1 capsule (30 mg total) by mouth daily. 05/06/15  Yes AModena Jansky MD  feeding supplement, ENSURE ENLIVE, (ENSURE ENLIVE) LIQD Take 237 mLs by mouth 3 (three) times daily between meals. Patient taking differently: Take 237 mLs by mouth daily as needed (nutritonal supplement).  06/06/15  Yes Christina P Rama, MD  gabapentin (NEURONTIN) 400 MG capsule Take 400 mg by mouth 3 (three) times daily. Nerve pain   Yes Historical Provider, MD  magnesium citrate SOLN Take 0.5 Bottles by mouth daily as needed for mild constipation or severe constipation.   Yes Historical Provider, MD  metFORMIN (GLUCOPHAGE) 500 MG tablet Take 1 tablet by mouth 2 (two) times daily. 03/30/15  Yes Historical Provider, MD  metoCLOPramide (REGLAN) 5 MG tablet Take 1 tablet (5 mg total) by mouth 3 (three) times daily before meals. 03/12/15  Yes GOswald Hillock MD  metoprolol tartrate (LOPRESSOR) 25 MG tablet Take 25 mg by mouth daily. 05/24/15  Yes Historical Provider, MD  olmesartan (BENICAR) 40 MG  tablet Take 1 tablet (40 mg total) by mouth daily. 05/06/15  Yes AModena Jansky MD  ondansetron (ZOFRAN-ODT) 8 MG disintegrating tablet Take 1 tablet (8 mg total) by mouth every 6 (six) hours as needed for nausea or vomiting. Patient taking differently: Take 8 mg by mouth daily as needed for nausea or vomiting.  04/28/15  Yes YTruitt Merle MD  OXcarbazepine (TRILEPTAL) 150 MG tablet Take 150 mg by mouth 2 (two) times daily.  11/24/13  Yes Historical Provider, MD  OxyCODONE (OXYCONTIN) 20 mg T12A 12 hr tablet Take 3 tablets (60 mg total) by mouth 3 (three) times daily. Patient taking differently: Take 40-60 mg by mouth 3 (three) times daily.  06/06/15  Yes CVenetia MaxonRama, MD  Oxycodone HCl 10 MG TABS Take  1-2 tablets (10-20 mg total) by mouth every 4 (four) hours as needed (moderate pain, severe pain, breakthrough pain). 05/20/15  Yes Truitt Merle, MD  OXYCONTIN 60 MG T12A Take 1 tablet by mouth daily as needed (for severe pain).  06/25/15  Yes Historical Provider, MD  Sennosides-Docusate Sodium (SENNA S PO) Take 2 tablets by mouth 2 (two) times daily. 06/21/15  Yes Historical Provider, MD  simvastatin (ZOCOR) 20 MG tablet Take 20 mg by mouth at bedtime.    Yes Historical Provider, MD  tiZANidine (ZANAFLEX) 4 MG tablet Take 4 mg by mouth 2 (two) times daily.   Yes Historical Provider, MD  magnesium hydroxide (MILK OF MAGNESIA) 400 MG/5ML suspension Take 5 mLs by mouth daily as needed for mild constipation. Patient not taking: Reported on 07/04/2015 03/24/15   Caren Griffins, MD  Menthol-Methyl Salicylate (MUSCLE RUB) 10-15 % CREA Apply 1 application topically daily as needed for muscle pain. Left shoulder pain    Historical Provider, MD  omeprazole (PRILOSEC) 20 MG capsule Take 1 capsule (20 mg total) by mouth 2 (two) times daily before a meal. 03/26/15   Tyler Pita, MD   BP 113/67 mmHg  Pulse 98  Temp(Src) 98.1 F (36.7 C) (Oral)  Resp 18  Wt 116 lb 10 oz (52.9 kg)  SpO2 92% Physical Exam   Constitutional: She has a sickly appearance. No distress.  Elderly and frail appearing  HENT:  Head: Normocephalic and atraumatic.  Eyes: Pupils are equal, round, and reactive to light. Right eye exhibits no discharge. Left eye exhibits no discharge.  Neck: Normal range of motion. Neck supple.  Cardiovascular: Tachycardia present.   Pulmonary/Chest: Effort normal and breath sounds normal.  Abdominal: Soft. She exhibits no distension. There is no tenderness.  Musculoskeletal: Normal range of motion. She exhibits no tenderness.  Neurological: She is alert. She is disoriented (to time, oriented to person and place). GCS eye subscore is 3. GCS verbal subscore is 4. GCS motor subscore is 6.  Skin: Skin is warm and dry. She is not diaphoretic.  Nursing note and vitals reviewed.   ED Course  Procedures (including critical care time) Labs Review Labs Reviewed  COMPREHENSIVE METABOLIC PANEL - Abnormal; Notable for the following:    Chloride 90 (*)    BUN 117 (*)    Creatinine, Ser 7.51 (*)    Total Protein 5.9 (*)    Albumin 2.9 (*)    Alkaline Phosphatase 340 (*)    GFR calc non Af Amer 5 (*)    GFR calc Af Amer 6 (*)    Anion gap 19 (*)    All other components within normal limits  CBC - Abnormal; Notable for the following:    RBC 3.22 (*)    Hemoglobin 9.2 (*)    HCT 28.3 (*)    RDW 18.0 (*)    Platelets 19 (*)    All other components within normal limits  URINALYSIS, ROUTINE W REFLEX MICROSCOPIC (NOT AT [facility]) - Abnormal; Notable for the following:    Color, Urine RED (*)    APPearance CLOUDY (*)    Bilirubin Urine MODERATE (*)    Ketones, ur 15 (*)    Protein, ur 30 (*)    Leukocytes, UA SMALL (*)    All other components within normal limits  PROTIME-INR - Abnormal; Notable for the following:    Prothrombin Time 15.9 (*)    All other components within normal limits  URINE MICROSCOPIC-ADD ON - Abnormal; Notable  for the following:    Squamous Epithelial / LPF FEW (*)     Bacteria, UA FEW (*)    Casts HYALINE CASTS (*)    All other components within normal limits  CK - Abnormal; Notable for the following:    Total CK 782 (*)    All other components within normal limits  CBG MONITORING, ED - Abnormal; Notable for the following:    Glucose-Capillary 118 (*)    All other components within normal limits  CULTURE, BLOOD (ROUTINE X 2)  CULTURE, BLOOD (ROUTINE X 2)  LACTIC ACID, PLASMA  PROCALCITONIN  APTT  BASIC METABOLIC PANEL  CBC  CORTISOL  CBG MONITORING, ED  TYPE AND SCREEN    Imaging Review Dg Abd Acute W/chest  07/04/2015   CLINICAL DATA:  Abdominal pain.  EXAM: DG ABDOMEN ACUTE W/ 1V CHEST  COMPARISON:  Chest x-ray dated 06/02/2015 and CT scan of the abdomen dated 04/30/2015  FINDINGS: The patient has new diffuse interstitial accentuation with Kerley B-lines at the right lung base. Overall heart size and pulmonary vascularity are normal. Power port in place. No effusions. No free air or free fluid in the abdomen. Bowel gas pattern is normal. There is moderate stool in the colon including in the rectum.  The patient has known diffuse osseous metastatic disease. No pathologic fractures.  IMPRESSION: 1. New diffuse interstitial accentuation with Kerley B-lines at the right base. The findings are consistent with interstitial edema which may be noncardiac in origin. 2. Extensive stool in the colon without obstructive signs.   Electronically Signed   By: Lorriane Shire M.D.   On: 07/04/2015 20:06   I, Theodosia Quay, personally reviewed and evaluated these images and lab results as part of my medical decision-making.   EKG Interpretation   Date/Time:  Sunday July 04 2015 14:04:57 EDT Ventricular Rate:  146 PR Interval:  132 QRS Duration: 66 QT Interval:  270 QTC Calculation: 420 R Axis:   92 Text Interpretation:  Sinus tachycardia Rightward axis Abnormal ekg  Confirmed by Christy Gentles  MD, Elenore Rota ([id number]) on 07/04/2015 3:14:14 PM      MDM   Final  diagnoses:  Uremia  Metastatic cancer  Altered mental status, unspecified altered mental status type    [name] a 64 y.o. female with past medical history of metastatic cancer, hypertension, seizures who presents today with altered mental status. Altered mental status likely a result of uremia. Patient notably has an elevated creatinine significantly higher than baseline. No obvious source of infection today. Patient remains hypotensive and tachycardic as well. Source of uremia and creatinine elevation can possibly be prerenal etiology. Patient actively receiving IV fluids. Hemoglobin is not to the point of needing transfusion at this point and likely not to be the culprit driving patient's altered mental status today. Discussed with daughter goals of care at this point and she relates that she no longer wants patient to be home hospice. Palliative care was consult it and saw the patient here in the emergency department. Discussed with internal medicine who will admit the patient for further palliation. Daughter agreeable with this plan. Patient was critically ill the time of transfer.    Theodosia Quay, MD 07/05/15 7741  Ripley Fraise, MD 07/05/15 [phone]

## 2015-07-04 NOTE — ED Notes (Signed)
[name] hospice care for cancer.  Patient with decreased intake for the past 1 week.  She is diabetic.  [name] more confused and weak.  Patient fell when coming into the ED and hit head on the car.  Patient unable to stand, legs give out.  She is able to answer questions.  She denies pain.

## 2015-07-04 NOTE — ED Notes (Signed)
Notified Dr Ashok Cordia of pt BP.  Requested fluids.  To see before giving orders.

## 2015-07-05 DIAGNOSIS — G9340 Encephalopathy, unspecified: Secondary | ICD-10-CM

## 2015-07-05 LAB — BASIC METABOLIC PANEL
ANION GAP: 13 (ref 5–15)
BUN: 107 mg/dL — ABNORMAL HIGH (ref 6–20)
CHLORIDE: 98 mmol/L — AB (ref 101–111)
CO2: 25 mmol/L (ref 22–32)
Calcium: 8.7 mg/dL — ABNORMAL LOW (ref 8.9–10.3)
Creatinine, Ser: 4.29 mg/dL — ABNORMAL HIGH (ref 0.44–1.00)
GFR calc Af Amer: 11 mL/min — ABNORMAL LOW (ref >60)
GFR, EST NON AFRICAN AMERICAN: 10 mL/min — AB (ref >60)
Glucose, Bld: 82 mg/dL (ref 65–99)
POTASSIUM: 3.6 mmol/L (ref 3.5–5.1)
SODIUM: 136 mmol/L (ref 135–145)

## 2015-07-05 LAB — CBC
HCT: 23.9 % — ABNORMAL LOW (ref 36.0–46.0)
HEMOGLOBIN: 7.6 g/dL — AB (ref 12.0–15.0)
MCH: 28.3 pg (ref 26.0–34.0)
MCHC: 31.8 g/dL (ref 30.0–36.0)
MCV: 88.8 fL (ref 78.0–100.0)
PLATELETS: 18 10*3/uL — AB (ref 150–400)
RBC: 2.69 MIL/uL — AB (ref 3.87–5.11)
RDW: 18.4 % — ABNORMAL HIGH (ref 11.5–15.5)
WBC: 4.6 10*3/uL (ref 4.0–10.5)

## 2015-07-05 LAB — CORTISOL: CORTISOL PLASMA: 13.6 ug/dL

## 2015-07-05 LAB — PROCALCITONIN: Procalcitonin: 0.33 ng/mL

## 2015-07-05 MED ORDER — FLEET ENEMA 7-19 GM/118ML RE ENEM
1.0000 | ENEMA | Freq: Once | RECTAL | Status: AC
  Administered 2015-07-05: 1 via RECTAL
  Filled 2015-07-05: qty 1

## 2015-07-05 MED ORDER — SODIUM CHLORIDE 0.9 % IJ SOLN
10.0000 mL | INTRAMUSCULAR | Status: DC | PRN
  Administered 2015-07-05: 10 mL
  Filled 2015-07-05: qty 40

## 2015-07-05 NOTE — Progress Notes (Signed)
[facility] PROGRESS NOTE  [name] [name] HRC:[id number] DOB: [birth year] DOA: 07/04/2015 PCP: Harvie Junior, MD   Assessment & Plan  1. Altered mental status- resolved, likely from dehydration. Stop the antibiotic since patient is nontoxic. No source of infection, UA is clear, chest x-ray does not show pneumonia. 2. Acute kidney injury- patient came with worsening renal function with BUN and creatinine 117/7.51, started on IV fluids and today 107/4.29. Continue gentle IV hydration. 3. Anemia- patient has a history of anemia, today hemoglobin is 7.6, dropped from 9.2 yesterday. Will check CBC in a.m. and if hemoglobin less than 7 will transfuse PRBC. 4. Thrombocytopenia- patient is a history of chronic thrombus cytopenia, platelets are stable at 18,000. No active bleeding at this time. Follow CBC in a.m. 5. Metastatic lung cancer- per oncology patient is not a candidate for chemotherapy. Family has report hospice. 6. History of bipolar disorder- stable, continue when necessary Ativan.    Code Status: DO NOT RESUSCITATE Family Communication: Discussed with the grandson at bedside Disposition Plan: Skilled nursing facility   Consultants:  Palliative care  Procedures:  None  Antibiotics:  Vancomycin- stopped 07/05/2015  Primaxin- stopped 07/05/2015  HPI/Subjective: 64 y.o. female, who has primary lung cancer with osseous metastasis, unable to tolerate chemotherapy due to poor functional status, diabetes, seizures, history of hepatitis B and hepatitis C. She is on Hospice. She was brought to the emergency department today by her daughter for weakness, decreased oral intake, and anuria. Her daughter reports that the patient has been constipated for the last several weeks due to opiate medications. For the last 1 week she has had decreased urination and in the last 3 or 4 days has not urinated at all. In an attempt to create a bowel movement her daughter has been giving the  patient mag citrate. She's had multiple falls in the last 2 weeks. She has become confused. This morning patient is alert and communicating well. Denies any pain.  Objective: Filed Vitals:   07/05/15 0830  BP: 105/62  Pulse: 90  Temp: 98.2 F (36.8 C)  Resp: 17    Intake/Output Summary (Last 24 hours) at 07/05/15 1541 Last data filed at 07/05/15 1509  Gross per 24 hour  Intake   1360 ml  Output    650 ml  Net    710 ml   Filed Weights   07/04/15 1716  Weight: 52.9 kg (116 lb 10 oz)    Exam:   General:  Appears in no acute distress  Cardiovascular: *S1-S2 regular  Respiratory: Clear to auscultation bilaterally  Abdomen: Soft, nontender, no organomegaly  Musculoskeletal: No edema of the lower extremities   Data Reviewed: Basic Metabolic Panel:  Recent Labs Lab 07/04/15 1440 07/05/15 0600  NA 135 136  K 4.3 3.6  CL 90* 98*  CO2 26 25  GLUCOSE 99 82  BUN 117* 107*  CREATININE 7.51* 4.29*  CALCIUM 8.9 8.7*   Liver Function Tests:  Recent Labs Lab 07/04/15 1440  AST 40  ALT 17  ALKPHOS 340*  BILITOT 1.1  PROT 5.9*  ALBUMIN 2.9*   No results for input(s): LIPASE, AMYLASE in the last 168 hours. No results for input(s): AMMONIA in the last 168 hours. CBC:  Recent Labs Lab 07/04/15 1440 07/05/15 0600  WBC 4.7 4.6  HGB 9.2* 7.6*  HCT 28.3* 23.9*  MCV 87.9 88.8  PLT 19* 18*   Cardiac Enzymes:  Recent Labs Lab 07/04/15 2100  CKTOTAL 782*   BNP (last 3 results)  No results for input(s): BNP in the last 8760 hours.  ProBNP (last 3 results) No results for input(s): PROBNP in the last 8760 hours.  CBG:  Recent Labs Lab 07/04/15 1357  GLUCAP 118*    Recent Results (from the past 240 hour(s))  Culture, blood (routine x 2)     Status: None (Preliminary result)   Collection Time: 07/04/15  9:58 PM  Result Value Ref Range Status   Specimen Description BLOOD LEFT HAND  Final   Special Requests IN PEDIATRIC BOTTLE 3CC  Final   Culture  NO GROWTH < 24 HOURS  Final   Report Status PENDING  Incomplete  Culture, blood (routine x 2)     Status: None (Preliminary result)   Collection Time: 07/04/15 10:14 PM  Result Value Ref Range Status   Specimen Description BLOOD RIGHT HAND  Final   Special Requests IN PEDIATRIC BOTTLE 1CC  Final   Culture NO GROWTH < 24 HOURS  Final   Report Status PENDING  Incomplete     Studies: Dg Abd Acute W/chest  07/04/2015   CLINICAL DATA:  Abdominal pain.  EXAM: DG ABDOMEN ACUTE W/ 1V CHEST  COMPARISON:  Chest x-ray dated 06/02/2015 and CT scan of the abdomen dated 04/30/2015  FINDINGS: The patient has new diffuse interstitial accentuation with Kerley B-lines at the right lung base. Overall heart size and pulmonary vascularity are normal. Power port in place. No effusions. No free air or free fluid in the abdomen. Bowel gas pattern is normal. There is moderate stool in the colon including in the rectum.  The patient has known diffuse osseous metastatic disease. No pathologic fractures.  IMPRESSION: 1. New diffuse interstitial accentuation with Kerley B-lines at the right base. The findings are consistent with interstitial edema which may be noncardiac in origin. 2. Extensive stool in the colon without obstructive signs.   Electronically Signed   By: Lorriane Shire M.D.   On: 07/04/2015 20:06    Scheduled Meds:  Continuous Infusions: . sodium chloride 75 mL/hr at 07/04/15 1815    Principal Problem:   SIRS (systemic inflammatory response syndrome) Active Problems:   Bipolar disorder   Tachycardia   Hepatitis C   Hepatitis B   Osseous metastasis   Cancer associated pain   Palliative care encounter   Generalized anxiety disorder   Protein-calorie malnutrition, severe   GERD (gastroesophageal reflux disease)   Uremia   Metastatic cancer   Acute renal failure   Acute encephalopathy    Time spent: *25 min    [facility] Pager [phone]. If 7PM-7AM, please contact  night-coverage at [email or web address], password [facility] 07/05/2015, 3:41 PM  LOS: 1 day   [facility] -  Office  [phone]

## 2015-07-05 NOTE — Care Management Note (Signed)
Case Management Note  Patient Details  Name: [name] [name] MRN: [id number] Date of Birth: 1951/04/29 Effective 07/05/15 Subjective/Objective:    64 y.o. F who was in Hospice care for Metastatic Lung Cancer. Daughter has since Revoked Hospice Care effective 07/05/15. Pt was confused, weak, anuric and has been deteriorating with multiple falls and decreased po intake x 2 weeks.  Pt is currently receiving IVF and should receive PRBCs on 07/06/15. Has been unable to tolerate Chemo. Will continue to follow for disposition after recovers from AKI.    Daughter is  wanting more aggressive treatment for her mother.           Action/Plan: Will continue to follow.    Expected Discharge Date:                  Expected Discharge Plan:     In-House Referral:  Clinical Social Work  Discharge planning Services  CM Consult  Post Acute Care Choice:    Choice offered to:     DME Arranged:    DME Agency:     HH Arranged:    HH Agency:     Status of Service:  In process, will continue to follow  Medicare Important Message Given:    Date Medicare IM Given:    Medicare IM give by:    Date Additional Medicare IM Given:    Additional Medicare Important Message give by:     If discussed at [place] of Stay Meetings, dates discussed:    Additional Comments:  Delrae Sawyers, RN 07/05/2015, 4:03 PM

## 2015-07-05 NOTE — Progress Notes (Signed)
Home Care SW made visit this morning with Patient and her Yolanda Bonine, and Daughter.  SW had talked to Daughter [name] who stated she was just not sure they wanted to continue Hospice services at this time.  SW did talk to her privately and she stated she wants to make sure her mother can live for as long as she can and with her doing better today, she feels she needs to be able to get her mother any treatment she may need.  She talked openly about her frustrations and how she just did not feel Hospice was necessary at this time.  SW normalized feelings and let her know that it was Patient and family choice to keep Hospice and she signed the Revocation paperwork and discharge instruction sheet.  SW did talk to her about other options and she voiced appreciation.  Revocation will be effective today July 05, 2015.  Cecilio Asper, [facility] [place]

## 2015-07-05 NOTE — Progress Notes (Addendum)
General Inpatient RN visit. [name] at [facility] on [address].  [name] [name] is a Hospice and Palliative Care patient that was admitted for AMS, decreased PO intake and weakness. This is a related and covered admission to her [facility] DX. of Lung Cancer. Patient's code status is a Full Code.  Patient sitting on bsc when writer entered room. Pt's nephew is at the bedside. Patient came in yesterday with an additional c/o of constipation and staff reports pt. was recently given an enema. Patient reports she is much better than yesterday. She denies pain. Her speech is clear and she answers questions appropriately. Respirations regular and unlabored. Madara Shillinglaw [facility] LCSW present in the hallway waiting for pt's daughter to arrive.  Transfer summary and medication list placed on the chart. Please call with any questions.  [facility] Liaison. [phone]

## 2015-07-06 ENCOUNTER — Telehealth: Payer: Self-pay | Admitting: *Deleted

## 2015-07-06 LAB — BASIC METABOLIC PANEL
ANION GAP: 11 (ref 5–15)
BUN: 53 mg/dL — ABNORMAL HIGH (ref 6–20)
CALCIUM: 8.9 mg/dL (ref 8.9–10.3)
CO2: 27 mmol/L (ref 22–32)
Chloride: 107 mmol/L (ref 101–111)
Creatinine, Ser: 0.67 mg/dL (ref 0.44–1.00)
Glucose, Bld: 179 mg/dL — ABNORMAL HIGH (ref 65–99)
Potassium: 3 mmol/L — ABNORMAL LOW (ref 3.5–5.1)
Sodium: 145 mmol/L (ref 135–145)

## 2015-07-06 LAB — CBC
HCT: 26.3 % — ABNORMAL LOW (ref 36.0–46.0)
Hemoglobin: 8.5 g/dL — ABNORMAL LOW (ref 12.0–15.0)
MCH: 28.8 pg (ref 26.0–34.0)
MCHC: 32.3 g/dL (ref 30.0–36.0)
MCV: 89.2 fL (ref 78.0–100.0)
PLATELETS: 21 10*3/uL — AB (ref 150–400)
RBC: 2.95 MIL/uL — ABNORMAL LOW (ref 3.87–5.11)
RDW: 18.3 % — AB (ref 11.5–15.5)
WBC: 5.7 10*3/uL (ref 4.0–10.5)

## 2015-07-06 MED ORDER — OXYCODONE-ACETAMINOPHEN 5-325 MG PO TABS
2.0000 | ORAL_TABLET | Freq: Once | ORAL | Status: AC
  Administered 2015-07-06: 2 via ORAL
  Filled 2015-07-06: qty 2

## 2015-07-06 MED ORDER — BOOST / RESOURCE BREEZE PO LIQD
1.0000 | Freq: Three times a day (TID) | ORAL | Status: DC
  Administered 2015-07-06 – 2015-07-07 (×3): 1 via ORAL

## 2015-07-06 MED ORDER — SENNOSIDES-DOCUSATE SODIUM 8.6-50 MG PO TABS
1.0000 | ORAL_TABLET | Freq: Two times a day (BID) | ORAL | Status: DC
  Administered 2015-07-06 – 2015-07-07 (×2): 1 via ORAL
  Filled 2015-07-06 (×2): qty 1

## 2015-07-06 NOTE — Progress Notes (Signed)
[facility] PROGRESS NOTE  [name] [name] BTD:[id number] DOB: Jun 25, 1951 DOA: 07/04/2015 PCP: Harvie Junior, MD   Assessment & Plan  1. Altered mental status- resolved, likely from dehydration. Stopped the antibiotic since patient is nontoxic. No source of infection, UA  clear, chest x-ray did not show pneumonia. 2. Acute kidney injury- patient came with worsening renal function with BUN and creatinine 117/7.51, started on IV fluids and improved to 107/4.29.Labs for today are pending, follow the BMP 3. Anemia- patient has a history of anemia, today hemoglobin is 7.6, dropped from 9.2 yesterday. Will check CBC in a.m. and if hemoglobin less than 7 will transfuse PRBC. Follow cbc 4. Thrombocytopenia- patient is a history of chronic thrombus cytopenia, platelets are stable at 18,000. No active bleeding at this time. Follow CBC  5. Metastatic lung cancer- per oncology patient is not a candidate for chemotherapy. Family has revoked hospice. 6. History of bipolar disorder- stable, continue when necessary Ativan.    Code Status: DO NOT RESUSCITATE Family Communication:  No family at bedside Disposition Plan: Skilled nursing facility   Consultants:  Palliative care  Procedures:  None  Antibiotics:  Vancomycin- stopped 07/05/2015  Primaxin- stopped 07/05/2015  HPI/Subjective: 64 y.o. female, who has primary lung cancer with osseous metastasis, unable to tolerate chemotherapy due to poor functional status, diabetes, seizures, history of hepatitis B and hepatitis C. She is on Hospice. She was brought to the emergency department today by her daughter for weakness, decreased oral intake, and anuria. Her daughter reports that the patient has been constipated for the last several weeks due to opiate medications. For the last 1 week she has had decreased urination and in the last 3 or 4 days has not urinated at all. In an attempt to create a bowel movement her daughter has been  giving the patient mag citrate. She's had multiple falls in the last 2 weeks. She has become confused.  Patient refused lab draws this morning. Now agreeable to get blood draw.  Objective: Filed Vitals:   07/06/15 1016  BP: 162/90  Pulse: 112  Temp: 98 F (36.7 C)  Resp: 18    Intake/Output Summary (Last 24 hours) at 07/06/15 1328 Last data filed at 07/06/15 1017  Gross per 24 hour  Intake    540 ml  Output   1701 ml  Net  -1161 ml   Filed Weights   07/04/15 1716 07/05/15 2035  Weight: 52.9 kg (116 lb 10 oz) 50.984 kg (112 lb 6.4 oz)    Exam:   General:  Appears in no acute distress  Cardiovascular: S1-S2 regular  Respiratory: Clear to auscultation bilaterally  Abdomen: Soft, nontender, no organomegaly  Musculoskeletal: No edema of the lower extremities   Data Reviewed: Basic Metabolic Panel:  Recent Labs Lab 07/04/15 1440 07/05/15 0600  NA 135 136  K 4.3 3.6  CL 90* 98*  CO2 26 25  GLUCOSE 99 82  BUN 117* 107*  CREATININE 7.51* 4.29*  CALCIUM 8.9 8.7*   Liver Function Tests:  Recent Labs Lab 07/04/15 1440  AST 40  ALT 17  ALKPHOS 340*  BILITOT 1.1  PROT 5.9*  ALBUMIN 2.9*   No results for input(s): LIPASE, AMYLASE in the last 168 hours. No results for input(s): AMMONIA in the last 168 hours. CBC:  Recent Labs Lab 07/04/15 1440 07/05/15 0600  WBC 4.7 4.6  HGB 9.2* 7.6*  HCT 28.3* 23.9*  MCV 87.9 88.8  PLT 19* 18*   Cardiac Enzymes:  Recent Labs  Lab 07/04/15 2100  CKTOTAL 782*   BNP (last 3 results) No results for input(s): BNP in the last 8760 hours.  ProBNP (last 3 results) No results for input(s): PROBNP in the last 8760 hours.  CBG:  Recent Labs Lab 07/04/15 1357  GLUCAP 118*    Recent Results (from the past 240 hour(s))  Culture, blood (routine x 2)     Status: None (Preliminary result)   Collection Time: 07/04/15  9:58 PM  Result Value Ref Range Status   Specimen Description BLOOD LEFT HAND  Final    Special Requests IN PEDIATRIC BOTTLE 3CC  Final   Culture NO GROWTH 2 DAYS  Final   Report Status PENDING  Incomplete  Culture, blood (routine x 2)     Status: None (Preliminary result)   Collection Time: 07/04/15 10:14 PM  Result Value Ref Range Status   Specimen Description BLOOD RIGHT HAND  Final   Special Requests IN PEDIATRIC BOTTLE 1CC  Final   Culture NO GROWTH 2 DAYS  Final   Report Status PENDING  Incomplete     Studies: Dg Abd Acute W/chest  07/04/2015   CLINICAL DATA:  Abdominal pain.  EXAM: DG ABDOMEN ACUTE W/ 1V CHEST  COMPARISON:  Chest x-ray dated 06/02/2015 and CT scan of the abdomen dated 04/30/2015  FINDINGS: The patient has new diffuse interstitial accentuation with Kerley B-lines at the right lung base. Overall heart size and pulmonary vascularity are normal. Power port in place. No effusions. No free air or free fluid in the abdomen. Bowel gas pattern is normal. There is moderate stool in the colon including in the rectum.  The patient has known diffuse osseous metastatic disease. No pathologic fractures.  IMPRESSION: 1. New diffuse interstitial accentuation with Kerley B-lines at the right base. The findings are consistent with interstitial edema which may be noncardiac in origin. 2. Extensive stool in the colon without obstructive signs.   Electronically Signed   By: Lorriane Shire M.D.   On: 07/04/2015 20:06    Scheduled Meds:  Continuous Infusions: . sodium chloride 75 mL/hr at 07/04/15 1815    Principal Problem:   SIRS (systemic inflammatory response syndrome) Active Problems:   Bipolar disorder   Tachycardia   Hepatitis C   Hepatitis B   Osseous metastasis   Cancer associated pain   Palliative care encounter   Generalized anxiety disorder   Protein-calorie malnutrition, severe   GERD (gastroesophageal reflux disease)   Uremia   Metastatic cancer   Acute renal failure   Acute encephalopathy    Time spent: *25 min    [facility] Pager [phone]. If 7PM-7AM, please contact night-coverage at [email or web address], password [facility] 07/06/2015, 1:28 PM  LOS: 2 days   [facility] -  Office  [phone]

## 2015-07-06 NOTE — Progress Notes (Signed)
Initial Nutrition Assessment  DOCUMENTATION CODES:   Severe malnutrition in context of chronic illness  INTERVENTION:   Provide Boost Breeze po TID, each supplement provides 250 kcal and 9 grams of protein.  Encourage adequate PO intake.   NUTRITION DIAGNOSIS:   Malnutrition related to chronic illness as evidenced by percent weight loss, severe depletion of body fat, severe depletion of muscle mass.  GOAL:   Patient will meet greater than or equal to 90% of their needs  MONITOR:   PO intake, Supplement acceptance, Diet advancement, Weight trends, Labs, I & O's  REASON FOR ASSESSMENT:   Malnutrition Screening Tool    ASSESSMENT:   64 y.o. female, who has primary lung cancer with osseous metastasis, unable to tolerate chemotherapy due to poor functional status, diabetes, seizures, history of hepatitis B and hepatitis C. She was brought to the emergency department today by her daughter for weakness, decreased oral intake, and anuria.  Meal completion has been varied from 0-50%. Pt reports her appetite is fine. Pt reports eating fine at home, however per MD note, daughter reports pt having a decreased oral intake. No family at bedside during time of visit. Pt reports usual body weight of ~140 lbs. Per Epic weight records, pt with a 13.8% weight loss in 3 months. Pt reports consuming Resource Breeze at home and would like the receive them while hospitalized. RD to order. Pt was encourage to consume her food at meals.   Nutrition-Focused physical exam completed. Findings are severe fat depletion, moderate to severe muscle depletion, and no edema.   Labs and medications reviewed.  Diet Order:  Diet full liquid Room service appropriate?: Yes; Fluid consistency:: Thin  Skin:  Reviewed, no issues  Last BM:  PTA  Height:   Ht Readings from Last 1 Encounters:  06/18/15 '5\' 4"'$  (1.626 m)    Weight:   Wt Readings from Last 1 Encounters:  07/05/15 112 lb 6.4 oz (50.984 kg)     Ideal Body Weight:  54.5 kg  BMI:  Body mass index is 19.28 kg/(m^2).  Estimated Nutritional Needs:   Kcal:  1700-1900  Protein:  75-90 grams  Fluid:  1.7 - 1.9 L/day  EDUCATION NEEDS:   No education needs identified at this time  Corrin Parker, MS, RD, LDN Pager # [phone] After hours/ weekend pager # [phone]

## 2015-07-06 NOTE — Telephone Encounter (Signed)
Received vm call from Patty Beard/Hospice/[place] stating that pt is currently in [facility] & pt & daughter have decided to revoke Hospice services.  She has been d/c from hospice services as of yest.  Note to Dr Burr Medico.

## 2015-07-07 ENCOUNTER — Telehealth: Payer: Self-pay | Admitting: *Deleted

## 2015-07-07 MED ORDER — OXYCODONE HCL ER 20 MG PO T12A: 20.0000 mg | EXTENDED_RELEASE_TABLET | Freq: Two times a day (BID) | ORAL | Status: DC

## 2015-07-07 MED ORDER — BOOST / RESOURCE BREEZE PO LIQD: 1.0000 | Freq: Three times a day (TID) | ORAL | Status: AC

## 2015-07-07 MED ORDER — ALPRAZOLAM 0.25 MG PO TABS: 0.2500 mg | ORAL_TABLET | Freq: Every evening | ORAL | Status: AC | PRN

## 2015-07-07 MED ORDER — OXYCODONE HCL 10 MG PO TABS: 10.0000 mg | ORAL_TABLET | ORAL | Status: DC | PRN

## 2015-07-07 MED ORDER — POTASSIUM CHLORIDE CRYS ER 20 MEQ PO TBCR
40.0000 meq | EXTENDED_RELEASE_TABLET | Freq: Once | ORAL | Status: AC
  Administered 2015-07-07: 40 meq via ORAL
  Filled 2015-07-07: qty 2

## 2015-07-07 MED ORDER — OXYCODONE HCL ER 10 MG PO T12A
60.0000 mg | EXTENDED_RELEASE_TABLET | Freq: Three times a day (TID) | ORAL | Status: DC
  Administered 2015-07-07 (×2): 60 mg via ORAL
  Filled 2015-07-07 (×2): qty 6

## 2015-07-07 NOTE — Discharge Summary (Signed)
Physician Discharge Summary  [name] [name] UTM:[id number] DOB: 03-24-1951 DOA: 07/04/2015  PCP: Harvie Junior, MD  Admit date: 07/04/2015 Discharge date: 07/07/2015  Time spent: > 35 minutes  Recommendations for Outpatient Follow-up:  1. Reassess potassium levels 2. Monitor and continue to treat pain  3. Discontinued some of her pain medications as patient had AMS most likely 2ary to polypharmacy 4. Control diabetes with diet  Discharge Diagnoses:  Principal Problem:   SIRS (systemic inflammatory response syndrome) Active Problems:   Bipolar disorder   Tachycardia   Hepatitis C   Hepatitis B   Osseous metastasis   Cancer associated pain   Palliative care encounter   Generalized anxiety disorder   Protein-calorie malnutrition, severe   GERD (gastroesophageal reflux disease)   Uremia   Metastatic cancer   Acute renal failure   Acute encephalopathy   Discharge Condition: stable  Diet recommendation: diabetic diet  Filed Weights   07/04/15 1716 07/05/15 2035  Weight: 52.9 kg (116 lb 10 oz) 50.984 kg (112 lb 6.4 oz)    History of present illness:  From original HPI: 64 y.o. female, who has primary lung cancer with osseous metastasis, unable to tolerate chemotherapy due to poor functional status, diabetes, seizures, history of hepatitis B and hepatitis C. She is on Hospice.  She presented with AKI and AMS  Hospital Course:  AMS - improved after hydration, improvement in kidney function, and discontinuation of sedating medication  Tachycardia - may be multifactorial, currently sinus. Could be rebound from discontinuation of B blocker or could be from discontinuation of xanax. - Will continue home b blocker regimen on d/c and xanax on d/c  DM - recommend diet control given regimen elevation in Serum creatinine - Discontinued metformin  HTN - held ARB due to recent AKI - Continue beta blocker discharge  Hypokalemia - replace orally prior to d/c  Chronic  pain - Secondary to neoplastic process. Decrease pain medication regimen. We'll send her home on oxycodone 12 hour and OxyIR. Otherwise discontinue gabapentin and muscle relaxer  Please see orders for details regarding other comorbidities  Given resolution of acute kidney injury will continue trileptal  Procedures:  None  Consultations:  Palliative medicine  Discharge Exam: Filed Vitals:   07/07/15 0952  BP: 169/104  Pulse: 131  Temp: 98.2 F (36.8 C)  Resp: 18    General: Pt in nad, alert and awake Cardiovascular: rrr, no rubs Respiratory: CTA BL, no wheezes  Discharge Instructions   Discharge Instructions    Call MD for:  difficulty breathing, headache or visual disturbances    Complete by:  As directed      Call MD for:  temperature >100.4    Complete by:  As directed      Diet - low sodium heart healthy    Complete by:  As directed      Discharge instructions    Complete by:  As directed   Recommend patient follow up with her oncologist for further evaluation and recommendations.     Increase activity slowly    Complete by:  As directed           Current Discharge Medication List    CONTINUE these medications which have CHANGED   Details  ALPRAZolam (XANAX) 0.25 MG tablet Take 1 tablet (0.25 mg total) by mouth at bedtime as needed for anxiety. Qty: 10 tablet, Refills: 0    feeding supplement (BOOST / RESOURCE BREEZE) LIQD Take 1 Container by mouth 3 (three) times daily between  meals. Refills: 0    OxyCODONE (OXYCONTIN) 20 mg T12A 12 hr tablet Take 1 tablet (20 mg total) by mouth every 12 (twelve) hours. Qty: 30 tablet, Refills: 0    Oxycodone HCl 10 MG TABS Take 1 tablet (10 mg total) by mouth every 4 (four) hours as needed (moderate pain, severe pain, breakthrough pain). Qty: 30 tablet, Refills: 0   Associated Diagnoses: Malignant neoplasm of upper lobe of lung, unspecified laterality      CONTINUE these medications which have NOT CHANGED    Details  dicyclomine (BENTYL) 20 MG tablet Take 20 mg by mouth 3 (three) times daily with meals.    DULoxetine (CYMBALTA) 30 MG capsule Take 1 capsule (30 mg total) by mouth daily. Qty: 30 capsule, Refills: 0    metoCLOPramide (REGLAN) 5 MG tablet Take 1 tablet (5 mg total) by mouth 3 (three) times daily before meals. Qty: 30 tablet, Refills: 2    metoprolol tartrate (LOPRESSOR) 25 MG tablet Take 25 mg by mouth daily.    ondansetron (ZOFRAN-ODT) 8 MG disintegrating tablet Take 1 tablet (8 mg total) by mouth every 6 (six) hours as needed for nausea or vomiting. Qty: 20 tablet, Refills: 2    OXcarbazepine (TRILEPTAL) 150 MG tablet Take 150 mg by mouth 2 (two) times daily.     Sennosides-Docusate Sodium (SENNA S PO) Take 2 tablets by mouth 2 (two) times daily. Refills: 1    simvastatin (ZOCOR) 20 MG tablet Take 20 mg by mouth at bedtime.     omeprazole (PRILOSEC) 20 MG capsule Take 1 capsule (20 mg total) by mouth 2 (two) times daily before a meal. Qty: 60 capsule, Refills: 5   Associated Diagnoses: Metastatic cancer to bone; Gastritis and gastroduodenitis; Tinea corporis      STOP taking these medications     gabapentin (NEURONTIN) 400 MG capsule      magnesium citrate SOLN      metFORMIN (GLUCOPHAGE) 500 MG tablet      olmesartan (BENICAR) 40 MG tablet      tiZANidine (ZANAFLEX) 4 MG tablet      magnesium hydroxide (MILK OF MAGNESIA) 400 MG/5ML suspension      Menthol-Methyl Salicylate (MUSCLE RUB) 10-15 % CREA        Allergies  Allergen Reactions  . Haldol [Haloperidol Decanoate] Swelling    tongue swelling  . Acetaminophen Other (See Comments)    Stomach pain  . Ibuprofen Other (See Comments)    Stomach pain  . Metronidazole Other (See Comments)    Severe stomach upset--was instructed to avoid Flagyl.  . Morphine And Related Hives    Tolerates hydromorphone.  Marland Kitchen Penicillins Rash      The results of significant diagnostics from this hospitalization  (including imaging, microbiology, ancillary and laboratory) are listed below for reference.    Significant Diagnostic Studies: Ct Head W Wo Contrast  06/15/2015   CLINICAL DATA:  Confusion, new onset of gaze abnormality. Diplopia. Lung cancer with history of previous brain resection.  EXAM: CT HEAD WITHOUT AND WITH CONTRAST  TECHNIQUE: Contiguous axial images were obtained from the base of the skull through the vertex without and with intravenous contrast  CONTRAST:  20m OMNIPAQUE IOHEXOL 300 MG/ML  SOLN  COMPARISON:  06/02/2015 CT head. MR head 02/26/2015. PET scan 04/01/2015.  FINDINGS: No evidence for acute infarction, hemorrhage, mass lesion, hydrocephalus, or extra-axial fluid. Encephalomalacia LEFT frontal region related to resection of prior brain tumor.  Post infusion, no abnormal enhancement of the brain or  meninges.  Overall normal cerebral volume. Hypoattenuation of white matter, either small vessel disease or post treatment effect.  Widespread osseous disease throughout the calvarium representing metastatic lung cancer. There is destruction of the inner table of the skull from the LEFT parietal metastasis and the potential exists for extra-axial, intracranial extension of osseous tumor although none is present to any significant extent at this time.  There is permeative change in the clivus representing additional tumor. No demonstrable cavernous sinus mass, or bulging of the cavernous sinus. Negative orbits. Negative mastoids. Mucosal thickening in the sphenoid has progressed from priors.  IMPRESSION: Postsurgical changes LEFT frontal lobe. No intra-axial metastatic disease is observed.  Metastatic disease to the calvarium and skull base without demonstrable cavernous sinus or epidural tumor.   Electronically Signed   By: Staci Righter M.D.   On: 06/15/2015 16:58   Dg Abd Acute W/chest  07/04/2015   CLINICAL DATA:  Abdominal pain.  EXAM: DG ABDOMEN ACUTE W/ 1V CHEST  COMPARISON:  Chest x-ray  dated 06/02/2015 and CT scan of the abdomen dated 04/30/2015  FINDINGS: The patient has new diffuse interstitial accentuation with Kerley B-lines at the right lung base. Overall heart size and pulmonary vascularity are normal. Power port in place. No effusions. No free air or free fluid in the abdomen. Bowel gas pattern is normal. There is moderate stool in the colon including in the rectum.  The patient has known diffuse osseous metastatic disease. No pathologic fractures.  IMPRESSION: 1. New diffuse interstitial accentuation with Kerley B-lines at the right base. The findings are consistent with interstitial edema which may be noncardiac in origin. 2. Extensive stool in the colon without obstructive signs.   Electronically Signed   By: Lorriane Shire M.D.   On: 07/04/2015 20:06    Microbiology: Recent Results (from the past 240 hour(s))  Culture, blood (routine x 2)     Status: None (Preliminary result)   Collection Time: 07/04/15  9:58 PM  Result Value Ref Range Status   Specimen Description BLOOD LEFT HAND  Final   Special Requests IN PEDIATRIC BOTTLE 3CC  Final   Culture NO GROWTH 3 DAYS  Final   Report Status PENDING  Incomplete  Culture, blood (routine x 2)     Status: None (Preliminary result)   Collection Time: 07/04/15 10:14 PM  Result Value Ref Range Status   Specimen Description BLOOD RIGHT HAND  Final   Special Requests IN PEDIATRIC BOTTLE 1CC  Final   Culture NO GROWTH 3 DAYS  Final   Report Status PENDING  Incomplete     Labs: Basic Metabolic Panel:  Recent Labs Lab 07/04/15 1440 07/05/15 0600 07/06/15 1810  NA 135 136 145  K 4.3 3.6 3.0*  CL 90* 98* 107  CO2 '26 25 27  '$ GLUCOSE 99 82 179*  BUN 117* 107* 53*  CREATININE 7.51* 4.29* 0.67  CALCIUM 8.9 8.7* 8.9   Liver Function Tests:  Recent Labs Lab 07/04/15 1440  AST 40  ALT 17  ALKPHOS 340*  BILITOT 1.1  PROT 5.9*  ALBUMIN 2.9*   No results for input(s): LIPASE, AMYLASE in the last 168 hours. No  results for input(s): AMMONIA in the last 168 hours. CBC:  Recent Labs Lab 07/04/15 1440 07/05/15 0600 07/06/15 1505  WBC 4.7 4.6 5.7  HGB 9.2* 7.6* 8.5*  HCT 28.3* 23.9* 26.3*  MCV 87.9 88.8 89.2  PLT 19* 18* 21*   Cardiac Enzymes:  Recent Labs Lab 07/04/15 2100  CKTOTAL  782*   BNP: BNP (last 3 results) No results for input(s): BNP in the last 8760 hours.  ProBNP (last 3 results) No results for input(s): PROBNP in the last 8760 hours.  CBG:  Recent Labs Lab 07/04/15 1357  GLUCAP 118*       Signed:  Velvet Bathe  [facility] 07/07/2015, 2:30 PM

## 2015-07-07 NOTE — Progress Notes (Signed)
Patient Discharge: Disposition: Patient discharged to home with daughter with home health. Education: Reviewed medications, prescriptions, discharge instructions and daughter understood and acknowledged. IV: Porta cath deaccessed by the IV team. Telemetry: Discontinued before discharge.  CCMD notified. Transportation: Patient transported in w/c with the daughter and staff accompanying her. Belongings: Patient took all her belongings with her.

## 2015-07-07 NOTE — Care Management Note (Signed)
Case Management Note  Patient Details  Name: [name] [name] MRN: [id number] Date of Birth: 21-Jul-1951  Subjective/Objective:     Lung cancer with mets               Action/Plan: Home   Expected Discharge Date:  07/07/2015               Expected Discharge Plan:  Home/Self Care  In-House Referral:  Clinical Social Work  Discharge planning Services  CM Consult   Status of Service:  Completed, signed off  Medicare Important Message Given:    Date Medicare IM Given:    Medicare IM give by:    Date Additional Medicare IM Given:    Additional Medicare Important Message give by:     If discussed at [place] of Stay Meetings, dates discussed:    Additional Comments: NCM spoke to pt and gave permission to speak to dtr, Freda Munro. States at this time she does not want any services with Hospice. Peterson with RN. States they want to take pt home and discuss for a few days what the plan will be long-term. They are not sure if they want SNF for long term care. Explained PCP can arrange [place] from his office or continued services with Hospice.  Erenest Rasher, RN 07/07/2015, 3:40 PM

## 2015-07-07 NOTE — Progress Notes (Signed)
Pt with 4 episodes of emesis this am. Each episode totaling 50 - 100 ml. Administration of PRN Zofran as ordered. Pty reports she was taking zofran at home regularly to prevent the nausea and vomiting. Will continue to monitor for future episodes. Dorthey Sawyer, RN

## 2015-07-07 NOTE — Clinical Social Work Note (Signed)
CSW talked with patient's daughter Kellyann Ordway about discharge planning and facility placement for her mother at discharge. Ms. Alamillo explained why she revoked Hospice services for her mother and indicated that she plans to take her mother home and care for her as this is what her mother wants. Patient was awake but did not participate in the conversation. Ms. Sakuma was given a skilled facility list and CSW talked with her about the process of getting her mother into a skilled facility if the plans change once her mother is at home.   Dalaya Suppa Givens, MSW, LCSW Licensed Clinical Social Worker [place] [phone]

## 2015-07-07 NOTE — Progress Notes (Signed)
Pt with complaints of severe abdominal and generalized discomfort 10/10. Daughter at bedside with concern as to when and if patients home medications would be re-initiated.  Explained to patient and daughter the initial rational for holding patients home medication regimen due to her confusion and acute kidney injury. Patient and daughter both verbalize clear understanding. Will report off in the AM for attending physician to re-eval. Page to K. Schorr for notification with new orders placed for one time dose percocet 5/325 two tabs p.o. Will continue to monitor, Laurance Flatten, Roberts Gaudy, RN

## 2015-07-07 NOTE — Plan of Care (Signed)
Problem: Phase III Progression Outcomes Goal: IV/normal saline lock discontinued Outcome: Not Applicable Date Met:  11/00/34 Porta cath access, IV team paged

## 2015-07-07 NOTE — Telephone Encounter (Signed)
Received call from daughter [name] re:  Pt had revoked Hospice services.  Both pt and Andoria did not think pt's pain was managed correctly.  Andoria wanted to know if pt should need a f/u appt with Dr. Burr Medico or need another Hospice services.  Noted that pt is currently admitted to the hospital. Andoria's  Phone    [phone].

## 2015-07-08 ENCOUNTER — Ambulatory Visit: Payer: Self-pay | Admitting: Oncology

## 2015-07-08 NOTE — Telephone Encounter (Signed)
Please contact the hospice program in [facility] to see if they can take her. Thanks.  Krista Blue

## 2015-07-09 ENCOUNTER — Telehealth: Payer: Self-pay | Admitting: *Deleted

## 2015-07-09 ENCOUNTER — Telehealth: Payer: Self-pay | Admitting: Hematology

## 2015-07-09 LAB — CULTURE, BLOOD (ROUTINE X 2)
CULTURE: NO GROWTH
CULTURE: NO GROWTH

## 2015-07-09 NOTE — Telephone Encounter (Signed)
Lft msg for pt's daughter [name] for apt with MD ASAP per 08/19 POF, I scheduled pt for 08/22 '@9'$ :30 advised her to call to confirm and also if not to get another date.... KJ

## 2015-07-09 NOTE — Telephone Encounter (Signed)
[facility] @ [phone] this am & spoke with Manus Gunning, referral given.  They will review & should be able to see pt.  Attempted to call daughter but received vm & message left regarding above & req. Call back if she had questions or concerns.

## 2015-07-09 NOTE — Telephone Encounter (Signed)
NOTIFIED DR.FENG'S NURSE, MYRTLE HARDIN,RN THAT PT.'S DAUGHTER WOULD LIKE A RETURN CALL FROM DR.FRNG'S NURSE. MYRTLE WILL CALL PT.'S DAUGHTER.

## 2015-07-09 NOTE — Telephone Encounter (Signed)
Called & spoke with daugter, [name] to discuss hospice referral to [place].  She feels like Hospice is a label & that the pt is treated differently when people find out she is in hospice.  She states that she understands that her mother has a terminal illness but they still want to prolong life.  She felt like the doctors didn't know the pt & they made changes with meds that didn't work.  Discussed with Dr Burr Medico & decided that pt & daughter need to come in to discuss to make further decisions.  [name] is OK with this.  POF to schedulers to make appt.

## 2015-07-12 ENCOUNTER — Encounter: Payer: Self-pay | Admitting: Hematology

## 2015-07-12 ENCOUNTER — Ambulatory Visit (HOSPITAL_BASED_OUTPATIENT_CLINIC_OR_DEPARTMENT_OTHER): Payer: Medicare Other | Admitting: Hematology

## 2015-07-12 ENCOUNTER — Ambulatory Visit (HOSPITAL_COMMUNITY)
Admission: RE | Admit: 2015-07-12 | Discharge: 2015-07-12 | Disposition: A | Discharge status: Home / Self Care | Payer: Medicare Other | Source: Ambulatory Visit | Attending: Hematology | Admitting: Hematology

## 2015-07-12 ENCOUNTER — Ambulatory Visit (HOSPITAL_BASED_OUTPATIENT_CLINIC_OR_DEPARTMENT_OTHER): Payer: Medicare Other

## 2015-07-12 ENCOUNTER — Other Ambulatory Visit: Payer: Self-pay | Admitting: *Deleted

## 2015-07-12 ENCOUNTER — Telehealth: Payer: Self-pay | Admitting: Hematology

## 2015-07-12 VITALS — BP 107/49 | HR 114 | Temp 98.3°F | Resp 14

## 2015-07-12 VITALS — BP 94/49 | HR 104 | Temp 98.4°F | Resp 16 | Ht 64.0 in | Wt 104.3 lb

## 2015-07-12 DIAGNOSIS — C3411 Malignant neoplasm of upper lobe, right bronchus or lung: Secondary | ICD-10-CM

## 2015-07-12 DIAGNOSIS — C7951 Secondary malignant neoplasm of bone: Secondary | ICD-10-CM | POA: Diagnosis not present

## 2015-07-12 DIAGNOSIS — C3410 Malignant neoplasm of upper lobe, unspecified bronchus or lung: Secondary | ICD-10-CM

## 2015-07-12 DIAGNOSIS — D649 Anemia, unspecified: Secondary | ICD-10-CM | POA: Diagnosis not present

## 2015-07-12 DIAGNOSIS — D61818 Other pancytopenia: Secondary | ICD-10-CM

## 2015-07-12 DIAGNOSIS — C3490 Malignant neoplasm of unspecified part of unspecified bronchus or lung: Secondary | ICD-10-CM

## 2015-07-12 DIAGNOSIS — R630 Anorexia: Secondary | ICD-10-CM | POA: Diagnosis not present

## 2015-07-12 DIAGNOSIS — G893 Neoplasm related pain (acute) (chronic): Secondary | ICD-10-CM

## 2015-07-12 LAB — CBC WITH DIFFERENTIAL/PLATELET
BASO%: 1.1 % (ref 0.0–2.0)
Basophils Absolute: 0.1 10*3/uL (ref 0.0–0.1)
EOS%: 0.7 % (ref 0.0–7.0)
Eosinophils Absolute: 0 10*3/uL (ref 0.0–0.5)
HCT: 23.3 % — ABNORMAL LOW (ref 34.8–46.6)
HEMOGLOBIN: 7.3 g/dL — AB (ref 11.6–15.9)
LYMPH#: 1 10*3/uL (ref 0.9–3.3)
LYMPH%: 22.6 % (ref 14.0–49.7)
MCH: 28.2 pg (ref 25.1–34.0)
MCHC: 31.3 g/dL — ABNORMAL LOW (ref 31.5–36.0)
MCV: 90 fL (ref 79.5–101.0)
MONO#: 0.4 10*3/uL (ref 0.1–0.9)
MONO%: 9.2 % (ref 0.0–14.0)
NEUT#: 3 10*3/uL (ref 1.5–6.5)
NEUT%: 66.4 % (ref 38.4–76.8)
NRBC: 9 % — AB (ref 0–0)
Platelets: 13 10*3/uL — ABNORMAL LOW (ref 145–400)
RBC: 2.59 10*6/uL — ABNORMAL LOW (ref 3.70–5.45)
RDW: 18.8 % — AB (ref 11.2–14.5)
WBC: 4.6 10*3/uL (ref 3.9–10.3)

## 2015-07-12 LAB — COMPREHENSIVE METABOLIC PANEL (CC13)
ALT: 13 U/L (ref 0–55)
AST: 26 U/L (ref 5–34)
Albumin: 2.9 g/dL — ABNORMAL LOW (ref 3.5–5.0)
Alkaline Phosphatase: 302 U/L — ABNORMAL HIGH (ref 40–150)
Anion Gap: 13 mEq/L — ABNORMAL HIGH (ref 3–11)
BILIRUBIN TOTAL: 0.55 mg/dL (ref 0.20–1.20)
BUN: 62 mg/dL — ABNORMAL HIGH (ref 7.0–26.0)
CHLORIDE: 99 meq/L (ref 98–109)
CO2: 28 meq/L (ref 22–29)
Calcium: 9.3 mg/dL (ref 8.4–10.4)
Creatinine: 2.2 mg/dL — ABNORMAL HIGH (ref 0.6–1.1)
EGFR: 27 mL/min/{1.73_m2} — AB (ref >90)
GLUCOSE: 97 mg/dL (ref 70–140)
Potassium: 4.2 mEq/L (ref 3.5–5.1)
SODIUM: 141 meq/L (ref 136–145)
TOTAL PROTEIN: 5.9 g/dL — AB (ref 6.4–8.3)

## 2015-07-12 LAB — TECHNOLOGIST REVIEW

## 2015-07-12 LAB — HOLD TUBE, BLOOD BANK

## 2015-07-12 MED ORDER — DIPHENHYDRAMINE HCL 25 MG PO CAPS
25.0000 mg | ORAL_CAPSULE | Freq: Once | ORAL | Status: AC
  Administered 2015-07-12: 25 mg via ORAL
  Filled 2015-07-12: qty 1

## 2015-07-12 MED ORDER — SODIUM CHLORIDE 0.9 % IJ SOLN
10.0000 mL | INTRAMUSCULAR | Status: AC | PRN
  Administered 2015-07-12: 10 mL

## 2015-07-12 MED ORDER — OXYCODONE HCL ER 20 MG PO T12A: 40.0000 mg | EXTENDED_RELEASE_TABLET | Freq: Three times a day (TID) | ORAL | Status: AC

## 2015-07-12 MED ORDER — OXYCODONE HCL ER 20 MG PO T12A: 20.0000 mg | EXTENDED_RELEASE_TABLET | Freq: Two times a day (BID) | ORAL | Status: DC

## 2015-07-12 MED ORDER — HEPARIN SOD (PORK) LOCK FLUSH 100 UNIT/ML IV SOLN
500.0000 [IU] | Freq: Every day | INTRAVENOUS | Status: AC | PRN
  Administered 2015-07-12: 500 [IU]
  Filled 2015-07-12: qty 5

## 2015-07-12 MED ORDER — OXYCODONE HCL 10 MG PO TABS: 10.0000 mg | ORAL_TABLET | ORAL | Status: AC | PRN

## 2015-07-12 MED ORDER — OXYCODONE HCL 10 MG PO TABS: 10.0000 mg | ORAL_TABLET | ORAL | Status: DC | PRN

## 2015-07-12 MED ORDER — SODIUM CHLORIDE 0.9 % IV SOLN
250.0000 mL | Freq: Once | INTRAVENOUS | Status: AC
  Administered 2015-07-12: 250 mL via INTRAVENOUS

## 2015-07-12 NOTE — Procedures (Signed)
[facility]  Procedure Note  [name] [name] YYT:[id number] DOB: 1951/08/18 DOA: 07/12/2015   PCP: Harvie Junior, MD   Associated Diagnosis: Malignant neoplasm of upper lobe of lung, unspecified laterality  Procedure Note: Transfusion of 2 units PRBCs   Condition During Procedure:  Pt tolerated well   Condition at Discharge:  Pt accompanied by daughter; transported by wheelchair; no complications noted   Nigel Sloop, [facility]

## 2015-07-12 NOTE — Telephone Encounter (Signed)
per pof to sch pt appt-sent MW email to sch pt trmt-will call pt after reply w/tran time & date

## 2015-07-12 NOTE — Progress Notes (Signed)
.  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   02/24/2015 Imaging CT chest, abdomen and pelvis showed diffuse bone metastasis throughout the whole spine, sternum, and some of the ribs, skull, and pelvis. brain MRI was negative for metastasis   03/04/2015 Pathology Results left iliac crest lytic bone lesion biopsy showed metastatic adenocarcinoma with abundant mucin, the morphology and IHC are consistent with her prior lung adenocarcinoma.   03/08/2015 - 03/12/2015 Hospital Admission she was admitted for intractable nausea, vomiting, and worsening pain. She was seen by radiation oncologist Dr. Sondra Come during her stay, and her pain medication was adjusted.   03/19/2015 - 04/07/2015 Radiation Therapy palliative radiation to left pelvis, 30 gray in 10 fractions   03/27/2015 Miscellaneous her tumor PD-L1 IHC (-)   04/02/2015 - 04/06/2015 Hospital Admission She was admitted again for abdominal pain, nausea and vomiting. Workup was negative. She was managed conservatively.   04/29/2015 - 05/07/2015 Hospital Admission Patient was admitted for abdominal pain, nausea and vomiting. CT scan reviewed worsening bone  metastasis. Palliative medicine was consulted, she was managed by pain medication.   06/02/2015 - 06/07/2015 Hospital Admission Patient was admitted for symptomatic anemia. She received blood transfusion. She was discharged home with hospice.   07/04/2015 - 07/07/2015 Hospital Admission Patient was admitted for altered mental status and acute renal failure. She was treated with IV fluids and supportive care.     HISTORY OF PRESENTING ILLNESS:  [name] [name] 64 y.o. female  with multiple comorbidities including untreatedihepatitis C , stage 1 right lung cancer status post lobectomy in 2014 , who was referred to our clinic because of  her recent abnormal MRI lumbar spine findings, which is highly suspicious for metastatic cancer.  She had lumber spine infusion and screws placement in sep 2014 for her back pain, which improved after surgery but she has had some residual back pain since then but was tolerable. She reprots worseing back for the past 4-5 month, and it bacame unbearable in the past few weeks and she presented to ED on 02/02/15. Lumber MRI was done which showed diffuse bone lesions which was highly syuspecious for metastases. She also developed worsening left leg weakness in the past 2 months, able to walk independently, and mild numbness at left foot. She has been limping lately due to the back pain, no urine or stool incontinuce. She takes oxycodone twice daily which was prescribed by her primary care physician.    She lost some of appetie, lost 15 lb in the last year, No fever or chills. (+) cough with yellow sputum, and some chest discomfort, no dyspnea on exertion.  She lives with her son, she is able to take care  of herself, but not much else. She spends most of time sitting or laying down during daytime.   Her last mammogram was one years ago, last colonoscopy one years ago,  which were all normal normal per patient.   INTERIM HISTORY: She returns for follow-up. She was admitted to  hospital again on 07/04/2015 for acute renal bleeding and confusion. Patient's daughter was not happy with the [facility] hospice care, felt she was on to high dose of pain medication, and they were not due to blood transfusion. She was not eating or drinking much before the hospitalization. Her AKI raesolved after Hydration. She was discharged home. Her daughter does not want her to be enrolled to hospice again. She appears to be somewhat confused, although she knows her name, and recognize her daughter and me.   MEDICAL HISTORY:  Past Medical History  Diagnosis Date  . Hypertension   . Bipolar 1 disorder   . Heart murmur   . Sciatica   . Arthritis   . Anxiety   . Diverticulosis   . Chronic low back pain   . DDD (degenerative disc disease), lumbar   . Chronic shoulder pain   . Chronic neck pain   . Hepatitis B     Unclear when initially diagnosed, labs in Epic from 03/09/13  . Hepatitis C     Unclear when initially diagnosed, labs in Epic from 03/09/13  . C. difficile diarrhea     April and February 2014  . COPD (chronic obstructive pulmonary disease)   . GERD (gastroesophageal reflux disease)   . H/O hiatal hernia   . Headache(784.0)     hx  . PONV (postoperative nausea and vomiting)   . Seizures     on meds  . Diabetes mellitus   . Adenocarcinoma of lung, stage 1   . GERD (gastroesophageal reflux disease)     SURGICAL HISTORY: Past Surgical History  Procedure Laterality Date  . Facial tumor removal Right 2000  . Brain tumor removal  09  . Brain surgery      in [place]  . Video bronchoscopy  12/04/2012    Procedure: VIDEO BRONCHOSCOPY;  Surgeon: Grace Isaac, MD;  Location: [facility] OR;  Service: Thoracic;  Laterality: N/A;  . Video assisted thoracoscopy (vats)/wedge resection  12/04/2012    Procedure: VIDEO ASSISTED THORACOSCOPY (VATS)/WEDGE RESECTION;  Surgeon: Grace Isaac, MD;  Location: [employer];  Service: Thoracic;  Laterality: Right;  . Lobectomy  12/04/2012     Procedure: LOBECTOMY;  Surgeon: Grace Isaac, MD;  Location: [place];  Service: Thoracic;  Laterality: Right;  completion of right upper lobectomy and lymph node disection, placement of on q pump  . Tubal ligation    . Tonsillectomy    . Anterior cervical decomp/discectomy fusion N/A 03/05/2014    Procedure: ANTERIOR CERVICAL DECOMPRESSION/DISCECTOMY FUSION 2 LEVELS;  Surgeon: Sinclair Ship, MD;  Location: [place];  Service: Orthopedics;  Laterality: N/A;  Anterior cervical decompression fusion, cervical 4-5, cervical 5-6 with instrumentation, allograft.    SOCIAL HISTORY: History   Social History  . Marital Status: Widowed    Spouse Name: N/A  . Number of Children: 5  . Years of Education: N/A   Occupational History  . n/a     patient draws SNN/SS[employer]   Social History Main Topics  . Smoking status: Former Smoker -- 0.25 packs/day for 10 years    Types: Cigarettes    Quit date: 07/07/2013  . Smokeless tobacco: Never  Used     Comment: Smokes pk q 3 days   marajuna aug  . Alcohol Use: No     Comment: quit 70yr ago  . Drug Use: Yes    Special: Hydrocodone, Marijuana     Comment: "maybe about once a month"  . Sexual Activity: No   Other Topics Concern  . Not on file   Social History Narrative    FAMILY HISTORY: Family History  Problem Relation Age of Onset  . Hypertension Father     deceased  . Diabetes Father   . Heart disease Father   . Hyperlipidemia Father   . Hypertension Mother   . Hyperlipidemia Mother   . Diabetes Mother   . Hypertension Sister   . Hyperlipidemia Sister   . Hypertension Brother   . Hyperlipidemia Brother     ALLERGIES:  is allergic to haldol; acetaminophen; ibuprofen; metronidazole; morphine and related; and penicillins.  MEDICATIONS:  Current Outpatient Prescriptions  Medication Sig Dispense Refill  . ALPRAZolam (XANAX) 0.25 MG tablet Take 1 tablet (0.25 mg total) by mouth at bedtime as needed for anxiety. 10 tablet 0  .  dicyclomine (BENTYL) 20 MG tablet Take 20 mg by mouth 3 (three) times daily with meals.    . DULoxetine (CYMBALTA) 30 MG capsule Take 1 capsule (30 mg total) by mouth daily. 30 capsule 0  . feeding supplement (BOOST / RESOURCE BREEZE) LIQD Take 1 Container by mouth 3 (three) times daily between meals.  0  . metoCLOPramide (REGLAN) 5 MG tablet Take 1 tablet (5 mg total) by mouth 3 (three) times daily before meals. 30 tablet 2  . metoprolol tartrate (LOPRESSOR) 25 MG tablet Take 25 mg by mouth daily.    .Marland Kitchenomeprazole (PRILOSEC) 20 MG capsule Take 1 capsule (20 mg total) by mouth 2 (two) times daily before a meal. 60 capsule 5  . ondansetron (ZOFRAN-ODT) 8 MG disintegrating tablet Take 1 tablet (8 mg total) by mouth every 6 (six) hours as needed for nausea or vomiting. (Patient taking differently: Take 8 mg by mouth daily as needed for nausea or vomiting. ) 20 tablet 2  . OXcarbazepine (TRILEPTAL) 150 MG tablet Take 150 mg by mouth 2 (two) times daily.     . OxyCODONE (OXYCONTIN) 20 mg T12A 12 hr tablet Take 2 tablets (40 mg total) by mouth every 8 (eight) hours. 120 tablet 0  . Oxycodone HCl 10 MG TABS Take 1 tablet (10 mg total) by mouth every 4 (four) hours as needed (moderate pain, severe pain, breakthrough pain). 90 tablet 0  . Sennosides-Docusate Sodium (SENNA S PO) Take 2 tablets by mouth 2 (two) times daily.  1  . simvastatin (ZOCOR) 20 MG tablet Take 20 mg by mouth at bedtime.      No current facility-administered medications for this visit.   Facility-Administered Medications Ordered in Other Visits  Medication Dose Route Frequency Provider Last Rate Last Dose  . heparin lock flush 100 unit/mL  500 Units Intracatheter Daily PRN YTruitt Merle MD      . sodium chloride 0.9 % injection 10 mL  10 mL Intracatheter PRN YTruitt Merle MD        REVIEW OF SYSTEMS:   Constitutional: Denies fevers, chills or abnormal night sweats, (+) malaise and weight loss  Eyes: Denies blurriness of vision, double  vision or watery eyes Ears, nose, mouth, throat, and face: Denies mucositis or sore throat Respiratory:(+) cpugh, no dyspnea or wheezes Cardiovascular: Denies palpitation, chest discomfort or lower  extremity swelling Gastrointestinal:  Denies nausea, heartburn or change in bowel habits Skin: Denies abnormal skin rashes Lymphatics: Denies new lymphadenopathy or easy bruising Neurological:Denies numbness, tingling or new weaknesses Behavioral/Psych: Mood is stable, no new changes  All other systems were reviewed with the patient and are negative.  PHYSICAL EXAMINATION: ECOG PERFORMANCE STATUS: 3  Filed Vitals:   07/12/15 1107  BP: 94/49  Pulse: 104  Temp: 98.4 F (36.9 C)  Resp: 16   Filed Weights   07/12/15 1107  Weight: 104 lb 4.8 oz (47.31 kg)    GENERAL:alert, not in acute distress. SKIN: skin color, texture, turgor are normal, no rashes or significant lesions. EYES: normal, conjunctiva are pink and non-injected, sclera clear OROPHARYNX:no exudate, no erythema and lips, buccal mucosa, and tongue normal  NECK: supple, thyroid normal size, non-tender, without nodularity LYMPH:  no palpable lymphadenopathy in the cervical, axillary or inguinal LUNGS: clear to auscultation and percussion with normal breathing effort HEART: regular rate & rhythm and no murmurs and no lower extremity edema ABDOMEN:abdomen soft, non-tender and normal bowel sounds Musculoskeletal:no cyanosis of digits and no clubbing no significant tenderness on spine  PSYCH: alert & oriented x 3 with fluent speech NEURO: no focal motor/sensory deficits LEG: no edema   LABORATORY DATA:  I have reviewed the data as listed CBC Latest Ref Rng 07/12/2015 07/06/2015 07/05/2015  WBC 3.9 - 10.3 10e3/uL 4.6 5.7 4.6  Hemoglobin 11.6 - 15.9 g/dL 7.3(L) 8.5(L) 7.6(L)  Hematocrit 34.8 - 46.6 % 23.3(L) 26.3(L) 23.9(L)  Platelets 145 - 400 10e3/uL 13(L) 21(LL) 18(LL)    CMP Latest Ref Rng 07/12/2015 07/06/2015 07/05/2015    Glucose 70 - 140 mg/dl 97 179(H) 82  BUN 7.0 - 26.0 mg/dL 62.0(H) 53(H) 107(H)  Creatinine 0.6 - 1.1 mg/dL 2.2(H) 0.67 4.29(H)  Sodium 136 - 145 mEq/L 141 145 136  Potassium 3.5 - 5.1 mEq/L 4.2 3.0(L) 3.6  Chloride 101 - 111 mmol/L - 107 98(L)  CO2 22 - 29 mEq/L _0 Calcium 8.4 - 10.4 mg/dL 9.3 8.9 8.7(L)  Total Protein 6.4 - 8.3 g/dL 5.9(L) - -  Total Bilirubin 0.20 - 1.20 mg/dL 0.55 - -  Alkaline Phos 40 - 150 U/L 302(H) - -  AST 5 - 34 U/L 26 - -  ALT 0 - 55 U/L 13 - -     Pathology report Bone, biopsy, lytic lesion involving the left iliac crest 03/04/2015 - METASTATIC ADENOCARCINOMA WITH ABUNDANT MUCIN. Microscopic Comment The morphologic features are compatible with metastasis from the previous right upper lobe well-differentiated adenocarcinoma with abundant mucin from 12/12/2012 [phone]). ADDENDUM: At the request of Dr. Burr Medico, immunohistochemistry is performed and the tumor cells are positive with cytokeratin 7, cytokeratin 20, and CDX2. Napsin A, thyroid transcription factor-1, estrogen receptor, progesterone receptor, and gross cystic disease fluid protein are negative. The immunophenotype is similar to the immunophenotype reported for the previous adenocarcinoma with abundant mucin from the lung.   RADIOGRAPHIC STUDIES: I have personally reviewed the radiological images as listed and agreed with the findings in the report. No new scans   ASSESSMENT & PLAN:  64 year old female, with significant history of smoking and stage I right lung adenocarcinoma status post lobectomy in 2014, no presented with was any back pain and diffuse bone lesions on the lumbar spine MRI.  1. 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 I recommend her to  consider hospice again, and I'll refer her to a different hospice program. However her daughter is not willing to consider hospice. -Her lab work reviewed anemia with hemoglobin 7.3, and acute renal failure with creatinine of 2.2 -I'll give her 2 units RBCs today. I encouraged her to drink more fluids -I would not give her platelet transfusion, she has no clinical signs of bleeding. -I reviewed her oxycodone and OxyContin for pain control -I advised her daughter to stop Cymbalta, Neurontin and Zocor -I'll see her back in 2 weeks  l questions were answered. The patient knows to call the clinic with any problems, questions or concerns. I spent 30 minutes counseling the patient face to face. The total time spent in the appointment was 40 minutes and more than 50% was on counseling.    Truitt Merle, MD 07/12/2015 5:59 PM

## 2015-07-13 LAB — TYPE AND SCREEN
ABO/RH(D): O POS
ANTIBODY SCREEN: NEGATIVE
UNIT DIVISION: 0
Unit division: 0

## 2015-07-14 ENCOUNTER — Telehealth: Payer: Self-pay | Admitting: Hematology

## 2015-07-14 NOTE — Telephone Encounter (Signed)
cld pt to adv of lab appt prior to appt on 9/6 @ 12:45

## 2015-07-27 ENCOUNTER — Ambulatory Visit: Payer: Self-pay | Admitting: Hematology

## 2015-07-27 ENCOUNTER — Other Ambulatory Visit: Payer: Self-pay

## 2015-07-28 ENCOUNTER — Emergency Department (HOSPITAL_COMMUNITY): Payer: Medicare HMO

## 2015-07-28 ENCOUNTER — Inpatient Hospital Stay (HOSPITAL_COMMUNITY)
Admission: EM | Admit: 2015-07-28 | Discharge: 2015-08-21 | DRG: 064 | Disposition: E | Payer: Medicare HMO | Attending: Internal Medicine | Admitting: Internal Medicine

## 2015-07-28 ENCOUNTER — Inpatient Hospital Stay (HOSPITAL_COMMUNITY): Payer: Medicare HMO

## 2015-07-28 ENCOUNTER — Encounter (HOSPITAL_COMMUNITY): Payer: Self-pay | Admitting: Emergency Medicine

## 2015-07-28 ENCOUNTER — Telehealth: Payer: Self-pay | Admitting: Hematology

## 2015-07-28 DIAGNOSIS — I69322 Dysarthria following cerebral infarction: Secondary | ICD-10-CM | POA: Diagnosis not present

## 2015-07-28 DIAGNOSIS — K117 Disturbances of salivary secretion: Secondary | ICD-10-CM | POA: Diagnosis not present

## 2015-07-28 DIAGNOSIS — F319 Bipolar disorder, unspecified: Secondary | ICD-10-CM | POA: Diagnosis present

## 2015-07-28 DIAGNOSIS — Z923 Personal history of irradiation: Secondary | ICD-10-CM

## 2015-07-28 DIAGNOSIS — C7931 Secondary malignant neoplasm of brain: Secondary | ICD-10-CM | POA: Diagnosis present

## 2015-07-28 DIAGNOSIS — G9389 Other specified disorders of brain: Secondary | ICD-10-CM | POA: Diagnosis present

## 2015-07-28 DIAGNOSIS — G893 Neoplasm related pain (acute) (chronic): Secondary | ICD-10-CM | POA: Diagnosis present

## 2015-07-28 DIAGNOSIS — R29810 Facial weakness: Secondary | ICD-10-CM | POA: Diagnosis present

## 2015-07-28 DIAGNOSIS — B1920 Unspecified viral hepatitis C without hepatic coma: Secondary | ICD-10-CM | POA: Diagnosis present

## 2015-07-28 DIAGNOSIS — I4891 Unspecified atrial fibrillation: Secondary | ICD-10-CM | POA: Diagnosis not present

## 2015-07-28 DIAGNOSIS — I69392 Facial weakness following cerebral infarction: Secondary | ICD-10-CM | POA: Diagnosis not present

## 2015-07-28 DIAGNOSIS — D6959 Other secondary thrombocytopenia: Secondary | ICD-10-CM | POA: Diagnosis present

## 2015-07-28 DIAGNOSIS — D6869 Other thrombophilia: Secondary | ICD-10-CM | POA: Diagnosis present

## 2015-07-28 DIAGNOSIS — E119 Type 2 diabetes mellitus without complications: Secondary | ICD-10-CM | POA: Diagnosis present

## 2015-07-28 DIAGNOSIS — Z978 Presence of other specified devices: Secondary | ICD-10-CM

## 2015-07-28 DIAGNOSIS — R64 Cachexia: Secondary | ICD-10-CM | POA: Diagnosis present

## 2015-07-28 DIAGNOSIS — E876 Hypokalemia: Secondary | ICD-10-CM | POA: Diagnosis present

## 2015-07-28 DIAGNOSIS — D61810 Antineoplastic chemotherapy induced pancytopenia: Secondary | ICD-10-CM | POA: Diagnosis present

## 2015-07-28 DIAGNOSIS — E440 Moderate protein-calorie malnutrition: Secondary | ICD-10-CM | POA: Diagnosis present

## 2015-07-28 DIAGNOSIS — E43 Unspecified severe protein-calorie malnutrition: Secondary | ICD-10-CM | POA: Diagnosis present

## 2015-07-28 DIAGNOSIS — R569 Unspecified convulsions: Secondary | ICD-10-CM | POA: Diagnosis not present

## 2015-07-28 DIAGNOSIS — C3411 Malignant neoplasm of upper lobe, right bronchus or lung: Secondary | ICD-10-CM | POA: Diagnosis not present

## 2015-07-28 DIAGNOSIS — Z515 Encounter for palliative care: Secondary | ICD-10-CM | POA: Diagnosis not present

## 2015-07-28 DIAGNOSIS — R4020 Unspecified coma: Secondary | ICD-10-CM | POA: Diagnosis not present

## 2015-07-28 DIAGNOSIS — G8929 Other chronic pain: Secondary | ICD-10-CM | POA: Diagnosis present

## 2015-07-28 DIAGNOSIS — K5900 Constipation, unspecified: Secondary | ICD-10-CM | POA: Diagnosis present

## 2015-07-28 DIAGNOSIS — R627 Adult failure to thrive: Secondary | ICD-10-CM | POA: Diagnosis present

## 2015-07-28 DIAGNOSIS — R570 Cardiogenic shock: Secondary | ICD-10-CM | POA: Diagnosis not present

## 2015-07-28 DIAGNOSIS — I10 Essential (primary) hypertension: Secondary | ICD-10-CM | POA: Diagnosis present

## 2015-07-28 DIAGNOSIS — Z4659 Encounter for fitting and adjustment of other gastrointestinal appliance and device: Secondary | ICD-10-CM

## 2015-07-28 DIAGNOSIS — Z66 Do not resuscitate: Secondary | ICD-10-CM | POA: Diagnosis not present

## 2015-07-28 DIAGNOSIS — I69320 Aphasia following cerebral infarction: Secondary | ICD-10-CM | POA: Diagnosis not present

## 2015-07-28 DIAGNOSIS — D696 Thrombocytopenia, unspecified: Secondary | ICD-10-CM | POA: Diagnosis not present

## 2015-07-28 DIAGNOSIS — I639 Cerebral infarction, unspecified: Secondary | ICD-10-CM | POA: Diagnosis present

## 2015-07-28 DIAGNOSIS — R404 Transient alteration of awareness: Secondary | ICD-10-CM | POA: Diagnosis not present

## 2015-07-28 DIAGNOSIS — J811 Chronic pulmonary edema: Secondary | ICD-10-CM

## 2015-07-28 DIAGNOSIS — R0600 Dyspnea, unspecified: Secondary | ICD-10-CM

## 2015-07-28 DIAGNOSIS — J9601 Acute respiratory failure with hypoxia: Secondary | ICD-10-CM | POA: Diagnosis not present

## 2015-07-28 DIAGNOSIS — R471 Dysarthria and anarthria: Secondary | ICD-10-CM | POA: Diagnosis present

## 2015-07-28 DIAGNOSIS — R040 Epistaxis: Secondary | ICD-10-CM | POA: Diagnosis not present

## 2015-07-28 DIAGNOSIS — Z85118 Personal history of other malignant neoplasm of bronchus and lung: Secondary | ICD-10-CM

## 2015-07-28 DIAGNOSIS — I6789 Other cerebrovascular disease: Secondary | ICD-10-CM | POA: Diagnosis not present

## 2015-07-28 DIAGNOSIS — Z87891 Personal history of nicotine dependence: Secondary | ICD-10-CM

## 2015-07-28 DIAGNOSIS — R1310 Dysphagia, unspecified: Secondary | ICD-10-CM | POA: Diagnosis present

## 2015-07-28 DIAGNOSIS — J449 Chronic obstructive pulmonary disease, unspecified: Secondary | ICD-10-CM | POA: Diagnosis present

## 2015-07-28 DIAGNOSIS — N390 Urinary tract infection, site not specified: Secondary | ICD-10-CM | POA: Diagnosis present

## 2015-07-28 DIAGNOSIS — R4189 Other symptoms and signs involving cognitive functions and awareness: Secondary | ICD-10-CM

## 2015-07-28 DIAGNOSIS — Z681 Body mass index (BMI) 19 or less, adult: Secondary | ICD-10-CM | POA: Diagnosis not present

## 2015-07-28 DIAGNOSIS — C3490 Malignant neoplasm of unspecified part of unspecified bronchus or lung: Secondary | ICD-10-CM | POA: Insufficient documentation

## 2015-07-28 DIAGNOSIS — C7951 Secondary malignant neoplasm of bone: Secondary | ICD-10-CM | POA: Diagnosis present

## 2015-07-28 DIAGNOSIS — I63412 Cerebral infarction due to embolism of left middle cerebral artery: Principal | ICD-10-CM | POA: Diagnosis present

## 2015-07-28 DIAGNOSIS — Z01818 Encounter for other preprocedural examination: Secondary | ICD-10-CM

## 2015-07-28 DIAGNOSIS — T451X5A Adverse effect of antineoplastic and immunosuppressive drugs, initial encounter: Secondary | ICD-10-CM

## 2015-07-28 DIAGNOSIS — R4701 Aphasia: Secondary | ICD-10-CM | POA: Diagnosis present

## 2015-07-28 DIAGNOSIS — C799 Secondary malignant neoplasm of unspecified site: Secondary | ICD-10-CM | POA: Diagnosis not present

## 2015-07-28 DIAGNOSIS — D630 Anemia in neoplastic disease: Secondary | ICD-10-CM | POA: Diagnosis not present

## 2015-07-28 DIAGNOSIS — M8580 Other specified disorders of bone density and structure, unspecified site: Secondary | ICD-10-CM | POA: Diagnosis present

## 2015-07-28 DIAGNOSIS — I469 Cardiac arrest, cause unspecified: Secondary | ICD-10-CM | POA: Insufficient documentation

## 2015-07-28 LAB — URINE MICROSCOPIC-ADD ON

## 2015-07-28 LAB — URINALYSIS, ROUTINE W REFLEX MICROSCOPIC
Bilirubin Urine: NEGATIVE
Glucose, UA: NEGATIVE mg/dL
Hgb urine dipstick: NEGATIVE
Ketones, ur: 15 mg/dL — AB
NITRITE: POSITIVE — AB
Protein, ur: 30 mg/dL — AB
SPECIFIC GRAVITY, URINE: 1.016 (ref 1.005–1.030)
UROBILINOGEN UA: 1 mg/dL (ref 0.0–1.0)
pH: 8.5 — ABNORMAL HIGH (ref 5.0–8.0)

## 2015-07-28 LAB — COMPREHENSIVE METABOLIC PANEL
ALT: 13 U/L — AB (ref 14–54)
ANION GAP: 12 (ref 5–15)
AST: 32 U/L (ref 15–41)
Albumin: 2.8 g/dL — ABNORMAL LOW (ref 3.5–5.0)
Alkaline Phosphatase: 321 U/L — ABNORMAL HIGH (ref 38–126)
BUN: 23 mg/dL — ABNORMAL HIGH (ref 6–20)
CHLORIDE: 96 mmol/L — AB (ref 101–111)
CO2: 29 mmol/L (ref 22–32)
CREATININE: 0.69 mg/dL (ref 0.44–1.00)
Calcium: 9.5 mg/dL (ref 8.9–10.3)
Glucose, Bld: 142 mg/dL — ABNORMAL HIGH (ref 65–99)
POTASSIUM: 2.8 mmol/L — AB (ref 3.5–5.1)
SODIUM: 137 mmol/L (ref 135–145)
Total Bilirubin: 0.9 mg/dL (ref 0.3–1.2)
Total Protein: 5.4 g/dL — ABNORMAL LOW (ref 6.5–8.1)

## 2015-07-28 LAB — DIFFERENTIAL
BAND NEUTROPHILS: 12 % — AB (ref 0–10)
BASOS PCT: 0 % (ref 0–1)
Basophils Absolute: 0 10*3/uL (ref 0.0–0.1)
Blasts: 0 %
EOS ABS: 0 10*3/uL (ref 0.0–0.7)
Eosinophils Relative: 0 % (ref 0–5)
LYMPHS ABS: 1 10*3/uL (ref 0.7–4.0)
LYMPHS PCT: 17 % (ref 12–46)
MONO ABS: 0.4 10*3/uL (ref 0.1–1.0)
MYELOCYTES: 0 %
Metamyelocytes Relative: 0 %
Monocytes Relative: 7 % (ref 3–12)
Neutro Abs: 4.7 10*3/uL (ref 1.7–7.7)
Neutrophils Relative %: 64 % (ref 43–77)
OTHER: 0 %
PROMYELOCYTES ABS: 0 %
nRBC: 20 /100 WBC — ABNORMAL HIGH

## 2015-07-28 LAB — I-STAT CHEM 8, ED
BUN: 25 mg/dL — ABNORMAL HIGH (ref 6–20)
CREATININE: 0.8 mg/dL (ref 0.44–1.00)
Calcium, Ion: 1.14 mmol/L (ref 1.13–1.30)
Chloride: 94 mmol/L — ABNORMAL LOW (ref 101–111)
Glucose, Bld: 138 mg/dL — ABNORMAL HIGH (ref 65–99)
HEMATOCRIT: 29 % — AB (ref 36.0–46.0)
HEMOGLOBIN: 9.9 g/dL — AB (ref 12.0–15.0)
POTASSIUM: 2.8 mmol/L — AB (ref 3.5–5.1)
SODIUM: 137 mmol/L (ref 135–145)
TCO2: 30 mmol/L (ref 0–100)

## 2015-07-28 LAB — RAPID URINE DRUG SCREEN, HOSP PERFORMED
Amphetamines: NOT DETECTED
Barbiturates: NOT DETECTED
Benzodiazepines: POSITIVE — AB
Cocaine: NOT DETECTED
OPIATES: POSITIVE — AB
Tetrahydrocannabinol: POSITIVE — AB

## 2015-07-28 LAB — PROTIME-INR
INR: 1.39 (ref 0.00–1.49)
PROTHROMBIN TIME: 17.1 s — AB (ref 11.6–15.2)

## 2015-07-28 LAB — CBC
HEMATOCRIT: 30.8 % — AB (ref 36.0–46.0)
Hemoglobin: 9.9 g/dL — ABNORMAL LOW (ref 12.0–15.0)
MCH: 28.6 pg (ref 26.0–34.0)
MCHC: 32.1 g/dL (ref 30.0–36.0)
MCV: 89 fL (ref 78.0–100.0)
PLATELETS: 30 10*3/uL — AB (ref 150–400)
RBC: 3.46 MIL/uL — ABNORMAL LOW (ref 3.87–5.11)
RDW: 19.5 % — AB (ref 11.5–15.5)
WBC: 6.1 10*3/uL (ref 4.0–10.5)

## 2015-07-28 LAB — APTT: aPTT: 27 seconds (ref 24–37)

## 2015-07-28 LAB — I-STAT TROPONIN, ED: TROPONIN I, POC: 0.08 ng/mL (ref 0.00–0.08)

## 2015-07-28 LAB — CBG MONITORING, ED: GLUCOSE-CAPILLARY: 130 mg/dL — AB (ref 65–99)

## 2015-07-28 LAB — ETHANOL

## 2015-07-28 MED ORDER — STROKE: EARLY STAGES OF RECOVERY BOOK
Freq: Once | Status: AC
  Administered 2015-07-28
  Filled 2015-07-28: qty 1

## 2015-07-28 MED ORDER — SODIUM CHLORIDE 0.9 % IV BOLUS (SEPSIS)
1000.0000 mL | Freq: Once | INTRAVENOUS | Status: AC
  Administered 2015-07-28: 1000 mL via INTRAVENOUS

## 2015-07-28 MED ORDER — MUSCLE RUB 10-15 % EX CREA: 1.0000 "application " | TOPICAL_CREAM | Freq: Every day | CUTANEOUS | Status: DC | PRN

## 2015-07-28 MED ORDER — POTASSIUM CHLORIDE 10 MEQ/100ML IV SOLN
10.0000 meq | INTRAVENOUS | Status: AC
  Administered 2015-07-29 (×4): 10 meq via INTRAVENOUS
  Filled 2015-07-28 (×4): qty 100

## 2015-07-28 MED ORDER — ASPIRIN 325 MG PO TABS: 325.0000 mg | ORAL_TABLET | Freq: Every day | ORAL | Status: DC

## 2015-07-28 MED ORDER — SODIUM CHLORIDE 0.9 % IV SOLN
INTRAVENOUS | Status: DC
Start: 2015-07-28 — End: 2015-08-01
  Administered 2015-07-28 – 2015-07-31 (×5): via INTRAVENOUS

## 2015-07-28 MED ORDER — ONDANSETRON HCL 4 MG PO TABS: 4.0000 mg | ORAL_TABLET | Freq: Four times a day (QID) | ORAL | Status: DC | PRN

## 2015-07-28 MED ORDER — BISACODYL 10 MG RE SUPP: 10.0000 mg | Freq: Every day | RECTAL | Status: DC | PRN

## 2015-07-28 MED ORDER — SODIUM CHLORIDE 0.9 % IV SOLN
1000.0000 mg | Freq: Once | INTRAVENOUS | Status: AC
  Administered 2015-07-28: 1000 mg via INTRAVENOUS
  Filled 2015-07-28: qty 10

## 2015-07-28 MED ORDER — ASPIRIN 300 MG RE SUPP
300.0000 mg | Freq: Every day | RECTAL | Status: DC
  Administered 2015-07-29: 300 mg via RECTAL
  Filled 2015-07-28: qty 1

## 2015-07-28 MED ORDER — HEPARIN SODIUM (PORCINE) 5000 UNIT/ML IJ SOLN: 5000.0000 [IU] | Freq: Three times a day (TID) | INTRAMUSCULAR | Status: DC

## 2015-07-28 MED ORDER — SODIUM CHLORIDE 0.9 % IJ SOLN
3.0000 mL | Freq: Two times a day (BID) | INTRAMUSCULAR | Status: DC
  Administered 2015-07-28 – 2015-08-04 (×5): 3 mL via INTRAVENOUS

## 2015-07-28 MED ORDER — HYDROMORPHONE HCL 1 MG/ML IJ SOLN
1.0000 mg | INTRAMUSCULAR | Status: DC
  Administered 2015-07-28 – 2015-07-30 (×11): 1 mg via INTRAVENOUS
  Filled 2015-07-28 (×12): qty 1

## 2015-07-28 MED ORDER — ONDANSETRON HCL 4 MG/2ML IJ SOLN: 4.0000 mg | Freq: Four times a day (QID) | INTRAMUSCULAR | Status: DC | PRN

## 2015-07-28 MED ORDER — SODIUM CHLORIDE 0.9 % IV SOLN
500.0000 mg | Freq: Two times a day (BID) | INTRAVENOUS | Status: DC
  Administered 2015-07-29 – 2015-08-04 (×13): 500 mg via INTRAVENOUS
  Filled 2015-07-28 (×18): qty 5

## 2015-07-28 MED ORDER — DEXTROSE 5 % IV SOLN
1.0000 g | INTRAVENOUS | Status: DC
  Administered 2015-07-28 – 2015-08-01 (×5): 1 g via INTRAVENOUS
  Filled 2015-07-28 (×6): qty 10

## 2015-07-28 NOTE — Code Documentation (Signed)
64yo female arriving to [facility] via [place] at 1209.  Patient was at home at her baseline at 1400 and was later found at 1530 to be nonverbal with right facial droop.  Patient with significant history of cancer with osseous metastasis.  Code stroke activated by EMS.  Stroke team at the bedside on arrival.  Unsuccessful lab draw and patient cleared for CT by Dr. Alvino Chapel.  Patient to CT.  NIHSS 16, see documentation for details and code stroke times.  [name] nonverbal and unable to follow commands on exam.  Patient with continued right facial droop.  PLT 13 on 07/12/2015.  No acute stroke treatment at this time per Dr. Janann Colonel.  EEG ordered.  Bedside handoff with ED RN Richardson Landry.

## 2015-07-28 NOTE — H&P (Signed)
Date: 08/18/2015               Patient Name:  [name] [name] MRN: [id number]  DOB: [birth year] Age / Sex: 64 y.o., female   PCP: Harvie Junior, MD         Medical Service: Internal Medicine Teaching Service         Attending Physician: Dr. Davonna Belling, MD    First Contact: Dr. Juleen China Pager: [phone]  Second Contact: Dr. Genene Churn Pager: [phone]       After Hours (After 5p/  First Contact Pager: [phone]  weekends / holidays): Second Contact Pager: [phone]   Chief Complaint: stroke  History of Present Illness: [name] [name] is a 64 yo female with metastatic lung cancer, HTN, Hep C, pancytopenia, and h/o left frontal brain tumor s/p resection in 2009.  Patient presents with a 4 hour h/o right facial droop, inability to talk, and inability to follow directions.  History is obtained from the daughter. Daughter states she was called by her brother when he noticed that the patient had gotten into the shower by herself.  When he went to investigate, she was noted to be "acting weird."  After helping patient out of shower, she was noted to have right facial droop and was not talking or following directions.  Daughter denies reports of fall, LOC, or loss of bowel or bladder control while in the shower.  She has no h/o seizure.  She is currently on prophylactic lamictal after resection of a left frontal brain tumor.  Patient's daughter does not recall what kind of tumor it was.  EMS was called and symptoms persisted in the ED.  Prior to this episode, patient performed her own ADLs.  Daughter performed the patient's IADLs.    Per patient's daughter, patient had not been complaining of fever, cough, or dysuria.  Daughter does endorse malodorous urine.  Patient endorsed nausea and vomiting 2 days ago, with [name] [name], nonbloody emesis.  Daughter reports patient often nauseous after taking her pain medications.  She also endorsed constipation, needing milk of magnesia daily for the last week.  She endorses  chronic, diffuse, abdominal pain.    In the ED, patient was evaluated by Neurology. tPA was deferred 2/2 thrombocytopenia (plts 30).  CT head showed chronic left frontal encephalomalacia, small vessel ischemic disease, and areas of relative osteopenia.  EEG demonstrated no active seizure, and possible seizure focus in right central parietal region 2/2 benzo use.  Left frontal dysfunction was also noted.  Of note, the patient has metastatic lung cancer, and is followed by Dr. Burr Medico at [facility]  MRI in April 2016 was negative for brain metastases.  However, CT showed diffuse bone metastases to whole spine, sternum, some ribs, skull, and pelvis.  She was also recently admitted to [facility] on 8/14 for weakness and anuria.     Meds: Current Facility-Administered Medications  Medication Dose Route Frequency Provider Last Rate Last Dose  . [START ON 07/29/2015] levETIRAcetam (KEPPRA) 500 mg in sodium chloride 0.9 % 100 mL IVPB  500 mg Intravenous Q12H Drema Dallas, DO       Current Outpatient Prescriptions  Medication Sig Dispense Refill  . ALPRAZolam (XANAX) 0.25 MG tablet Take 1 tablet (0.25 mg total) by mouth at bedtime as needed for anxiety. 10 tablet 0  . magnesium hydroxide (MILK OF MAGNESIA) 800 MG/5ML suspension Take 30 mLs by mouth daily as needed for constipation.    . Menthol-Methyl Salicylate (MUSCLE RUB) 10-15 %  CREA Apply 1 application topically daily as needed for muscle pain.    . OxyCODONE (OXYCONTIN) 20 mg T12A 12 hr tablet Take 2 tablets (40 mg total) by mouth every 8 (eight) hours. 120 tablet 0  . Oxycodone HCl 10 MG TABS Take 1 tablet (10 mg total) by mouth every 4 (four) hours as needed (moderate pain, severe pain, breakthrough pain). 90 tablet 0  . Sennosides-Docusate Sodium (SENNA S PO) Take 2 tablets by mouth 2 (two) times daily.  1  . DULoxetine (CYMBALTA) 30 MG capsule Take 1 capsule (30 mg total) by mouth daily. (Patient not taking: Reported on 08/12/2015) 30 capsule 0  .  feeding supplement (BOOST / RESOURCE BREEZE) LIQD Take 1 Container by mouth 3 (three) times daily between meals. (Patient not taking: Reported on 08/02/2015)  0  . metoCLOPramide (REGLAN) 5 MG tablet Take 1 tablet (5 mg total) by mouth 3 (three) times daily before meals. (Patient not taking: Reported on 07/30/2015) 30 tablet 2  . omeprazole (PRILOSEC) 20 MG capsule Take 1 capsule (20 mg total) by mouth 2 (two) times daily before a meal. (Patient not taking: Reported on 08/03/2015) 60 capsule 5  . ondansetron (ZOFRAN-ODT) 8 MG disintegrating tablet Take 1 tablet (8 mg total) by mouth every 6 (six) hours as needed for nausea or vomiting. (Patient not taking: Reported on 08/07/2015) 20 tablet 2    Allergies: Allergies as of 08/15/2015 - Review Complete 07/30/2015  Allergen Reaction Noted  . Haldol [haloperidol decanoate] Swelling 11/09/2011  . Acetaminophen Other (See Comments) 02/07/2014  . Ibuprofen Other (See Comments) 02/07/2014  . Metronidazole Other (See Comments) 05/21/2013  . Morphine and related Hives 11/09/2011  . Penicillins Rash 10/08/2012   Past Medical History  Diagnosis Date  . Hypertension   . Bipolar 1 disorder   . Heart murmur   . Sciatica   . Arthritis   . Anxiety   . Diverticulosis   . Chronic low back pain   . DDD (degenerative disc disease), lumbar   . Chronic shoulder pain   . Chronic neck pain   . Hepatitis B     Unclear when initially diagnosed, labs in Epic from 03/09/13  . Hepatitis C     Unclear when initially diagnosed, labs in Epic from 03/09/13  . C. difficile diarrhea     April and February 2014  . COPD (chronic obstructive pulmonary disease)   . GERD (gastroesophageal reflux disease)   . H/O hiatal hernia   . Headache(784.0)     hx  . PONV (postoperative nausea and vomiting)   . Seizures     on meds  . Diabetes mellitus   . Adenocarcinoma of lung, stage 1   . GERD (gastroesophageal reflux disease)    Past Surgical History  Procedure Laterality Date    . Facial tumor removal Right 2000  . Brain tumor removal  09  . Brain surgery      in [place]  . Video bronchoscopy  12/04/2012    Procedure: VIDEO BRONCHOSCOPY;  Surgeon: Grace Isaac, MD;  Location: [facility];  Service: Thoracic;  Laterality: N/A;  . Video assisted thoracoscopy (vats)/wedge resection  12/04/2012    Procedure: VIDEO ASSISTED THORACOSCOPY (VATS)/WEDGE RESECTION;  Surgeon: Grace Isaac, MD;  Location: [facility];  Service: Thoracic;  Laterality: Right;  . Lobectomy  12/04/2012    Procedure: LOBECTOMY;  Surgeon: Grace Isaac, MD;  Location: [facility];  Service: Thoracic;  Laterality: Right;  completion of right  upper lobectomy and lymph node disection, placement of on q pump  . Tubal ligation    . Tonsillectomy    . Anterior cervical decomp/discectomy fusion N/A 03/05/2014    Procedure: ANTERIOR CERVICAL DECOMPRESSION/DISCECTOMY FUSION 2 LEVELS;  Surgeon: Sinclair Ship, MD;  Location: [facility];  Service: Orthopedics;  Laterality: N/A;  Anterior cervical decompression fusion, cervical 4-5, cervical 5-6 with instrumentation, allograft.   Family History  Problem Relation Age of Onset  . Hypertension Father     deceased  . Diabetes Father   . Heart disease Father   . Hyperlipidemia Father   . Hypertension Mother   . Hyperlipidemia Mother   . Diabetes Mother   . Hypertension Sister   . Hyperlipidemia Sister   . Hypertension Brother   . Hyperlipidemia Brother    Social History   Social History  . Marital Status: Widowed    Spouse Name: N/A  . Number of Children: N/A  . Years of Education: N/A   Occupational History  . n/a     patient draws SNN/SSI   Social History Main Topics  . Smoking status: Former Smoker -- 0.25 packs/day for 10 years    Types: Cigarettes    Quit date: 07/07/2013  . Smokeless tobacco: Never Used     Comment: Smokes pk q 3 days   marajuna aug  . Alcohol Use: No     Comment: quit 44yr ago  . Drug Use: Yes    Special:  Hydrocodone, Marijuana     Comment: "maybe about once a month"  . Sexual Activity: No   Other Topics Concern  . Not on file   Social History Narrative    Review of Systems: Review of systems not obtained due to patient factors.  Physical Exam: Blood pressure 134/67, pulse 115, temperature 98 F (36.7 C), temperature source Axillary, resp. rate 13, SpO2 95 %. Physical Exam  Constitutional:  Frail female, appears older than stated age.  Lying in bed, moaning.  HENT:  Head: Normocephalic and atraumatic.  Eyes: EOM are normal. Pupils are equal, round, and reactive to light. No scleral icterus.  Neck: Normal range of motion. No tracheal deviation present.  Cardiovascular: Regular rhythm and intact distal pulses.   Tachycardic.  Grade III/VI systolic crescendo-decrescendo murmur heard best at LUSB.  Pulmonary/Chest: Effort normal.  No adventitious breath sounds appreciated in frontal lung zones.  Abdominal: Soft. She exhibits no distension. There is no tenderness. There is no rebound and no guarding.  Musculoskeletal: She exhibits no edema.  Neurological:  Patient alert, but nonverbal.  Patient spontaneously moaning.  Cranial Nerves: PERRL. EOMI.  CN V, VII-XII unable to be assessed, as patient could not follow directions.  Spontaneous movement against gravity of all extremities.  Strength unable to be assessed.  Biceps and Patellar reflexes 2+ and symmetric.  Babinski negative bilaterally.  Skin: Skin is warm and dry.     Lab results: Basic Metabolic Panel:  Recent Labs  08/17/2015 1642 08/04/2015 1653  NA 137 137  K 2.8* 2.8*  CL 96* 94*  CO2 29  --   GLUCOSE 142* 138*  BUN 23* 25*  CREATININE 0.69 0.80  CALCIUM 9.5  --    Liver Function Tests:  Recent Labs  07/29/2015 1642  AST 32  ALT 13*  ALKPHOS 321*  BILITOT 0.9  PROT 5.4*  ALBUMIN 2.8*   No results for input(s): LIPASE, AMYLASE in the last 72 hours. No results for input(s): AMMONIA in the last 72  hours. CBC:  Recent Labs  08/09/2015 1642 08/13/2015 1653  WBC 6.1  --   NEUTROABS 4.7  --   HGB 9.9* 9.9*  HCT 30.8* 29.0*  MCV 89.0  --   PLT 30*  --    Cardiac Enzymes: No results for input(s): CKTOTAL, CKMB, CKMBINDEX, TROPONINI in the last 72 hours. BNP: No results for input(s): PROBNP in the last 72 hours. D-Dimer: No results for input(s): DDIMER in the last 72 hours. CBG:  Recent Labs  07/22/2015 1631  GLUCAP 130*   Hemoglobin A1C: No results for input(s): HGBA1C in the last 72 hours. Fasting Lipid Panel: No results for input(s): CHOL, HDL, LDLCALC, TRIG, CHOLHDL, LDLDIRECT in the last 72 hours. Thyroid Function Tests: No results for input(s): TSH, T4TOTAL, FREET4, T3FREE, THYROIDAB in the last 72 hours. Anemia Panel: No results for input(s): VITAMINB12, FOLATE, FERRITIN, TIBC, IRON, RETICCTPCT in the last 72 hours. Coagulation:  Recent Labs  08/09/2015 1642  LABPROT 17.1*  INR 1.39   Urine Drug Screen: Drugs of Abuse     Component Value Date/Time   LABOPIA POSITIVE* 08/07/2015 1852   COCAINSCRNUR NONE DETECTED 08/20/2015 1852   LABBENZ POSITIVE* 08/10/2015 1852   AMPHETMU NONE DETECTED 08/03/2015 1852   THCU POSITIVE* 08/13/2015 1852   LABBARB NONE DETECTED 08/16/2015 1852    Alcohol Level:  Recent Labs  08/15/2015 1642  ETH <5   Urinalysis:  Recent Labs  07/29/2015 1852  COLORURINE AMBER*  LABSPEC 1.016  PHURINE 8.5*  GLUCOSEU NEGATIVE  HGBUR NEGATIVE  BILIRUBINUR NEGATIVE  KETONESUR 15*  PROTEINUR 30*  UROBILINOGEN 1.0  NITRITE POSITIVE*  LEUKOCYTESUR LARGE*    Imaging results:  Ct Head Wo Contrast  08/07/2015   CLINICAL DATA:  Code stroke, seizure. Slurred speech. Brain tumor resection 2009  EXAM: CT HEAD WITHOUT CONTRAST  TECHNIQUE: Contiguous axial images were obtained from the base of the skull through the vertex without intravenous contrast.  COMPARISON:  06/15/2015  FINDINGS: Left frontal encephalomalacia reidentified. Mild cortical  volume loss noted with proportional ventricular prominence. Areas of periventricular white matter hypodensity are most compatible with small vessel ischemic change. No acute hemorrhage, infarct, or mass lesion is identified. No midline shift. Moderate sphenoid sinusitis. Left frontal craniotomy defect. Areas of relative subjective osteopenia are reidentified in the skull.  IMPRESSION: Chronic stable findings as above. No new acute finding. Critical Value/emergent results were called by telephone at the time of interpretation on 08/01/2015 at 4:31 pm to Dr. Janann Colonel, who verbally acknowledged these results.   Electronically Signed   By: Conchita Paris M.D.   On: 07/29/2015 16:33    Other results: EKG: sinus tachycardia, prolonged QT interval.  Assessment & Plan by Problem: Active Problems:   * No active hospital problems. *  Ms. Stachnik is a 64 yo female with metastatic lung cancer, HTN, Hep C, pancytopenia, and h/o left frontal brain tumor s/p resection in 2009.   Stroke: Patient exhibits continued right sided facial droop and remains nonverbal.  tPA deferred by neurology due to patient's pancytopenia and platelets of 30.  No evidence of hemorrhage on CT.  Patient's diffusely metastatic lung cancer with skull mets concerning for brain mets, though MRI in April 2016 was negative. EEG shows possible seizure focus.  Neurology recommended MRI for evaluation of acute stroke.  - MRI brain - Continue Keppra 500 mg BID - PT/OT/speech - Te[place]  UTI: UA with many bacteria, large leukocytes, and positive nitrites.  Daughter states patient's urine has been cloudy and  malodorous since discharge 8/17. - CTX  Pain: Patients current pain regimen is Oxy XR 40 mg q8hours and Oxy IR 10 mg q4hours PRN.  Her scheduled Oxy XR regimen is the equivalent of Dilaudid 9 mg/day.  Will start patient on Dialudid 1 mg q3hours for pain control (8 mg/day).  Patient's daughter refused offer of Palliative Care consult, but her refusal  was in the context of Hospice discussion.  Can consider Palliative Care consult in context of pain control in a nonverbal, diffusely metastatic patient. - Dilaudid 1 mg IV q3hours  Hypokalemia: K 2.8 on arrival to ED.  Daughter endorses N/V and constipation requiring frequent use of milk of magnesia.  Will hydrate and replete and monitor electrolytes. - IV potassium - Dulcolax PR for constipation  Metastatic Lung Cancer: Followed by Dr. Burr Medico at [facility].  Patient not a candidate for chemo.  She is s/p radiation to left pelvis (May 2016).  Patient had entered hospice, but daughter states that she then withdrew the patient from hospice for unclear reasons (stated as differences of opinion).  Recommend family counseling with possible Palliative Care consult. - Consider Palliative Care consult   Pancytopenia: Plt count has been decreasing since May 2016.  Currently holding steady at 20-30.  No evidence of current bleeding or hemorrhage. - CTM  HTN: Patient currently normotensive.  Daughter has stopped BP meds at home due to patient being normotensive.  Will hold meds at this time. - CTM  FEN/GI - NPO, pending swallow eval  DVT Ppx: contraindicated (plt = 30)  Dispo: Disposition is deferred at this time, awaiting improvement of current medical problems.   The patient does have a current PCP Orpah Greek Carmelia Roller, MD) and does need an [facility] hospital follow-up appointment after discharge.  The patient does not have transportation limitations that hinder transportation to clinic appointments.  Signed: Iline Oven, MD, PhD 08/06/2015, 8:18 PM

## 2015-07-28 NOTE — Progress Notes (Signed)
EEG Completed; Results Pending  

## 2015-07-28 NOTE — Consult Note (Signed)
Referring Physician: Alvino Chapel    Chief Complaint: Code stroke  HPI:                                                                                                                                         [name] [name] is an 64 y.o. female with known Lung Cancer, pancytopenia, seizure disorder. She is taken care of by her sone. Per EMS report she was in her shower and son noted she was not acting correctly.  He called her daughter who came home and then called EMS.  Patietn was non-verbal and showed a right facial droop on EMS arrival.  At this time no family is present, she has a right facial droop, moaning, moving all extremities and localizing to pain. She has a known seizure disorder and it is unclear if she may have had a seizure at home. She also has known Pancytopenia with last PLT count 13 on 07/12/2015. Current platelet count of 30.   Talking to daughter, she did have a brain tumor removed in 2009.  She was placed on Lamictal at this time but since then has had no seizures.  She was then taken off this medication. She tells me she did not she had defected her bed.   Date last known well: Date: 07/27/2015 Time last known well: Time: 14:00 tPA Given: No: low PLTS   Past Medical History  Diagnosis Date  . Hypertension   . Bipolar 1 disorder   . Heart murmur   . Sciatica   . Arthritis   . Anxiety   . Diverticulosis   . Chronic low back pain   . DDD (degenerative disc disease), lumbar   . Chronic shoulder pain   . Chronic neck pain   . Hepatitis B     Unclear when initially diagnosed, labs in Epic from 03/09/13  . Hepatitis C     Unclear when initially diagnosed, labs in Epic from 03/09/13  . C. difficile diarrhea     April and February 2014  . COPD (chronic obstructive pulmonary disease)   . GERD (gastroesophageal reflux disease)   . H/O hiatal hernia   . Headache(784.0)     hx  . PONV (postoperative nausea and vomiting)   . Seizures     on meds  . Diabetes mellitus   .  Adenocarcinoma of lung, stage 1   . GERD (gastroesophageal reflux disease)     Past Surgical History  Procedure Laterality Date  . Facial tumor removal Right 2000  . Brain tumor removal  09  . Brain surgery      in [place]  . Video bronchoscopy  12/04/2012    Procedure: VIDEO BRONCHOSCOPY;  Surgeon: Grace Isaac, MD;  Location: [facility] OR;  Service: Thoracic;  Laterality: N/A;  . Video assisted thoracoscopy (vats)/wedge resection  12/04/2012    Procedure: VIDEO ASSISTED THORACOSCOPY (VATS)/WEDGE RESECTION;  Surgeon: Grace Isaac, MD;  Location: [place];  Service: Thoracic;  Laterality: Right;  . Lobectomy  12/04/2012    Procedure: LOBECTOMY;  Surgeon: Grace Isaac, MD;  Location: [place];  Service: Thoracic;  Laterality: Right;  completion of right upper lobectomy and lymph node disection, placement of on q pump  . Tubal ligation    . Tonsillectomy    . Anterior cervical decomp/discectomy fusion N/A 03/05/2014    Procedure: ANTERIOR CERVICAL DECOMPRESSION/DISCECTOMY FUSION 2 LEVELS;  Surgeon: Sinclair Ship, MD;  Location: [place];  Service: Orthopedics;  Laterality: N/A;  Anterior cervical decompression fusion, cervical 4-5, cervical 5-6 with instrumentation, allograft.    Family History  Problem Relation Age of Onset  . Hypertension Father     deceased  . Diabetes Father   . Heart disease Father   . Hyperlipidemia Father   . Hypertension Mother   . Hyperlipidemia Mother   . Diabetes Mother   . Hypertension Sister   . Hyperlipidemia Sister   . Hypertension Brother   . Hyperlipidemia Brother    Social History:  reports that she quit smoking about 2 years ago. Her smoking use included Cigarettes. She has a 2.5 pack-year smoking history. She has never used smokeless tobacco. She reports that she uses illicit drugs (Hydrocodone and Marijuana). She reports that she does not drink alcohol.  Allergies:  Allergies  Allergen Reactions  . Haldol [Haloperidol Decanoate]  Swelling    tongue swelling  . Acetaminophen Other (See Comments)    Stomach pain  . Ibuprofen Other (See Comments)    Stomach pain  . Metronidazole Other (See Comments)    Severe stomach upset--was instructed to avoid Flagyl.  . Morphine And Related Hives    Tolerates hydromorphone.  Marland Kitchen Penicillins Rash    Medications:                                                                                                                           No current facility-administered medications for this encounter.   Current Outpatient Prescriptions  Medication Sig Dispense Refill  . ALPRAZolam (XANAX) 0.25 MG tablet Take 1 tablet (0.25 mg total) by mouth at bedtime as needed for anxiety. 10 tablet 0  . dicyclomine (BENTYL) 20 MG tablet Take 20 mg by mouth 3 (three) times daily with meals.    . DULoxetine (CYMBALTA) 30 MG capsule Take 1 capsule (30 mg total) by mouth daily. 30 capsule 0  . feeding supplement (BOOST / RESOURCE BREEZE) LIQD Take 1 Container by mouth 3 (three) times daily between meals.  0  . metoCLOPramide (REGLAN) 5 MG tablet Take 1 tablet (5 mg total) by mouth 3 (three) times daily before meals. 30 tablet 2  . metoprolol tartrate (LOPRESSOR) 25 MG tablet Take 25 mg by mouth daily.    Marland Kitchen omeprazole (PRILOSEC) 20 MG capsule Take 1 capsule (20 mg total) by mouth 2 (two) times daily before a meal. 60 capsule 5  .  ondansetron (ZOFRAN-ODT) 8 MG disintegrating tablet Take 1 tablet (8 mg total) by mouth every 6 (six) hours as needed for nausea or vomiting. (Patient taking differently: Take 8 mg by mouth daily as needed for nausea or vomiting. ) 20 tablet 2  . OXcarbazepine (TRILEPTAL) 150 MG tablet Take 150 mg by mouth 2 (two) times daily.     . OxyCODONE (OXYCONTIN) 20 mg T12A 12 hr tablet Take 2 tablets (40 mg total) by mouth every 8 (eight) hours. 120 tablet 0  . Oxycodone HCl 10 MG TABS Take 1 tablet (10 mg total) by mouth every 4 (four) hours as needed (moderate pain, severe pain,  breakthrough pain). 90 tablet 0  . Sennosides-Docusate Sodium (SENNA S PO) Take 2 tablets by mouth 2 (two) times daily.  1  . simvastatin (ZOCOR) 20 MG tablet Take 20 mg by mouth at bedtime.        ROS:                                                                                                                                       History obtained from unobtainable from patient due to mental status   Neurologic Examination:                                                                                                      There were no vitals taken for this visit.  HEENT-  Normocephalic, no lesions, without obvious abnormality.  Normal external eye and conjunctiva.  Normal TM's bilaterally.  Normal auditory canals and external ears. Normal external nose, mucus membranes and septum.  Normal pharynx. Cardiovascular- S1, S2 normal, pulses palpable throughout   Lungs- chest clear, no wheezing, rales, normal symmetric air entry Abdomen- soft, non-tender; bowel sounds normal; no masses,  no organomegaly Extremities- no edema Lymph-no adenopathy palpable Musculoskeletal-no joint tenderness, deformity or swelling Skin-warm and dry, no hyperpigmentation, vitiligo, or suspicious lesions  Neurological Examination Mental Status: Alert, moaning with no verbal out put. Follows no commands.  Cranial Nerves: II: Discs flat bilaterally; blinks to threat bilaterally, pupils equal, round, reactive to light and accommodation III,IV, VI: ptosis not present, extra-ocular motions intact bilaterally V,VII: face shows a right facial droop, facial light touch sensation normal bilaterally VIII: hearing normal bilaterally IX,X: uvula rises symmetrically XI: bilateral shoulder shrug XII: midline tongue extension Motor: Moving all extremities antigravity with weakness of right arm compared to left  Sensory: Pinprick and light touch intact throughout, bilaterally Deep Tendon Reflexes: 2+  and symmetric  throughout Plantars: Right: downgoing   Left: downgoing Cerebellar: No able to assess Gait: not able to assess       Lab Results: Basic Metabolic Panel: No results for input(s): NA, K, CL, CO2, GLUCOSE, BUN, CREATININE, CALCIUM, MG, PHOS in the last 168 hours.  Liver Function Tests: No results for input(s): AST, ALT, ALKPHOS, BILITOT, PROT, ALBUMIN in the last 168 hours. No results for input(s): LIPASE, AMYLASE in the last 168 hours. No results for input(s): AMMONIA in the last 168 hours.  CBC: No results for input(s): WBC, NEUTROABS, HGB, HCT, MCV, PLT in the last 168 hours.  Cardiac Enzymes: No results for input(s): CKTOTAL, CKMB, CKMBINDEX, TROPONINI in the last 168 hours.  Lipid Panel: No results for input(s): CHOL, TRIG, HDL, CHOLHDL, VLDL, LDLCALC in the last 168 hours.  CBG: No results for input(s): GLUCAP in the last 168 hours.  Microbiology: Results for orders placed or performed in visit on 07/12/15  TECHNOLOGIST REVIEW     Status: None   Collection Time: 07/12/15 12:16 PM  Result Value Ref Range Status   Technologist Review   Final    Metas and Myelocytes present, few fragments and helmets    Coagulation Studies: No results for input(s): LABPROT, INR in the last 72 hours.  Imaging: No results found.     Assessment and plan discussed with with attending physician and they are in agreement.    [name] Quill PA-C [facility] [phone]  08/06/2015, 4:32 PM   Assessment: 64 y.o. female presenting to ED with AMS and exam showing right facial droop and right arm weakness compared to left. CT head showed no acute changes. Patient has known pancytopenia with last platelet count of 13 on 07/12/2015.  tPA not given due to low platelets. Differential includes more distal MCA infarct vs possible seizure with post-ictal state.   -MRI brain. If positive would complete stroke workup -EEG formal read pending -continue lamictal at home dose. Added  keppra '500mg'$  BID with load in ED -metabolic and infectious workup -will continue to follow   Jim Like, DO [facility] [phone]  If 7pm- 7am, please page neurology on call as listed in [place].

## 2015-07-28 NOTE — Telephone Encounter (Signed)
Patient dtr called to reschedule 9/6 appointments. dtr given appointments for lab/YF 9/14 @ 12 pm.

## 2015-07-28 NOTE — ED Notes (Signed)
See stroke nurse note for triage note

## 2015-07-28 NOTE — ED Provider Notes (Signed)
CSN: [id number]     Arrival date & time 08/03/2015  1609 History   First MD Initiated Contact with Patient 08/09/2015 1613     Chief Complaint  Patient presents with  . Code Stroke   [name] a 64 y.o. female presenting with general illness. The history is provided by the EMS personnel and medical records. The history is limited by the condition of the patient. No language interpreter was used.  Illness Location:  NA Quality:  AMS, ? of facial droop Severity:  Unable to specify Onset quality:  Unable to specify Duration: unknown. 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 Question of facial droop per EMS. Code stroke called and neurology present initial valuation. Patient taken to CT scan. Upon chart review, patient seen in ED approximately x1 month ago (07/04/15-07/07/15) with AMS at which time she was found to have an AKI & uremia w/o overt infection & patient was admitted to the hospital    Past Medical History  Diagnosis Date  . Hypertension   . Bipolar 1 disorder   . Heart murmur   . Sciatica   . Arthritis   . Anxiety   . Diverticulosis   . Chronic low back pain   . DDD (degenerative disc disease), lumbar   . Chronic shoulder pain   . Chronic neck pain   . Hepatitis B     Unclear when initially diagnosed, labs in Epic from 03/09/13  . Hepatitis C     Unclear when initially diagnosed, labs in Epic from 03/09/13  . C. difficile diarrhea     April and February 2014  . COPD (chronic obstructive pulmonary disease)   . GERD (gastroesophageal reflux disease)   . H/O hiatal hernia   . Headache(784.0)     hx  . PONV (postoperative nausea and vomiting)   . Seizures     on meds  . Diabetes mellitus   . Adenocarcinoma of lung, stage 1   . GERD (gastroesophageal reflux disease)    Past Surgical History  Procedure  Laterality Date  . Facial tumor removal Right 2000  . Brain tumor removal  09  . Brain surgery      in [place]  . Video bronchoscopy  12/04/2012    Procedure: VIDEO BRONCHOSCOPY;  Surgeon: Grace Isaac, MD;  Location: [facility] OR;  Service: Thoracic;  Laterality: N/A;  . Video assisted thoracoscopy (vats)/wedge resection  12/04/2012    Procedure: VIDEO ASSISTED THORACOSCOPY (VATS)/WEDGE RESECTION;  Surgeon: Grace Isaac, MD;  Location: [facility];  Service: Thoracic;  Laterality: Right;  . Lobectomy  12/04/2012    Procedure: LOBECTOMY;  Surgeon: Grace Isaac, MD;  Location: [facility];  Service: Thoracic;  Laterality: Right;  completion of right upper lobectomy and lymph node disection, placement of on q pump  . Tubal ligation    . Tonsillectomy    . Anterior cervical decomp/discectomy fusion N/A 03/05/2014    Procedure: ANTERIOR CERVICAL DECOMPRESSION/DISCECTOMY FUSION 2 LEVELS;  Surgeon: Sinclair Ship, MD;  Location: [facility];  Service: Orthopedics;  Laterality: N/A;  Anterior cervical decompression fusion, cervical 4-5, cervical 5-6 with instrumentation, allograft.   Family History  Problem Relation Age of Onset  . Hypertension Father     deceased  . Diabetes Father   . Heart disease Father   . Hyperlipidemia Father   .  Hypertension Mother   . Hyperlipidemia Mother   . Diabetes Mother   . Hypertension Sister   . Hyperlipidemia Sister   . Hypertension Brother   . Hyperlipidemia Brother    Social History  Substance Use Topics  . Smoking status: Former Smoker -- 0.25 packs/day for 10 years    Types: Cigarettes    Quit date: 07/07/2013  . Smokeless tobacco: Never Used     Comment: Smokes pk q 3 days   marajuna aug  . Alcohol Use: No     Comment: quit 29yr ago   OB History    No data available      Review of Systems  Unable to perform ROS: Patient nonverbal  Neurological: Positive for facial asymmetry.       AMS    Allergies  Haldol; Acetaminophen; Ibuprofen;  Metronidazole; Morphine and related; and Penicillins  Home Medications   Prior to Admission medications   Medication Sig Start Date End Date Taking? Authorizing Provider  ALPRAZolam (XANAX) 0.25 MG tablet Take 1 tablet (0.25 mg total) by mouth at bedtime as needed for anxiety. 07/07/15  Yes OVelvet Bathe MD  magnesium hydroxide (MILK OF MAGNESIA) 800 MG/5ML suspension Take 30 mLs by mouth daily as needed for constipation.   Yes Historical Provider, MD  Menthol-Methyl Salicylate (MUSCLE RUB) 10-15 % CREA Apply 1 application topically daily as needed for muscle pain.   Yes Historical Provider, MD  OxyCODONE (OXYCONTIN) 20 mg T12A 12 hr tablet Take 2 tablets (40 mg total) by mouth every 8 (eight) hours. 07/12/15  Yes YTruitt Merle MD  Oxycodone HCl 10 MG TABS Take 1 tablet (10 mg total) by mouth every 4 (four) hours as needed (moderate pain, severe pain, breakthrough pain). 07/12/15  Yes YTruitt Merle MD  Sennosides-Docusate Sodium (SENNA S PO) Take 2 tablets by mouth 2 (two) times daily. 06/21/15  Yes Historical Provider, MD  DULoxetine (CYMBALTA) 30 MG capsule Take 1 capsule (30 mg total) by mouth daily. Patient not taking: Reported on 08/19/2015 05/06/15   AModena Jansky MD  feeding supplement (BOOST / RESOURCE BREEZE) LIQD Take 1 Container by mouth 3 (three) times daily between meals. Patient not taking: Reported on 08/20/2015 07/07/15   OVelvet Bathe MD  metoCLOPramide (REGLAN) 5 MG tablet Take 1 tablet (5 mg total) by mouth 3 (three) times daily before meals. Patient not taking: Reported on 08/16/2015 03/12/15   GOswald Hillock MD  omeprazole (PRILOSEC) 20 MG capsule Take 1 capsule (20 mg total) by mouth 2 (two) times daily before a meal. Patient not taking: Reported on 08/01/2015 03/26/15   MTyler Pita MD  ondansetron (ZOFRAN-ODT) 8 MG disintegrating tablet Take 1 tablet (8 mg total) by mouth every 6 (six) hours as needed for nausea or vomiting. Patient not taking: Reported on 08/12/2015 04/28/15   YTruitt Merle MD    BP 151/92 mmHg  Pulse 124  Temp(Src) 98.8 F (37.1 C) (Oral)  Resp 16  Wt 96 lb 12.5 oz (43.9 kg)  SpO2 100%   Physical Exam  Constitutional: No distress.  HENT:  Head: Normocephalic and atraumatic.  Eyes: Conjunctivae are normal. Pupils are equal, round, and reactive to light.  Neck: Normal range of motion. Neck supple.  Cardiovascular:  Tachycardic to 120s, normotensive  Pulmonary/Chest: Effort normal and breath sounds normal. No respiratory distress.  Abdominal: Soft. Bowel sounds are normal. She exhibits no distension.  Musculoskeletal: Normal range of motion.  Neurological: She is alert.  Alert, nonverbal - will moan with  pain and when asked questions, localized pain, opens eyes spontaneously and tracks appropriately  Skin: Skin is warm and dry. She is not diaphoretic.  Nursing note and vitals reviewed.   ED Course  Procedures   Labs Review Labs Reviewed  PROTIME-INR - Abnormal; Notable for the following:    Prothrombin Time 17.1 (*)    All other components within normal limits  CBC - Abnormal; Notable for the following:    RBC 3.46 (*)    Hemoglobin 9.9 (*)    HCT 30.8 (*)    RDW 19.5 (*)    Platelets 30 (*)    All other components within normal limits  DIFFERENTIAL - Abnormal; Notable for the following:    Band Neutrophils 12 (*)    nRBC 20 (*)    All other components within normal limits  COMPREHENSIVE METABOLIC PANEL - Abnormal; Notable for the following:    Potassium 2.8 (*)    Chloride 96 (*)    Glucose, Bld 142 (*)    BUN 23 (*)    Total Protein 5.4 (*)    Albumin 2.8 (*)    ALT 13 (*)    Alkaline Phosphatase 321 (*)    All other components within normal limits  URINE RAPID DRUG SCREEN, HOSP PERFORMED - Abnormal; Notable for the following:    Opiates POSITIVE (*)    Benzodiazepines POSITIVE (*)    Tetrahydrocannabinol POSITIVE (*)    All other components within normal limits  URINALYSIS, ROUTINE W REFLEX MICROSCOPIC (NOT AT [facility]) - Abnormal;  Notable for the following:    Color, Urine AMBER (*)    APPearance TURBID (*)    pH 8.5 (*)    Ketones, ur 15 (*)    Protein, ur 30 (*)    Nitrite POSITIVE (*)    Leukocytes, UA LARGE (*)    All other components within normal limits  URINE MICROSCOPIC-ADD ON - Abnormal; Notable for the following:    Bacteria, UA MANY (*)    All other components within normal limits  CBG MONITORING, ED - Abnormal; Notable for the following:    Glucose-Capillary 130 (*)    All other components within normal limits  I-STAT CHEM 8, ED - Abnormal; Notable for the following:    Potassium 2.8 (*)    Chloride 94 (*)    BUN 25 (*)    Glucose, Bld 138 (*)    Hemoglobin 9.9 (*)    HCT 29.0 (*)    All other components within normal limits  APTT  ETHANOL  LAMOTRIGINE LEVEL  BASIC METABOLIC PANEL  CBC  LIPID PANEL  HEMOGLOBIN A1C  I-STAT TROPOININ, ED   Imaging Review Ct Head Wo Contrast  08/08/2015   CLINICAL DATA:  Code stroke, seizure. Slurred speech. Brain tumor resection 2009  EXAM: CT HEAD WITHOUT CONTRAST  TECHNIQUE: Contiguous axial images were obtained from the base of the skull through the vertex without intravenous contrast.  COMPARISON:  06/15/2015  FINDINGS: Left frontal encephalomalacia reidentified. Mild cortical volume loss noted with proportional ventricular prominence. Areas of periventricular white matter hypodensity are most compatible with small vessel ischemic change. No acute hemorrhage, infarct, or mass lesion is identified. No midline shift. Moderate sphenoid sinusitis. Left frontal craniotomy defect. Areas of relative subjective osteopenia are reidentified in the skull.  IMPRESSION: Chronic stable findings as above. No new acute finding. Critical Value/emergent results were called by telephone at the time of interpretation on 08/01/2015 at 4:31 pm to Dr. Janann Colonel, who verbally acknowledged  these results.   Electronically Signed   By: Conchita Paris M.D.   On: 08/17/2015 16:33   Mr [name] Head  Wo Contrast  07/26/2015   CLINICAL DATA:  Acute onset of right-sided facial droop and aphasia. Known seizure disorder. Personal history of brain tumor.  EXAM: MRI HEAD WITHOUT CONTRAST  MRA HEAD WITHOUT CONTRAST  TECHNIQUE: Multiplanar, multiecho pulse sequences of the brain and surrounding structures were obtained without intravenous contrast. Angiographic images of the head were obtained using MRA technique without contrast.  COMPARISON:  CT head without contrast 07/24/2015. MRI brain 02/26/2015.  FINDINGS: MRI HEAD FINDINGS  The diffusion-weighted images demonstrate acute nonhemorrhagic infarct involving the left insular cortex and frontal operculum. No acute hemorrhage or mass lesion is present. Minimal T2 changes are associated with the acute infarct. Or remote encephalomalacia in the anterior left frontal lobe is again noted.  Mild periventricular T2 changes are otherwise stable.  Flow present in the major intracranial arteries. Bilateral lens replacements are noted. Extensive mucosal thickening is present in the left sphenoid sinus and to lesser extent the right sphenoid sinus. The remaining paranasal sinuses and the mastoid air cells are clear.  Remote blood products are associated with the left frontal craniotomy. Extensive osseous metastases are noted in the calvarium.  MRA HEAD FINDINGS  The internal carotid arteries demonstrate minimal atherosclerotic changes the cavernous segments bilaterally, left greater than right. Mild atherosclerotic irregularity is present within the A1 segments bilaterally. The distal left M1 occlusion is present. And early inferior left MCA branch is maintained. There is mild distal attenuation of MCA and ACA branch vessels.  The right vertebral artery bifurcates at the PICA ascending a small branch to the basilar artery. The left vertebral artery demonstrates a high-grade stenosis just beyond the dura in terminates and a small left PICA. 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 This may be the termination of the native occluded left vertebral artery. A distal left vertebral stump is noted. The basilar artery is small. Both posterior cerebral arteries originate from the basilar tip. There is a moderate proximal stenosis on the right. A left posterior communicating artery contributes. Moderate attenuation of PCA branch vessels is worse on the right.  IMPRESSION: 1. Acute nonhemorrhagic infarct involving the left insular cortex and frontal operculum. 2. The distal left M1 segment is occluded with a single early anterior inferior MCA branch vessel preserved. 3. 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 4. The right vertebral artery is small sudden only a small branch to the basilar artery. 5. Moderate stenosis of the proximal right P1 segment. 6. Diffuse small vessel disease. 7. Encephalomalacia in the anterior left frontal lobe associated with a left frontal craniotomy. 8. 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 These results were called by telephone at the time of interpretation on 07/24/2015 at 9:40 pm to Dr. Davonna [name] , who verbally acknowledged these results.   Electronically Signed   By: San Morelle M.D.   On: 08/06/2015 21:40   Mr Brain Wo Contrast  08/08/2015   CLINICAL DATA:  Acute onset of right-sided facial droop and aphasia. Known seizure disorder. Personal history of brain tumor.  EXAM: MRI HEAD WITHOUT CONTRAST  MRA HEAD WITHOUT CONTRAST  TECHNIQUE: Multiplanar, multiecho pulse sequences of the brain and surrounding structures were obtained without intravenous contrast. Angiographic images of the head were obtained using MRA technique without  contrast.  COMPARISON:  CT head without contrast 08/18/2015. MRI brain 02/26/2015.  FINDINGS: MRI HEAD FINDINGS  The diffusion-weighted images demonstrate acute nonhemorrhagic infarct involving  the left insular cortex and frontal operculum. No acute hemorrhage or mass lesion is present. Minimal T2 changes are associated with the acute infarct. Or remote encephalomalacia in the anterior left frontal lobe is again noted.  Mild periventricular T2 changes are otherwise stable.  Flow present in the major intracranial arteries. Bilateral lens replacements are noted. Extensive mucosal thickening is present in the left sphenoid sinus and to lesser extent the right sphenoid sinus. The remaining paranasal sinuses and the mastoid air cells are clear.  Remote blood products are associated with the left frontal craniotomy. Extensive osseous metastases are noted in the calvarium.  MRA HEAD FINDINGS  The internal carotid arteries demonstrate minimal atherosclerotic changes the cavernous segments bilaterally, left greater than right. Mild atherosclerotic irregularity is present within the A1 segments bilaterally. The distal left M1 occlusion is present. And early inferior left MCA branch is maintained. There is mild distal attenuation of MCA and ACA branch vessels.  The right vertebral artery bifurcates at the PICA ascending a small branch to the basilar artery. The left vertebral artery demonstrates a high-grade stenosis just beyond the dura in terminates and a small left PICA. 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 This may be the termination of the native occluded left vertebral artery. A distal left vertebral stump is noted. The basilar artery is small. Both posterior cerebral arteries originate from the basilar tip. There is a moderate proximal stenosis on the right. A left posterior communicating artery contributes. Moderate attenuation of PCA branch vessels is worse on the right.  IMPRESSION: 1. Acute nonhemorrhagic infarct involving the left insular cortex and frontal operculum. 2. The distal left M1 segment is occluded with a single early anterior inferior MCA  branch vessel preserved. 3. 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 4. The right vertebral artery is small sudden only a small branch to the basilar artery. 5. Moderate stenosis of the proximal right P1 segment. 6. Diffuse small vessel disease. 7. Encephalomalacia in the anterior left frontal lobe associated with a left frontal craniotomy. 8. 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 These results were called by telephone at the time of interpretation on 08/15/2015 at 9:40 pm to Dr. Davonna [name] , who verbally acknowledged these results.   Electronically Signed   By: San Morelle M.D.   On: 08/12/2015 21:40   I have personally reviewed and evaluated these images and lab results as part of my medical decision-making.   EKG Interpretation   Date/Time:  Wednesday July 28 2015 16:32:22 EDT Ventricular Rate:  128 PR Interval:  131 QRS Duration: 71 QT Interval:  347 QTC Calculation: 506 R Axis:   93 Text Interpretation:  Sinus tachycardia Probable left atrial enlargement  Right axis deviation Consider left ventricular hypertrophy Anterior Q  waves, possibly due to LVH Nonspecific T abnormalities, lateral leads  Prolonged QT interval Confirmed by Alvino Chapel  MD, [name] [name] [id number]) on  08/15/2015 4:42:39 PM      MDM  [name] [name] is a 64 yo female with PMHx of HTN, seizures (taking Lamictal), & metastatic lung adenocarcinoma s/p recent spine metastasis (no chemo or radiation) presenting via EMS with AMS. Son was bathing patient and noticed her to be less responsive so called daughter who  contacted EMS. Question of facial droop per EMS so code stroke called and neurology present initial valuation. Patient taken to CT scan. Upon chart review, patient seen in ED approximately x1 month ago (07/04/15-07/07/15) with AMS at which time she was found to have an AKI & uremia w/o overt infection & patient was admitted to the  hospital with a palliative consult (occurred after a discussion with daughter who no longer wished patient to be in hospice). AMS at that time improved with hydration & discontinuation of sedating medications.  Elderly female lying in stretcher in no acute distress. Tachycardic to 120s. Normotensive. Breathing well on room air and maintaining saturations without supplemental oxygen. Alert, nonverbal - will moan with pain and when asked questions, localized pain, opens eyes spontaneously and tracks appropriately.  Glucose 130. Sodium 137. Potassium 2.8. Creatinine 0.8. BUN 25. UA positive for infection. UDS positive for opiates, benzos, and THC. CT head showing left frontal encephalomalacia and left frontal craniotomy defect but no acute hemorrhage, infarct, or mass lesion. MRI notable for acute nonhemorrhagic infarct involving the left insular cortex and frontal operculum.  Patient admitted to hospitalist service for further evaluation and management of AMS and facial droop with concern for CVA. Neurology will continue to follow given MRI results. Patient's family understands and agrees with plan and has no further questions concerning this time.  Pt care discussed with and followed by my attending, Dr. Davonna [name]   Final diagnoses:  Stroke  CVA (cerebral infarction)    [name] Camel, MD 07/29/15 [id number]  [name] Belling, MD 07/29/15 2140

## 2015-07-28 NOTE — ED Provider Notes (Signed)
MSE was initiated and I personally evaluated the patient and placed orders (if any) at  4:16 PM on July 28, 2015.  The patient appears stable so that the remainder of the MSE may be completed by another provider.  Patient seen upon arrival at the bridge. Breathing spontaneously. Will not speak. Will not follow commands. Also seen by neurology. Taken to CT scan.  Davonna Belling, MD 08/20/2015 [phone]

## 2015-07-28 NOTE — Procedures (Signed)
History: 64 yo F with aphasia and right facial droop.   Sedation: alprazolam qhs  Technique: This is a 19 channel routine scalp EEG performed at the bedside with bipolar and monopolar montages arranged in accordance to the international 10/20 system of electrode placement. One channel was dedicated to EKG recording.    Background: The background is asymmetric with faster frequencies better seen on the right than left.. There is a posterior dominant rhythm of 8 Hz that is much better visualized on the right than left. There is excess beta activity that is seen only on the right side, maximum at C4, P4 > F4. I do not think there is any clear evolution to this pattern and it does not appear ictal to me. There are relatively frequent sharply contoured waves in the same distribution, and sometimes they do appear to disrupt the background suggesting an epileptiform nature to these intermittent discharges. There is also slower activity in the left frontal region.  Photic stimulation: Physiologic driving is not performed  EEG Abnormalities: 1) asymmetric fast activity 2) right central parietal sharp waves 3) left frontal slow activity  Clinical Interpretation: This EEG recorded potential evidence of a possible seizure focus in the right central parietal region. One explanation for the beta activity would be that this is medication effect due to her benzodiazepine which is not seen on the contralateral side due to her known parenchymal disease. This can sometimes cause discharges to appear sharply contoured, however there are times where the occasional sharp wave discharges do appear to disrupt the background and therefore I think there is a distinct possibility that they are epileptiform in nature.  There is also evidence of a left frontal dysfunction.  There is no seizure recorded.   Roland Rack, MD [facility] [phone]  If 7pm- 7am, please page neurology on call as listed  in [place].

## 2015-07-29 ENCOUNTER — Inpatient Hospital Stay (HOSPITAL_COMMUNITY): Payer: Medicare HMO

## 2015-07-29 DIAGNOSIS — C7951 Secondary malignant neoplasm of bone: Secondary | ICD-10-CM

## 2015-07-29 DIAGNOSIS — C3411 Malignant neoplasm of upper lobe, right bronchus or lung: Secondary | ICD-10-CM

## 2015-07-29 DIAGNOSIS — I69392 Facial weakness following cerebral infarction: Secondary | ICD-10-CM

## 2015-07-29 DIAGNOSIS — I69322 Dysarthria following cerebral infarction: Secondary | ICD-10-CM

## 2015-07-29 DIAGNOSIS — E876 Hypokalemia: Secondary | ICD-10-CM

## 2015-07-29 DIAGNOSIS — I639 Cerebral infarction, unspecified: Secondary | ICD-10-CM | POA: Insufficient documentation

## 2015-07-29 DIAGNOSIS — I69320 Aphasia following cerebral infarction: Secondary | ICD-10-CM

## 2015-07-29 DIAGNOSIS — I6789 Other cerebrovascular disease: Secondary | ICD-10-CM

## 2015-07-29 DIAGNOSIS — T451X5A Adverse effect of antineoplastic and immunosuppressive drugs, initial encounter: Secondary | ICD-10-CM

## 2015-07-29 DIAGNOSIS — Z87891 Personal history of nicotine dependence: Secondary | ICD-10-CM

## 2015-07-29 DIAGNOSIS — D6959 Other secondary thrombocytopenia: Secondary | ICD-10-CM

## 2015-07-29 DIAGNOSIS — C3490 Malignant neoplasm of unspecified part of unspecified bronchus or lung: Secondary | ICD-10-CM | POA: Insufficient documentation

## 2015-07-29 DIAGNOSIS — B182 Chronic viral hepatitis C: Secondary | ICD-10-CM

## 2015-07-29 DIAGNOSIS — G893 Neoplasm related pain (acute) (chronic): Secondary | ICD-10-CM

## 2015-07-29 DIAGNOSIS — E119 Type 2 diabetes mellitus without complications: Secondary | ICD-10-CM

## 2015-07-29 DIAGNOSIS — D630 Anemia in neoplastic disease: Secondary | ICD-10-CM

## 2015-07-29 DIAGNOSIS — D61810 Antineoplastic chemotherapy induced pancytopenia: Secondary | ICD-10-CM

## 2015-07-29 DIAGNOSIS — I63412 Cerebral infarction due to embolism of left middle cerebral artery: Principal | ICD-10-CM

## 2015-07-29 DIAGNOSIS — D696 Thrombocytopenia, unspecified: Secondary | ICD-10-CM | POA: Diagnosis present

## 2015-07-29 LAB — BASIC METABOLIC PANEL
Anion gap: 11 (ref 5–15)
BUN: 21 mg/dL — AB (ref 6–20)
CHLORIDE: 99 mmol/L — AB (ref 101–111)
CO2: 26 mmol/L (ref 22–32)
Calcium: 9.1 mg/dL (ref 8.9–10.3)
Creatinine, Ser: 0.63 mg/dL (ref 0.44–1.00)
GFR calc Af Amer: >60 mL/min (ref >60)
GLUCOSE: 95 mg/dL (ref 65–99)
POTASSIUM: 3.7 mmol/L (ref 3.5–5.1)
Sodium: 136 mmol/L (ref 135–145)

## 2015-07-29 LAB — CBC
HEMATOCRIT: 28.1 % — AB (ref 36.0–46.0)
Hemoglobin: 9 g/dL — ABNORMAL LOW (ref 12.0–15.0)
MCH: 28.7 pg (ref 26.0–34.0)
MCHC: 32 g/dL (ref 30.0–36.0)
MCV: 89.5 fL (ref 78.0–100.0)
Platelets: 24 10*3/uL — CL (ref 150–400)
RBC: 3.14 MIL/uL — ABNORMAL LOW (ref 3.87–5.11)
RDW: 19.6 % — AB (ref 11.5–15.5)
WBC: 4.8 10*3/uL (ref 4.0–10.5)

## 2015-07-29 LAB — LIPID PANEL
CHOL/HDL RATIO: 2.8 ratio
CHOLESTEROL: 119 mg/dL (ref 0–200)
HDL: 43 mg/dL (ref >40)
LDL CALC: 50 mg/dL (ref 0–99)
TRIGLYCERIDES: 130 mg/dL (ref <150)
VLDL: 26 mg/dL (ref 0–40)

## 2015-07-29 LAB — MAGNESIUM: MAGNESIUM: 2.1 mg/dL (ref 1.7–2.4)

## 2015-07-29 LAB — GLUCOSE, CAPILLARY
GLUCOSE-CAPILLARY: 97 mg/dL (ref 65–99)
Glucose-Capillary: 84 mg/dL (ref 65–99)

## 2015-07-29 LAB — LAMOTRIGINE LEVEL: Lamotrigine Lvl: NOT DETECTED ug/mL (ref 2.0–20.0)

## 2015-07-29 MED ORDER — SODIUM CHLORIDE 0.9 % IJ SOLN
10.0000 mL | INTRAMUSCULAR | Status: DC | PRN
  Administered 2015-07-29 – 2015-07-30 (×2): 10 mL
  Administered 2015-08-01: 20 mL
  Filled 2015-07-29 (×3): qty 40

## 2015-07-29 MED ORDER — HYDROMORPHONE HCL 1 MG/ML IJ SOLN
1.0000 mg | Freq: Once | INTRAMUSCULAR | Status: AC
  Administered 2015-07-29: 1 mg via INTRAVENOUS
  Filled 2015-07-29: qty 1

## 2015-07-29 NOTE — Progress Notes (Signed)
Pt arrived from ED to room 5C19.  Pt is alert, but has incomprehensible speech.  Pt nods to questions appropriately.  Tele applied and safety measures in place.  Will continue to monitor.   Fredrich Romans, RN

## 2015-07-29 NOTE — Progress Notes (Signed)
Pt HR has been consistently elevated from 120-140.  MD notified and ordered additional dilaudid.  Will continue to monitor.   Fredrich Romans, RN

## 2015-07-29 NOTE — Progress Notes (Signed)
RT nasotracheal suctioned pt due to a large amount of secretions pt wasn't able to clear on her own. RN with RT at bedside. RT will continue to monitor.

## 2015-07-29 NOTE — Progress Notes (Signed)
PT Cancellation Note  Patient Details Name: [name] [name] MRN: [id number] DOB: 1951/08/21   Cancelled Treatment:    Reason Eval/Treat Not Completed: Patient at procedure or test/unavailable Patient currently having in-room procedure with ultrasound. Will follow-up for PT evaluation.  Ellouise Newer 07/29/2015, 11:52 AM Elayne Snare, [place]

## 2015-07-29 NOTE — Progress Notes (Signed)
[name] NPO with no orders for blood sugar checks will notify attending.

## 2015-07-29 NOTE — Progress Notes (Signed)
Utilization review completed. Zarra Geffert, RN, BSN. 

## 2015-07-29 NOTE — Progress Notes (Signed)
PT Cancellation Note  Patient Details Name: [name] [name] MRN: [id number] DOB: 17-Mar-1951   Cancelled Treatment:    Reason Eval/Treat Not Completed: Pain limiting ability to participate Following-up from AM attempt to evaluate patient. Mrs. Neiswonger currently declines to work with PT at this time. Explained importance of being OOB to prevent secondary complications, and to allow PT evaluation in order to provide follow-up recommendations. Continues to decline, reporting that she is in pain, pointing to her anterior neck region. RN notified. Will attempt PT evaluation tomorrow.  Ellouise Newer 07/29/2015, 3:50 PM Camille Bal [place], [place]

## 2015-07-29 NOTE — Progress Notes (Signed)
Initial Nutrition Assessment  DOCUMENTATION CODES:   Severe malnutrition in context of chronic illness  INTERVENTION:  Ensure Enlive po BID, each supplement provides 350 kcal and 20 grams of protein, following diet advancement.    NUTRITION DIAGNOSIS:   Malnutrition related to chronic illness, inability to eat as evidenced by percent weight loss, moderate depletion of body fat, moderate depletions of muscle mass, severe depletion of body fat.    GOAL:   Patient will meet greater than or equal to 90% of their needs    MONITOR:   PO intake, Supplement acceptance, Diet advancement, Weight trends, Labs, I & O's  REASON FOR ASSESSMENT:   Consult Assessment of nutrition requirement/status  ASSESSMENT:   64 y.o aa female with primary lung cancer, osseous mets, unable to tolerate chemo due to poor functional status, DM2, seizures, hep B & C. Admitted for CVA, pt presents with constant verbal tremors, unable to respond to most questions outside of head movements.  Pt could recount no history. NFPE, pt appears severely malnourished, with severe loss of fat and  34#/26% wt loss within 3.5 Months. 8#/7% loss within 2 weeks. Likely loss due to inability to eat & increased nutrient needs following CVA. Possible Dysphagia, pt is NPO as of now, but likely requires speech therapy consult.  Provide Ensure Enlive BID to assist with meeting nutrient needs, following diet advancement.  Diet Order:  Diet NPO time specified  Skin:  Reviewed, no issues  Last BM:  None to date.  Height:   Ht Readings from Last 1 Encounters:  07/12/15 '5\' 4"'$  (1.626 m)    Weight:   Wt Readings from Last 1 Encounters:  08/06/2015 96 lb 12.5 oz (43.9 kg)    Ideal Body Weight:  54.5 kg  BMI:  Body mass index is 16.6 kg/(m^2).  Estimated Nutritional Needs:   Kcal:  1700-1900  Protein:  75-90g  Fluid:  >=/ 1.7L  EDUCATION NEEDS:   No education needs identified at this time  Satira Anis. Caera Enwright, MS,  RD LDN After Hours/Weekend Pager [phone]

## 2015-07-29 NOTE — Progress Notes (Signed)
Occupational Therapy Evaluation Patient Details Name: [name] [name] MRN: [id number] DOB: [birth year] Today's Date: 07/29/2015    History of Present Illness Pt with PMH of metastatic lung cancer, HTN, Hep C, pancytopenia, Left frontal brain tumor s/p resection p/w right facial droop, dysarthria and difficulty comprehending.   Clinical Impression   Pt admitted with the above diagnoses and presents with below problem list. Pt will benefit from continued acute OT to address the below listed deficits and maximize independence with BADLs prior to d/c to venue below. PTA pt was mod I to supervision with ADLs. Pt is currently min A for LB ADLs, transfers, and functional mobility. Session details below. Daughter present for part of evaluation and expressed concern for level of care she can continue to provide in home with current level of assist needs (see below for further details). OT to continue to follow acutely.      Follow Up Recommendations  SNF;Supervision/Assistance - 24 hour    Equipment Recommendations  Other (comment) (defer to next venue)    Recommendations for Other Services PT consult     Precautions / Restrictions Precautions Precautions: Fall;Other (comment) Precaution Comments: Severe aspiration risk Restrictions Weight Bearing Restrictions: No      Mobility Bed Mobility Overal bed mobility: Needs Assistance Bed Mobility: Rolling;Sidelying to Sit;Sit to Sidelying Rolling: Min assist;Min guard Sidelying to sit: Min assist       General bed mobility comments: min A to complete full roll; min A for powerup to EOB  Transfers Overall transfer level: Needs assistance Equipment used: Rolling walker (2 wheeled) Transfers: Sit to/from Stand Sit to Stand: Min assist         General transfer comment: min A to control descent after in-room ambulation with pt c/o dizziness    Balance Overall balance assessment: Needs assistance         Standing balance support:  Bilateral upper extremity supported;During functional activity Standing balance-Leahy Scale: Poor Standing balance comment: support for balance                            ADL Overall ADL's : Needs assistance/impaired Eating/Feeding: NPO   Grooming: Min guard;Sitting;Standing Grooming Details (indicate cue type and reason): fatigues easily in standing, dizziness Upper Body Bathing: Minimal assitance;Sitting   Lower Body Bathing: Sit to/from stand;Minimal assistance   Upper Body Dressing : Set up;Sitting   Lower Body Dressing: Minimal assistance;Sit to/from stand   Toilet Transfer: Minimal assistance;Ambulation;RW;BSC   Toileting- Clothing Manipulation and Hygiene: Minimal assistance;Sit to/from stand   Tub/ Shower Transfer: Tub transfer;Minimal assistance;Ambulation;3 in 1;Rolling walker   Functional mobility during ADLs: Minimal assistance;Rolling walker General ADL Comments: Pt presents with elevated HR during functional mobility, decreased activity tolerance, decreased balance, and impaired communication impacting level of assist with ADLs. Educated pt on use of suctioning device. Functional mobility completed in-room with rw with pt c/o dizziness and requesting to lie down. Bpm 146, O2 95. Discussed living situation/assist level at home with daughter (see home living comments).      Vision     Perception     Praxis      Pertinent Vitals/Pain Pain Assessment: 0-10 Pain Score: 9  Pain Location: Lt shoulder Pain Descriptors / Indicators: Grimacing Pain Intervention(s): Limited activity within patient's tolerance;Monitored during session;Repositioned;Patient requesting pain meds-RN notified     Hand Dominance     Extremity/Trunk Assessment Upper Extremity Assessment Upper Extremity Assessment: Overall WFL for tasks assessed;Generalized weakness;LUE deficits/detail LUE Deficits /  Details: c/o left shoulder pain at rest           Communication  Communication Communication: No difficulties   Cognition Arousal/Alertness: Awake/alert Behavior During Therapy: WFL for tasks assessed/performed Overall Cognitive Status: Difficult to assess                     General Comments       Exercises       Shoulder Instructions      Home Living Family/patient expects to be discharged to:: Unsure Living Arrangements: Children   Type of Home: Apartment                           Additional Comments: Pt lives with daughter. Bedroom is on second level with access via full flight of stairs. Daughter expressed concern for level of care pt is currently at and ability to provide that care at home. Daughter has young children and may be returning to work soon. Daughter inquiring about skilled care options for further rehab post d/c. Daughter reports she and pt had been receiving hospice/palliative care but they were both dissatisfied, "We want to extend her life." Daughter reports they ended hospice/palliative services about 3 weeks ago.   Lives With: Daughter;Other (Comment)    Prior Functioning/Environment Level of Independence: Independent with assistive device(s);Needs assistance  Gait / Transfers Assistance Needed: rw for functional mobility ADL's / Homemaking Assistance Needed: daughter supervises shower transfers and assist with IADLs and "keeping her safe."        OT Diagnosis: Generalized weakness;Acute pain   OT Problem List: Decreased activity tolerance;Decreased strength;Impaired balance (sitting and/or standing);Decreased safety awareness;Decreased knowledge of use of DME or AE;Decreased knowledge of precautions;Pain   OT Treatment/Interventions: Self-care/ADL training;Energy conservation;DME and/or AE instruction;Therapeutic activities;Patient/family education;Balance training    OT Goals(Current goals can be found in the care plan section) Acute Rehab OT Goals Patient Stated Goal: not stated OT Goal  Formulation: With patient/family Time For Goal Achievement: 08/12/15 Potential to Achieve Goals: Good ADL Goals Pt Will Perform Grooming: with modified independence;sitting;standing Pt Will Perform Lower Body Bathing: with adaptive equipment;sit to/from stand;with supervision Pt Will Perform Lower Body Dressing: with supervision;with adaptive equipment;sit to/from stand Pt Will Transfer to Toilet: with supervision;ambulating;bedside commode Pt Will Perform Toileting - Clothing Manipulation and hygiene: with supervision;sit to/from stand;sitting/lateral leans Pt Will Perform Tub/Shower Transfer: Tub transfer;with supervision;ambulating;3 in 1;rolling walker Additional ADL Goal #1: Pt will complete bed mobility at mod I level to prepare for OOB ADLs.   OT Frequency: Min 2X/week   Barriers to D/C:            Co-evaluation              End of Session Equipment Utilized During Treatment: Gait belt;Rolling walker Nurse Communication: Patient requests pain meds;Other (comment);Mobility status (bpm 146)  Activity Tolerance: Patient tolerated treatment well;Other (comment) (dizziness) Patient left: in bed;with call bell/phone within reach;with bed alarm set;with SCD's reapplied   Time: 1356-1435 OT Time Calculation (min): 39 min Charges:  OT General Charges $OT Visit: 1 Procedure OT Evaluation $Initial OT Evaluation Tier I: 1 Procedure OT Treatments $Self Care/Home Management : 23-37 mins G-Codes:    Hortencia Pilar 2015/08/21, 3:24 PM

## 2015-07-29 NOTE — Progress Notes (Addendum)
STROKE TEAM PROGRESS NOTE   SUBJECTIVE (INTERVAL HISTORY) Her daughter is at the bedside.  Overall she feels her condition is rapidly improving. However, she has difficulty handling her secretions, gurgled sound throughout the rounding, patient has mild to moderate respiratory distress.    OBJECTIVE Temp:  [98 F (36.7 C)-98.8 F (37.1 C)] 98.2 F (36.8 C) (09/08 1718) Pulse Rate:  [113-133] 130 (09/08 1718) Cardiac Rhythm:  [-] Sinus tachycardia (09/08 0700) Resp:  [11-18] 16 (09/08 1718) BP: (123-161)/(67-98) 155/88 mmHg (09/08 1718) SpO2:  [95 %-100 %] 100 % (09/08 1718) Weight:  [96 lb 12.5 oz (43.9 kg)] 96 lb 12.5 oz (43.9 kg) (09/07 2200)   Recent Labs Lab 08/12/2015 1631 07/29/15 0651 07/29/15 1128  GLUCAP 130* 97 84    Recent Labs Lab 08/10/2015 1642 08/17/2015 1653 07/29/15 0640 07/29/15 0710  NA 137 137 136  --   K 2.8* 2.8* 3.7  --   CL 96* 94* 99*  --   CO2 29  --  26  --   GLUCOSE 142* 138* 95  --   BUN 23* 25* 21*  --   CREATININE 0.69 0.80 0.63  --   CALCIUM 9.5  --  9.1  --   MG  --   --   --  2.1    Recent Labs Lab 08/18/2015 1642  AST 32  ALT 13*  ALKPHOS 321*  BILITOT 0.9  PROT 5.4*  ALBUMIN 2.8*    Recent Labs Lab 08/19/2015 1642 07/29/2015 1653 07/29/15 0640  WBC 6.1  --  4.8  NEUTROABS 4.7  --   --   HGB 9.9* 9.9* 9.0*  HCT 30.8* 29.0* 28.1*  MCV 89.0  --  89.5  PLT 30*  --  24*   No results for input(s): CKTOTAL, CKMB, CKMBINDEX, TROPONINI in the last 168 hours.  Recent Labs  08/20/2015 1642  LABPROT 17.1*  INR 1.39    Recent Labs  08/15/2015 1852  COLORURINE AMBER*  LABSPEC 1.016  PHURINE 8.5*  GLUCOSEU NEGATIVE  HGBUR NEGATIVE  BILIRUBINUR NEGATIVE  KETONESUR 15*  PROTEINUR 30*  UROBILINOGEN 1.0  NITRITE POSITIVE*  LEUKOCYTESUR LARGE*       Component Value Date/Time   CHOL 119 07/29/2015 0640   TRIG 130 07/29/2015 0640   HDL 43 07/29/2015 0640   CHOLHDL 2.8 07/29/2015 0640   VLDL 26 07/29/2015 0640   LDLCALC 50  07/29/2015 0640   Lab Results  Component Value Date   HGBA1C 6.1* 03/08/2015      Component Value Date/Time   LABOPIA POSITIVE* 08/01/2015 1852   COCAINSCRNUR NONE DETECTED 07/27/2015 1852   LABBENZ POSITIVE* 07/24/2015 1852   AMPHETMU NONE DETECTED 07/26/2015 1852   THCU POSITIVE* 08/07/2015 1852   LABBARB NONE DETECTED 07/24/2015 1852     Recent Labs Lab 08/03/2015 [place] <5    I have personally reviewed the radiological images below and agree with the radiology interpretations.  Ct Head Wo Contrast  08/01/2015   IMPRESSION: Chronic stable findings as above. No new acute finding.   Mri and Mra Head Wo Contrast  08/10/2015   IMPRESSION: 1. Acute nonhemorrhagic infarct involving the left insular cortex and frontal operculum. 2. The distal left M1 segment is occluded with a single early anterior inferior MCA branch vessel preserved. 3. 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 4. The right vertebral artery is small sudden only a small branch to the  basilar artery. 5. Moderate stenosis of the proximal right P1 segment. 6. Diffuse small vessel disease. 7. Encephalomalacia in the anterior left frontal lobe associated with a left frontal craniotomy. 8. 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    Dg Abd Acute W/chest  07/04/2015  IMPRESSION: 1. New diffuse interstitial accentuation with Kerley B-lines at the right base. The findings are consistent with interstitial edema which may be noncardiac in origin. 2. Extensive stool in the colon without obstructive signs.     Carotid Doppler   - The vertebral arteries appear patent with antegrade flow. - Findings consistent with 1- 39 percent stenosis involving the  right internal carotid artery and the left internal carotid  artery. - ICA/CCA ratio. right = 0.86. left = 1.08.  Venous doppler - No DVT bilaterally  2D Echocardiogram   - Left ventricle: The  cavity size was normal. Wall thickness was increased in a pattern of moderate LVH. Systolic function was normal. The estimated ejection fraction was in the range of 55% to 60%. Wall motion was normal; there were no regional wall motion abnormalities. Due to tachycardia, there was fusion of early and atrial contributions to ventricular filling. - Tricuspid valve: There was moderate regurgitation. - Pericardium, extracardiac: A small, free-flowing pericardial effusion was identified circumferential to the heart. The fluid had no internal echoes.There was no evidence of hemodynamic compromise.  PHYSICAL EXAM  Temp:  [98 F (36.7 C)-98.8 F (37.1 C)] 98.2 F (36.8 C) (09/08 1718) Pulse Rate:  [113-133] 130 (09/08 1718) Resp:  [11-18] 16 (09/08 1718) BP: (123-161)/(67-98) 155/88 mmHg (09/08 1718) SpO2:  [95 %-100 %] 100 % (09/08 1718) Weight:  [96 lb 12.5 oz (43.9 kg)] 96 lb 12.5 oz (43.9 kg) (09/07 2200)  General - cachectic, well developed, in acute respiratory distress.  Ophthalmologic - fundi not visualized due to respiratory distress   Cardiovascular - Regular rate and rhythm.  Neuro - awake, alert, follows simple commands. Language testing limited due to acute respiratory distress, however, able to name 2/3 and able to repeat, however severe dysarthria due to respiratory distress. Visual field intact, PERRL, EOMI, mild right nasolabial fold flattening, right upper extremity 5-/5, and right lower extremity 5-/5, left upper and left lower extremity 5/5. Sensation symmetrical, coordination intact bilateral hands, DTR 1+, no Babinski.   ASSESSMENT/PLAN [name] [name] is a 64 y.o. female with history of advanced lung cancer with diffuse metastasis, seizure on Lamictal, left frontal brain tumor status post removal, hypertension, diabetes  admitted for altered mental status, aphasia, right facial droop and right-sided weakness. Symptoms rapidly improving.    Stroke:  Left MCA territory  infarct embolic secondary to hypercoagulable state due to advanced malignancy   MRI  left MCA territory infarct   MRA left M1 cutoff, left VA occlusion   Carotid Doppler  unremarkable   2D Echo  unremarkable   LDL 50   HgbA1c pending  SCDs  for VTE prophylaxis  Diet NPO time specified   no antithrombotic prior to admission, now on no antithrombotic. Due to hypercoagulable state, for stroke prevention patient needed anticoagulation with either Lovenox therapeutic dose or Coumadin with INR 2-3. However, given patient's advanced malignancy with poor prognosis and severe pancytopenia, agree to hold off antiplatelet and anticoagulation and please consider palliative care consult.  Diabetes  HgbA1c pending  goal < 7.0  Controlled  CBG monitoring  Hypertension  Home meds:   None Permissive hypertension (OK if <220/120) for 24-48 hours post stroke and then gradually normalized  within 5-7 days. Currently on none  Stable  Patient counseled to be compliant with her blood pressure medications  Seizure  Likely associated with left frontal brain tumor removal  Reported to have a Lamictal at home, however not on the list  On Keppra now, continue Keppra  Seizure precautions  Other Stroke Risk Factors  Advanced age  Former smoker  Other Active Problems  Pancytopenia   Severe malnutrition  Other Pertinent History  Family to discuss palliative care    Hospital day # 1  I spent more than 40 minutes of face to face time with the patient. Greater than 50% of time was spent in counseling and coordination of care.   Neurology will sign off. Please call with questions. No neurology follow-up indicated. Thanks for the consult.  Rosalin Hawking, MD PhD Stroke Neurology 07/29/2015 6:23 PM    To contact Stroke Continuity provider, please refer to [email or web address] After hours, contact General Neurology

## 2015-07-29 NOTE — Progress Notes (Signed)
  Echocardiogram 2D Echocardiogram has been performed.  Darlina Sicilian M 07/29/2015, 12:20 PM

## 2015-07-29 NOTE — Progress Notes (Signed)
*  PRELIMINARY RESULTS* Vascular Ultrasound Carotid Duplex (Doppler) has been completed. Bilateral:  1-39% ICA stenosis.  Vertebral artery flow is antegrade.    Lower extremity venous duplex= negative for DVT.   Landry Mellow, RDMS, RVT  07/29/2015, 11:18 AM

## 2015-07-29 NOTE — Evaluation (Signed)
Speech Language Pathology Evaluation Patient Details Name: [name] [name] MRN: [id number] DOB: 11/05/1951 Today's Date: 07/29/2015 Time: 1210-1223 SLP Time Calculation (min) (ACUTE ONLY): 13 min  Problem List:  Patient Active Problem List   Diagnosis Date Noted  . Lung cancer, primary, with metastasis from lung to other site   . CVA (cerebral infarction) 08/15/2015  . Seizure   . Uremia 07/04/2015  . Acute renal failure 07/04/2015  . Acute encephalopathy 07/04/2015  . Altered mental status   . Metastatic cancer   . Absolute anemia   . Symptomatic anemia 06/02/2015  . Encounter for palliative care   . Neuropathic pain   . Constipation due to opioid therapy   . Anxiety and depression   . Lactic acidosis   . SIRS (systemic inflammatory response syndrome) 04/30/2015  . Malnutrition of moderate degree 04/30/2015  . Dehydration   . Hypokalemia   . GERD (gastroesophageal reflux disease)   . Chronic pain   . AP (abdominal pain)   . Protein-calorie malnutrition, severe 04/04/2015  . Nausea and vomiting 04/02/2015  . Nausea & vomiting 04/02/2015  . Left lateral abdominal pain 04/02/2015  . DM gastroparesis 04/02/2015  . Generalized anxiety disorder 04/02/2015  . Anxiety state   . Palliative care encounter   . Depression   . CN (constipation)   . Metastatic cancer to bone   . Metastatic adenocarcinoma 03/08/2015  . Nausea with vomiting 03/08/2015  . Cancer associated pain 03/08/2015  . Osseous metastasis 02/17/2015  . Uncontrolled pain 02/17/2015  . Radiculopathy 03/05/2014  . Intermittent diarrhea 02/07/2014  . Spinal stenosis of lumbar region L L%-S1 08/10/2013  . Other and unspecified noninfectious gastroenteritis and colitis(558.9) 05/27/2013  . Abdominal pain 05/26/2013  . Hepatitis C 05/26/2013  . Hepatitis B 05/26/2013  . Hypertension 05/26/2013  . Diabetes mellitus 05/26/2013  . Nausea vomiting and diarrhea 03/08/2013  . Abdominal pain, epigastric 03/08/2013  .  Enteritis due to Clostridium difficile 01/02/2013  . Tachycardia 12/30/2012  . Bipolar 1 disorder 12/29/2012  . S/P thoracotomy 12/10/2012  . Bipolar disorder 12/08/2012  . Anxiety disorder 12/08/2012  . Lung cancer, right upper lobe 11/07/2012   Past Medical History:  Past Medical History  Diagnosis Date  . Hypertension   . Bipolar 1 disorder   . Heart murmur   . Sciatica   . Arthritis   . Anxiety   . Diverticulosis   . Chronic low back pain   . DDD (degenerative disc disease), lumbar   . Chronic shoulder pain   . Chronic neck pain   . Hepatitis B     Unclear when initially diagnosed, labs in Epic from 03/09/13  . Hepatitis C     Unclear when initially diagnosed, labs in Epic from 03/09/13  . C. difficile diarrhea     April and February 2014  . COPD (chronic obstructive pulmonary disease)   . GERD (gastroesophageal reflux disease)   . H/O hiatal hernia   . Headache(784.0)     hx  . PONV (postoperative nausea and vomiting)   . Seizures     on meds  . Diabetes mellitus   . Adenocarcinoma of lung, stage 1   . GERD (gastroesophageal reflux disease)    Past Surgical History:  Past Surgical History  Procedure Laterality Date  . Facial tumor removal Right 2000  . Brain tumor removal  09  . Brain surgery      in [place]  . Video bronchoscopy  12/04/2012    Procedure:  VIDEO BRONCHOSCOPY;  Surgeon: Grace Isaac, MD;  Location: [facility] OR;  Service: Thoracic;  Laterality: N/A;  . Video assisted thoracoscopy (vats)/wedge resection  12/04/2012    Procedure: VIDEO ASSISTED THORACOSCOPY (VATS)/WEDGE RESECTION;  Surgeon: Grace Isaac, MD;  Location: [place];  Service: Thoracic;  Laterality: Right;  . Lobectomy  12/04/2012    Procedure: LOBECTOMY;  Surgeon: Grace Isaac, MD;  Location: [place];  Service: Thoracic;  Laterality: Right;  completion of right upper lobectomy and lymph node disection, placement of on q pump  . Tubal ligation    . Tonsillectomy    . Anterior  cervical decomp/discectomy fusion N/A 03/05/2014    Procedure: ANTERIOR CERVICAL DECOMPRESSION/DISCECTOMY FUSION 2 LEVELS;  Surgeon: Sinclair Ship, MD;  Location: [place];  Service: Orthopedics;  Laterality: N/A;  Anterior cervical decompression fusion, cervical 4-5, cervical 5-6 with instrumentation, allograft.   HPI:  64 y.o. female with known lung cancer, pancytopenia, seizure disorder admitted with right facial asymmetry and nonverbal.   H/o left frontal brain tumor s/p resection in 2009.  ACDF 2015. CT negative for acute event.  Per MD notes, Patient's diffusely metastatic lung cancer with skull mets concerning for brain mets, though MRI in April 2016 was negative.   Assessment / Plan / Recommendation Clinical Impression  Pt verbal this afternoon - demonstrates phonatory tremor, perseveratory and palilalic verbalizations (e.g, when reciting numbers 1-10 or naming days of week, repeats stimulus word multiple times with decreased ability to inhibit response).  Able to name common objects to confrontation, though perseverations interfere. Follows one-step but not two-step commands.  Emerging ability to make basic needs known in short phrases.  Smiling and Social research officer, government.  Recommend SLP f/u to address communication; await results further testing.        SLP Assessment  Patient needs continued Speech Lanaguage Pathology Services    Follow Up Recommendations   (tba)    Frequency and Duration min 2x/week  2 weeks   Pertinent Vitals/Pain Pain Assessment: No/denies pain   SLP Goals  Potential to Achieve Goals (ACUTE ONLY): Fair  SLP Evaluation Prior Functioning  Cognitive/Linguistic Baseline: Information not available Type of Home: Apartment  Lives With: Son   Cognition  Overall Cognitive Status: Impaired/Different from baseline Arousal/Alertness: Awake/alert Orientation Level: Oriented to person;Disoriented to place;Disoriented to time;Disoriented to situation Attention:  Focused Focused Attention: Appears intact    Comprehension  Auditory Comprehension Overall Auditory Comprehension: Impaired Yes/No Questions: Impaired Basic Immediate Environment Questions: 50-74% accurate Commands: Impaired One Step Basic Commands: 75-100% accurate Two Step Basic Commands: 25-49% accurate Conversation: Simple Visual Recognition/Discrimination Discrimination: Not tested Reading Comprehension Reading Status: Not tested    Expression Expression Primary Mode of Expression: Verbal Verbal Expression Overall Verbal Expression: Impaired Initiation: Impaired Automatic Speech:  (perseveration during automatic speech tasks) Level of Generative/Spontaneous Verbalization: Phrase Repetition: No impairment Naming: Impairment Responsive: 0-25% accurate Confrontation: Impaired Convergent: 50-74% accurate Verbal Errors: Perseveration   Oral / Motor Oral Motor/Sensory Function Overall Oral Motor/Sensory Function: Appears within functional limits for tasks assessed Motor Speech Overall Motor Speech: Impaired Respiration: Within functional limits Phonation:  (vocal tremors) Resonance: Within functional limits Articulation: Impaired Level of Impairment: Phrase Intelligibility: Intelligibility reduced Word: 75-100% accurate Motor Planning: Witnin functional limits   GO     [name] 07/29/2015, 12:45 PM

## 2015-07-29 NOTE — Evaluation (Signed)
Clinical/Bedside Swallow Evaluation Patient Details  Name: [name] [name] MRN: [id number] Date of Birth: 09-14-1951  Today's Date: 07/29/2015 Time: SLP Start Time (ACUTE ONLY): 1158 SLP Stop Time (ACUTE ONLY): 1210 SLP Time Calculation (min) (ACUTE ONLY): 12 min  Past Medical History:  Past Medical History  Diagnosis Date  . Hypertension   . Bipolar 1 disorder   . Heart murmur   . Sciatica   . Arthritis   . Anxiety   . Diverticulosis   . Chronic low back pain   . DDD (degenerative disc disease), lumbar   . Chronic shoulder pain   . Chronic neck pain   . Hepatitis B     Unclear when initially diagnosed, labs in Epic from 03/09/13  . Hepatitis C     Unclear when initially diagnosed, labs in Epic from 03/09/13  . C. difficile diarrhea     April and February 2014  . COPD (chronic obstructive pulmonary disease)   . GERD (gastroesophageal reflux disease)   . H/O hiatal hernia   . Headache(784.0)     hx  . PONV (postoperative nausea and vomiting)   . Seizures     on meds  . Diabetes mellitus   . Adenocarcinoma of lung, stage 1   . GERD (gastroesophageal reflux disease)    Past Surgical History:  Past Surgical History  Procedure Laterality Date  . Facial tumor removal Right 2000  . Brain tumor removal  09  . Brain surgery      in [place]  . Video bronchoscopy  12/04/2012    Procedure: VIDEO BRONCHOSCOPY;  Surgeon: Grace Isaac, MD;  Location: [facility] OR;  Service: Thoracic;  Laterality: N/A;  . Video assisted thoracoscopy (vats)/wedge resection  12/04/2012    Procedure: VIDEO ASSISTED THORACOSCOPY (VATS)/WEDGE RESECTION;  Surgeon: Grace Isaac, MD;  Location: [place];  Service: Thoracic;  Laterality: Right;  . Lobectomy  12/04/2012    Procedure: LOBECTOMY;  Surgeon: Grace Isaac, MD;  Location: [place];  Service: Thoracic;  Laterality: Right;  completion of right upper lobectomy and lymph node disection, placement of on q pump  . Tubal ligation    . Tonsillectomy     . Anterior cervical decomp/discectomy fusion N/A 03/05/2014    Procedure: ANTERIOR CERVICAL DECOMPRESSION/DISCECTOMY FUSION 2 LEVELS;  Surgeon: Sinclair Ship, MD;  Location: [place];  Service: Orthopedics;  Laterality: N/A;  Anterior cervical decompression fusion, cervical 4-5, cervical 5-6 with instrumentation, allograft.   HPI:  64 y.o. female with known Lung Cancer, pancytopenia, seizure disorder admitted with right faciial asymmetry and nonverbal.   H/o left frontal brain tumor s/p resection in 2009, ACDF 2015.  CT negative for acute event.  Per MD notes, patient's diffusely metastatic lung cancer with skull mets concerning for brain mets, though MRI in April 2016 was negative.   Assessment / Plan / Recommendation Clinical Impression  Pt with much improved alertness, participation since this morning.  Presents with acute dysphagia: difficulty managing secretions with copious material suctioned orally from pharynx.  Pt attentive to presentation of POs, but demonstrated oral holding, requiring max verbal/tactile cues to initiate a swallow.  Consumption of thin liquids/ice chips led to immediate cough, wet phonation, suggesting aspiration.  Purees were held orally, requiring suctioning to remove as pt could not carry out swallow sequence.  For today, recommend NPO; SLP will follow next date to determine readiness to begin a diet.  D/W RN.      Aspiration Risk  Severe  Diet Recommendation NPO        Other  Recommendations Oral Care Recommendations: Oral care QID   Follow Up Recommendations       Frequency and Duration min 2x/week  2 weeks       SLP Swallow Goals     Swallow Study Prior Functional Status       General Date of Onset: 07/24/2015 Other Pertinent Information: 64 y.o. female with known Lung Cancer, pancytopenia, seizure disorder admitted with right faciial asymmetry and nonverbal.   H/o left frontal brain tumor s/p resection in 2009.  CT negative for acute event.   Per MD notes, Patient's diffusely metastatic lung cancer with skull mets concerning for brain mets, though MRI in April 2016 was negative. Type of Study: Bedside swallow evaluation Previous Swallow Assessment: 4/16 with esophageal deficits noted Diet Prior to this Study: NPO Temperature Spikes Noted: No Respiratory Status: Room air History of Recent Intubation: No Behavior/Cognition: Alert Oral Cavity - Dentition: Edentulous Self-Feeding Abilities: Needs assist Patient Positioning: Upright in bed Baseline Vocal Quality: Wet (tremulous voice) Volitional Cough: Cognitively unable to elicit Volitional Swallow: Unable to elicit    Oral/Motor/Sensory Function Overall Oral Motor/Sensory Function: Appears within functional limits for tasks assessed (symmetric)   Ice Chips Ice chips: Impaired Presentation: Spoon Oral Phase Impairments: Impaired mastication Oral Phase Functional Implications: Oral holding Pharyngeal Phase Impairments: Suspected delayed Swallow;Wet Vocal Quality   Thin Liquid Thin Liquid: Impaired Presentation: Spoon Oral Phase Functional Implications: Oral holding Pharyngeal  Phase Impairments: Suspected delayed Swallow;Cough - Immediate    Nectar Thick Nectar Thick Liquid: Not tested   Honey Thick Honey Thick Liquid: Not tested   Puree Puree: Impaired Presentation: Spoon Oral Phase Functional Implications: Oral holding Pharyngeal Phase Impairments: Unable to trigger swallow (material suctioned from oral cavity)   Solid  [name] Bolander L. [place], [name] CCC/SLP Pager [phone]     Solid: Not tested       [name] [name] 07/29/2015,12:32 PM

## 2015-07-29 NOTE — Progress Notes (Signed)
[name] [name]   DOB:May 03, 1951   HY#:[id number]   RSW#:[id number]  Subjective: Pt is well-known to me, was admitted for acute ischemic stroke. I saw her on floor, she was able to recognize me, and oriented to place. She has large amount secretion in throat, and gurgling through her talking. Some respiratory distress.    Objective:  Filed Vitals:   07/29/15 1718  BP: 155/88  Pulse: 130  Temp: 98.2 F (36.8 C)  Resp: 16    Body mass index is 16.6 kg/(m^2). No intake or output data in the 24 hours ending 07/29/15 1915   Sclerae unicteric  Oropharynx clear  No peripheral adenopathy  Lungs clear -- no rales or rhonchi  Heart regular rate and rhythm  Abdomen benign  MSK no focal spinal tenderness, no peripheral edema  Neuro nonfocal   CBG (last 3)   Recent Labs  08/17/2015 1631 07/29/15 0651 07/29/15 1128  GLUCAP 130* 97 84     Labs:  Lab Results  Component Value Date   WBC 4.8 07/29/2015   HGB 9.0* 07/29/2015   HCT 28.1* 07/29/2015   MCV 89.5 07/29/2015   PLT 24* 07/29/2015   NEUTROABS 4.7 08/10/2015    '@LASTCHEMISTRY'$ @  Urine Studies No results for input(s): UHGB, CRYS in the last 72 hours.  Invalid input(s): UACOL, UAPR, USPG, UPH, UTP, UGL, UKET, UBIL, UNIT, UROB, ULEU, UEPI, UWBC, URBC, [place], [place]  Basic Metabolic Panel:  Recent Labs Lab 08/03/2015 1642 08/20/2015 1653 07/29/15 0640 07/29/15 0710  NA 137 137 136  --   K 2.8* 2.8* 3.7  --   CL 96* 94* 99*  --   CO2 29  --  26  --   GLUCOSE 142* 138* 95  --   BUN 23* 25* 21*  --   CREATININE 0.69 0.80 0.63  --   CALCIUM 9.5  --  9.1  --   MG  --   --   --  2.1   GFR Estimated Creatinine Clearance: 47.9 mL/min (by C-G formula based on Cr of 0.63). Liver Function Tests:  Recent Labs Lab 07/27/2015 1642  AST 32  ALT 13*  ALKPHOS 321*  BILITOT 0.9  PROT 5.4*  ALBUMIN 2.8*   No results for input(s): LIPASE, AMYLASE in the last 168 hours. No results for input(s): AMMONIA in the last  168 hours. Coagulation profile  Recent Labs Lab 07/31/2015 1642  INR 1.39    CBC:  Recent Labs Lab 07/27/2015 1642 08/18/2015 1653 07/29/15 0640  WBC 6.1  --  4.8  NEUTROABS 4.7  --   --   HGB 9.9* 9.9* 9.0*  HCT 30.8* 29.0* 28.1*  MCV 89.0  --  89.5  PLT 30*  --  24*   Cardiac Enzymes: No results for input(s): CKTOTAL, CKMB, CKMBINDEX, TROPONINI in the last 168 hours. BNP: Invalid input(s): POCBNP CBG:  Recent Labs Lab 07/30/2015 1631 07/29/15 0651 07/29/15 1128  GLUCAP 130* 97 84   D-Dimer No results for input(s): DDIMER in the last 72 hours. Hgb A1c No results for input(s): HGBA1C in the last 72 hours. Lipid Profile  Recent Labs  07/29/15 0640  CHOL 119  HDL 43  LDLCALC 50  TRIG 130  CHOLHDL 2.8   Thyroid function studies No results for input(s): TSH, T4TOTAL, T3FREE, THYROIDAB in the last 72 hours.  Invalid input(s): FREET3 Anemia work up No results for input(s): VITAMINB12, FOLATE, FERRITIN, TIBC, IRON, RETICCTPCT in the last 72 hours. Microbiology No results found  for this or any previous visit (from the past 240 hour(s)).    Studies:  Ct Head Wo Contrast  08/11/2015   CLINICAL DATA:  Code stroke, seizure. Slurred speech. Brain tumor resection 2009  EXAM: CT HEAD WITHOUT CONTRAST  TECHNIQUE: Contiguous axial images were obtained from the base of the skull through the vertex without intravenous contrast.  COMPARISON:  06/15/2015  FINDINGS: Left frontal encephalomalacia reidentified. Mild cortical volume loss noted with proportional ventricular prominence. Areas of periventricular white matter hypodensity are most compatible with small vessel ischemic change. No acute hemorrhage, infarct, or mass lesion is identified. No midline shift. Moderate sphenoid sinusitis. Left frontal craniotomy defect. Areas of relative subjective osteopenia are reidentified in the skull.  IMPRESSION: Chronic stable findings as above. No new acute finding. Critical Value/emergent  results were called by telephone at the time of interpretation on 08/03/2015 at 4:31 pm to Dr. Janann Colonel, who verbally acknowledged these results.   Electronically Signed   By: Conchita Paris M.D.   On: 07/23/2015 16:33   Mr [name] Head Wo Contrast  08/14/2015   CLINICAL DATA:  Acute onset of right-sided facial droop and aphasia. Known seizure disorder. Personal history of brain tumor.  EXAM: MRI HEAD WITHOUT CONTRAST  MRA HEAD WITHOUT CONTRAST  TECHNIQUE: Multiplanar, multiecho pulse sequences of the brain and surrounding structures were obtained without intravenous contrast. Angiographic images of the head were obtained using MRA technique without contrast.  COMPARISON:  CT head without contrast 07/29/2015. MRI brain 02/26/2015.  FINDINGS: MRI HEAD FINDINGS  The diffusion-weighted images demonstrate acute nonhemorrhagic infarct involving the left insular cortex and frontal operculum. No acute hemorrhage or mass lesion is present. Minimal T2 changes are associated with the acute infarct. Or remote encephalomalacia in the anterior left frontal lobe is again noted.  Mild periventricular T2 changes are otherwise stable.  Flow present in the major intracranial arteries. Bilateral lens replacements are noted. Extensive mucosal thickening is present in the left sphenoid sinus and to lesser extent the right sphenoid sinus. The remaining paranasal sinuses and the mastoid air cells are clear.  Remote blood products are associated with the left frontal craniotomy. Extensive osseous metastases are noted in the calvarium.  MRA HEAD FINDINGS  The internal carotid arteries demonstrate minimal atherosclerotic changes the cavernous segments bilaterally, left greater than right. Mild atherosclerotic irregularity is present within the A1 segments bilaterally. The distal left M1 occlusion is present. And early inferior left MCA branch is maintained. There is mild distal attenuation of MCA and ACA branch vessels.  The right vertebral  artery bifurcates at the PICA ascending a small branch to the basilar artery. The left vertebral artery demonstrates a high-grade stenosis just beyond the dura in terminates and a small left PICA. 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 This may be the termination of the native occluded left vertebral artery. A distal left vertebral stump is noted. The basilar artery is small. Both posterior cerebral arteries originate from the basilar tip. There is a moderate proximal stenosis on the right. A left posterior communicating artery contributes. Moderate attenuation of PCA branch vessels is worse on the right.  IMPRESSION: 1. Acute nonhemorrhagic infarct involving the left insular cortex and frontal operculum. 2. The distal left M1 segment is occluded with a single early anterior inferior MCA branch vessel preserved. 3. 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 4. The right vertebral  artery is small sudden only a small branch to the basilar artery. 5. Moderate stenosis of the proximal right P1 segment. 6. Diffuse small vessel disease. 7. Encephalomalacia in the anterior left frontal lobe associated with a left frontal craniotomy. 8. 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 These results were called by telephone at the time of interpretation on 07/30/2015 at 9:40 pm to Dr. Davonna Belling , who verbally acknowledged these results.   Electronically Signed   By: San Morelle M.D.   On: 08/16/2015 21:40   Mr Brain Wo Contrast  08/12/2015   CLINICAL DATA:  Acute onset of right-sided facial droop and aphasia. Known seizure disorder. Personal history of brain tumor.  EXAM: MRI HEAD WITHOUT CONTRAST  MRA HEAD WITHOUT CONTRAST  TECHNIQUE: Multiplanar, multiecho pulse sequences of the brain and surrounding structures were obtained without intravenous contrast. Angiographic images of the head were  obtained using MRA technique without contrast.  COMPARISON:  CT head without contrast 07/26/2015. MRI brain 02/26/2015.  FINDINGS: MRI HEAD FINDINGS  The diffusion-weighted images demonstrate acute nonhemorrhagic infarct involving the left insular cortex and frontal operculum. No acute hemorrhage or mass lesion is present. Minimal T2 changes are associated with the acute infarct. Or remote encephalomalacia in the anterior left frontal lobe is again noted.  Mild periventricular T2 changes are otherwise stable.  Flow present in the major intracranial arteries. Bilateral lens replacements are noted. Extensive mucosal thickening is present in the left sphenoid sinus and to lesser extent the right sphenoid sinus. The remaining paranasal sinuses and the mastoid air cells are clear.  Remote blood products are associated with the left frontal craniotomy. Extensive osseous metastases are noted in the calvarium.  MRA HEAD FINDINGS  The internal carotid arteries demonstrate minimal atherosclerotic changes the cavernous segments bilaterally, left greater than right. Mild atherosclerotic irregularity is present within the A1 segments bilaterally. The distal left M1 occlusion is present. And early inferior left MCA branch is maintained. There is mild distal attenuation of MCA and ACA branch vessels.  The right vertebral artery bifurcates at the PICA ascending a small branch to the basilar artery. The left vertebral artery demonstrates a high-grade stenosis just beyond the dura in terminates and a small left PICA. 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 This may be the termination of the native occluded left vertebral artery. A distal left vertebral stump is noted. The basilar artery is small. Both posterior cerebral arteries originate from the basilar tip. There is a moderate proximal stenosis on the right. A left posterior communicating artery contributes. Moderate attenuation of  PCA branch vessels is worse on the right.  IMPRESSION: 1. Acute nonhemorrhagic infarct involving the left insular cortex and frontal operculum. 2. The distal left M1 segment is occluded with a single early anterior inferior MCA branch vessel preserved. 3. 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 4. The right vertebral artery is small sudden only a small branch to the basilar artery. 5. Moderate stenosis of the proximal right P1 segment. 6. Diffuse small vessel disease. 7. Encephalomalacia in the anterior left frontal lobe associated with a left frontal craniotomy. 8. 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 These results were called by telephone at the time of interpretation on 08/13/2015 at 9:40 pm to Dr. Davonna Belling , who verbally acknowledged these results.   Electronically Signed   By: San Morelle M.D.   On:  07/30/2015 21:40    Assessment: 64 y.o.   1. Acute left MCA infarct 2.  Metastatic lung cancer, terminal stage, not a candidate for treatment due to her PS and thrombocytopenia  3. Anemia and thrombocytopenia, secondary to #2 and diffuse bone mets 4. DM 5. Hep c   Plan:  -neurology note reviewed. Given her severe thrombocytopenia, I will not recommend anticoagulation for her acute ischemic stroke, due to the high risk of bleeding -The approach to her overall management should be palliative, and comfort care -I spoke with her daughter regarding the goal of care, she agrees with palliative goal, but does not want her to go to inpt hospice.  -Her daughter also wants her to go to a rehab for recovery. Please evaluate by PT -Pt would reconsider home hospice when she goes home from rehab  I will follow up if needed, please call me if anything I can help with.    Truitt Merle, MD 07/29/2015  7:15 PM

## 2015-07-29 NOTE — Progress Notes (Signed)
Subjective: Patient seen and examined at bedside, there is no family members present to help with historical information, but patient is reported to be alert and oriented, conversational at her baseline.  When examined, patient has expressive aphasia, ability to follow commands appropriately, responsive to painful stimuli, otherwise babbling and not forming any coherent sentences.   Objective: Vital signs in last 24 hours: Filed Vitals:   07/29/15 0600 07/29/15 0800 07/29/15 1000 07/29/15 1303  BP: 155/87 144/78 154/96 133/90  Pulse: 131 127 131 133  Temp: 98.2 F (36.8 C) 98.1 F (36.7 C) 98.2 F (36.8 C) 98 F (36.7 C)  TempSrc: Oral Oral Oral Oral  Resp: '18 14 15 18  '$ Weight:      SpO2: 100% 100% 100% 100%   Weight change:   Intake/Output Summary (Last 24 hours) at 07/29/15 1656 Last data filed at 08/14/2015 1819  Gross per 24 hour  Intake   1000 ml  Output      0 ml  Net   1000 ml   General: lying in bed, awake, not able to respond to questions, but able to follow commands HEENT: EOMI, no scleral icterus Cardiac: RRR, no rubs, murmurs or gallops Pulm: clear to auscultation bilaterally Abd: soft, appears to be tender to palpation based on response, nondistended, BS present Neuro: alert, cranial nerves II-XII grossly intact, strength is equal in upper extremities 4/5, unable to perform finger to nose testing, decreased strength in right lower extremity with inability to hold leg up against gravity.  Lab Results: Basic Metabolic Panel:  Recent Labs Lab 07/27/2015 1642 08/02/2015 1653 07/29/15 0640 07/29/15 0710  NA 137 137 136  --   K 2.8* 2.8* 3.7  --   CL 96* 94* 99*  --   CO2 29  --  26  --   GLUCOSE 142* 138* 95  --   BUN 23* 25* 21*  --   CREATININE 0.69 0.80 0.63  --   CALCIUM 9.5  --  9.1  --   MG  --   --   --  2.1   Liver Function Tests:  Recent Labs Lab 08/15/2015 1642  AST 32  ALT 13*  ALKPHOS 321*  BILITOT 0.9  PROT 5.4*  ALBUMIN 2.8*    CBC:  Recent Labs Lab 07/27/2015 1642 07/24/2015 1653 07/29/15 0640  WBC 6.1  --  4.8  NEUTROABS 4.7  --   --   HGB 9.9* 9.9* 9.0*  HCT 30.8* 29.0* 28.1*  MCV 89.0  --  89.5  PLT 30*  --  24*   CBG:  Recent Labs Lab 08/01/2015 1631 07/29/15 0651 07/29/15 1128  GLUCAP 130* 97 84   Fasting Lipid Panel:  Recent Labs Lab 07/29/15 0640  CHOL 119  HDL 43  LDLCALC 50  TRIG 130  CHOLHDL 2.8   Coagulation:  Recent Labs Lab 08/16/2015 1642  LABPROT 17.1*  INR 1.39   Anemia Panel: No results for input(s): VITAMINB12, FOLATE, FERRITIN, TIBC, IRON, RETICCTPCT in the last 168 hours. Urine Drug Screen: Drugs of Abuse     Component Value Date/Time   LABOPIA POSITIVE* 08/16/2015 1852   COCAINSCRNUR NONE DETECTED 08/08/2015 1852   LABBENZ POSITIVE* 08/14/2015 1852   AMPHETMU NONE DETECTED 08/18/2015 1852   THCU POSITIVE* 08/16/2015 1852   LABBARB NONE DETECTED 08/11/2015 1852    Alcohol Level:  Recent Labs Lab 08/19/2015 1642  ETH <5   Urinalysis:  Recent Labs Lab 08/10/2015 [place]  1.016  PHURINE 8.5*  GLUCOSEU NEGATIVE  HGBUR NEGATIVE  BILIRUBINUR NEGATIVE  KETONESUR 15*  PROTEINUR 30*  UROBILINOGEN 1.0  NITRITE POSITIVE*  LEUKOCYTESUR LARGE*    Micro Results: No results found for this or any previous visit (from the past 240 hour(s)). Studies/Results: Ct Head Wo Contrast  08/14/2015   CLINICAL DATA:  Code stroke, seizure. Slurred speech. Brain tumor resection 2009  EXAM: CT HEAD WITHOUT CONTRAST  TECHNIQUE: Contiguous axial images were obtained from the base of the skull through the vertex without intravenous contrast.  COMPARISON:  06/15/2015  FINDINGS: Left frontal encephalomalacia reidentified. Mild cortical volume loss noted with proportional ventricular prominence. Areas of periventricular white matter hypodensity are most compatible with small vessel ischemic change. No acute hemorrhage, infarct, or mass lesion is identified.  No midline shift. Moderate sphenoid sinusitis. Left frontal craniotomy defect. Areas of relative subjective osteopenia are reidentified in the skull.  IMPRESSION: Chronic stable findings as above. No new acute finding. Critical Value/emergent results were called by telephone at the time of interpretation on 08/06/2015 at 4:31 pm to Dr. Janann [name], who verbally acknowledged these results.   Electronically Signed   By: [name] Paris M.D.   On: 07/27/2015 16:33   Mr [name] [name] Head Wo Contrast  08/18/2015   CLINICAL DATA:  Acute onset of right-sided facial droop and aphasia. Known seizure disorder. Personal history of brain tumor.  EXAM: MRI HEAD WITHOUT CONTRAST  MRA HEAD WITHOUT CONTRAST  TECHNIQUE: Multiplanar, multiecho pulse sequences of the brain and surrounding structures were obtained without intravenous contrast. Angiographic images of the head were obtained using MRA technique without contrast.  COMPARISON:  CT head without contrast 08/19/2015. MRI brain 02/26/2015.  FINDINGS: MRI HEAD FINDINGS  The diffusion-weighted images demonstrate acute nonhemorrhagic infarct involving the left insular cortex and frontal operculum. No acute hemorrhage or mass lesion is present. Minimal T2 changes are associated with the acute infarct. Or remote encephalomalacia in the anterior left frontal lobe is again noted.  Mild periventricular T2 changes are otherwise stable.  Flow present in the major intracranial arteries. Bilateral lens replacements are noted. Extensive mucosal thickening is present in the left sphenoid sinus and to lesser extent the right sphenoid sinus. The remaining paranasal sinuses and the mastoid air cells are clear.  Remote blood products are associated with the left frontal craniotomy. Extensive osseous metastases are noted in the calvarium.  MRA HEAD FINDINGS  The internal carotid arteries demonstrate minimal atherosclerotic changes the cavernous segments bilaterally, left greater than right. Mild  atherosclerotic irregularity is present within the A1 segments bilaterally. The distal left M1 occlusion is present. And early inferior left MCA branch is maintained. There is mild distal attenuation of MCA and ACA branch vessels.  The right vertebral artery bifurcates at the PICA ascending a small branch to the basilar artery. The left vertebral artery demonstrates a high-grade stenosis just beyond the dura in terminates and a small left PICA. 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 This may be the termination of the native occluded left vertebral artery. A distal left vertebral stump is noted. The basilar artery is small. Both posterior cerebral arteries originate from the basilar tip. There is a moderate proximal stenosis on the right. A left posterior communicating artery contributes. Moderate attenuation of PCA branch vessels is worse on the right.  IMPRESSION: 1. Acute nonhemorrhagic infarct involving the left insular cortex and frontal operculum. 2. The distal left M1 segment is occluded with  a single early anterior inferior MCA branch vessel preserved. 3. 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 4. The right vertebral artery is small sudden only a small branch to the basilar artery. 5. Moderate stenosis of the proximal right P1 segment. 6. Diffuse small vessel disease. 7. Encephalomalacia in the anterior left frontal lobe associated with a left frontal craniotomy. 8. 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 These results were called by telephone at the time of interpretation on 07/25/2015 at 9:40 pm to Dr. Davonna Belling , who verbally acknowledged these results.   Electronically Signed   By: San Morelle M.D.   On: 07/26/2015 21:40   Mr Brain Wo Contrast  07/22/2015   CLINICAL DATA:  Acute onset of right-sided facial droop and aphasia. Known seizure disorder. Personal  history of brain tumor.  EXAM: MRI HEAD WITHOUT CONTRAST  MRA HEAD WITHOUT CONTRAST  TECHNIQUE: Multiplanar, multiecho pulse sequences of the brain and surrounding structures were obtained without intravenous contrast. Angiographic images of the head were obtained using MRA technique without contrast.  COMPARISON:  CT head without contrast 08/18/2015. MRI brain 02/26/2015.  FINDINGS: MRI HEAD FINDINGS  The diffusion-weighted images demonstrate acute nonhemorrhagic infarct involving the left insular cortex and frontal operculum. No acute hemorrhage or mass lesion is present. Minimal T2 changes are associated with the acute infarct. Or remote encephalomalacia in the anterior left frontal lobe is again noted.  Mild periventricular T2 changes are otherwise stable.  Flow present in the major intracranial arteries. Bilateral lens replacements are noted. Extensive mucosal thickening is present in the left sphenoid sinus and to lesser extent the right sphenoid sinus. The remaining paranasal sinuses and the mastoid air cells are clear.  Remote blood products are associated with the left frontal craniotomy. Extensive osseous metastases are noted in the calvarium.  MRA HEAD FINDINGS  The internal carotid arteries demonstrate minimal atherosclerotic changes the cavernous segments bilaterally, left greater than right. Mild atherosclerotic irregularity is present within the A1 segments bilaterally. The distal left M1 occlusion is present. And early inferior left MCA branch is maintained. There is mild distal attenuation of MCA and ACA branch vessels.  The right vertebral artery bifurcates at the PICA ascending a small branch to the basilar artery. The left vertebral artery demonstrates a high-grade stenosis just beyond the dura in terminates and a small left PICA. 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 This may be the termination of the native occluded left vertebral artery. A  distal left vertebral stump is noted. The basilar artery is small. Both posterior cerebral arteries originate from the basilar tip. There is a moderate proximal stenosis on the right. A left posterior communicating artery contributes. Moderate attenuation of PCA branch vessels is worse on the right.  IMPRESSION: 1. Acute nonhemorrhagic infarct involving the left insular cortex and frontal operculum. 2. The distal left M1 segment is occluded with a single early anterior inferior MCA branch vessel preserved. 3. 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 4. The right vertebral artery is small sudden only a small branch to the basilar artery. 5. Moderate stenosis of the proximal right P1 segment. 6. Diffuse small vessel disease. 7. Encephalomalacia in the anterior left frontal lobe associated with a left frontal craniotomy. 8. 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 These results were called by telephone at the  time of interpretation on 07/31/2015 at 9:40 pm to Dr. Davonna Belling , who verbally acknowledged these results.   Electronically Signed   By: San Morelle M.D.   On: 08/18/2015 21:40   Medications:  Scheduled Meds: . cefTRIAXone (ROCEPHIN)  IV  1 g Intravenous Q24H  .  HYDROmorphone (DILAUDID) injection  1 mg Intravenous Q3H  . levETIRAcetam  500 mg Intravenous Q12H  . sodium chloride  3 mL Intravenous Q12H   Continuous Infusions: . sodium chloride 75 mL/hr at 07/29/15 0736   PRN Meds:.bisacodyl, MUSCLE RUB, ondansetron **OR** ondansetron (ZOFRAN) IV, sodium chloride Assessment/Plan: Principal Problem:   CVA (cerebral infarction) Active Problems:   Bipolar 1 disorder   Cancer associated pain   Hypokalemia   Chronic pain   Lung cancer, primary, with metastasis from lung to other site   Thrombocytopenia   Stroke  [name] is a 64 y.o. 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  Acute stroke -neurology following, appreciate their recommendations -continue KEppra '500mg'$  bid -MRI/MRA findings as above -Lipid profile: TC 119 TG 130 HDL 43 LDL 50 -Hgb a1c pending -PT/OT evaluating and treating  -holding aspirin with pancytopenia -will discuss goals of care with family tomorrow.  Probably need for palliative care consultation  Urinary tract infection: UA with bacteria, leuks, positive nitrites. -ceftriaxone 1gm q24h  Pancytopenia: suspect is due to underlying malignancy -continue to monitor, no need to transfuse at this time.  No signs of bleeding -Dr. Burr Medico to see patient  Metastatic lung cancer -not candidate for chemotherapy at this time per Dr. Burr Medico -Patient previously on hospice but daughter now wishes her to be Full Code -Will try to discuss goals of care with family tomorrow.  Pain control: -dilaudid '1mg'$  q3h  Hypokalemia: 2.8 on admission.  Up to 3.7 after 4 runs of K -continue to monitor -replete as necessary  Dispo: Disposition is deferred at this time, awaiting improvement of current medical problems.    The patient does have a current PCP Orpah Greek Carmelia Roller, MD) and does not need an [facility] hospital follow-up appointment after discharge.  The patient does have transportation limitations that hinder transportation to clinic appointments.    LOS: 1 day   Jule Ser, DO 07/29/2015, 4:56 PM

## 2015-07-30 DIAGNOSIS — N390 Urinary tract infection, site not specified: Secondary | ICD-10-CM

## 2015-07-30 DIAGNOSIS — C3490 Malignant neoplasm of unspecified part of unspecified bronchus or lung: Secondary | ICD-10-CM

## 2015-07-30 DIAGNOSIS — I639 Cerebral infarction, unspecified: Secondary | ICD-10-CM

## 2015-07-30 DIAGNOSIS — D61818 Other pancytopenia: Secondary | ICD-10-CM

## 2015-07-30 DIAGNOSIS — D696 Thrombocytopenia, unspecified: Secondary | ICD-10-CM

## 2015-07-30 DIAGNOSIS — C799 Secondary malignant neoplasm of unspecified site: Secondary | ICD-10-CM

## 2015-07-30 DIAGNOSIS — B9689 Other specified bacterial agents as the cause of diseases classified elsewhere: Secondary | ICD-10-CM

## 2015-07-30 LAB — CBC
HEMATOCRIT: 29.5 % — AB (ref 36.0–46.0)
HEMOGLOBIN: 9.5 g/dL — AB (ref 12.0–15.0)
MCH: 28.8 pg (ref 26.0–34.0)
MCHC: 32.2 g/dL (ref 30.0–36.0)
MCV: 89.4 fL (ref 78.0–100.0)
Platelets: 23 10*3/uL — CL (ref 150–400)
RBC: 3.3 MIL/uL — ABNORMAL LOW (ref 3.87–5.11)
RDW: 19.8 % — AB (ref 11.5–15.5)
WBC: 5.3 10*3/uL (ref 4.0–10.5)

## 2015-07-30 LAB — BASIC METABOLIC PANEL
ANION GAP: 12 (ref 5–15)
BUN: 18 mg/dL (ref 6–20)
CALCIUM: 9.1 mg/dL (ref 8.9–10.3)
CO2: 24 mmol/L (ref 22–32)
Chloride: 103 mmol/L (ref 101–111)
Creatinine, Ser: 0.59 mg/dL (ref 0.44–1.00)
GFR calc non Af Amer: >60 mL/min (ref >60)
Glucose, Bld: 96 mg/dL (ref 65–99)
Potassium: 3.3 mmol/L — ABNORMAL LOW (ref 3.5–5.1)
SODIUM: 139 mmol/L (ref 135–145)

## 2015-07-30 LAB — HEMOGLOBIN A1C
HEMOGLOBIN A1C: 6.4 % — AB (ref 4.8–5.6)
MEAN PLASMA GLUCOSE: 137 mg/dL

## 2015-07-30 MED ORDER — LEVALBUTEROL HCL 0.63 MG/3ML IN NEBU
0.6300 mg | INHALATION_SOLUTION | Freq: Four times a day (QID) | RESPIRATORY_TRACT | Status: DC | PRN
  Administered 2015-07-31: 0.63 mg via RESPIRATORY_TRACT
  Filled 2015-07-30 (×2): qty 3

## 2015-07-30 MED ORDER — POTASSIUM CHLORIDE 10 MEQ/100ML IV SOLN
10.0000 meq | INTRAVENOUS | Status: AC
  Administered 2015-07-30 (×4): 10 meq via INTRAVENOUS
  Filled 2015-07-30 (×4): qty 100

## 2015-07-30 MED ORDER — HYDROMORPHONE HCL 1 MG/ML IJ SOLN
1.0000 mg | INTRAMUSCULAR | Status: DC | PRN
  Administered 2015-07-30 – 2015-08-02 (×9): 1 mg via INTRAVENOUS
  Filled 2015-07-30 (×9): qty 1

## 2015-07-30 MED ORDER — PHENOL 1.4 % MT LIQD: 1.0000 | OROMUCOSAL | Status: DC | PRN

## 2015-07-30 MED ORDER — OXYCODONE HCL 10 MG PO TABS: 10.0000 mg | ORAL_TABLET | ORAL | Status: DC | PRN

## 2015-07-30 MED ORDER — OXYCODONE HCL ER 15 MG PO T12A: 40.0000 mg | EXTENDED_RELEASE_TABLET | Freq: Three times a day (TID) | ORAL | Status: DC

## 2015-07-30 MED ORDER — SCOPOLAMINE 1 MG/3DAYS TD PT72
1.0000 | MEDICATED_PATCH | TRANSDERMAL | Status: DC
  Administered 2015-07-30 – 2015-08-02 (×2): 1.5 mg via TRANSDERMAL
  Filled 2015-07-30 (×4): qty 1

## 2015-07-30 NOTE — Progress Notes (Signed)
RN tried to oral suction pt, but no secretions came out.  Pt lung sounds are rhonchi.  Respiratory paged to nasotracheal suction.  Will continue to monitor.    Fredrich Romans, RN

## 2015-07-30 NOTE — Progress Notes (Signed)
Speech Language Pathology Treatment: Dysphagia  Patient Details Name: [name] [name] MRN: [id number] DOB: [birth year] Today's Date: 07/30/2015 Time: 3343-5686 SLP Time Calculation (min) (ACUTE ONLY): 18 min  Assessment / Plan / Recommendation Clinical Impression  F/u after yesterday's assessment: Pt with improved communication today, remains perseverative, but is able to express intelligible sentences with adequate word-retrieval to make needs known (e.g., asking for bed lowering, lights off, door closed, etc.)  Follows one-step commands; no focal cranial nerve deficits.  However, pt is unable to trigger a spontaneous swallow response -secretions remain difficult for her to manage.  Unable to cough on command due to a likely apraxia; oral suctioning unable to reach subpharyngeal space.  Pt accepted limited trials of ice chips/water with good oral manipulation, but despite max cues, no swallow elicited. Presence of basin evoked a cough and expectoration with greater success than an instruction to complete these actions, but pt could not completely clear her pharynx.  High aspiration at risk at this time - recommend continued NPO; consider temporary enteral feeding if no improvement next 24 hours.    HPI Other Pertinent Information: 64 y.o. female with known lung cancer, pancytopenia, seizure disorder admitted with right facial asymmetry and nonverbal.   H/o left frontal brain tumor s/p resection in 2009.  ACDF 2015. CT negative for acute event.  Per MD notes, Patient's diffusely metastatic lung cancer with skull mets concerning for brain mets, though MRI in April 2016 was negative.   Pertinent Vitals Pain Assessment: No/denies pain  SLP Plan  Continue with current plan of care    Recommendations Diet recommendations: NPO Medication Administration: Via alternative means              Oral Care Recommendations: Oral care QID Plan: Continue with current plan of care   [name] [name] L. Tivis Ringer, [name]  CCC/SLP Pager [phone]      [name] [name] 07/30/2015, 11:16 AM

## 2015-07-30 NOTE — Progress Notes (Signed)
Subjective: Patient seen and examined at bedside and later seen with daughter at bedside as well.  She is better than yesterday able to recognize people in the room, speaking intelligible sentences.  Follows commands, no focal cranial nerve deficits.  States she is in pain and states pain is all over her body.  Still with lots of gurgling, inability to clear secretions.  Respiratory has attempted to suction her but has had limited success.  Objective: Vital signs in last 24 hours: Filed Vitals:   07/30/15 0510 07/30/15 0926 07/30/15 1225 07/30/15 1421  BP: 136/91 158/106  142/88  Pulse: 146 158 153 150  Temp: 98 F (36.7 C) 98.2 F (36.8 C)  97.9 F (36.6 C)  TempSrc: Oral Oral  Oral  Resp: '18 22 21 20  '$ Weight:      SpO2: 98% 95% 100% 97%   Weight change:  No intake or output data in the 24 hours ending 07/30/15 1622 General: afebrile, lying in bed, awake, responding to questions, lots of secretions HEENT: EOMI, no scleral icterus Cardiac: tachycardic, regular rhythm, no rubs, murmurs or gallops Pulm: clear to auscultation bilaterally Abd: soft, appears to be tender to palpation based on response although states she is in pain all over. Neuro: alert, cranial nerves II-XII grossly intact, strength is equal in upper extremities 4/5, able to wiggle her toes, sensation intact  Lab Results: Basic Metabolic Panel:  Recent Labs Lab 07/29/15 0640 07/29/15 0710 07/30/15 0430  NA 136  --  139  K 3.7  --  3.3*  CL 99*  --  103  CO2 26  --  24  GLUCOSE 95  --  96  BUN 21*  --  18  CREATININE 0.63  --  0.59  CALCIUM 9.1  --  9.1  MG  --  2.1  --    Liver Function Tests:  Recent Labs Lab 08/06/2015 1642  AST 32  ALT 13*  ALKPHOS 321*  BILITOT 0.9  PROT 5.4*  ALBUMIN 2.8*   CBC:  Recent Labs Lab 07/30/2015 1642  07/29/15 0640 07/30/15 0430  WBC 6.1  --  4.8 5.3  NEUTROABS 4.7  --   --   --   HGB 9.9*  < > 9.0* 9.5*  HCT 30.8*  < > 28.1* 29.5*  MCV 89.0  --  89.5  89.4  PLT 30*  --  24* 23*  < > = values in this interval not displayed. CBG:  Recent Labs Lab 08/06/2015 1631 07/29/15 0651 07/29/15 1128  GLUCAP 130* 97 84   Fasting Lipid Panel:  Recent Labs Lab 07/29/15 0640  CHOL 119  HDL 43  LDLCALC 50  TRIG 130  CHOLHDL 2.8   Coagulation:  Recent Labs Lab 07/29/2015 1642  LABPROT 17.1*  INR 1.39   Anemia Panel: No results for input(s): VITAMINB12, FOLATE, FERRITIN, TIBC, IRON, RETICCTPCT in the last 168 hours. Urine Drug Screen: Drugs of Abuse     Component Value Date/Time   LABOPIA POSITIVE* 08/03/2015 1852   COCAINSCRNUR NONE DETECTED 07/23/2015 1852   LABBENZ POSITIVE* 08/20/2015 1852   AMPHETMU NONE DETECTED 08/08/2015 1852   THCU POSITIVE* 08/20/2015 1852   LABBARB NONE DETECTED 08/14/2015 1852    Alcohol Level:  Recent Labs Lab 08/06/2015 1642  ETH <5   Urinalysis:  Recent Labs Lab 08/16/2015 1852  COLORURINE AMBER*  LABSPEC 1.016  PHURINE 8.5*  GLUCOSEU NEGATIVE  HGBUR NEGATIVE  BILIRUBINUR NEGATIVE  KETONESUR 15*  PROTEINUR [place]  1.0  NITRITE POSITIVE*  LEUKOCYTESUR LARGE*    Micro Results: No results found for this or any previous visit (from the past 240 hour(s)). Studies/Results: Ct Head Wo Contrast  07/27/2015   CLINICAL DATA:  Code stroke, seizure. Slurred speech. Brain tumor resection 2009  EXAM: CT HEAD WITHOUT CONTRAST  TECHNIQUE: Contiguous axial images were obtained from the base of the skull through the vertex without intravenous contrast.  COMPARISON:  06/15/2015  FINDINGS: Left frontal encephalomalacia reidentified. Mild cortical volume loss noted with proportional ventricular prominence. Areas of periventricular white matter hypodensity are most compatible with small vessel ischemic change. No acute hemorrhage, infarct, or mass lesion is identified. No midline shift. Moderate sphenoid sinusitis. Left frontal craniotomy defect. Areas of relative subjective osteopenia are  reidentified in the skull.  IMPRESSION: Chronic stable findings as above. No new acute finding. Critical Value/emergent results were called by telephone at the time of interpretation on 07/24/2015 at 4:31 pm to Dr. Janann Colonel, who verbally acknowledged these results.   Electronically Signed   By: Conchita Paris M.D.   On: 08/10/2015 16:33   Mr [name] [name] Head Wo Contrast  08/01/2015   CLINICAL DATA:  Acute onset of right-sided facial droop and aphasia. Known seizure disorder. Personal history of brain tumor.  EXAM: MRI HEAD WITHOUT CONTRAST  MRA HEAD WITHOUT CONTRAST  TECHNIQUE: Multiplanar, multiecho pulse sequences of the brain and surrounding structures were obtained without intravenous contrast. Angiographic images of the head were obtained using MRA technique without contrast.  COMPARISON:  CT head without contrast 07/23/2015. MRI brain 02/26/2015.  FINDINGS: MRI HEAD FINDINGS  The diffusion-weighted images demonstrate acute nonhemorrhagic infarct involving the left insular cortex and frontal operculum. No acute hemorrhage or mass lesion is present. Minimal T2 changes are associated with the acute infarct. Or remote encephalomalacia in the anterior left frontal lobe is again noted.  Mild periventricular T2 changes are otherwise stable.  Flow present in the major intracranial arteries. Bilateral lens replacements are noted. Extensive mucosal thickening is present in the left sphenoid sinus and to lesser extent the right sphenoid sinus. The remaining paranasal sinuses and the mastoid air cells are clear.  Remote blood products are associated with the left frontal craniotomy. Extensive osseous metastases are noted in the calvarium.  MRA HEAD FINDINGS  The internal carotid arteries demonstrate minimal atherosclerotic changes the cavernous segments bilaterally, left greater than right. Mild atherosclerotic irregularity is present within the A1 segments bilaterally. The distal left M1 occlusion is present. And early inferior  left MCA branch is maintained. There is mild distal attenuation of MCA and ACA branch vessels.  The right vertebral artery bifurcates at the PICA ascending a small branch to the basilar artery. The left vertebral artery demonstrates a high-grade stenosis just beyond the dura in terminates and a small left PICA. 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 This may be the termination of the native occluded left vertebral artery. A distal left vertebral stump is noted. The basilar artery is small. Both posterior cerebral arteries originate from the basilar tip. There is a moderate proximal stenosis on the right. A left posterior communicating artery contributes. Moderate attenuation of PCA branch vessels is worse on the right.  IMPRESSION: 1. Acute nonhemorrhagic infarct involving the left insular cortex and frontal operculum. 2. The distal left M1 segment is occluded with a single early anterior inferior MCA branch vessel preserved. 3. 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 4. The right vertebral artery is small sudden only a small branch to the basilar artery. 5. Moderate stenosis of the proximal right P1 segment. 6. Diffuse small vessel disease. 7. Encephalomalacia in the anterior left frontal lobe associated with a left frontal craniotomy. 8. 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 These results were called by telephone at the time of interpretation on 08/08/2015 at 9:40 pm to Dr. Davonna Belling , who verbally acknowledged these results.   Electronically Signed   By: San Morelle M.D.   On: 08/08/2015 21:40   Mr Brain Wo Contrast  07/29/2015   CLINICAL DATA:  Acute onset of right-sided facial droop and aphasia. Known seizure disorder. Personal history of brain tumor.  EXAM: MRI HEAD WITHOUT CONTRAST  MRA HEAD WITHOUT CONTRAST  TECHNIQUE: Multiplanar, multiecho pulse sequences of  the brain and surrounding structures were obtained without intravenous contrast. Angiographic images of the head were obtained using MRA technique without contrast.  COMPARISON:  CT head without contrast 07/30/2015. MRI brain 02/26/2015.  FINDINGS: MRI HEAD FINDINGS  The diffusion-weighted images demonstrate acute nonhemorrhagic infarct involving the left insular cortex and frontal operculum. No acute hemorrhage or mass lesion is present. Minimal T2 changes are associated with the acute infarct. Or remote encephalomalacia in the anterior left frontal lobe is again noted.  Mild periventricular T2 changes are otherwise stable.  Flow present in the major intracranial arteries. Bilateral lens replacements are noted. Extensive mucosal thickening is present in the left sphenoid sinus and to lesser extent the right sphenoid sinus. The remaining paranasal sinuses and the mastoid air cells are clear.  Remote blood products are associated with the left frontal craniotomy. Extensive osseous metastases are noted in the calvarium.  MRA HEAD FINDINGS  The internal carotid arteries demonstrate minimal atherosclerotic changes the cavernous segments bilaterally, left greater than right. Mild atherosclerotic irregularity is present within the A1 segments bilaterally. The distal left M1 occlusion is present. And early inferior left MCA branch is maintained. There is mild distal attenuation of MCA and ACA branch vessels.  The right vertebral artery bifurcates at the PICA ascending a small branch to the basilar artery. The left vertebral artery demonstrates a high-grade stenosis just beyond the dura in terminates and a small left PICA. 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 This may be the termination of the native occluded left vertebral artery. A distal left vertebral stump is noted. The basilar artery is small. Both posterior cerebral arteries originate from the basilar tip. There is a  moderate proximal stenosis on the right. A left posterior communicating artery contributes. Moderate attenuation of PCA branch vessels is worse on the right.  IMPRESSION: 1. Acute nonhemorrhagic infarct involving the left insular cortex and frontal operculum. 2. The distal left M1 segment is occluded with a single early anterior inferior MCA branch vessel preserved. 3. 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 4. The right vertebral artery is small sudden only a small branch to the basilar artery. 5. Moderate stenosis of the proximal right P1 segment. 6. Diffuse small vessel disease. 7. Encephalomalacia in the anterior left frontal lobe associated with a left frontal craniotomy. 8. 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 These results were called by telephone at the time of interpretation on 07/25/2015 at 9:40 pm to Dr. Davonna Belling , who verbally acknowledged these results.   Electronically  Signed   By: San Morelle M.D.   On: 08/03/2015 21:40   Medications:  Scheduled Meds: . cefTRIAXone (ROCEPHIN)  IV  1 g Intravenous Q24H  . levETIRAcetam  500 mg Intravenous Q12H  . sodium chloride  3 mL Intravenous Q12H   Continuous Infusions: . sodium chloride 75 mL/hr at 07/29/15 2225   PRN Meds:.bisacodyl, HYDROmorphone (DILAUDID) injection, levalbuterol, MUSCLE RUB, ondansetron **OR** ondansetron (ZOFRAN) IV, phenol, sodium chloride Assessment/Plan: Principal Problem:   CVA (cerebral infarction) Active Problems:   Bipolar 1 disorder   Cancer associated pain   Hypokalemia   Chronic pain   Lung cancer, primary, with metastasis from lung to other site   Thrombocytopenia   Stroke   Antineoplastic chemotherapy induced pancytopenia  [name] [name] is a 64 y.o. female admitted for acute CVA findings on MRI with history of metastatic lung cancer, hypertension, pancytopenia, and left frontal brain tumor status post  resection in 2009.  Acute stroke -SLP recommends continued NPO.  Discussion with daughter today included feeding options moving forward if patient unable to pass swallow -appreciate neurology recommendations -continue KEppra '500mg'$  bid -MRI/MRA findings as above -Lipid profile: TC 119 TG 130 HDL 43 LDL 50 -Hgb a1c 6.4 -PT/OT to evaluate and treat -RT to continue with suctioning for secretions -scopalamine patch for secretions -no antithrombotic therapy prior to admission.  Given advanced malignancy with poor prognosis and severe pancytopenia, will hold off antiplatelet and anticoagulation   Urinary tract infection: UA with bacteria, leuks, positive nitrites. -ceftriaxone 1gm q24h  Pancytopenia: suspect is due to underlying malignancy -continue to monitor, no need to transfuse at this time.  No signs of bleeding  Metastatic lung cancer -not candidate for chemotherapy at this time per Dr. Burr Medico -Patient previously on hospice but daughter now wishes her to be Full Code -Discussed treatment goals with daughter today.  States she would like to see how her mother does with some rehabilitation.  Agreeable to palliative care consult but does not want her mom to go to inpatient hospice -palliative consult placed.  Pain control: -dilaudid '1mg'$  q2h  Hypokalemia: 2.8 on admission.  Up to 3.7 after 4 runs of K -3.3 today, will replete -continue to monitor -replete as necessary  Dispo: Disposition is deferred at this time, awaiting improvement of current medical problems.    The patient does have a current PCP Orpah Greek Carmelia Roller, MD) and does not need an [facility] hospital follow-up appointment after discharge.  The patient does have transportation limitations that hinder transportation to clinic appointments.    LOS: 2 days   Jule Ser, DO 07/30/2015, 4:22 PM

## 2015-07-30 NOTE — Evaluation (Signed)
Physical Therapy Evaluation Patient Details Name: [name] [name] MRN: [id number] DOB: 09/09/1951 Today's Date: 07/30/2015   History of Present Illness  65 y.o. female with known lung cancer, pancytopenia, seizure disorder admitted with right facial asymmetry and nonverbal. H/o left frontal brain tumor s/p resection in 2009. ACDF 2015. CT negative for acute event. Per MD notes, Patient's diffusely metastatic lung cancer with skull mets concerning for brain mets, though MRI in April 2016 was negative.  Clinical Impression  Pt admitted with/for right facial asymmetry and being nonverbal.  Pt currently limited functionally due to the problems listed. ( See problems list.)   Pt will benefit from PT to maximize function and safety in order to get ready for next venue listed below.     Follow Up Recommendations SNF;Supervision for mobility/OOB    Equipment Recommendations  Other (comment) (TBA before going directly home)    Recommendations for Other Services       Precautions / Restrictions Precautions Precautions: Fall;Other (comment) Precaution Comments: Severe aspiration risk Restrictions Weight Bearing Restrictions: No      Mobility  Bed Mobility Overal bed mobility: Needs Assistance Bed Mobility: Rolling;Sidelying to Sit;Sit to Supine Rolling: Min guard Sidelying to sit: Min assist   Sit to supine: Min assist   General bed mobility comments: min assist to power up  Transfers Overall transfer level: Needs assistance Equipment used: 1 person hand held assist Transfers: Sit to/from Stand Sit to Stand: Min assist         General transfer comment: steady assist  Ambulation/Gait             General Gait Details: deferred due to HR rising into the 150's and 160's  Stairs            Wheelchair Mobility    Modified Rankin (Stroke Patients Only) Modified Rankin (Stroke Patients Only) Modified Rankin: Moderately severe disability     Balance Overall  balance assessment: Needs assistance   Sitting balance-Leahy Scale: Fair     Standing balance support: No upper extremity supported;Single extremity supported Standing balance-Leahy Scale: Poor Standing balance comment: needed support for balance                             Pertinent Vitals/Pain Pain Assessment: No/denies pain    Home Living Family/patient expects to be discharged to:: Unsure Living Arrangements: Children   Type of Home: Apartment           Additional Comments: Pt lives with daughter. Bedroom is on second level with access via full flight of stairs. Daughter expressed concern for level of care pt is currently at and ability to provide that care at home. Daughter has young children and may be returning to work soon. Daughter inquiring about skilled care options for further rehab post d/c. Daughter reports she and pt had been receiving hospice/palliative care but they were both dissatisfied, "We want to extend her life." Daughter reports they ended hospice/palliative services about 3 weeks ago.     Prior Function Level of Independence: Independent with assistive device(s);Needs assistance   Gait / Transfers Assistance Needed: rw for functional mobility  ADL's / Homemaking Assistance Needed: daughter supervises shower transfers and assist with IADLs and "keeping her safe."        Hand Dominance        Extremity/Trunk Assessment   Upper Extremity Assessment: Generalized weakness       LUE Deficits / Details: c/o left shoulder pain  at rest   Lower Extremity Assessment: Generalized weakness         Communication   Communication: No difficulties  Cognition Arousal/Alertness: Awake/alert Behavior During Therapy: WFL for tasks assessed/performed Overall Cognitive Status: Difficult to assess                      General Comments General comments (skin integrity, edema, etc.): limited OOB due to elevated HR and pt not feeling well  as a result    Exercises        Assessment/Plan    PT Assessment Patient needs continued PT services  PT Diagnosis Generalized weakness   PT Problem List Decreased strength;Decreased activity tolerance;Decreased balance;Decreased mobility;Decreased coordination;Cardiopulmonary status limiting activity  PT Treatment Interventions Gait training;Stair training;Functional mobility training;Therapeutic activities;Balance training;Neuromuscular re-education;Patient/family education   PT Goals (Current goals can be found in the Care Plan section) Acute Rehab PT Goals Patient Stated Goal: not stated PT Goal Formulation: Patient unable to participate in goal setting Time For Goal Achievement: 08/13/15 Potential to Achieve Goals: Good    Frequency Min 3X/week   Barriers to discharge        Co-evaluation               End of Session   Activity Tolerance: Patient limited by fatigue;Other (comment) (limited by high HR) Patient left: in bed;with call bell/phone within reach;with nursing/sitter in room Nurse Communication: Mobility status         Time: 1700-1720 PT Time Calculation (min) (ACUTE ONLY): 20 min   Charges:   PT Evaluation $Initial PT Evaluation Tier I: 1 Procedure     PT G Codes:        Abdulhadi Stopa, Tessie Fass 07/30/2015, 5:50 PM  07/30/2015  Donnella Sham, PT [phone]  (pager)

## 2015-07-30 NOTE — Progress Notes (Signed)
Pt noted resting in bed at this time. Oral suctioning done tolerated well. An order was placed this am for respiratory consult because pt wasn't able to cough up secretions. Lungs assessed, rhonchi bilaterally.  MD had been notified and also made aware of increased HR called in by telemetry. Pt kept refusing to be suctioned until her daughter was able to encourage her to atleast allow attempt. No noted distress. Pt denies SOB, will monitor.

## 2015-07-30 NOTE — Progress Notes (Signed)
Patient having difficulty clearing secretions, poor weak cough.  Audible pooling of secretions in the back of throat.  Patient NT suctioned at patient and family request.  Patient tolerated well, minimal bleeding.  RN at bedside.  Moderate amount of creamy secretions removed from airway.  Patient stable throughout.

## 2015-07-30 NOTE — Progress Notes (Signed)
RT attempted to NTS patient. During procedure patient was attempting to pull catheter from nose and turn head. RT suctioned minimal secretions but not nearly enough. RT will continue to monitor.

## 2015-07-31 DIAGNOSIS — Z515 Encounter for palliative care: Secondary | ICD-10-CM

## 2015-07-31 DIAGNOSIS — K117 Disturbances of salivary secretion: Secondary | ICD-10-CM | POA: Insufficient documentation

## 2015-07-31 LAB — TYPE AND SCREEN
ABO/RH(D): O POS
ANTIBODY SCREEN: NEGATIVE

## 2015-07-31 LAB — GLUCOSE, CAPILLARY: GLUCOSE-CAPILLARY: 93 mg/dL (ref 65–99)

## 2015-07-31 MED ORDER — POTASSIUM CHLORIDE 10 MEQ/100ML IV SOLN
10.0000 meq | INTRAVENOUS | Status: AC
  Administered 2015-07-31 (×4): 10 meq via INTRAVENOUS
  Filled 2015-07-31 (×4): qty 100

## 2015-07-31 NOTE — Progress Notes (Addendum)
Subjective: Patient continues to have generalized pain, states "all over". Remains sinus tach. We had family discussion yesterday and consulted palliative care team to help with with symptom management. Daughter still trying to decide her GOC. She is not there today.   Objective: Vital signs in last 24 hours: Filed Vitals:   07/30/15 2121 07/31/15 0151 07/31/15 0544 07/31/15 0922  BP: 148/91 153/93 155/87 133/84  Pulse: 139 133 132 142  Temp: 98.3 F (36.8 C) 98.4 F (36.9 C) 98.6 F (37 C) 97.6 F (36.4 C)  TempSrc: Oral Oral Oral Oral  Resp: '20 20 20 18  '$ Weight:      SpO2: 98% 100% 100% 100%   Weight change:  No intake or output data in the 24 hours ending 07/31/15 1135 General: lying in bed, awake, speech is not clear but does answer questions HEENT: EOMI, no scleral icterus Cardiac: RRR, no rubs, murmurs or gallops Pulm: has some coarse breath sounds diffusely.  Abd: soft, appears to be tender to palpation based on response, nondistended, BS present Neuro: alert, cranial nerves II-XII grossly intact, strength is equal in upper extremities 4/5, unable to perform finger to nose testing, decreased strength in right lower extremity with inability to hold leg up against gravity.  Lab Results: Basic Metabolic Panel:  Recent Labs Lab 07/29/15 0640 07/29/15 0710 07/30/15 0430  NA 136  --  139  K 3.7  --  3.3*  CL 99*  --  103  CO2 26  --  24  GLUCOSE 95  --  96  BUN 21*  --  18  CREATININE 0.63  --  0.59  CALCIUM 9.1  --  9.1  MG  --  2.1  --    Liver Function Tests:  Recent Labs Lab 07/27/2015 1642  AST 32  ALT 13*  ALKPHOS 321*  BILITOT 0.9  PROT 5.4*  ALBUMIN 2.8*   CBC:  Recent Labs Lab 07/30/2015 1642  07/29/15 0640 07/30/15 0430  WBC 6.1  --  4.8 5.3  NEUTROABS 4.7  --   --   --   HGB 9.9*  < > 9.0* 9.5*  HCT 30.8*  < > 28.1* 29.5*  MCV 89.0  --  89.5 89.4  PLT 30*  --  24* 23*  < > = values in this interval not displayed. CBG:  Recent  Labs Lab 08/17/2015 1631 07/29/15 0651 07/29/15 1128 07/31/15 1101  GLUCAP 130* 97 84 93   Fasting Lipid Panel:  Recent Labs Lab 07/29/15 0640  CHOL 119  HDL 43  LDLCALC 50  TRIG 130  CHOLHDL 2.8   Coagulation:  Recent Labs Lab 08/07/2015 1642  LABPROT 17.1*  INR 1.39   Anemia Panel: No results for input(s): VITAMINB12, FOLATE, FERRITIN, TIBC, IRON, RETICCTPCT in the last 168 hours. Urine Drug Screen: Drugs of Abuse     Component Value Date/Time   LABOPIA POSITIVE* 08/16/2015 1852   COCAINSCRNUR NONE DETECTED 08/13/2015 1852   LABBENZ POSITIVE* 07/29/2015 1852   AMPHETMU NONE DETECTED 08/20/2015 1852   THCU POSITIVE* 08/01/2015 1852   LABBARB NONE DETECTED 08/10/2015 1852    Alcohol Level:  Recent Labs Lab 07/25/2015 1642  ETH <5   Urinalysis:  Recent Labs Lab 08/15/2015 1852  COLORURINE AMBER*  LABSPEC 1.016  PHURINE 8.5*  GLUCOSEU NEGATIVE  HGBUR NEGATIVE  BILIRUBINUR NEGATIVE  KETONESUR 15*  PROTEINUR 30*  UROBILINOGEN 1.0  NITRITE POSITIVE*  LEUKOCYTESUR LARGE*    Micro Results: No results found for this  or any previous visit (from the past 240 hour(s)). Studies/Results: No results found. Medications:  Scheduled Meds: . cefTRIAXone (ROCEPHIN)  IV  1 g Intravenous Q24H  . levETIRAcetam  500 mg Intravenous Q12H  . scopolamine  1 patch Transdermal Q72H  . sodium chloride  3 mL Intravenous Q12H   Continuous Infusions: . sodium chloride 75 mL/hr at 07/31/15 0532   PRN Meds:.bisacodyl, HYDROmorphone (DILAUDID) injection, levalbuterol, MUSCLE RUB, ondansetron **OR** ondansetron (ZOFRAN) IV, phenol, sodium chloride Assessment/Plan: Principal Problem:   CVA (cerebral infarction) Active Problems:   Bipolar 1 disorder   Cancer associated pain   Hypokalemia   Chronic pain   Lung cancer, primary, with metastasis from lung to other site   Thrombocytopenia   Stroke   Antineoplastic chemotherapy induced pancytopenia  Ms. Akylah Hascall is a 64  y.o. 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  Acute stroke - left MCA territory, left VA occlusion. ikely 2/2 to hypercoag state 2/2 to her malignancy   -neurology following, appreciate their recommendations -continue KEppra '500mg'$  bid - LDL 50. Will not do statin. Hgb a1c 6.4.  -PT/OT recommended SNF. -holding aspirin with pancytopenia -had GOC discussion with daughter yesterday. Has not made up her mind yet. Will get in touch with her again. Also consulted palliative care for symptom mamagement.  Swallow dysfunction - 2/2 to stroke. NPO now per SLP recs. - continue to work with SLP. Depending on family's wishes, may need NGT and then Gtube.  Urinary tract infection: UA with bacteria, leuks, positive nitrites. -ceftriaxone 1gm q24h (9/7 >> 9/11).  Pancytopenia: suspect is due to underlying malignancy -continue to monitor, no need to transfuse at this time.  No signs of bleeding - appreciate Dr. Ernestina Penna recs.   Metastatic lung cancer -not candidate for chemotherapy at this time per Dr. Burr Medico -Patient previously on hospice but daughter now wishes her to be Full Code. Continue discussing [place].   Pain control: -dilaudid '1mg'$  q2h prn  Hypokalemia:  -repleting as necessary  Dispo: Disposition is deferred at this time, awaiting improvement of current medical problems.    The patient does have a current PCP Orpah Greek Carmelia Roller, MD) and does not need an [facility] hospital follow-up appointment after discharge.  The patient does have transportation limitations that hinder transportation to clinic appointments.    LOS: 3 days   Dellia Nims, MD 07/31/2015, 11:35 AM

## 2015-07-31 NOTE — Consult Note (Signed)
Consultation Note Date: 07/31/2015   Patient Name: [name] [name]  DOB: 02-24-1951  MRN: [id number]  Age / Sex: 64 y.o., female   PCP: Harvie Junior, MD Referring Physician: Aldine Contes, MD  Reason for Consultation: patient with advanced cancer, now with new stroke. Consult for palliative management.   Palliative Care Assessment and Plan Summary of Established Goals of Care and Medical Treatment Preferences  [name] is a 64 y.o. 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 The patient is well known to the palliative service. The patient was enrolled in hospice of [facility]. The patient was admitted with acute renal failure and altered mental status last month. The patient's daughter [name] [name] is her HCPOA agent. The patient is now admitted with a new stroke. Active issues include the patient being deemed high risk for aspiration, she continues with NPO, difficulty managing secretions, pain. Hence, palliative consult has been placed.   [name] is resting in bed, she is awake, she has a facial droop, she recognizes me in the room. Her daughter is not at the bedside. The patient is dysarthric, she is with increased salivary secretions.   Call placed and discussed in detail with Ms Raseel Jans. She wishes to see how the patient will do with PT efforts and also is concerned about her NPO status. She is also considering long term feeding options such as a PEG tube for the patient. Her goals are for any and all life maintaining/ life prolonging therapies to be applied towards her mother's care.  Ms [name] [name] recalls her conversations with oncologist Dr Burr Medico. Ms [name] [name] maintains that she does not want either hospice or palliative services at all for her mother. Code status is to remain FULL code. Attempted to gently elicit further goals but was  unsuccessful.   We will sign off, thank you for the consult.   Contacts/Participants in Discussion: Primary Decision Maker:    Patient's daughter Natha Guin at [phone]  HCPOA: yes   daughter [name] [name]   Code Status/Advance Care Planning:  Full code   Symptom Management:   Secretions: agree with short term trial of scopolamine patch, would not recommend long term, watch for anti cholinergic side effects of dry mouth, urinary retention. One may also consider robinul IV short term, but these are temporary measures. Patient with stroke, now unable to manage secretions.    Palliative Prophylaxis: noted   Additional Recommendations (Limitations, Scope, Preferences):  Follow up patient's response with PT  Continue to explore enteral nutrition options with daughter: trial of Dobbhoff, ? PEG.  Psycho-social/Spiritual:   Support System: yes, daughter [name] [name]  Desire for further Chaplaincy support:no  Prognosis: < 6 months  Discharge Planning:  daughter wishes SNF rehab, she does not want hospice     Values: life prolonging life maintaining therapies as discussed with patient's daughter [name] [name] at [phone]  Life limiting illness: lung cancer, stroke       Chief Complaint/History of Present Illness: stroke   Primary Diagnoses  Present on Admission:  . CVA (cerebral infarction) . Hypokalemia . Chronic pain . Cancer associated pain . Bipolar 1 disorder . Thrombocytopenia  Palliative Review of Systems: completed I have reviewed the medical record, interviewed the patient and family, and examined the patient. The following aspects are pertinent.  Past Medical History  Diagnosis Date  . Hypertension   . Bipolar 1  disorder   . Heart murmur   . Sciatica   . Arthritis   . Anxiety   . Diverticulosis   . Chronic low back pain   . DDD (degenerative disc disease), lumbar   . Chronic shoulder pain   . Chronic neck pain   . Hepatitis B     Unclear when initially  diagnosed, labs in Epic from 03/09/13  . Hepatitis C     Unclear when initially diagnosed, labs in Epic from 03/09/13  . C. difficile diarrhea     April and February 2014  . COPD (chronic obstructive pulmonary disease)   . GERD (gastroesophageal reflux disease)   . H/O hiatal hernia   . Headache(784.0)     hx  . PONV (postoperative nausea and vomiting)   . Seizures     on meds  . Diabetes mellitus   . Adenocarcinoma of lung, stage 1   . GERD (gastroesophageal reflux disease)    Social History   Social History  . Marital Status: Widowed    Spouse Name: N/A  . Number of Children: N/A  . Years of Education: N/A   Occupational History  . n/a     patient draws SNN/[employer]   Social History Main Topics  . Smoking status: Former Smoker -- 0.25 packs/day for 10 years    Types: Cigarettes    Quit date: 07/07/2013  . Smokeless tobacco: Never Used     Comment: Smokes pk q 3 days   marajuna aug  . Alcohol Use: No     Comment: quit 33yr ago  . Drug Use: Yes    Special: Hydrocodone, Marijuana     Comment: "maybe about once a month"  . Sexual Activity: No   Other Topics Concern  . None   Social History Narrative   Family History  Problem Relation Age of Onset  . Hypertension Father     deceased  . Diabetes Father   . Heart disease Father   . Hyperlipidemia Father   . Hypertension Mother   . Hyperlipidemia Mother   . Diabetes Mother   . Hypertension Sister   . Hyperlipidemia Sister   . Hypertension Brother   . Hyperlipidemia Brother    Scheduled Meds: . cefTRIAXone (ROCEPHIN)  IV  1 g Intravenous Q24H  . levETIRAcetam  500 mg Intravenous Q12H  . potassium chloride  10 mEq Intravenous Q1 Hr x 4  . scopolamine  1 patch Transdermal Q72H  . sodium chloride  3 mL Intravenous Q12H   Continuous Infusions: . sodium chloride 75 mL/hr at 07/31/15 0532   PRN Meds:.bisacodyl, HYDROmorphone (DILAUDID) injection, levalbuterol, MUSCLE RUB, ondansetron **OR** ondansetron (ZOFRAN) IV,  phenol, sodium chloride Medications Prior to Admission:  Prior to Admission medications   Medication Sig Start Date End Date Taking? Authorizing Provider  ALPRAZolam (XANAX) 0.25 MG tablet Take 1 tablet (0.25 mg total) by mouth at bedtime as needed for anxiety. 07/07/15  Yes OVelvet Bathe MD  magnesium hydroxide (MILK OF MAGNESIA) 800 MG/5ML suspension Take 30 mLs by mouth daily as needed for constipation.   Yes Historical Provider, MD  Menthol-Methyl Salicylate (MUSCLE RUB) 10-15 % CREA Apply 1 application topically daily as needed for muscle pain.   Yes Historical Provider, MD  OxyCODONE (OXYCONTIN) 20 mg T12A 12 hr tablet Take 2 tablets (40 mg total) by mouth every 8 (eight) hours. 07/12/15  Yes YTruitt Merle MD  Oxycodone HCl 10 MG TABS Take 1 tablet (10 mg total) by mouth every 4 (  four) hours as needed (moderate pain, severe pain, breakthrough pain). 07/12/15  Yes Truitt Merle, MD  Sennosides-Docusate Sodium (SENNA S PO) Take 2 tablets by mouth 2 (two) times daily. 06/21/15  Yes Historical Provider, MD  DULoxetine (CYMBALTA) 30 MG capsule Take 1 capsule (30 mg total) by mouth daily. Patient not taking: Reported on 07/27/2015 05/06/15   Modena Jansky, MD  feeding supplement (BOOST / RESOURCE BREEZE) LIQD Take 1 Container by mouth 3 (three) times daily between meals. Patient not taking: Reported on 08/03/2015 07/07/15   Velvet Bathe, MD  metoCLOPramide (REGLAN) 5 MG tablet Take 1 tablet (5 mg total) by mouth 3 (three) times daily before meals. Patient not taking: Reported on 08/15/2015 03/12/15   Oswald Hillock, MD  omeprazole (PRILOSEC) 20 MG capsule Take 1 capsule (20 mg total) by mouth 2 (two) times daily before a meal. Patient not taking: Reported on 08/15/2015 03/26/15   Tyler Pita, MD  ondansetron (ZOFRAN-ODT) 8 MG disintegrating tablet Take 1 tablet (8 mg total) by mouth every 6 (six) hours as needed for nausea or vomiting. Patient not taking: Reported on 08/17/2015 04/28/15   Truitt Merle, MD   Allergies    Allergen Reactions  . Haldol [Haloperidol Decanoate] Swelling    tongue swelling  . Acetaminophen Other (See Comments)    Stomach pain  . Ibuprofen Other (See Comments)    Stomach pain  . Metronidazole Other (See Comments)    Severe stomach upset--was instructed to avoid Flagyl.  . Morphine And Related Hives    Tolerates hydromorphone.  Marland Kitchen Penicillins Rash   CBC:    Component Value Date/Time   WBC 5.3 07/30/2015 0430   WBC 4.6 07/12/2015 1216   HGB 9.5* 07/30/2015 0430   HGB 7.3* 07/12/2015 1216   HCT 29.5* 07/30/2015 0430   HCT 23.3* 07/12/2015 1216   PLT 23* 07/30/2015 0430   PLT 13* 07/12/2015 1216   MCV 89.4 07/30/2015 0430   MCV 90.0 07/12/2015 1216   NEUTROABS 4.7 07/29/2015 1642   NEUTROABS 3.0 07/12/2015 1216   LYMPHSABS 1.0 07/25/2015 1642   LYMPHSABS 1.0 07/12/2015 1216   MONOABS 0.4 07/27/2015 1642   MONOABS 0.4 07/12/2015 1216   EOSABS 0.0 08/07/2015 1642   EOSABS 0.0 07/12/2015 1216   BASOSABS 0.0 08/11/2015 1642   BASOSABS 0.1 07/12/2015 1216   Comprehensive Metabolic Panel:    Component Value Date/Time   NA 139 07/30/2015 0430   NA 141 07/12/2015 1216   K 3.3* 07/30/2015 0430   K 4.2 07/12/2015 1216   CL 103 07/30/2015 0430   CO2 24 07/30/2015 0430   CO2 28 07/12/2015 1216   BUN 18 07/30/2015 0430   BUN 62.0* 07/12/2015 1216   CREATININE 0.59 07/30/2015 0430   CREATININE 2.2* 07/12/2015 1216   GLUCOSE 96 07/30/2015 0430   GLUCOSE 97 07/12/2015 1216   CALCIUM 9.1 07/30/2015 0430   CALCIUM 9.3 07/12/2015 1216   AST 32 08/04/2015 1642   AST 26 07/12/2015 1216   ALT 13* 08/13/2015 1642   ALT 13 07/12/2015 1216   ALKPHOS 321* 07/31/2015 1642   ALKPHOS 302* 07/12/2015 1216   BILITOT 0.9 07/27/2015 1642   BILITOT 0.55 07/12/2015 1216   PROT 5.4* 07/25/2015 1642   PROT 5.9* 07/12/2015 1216   ALBUMIN 2.8* 08/04/2015 1642   ALBUMIN 2.9* 07/12/2015 1216    Physical Exam: Vital Signs: BP 133/84 mmHg  Pulse 142  Temp(Src) 97.6 F (36.4 C)  (Oral)  Resp 18  Wt 43.9  kg (96 lb 12.5 oz)  SpO2 100% SpO2: SpO2: 100 % O2 Device: O2 Device: Not Delivered O2 Flow Rate:   Intake/output summary: No intake or output data in the 24 hours ending 07/31/15 1156 LBM: Last BM Date: 07/30/15 Baseline Weight: Weight: 43.9 kg (96 lb 12.5 oz) Most recent weight: Weight: 43.9 kg (96 lb 12.5 oz)  Exam Findings:   facial droop Congested breath sounds S1 S2 Abdomen soft No edema Awake recognizes me mumbles  Aphasic dysarthric  Increased secretions                        Palliative Performance Scale: 10% Additional Data Reviewed: Recent Labs     07/29/15  0640  07/30/15  0430  WBC  4.8  5.3  HGB  9.0*  9.5*  PLT  24*  23*  NA  136  139  BUN  21*  18  CREATININE  0.63  0.59     Time In: 1000 Time Out: 1100 Time Total: 60 min  Greater than 50%  of this time was spent counseling and coordinating care related to the above assessment and plan.  Signed by: Loistine Chance, MD [place], MD  07/31/2015, 11:56 AM  Please contact Palliative Medicine Team phone at [phone] for questions and concerns.

## 2015-07-31 NOTE — Progress Notes (Signed)
Daughter noted at this time in pts room discussing plan of care. Daughter informed this nurse that she had concerns about her mother and how speech was able to determine if her mother was able to swallow. She was told that Speech has evaluated her mother today.  She then stated, "Lastnight my mother accidentally grabbed my cup off the table and drunk some of my tea. I tried to suction her myself." Pt's daughter was educated on the importance of not giving her mother anything by mouth at this time. She was informed that due to aspiration precautions, it was not safe per speech therapist.  Her daughter with verbal understanding agreed to not give her mother anything by mouth. Will monitor.

## 2015-07-31 NOTE — Progress Notes (Signed)
NTS pt received mod thick tan secretions. Pt tolerated well. Rt to monitor

## 2015-07-31 NOTE — Progress Notes (Signed)
Speech Language Pathology Treatment: Dysphagia  Patient Details Name: [name] [name] MRN: [id number] DOB: 03/09/1951 Today's Date: 07/31/2015 Time: 1694-5038 SLP Time Calculation (min) (ACUTE ONLY): 16 min  Assessment / Plan / Recommendation Clinical Impression  ST follow up to assess for possibility of PO's.  The patient with wet vocal quality when ST entered the room suggesting that she is having issues managing her own secretions.  Her volitional cough is very weak and she is unable to clear the material.  Limited oral motor exam was completed.  The patient's lingual and labial range of motion is good.  Due to difficulty following commands strength was unable to be assessed.   Today she was presented with some ice chips.  She manipulated the material orally but was slow.  Tongue pumping to trigger swallow was noted.  Today the patient was able to trigger a pharyngeal swallow which was delayed with poor hyo-laryngeal excursion.  Increased wet vocal quality was noted given 2 ice chips.  Recommend keeping the patient NPO.  Non oral nutrition for at least the short term needs to be considered.   ST will continue to work with the patient.     HPI Other Pertinent Information: 64 y.o. female with known lung cancer, pancytopenia, seizure disorder admitted with right facial asymmetry and nonverbal.   H/o left frontal brain tumor s/p resection in 2009.  ACDF 2015. CT negative for acute event.  Per MD notes, Patient's diffusely metastatic lung cancer with skull mets concerning for brain mets, though MRI in April 2016 was negative.         Recommendations Diet recommendations: NPO Medication Administration: Via alternative means              Oral Care Recommendations: Oral care QID    GO     Lamar Sprinkles 07/31/2015, 1:41 PM  Shelly Flatten, [place], [facility] SLP [phone]

## 2015-07-31 NOTE — Progress Notes (Signed)
Oral suctioning provided but unable to fully clear. Called respiratory therapy who came to room to NTS. Pt tolerated well. Pt resting at this time. Call bell within reach. Will continue to monitor.

## 2015-08-01 ENCOUNTER — Inpatient Hospital Stay (HOSPITAL_COMMUNITY): Payer: Medicare HMO

## 2015-08-01 LAB — GLUCOSE, CAPILLARY: Glucose-Capillary: 107 mg/dL — ABNORMAL HIGH (ref 65–99)

## 2015-08-01 LAB — BASIC METABOLIC PANEL
Anion gap: 14 (ref 5–15)
BUN: 24 mg/dL — ABNORMAL HIGH (ref 6–20)
CALCIUM: 9.5 mg/dL (ref 8.9–10.3)
CHLORIDE: 112 mmol/L — AB (ref 101–111)
CO2: 18 mmol/L — AB (ref 22–32)
CREATININE: 0.72 mg/dL (ref 0.44–1.00)
GFR calc non Af Amer: >60 mL/min (ref >60)
GLUCOSE: 99 mg/dL (ref 65–99)
Potassium: 3.9 mmol/L (ref 3.5–5.1)
Sodium: 144 mmol/L (ref 135–145)

## 2015-08-01 LAB — CBC
HEMATOCRIT: 28.1 % — AB (ref 36.0–46.0)
HEMOGLOBIN: 9 g/dL — AB (ref 12.0–15.0)
MCH: 28.8 pg (ref 26.0–34.0)
MCHC: 32 g/dL (ref 30.0–36.0)
MCV: 89.8 fL (ref 78.0–100.0)
Platelets: 15 10*3/uL — CL (ref 150–400)
RBC: 3.13 MIL/uL — ABNORMAL LOW (ref 3.87–5.11)
RDW: 20.5 % — AB (ref 11.5–15.5)
WBC: 4.7 10*3/uL (ref 4.0–10.5)

## 2015-08-01 LAB — BLOOD GAS, ARTERIAL
Acid-base deficit: 1.9 mmol/L (ref 0.0–2.0)
Bicarbonate: 21.6 mEq/L (ref 20.0–24.0)
FIO2: 0.21
O2 SAT: 92.7 %
PH ART: 7.446 (ref 7.350–7.450)
Patient temperature: 98.6
TCO2: 22.6 mmol/L (ref 0–100)
pCO2 arterial: 31.9 mmHg — ABNORMAL LOW (ref 35.0–45.0)
pO2, Arterial: 66.3 mmHg — ABNORMAL LOW (ref 80.0–100.0)

## 2015-08-01 LAB — PROTIME-INR
INR: 1.63 — ABNORMAL HIGH (ref 0.00–1.49)
Prothrombin Time: 19.4 seconds — ABNORMAL HIGH (ref 11.6–15.2)

## 2015-08-01 MED ORDER — SODIUM CHLORIDE 0.9 % IV SOLN: Freq: Once | INTRAVENOUS | Status: DC

## 2015-08-01 MED ORDER — HYDRALAZINE HCL 20 MG/ML IJ SOLN
10.0000 mg | INTRAMUSCULAR | Status: DC | PRN
  Administered 2015-08-02: 10 mg via INTRAVENOUS
  Administered 2015-08-02: 20 mg via INTRAVENOUS
  Administered 2015-08-03: 10 mg via INTRAVENOUS
  Filled 2015-08-01 (×3): qty 1

## 2015-08-01 MED ORDER — LABETALOL HCL 5 MG/ML IV SOLN
20.0000 mg | INTRAVENOUS | Status: DC | PRN
  Administered 2015-08-01 (×2): 20 mg via INTRAVENOUS
  Filled 2015-08-01: qty 4

## 2015-08-01 MED ORDER — SALINE SPRAY 0.65 % NA SOLN
2.0000 | NASAL | Status: DC | PRN
  Administered 2015-08-03: 2 via NASAL
  Filled 2015-08-01: qty 44

## 2015-08-01 MED ORDER — SODIUM CHLORIDE 0.9 % IV SOLN
Freq: Once | INTRAVENOUS | Status: AC
  Administered 2015-08-01: 250 mL via INTRAVENOUS

## 2015-08-01 MED ORDER — DEXTROSE-NACL 5-0.45 % IV SOLN
INTRAVENOUS | Status: DC
  Administered 2015-08-01 – 2015-08-02 (×2): via INTRAVENOUS
  Administered 2015-08-02: 1000 mL via INTRAVENOUS

## 2015-08-01 MED ORDER — OXYMETAZOLINE HCL 0.05 % NA SOLN
1.0000 | Freq: Two times a day (BID) | NASAL | Status: DC
  Administered 2015-08-01 – 2015-08-04 (×5): 1 via NASAL
  Filled 2015-08-01: qty 15

## 2015-08-01 MED ORDER — LABETALOL HCL 5 MG/ML IV SOLN
20.0000 mg | Freq: Once | INTRAVENOUS | Status: DC
  Filled 2015-08-01: qty 4

## 2015-08-01 NOTE — Progress Notes (Signed)
Notified by RN that Rapid Response nurse concerned about patient condition.  Patient continues to bleed from nose and mouth, and gurgles with respiration.  RN states patient is protecting her airway, but is concerned that she is becoming more somnolent.  She is resistant to suctioning by nursing staff.  Patient denies pain, but unable to relate whether it is getting better or worse.    Filed Vitals:   08/01/15 1830 08/01/15 1900 08/01/15 1930 08/01/15 2015  BP: 166/100 162/93 150/88 172/95  Pulse: 149 145 145 141  Temp: 98.3 F (36.8 C)   98.2 F (36.8 C)  TempSrc: Oral   Oral  Resp: 32 27 31 35  Height:      Weight:      SpO2: 99% 98% 98% 100%   Gen: Frail, lying in bed, NAD. Bright red blood on pillow HEENT: Crusted blood within nares and on lips.  Bright red blood coating tongue Pulm: Diffuse coarse rhonchi throughout all lung fields. CV: Tachycardic. No adventitious sounds appreciated  Lab Results  Component Value Date/Time   PLT 15* 08/01/2015 04:50 AM   PLT 13* 07/12/2015 12:16 PM    A/P: Spontaneous bleeding 2/2 malignancy associated thrombocytopenia.  Patient currently protecting airway, satting 100% on 2L [place].  Patient currently in step down, receiving 1 unit platelets.  - PCCM consult - ENT consult: Afrin, humidified O2, nasal saline spray - BP control with Labetalol 20 mg IV - Transfuse 2 units platelets in addition to unit currently being transferred

## 2015-08-01 NOTE — Progress Notes (Signed)
Tech reported to this nurse increased bleeding from pt nose and mouth. Reported to pts room noted red blood in mouth and nose.  Pt unable to clear secretions, would not allow this nurse to suction mouth grabbing tube. Pt labored and restless RR- 22, BP- 167/103, HR-136. Called Rapid Response who came to do assessment.  Although pt denied SOB and 02 sat 100% she stated that she was uncomfortable. Pt placed on 02 via nasal cannula with humidification. MD paged but while waiting for him to call back he arrived at bedside. Pt to be transferred to SDU. Family at bedside at this time. Will continue to monitor until room is available.

## 2015-08-01 NOTE — Progress Notes (Signed)
Pt was repositioned in bed with HOB elevated when pt coughed once moderate amount of bright red secretions. Pt has a weak cough and has difficulty clearing secretions, oral suctioning provided. Family at bedside. Md arrived to unit and notified. Pt stable, no noted distress. Will monitor.

## 2015-08-01 NOTE — Progress Notes (Signed)
Subjective: Patient seen and examined at bedside shortly after receiving pain medications.  Daughter or other family members not currently present.  She is sleepy today but still complains of having pain.  When asked where she is having pain, states "all over" and that she "wants to go home".   Family still trying to decide goals of care.  Speech seems improved from previous as do her secretions with less gurgling.   Objective: Vital signs in last 24 hours: Filed Vitals:   07/31/15 2229 08/01/15 0153 08/01/15 0616 08/01/15 0925  BP: 145/81 143/93 136/90 151/81  Pulse: 138 134 124   Temp: 99.1 F (37.3 C) 98.4 F (36.9 C) 98.2 F (36.8 C) 98.3 F (36.8 C)  TempSrc: Oral Oral Oral Oral  Resp: '20 20 20 20  '$ Weight:      SpO2: 96% 99% 99% 99%   Weight change:  No intake or output data in the 24 hours ending 08/01/15 1021 General: lying in bed, awake but sleepy, speech is more clear today, able to answer simple questions, follow basic commands HEENT: EOMI, no scleral icterus Cardiac: regular rhythm, rate is still tachycardic, no rubs, murmurs or gallops Pulm: has some coarse breath sounds diffusely.  Abd: soft, appears to be tender to palpation based on response.  Tenderness is not specific to one particular area of the abdomen.  She is nondistended, BS present Neuro: alert, cranial nerves II-XII grossly intact, strength is equal in upper extremities 4/5, unable to perform finger to nose testing, decreased strength in right lower extremity with inability to hold leg up against gravity.  Lab Results: Basic Metabolic Panel:  Recent Labs Lab 07/29/15 0710 07/30/15 0430 08/01/15 0450  NA  --  139 144  K  --  3.3* 3.9  CL  --  103 112*  CO2  --  24 18*  GLUCOSE  --  96 99  BUN  --  18 24*  CREATININE  --  0.59 0.72  CALCIUM  --  9.1 9.5  MG 2.1  --   --    Liver Function Tests:  Recent Labs Lab 08/15/2015 1642  AST 32  ALT 13*  ALKPHOS 321*  BILITOT 0.9  PROT 5.4*    ALBUMIN 2.8*   CBC:  Recent Labs Lab 08/14/2015 1642  07/30/15 0430 08/01/15 0450  WBC 6.1  < > 5.3 4.7  NEUTROABS 4.7  --   --   --   HGB 9.9*  < > 9.5* 9.0*  HCT 30.8*  < > 29.5* 28.1*  MCV 89.0  < > 89.4 89.8  PLT 30*  < > 23* 15*  < > = values in this interval not displayed. CBG:  Recent Labs Lab 08/14/2015 1631 07/29/15 0651 07/29/15 1128 07/31/15 1101 08/01/15 0757  GLUCAP 130* 97 84 93 107*   Fasting Lipid Panel:  Recent Labs Lab 07/29/15 0640  CHOL 119  HDL 43  LDLCALC 50  TRIG 130  CHOLHDL 2.8   Coagulation:  Recent Labs Lab 08/02/2015 1642  LABPROT 17.1*  INR 1.39   Anemia Panel: No results for input(s): VITAMINB12, FOLATE, FERRITIN, TIBC, IRON, RETICCTPCT in the last 168 hours. Urine Drug Screen: Drugs of Abuse     Component Value Date/Time   LABOPIA POSITIVE* 08/19/2015 1852   COCAINSCRNUR NONE DETECTED 08/15/2015 1852   LABBENZ POSITIVE* 08/08/2015 1852   AMPHETMU NONE DETECTED 08/09/2015 1852   THCU POSITIVE* 08/01/2015 1852   LABBARB NONE DETECTED 08/12/2015 1852  Alcohol Level:  Recent Labs Lab 07/25/2015 1642  ETH <5   Urinalysis:  Recent Labs Lab 08/11/2015 1852  COLORURINE AMBER*  LABSPEC 1.016  PHURINE 8.5*  GLUCOSEU NEGATIVE  HGBUR NEGATIVE  BILIRUBINUR NEGATIVE  KETONESUR 15*  PROTEINUR 30*  UROBILINOGEN 1.0  NITRITE POSITIVE*  LEUKOCYTESUR LARGE*    Micro Results: No results found for this or any previous visit (from the past 240 hour(s)). Studies/Results: No results found. Medications:  Scheduled Meds: . cefTRIAXone (ROCEPHIN)  IV  1 g Intravenous Q24H  . levETIRAcetam  500 mg Intravenous Q12H  . scopolamine  1 patch Transdermal Q72H  . sodium chloride  3 mL Intravenous Q12H   Continuous Infusions: . sodium chloride 75 mL/hr at 07/31/15 0532   PRN Meds:.bisacodyl, HYDROmorphone (DILAUDID) injection, levalbuterol, MUSCLE RUB, ondansetron **OR** ondansetron (ZOFRAN) IV, phenol, sodium  chloride Assessment/Plan: Principal Problem:   CVA (cerebral infarction) Active Problems:   Bipolar 1 disorder   Cancer associated pain   Hypokalemia   Chronic pain   Lung cancer, primary, with metastasis from lung to other site   Thrombocytopenia   Stroke   Antineoplastic chemotherapy induced pancytopenia   Increased oropharyngeal secretions  [name] [name] is a 64 y.o. 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  Acute stroke - left MCA territory, left VA occlusion: likely 2/2 to hypercoagulable state 2/2 to her malignancy  -neurology following, appreciate their recommendations -continue Keppra '500mg'$  bid -LDL 50. Will not do statin. Hgb a1c 6.4.  -PT/OT recommended SNF. -holding aspirin with pancytopenia -had GOC discussion with daughter on Friday. Has not made up her mind yet. Will get in touch with her again. Also consulted palliative care for symptom mamagement.  They agree with scopalamine patch for short term to manage secretions, not recommended for long term.  Watch for anticholinergic side effects.  Swallow dysfunction - 2/2 to stroke. NPO now per SLP recs as of 9/10 -continue to work with SLP -may need NG tube >> PEG tube.  This will be determined based on family wishes.   Urinary tract infection: UA with bacteria, leuks, positive nitrites. -ceftriaxone 1gm q24h (9/7 >> 9/11). -will d/c after tonight dose  Pancytopenia: suspect is due to underlying malignancy -platelet trend 24 >> 23 >> 15 -continue to monitor, no need to transfuse at this time.  No signs of bleeding -appreciate Dr. Ernestina Penna recommendations.   Metastatic lung cancer -not candidate for chemotherapy at this time per Dr. Burr Medico -Patient previously on hospice but daughter now wishes her to be Full Code. Continue discussing [place].  Currently, daughter wishes to see how her  mother will do with physical therapy efforts and wants any and all life maintaining/life prolonging therapies to be applied to her mothers care.  She does not want hospice or palliative services at this time.  Wishes for her mother to remain Full Code.  Appreciate palliative cares time seeing the patient and her family. -PT is recommending SNF for patient.    Pain control: -dilaudid '1mg'$  q2h prn  Hypokalemia:  -repleting as necessary  Dispo: Disposition is deferred at this time, awaiting improvement of current medical problems.    The patient does have a current PCP Orpah Greek Carmelia Roller, MD) and does not need an [facility] hospital follow-up appointment after discharge.  The patient does have transportation limitations that hinder transportation to clinic appointments.    LOS: 4 days  Jule Ser, DO 08/01/2015, 10:21 AM

## 2015-08-01 NOTE — Progress Notes (Signed)
Pt was transferred to 2C03. Pt stable. VS- 97.7, 159/89, 145, 22, O2 sat 99%. Report given to RN prior to transfer made aware that pt's labs just drawn and platelets are available for pick up. Family at bedside.

## 2015-08-01 NOTE — Progress Notes (Signed)
Spoke with Freda Munro, patient's daughter, regarding patient's medical status.  She is aware that patient is bleeding from mouth and nose and informed her that she is still bleeding in this area.  She verbalized understanding.

## 2015-08-01 NOTE — Significant Event (Addendum)
Rapid Response Event Note  Overview: Time Called: 1350 Arrival Time: 1353 Event Type: Other (Comment)  Initial Focused Assessment:  Called by RN to evaluate patient with bleeding from her mouth and nose.  Upon my arrival to patients room, RN at bedside.  Patient lying in bed restless with a lot of blood in her mouth and gurgling respirations. Respirations 22 slightly labored, 100% on room air BP 167/103, HR 136. Patient nods yes to pain and SOB.     Interventions:  Breath Sounds crackles and rhonchi through out lungs.  Placed patient on 2 lpm nasal cannula with humidification for comfort.  Concern for airway clearance with amount of blood in mouth given issues with swallowing and managing secretions prior to the bleeding. [name] still a full code currently.  Recommend calling MD to reevaluate for the need of higher level of care.  MD paged and at bedside-agreed with findings, patient to transfer to SDU   Event Summary RN to call if assistance needed   at      at          [facility], Harlin Rain

## 2015-08-01 NOTE — Progress Notes (Signed)
Dr. Juleen China was paged and notified of patient's current status.  No family at bedside at this time.

## 2015-08-01 NOTE — Progress Notes (Signed)
Platelet started as ordered.  Patient's bilateral lung sound is coarse all-over.  She is pale and her mouth and nose has bloody discharge.  Dr. Juleen China on call was paged to inform MD about patient's current status.  Awaiting for MD to call me back.

## 2015-08-01 NOTE — Progress Notes (Signed)
eLink Physician-Brief Progress Note Patient Name: [name] [name] DOB: [birth year] MRN: [id number]   Date of Service  08/01/2015  HPI/Events of Note  Consulted from teaching service for worsening dyspnea in setting of epistaxis Advanced lung cancer, was on hospice Thrombocytopenic Hypertensive  eICU Interventions  rec BP control (labetalol and hydralazine written), treat thrombocytopenia, discuss packing with ENT Discuss goals of care CXR port now ABG now Asked nurse to elevate head of bed Will have fellow evaluate     Intervention Category Intermediate Interventions: Thrombocytopenia - evaluation and management;Respiratory distress - evaluation and management  MCQUAID, DOUGLAS 08/01/2015, 8:30 PM

## 2015-08-02 ENCOUNTER — Inpatient Hospital Stay (HOSPITAL_COMMUNITY): Payer: Medicare HMO

## 2015-08-02 DIAGNOSIS — R040 Epistaxis: Secondary | ICD-10-CM

## 2015-08-02 DIAGNOSIS — E440 Moderate protein-calorie malnutrition: Secondary | ICD-10-CM | POA: Insufficient documentation

## 2015-08-02 DIAGNOSIS — J9601 Acute respiratory failure with hypoxia: Secondary | ICD-10-CM | POA: Insufficient documentation

## 2015-08-02 LAB — PREPARE PLATELET PHERESIS
UNIT DIVISION: 0
UNIT DIVISION: 0
Unit division: 0
Unit division: 0
Unit division: 0
Unit division: 0

## 2015-08-02 LAB — CBC
HCT: 25.1 % — ABNORMAL LOW (ref 36.0–46.0)
HEMATOCRIT: 23.6 % — AB (ref 36.0–46.0)
HEMOGLOBIN: 7.7 g/dL — AB (ref 12.0–15.0)
HEMOGLOBIN: 7.9 g/dL — AB (ref 12.0–15.0)
MCH: 28.8 pg (ref 26.0–34.0)
MCH: 28.1 pg (ref 26.0–34.0)
MCHC: 32.6 g/dL (ref 30.0–36.0)
MCHC: 31.5 g/dL (ref 30.0–36.0)
MCV: 88.4 fL (ref 78.0–100.0)
MCV: 89.3 fL (ref 78.0–100.0)
PLATELETS: 57 10*3/uL — AB (ref 150–400)
Platelets: 19 10*3/uL — CL (ref 150–400)
RBC: 2.67 MIL/uL — AB (ref 3.87–5.11)
RBC: 2.81 MIL/uL — AB (ref 3.87–5.11)
RDW: 20.7 % — ABNORMAL HIGH (ref 11.5–15.5)
RDW: 20.3 % — ABNORMAL HIGH (ref 11.5–15.5)
WBC: 3.6 10*3/uL — AB (ref 4.0–10.5)
WBC: 3.7 10*3/uL — AB (ref 4.0–10.5)

## 2015-08-02 LAB — BASIC METABOLIC PANEL
ANION GAP: 8 (ref 5–15)
BUN: 21 mg/dL — ABNORMAL HIGH (ref 6–20)
CHLORIDE: 110 mmol/L (ref 101–111)
CO2: 25 mmol/L (ref 22–32)
Calcium: 8.8 mg/dL — ABNORMAL LOW (ref 8.9–10.3)
Creatinine, Ser: 0.42 mg/dL — ABNORMAL LOW (ref 0.44–1.00)
GFR calc Af Amer: >60 mL/min (ref >60)
Glucose, Bld: 169 mg/dL — ABNORMAL HIGH (ref 65–99)
POTASSIUM: 2.9 mmol/L — AB (ref 3.5–5.1)
SODIUM: 143 mmol/L (ref 135–145)

## 2015-08-02 LAB — MRSA PCR SCREENING: MRSA BY PCR: NEGATIVE

## 2015-08-02 MED ORDER — POTASSIUM CHLORIDE 10 MEQ/100ML IV SOLN
10.0000 meq | INTRAVENOUS | Status: AC
  Administered 2015-08-02 (×4): 10 meq via INTRAVENOUS
  Filled 2015-08-02 (×4): qty 100

## 2015-08-02 MED ORDER — FLEET ENEMA 7-19 GM/118ML RE ENEM
1.0000 | ENEMA | Freq: Once | RECTAL | Status: AC
  Administered 2015-08-02: 1 via RECTAL
  Filled 2015-08-02: qty 1

## 2015-08-02 NOTE — Consult Note (Signed)
PULMONARY  / [place]   Name: [name] [name] MRN: [id number] DOB: October 06, 1951    ADMISSION DATE:  08/08/2015 CONSULTATION DATE: 08/02/2015  REQUESTING CLINICIAN: Dr. Dareen Piano PRIMARY SERVICE: IMTS  CHIEF COMPLAINT:  Epistaxis  BRIEF PATIENT DESCRIPTION: 89 F with advanced lung Ca, HCV previously on hospice readmitted to [facility] on 9/7 for new stroke. On 9/11 had episode of spontaneous epistaxis and gum bleeding. PCCM consulted for intubation.  SIGNIFICANT EVENTS / STUDIES:  1. Large MCA stroke detected 9/7  LINES / TUBES: PIV  HISTORY OF PRESENT ILLNESS:  [name] [name] is a 68F with an extensive PMH including advanced lung cancer and HCV who presented to [facility] for AMS in the setting of a new MCA stroke. On the evening of 9/11 she developed spontaneous epistaxis and gum bleeding and was transferred to the SDU. PCCM was consulted for intubation. The primary team contacted ENT and the for assistance managing the epistaxis and it resolved with minimal medical intervention and PLT transfusion. The patient is current alert but confused. Her RN notes that her breathing is better than before but she can hear secretions rattling.   PAST MEDICAL HISTORY :  Past Medical History  Diagnosis Date  . Hypertension   . Bipolar 1 disorder   . Heart murmur   . Sciatica   . Arthritis   . Anxiety   . Diverticulosis   . Chronic low back pain   . DDD (degenerative disc disease), lumbar   . Chronic shoulder pain   . Chronic neck pain   . Hepatitis B     Unclear when initially diagnosed, labs in Epic from 03/09/13  . Hepatitis C     Unclear when initially diagnosed, labs in Epic from 03/09/13  . C. difficile diarrhea     April and February 2014  . COPD (chronic obstructive pulmonary disease)   . GERD (gastroesophageal reflux disease)   . H/O hiatal hernia   . Headache(784.0)     hx  . PONV (postoperative nausea and vomiting)   . Seizures     on meds  . Diabetes mellitus   .  Adenocarcinoma of lung, stage 1   . GERD (gastroesophageal reflux disease)    Past Surgical History  Procedure Laterality Date  . Facial tumor removal Right 2000  . Brain tumor removal  09  . Brain surgery      in [place]  . Video bronchoscopy  12/04/2012    Procedure: VIDEO BRONCHOSCOPY;  Surgeon: Grace Isaac, MD;  Location: [facility];  Service: Thoracic;  Laterality: N/A;  . Video assisted thoracoscopy (vats)/wedge resection  12/04/2012    Procedure: VIDEO ASSISTED THORACOSCOPY (VATS)/WEDGE RESECTION;  Surgeon: Grace Isaac, MD;  Location: [place];  Service: Thoracic;  Laterality: Right;  . Lobectomy  12/04/2012    Procedure: LOBECTOMY;  Surgeon: Grace Isaac, MD;  Location: [place];  Service: Thoracic;  Laterality: Right;  completion of right upper lobectomy and lymph node disection, placement of on q pump  . Tubal ligation    . Tonsillectomy    . Anterior cervical decomp/discectomy fusion N/A 03/05/2014    Procedure: ANTERIOR CERVICAL DECOMPRESSION/DISCECTOMY FUSION 2 LEVELS;  Surgeon: Sinclair Ship, MD;  Location: [place];  Service: Orthopedics;  Laterality: N/A;  Anterior cervical decompression fusion, cervical 4-5, cervical 5-6 with instrumentation, allograft.   Prior to Admission medications   Medication Sig Start Date End Date Taking? Authorizing Provider  ALPRAZolam Duanne Moron) 0.25 MG tablet Take 1  tablet (0.25 mg total) by mouth at bedtime as needed for anxiety. 07/07/15  Yes Velvet Bathe, MD  magnesium hydroxide (MILK OF MAGNESIA) 800 MG/5ML suspension Take 30 mLs by mouth daily as needed for constipation.   Yes Historical Provider, MD  Menthol-Methyl Salicylate (MUSCLE RUB) 10-15 % CREA Apply 1 application topically daily as needed for muscle pain.   Yes Historical Provider, MD  OxyCODONE (OXYCONTIN) 20 mg T12A 12 hr tablet Take 2 tablets (40 mg total) by mouth every 8 (eight) hours. 07/12/15  Yes Truitt Merle, MD  Oxycodone HCl 10 MG TABS Take 1 tablet (10 mg total) by  mouth every 4 (four) hours as needed (moderate pain, severe pain, breakthrough pain). 07/12/15  Yes Truitt Merle, MD  Sennosides-Docusate Sodium (SENNA S PO) Take 2 tablets by mouth 2 (two) times daily. 06/21/15  Yes Historical Provider, MD  DULoxetine (CYMBALTA) 30 MG capsule Take 1 capsule (30 mg total) by mouth daily. Patient not taking: Reported on 07/24/2015 05/06/15   Modena Jansky, MD  feeding supplement (BOOST / RESOURCE BREEZE) LIQD Take 1 Container by mouth 3 (three) times daily between meals. Patient not taking: Reported on 07/26/2015 07/07/15   Velvet Bathe, MD  metoCLOPramide (REGLAN) 5 MG tablet Take 1 tablet (5 mg total) by mouth 3 (three) times daily before meals. Patient not taking: Reported on 08/11/2015 03/12/15   Oswald Hillock, MD  omeprazole (PRILOSEC) 20 MG capsule Take 1 capsule (20 mg total) by mouth 2 (two) times daily before a meal. Patient not taking: Reported on 07/29/2015 03/26/15   Tyler Pita, MD  ondansetron (ZOFRAN-ODT) 8 MG disintegrating tablet Take 1 tablet (8 mg total) by mouth every 6 (six) hours as needed for nausea or vomiting. Patient not taking: Reported on 07/31/2015 04/28/15   Truitt Merle, MD   Allergies  Allergen Reactions  . Haldol [Haloperidol Decanoate] Swelling    tongue swelling  . Acetaminophen Other (See Comments)    Stomach pain  . Ibuprofen Other (See Comments)    Stomach pain  . Metronidazole Other (See Comments)    Severe stomach upset--was instructed to avoid Flagyl.  . Morphine And Related Hives    Tolerates hydromorphone.  Marland Kitchen Penicillins Rash    FAMILY HISTORY:  Family History  Problem Relation Age of Onset  . Hypertension Father     deceased  . Diabetes Father   . Heart disease Father   . Hyperlipidemia Father   . Hypertension Mother   . Hyperlipidemia Mother   . Diabetes Mother   . Hypertension Sister   . Hyperlipidemia Sister   . Hypertension Brother   . Hyperlipidemia Brother    SOCIAL HISTORY:  reports that she quit smoking about  2 years ago. Her smoking use included Cigarettes. She has a 2.5 pack-year smoking history. She has never used smokeless tobacco. She reports that she uses illicit drugs (Hydrocodone and Marijuana). She reports that she does not drink alcohol.  REVIEW OF SYSTEMS:  Unable to obtain secondary to patient condition.  SUBJECTIVE:   VITAL SIGNS: Temp:  [97.7 F (36.5 C)-98.5 F (36.9 C)] 97.8 F (36.6 C) (09/12 0000) Pulse Rate:  [106-154] 110 (09/12 0000) Resp:  [20-35] 30 (09/12 0000) BP: (108-179)/(80-114) 156/95 mmHg (09/12 0000) SpO2:  [96 %-100 %] 98 % (09/12 0000) Weight:  [110 lb 7.2 oz (50.1 kg)] 110 lb 7.2 oz (50.1 kg) (09/11 1730) HEMODYNAMICS:   VENTILATOR SETTINGS:   INTAKE / OUTPUT: Intake/Output      09/11 0701 -  09/12 0700   I.V. (mL/kg) 216.3 (4.3)   Blood 765   Total Intake(mL/kg) 981.3 (19.6)   Net +981.3         PHYSICAL EXAMINATION: General:  Elderly F in NAD Neuro:  A+O x 1 (place).  HEENT:  Sclera anicteric, conjunctiva pink, old blood lining lips and nares Neck: Trachea supple and midline, (-) LAN Cardiovascular:  RRR, NS1/S2, (-) MRG Lungs:  Rales  Abdomen:  S/NT/ND/(+)BS Musculoskeletal:  (-) C/C/E Skin:  Intact  LABS:  CBC  Recent Labs Lab 07/29/15 0640 07/30/15 0430 08/01/15 0450  WBC 4.8 5.3 4.7  HGB 9.0* 9.5* 9.0*  HCT 28.1* 29.5* 28.1*  PLT 24* 23* 15*   Coag's  Recent Labs Lab 07/27/2015 1642 08/01/15 1610  APTT 27  --   INR 1.39 1.63*   BMET  Recent Labs Lab 07/29/15 0640 07/30/15 0430 08/01/15 0450  NA 136 139 144  K 3.7 3.3* 3.9  CL 99* 103 112*  CO2 26 24 18*  BUN 21* 18 24*  CREATININE 0.63 0.59 0.72  GLUCOSE 95 96 99   Electrolytes  Recent Labs Lab 07/29/15 0640 07/29/15 0710 07/30/15 0430 08/01/15 0450  CALCIUM 9.1  --  9.1 9.5  MG  --  2.1  --   --    Sepsis Markers No results for input(s): LATICACIDVEN, PROCALCITON, O2SATVEN in the last 168 hours. ABG  Recent Labs Lab 08/01/15 2029  PHART  7.446  PCO2ART 31.9*  PO2ART 66.3*   Liver Enzymes  Recent Labs Lab 08/20/2015 1642  AST 32  ALT 13*  ALKPHOS 321*  BILITOT 0.9  ALBUMIN 2.8*   Cardiac Enzymes No results for input(s): TROPONINI, PROBNP in the last 168 hours. Glucose  Recent Labs Lab 08/12/2015 1631 07/29/15 0651 07/29/15 1128 07/31/15 1101 08/01/15 0757  GLUCAP 130* 97 84 93 107*    Imaging Dg Chest Port 1 View  08/01/2015   CLINICAL DATA:  Acute respiratory failure with hypoxemia.  EXAM: PORTABLE CHEST - 1 VIEW  COMPARISON:  Chest radiograph 07/04/2015.  PET-CT 04/01/2015  FINDINGS: Tip of the right chest port in the mid SVC. Cardiomediastinal contours are unchanged. Chain sutures at the right hilum. There is increased retrocardiac opacity at the medial left base. Question of small bilateral pleural effusions. Increased interstitial thickening from prior exam, most prominent in the perihilar regions. No pneumothorax. Known bony metastatic disease is not well seen radiographically.  IMPRESSION: 1. Increased interstitial thickening from prior exam, may reflect pulmonary edema. 2. Probable small bilateral pleural effusions. Retrocardiac opacity may be related to pleural effusion, atelectasis, or pneumonia. Continued radiographic follow-up is recommended.   Electronically Signed   By: Jeb Levering M.D.   On: 08/01/2015 21:20    ASSESSMENT / PLAN:  1. Epistaxis: At this point, there is no urgent indication for intubation. [name] [name] is protecting her airway. She is resisting the nursing staff's attempts at suction and facial care which is concerning. Ideally, would suggest aggressive pulmonary toilet but doubt patient will comply.  2. GOC: I spoke to [name] [name]'s daughter at length. She understands the patient's situation but for now want her to have "everything" done. She is reluctant to focus on comfort.   TODAY'S SUMMARY:   I have personally obtained a history, examined the patient, evaluated laboratory and  imaging results, formulated the assessment and plan and placed orders.  CRITICAL CARE: The patient is critically ill with multiple organ systems failure and requires high complexity decision making for assessment and  support, frequent evaluation and titration of therapies, application of advanced monitoring technologies and extensive interpretation of multiple databases. Critical Care Time devoted to patient care services described in this note is 45 minutes.   Margarette Asal, MD Pulmonary and [place] Pager: [phone]   08/02/2015, 12:10 AM

## 2015-08-02 NOTE — Clinical Social Work Note (Signed)
Clinical Social Work Assessment  Patient Details  Name: [name] [name] MRN: [id number] Date of Birth: 1951-02-25  Date of referral:  08/02/15               Reason for consult:  Facility Placement                Permission sought to share information with:  Facility Sport and exercise psychologist, Family Supports Permission granted to share information::   (pt disoriented)  Name::     [name] [name]  Agency::  [facility] SN[facility]  Relationship::  daughter  Contact Information:     Housing/Transportation Living arrangements for the past 2 months:  Single Family Home Source of Information:  Adult Children Patient Interpreter Needed:  None Criminal Activity/Legal Involvement Pertinent to Current Situation/Hospitalization:  No - Comment as needed Significant Relationships:  Adult Children Lives with:    Do you feel safe going back to the place where you live?  Yes Need for family participation in patient care:  Yes (Comment)  Care giving concerns:  CSW is requiring more assistance than is available at home- daughter can not handle pt current level of needs   Facilities manager / plan:  CSW spoke with pt daughter concerning PT recommendation for SNF  Employment status:    Insurance information:  Programmer, applications, Medicaid In [place] PT Recommendations:  [place] / Referral to community resources:  [place]  Patient/Family's Response to care:  Pt daughter is agreeable to SNF- would like for pt to get rehab to increase independence so pt can return home and potentially transfer back to hospice services if appropriate- states that she would like [employer] if possible  Patient/Family's Understanding of and Emotional Response to Diagnosis, Current Treatment, and Prognosis:  Per medical notes pt daughter does not have clear understanding of pt prognosis and would like continued medical interventions despite poor prognosis.  Emotional  Assessment Appearance:    Attitude/Demeanor/Rapport:  Unable to Assess Affect (typically observed):  Unable to Assess Orientation:  Oriented to Self, Oriented to Place Alcohol / Substance use:  Not Applicable Psych involvement (Current and /or in the community):  No (Comment)  Discharge Needs  Concerns to be addressed:  Home Safety Concerns, Care Coordination Readmission within the last 30 days:  Yes Current discharge risk:  Physical Impairment, Terminally ill, Cognitively Impaired Barriers to Discharge:  Continued Medical Work up   [employer], LCSW 08/02/2015, 4:30 PM

## 2015-08-02 NOTE — Progress Notes (Signed)
Subjective: Patient seen and examined at bedside today.  No family is present.  Pain appears better than previous days but patient still appears to be hurting.  Bleeding from nose and mouth better.     Objective: Vital signs in last 24 hours: Filed Vitals:   08/02/15 0808 08/02/15 1127 08/02/15 1441 08/02/15 1600  BP: 158/87 145/95 167/100 142/89  Pulse:  129  131  Temp: 98 F (36.7 C) 97.3 F (36.3 C)  97.3 F (36.3 C)  TempSrc: Oral Oral  Oral  Resp:  28    Height:      Weight:      SpO2:    99%   Weight change:   Intake/Output Summary (Last 24 hours) at 08/02/15 1733 Last data filed at 08/02/15 1718  Gross per 24 hour  Intake 4178.5 ml  Output      0 ml  Net 4178.5 ml   General: lying in bed, awake, able to follow commands, answer questions, secretions improved today HEENT: EOMI, no scleral icterus Cardiac: regular rhythm, rate is still tachycardic, no rubs, murmurs or gallops Pulm: has some coarse breath sounds diffusely.  Abd: soft, appears to be tender to palpation based on response.  Tenderness is not specific to one particular area of the abdomen.  She is nondistended, BS present Neuro: alert, cranial nerves II-XII grossly intact  Lab Results: Basic Metabolic Panel:  Recent Labs Lab 07/29/15 0710  08/01/15 0450 08/02/15 0440  NA  --   < > 144 143  K  --   < > 3.9 2.9*  CL  --   < > 112* 110  CO2  --   < > 18* 25  GLUCOSE  --   < > 99 169*  BUN  --   < > 24* 21*  CREATININE  --   < > 0.72 0.42*  CALCIUM  --   < > 9.5 8.8*  MG 2.1  --   --   --   < > = values in this interval not displayed. Liver Function Tests:  Recent Labs Lab 08/04/2015 1642  AST 32  ALT 13*  ALKPHOS 321*  BILITOT 0.9  PROT 5.4*  ALBUMIN 2.8*   CBC:  Recent Labs Lab 08/14/2015 1642  08/01/15 2153 08/02/15 0440  WBC 6.1  < > 3.6* 3.7*  NEUTROABS 4.7  --   --   --   HGB 9.9*  < > 7.7* 7.9*  HCT 30.8*  < > 23.6* 25.1*  MCV 89.0  < > 88.4 89.3  PLT 30*  < > 19* 57*  <  > = values in this interval not displayed. CBG:  Recent Labs Lab 07/29/2015 1631 07/29/15 0651 07/29/15 1128 07/31/15 1101 08/01/15 0757  GLUCAP 130* 97 84 93 107*   Fasting Lipid Panel:  Recent Labs Lab 07/29/15 0640  CHOL 119  HDL 43  LDLCALC 50  TRIG 130  CHOLHDL 2.8   Coagulation:  Recent Labs Lab 07/31/2015 1642 08/01/15 1610  LABPROT 17.1* 19.4*  INR 1.39 1.63*   Anemia Panel: No results for input(s): VITAMINB12, FOLATE, FERRITIN, TIBC, IRON, RETICCTPCT in the last 168 hours. Urine Drug Screen: Drugs of Abuse     Component Value Date/Time   LABOPIA POSITIVE* 08/19/2015 1852   COCAINSCRNUR NONE DETECTED 08/08/2015 1852   LABBENZ POSITIVE* 08/20/2015 1852   AMPHETMU NONE DETECTED 08/14/2015 1852   THCU POSITIVE* 08/04/2015 1852   LABBARB NONE DETECTED 07/22/2015 1852    Alcohol Level:  Recent Labs Lab 08/16/2015 1642  ETH <5   Urinalysis:  Recent Labs Lab 07/24/2015 1852  COLORURINE AMBER*  LABSPEC 1.016  PHURINE 8.5*  GLUCOSEU NEGATIVE  HGBUR NEGATIVE  BILIRUBINUR NEGATIVE  KETONESUR 15*  PROTEINUR 30*  UROBILINOGEN 1.0  NITRITE POSITIVE*  LEUKOCYTESUR LARGE*    Micro Results: No results found for this or any previous visit (from the past 240 hour(s)). Studies/Results: Dg Chest 1 View  08/02/2015   CLINICAL DATA:  Shortness of Breath  EXAM: CHEST  1 VIEW  COMPARISON:  08/01/2015  FINDINGS: Right Port-A-Cath remains in place, unchanged. Worsening bilateral airspace opacities. Heart is normal size. No visible effusions.  IMPRESSION: Worsening bilateral airspace opacities could reflect edema or infection.   Electronically Signed   By: Rolm Baptise M.D.   On: 08/02/2015 10:00   Dg Chest Port 1 View  08/01/2015   CLINICAL DATA:  Acute respiratory failure with hypoxemia.  EXAM: PORTABLE CHEST - 1 VIEW  COMPARISON:  Chest radiograph 07/04/2015.  PET-CT 04/01/2015  FINDINGS: Tip of the right chest port in the mid SVC. Cardiomediastinal contours are  unchanged. Chain sutures at the right hilum. There is increased retrocardiac opacity at the medial left base. Question of small bilateral pleural effusions. Increased interstitial thickening from prior exam, most prominent in the perihilar regions. No pneumothorax. Known bony metastatic disease is not well seen radiographically.  IMPRESSION: 1. Increased interstitial thickening from prior exam, may reflect pulmonary edema. 2. Probable small bilateral pleural effusions. Retrocardiac opacity may be related to pleural effusion, atelectasis, or pneumonia. Continued radiographic follow-up is recommended.   Electronically Signed   By: Jeb Levering M.D.   On: 08/01/2015 21:20   Dg Abd 2 Views  08/02/2015   CLINICAL DATA:  Epistaxis and rales  EXAM: ABDOMEN - 2 VIEW  COMPARISON:  July 04, 2015  FINDINGS: Supine and left lateral decubitus images obtained. There is a paucity of gas. There is diffuse stool throughout the colon. No free air is apparent. There is postoperative change in lumbosacral region on the left.  There is a linear radiopaque foreign body either in or overlying the left lower pelvis.  IMPRESSION: Paucity of gas. While this finding may be seen normally, it raises question of underlying ileus or enteritis. Obstruction felt to be less likely. Diffuse stool throughout colon. Note that the rectum is distended with stool.  There is a linear radiopaque foreign body either in or overlying the lower left pelvis of uncertain etiology. This area is only imaged well on the frontal view, and dislocation cannot be confirmed currently in two views.   Electronically Signed   By: Lowella Grip III M.D.   On: 08/02/2015 10:01   Medications:  Scheduled Meds: . sodium chloride   Intravenous Once  . cefTRIAXone (ROCEPHIN)  IV  1 g Intravenous Q24H  . levETIRAcetam  500 mg Intravenous Q12H  . oxymetazoline  1 spray Each Nare BID  . scopolamine  1 patch Transdermal Q72H  . sodium chloride  3 mL Intravenous  Q12H   Continuous Infusions: . dextrose 5 % and 0.45% NaCl 1,000 mL (08/02/15 0653)   PRN Meds:.bisacodyl, hydrALAZINE, HYDROmorphone (DILAUDID) injection, labetalol, levalbuterol, MUSCLE RUB, ondansetron **OR** ondansetron (ZOFRAN) IV, phenol, sodium chloride, sodium chloride Assessment/Plan: Principal Problem:   CVA (cerebral infarction) Active Problems:   Bipolar 1 disorder   Cancer associated pain   Hypokalemia   Chronic pain   Lung cancer, primary, with metastasis from lung to other site   Thrombocytopenia  Stroke   Antineoplastic chemotherapy induced pancytopenia   Increased oropharyngeal secretions   Acute respiratory failure with hypoxemia   Epistaxis   Moderate malnutrition  [name] [name] is a 64 y.o. 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  Acute stroke - left MCA territory, left VA occlusion: likely 2/2 to hypercoagulable state 2/2 to her malignancy  -neurology following, appreciate their recommendations -continue Keppra '500mg'$  bid -LDL 50. Will not do statin. Hgb a1c 6.4.  -PT/OT recommended SNF. -holding aspirin with pancytopenia -had GOC discussion with daughter on Friday and over the weekend.  No family present today on rounds. Has not made up her mind yet. Will continue to try to work with them. Also consulted palliative care for symptom mamagement.  They agree with scopalamine patch for short term to manage secretions, not recommended for long term.  Watch for anticholinergic side effects.  Swallow dysfunction - 2/2 to stroke. NPO now per SLP recs as of 9/12 -continue to work with SLP -may need NG tube >> PEG tube.  Need to discuss with patient and family how they wish to proceed.   -IR willing to proceed with gastrostomy but will need bowel prep, possible platelet transfusion prior to surgery.    Urinary tract infection: UA  with bacteria, leuks, positive nitrites. -d/c ceftriaxone 1gm q24h (9/7 >> 9/11).  Pancytopenia: suspect is due to underlying malignancy -platelet trend 24 >> 23 >> 15>>57 (s/p 3 units of platelets) -epistaxis has stopped, not currently bleeding -appreciate Dr. Ernestina Penna recommendations.   Metastatic lung cancer -not candidate for chemotherapy at this time per Dr. Burr Medico -Patient previously on hospice but daughter now wishes her to be Full Code. Continue discussing [place].  Currently, daughter wishes to see how her mother will do with physical therapy efforts and wants any and all life maintaining/life prolonging therapies to be applied to her mothers care.  She does not want hospice or palliative services at this time.  Wishes for her mother to remain Full Code.  Appreciate palliative cares time seeing the patient and her family. -PT is recommending SNF for patient.    Pain control: -dilaudid '1mg'$  q2h prn  Hypokalemia:  -repleting as necessary  Hypertension -Hydralazine prn  -Labetalol prn  Epistaxis -currently resolved 2/2 low platelets -Afrin bid -NaCl nasal spray prn  Dispo: Disposition is deferred at this time, awaiting improvement of current medical problems.    The patient does have a current PCP Orpah Greek Carmelia Roller, MD) and does not need an [facility] hospital follow-up appointment after discharge.  The patient does have transportation limitations that hinder transportation to clinic appointments.    LOS: 5 days   Jule Ser, DO 08/02/2015, 5:33 PM

## 2015-08-02 NOTE — Progress Notes (Signed)
PULMONARY  / [place]   Name: [name] [name] MRN: [id number] DOB: 08/31/1951    ADMISSION DATE:  08/09/2015 CONSULTATION DATE: 08/02/2015  REQUESTING CLINICIAN: Dr. Dareen Piano PRIMARY SERVICE: IMTS  CHIEF COMPLAINT:  Epistaxis  BRIEF PATIENT DESCRIPTION: 15 F with advanced metastatic lung Ca (no longer candidate for chemo), Hep C, L frontal brain tumor s/p resection previously on hospice readmitted to [facility] on 9/7 for new stroke. On 9/11 had episode of spontaneous epistaxis and gum bleeding. PCCM consulted 9/11.   SIGNIFICANT EVENTS / STUDIES:  1. Large MCA stroke detected 9/7  LINES / TUBES: PIV  SUBJECTIVE: Epistaxis resolved after plts.  Remains dyspneic.  VITAL SIGNS: Temp:  [97.4 F (36.3 C)-98.5 F (36.9 C)] 98 F (36.7 C) (09/12 0808) Pulse Rate:  [68-154] 125 (09/12 0521) Resp:  [20-35] 29 (09/12 0521) BP: (108-179)/(72-114) 158/87 mmHg (09/12 0808) SpO2:  [96 %-100 %] 100 % (09/12 0521) Weight:  [110 lb 7.2 oz (50.1 kg)] 110 lb 7.2 oz (50.1 kg) (09/11 1730)    INTAKE / OUTPUT: Intake/Output      09/11 0701 - 09/12 0700 09/12 0701 - 09/13 0700   I.V. (mL/kg) 1022.5 (20.4)    Blood 915    IV Piggyback 1095    Total Intake(mL/kg) 3032.5 (60.5)    Net +3032.5          Urine Occurrence 1 x      PHYSICAL EXAMINATION: General:  Elderly F in NAD Neuro: awake, O x 2 (self and place).  HEENT:  Sclera anicteric, conjunctiva pink, old blood lining lips and nares Neck: Trachea supple and midline, (-) LAN Cardiovascular:  RRR, tachy Lungs:  resps even, mildly labored on RA, bilat coarse  Abdomen:  S/NT/ND/(+)BS Musculoskeletal:  (-) C/C/E Skin:  Intact  LABS:  CBC  Recent Labs Lab 08/01/15 0450 08/01/15 2153 08/02/15 0440  WBC 4.7 3.6* 3.7*  HGB 9.0* 7.7* 7.9*  HCT 28.1* 23.6* 25.1*  PLT 15* 19* 57*   Coag's  Recent Labs Lab 08/17/2015 1642 08/01/15 1610  APTT 27  --   INR 1.39 1.63*   BMET  Recent Labs Lab  07/30/15 0430 08/01/15 0450 08/02/15 0440  NA 139 144 143  K 3.3* 3.9 2.9*  CL 103 112* 110  CO2 24 18* 25  BUN 18 24* 21*  CREATININE 0.59 0.72 0.42*  GLUCOSE 96 99 169*   Electrolytes  Recent Labs Lab 07/29/15 0710 07/30/15 0430 08/01/15 0450 08/02/15 0440  CALCIUM  --  9.1 9.5 8.8*  MG 2.1  --   --   --    Sepsis Markers No results for input(s): LATICACIDVEN, PROCALCITON, O2SATVEN in the last 168 hours. ABG  Recent Labs Lab 08/01/15 2029  PHART 7.446  PCO2ART 31.9*  PO2ART 66.3*   Liver Enzymes  Recent Labs Lab 07/27/2015 1642  AST 32  ALT 13*  ALKPHOS 321*  BILITOT 0.9  ALBUMIN 2.8*   Cardiac Enzymes No results for input(s): TROPONINI, PROBNP in the last 168 hours. Glucose  Recent Labs Lab 07/30/2015 1631 07/29/15 0651 07/29/15 1128 07/31/15 1101 08/01/15 0757  GLUCAP 130* 97 84 93 107*    Imaging Dg Chest Port 1 View  08/01/2015   CLINICAL DATA:  Acute respiratory failure with hypoxemia.  EXAM: PORTABLE CHEST - 1 VIEW  COMPARISON:  Chest radiograph 07/04/2015.  PET-CT 04/01/2015  FINDINGS: Tip of the right chest port in the mid SVC. Cardiomediastinal contours are unchanged. Chain sutures at the right hilum. There is  increased retrocardiac opacity at the medial left base. Question of small bilateral pleural effusions. Increased interstitial thickening from prior exam, most prominent in the perihilar regions. No pneumothorax. Known bony metastatic disease is not well seen radiographically.  IMPRESSION: 1. Increased interstitial thickening from prior exam, may reflect pulmonary edema. 2. Probable small bilateral pleural effusions. Retrocardiac opacity may be related to pleural effusion, atelectasis, or pneumonia. Continued radiographic follow-up is recommended.   Electronically Signed   By: Jeb Levering M.D.   On: 08/01/2015 21:20    ASSESSMENT / PLAN:  Epistaxis - resolved for now, due to thrombocytopenia (which will recur).   Advanced lung ca  with metastatic disease.  Failure to thrive due to the above Pancytopenia  Dyspnea - suspect edema v possible aspiration of epistaxis.    PLAN -  F/u cxr now to assess degree of interstitial disease, guide possible diuresis F/u cbc  Consider transfusion if hgb cont to trend down, may be a contributor to her decline Supplemental O2 as needed  Continue empiric abx    Pt is clearly deteriorating overall, previously on hospice with advanced lung ca with lumbar metastasis.  Worsening pancytopenia and not candidate for chemo.  Currently protecting her airway but remains dyspneic.  MUST reinitiate discussions with patient and her daughter regarding overall plan for care. She is a POOR candidate for aggressive interventions. She would not survive CPR or mechanical ventilation.  Recommend that focus should be comfort.      Nickolas Madrid, NP 08/02/2015  8:58 AM Pager: [phone] or [phone]   Attending Note:  I have examined patient, reviewed labs, studies and notes. I have discussed the case with Shon Millet, and I agree with the data and plans as I have amended above.  Her epistaxis has stopped with platelets. She has interstitial infiltrates on CXR, ? Evidence for blood aspiration, pulm hemorrhage, pulm edema. Overall she has stabilized but the trend will be one of decline. She needs Palliative Care involvement, clarification of what therapies are possible, what services are possible especially hospice. We will check a CXR, continue abx, follow with you.    Baltazar Apo, MD, PhD 08/02/2015, 9:30 AM [facility] [phone] or if no answer [phone]

## 2015-08-02 NOTE — Significant Event (Signed)
CRITICAL VALUE ALERT  Critical value received:  Potassium 2.9, Platelets 57  Date of notification:  08/02/2015  Time of notification: 3462  Critical value read back: yes  Nurse who received alert:  Raynelle Highland, RN  MD notified (1st page): IMTS on-call   Time of first page:  [phone]  Responding MD: Dr. Marvel Plan  Time MD responded: [phone]

## 2015-08-02 NOTE — Progress Notes (Signed)
PT Cancellation Note  Patient Details Name: [name] [name] MRN: [id number] DOB: Jul 10, 1951   Cancelled Treatment:    Reason Eval/Treat Not Completed: Medical issues which prohibited therapy (pt tachycardic with HR 137 in supine and not appropriate at this time)   Melford Aase 08/02/2015, 9:04 AM [place] Reach, [place], [place]

## 2015-08-02 NOTE — Care Management Important Message (Signed)
Important Message  Patient Details  Name: Shaquana Buel MRN: [id number] Date of Birth: 1951/07/13   Medicare Important Message Given:  Yes-second notification given    Nathen May 08/02/2015, 12:15 [place] Message  Patient Details  Name: Sanaai Doane MRN: [id number] Date of Birth: September 20, 1951   Medicare Important Message Given:  Yes-second notification given    Nathen May 08/02/2015, 12:15 PM

## 2015-08-02 NOTE — Progress Notes (Signed)
Labs that were drawn after the 1st units of platelets resulted, revealing a Hgb of 7.7.  By this time 2 more units of platelets had been given.  About halfway through the administration of the 2nd unit of platelets, the oral bleeding had stopped.  Breath sounds remain "wet" and is rhonchus in all fields; she is still tachypnic, but O2 sats are WNL.  IMTS on-call was notified of above and they advised to monitor pt and await results from ordered morning labs.  Will continue to monitor.

## 2015-08-02 NOTE — Progress Notes (Signed)
Upon initial assessment pt is in bed.  She is tachycardic, tachypnic and hypertensive. There is blood in her mouth, on the pillow next to her face and on several washcloths in the bed.  She is alert, mostly cooperative and can follow commands, but does not always comply.  RN attempted to do oral suction, but the pt is refusing.  Breath sounds are wet, pt is gurgling and blood pooling in her mouth and is dripping off of her chin.  Encouraged pt to cough to clear airway and swallow. She did eventually try, but her cough is weak.  Lung sounds are rhonchus in all fields.  RR RN came by to assess after receiving her shift report.  She recommends requesting ENT and PCCM consult.  Page was sent to on-call pager and residents on-call came by to assess pt at the bedside.  Soon after, PCCM MD assessed via [place] and gave orders for Labetolol and Hydralazine for HR and BP control. IMTS oredered 2 more units of platelets.  Interventions initiated. Will monitor for change in condition.

## 2015-08-02 NOTE — Clinical Social Work Placement (Signed)
   CLINICAL SOCIAL WORK PLACEMENT  NOTE  Date:  08/02/2015  Patient Details  Name: [name] [name] MRN: [id number] Date of Birth: June 06, 1951  Clinical Social Work is seeking post-discharge placement for this patient at the [place] level of care (*CSW will initial, date and re-position this form in  chart as items are completed):  Yes   Patient/family provided with [place] Work Department's list of facilities offering this level of care within the geographic area requested by the patient (or if unable, by the patient's family).  Yes   Patient/family informed of their freedom to choose among providers that offer the needed level of care, that participate in Medicare, Medicaid or managed care program needed by the patient, have an available bed and are willing to accept the patient.  Yes   Patient/family informed of [facility]'s ownership interest in [facility] and [facility], as well as of the fact that they are under no obligation to receive care at these facilities.  PASRR submitted to EDS on       PASRR number received on       Existing PASRR number confirmed on 08/02/15     FL2 transmitted to all facilities in geographic area requested by pt/family on 08/02/15     FL2 transmitted to all facilities within larger geographic area on       Patient informed that his/her managed care company has contracts with or will negotiate with certain facilities, including the following:            Patient/family informed of bed offers received.  Patient chooses bed at       Physician recommends and patient chooses bed at      Patient to be transferred to   on  .  Patient to be transferred to facility by       Patient family notified on   of transfer.  Name of family member notified:        PHYSICIAN Please sign FL2     Additional Comment:    _______________________________________________ Cranford Mon, LCSW 08/02/2015, 4:33  PM

## 2015-08-02 NOTE — Consult Note (Signed)
Chief Complaint: Patient was seen in consultation today for percutaneous gastric tube placement Chief Complaint  Patient presents with  . Code Stroke   at the request of Teaching Service  Referring Physician(s): Dr Orie Fisherman  History of Present Illness: [name] [name] is a 64 y.o. female   Pt with Lung Ca Brain mets Admitted with epistaxis; thrombocytopenia New CVA 08/01/2015 Comes to ED from SNF Had been with Hospice pt but transferred to SNF Family wants to remain with SNF Pt with FTT; debilitation Malnutrition ; dysphagia Need for long term care Stable from epistaxis standpoint Plt 57 today Request made for percutaneous gastric tube placement Dr Kathlene Cote has reviewed imaging  (CT from 04/2015) and approves procedure technically Has asked for 2V abd today: approves to move ahead technically  Would need: 1) bowel cleansing/prep to safely move forward secondary diffuse stool in bowel                       2): plt would need to be closer to 100                       Will recheck chart 9/13 am; if all aspects are remedied; we can potentially go ahead 9/13 afternoon.   Past Medical History  Diagnosis Date  . Hypertension   . Bipolar 1 disorder   . Heart murmur   . Sciatica   . Arthritis   . Anxiety   . Diverticulosis   . Chronic low back pain   . DDD (degenerative disc disease), lumbar   . Chronic shoulder pain   . Chronic neck pain   . Hepatitis B     Unclear when initially diagnosed, labs in Epic from 03/09/13  . Hepatitis C     Unclear when initially diagnosed, labs in Epic from 03/09/13  . C. difficile diarrhea     April and February 2014  . COPD (chronic obstructive pulmonary disease)   . GERD (gastroesophageal reflux disease)   . H/O hiatal hernia   . Headache(784.0)     hx  . PONV (postoperative nausea and vomiting)   . Seizures     on meds  . Diabetes mellitus   . Adenocarcinoma of lung, stage 1   . GERD (gastroesophageal reflux disease)      Past Surgical History  Procedure Laterality Date  . Facial tumor removal Right 2000  . Brain tumor removal  09  . Brain surgery      in [place]  . Video bronchoscopy  12/04/2012    Procedure: VIDEO BRONCHOSCOPY;  Surgeon: Grace Isaac, MD;  Location: [facility] OR;  Service: Thoracic;  Laterality: N/A;  . Video assisted thoracoscopy (vats)/wedge resection  12/04/2012    Procedure: VIDEO ASSISTED THORACOSCOPY (VATS)/WEDGE RESECTION;  Surgeon: Grace Isaac, MD;  Location: [place];  Service: Thoracic;  Laterality: Right;  . Lobectomy  12/04/2012    Procedure: LOBECTOMY;  Surgeon: Grace Isaac, MD;  Location: [place];  Service: Thoracic;  Laterality: Right;  completion of right upper lobectomy and lymph node disection, placement of on q pump  . Tubal ligation    . Tonsillectomy    . Anterior cervical decomp/discectomy fusion N/A 03/05/2014    Procedure: ANTERIOR CERVICAL DECOMPRESSION/DISCECTOMY FUSION 2 LEVELS;  Surgeon: Sinclair Ship, MD;  Location: [place];  Service: Orthopedics;  Laterality: N/A;  Anterior cervical decompression fusion, cervical 4-5, cervical 5-6 with instrumentation, allograft.  Allergies: Haldol; Acetaminophen; Ibuprofen; Metronidazole; Morphine and related; and Penicillins  Medications: Prior to Admission medications   Medication Sig Start Date End Date Taking? Authorizing Provider  ALPRAZolam (XANAX) 0.25 MG tablet Take 1 tablet (0.25 mg total) by mouth at bedtime as needed for anxiety. 07/07/15  Yes Velvet Bathe, MD  magnesium hydroxide (MILK OF MAGNESIA) 800 MG/5ML suspension Take 30 mLs by mouth daily as needed for constipation.   Yes Historical Provider, MD  Menthol-Methyl Salicylate (MUSCLE RUB) 10-15 % CREA Apply 1 application topically daily as needed for muscle pain.   Yes Historical Provider, MD  OxyCODONE (OXYCONTIN) 20 mg T12A 12 hr tablet Take 2 tablets (40 mg total) by mouth every 8 (eight) hours. 07/12/15  Yes Truitt Merle, MD  Oxycodone HCl  10 MG TABS Take 1 tablet (10 mg total) by mouth every 4 (four) hours as needed (moderate pain, severe pain, breakthrough pain). 07/12/15  Yes Truitt Merle, MD  Sennosides-Docusate Sodium (SENNA S PO) Take 2 tablets by mouth 2 (two) times daily. 06/21/15  Yes Historical Provider, MD  DULoxetine (CYMBALTA) 30 MG capsule Take 1 capsule (30 mg total) by mouth daily. Patient not taking: Reported on 07/30/2015 05/06/15   Modena Jansky, MD  feeding supplement (BOOST / RESOURCE BREEZE) LIQD Take 1 Container by mouth 3 (three) times daily between meals. Patient not taking: Reported on 08/06/2015 07/07/15   Velvet Bathe, MD  metoCLOPramide (REGLAN) 5 MG tablet Take 1 tablet (5 mg total) by mouth 3 (three) times daily before meals. Patient not taking: Reported on 07/22/2015 03/12/15   Oswald Hillock, MD  omeprazole (PRILOSEC) 20 MG capsule Take 1 capsule (20 mg total) by mouth 2 (two) times daily before a meal. Patient not taking: Reported on 08/09/2015 03/26/15   Tyler Pita, MD  ondansetron (ZOFRAN-ODT) 8 MG disintegrating tablet Take 1 tablet (8 mg total) by mouth every 6 (six) hours as needed for nausea or vomiting. Patient not taking: Reported on 08/17/2015 04/28/15   Truitt Merle, MD     Family History  Problem Relation Age of Onset  . Hypertension Father     deceased  . Diabetes Father   . Heart disease Father   . Hyperlipidemia Father   . Hypertension Mother   . Hyperlipidemia Mother   . Diabetes Mother   . Hypertension Sister   . Hyperlipidemia Sister   . Hypertension Brother   . Hyperlipidemia Brother     Social History   Social History  . Marital Status: Widowed    Spouse Name: N/A  . Number of Children: N/A  . Years of Education: N/A   Occupational History  . n/a     patient draws SNN/[employer]   Social History Main Topics  . Smoking status: Former Smoker -- 0.25 packs/day for 10 years    Types: Cigarettes    Quit date: 07/07/2013  . Smokeless tobacco: Never Used     Comment: Smokes pk q 3 days    marajuna aug  . Alcohol Use: No     Comment: quit 75yr ago  . Drug Use: Yes    Special: Hydrocodone, Marijuana     Comment: "maybe about once a month"  . Sexual Activity: No   Other Topics Concern  . None   Social History Narrative    Review of Systems: A 12 point ROS discussed and pertinent positives are indicated in the HPI above.  All other systems are negative.  Review of Systems  Constitutional: Positive for activity  change. Negative for fever.  Respiratory: Negative for shortness of breath.   Neurological: Positive for weakness.  Psychiatric/Behavioral: Positive for behavioral problems. Negative for agitation.    Vital Signs: BP 158/87 mmHg  Pulse 125  Temp(Src) 98 F (36.7 C) (Oral)  Resp 29  Ht '5\' 4"'$  (1.626 m)  Wt 110 lb 7.2 oz (50.1 kg)  BMI 18.95 kg/m2  SpO2 100%  Physical Exam  Cardiovascular: Normal rate.   Pulmonary/Chest: Effort normal and breath sounds normal.  Abdominal: Soft. Bowel sounds are normal.  Musculoskeletal: Normal range of motion.  Neurological: She is alert.  Pt follow me in room; looks at me directly Not speaking today; will not answer questions    Skin: Skin is warm.  Psychiatric:  Consented with dtr over phone  Nursing note and vitals reviewed.   Mallampati Score:  MD Evaluation Airway: WNL Heart: WNL Abdomen: WNL Chest/ Lungs: WNL ASA  Classification: 3 Mallampati/Airway Score: Two  Imaging: Dg Chest 1 View  08/02/2015   CLINICAL DATA:  Shortness of Breath  EXAM: CHEST  1 VIEW  COMPARISON:  08/01/2015  FINDINGS: Right Port-A-Cath remains in place, unchanged. Worsening bilateral airspace opacities. Heart is normal size. No visible effusions.  IMPRESSION: Worsening bilateral airspace opacities could reflect edema or infection.   Electronically Signed   By: Rolm Baptise M.D.   On: 08/02/2015 10:00   Ct Head Wo Contrast  08/16/2015   CLINICAL DATA:  Code stroke, seizure. Slurred speech. Brain tumor resection 2009  EXAM: CT  HEAD WITHOUT CONTRAST  TECHNIQUE: Contiguous axial images were obtained from the base of the skull through the vertex without intravenous contrast.  COMPARISON:  06/15/2015  FINDINGS: Left frontal encephalomalacia reidentified. Mild cortical volume loss noted with proportional ventricular prominence. Areas of periventricular white matter hypodensity are most compatible with small vessel ischemic change. No acute hemorrhage, infarct, or mass lesion is identified. No midline shift. Moderate sphenoid sinusitis. Left frontal craniotomy defect. Areas of relative subjective osteopenia are reidentified in the skull.  IMPRESSION: Chronic stable findings as above. No new acute finding. Critical Value/emergent results were called by telephone at the time of interpretation on 08/20/2015 at 4:31 pm to Dr. Janann Colonel, who verbally acknowledged these results.   Electronically Signed   By: Conchita Paris M.D.   On: 07/24/2015 16:33   Mr [name] Head Wo Contrast  07/26/2015   CLINICAL DATA:  Acute onset of right-sided facial droop and aphasia. Known seizure disorder. Personal history of brain tumor.  EXAM: MRI HEAD WITHOUT CONTRAST  MRA HEAD WITHOUT CONTRAST  TECHNIQUE: Multiplanar, multiecho pulse sequences of the brain and surrounding structures were obtained without intravenous contrast. Angiographic images of the head were obtained using MRA technique without contrast.  COMPARISON:  CT head without contrast 08/20/2015. MRI brain 02/26/2015.  FINDINGS: MRI HEAD FINDINGS  The diffusion-weighted images demonstrate acute nonhemorrhagic infarct involving the left insular cortex and frontal operculum. No acute hemorrhage or mass lesion is present. Minimal T2 changes are associated with the acute infarct. Or remote encephalomalacia in the anterior left frontal lobe is again noted.  Mild periventricular T2 changes are otherwise stable.  Flow present in the major intracranial arteries. Bilateral lens replacements are noted. Extensive mucosal  thickening is present in the left sphenoid sinus and to lesser extent the right sphenoid sinus. The remaining paranasal sinuses and the mastoid air cells are clear.  Remote blood products are associated with the left frontal craniotomy. Extensive osseous metastases are noted in the calvarium.  MRA  HEAD FINDINGS  The internal carotid arteries demonstrate minimal atherosclerotic changes the cavernous segments bilaterally, left greater than right. Mild atherosclerotic irregularity is present within the A1 segments bilaterally. The distal left M1 occlusion is present. And early inferior left MCA branch is maintained. There is mild distal attenuation of MCA and ACA branch vessels.  The right vertebral artery bifurcates at the PICA ascending a small branch to the basilar artery. The left vertebral artery demonstrates a high-grade stenosis just beyond the dura in terminates and a small left PICA. 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 This may be the termination of the native occluded left vertebral artery. A distal left vertebral stump is noted. The basilar artery is small. Both posterior cerebral arteries originate from the basilar tip. There is a moderate proximal stenosis on the right. A left posterior communicating artery contributes. Moderate attenuation of PCA branch vessels is worse on the right.  IMPRESSION: 1. Acute nonhemorrhagic infarct involving the left insular cortex and frontal operculum. 2. The distal left M1 segment is occluded with a single early anterior inferior MCA branch vessel preserved. 3. 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 4. The right vertebral artery is small sudden only a small branch to the basilar artery. 5. Moderate stenosis of the proximal right P1 segment. 6. Diffuse small vessel disease. 7. Encephalomalacia in the anterior left frontal lobe associated with a  left frontal craniotomy. 8. 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 These results were called by telephone at the time of interpretation on 08/07/2015 at 9:40 pm to Dr. Davonna Belling , who verbally acknowledged these results.   Electronically Signed   By: San Morelle M.D.   On: 07/23/2015 21:40   Mr Brain Wo Contrast  08/17/2015   CLINICAL DATA:  Acute onset of right-sided facial droop and aphasia. Known seizure disorder. Personal history of brain tumor.  EXAM: MRI HEAD WITHOUT CONTRAST  MRA HEAD WITHOUT CONTRAST  TECHNIQUE: Multiplanar, multiecho pulse sequences of the brain and surrounding structures were obtained without intravenous contrast. Angiographic images of the head were obtained using MRA technique without contrast.  COMPARISON:  CT head without contrast 08/17/2015. MRI brain 02/26/2015.  FINDINGS: MRI HEAD FINDINGS  The diffusion-weighted images demonstrate acute nonhemorrhagic infarct involving the left insular cortex and frontal operculum. No acute hemorrhage or mass lesion is present. Minimal T2 changes are associated with the acute infarct. Or remote encephalomalacia in the anterior left frontal lobe is again noted.  Mild periventricular T2 changes are otherwise stable.  Flow present in the major intracranial arteries. Bilateral lens replacements are noted. Extensive mucosal thickening is present in the left sphenoid sinus and to lesser extent the right sphenoid sinus. The remaining paranasal sinuses and the mastoid air cells are clear.  Remote blood products are associated with the left frontal craniotomy. Extensive osseous metastases are noted in the calvarium.  MRA HEAD FINDINGS  The internal carotid arteries demonstrate minimal atherosclerotic changes the cavernous segments bilaterally, left greater than right. Mild atherosclerotic irregularity is present within the A1 segments bilaterally. The distal left M1 occlusion is present. And early inferior left MCA branch is  maintained. There is mild distal attenuation of MCA and ACA branch vessels.  The right vertebral artery bifurcates at the PICA ascending a small branch to the basilar artery. The left vertebral artery demonstrates a high-grade stenosis just beyond the dura in terminates and  a small left PICA. 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 This may be the termination of the native occluded left vertebral artery. A distal left vertebral stump is noted. The basilar artery is small. Both posterior cerebral arteries originate from the basilar tip. There is a moderate proximal stenosis on the right. A left posterior communicating artery contributes. Moderate attenuation of PCA branch vessels is worse on the right.  IMPRESSION: 1. Acute nonhemorrhagic infarct involving the left insular cortex and frontal operculum. 2. The distal left M1 segment is occluded with a single early anterior inferior MCA branch vessel preserved. 3. 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 4. The right vertebral artery is small sudden only a small branch to the basilar artery. 5. Moderate stenosis of the proximal right P1 segment. 6. Diffuse small vessel disease. 7. Encephalomalacia in the anterior left frontal lobe associated with a left frontal craniotomy. 8. 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 These results were called by telephone at the time of interpretation on 07/24/2015 at 9:40 pm to Dr. Davonna Belling , who verbally acknowledged these results.   Electronically Signed   By: San Morelle M.D.   On: 08/13/2015 21:40   Dg Chest Port 1 View  08/01/2015   CLINICAL DATA:  Acute respiratory failure with hypoxemia.  EXAM: PORTABLE CHEST - 1 VIEW  COMPARISON:  Chest radiograph 07/04/2015.  PET-CT 04/01/2015  FINDINGS: Tip of the right chest port in the mid SVC. Cardiomediastinal contours are unchanged. Chain  sutures at the right hilum. There is increased retrocardiac opacity at the medial left base. Question of small bilateral pleural effusions. Increased interstitial thickening from prior exam, most prominent in the perihilar regions. No pneumothorax. Known bony metastatic disease is not well seen radiographically.  IMPRESSION: 1. Increased interstitial thickening from prior exam, may reflect pulmonary edema. 2. Probable small bilateral pleural effusions. Retrocardiac opacity may be related to pleural effusion, atelectasis, or pneumonia. Continued radiographic follow-up is recommended.   Electronically Signed   By: Jeb Levering M.D.   On: 08/01/2015 21:20   Dg Abd 2 Views  08/02/2015   CLINICAL DATA:  Epistaxis and rales  EXAM: ABDOMEN - 2 VIEW  COMPARISON:  July 04, 2015  FINDINGS: Supine and left lateral decubitus images obtained. There is a paucity of gas. There is diffuse stool throughout the colon. No free air is apparent. There is postoperative change in lumbosacral region on the left.  There is a linear radiopaque foreign body either in or overlying the left lower pelvis.  IMPRESSION: Paucity of gas. While this finding may be seen normally, it raises question of underlying ileus or enteritis. Obstruction felt to be less likely. Diffuse stool throughout colon. Note that the rectum is distended with stool.  There is a linear radiopaque foreign body either in or overlying the lower left pelvis of uncertain etiology. This area is only imaged well on the frontal view, and dislocation cannot be confirmed currently in two views.   Electronically Signed   By: Lowella Grip III M.D.   On: 08/02/2015 10:01   Dg Abd Acute W/chest  07/04/2015   CLINICAL DATA:  Abdominal pain.  EXAM: DG ABDOMEN ACUTE W/ 1V CHEST  COMPARISON:  Chest x-ray dated 06/02/2015 and CT scan of the abdomen dated 04/30/2015  FINDINGS: The patient has new diffuse interstitial accentuation with Kerley B-lines at the right lung base.  Overall heart size and pulmonary vascularity are normal. Power port in place. No effusions. No free air or free fluid in the abdomen. Bowel gas pattern is normal. There is moderate stool in the colon including in the rectum.  The patient has known diffuse osseous metastatic disease. No pathologic fractures.  IMPRESSION: 1. New diffuse interstitial accentuation with Kerley B-lines at the right base. The findings are consistent with interstitial edema which may be noncardiac in origin. 2. Extensive stool in the colon without obstructive signs.   Electronically Signed   By: Lorriane Shire M.D.   On: 07/04/2015 20:06    Labs:  CBC:  Recent Labs  07/30/15 0430 08/01/15 0450 08/01/15 2153 08/02/15 0440  WBC 5.3 4.7 3.6* 3.7*  HGB 9.5* 9.0* 7.7* 7.9*  HCT 29.5* 28.1* 23.6* 25.1*  PLT 23* 15* 19* 57*    COAGS:  Recent Labs  04/15/15 1731 04/30/15 0915 07/04/15 2158 08/13/2015 1642 08/01/15 1610  INR 1.47 1.54* 1.26 1.39 1.63*  APTT 32 '27 27 27  '$ --     BMP:  Recent Labs  07/29/15 0640 07/30/15 0430 08/01/15 0450 08/02/15 0440  NA 136 139 144 143  K 3.7 3.3* 3.9 2.9*  CL 99* 103 112* 110  CO2 26 24 18* 25  GLUCOSE 95 96 99 169*  BUN 21* 18 24* 21*  CALCIUM 9.1 9.1 9.5 8.8*  CREATININE 0.63 0.59 0.72 0.42*  GFRNONAA >60 >60 >60 >60  GFRAA >60 >60 >60 >60    LIVER FUNCTION TESTS:  Recent Labs  06/15/15 1519 07/04/15 1440 07/12/15 1216 08/14/2015 1642  BILITOT 0.5 1.1 0.55 0.9  AST 25 40 26 32  ALT 12* 17 13 13*  ALKPHOS 306* 340* 302* 321*  PROT 5.7* 5.9* 5.9* 5.4*  ALBUMIN 2.9* 2.9* 2.9* 2.8*    TUMOR MARKERS: No results for input(s): AFPTM, CEA, CA199, CHROMGRNA in the last 8760 hours.  Assessment and Plan:  New CVA FTT Dysphagia Need for long term care Hx Lung Ca; brain tumor Thrombocytopenia Consult for possible percutaneous gastric tube placement Technically appropriate But, would need plt closer to 100; and bowel cleansed/prepped secondary  diffuse stool in bowel (MD to consider recommendations. Please move forward with placing appropriate orders if want to move ahead---we will check chart in am)  Risks and Benefits discussed with the patient's including, but not limited to the need for a barium enema during the procedure, bleeding, infection, peritonitis, or damage to adjacent structures. All of her questions were answered, she is agreeable to proceed. Consent signed and in chart.  IR PA will check chart; labs in am---if all aspects more appropriate we could possibly place G tube 9/13   Thank you for this interesting consult.  I greatly enjoyed meeting Montgomery Rothlisberger and look forward to participating in their care.  A copy of this report was sent to the requesting provider on this date.  Signed: Tammie Yanda A 08/02/2015, 10:58 AM   I spent a total of 40 Minutes    in face to face in clinical consultation, greater than 50% of which was counseling/coordinating care for perc G tube

## 2015-08-02 NOTE — Progress Notes (Signed)
Internal Medicine Night Float Interim Progress Note  S: Follow up on [name] [name] for epistaxis and spontaneous bleeding from thrombocytopenia. Per nurse, patient is doing better and is maintaining her airway. This patient was discussed with PCCM earlier who kindly saw our patient. They're recommedations were to continue platelet transfusion and BP control in order to control the bleeding. They're was no indication for intubation as patient is satting well on room air and maintaining her airway. Patient was also discussed with ENT who did not feel patient would be a good candidate for nasal packing, especially if the problem was posterior nasopharynx. They agreed with platelet transfusion, BP control to control bleeding in addition to Afrin, Nasal Saline and humidified oxygen.   O: Physical Exam  Filed Vitals:   08/02/15 0045 08/02/15 0138 08/02/15 0145 08/02/15 0200  BP: 156/90 169/96 147/75 118/72  Pulse: 115  68 114  Temp:      TempSrc:      Resp: '28  26 27  '$ Height:      Weight:      SpO2: 100%  100% 100%   General: Vital signs reviewed. [name] well-developed and well-nourished, in no acute distress and cooperative with exam.   HEENT: Crusted blood in nares, frank bright red blood in oral cavity.  Cardiovascular: Tachycardic, regular rhythm, S1 normal, S2 normal. Pulmonary/Chest: Diffuse rhonchi, no wheezes, rales. No accessory muscle use. Neurological: Awake, alert  CXR: Increased interstitial thickening from prior exam, may reflect pulmonary edema. Probable small bilateral pleural effusions. Retrocardiac opacity may be related to pleural effusion, atelectasis, or pneumonia.  ABG: pH 7.446, pCO3 31.9, pO2 66.3, bicarb 21.6, O2 sat 92.7.   A/P: Epistaxis/Spontaneous Posterior Oropharynx Bleeding: BP improved on hydralazine and labetalol. [name] s/p 1 unit platelets. Platelets 15>19. Hemoglobin 9.0>7.7. Maintaining airway, no current indication for intubation. Patient discussed  with PCCM, please see above. Patient discussed with ENT who did not feel patient would be a good candidate for nasal packing and to continue conservative measures and control bleeding first.  -Transfuse platelets -Control BP -Suction as needed -Humidied O2 as needed -Afrin BID -Ocean Nasal Spray -Repeat CBC in the morning

## 2015-08-03 ENCOUNTER — Inpatient Hospital Stay (HOSPITAL_COMMUNITY): Payer: Medicare HMO

## 2015-08-03 DIAGNOSIS — Z2252 Carrier of viral hepatitis C: Secondary | ICD-10-CM

## 2015-08-03 DIAGNOSIS — K1230 Oral mucositis (ulcerative), unspecified: Secondary | ICD-10-CM

## 2015-08-03 LAB — BASIC METABOLIC PANEL
Anion gap: 6 (ref 5–15)
BUN: 19 mg/dL (ref 6–20)
CALCIUM: 9.2 mg/dL (ref 8.9–10.3)
CHLORIDE: 111 mmol/L (ref 101–111)
CO2: 25 mmol/L (ref 22–32)
CREATININE: 0.37 mg/dL — AB (ref 0.44–1.00)
GFR calc Af Amer: >60 mL/min (ref >60)
GFR calc non Af Amer: >60 mL/min (ref >60)
GLUCOSE: 131 mg/dL — AB (ref 65–99)
Potassium: 3.1 mmol/L — ABNORMAL LOW (ref 3.5–5.1)
Sodium: 142 mmol/L (ref 135–145)

## 2015-08-03 LAB — CBC
HEMATOCRIT: 23.7 % — AB (ref 36.0–46.0)
Hemoglobin: 7.6 g/dL — ABNORMAL LOW (ref 12.0–15.0)
MCH: 28.4 pg (ref 26.0–34.0)
MCHC: 32.1 g/dL (ref 30.0–36.0)
MCV: 88.4 fL (ref 78.0–100.0)
Platelets: 28 10*3/uL — CL (ref 150–400)
RBC: 2.68 MIL/uL — ABNORMAL LOW (ref 3.87–5.11)
RDW: 20.4 % — AB (ref 11.5–15.5)
WBC: 5 10*3/uL (ref 4.0–10.5)

## 2015-08-03 LAB — MAGNESIUM: MAGNESIUM: 2.1 mg/dL (ref 1.7–2.4)

## 2015-08-03 MED ORDER — POTASSIUM CHLORIDE 10 MEQ/100ML IV SOLN
10.0000 meq | INTRAVENOUS | Status: AC
  Administered 2015-08-03 (×4): 10 meq via INTRAVENOUS
  Filled 2015-08-03 (×2): qty 100

## 2015-08-03 MED ORDER — SODIUM CHLORIDE 0.9 % IV SOLN
Freq: Once | INTRAVENOUS | Status: DC
Start: 2015-08-03 — End: 2015-08-05

## 2015-08-03 NOTE — Progress Notes (Signed)
[name] [name]   DOB:1951-01-29   VO#:[id number]   EXH#:[id number]  Subjective: Pt has had bloody secretion in mouth, received multiple platelet transfusion in the past few days, waiting for disposition, family meeting to address the goal of care is scheduled for tomorrow. I spoke with her nurse today, she did not eat anything today, refuses suction, did some physical therapy in bed with PT, but did not get out of bed today.    Objective:  Filed Vitals:   08/03/15 1712  BP: 137/98  Pulse:   Temp: 97.9 F (36.6 C)  Resp:     Body mass index is 18.95 kg/(m^2).  Intake/Output Summary (Last 24 hours) at 08/03/15 1804 Last data filed at 08/03/15 1651  Gross per 24 hour  Intake   1281 ml  Output    150 ml  Net   1131 ml     Sclerae unicteric  Oropharynx clear  No peripheral adenopathy  Lungs clear -- no rales or rhonchi  Heart regular rate and rhythm  Abdomen benign  MSK no focal spinal tenderness, no peripheral edema  Neuro nonfocal  Dry blood on lips, no active bleeding. Pt is gurgling    CBG (last 3)   Recent Labs  08/01/15 0757  GLUCAP 107*     Labs:  Lab Results  Component Value Date   WBC 5.0 08/03/2015   HGB 7.6* 08/03/2015   HCT 23.7* 08/03/2015   MCV 88.4 08/03/2015   PLT 28* 08/03/2015   NEUTROABS 4.7 08/04/2015    '@LASTCHEMISTRY'$ @  Urine Studies No results for input(s): UHGB, CRYS in the last 72 hours.  Invalid input(s): UACOL, UAPR, USPG, UPH, UTP, UGL, [place], UBIL, UNIT, UROB, [place], UEPI, UWBC, Duwayne Heck [place], [place]  Basic Metabolic Panel:  Recent Labs Lab 07/29/15 [phone] 07/29/15 0710 07/30/15 0430 08/01/15 0450 08/02/15 0440 08/03/15 0433 08/03/15 0950  NA 136  --  139 144 143 142  --   K 3.7  --  3.3* 3.9 2.9* 3.1*  --   CL 99*  --  103 112* 110 111  --   CO2 26  --  24 18* 25 25  --   GLUCOSE 95  --  96 99 169* 131*  --   BUN 21*  --  18 24* 21* 19  --   CREATININE 0.63  --  0.59 0.72 0.42* 0.37*  --   CALCIUM 9.1  --  9.1  9.5 8.8* 9.2  --   MG  --  2.1  --   --   --   --  2.1   GFR Estimated Creatinine Clearance: 54.7 mL/min (by C-G formula based on Cr of 0.37). Liver Function Tests:  Recent Labs Lab 08/19/2015 1642  AST 32  ALT 13*  ALKPHOS 321*  BILITOT 0.9  PROT 5.4*  ALBUMIN 2.8*   No results for input(s): LIPASE, AMYLASE in the last 168 hours. No results for input(s): AMMONIA in the last 168 hours. Coagulation profile  Recent Labs Lab 07/27/2015 1642 08/01/15 1610  INR 1.39 1.63*    CBC:  Recent Labs Lab 08/14/2015 1642  07/30/15 0430 08/01/15 0450 08/01/15 2153 08/02/15 0440 08/03/15 0433  WBC 6.1  < > 5.3 4.7 3.6* 3.7* 5.0  NEUTROABS 4.7  --   --   --   --   --   --   HGB 9.9*  < > 9.5* 9.0* 7.7* 7.9* 7.6*  HCT 30.8*  < > 29.5* 28.1* 23.6* 25.1* 23.7*  MCV 89.0  < > 89.4 89.8 88.4 89.3 88.4  PLT 30*  < > 23* 15* 19* 57* 28*  < > = values in this interval not displayed. Cardiac Enzymes: No results for input(s): CKTOTAL, CKMB, CKMBINDEX, TROPONINI in the last 168 hours. BNP: Invalid input(s): POCBNP CBG:  Recent Labs Lab 08/18/2015 1631 07/29/15 0651 07/29/15 1128 07/31/15 1101 08/01/15 0757  GLUCAP 130* 97 84 93 107*   D-Dimer No results for input(s): DDIMER in the last 72 hours. Hgb A1c No results for input(s): HGBA1C in the last 72 hours. Lipid Profile No results for input(s): CHOL, HDL, LDLCALC, TRIG, CHOLHDL, LDLDIRECT in the last 72 hours. Thyroid function studies No results for input(s): TSH, T4TOTAL, T3FREE, THYROIDAB in the last 72 hours.  Invalid input(s): FREET3 Anemia work up No results for input(s): VITAMINB12, FOLATE, FERRITIN, TIBC, IRON, RETICCTPCT in the last 72 hours. Microbiology Recent Results (from the past 240 hour(s))  MRSA PCR Screening     Status: None   Collection Time: 08/02/15  8:46 PM  Result Value Ref Range Status   MRSA by PCR NEGATIVE NEGATIVE Final    Comment:        The GeneXpert MRSA Assay (FDA approved for NASAL  specimens only), is one component of a comprehensive MRSA colonization surveillance program. It is not intended to diagnose MRSA infection nor to guide or monitor treatment for MRSA infections.       Studies:  Dg Chest 1 View  08/02/2015   CLINICAL DATA:  Shortness of Breath  EXAM: CHEST  1 VIEW  COMPARISON:  08/01/2015  FINDINGS: Right Port-A-Cath remains in place, unchanged. Worsening bilateral airspace opacities. Heart is normal size. No visible effusions.  IMPRESSION: Worsening bilateral airspace opacities could reflect edema or infection.   Electronically Signed   By: Rolm Baptise M.D.   On: 08/02/2015 10:00   Dg Chest Port 1 View  08/03/2015   CLINICAL DATA:  Pulmonary edema.  Spitting up bloody sputum.  EXAM: PORTABLE CHEST - 1 VIEW  COMPARISON:  August 02, 2015.  FINDINGS: The heart size and mediastinal contours are within normal limits. No pneumothorax or significant pleural effusion is noted. Stable bilateral lung opacities are noted concerning for pneumonia or possibly edema. Right internal jugular Port-A-Cath is noted with distal tip in the expected position of the cavoatrial junction. The visualized skeletal structures are unremarkable.  IMPRESSION: Stable bilateral lung opacities are noted concerning for pneumonia or edema.   Electronically Signed   By: Marijo Conception, M.D.   On: 08/03/2015 15:29   Dg Chest Port 1 View  08/01/2015   CLINICAL DATA:  Acute respiratory failure with hypoxemia.  EXAM: PORTABLE CHEST - 1 VIEW  COMPARISON:  Chest radiograph 07/04/2015.  PET-CT 04/01/2015  FINDINGS: Tip of the right chest port in the mid SVC. Cardiomediastinal contours are unchanged. Chain sutures at the right hilum. There is increased retrocardiac opacity at the medial left base. Question of small bilateral pleural effusions. Increased interstitial thickening from prior exam, most prominent in the perihilar regions. No pneumothorax. Known bony metastatic disease is not well seen  radiographically.  IMPRESSION: 1. Increased interstitial thickening from prior exam, may reflect pulmonary edema. 2. Probable small bilateral pleural effusions. Retrocardiac opacity may be related to pleural effusion, atelectasis, or pneumonia. Continued radiographic follow-up is recommended.   Electronically Signed   By: Jeb Levering M.D.   On: 08/01/2015 21:20   Dg Abd 2 Views  08/02/2015   CLINICAL DATA:  Epistaxis  and rales  EXAM: ABDOMEN - 2 VIEW  COMPARISON:  July 04, 2015  FINDINGS: Supine and left lateral decubitus images obtained. There is a paucity of gas. There is diffuse stool throughout the colon. No free air is apparent. There is postoperative change in lumbosacral region on the left.  There is a linear radiopaque foreign body either in or overlying the left lower pelvis.  IMPRESSION: Paucity of gas. While this finding may be seen normally, it raises question of underlying ileus or enteritis. Obstruction felt to be less likely. Diffuse stool throughout colon. Note that the rectum is distended with stool.  There is a linear radiopaque foreign body either in or overlying the lower left pelvis of uncertain etiology. This area is only imaged well on the frontal view, and dislocation cannot be confirmed currently in two views.   Electronically Signed   By: Lowella Grip III M.D.   On: 08/02/2015 10:01    Assessment: 64 y.o.   1. Acute left MCA infarct 2.  Metastatic lung cancer, terminal stage, not a candidate for treatment due to her PS and thrombocytopenia  3. Anemia and thrombocytopenia, secondary to #2 and diffuse bone mets 4. Mucosal bleeding secondary to thrombocytopenia, need to rule out DIC  4. DM 5. Hep c   Plan:  -Please check DIC panel, her INR was elevated on 9/11, possible DIC from underline malignancy -if her fibrinogen is less than 100, consider cryo  -[place] may help with her bleeding, but will increase her risk of thrombosis. Given her recent ischemic stroke, the  risk if probably outweight benefit. If she has DIC, it would be contraindicated -I called her daughter [name]. But her phone was disconnected and I was not able to reach her again. I left a message for her: i do not recommend feeding tube. i strongly believe going home with hospice and comfort care is probably her best interest. Andoria previously told me that she does not want her mom to go to inpt hospice.    I will follow up if needed, please call me if anything I can help with.    Truitt Merle, MD 08/03/2015  6:04 PM

## 2015-08-03 NOTE — Progress Notes (Signed)
CSW provided bed offers to pt daughter- pt daughter chooses [facility].  CSW informed pt daughter that there might be barriers with insurance approval due to pt being a poor rehab candidate (only 1 bed offer due to pt illness and poor prognosis)- pt daughter expressed understanding  CSW will continue to follow.  Domenica Reamer, [place] Social Worker [phone]

## 2015-08-03 NOTE — Progress Notes (Signed)
Rn asked RT to NT suction pt due to pt being unable to cough up secretions and sounding as if she was struggling. Pt NT suctioned x1 through R nare, pt pulled catheter out however. Pt also suctioned with 74F catheter down the back of the throat. RT removed moderate amount of [place]/red old thick blood. Rn made aware. RT will continue to monitor

## 2015-08-03 NOTE — Progress Notes (Signed)
Noted to be congested and appears to be filling up with fluid. Attempted suctioning and was able to get up some off and on this shift. Tol suctioning fairly well at times will try to take the yanker from your hand.

## 2015-08-03 NOTE — Progress Notes (Signed)
#  1 unit platelets up; VSS and properly witnessed before transfusing.

## 2015-08-03 NOTE — Progress Notes (Signed)
CRITICAL VALUE ALERT  Critical value received:  Platelets 28  Date of notification:  08/03/2015  Time of notification:  1694  Critical value read back:Yes.    Nurse who received alert:  Brien Mates RN  MD notified (1st page):  Lindon Romp MD  Time of first page:  0555  MD notified (2nd page):  Time of second page:  Responding MD:  Lindon Romp MD  Time MD responded:  [phone]

## 2015-08-03 NOTE — Progress Notes (Signed)
Subjective: Patient seen and examined at bedside today.  No family is present.  Patient states no new complaints.  Still has pain "all over".  Asked if she still wanted to proceed with feeding tube placement.  Patient responded yes.  Platelet count this morning of 28, down from 57 yesterday s/p 3 units platelets transfused.  When seen again later on rounds, patient less alert, with blood in her mouth and having difficulty able to clear secretions.  Spoke with daughter, Manjit Bufano, this afternoon about sitting down for a GOC discussion tomorrow and she is agreeable to do so.  She was unable to provide a specific time but states hopeful to be able to get there before 2pm.  Objective: Vital signs in last 24 hours: Filed Vitals:   08/03/15 0000 08/03/15 0400 08/03/15 0859 08/03/15 1241  BP: 151/102 139/80 164/89 156/105  Pulse: 126 122 127 128  Temp: 98 F (36.7 C) 97.8 F (36.6 C) 97.1 F (36.2 C) 97.7 F (36.5 C)  TempSrc: Oral Axillary Axillary Axillary  Resp: '18 26 24 '$ 33  Height:      Weight:      SpO2: 100% 100% 97% 97%   Weight change:   Intake/Output Summary (Last 24 hours) at 08/03/15 1356 Last data filed at 08/03/15 0601  Gross per 24 hour  Intake   1483 ml  Output    150 ml  Net   1333 ml   General: lying in bed, awake, able to follow commands, answer questions, has secretions, dried blood around corner of her mouth with some blood in her mouth as well. HEENT: EOMI, no scleral icterus Cardiac: regular rhythm, rate is still tachycardic, no rubs, murmurs or gallops Pulm: has some coarse breath sounds diffusely.  Abd: soft, tender to palpation based on response.  She is nondistended, BS present Neuro: alert, cranial nerves II-XII grossly intact  Lab Results: Basic Metabolic Panel:  Recent Labs Lab 07/29/15 0710  08/02/15 0440 08/03/15 0433 08/03/15 0950  NA  --   < > 143 142  --   K  --   < > 2.9* 3.1*  --   CL  --   < > 110 111  --   CO2  --   < > 25 25   --   GLUCOSE  --   < > 169* 131*  --   BUN  --   < > 21* 19  --   CREATININE  --   < > 0.42* 0.37*  --   CALCIUM  --   < > 8.8* 9.2  --   MG 2.1  --   --   --  2.1  < > = values in this interval not displayed. Liver Function Tests:  Recent Labs Lab 08/08/2015 1642  AST 32  ALT 13*  ALKPHOS 321*  BILITOT 0.9  PROT 5.4*  ALBUMIN 2.8*   CBC:  Recent Labs Lab 08/04/2015 1642  08/02/15 0440 08/03/15 0433  WBC 6.1  < > 3.7* 5.0  NEUTROABS 4.7  --   --   --   HGB 9.9*  < > 7.9* 7.6*  HCT 30.8*  < > 25.1* 23.7*  MCV 89.0  < > 89.3 88.4  PLT 30*  < > 57* 28*  < > = values in this interval not displayed. CBG:  Recent Labs Lab 07/27/2015 1631 07/29/15 0651 07/29/15 1128 07/31/15 1101 08/01/15 0757  GLUCAP 130* 97 84 93 107*   Fasting Lipid Panel:  Recent Labs Lab 07/29/15 0640  CHOL 119  HDL 43  LDLCALC 50  TRIG 130  CHOLHDL 2.8   Coagulation:  Recent Labs Lab 08/12/2015 1642 08/01/15 1610  LABPROT 17.1* 19.4*  INR 1.39 1.63*   Anemia Panel: No results for input(s): VITAMINB12, FOLATE, FERRITIN, TIBC, IRON, RETICCTPCT in the last 168 hours. Urine Drug Screen: Drugs of Abuse     Component Value Date/Time   LABOPIA POSITIVE* 08/04/2015 1852   COCAINSCRNUR NONE DETECTED 07/24/2015 1852   LABBENZ POSITIVE* 08/16/2015 1852   AMPHETMU NONE DETECTED 08/18/2015 1852   THCU POSITIVE* 08/18/2015 1852   LABBARB NONE DETECTED 07/26/2015 1852    Alcohol Level:  Recent Labs Lab 08/19/2015 1642  ETH <5   Urinalysis:  Recent Labs Lab 08/20/2015 1852  COLORURINE AMBER*  LABSPEC 1.016  PHURINE 8.5*  GLUCOSEU NEGATIVE  HGBUR NEGATIVE  BILIRUBINUR NEGATIVE  KETONESUR 15*  PROTEINUR 30*  UROBILINOGEN 1.0  NITRITE POSITIVE*  LEUKOCYTESUR LARGE*    Micro Results: Recent Results (from the past 240 hour(s))  MRSA PCR Screening     Status: None   Collection Time: 08/02/15  8:46 PM  Result Value Ref Range Status   MRSA by PCR NEGATIVE NEGATIVE Final     Comment:        The GeneXpert MRSA Assay (FDA approved for NASAL specimens only), is one component of a comprehensive MRSA colonization surveillance program. It is not intended to diagnose MRSA infection nor to guide or monitor treatment for MRSA infections.    Studies/Results: Dg Chest 1 View  08/02/2015   CLINICAL DATA:  Shortness of Breath  EXAM: CHEST  1 VIEW  COMPARISON:  08/01/2015  FINDINGS: Right Port-A-Cath remains in place, unchanged. Worsening bilateral airspace opacities. Heart is normal size. No visible effusions.  IMPRESSION: Worsening bilateral airspace opacities could reflect edema or infection.   Electronically Signed   By: Rolm Baptise M.D.   On: 08/02/2015 10:00   Dg Chest Port 1 View  08/01/2015   CLINICAL DATA:  Acute respiratory failure with hypoxemia.  EXAM: PORTABLE CHEST - 1 VIEW  COMPARISON:  Chest radiograph 07/04/2015.  PET-CT 04/01/2015  FINDINGS: Tip of the right chest port in the mid SVC. Cardiomediastinal contours are unchanged. Chain sutures at the right hilum. There is increased retrocardiac opacity at the medial left base. Question of small bilateral pleural effusions. Increased interstitial thickening from prior exam, most prominent in the perihilar regions. No pneumothorax. Known bony metastatic disease is not well seen radiographically.  IMPRESSION: 1. Increased interstitial thickening from prior exam, may reflect pulmonary edema. 2. Probable small bilateral pleural effusions. Retrocardiac opacity may be related to pleural effusion, atelectasis, or pneumonia. Continued radiographic follow-up is recommended.   Electronically Signed   By: Jeb Levering M.D.   On: 08/01/2015 21:20   Dg Abd 2 Views  08/02/2015   CLINICAL DATA:  Epistaxis and rales  EXAM: ABDOMEN - 2 VIEW  COMPARISON:  July 04, 2015  FINDINGS: Supine and left lateral decubitus images obtained. There is a paucity of gas. There is diffuse stool throughout the colon. No free air is apparent.  There is postoperative change in lumbosacral region on the left.  There is a linear radiopaque foreign body either in or overlying the left lower pelvis.  IMPRESSION: Paucity of gas. While this finding may be seen normally, it raises question of underlying ileus or enteritis. Obstruction felt to be less likely. Diffuse stool throughout colon. Note that the rectum is distended with stool.  There is a linear radiopaque foreign body either in or overlying the lower left pelvis of uncertain etiology. This area is only imaged well on the frontal view, and dislocation cannot be confirmed currently in two views.   Electronically Signed   By: Lowella Grip III M.D.   On: 08/02/2015 10:01   Medications:  Scheduled Meds: . sodium chloride   Intravenous Once  . levETIRAcetam  500 mg Intravenous Q12H  . oxymetazoline  1 spray Each Nare BID  . scopolamine  1 patch Transdermal Q72H  . sodium chloride  3 mL Intravenous Q12H   Continuous Infusions: . dextrose 5 % and 0.45% NaCl 75 mL/hr at 08/02/15 2028   PRN Meds:.bisacodyl, hydrALAZINE, HYDROmorphone (DILAUDID) injection, labetalol, levalbuterol, MUSCLE RUB, ondansetron **OR** ondansetron (ZOFRAN) IV, phenol, sodium chloride, sodium chloride Assessment/Plan: Principal Problem:   CVA (cerebral infarction) Active Problems:   Bipolar 1 disorder   Cancer associated pain   Hypokalemia   Chronic pain   Lung cancer, primary, with metastasis from lung to other site   Thrombocytopenia   Stroke   Antineoplastic chemotherapy induced pancytopenia   Increased oropharyngeal secretions   Acute respiratory failure with hypoxemia   Epistaxis   Moderate malnutrition  Ms. Francenia Chimenti is a 64 y.o. 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   Acute stroke - left MCA territory, left VA occlusion: likely 2/2 to hypercoagulable state  2/2 to her malignancy  -appreciate neurology recommendations -continue Keppra '500mg'$  bid -LDL 50. Will not do statin. Hgb a1c 6.4.  -PT/OT recommended SNF. -holding aspirin with pancytopenia -had GOC discussion with daughter on Friday and over the weekend.  No family present yesterday or today on rounds. Has not made up her mind yet. Will continue to try to work with them. Also consulted palliative care for symptom mamagement.  They agree with scopalamine patch for short term to manage secretions, not recommended for long term.  Watch for anticholinergic side effects.   Swallow dysfunction - 2/2 to stroke. NPO now per SLP recs as of 9/12 -continue to work with SLP -may need NG tube >> PEG tube.  As of this morning, patient still wishes to proceed with PEG   -IR willing to proceed with gastrostomy but will need bowel prep, possible platelet transfusion prior to surgery.   -fleet enema given yesterday evening -CXR from 9/12 with worsening bilateral airspace opacities could reflect edema or infection.  Will get repeat image today.  Urinary tract infection: UA with bacteria, leuks, positive nitrites. -d/c ceftriaxone 1gm q24h (9/7 >> 9/11).  Pancytopenia: suspect is due to underlying malignancy -platelet trend 24 >> 23 >> 15>>57 (s/p 3 units of platelets) >>28.  Will give 1 unit platelets today and monitor her response. Can consider Amicar if needed. -no signs of epistaxis today.  However, blood present in her mouth on exam -appreciate Dr. Ernestina Penna recommendations.   Metastatic lung cancer -not candidate for chemotherapy at this time per Dr. Burr Medico -Patient previously on hospice but daughter now wishes her to be Full Code. Continue discussing [place].  Currently, daughter wishes to see how her mother will do with physical therapy efforts and wants any and all life maintaining/life prolonging therapies to be applied to her mothers care.  She does not want hospice or palliative services at this time.   Wishes for her mother to remain Full Code.  Appreciate palliative cares time  seeing the patient and her family. -PT is recommending SNF for patient.    Pain control: -dilaudid '1mg'$  q2h prn  Hypokalemia:  -repleting as necessary -3.1 today and will give 90mq KCl x 4 IV -magnesium 2.1  Hypertension -Hydralazine prn  -Labetalol prn  Epistaxis and Posterior Oropharyngeal bleeding -epistaxis currently resolved 2/2 low platelets.  However, having bleeding in her mouth today.  See above for plan on low platelets -Afrin bid -NaCl nasal spray prn  Dispo: Disposition is deferred at this time, awaiting improvement of current medical problems.    The patient does have a current PCP (Orpah GreekMCarmelia Roller MD) and [facility] after [facility].  The patient does have transportation limitations that hinder transportation to clinic appointments.    LOS: 6 days   AJule Ser DO 08/03/2015, 1:56 PM

## 2015-08-03 NOTE — Progress Notes (Signed)
Occupational Therapy Treatment Patient Details Name: [name] [name] MRN: [id number] DOB: 08/10/1951 Today's Date: 08/03/2015    History of present illness 64 y.o. female with known lung cancer, pancytopenia, seizure disorder admitted with right facial asymmetry and nonverbal. H/o left frontal brain tumor s/p resection in 2009. ACDF 2015. MRI: MRI: 08/13/2015 acute CVA involving the left insular cortex and frontal operculum. Per MD notes, Patient's diffusely metastatic lung cancer with skull mets concerning for brain mets, though MRI in April 2016 was negative.   OT comments  This 64 yo female not making progress since last session due to Medical issues (HR at rest is high and with activity goes even higher (120's-140's) and pt with just general weakness. She will continue to benefit from acute OT with follow up OT at SNF.  Follow Up Recommendations  SNF;Supervision/Assistance - 24 hour    Equipment Recommendations   (TBD at next venue)       Precautions / Restrictions Precautions Precautions: Fall Restrictions Weight Bearing Restrictions: No       Mobility Bed Mobility Overal bed mobility: Needs Assistance Bed Mobility: Rolling;Sidelying to Sit;Supine to Sit;Sit to Supine Rolling: Supervision Sidelying to sit: Min assist Supine to sit: Min assist Sit to supine: Min guard      Transfers                 General transfer comment: Did not attempt stand or transfer due to HR at rest 120s and just getting up to EOB 140s    Balance Overall balance assessment: Needs assistance Sitting-balance support: Single extremity supported Sitting balance-Leahy Scale: Fair Sitting balance - Comments: Pt sat EOB abour 8 minutes                           ADL Overall ADL's : Needs assistance/impaired     Grooming: Wash/dry face;Supervision/safety;Set up;Bed level                                                  Cognition   Behavior During  Therapy: Restless Overall Cognitive Status: No family/caregiver present to determine baseline cognitive functioning Area of Impairment: Following commands        Following Commands: Follows one step commands with increased time (and increased cues)       General Comments: diffcult to understand at times due to dysarthric speech                 Pertinent Vitals/ Pain       Pain Assessment: Faces Faces Pain Scale: Hurts even more Pain Location: bottom when performing peri-anal hygiene bed level for her Pain Descriptors / Indicators: Sore;Grimacing;Guarding Pain Intervention(s): Monitored during session;Repositioned (applied barrier cream post getting her cleaned up)         Frequency Min 2X/week     Progress Toward Goals  OT Goals(current goals can now be found in the care plan section)  Progress towards OT goals: Not progressing toward goals - comment (medical issues since last seen)     Plan Discharge plan remains appropriate    Co-evaluation    PT/OT/SLP Co-Evaluation/Treatment: Yes Reason for Co-Treatment: For patient/therapist safety   OT goals addressed during session: ADL's and self-care;Strengthening/ROM         Activity Tolerance  (limited by stating she just doesn't feel good and  wants to lay back down)   Patient Left in bed;with call bell/phone within reach;with bed alarm set           Time: 1015-1049 OT Time Calculation (min): 34 min  Charges: OT General Charges $OT Visit: 1 Procedure OT Treatments $Self Care/Home Management : 8-22 mins  Almon Register [phone] 08/03/2015, 11:05 AM

## 2015-08-03 NOTE — Progress Notes (Signed)
Physical Therapy Treatment Patient Details Name: [name] [name] MRN: [id number] DOB: Dec 19, 1950 Today's Date: 08/03/2015    History of Present Illness 64 y.o. female with known lung cancer, pancytopenia, seizure disorder admitted with right facial asymmetry and nonverbal. H/o left frontal brain tumor s/p resection in 2009. ACDF 2015. MRI: MRI: 07/26/2015 acute CVA involving the left insular cortex and frontal operculum. Per MD notes, Patient's diffusely metastatic lung cancer with skull mets concerning for brain mets, though MRI in April 2016 was negative.    PT Comments    Patient in bed, agreeable to participate in PT today. Resting HR in the 120's, RN cleared patient for limited PT/OT participation based on HR response to mobility. HR sitting EOB was in the mid 140's consistently. Patient was able to transfer as described below. Was a total assist for pericare, required cues to maintain rolled position. Patient will benefit from continued PT as HR normalizes to progress mobility.   Follow Up Recommendations  SNF;Supervision for mobility/OOB     Equipment Recommendations  Other (comment) (TBA as patient progresses)    Recommendations for Other Services       Precautions / Restrictions Precautions Precautions: Fall Precaution Comments: Increased HR Restrictions Weight Bearing Restrictions: No    Mobility  Bed Mobility Overal bed mobility: Needs Assistance Bed Mobility: Rolling;Sidelying to Sit;Supine to Sit;Sit to Supine Rolling: Supervision (Cues to remain rolled for pericare.) Sidelying to sit: Min assist Supine to sit: Min assist Sit to supine: Min guard      Transfers                 General transfer comment: Deferred due to HR to 140's sitting EOB. Resting is 120's  Ambulation/Gait                 Stairs            Wheelchair Mobility    Modified Rankin (Stroke Patients Only)       Balance Overall balance assessment: Needs  assistance Sitting-balance support: Single extremity supported Sitting balance-Leahy Scale: Fair Sitting balance - Comments: Patient able to tolerate 8 mins EOB sitting before requesting to lie back down. C/o some dizziness, used OT for back support. Difficult to maintain upright posture consistently.                            Cognition Arousal/Alertness: Awake/alert Behavior During Therapy: Restless Overall Cognitive Status: No family/caregiver present to determine baseline cognitive functioning Area of Impairment: Following commands       Following Commands: Follows one step commands with increased time       General Comments: Speech is difficult to understand, dysarthric and garbled.    Exercises General Exercises - Lower Extremity Long Arc Quad: AROM;Both;5 reps;Seated Straight Leg Raises: AROM;Both;Other reps (comment);Supine (2 reps to help adjust pillows under legs.)    General Comments        Pertinent Vitals/Pain Pain Assessment: Faces Faces Pain Scale: Hurts whole lot Pain Location: When moving legs. Says they're very sore. Pain Descriptors / Indicators: Grimacing;Sore;Guarding Pain Intervention(s): Limited activity within patient's tolerance;Monitored during session;Repositioned    Home Living                      Prior Function            PT Goals (current goals can now be found in the care plan section) Acute Rehab PT Goals PT Goal Formulation: Patient  unable to participate in goal setting Time For Goal Achievement: 08/13/15 Potential to Achieve Goals: Fair Progress towards PT goals: Progressing toward goals    Frequency  Min 3X/week    PT Plan Current plan remains appropriate    Co-evaluation PT/OT/SLP Co-Evaluation/Treatment: Yes Reason for Co-Treatment: For patient/therapist safety PT goals addressed during session: Mobility/safety with mobility;Balance;Strengthening/ROM OT goals addressed during session: ADL's and  self-care;Strengthening/ROM     End of Session   Activity Tolerance: Patient limited by fatigue;Treatment limited secondary to medical complications (Comment) (Increased HR) Patient left: in bed;with call bell/phone within reach;with nursing/sitter in room     Time: 1015-1050 PT Time Calculation (min) (ACUTE ONLY): 35 min  Charges:  $Therapeutic Activity: 8-22 mins                    G CodesRoanna Epley, SPT [phone] 08/03/2015, 11:41 AM  I have read, reviewed and agree with student's note.   [place] [phone] (pager)

## 2015-08-04 ENCOUNTER — Inpatient Hospital Stay (HOSPITAL_COMMUNITY): Payer: Medicare HMO

## 2015-08-04 ENCOUNTER — Ambulatory Visit: Payer: Self-pay | Admitting: Hematology

## 2015-08-04 ENCOUNTER — Inpatient Hospital Stay (HOSPITAL_COMMUNITY): Payer: Medicare HMO | Admitting: Anesthesiology

## 2015-08-04 ENCOUNTER — Other Ambulatory Visit: Payer: Self-pay

## 2015-08-04 DIAGNOSIS — J9601 Acute respiratory failure with hypoxia: Secondary | ICD-10-CM

## 2015-08-04 DIAGNOSIS — I4891 Unspecified atrial fibrillation: Secondary | ICD-10-CM | POA: Insufficient documentation

## 2015-08-04 DIAGNOSIS — I469 Cardiac arrest, cause unspecified: Secondary | ICD-10-CM | POA: Insufficient documentation

## 2015-08-04 DIAGNOSIS — R0600 Dyspnea, unspecified: Secondary | ICD-10-CM | POA: Insufficient documentation

## 2015-08-04 LAB — PROTIME-INR
INR: 1.44 (ref 0.00–1.49)
PROTHROMBIN TIME: 17.6 s — AB (ref 11.6–15.2)

## 2015-08-04 LAB — POCT I-STAT 3, ART BLOOD GAS (G3+)
ACID-BASE DEFICIT: 7 mmol/L — AB (ref 0.0–2.0)
BICARBONATE: 21.7 meq/L (ref 20.0–24.0)
O2 Saturation: 96 %
TCO2: 24 mmol/L (ref 0–100)
pCO2 arterial: 70.2 mmHg (ref 35.0–45.0)
pH, Arterial: 7.093 — CL (ref 7.350–7.450)
pO2, Arterial: 108 mmHg — ABNORMAL HIGH (ref 80.0–100.0)

## 2015-08-04 LAB — CBC
HCT: 23.4 % — ABNORMAL LOW (ref 36.0–46.0)
HCT: 16.9 % — ABNORMAL LOW (ref 36.0–46.0)
HEMATOCRIT: 25.1 % — AB (ref 36.0–46.0)
HEMOGLOBIN: 7.5 g/dL — AB (ref 12.0–15.0)
HEMOGLOBIN: 5.2 g/dL — AB (ref 12.0–15.0)
Hemoglobin: 7.9 g/dL — ABNORMAL LOW (ref 12.0–15.0)
MCH: 28.1 pg (ref 26.0–34.0)
MCH: 28.7 pg (ref 26.0–34.0)
MCH: 28.1 pg (ref 26.0–34.0)
MCHC: 31.5 g/dL (ref 30.0–36.0)
MCHC: 32.1 g/dL (ref 30.0–36.0)
MCHC: 30.8 g/dL (ref 30.0–36.0)
MCV: 89.3 fL (ref 78.0–100.0)
MCV: 89.7 fL (ref 78.0–100.0)
MCV: 91.4 fL (ref 78.0–100.0)
PLATELETS: 79 10*3/uL — AB (ref 150–400)
PLATELETS: 61 10*3/uL — AB (ref 150–400)
Platelets: 35 10*3/uL — ABNORMAL LOW (ref 150–400)
RBC: 2.81 MIL/uL — ABNORMAL LOW (ref 3.87–5.11)
RBC: 2.61 MIL/uL — AB (ref 3.87–5.11)
RBC: 1.85 MIL/uL — ABNORMAL LOW (ref 3.87–5.11)
RDW: 20.3 % — AB (ref 11.5–15.5)
RDW: 20.6 % — ABNORMAL HIGH (ref 11.5–15.5)
RDW: 20.9 % — AB (ref 11.5–15.5)
WBC: 4.1 10*3/uL (ref 4.0–10.5)
WBC: 3.2 10*3/uL — ABNORMAL LOW (ref 4.0–10.5)
WBC: 2.2 10*3/uL — ABNORMAL LOW (ref 4.0–10.5)

## 2015-08-04 LAB — BASIC METABOLIC PANEL
ANION GAP: 8 (ref 5–15)
Anion gap: 6 (ref 5–15)
BUN: 20 mg/dL (ref 6–20)
BUN: 21 mg/dL — AB (ref 6–20)
CALCIUM: 9.2 mg/dL (ref 8.9–10.3)
CALCIUM: 10.6 mg/dL — AB (ref 8.9–10.3)
CHLORIDE: 110 mmol/L (ref 101–111)
CO2: 25 mmol/L (ref 22–32)
CO2: 25 mmol/L (ref 22–32)
CREATININE: 0.36 mg/dL — AB (ref 0.44–1.00)
Chloride: 111 mmol/L (ref 101–111)
Creatinine, Ser: 0.51 mg/dL (ref 0.44–1.00)
GFR calc Af Amer: >60 mL/min (ref >60)
GFR calc Af Amer: >60 mL/min (ref >60)
GFR calc non Af Amer: >60 mL/min (ref >60)
GLUCOSE: 131 mg/dL — AB (ref 65–99)
GLUCOSE: 498 mg/dL — AB (ref 65–99)
Potassium: 3.6 mmol/L (ref 3.5–5.1)
Potassium: 4.4 mmol/L (ref 3.5–5.1)
Sodium: 141 mmol/L (ref 135–145)
Sodium: 144 mmol/L (ref 135–145)

## 2015-08-04 LAB — PREPARE PLATELET PHERESIS: UNIT DIVISION: 0

## 2015-08-04 LAB — LACTIC ACID, PLASMA: LACTIC ACID, VENOUS: 7.7 mmol/L — AB (ref 0.5–2.0)

## 2015-08-04 LAB — D-DIMER, QUANTITATIVE (NOT AT ARMC): D DIMER QUANT: 17.86 ug{FEU}/mL — AB (ref 0.00–0.48)

## 2015-08-04 LAB — FIBRINOGEN: FIBRINOGEN: 231 mg/dL (ref 204–475)

## 2015-08-04 LAB — MAGNESIUM: MAGNESIUM: 3.6 mg/dL — AB (ref 1.7–2.4)

## 2015-08-04 LAB — APTT: APTT: 28 s (ref 24–37)

## 2015-08-04 MED ORDER — SODIUM BICARBONATE 8.4 % IV SOLN
INTRAVENOUS | Status: AC
  Filled 2015-08-04: qty 50

## 2015-08-04 MED ORDER — SODIUM CHLORIDE 0.9 % IV BOLUS (SEPSIS)
500.0000 mL | Freq: Once | INTRAVENOUS | Status: AC
  Administered 2015-08-04: 500 mL via INTRAVENOUS

## 2015-08-04 MED ORDER — NOREPINEPHRINE BITARTRATE 1 MG/ML IV SOLN
0.0000 ug/min | INTRAVENOUS | Status: DC
  Administered 2015-08-04: 30 ug/min via INTRAVENOUS
  Filled 2015-08-04 (×3): qty 4

## 2015-08-04 MED ORDER — CHLORHEXIDINE GLUCONATE 0.12% ORAL RINSE (MEDLINE KIT)
15.0000 mL | Freq: Two times a day (BID) | OROMUCOSAL | Status: DC
  Administered 2015-08-04: 15 mL via OROMUCOSAL

## 2015-08-04 MED ORDER — SODIUM CHLORIDE 0.9 % IV BOLUS (SEPSIS): 1000.0000 mL | Freq: Once | INTRAVENOUS | Status: DC

## 2015-08-04 MED ORDER — ANTISEPTIC ORAL RINSE SOLUTION (CORINZ)
7.0000 mL | Freq: Four times a day (QID) | OROMUCOSAL | Status: DC
Start: 2015-08-05 — End: 2015-08-05
  Administered 2015-08-05 (×2): 7 mL via OROMUCOSAL

## 2015-08-04 MED ORDER — AMIODARONE HCL IN DEXTROSE 360-4.14 MG/200ML-% IV SOLN
30.0000 mg/h | INTRAVENOUS | Status: DC
  Administered 2015-08-04 (×2): 30 mg/h via INTRAVENOUS
  Filled 2015-08-04 (×2): qty 200

## 2015-08-04 MED ORDER — SODIUM CHLORIDE 0.9 % IV SOLN: Freq: Once | INTRAVENOUS | Status: DC

## 2015-08-04 MED ORDER — PANTOPRAZOLE SODIUM 40 MG IV SOLR: 40.0000 mg | Freq: Every day | INTRAVENOUS | Status: DC

## 2015-08-04 MED ORDER — AMIODARONE HCL IN DEXTROSE 360-4.14 MG/200ML-% IV SOLN
60.0000 mg/h | INTRAVENOUS | Status: AC
  Filled 2015-08-04: qty 200

## 2015-08-04 MED ORDER — AMIODARONE HCL IN DEXTROSE 360-4.14 MG/200ML-% IV SOLN
INTRAVENOUS | Status: AC
  Filled 2015-08-04: qty 200

## 2015-08-04 NOTE — Code Documentation (Signed)
CODE BLUE NOTE  Patient Name: [name] [name]   MRN: [id number]   Date of Birth/ Sex: 1951/06/25 , female      Admission Date: 08/12/2015  Attending Provider: Aldine Contes, MD  Primary Diagnosis: CVA (cerebral infarction)    Indication: Pt was in her usual state of health until this PM, when she was noted to be bradycardic and subsequently unresponsive. Code blue was subsequently called. At the time of arrival on scene, ACLS protocol was underway.    Technical Description:  - CPR performance duration:  25  minutes  - Was defibrillation or cardioversion used? No   - Was external pacer placed? No  - Was patient intubated pre/post CPR? Yes    Medications Administered: Y = Yes; Blank = No Amiodarone  Y  Atropine    Calcium  Y  Epinephrine  Yx3  Lidocaine    Magnesium  Y  Norepinephrine    Phenylephrine    Sodium bicarbonate  Yx2  Vasopressin      Post CPR evaluation:  - Final Status - Was patient successfully resuscitated ? Yes - What is current rhythm? Normal sinus - What is current hemodynamic status? Unstable    Miscellaneous Information:  - Labs sent, including: CBC, BMET, lactic acid, magnesium, troponin, ABG  - Primary team notified?  Yes  - Family Notified? Yes  - Additional notes/ transfer status: Transfer to ICU        Nicolette Bang, DO  08/04/2015, 5:42 PM

## 2015-08-04 NOTE — Progress Notes (Addendum)
PULMONARY  / [place]   Name: [name] [name] MRN: [id number] DOB: 21-Dec-1950    ADMISSION DATE:  07/27/2015 CONSULTATION DATE: 08/02/2015  REQUESTING CLINICIAN: Dr. Dareen Piano PRIMARY SERVICE: IMTS  CHIEF COMPLAINT:  Epistaxis  BRIEF PATIENT DESCRIPTION: 55 F with advanced metastatic lung Ca (no longer candidate for chemo), Hep C, L frontal brain tumor s/p resection previously on hospice readmitted to [facility] on 9/7 for new stroke. On 9/11 had episode of spontaneous epistaxis and gum bleeding. PCCM consulted 9/11.   SIGNIFICANT EVENTS / STUDIES:  1. Large MCA stroke detected 9/7  LINES / TUBES: PIV ETT 9/14>  SUBJECTIVE/INTERVAL:   Had been doing ok all day except for copious secretions and AMS which was not a change. Nurses noted alarms souding for bradycardia and went into room to find the patient unresponsive. They felt for pulse and began CPR. ACLS protocol was performed for about 10-15 minutes and the patient received 3 doses of epi. Nurses state that her secretions have been an issue all day and they suspect this is a primary respiratory arrest. She was intubated during code by CRNA. Rhythm PEA. ROSC to what appeared to be AF RVR with rate around. I arrived at about this time and administered amiodarone bolus to which she responded. We traveled to ICU with the patient where she lost pulses again, about the same time it was noted that she had an air leak. ACLS was performed again for about 5-10 minutes. I reintubated the patient and she received epi, bicarb x 1.  ROSC to AF RVR with rate 110.   VITAL SIGNS: Temp:  [96.9 F (36.1 C)-98 F (36.7 C)] 96.9 F (36.1 C) (09/14 1325) Pulse Rate:  [82-142] 110 (09/14 1755) Resp:  [16-37] 16 (09/14 1755) BP: (100-176)/(80-109) 100/81 mmHg (09/14 1755) SpO2:  [91 %-100 %] 100 % (09/14 1755) FiO2 (%):  [100 %] 100 % (09/14 1755)  Vent Mode:  [-] PRVC FiO2 (%):  [100 %] 100 % Set Rate:  [14 bmp] 14 bmp Vt Set:  [410  mL] 410 mL PEEP:  [5 cmH20] 5 cmH20 Plateau Pressure:  [36 cmH20] 36 cmH20 INTAKE / OUTPUT: Intake/Output      09/13 0701 - 09/14 0700 09/14 0701 - 09/15 0700   I.V. (mL/kg) 1800 (35.9) 750 (15)   Blood 400.5    IV Piggyback 210    Total Intake(mL/kg) 2410.5 (48.1) 750 (15)   Urine (mL/kg/hr)     Stool     Total Output       Net +2410.5 +750        Urine Occurrence 2 x    Stool Occurrence 3 x 6 x     PHYSICAL EXAMINATION: General:  Elderly F on vent Neuro: Comatose HEENT:  Sclera anicteric, conjunctiva pink, old blood lining lips and nares Neck: Trachea supple and midline, (-) LAN Cardiovascular:  RRR, tachy Lungs:  Coarse breath sounds on vent, frothy secretions in ETT Abdomen:  S/NT/ND/(+)BS Musculoskeletal:  (-) C/C/E Skin:  Intact  LABS:  CBC  Recent Labs Lab 08/03/15 2320 08/04/15 0530 08/04/15 1740  WBC 4.1 3.2* 2.2*  HGB 7.9* 7.5* 5.2*  HCT 25.1* 23.4* 16.9*  PLT 79* 61* PENDING   Coag's  Recent Labs Lab 08/01/15 1610 08/04/15 1100  APTT  --  28  INR 1.63* 1.44   BMET  Recent Labs Lab 08/02/15 0440 08/03/15 0433 08/04/15 0530  NA 143 142 141  K 2.9* 3.1* 3.6  CL 110 111 110  CO2 '25 25 25  '$ BUN 21* 19 20  CREATININE 0.42* 0.37* 0.36*  GLUCOSE 169* 131* 131*   Electrolytes  Recent Labs Lab 07/29/15 0710  08/02/15 0440 08/03/15 0433 08/03/15 0950 08/04/15 0530  CALCIUM  --   < > 8.8* 9.2  --  9.2  MG 2.1  --   --   --  2.1  --   < > = values in this interval not displayed. Sepsis Markers No results for input(s): LATICACIDVEN, PROCALCITON, O2SATVEN in the last 168 hours. ABG  Recent Labs Lab 08/01/15 2029  PHART 7.446  PCO2ART 31.9*  PO2ART 66.3*   Liver Enzymes No results for input(s): AST, ALT, ALKPHOS, BILITOT, ALBUMIN in the last 168 hours. Cardiac Enzymes No results for input(s): TROPONINI, PROBNP in the last 168 hours. Glucose  Recent Labs Lab 07/29/15 0651 07/29/15 1128 07/31/15 1101 08/01/15 0757  GLUCAP  97 84 93 107*    Imaging Dg Chest Port 1 View  08/03/2015   CLINICAL DATA:  Pulmonary edema.  Spitting up bloody sputum.  EXAM: PORTABLE CHEST - 1 VIEW  COMPARISON:  August 02, 2015.  FINDINGS: The heart size and mediastinal contours are within normal limits. No pneumothorax or significant pleural effusion is noted. Stable bilateral lung opacities are noted concerning for pneumonia or possibly edema. Right internal jugular Port-A-Cath is noted with distal tip in the expected position of the cavoatrial junction. The visualized skeletal structures are unremarkable.  IMPRESSION: Stable bilateral lung opacities are noted concerning for pneumonia or edema.   Electronically Signed   By: Marijo Conception, M.D.   On: 08/03/2015 15:29    ASSESSMENT / PLAN:  PULMONARY OETT 9/14 >>> A: Acute respiratory failure s/p arrest Advanced lung ca with metastatic disease.  Pulmonary edema 2/2 HTN crisis Copious secretions COPD wihtout exacerbation  P:   Full vent support CXR for placement ABG VAP bundle  CARDIOVASCULAR Port >>> A:  Cardiac arrest Atrial fibrillation with RVR Hypertensive Emergency resolved Now shock > likely cardiogenic s/p arrest, also concern hemorrhagic  P: Tele MAP goal > 65m/Hg Levophed Amiodarone Trend lactic Volume  RENAL A:   No acute issues  P:   Bmet pending  GASTROINTESTINAL A:   Hep C and B  P:   Monitor NPO OGT to LWS  HEMATOLOGIC A:   Pancytopenia Epistaxis Acute on chronic anemia Consider acute blood loss. GI?  P:  Serial CBC Transfuse 1 unit PRBC SCDs  INFECTIOUS A:   UTI  P:   ABX completed  ENDOCRINE A:   No issues  P:   Monitor  NEUROLOGIC A:   Seizures Acute L MCA stroke 9/7 Dysphagia   ? ICH  P:   RASS goal: 0 Port CT head, too unstable for transport Monitor   Global: patient was a poor candidate for these ACLS interventions and was for family meeting with palliative care. These discussions and plans  have been well documented. I have outlined extremely low prognosis for the family, as have other members of the medical team, however, they are unable to make a decision regarding goals of care at this time.  70 minutes CC time including family discussion. Not including procedures.    PGeorgann Housekeeper AGACNP-BC [place] [facility] Pager [phone] ([phone] 08/04/2015 6:41 PM   Addendum 1850 > family has decided to make patient DNR. Will note change in medical record.  PGeorgann Housekeeper AGACNP-BC LCyp[facility] LL[facility]monology/Critical Care Pager [phone] [phone] 08/04/2015 6:51 PM

## 2015-08-04 NOTE — Progress Notes (Signed)
Pt was gurgling at the door. Pt is rhoncus throughout all lung fields. pt  was NTS, copious amount of thick old blood like secretions. Pt was stable through procedure. Pt placed on 2L . RN Jerene Pitch, RN Michelle at bedside. Pt tolerated well

## 2015-08-04 NOTE — Progress Notes (Signed)
eLink Physician-Brief Progress Note Patient Name: [name] [name] DOB: [birth year] MRN: [id number]   Date of Service  08/04/2015  HPI/Events of Note  assisted from Natalia code management. Airway noted to be of concern upon arrival to 54M, rapidly re intubated by NP, first attempt , acls followed for pea Regained i spoke to daughter , updated the futility and medical ineffectiveness of any aggressive care further She was notably upset and unable to participate in conversation She has coded now twice in pea and is dying from met cancer and recent cva   eICU Interventions  Further cpr, acls, pressors mechanical support, hd etc is medically ineffective and cruel and harmful with zero benefit Will again attempt to discuss with daughter      Intervention Category Major Interventions: End of life / care limitation discussion  Raylene Miyamoto. 08/04/2015, 5:59 PM

## 2015-08-04 NOTE — Progress Notes (Signed)
Upon arrival to 87M pt moved to bed & noticed large leak. Removed ET tube & Georgann Housekeeper NP reintubated first attempt. Pt placed on vent family at bedside.

## 2015-08-04 NOTE — Progress Notes (Signed)
Chaplain responded to Code Blue for pt in 2C03 about 5:15 pm. No family was present but [facility] Ron Valda Lamb called pt's daughter and I waited for her to arrive. Pt survived code and was transported to 44M09. When pt's daughter arrived I accompanied her to [place]. Shortly after arrival in 44M another Code Blue occurred and I provided emotional support for daughter during the code. Pt again survived the code. I stayed with pt's daughter a few more minutes until I was paged to the ED.

## 2015-08-04 NOTE — Progress Notes (Signed)
PULMONARY  / [place]   Name: [name] [name] MRN: [id number] DOB: Feb 17, 1951    ADMISSION DATE:  08/10/2015 CONSULTATION DATE: 08/02/2015  REQUESTING CLINICIAN: Dr. Dareen Piano PRIMARY SERVICE: IMTS  CHIEF COMPLAINT:  Epistaxis  BRIEF PATIENT DESCRIPTION: 61 F with advanced metastatic lung Ca (no longer candidate for chemo), Hep C, L frontal brain tumor s/p resection previously on hospice readmitted to [facility] on 9/7 for new stroke. On 9/11 had episode of spontaneous epistaxis and gum bleeding. PCCM consulted 9/11.   SIGNIFICANT EVENTS / STUDIES:  1. Large MCA stroke detected 9/7  LINES / TUBES: PIV  SUBJECTIVE:  Copious secretions, requiring NTS suction per RT.  For family meeting today.  No further options from oncology standpoint.  They recommend comfort.   VITAL SIGNS: Temp:  [97.2 F (36.2 C)-98 F (36.7 C)] 97.7 F (36.5 C) (09/14 0816) Pulse Rate:  [82-142] 142 (09/14 0816) Resp:  [28-37] 32 (09/14 0816) BP: (137-164)/(88-105) 153/103 mmHg (09/14 0816) SpO2:  [92 %-100 %] 100 % (09/14 0816)    INTAKE / OUTPUT: Intake/Output      09/13 0701 - 09/14 0700 09/14 0701 - 09/15 0700   I.V. (mL/kg) 1800 (35.9) 300 (6)   Blood 400.5    IV Piggyback 210    Total Intake(mL/kg) 2410.5 (48.1) 300 (6)   Urine (mL/kg/hr)     Stool     Total Output       Net +2410.5 +300        Urine Occurrence 2 x    Stool Occurrence 3 x 1 x     PHYSICAL EXAMINATION: General:  Elderly F in NAD Neuro: awake, O x 1 (self).  HEENT:  Sclera anicteric, conjunctiva pink, old blood lining lips and nares Neck: Trachea supple and midline, (-) LAN Cardiovascular:  RRR, tachy Lungs:  resps even, mildly labored on [place], copious secretions, gurgling, coarse throughout  Abdomen:  S/NT/ND/(+)BS Musculoskeletal:  (-) C/C/E Skin:  Intact  LABS:  CBC  Recent Labs Lab 08/03/15 0433 08/03/15 2320 08/04/15 0530  WBC 5.0 4.1 3.2*  HGB 7.6* 7.9* 7.5*  HCT 23.7* 25.1* 23.4*   PLT 28* 79* 61*   Coag's  Recent Labs Lab 07/27/2015 1642 08/01/15 1610  APTT 27  --   INR 1.39 1.63*   BMET  Recent Labs Lab 08/02/15 0440 08/03/15 0433 08/04/15 0530  NA 143 142 141  K 2.9* 3.1* 3.6  CL 110 111 110  CO2 '25 25 25  '$ BUN 21* 19 20  CREATININE 0.42* 0.37* 0.36*  GLUCOSE 169* 131* 131*   Electrolytes  Recent Labs Lab 07/29/15 0710  08/02/15 0440 08/03/15 0433 08/03/15 0950 08/04/15 0530  CALCIUM  --   < > 8.8* 9.2  --  9.2  MG 2.1  --   --   --  2.1  --   < > = values in this interval not displayed. Sepsis Markers No results for input(s): LATICACIDVEN, PROCALCITON, O2SATVEN in the last 168 hours. ABG  Recent Labs Lab 08/01/15 2029  PHART 7.446  PCO2ART 31.9*  PO2ART 66.3*   Liver Enzymes  Recent Labs Lab 08/12/2015 1642  AST 32  ALT 13*  ALKPHOS 321*  BILITOT 0.9  ALBUMIN 2.8*   Cardiac Enzymes No results for input(s): TROPONINI, PROBNP in the last 168 hours. Glucose  Recent Labs Lab 07/30/2015 1631 07/29/15 0651 07/29/15 1128 07/31/15 1101 08/01/15 0757  GLUCAP 130* 97 84 93 107*    Imaging Dg Chest Port 1 [address]  08/03/2015   CLINICAL DATA:  Pulmonary edema.  Spitting up bloody sputum.  EXAM: PORTABLE CHEST - 1 VIEW  COMPARISON:  August 02, 2015.  FINDINGS: The heart size and mediastinal contours are within normal limits. No pneumothorax or significant pleural effusion is noted. Stable bilateral lung opacities are noted concerning for pneumonia or possibly edema. Right internal jugular Port-A-Cath is noted with distal tip in the expected position of the cavoatrial junction. The visualized skeletal structures are unremarkable.  IMPRESSION: Stable bilateral lung opacities are noted concerning for pneumonia or edema.   Electronically Signed   By: Marijo Conception, M.D.   On: 08/03/2015 15:29    ASSESSMENT / PLAN:  Epistaxis - resolved for now, due to thrombocytopenia (which will recur).   Advanced lung ca with metastatic  disease.  Failure to thrive due to the above Pancytopenia  Dyspnea - suspect edema v possible aspiration of epistaxis.    PLAN -  Supportive care  F/u CXR  Supplemental O2 as needed  Continue empiric abx  For family meeting today  Nothing further to offer per Heme/Onc.    She is a POOR candidate for aggressive interventions. She would not survive CPR or mechanical ventilation.  Recommend that focus should be comfort.  Will re-assess after family meeting this am.      Nickolas Madrid, NP 08/04/2015  10:26 AM Pager: [phone] or [phone]   Attending Note:  I have examined patient, reviewed labs, studies and notes. I have discussed the case with Shon Millet, and I agree with the data and plans as amended above. Very unfortunate case. I believe we need to focus our attention on her comfort and dignity, on her family's ability to spend quality time with her.   Baltazar Apo, MD, PhD 08/04/2015, 10:59 AM [place] [facility] [phone] or if no answer [phone]

## 2015-08-04 NOTE — Progress Notes (Signed)
Arrived to pts room to find her unresponsive and SB with HR in the 40's. Pulse extremely weak. Unsuccessfully attempted to arouse. Pt observed to be apneic; went asystole, code called and MD notified. Dorena Cookey, RN

## 2015-08-04 NOTE — Progress Notes (Signed)
Pt. Coding when brought to ICU. ACLS delivered. Family at bedside. ROSC achieved. Pt. now a DNR. Reference code sheet for detailed description. Will continue to monitor closely.

## 2015-08-04 NOTE — Progress Notes (Signed)
Subjective: Patient seen this morning on rounds before daughter arrived.  When seen, patient doing okay, she is very pleasant, no new complaints, still with pain "all over".  Epistaxis and other signs of bleeding improved s/p platelet transfusion yesterday (28>>79>>61).  Currently there was no bleeding.    Was able to see patient again around 11:45am with daughter at bedside.  Attempted to explain risk of feeding tube placement given prognosis and bleeding risk versus providing comfort care.  They seem to have understanding of risk of proceeding versus comfort care and still wish to proceed if IR will do procedure.   Objective: Vital signs in last 24 hours: Filed Vitals:   08/04/15 0816 08/04/15 1000 08/04/15 1200 08/04/15 1325  BP: 153/103 166/96 147/98 114/80  Pulse: 142 132 126 124  Temp: 97.7 F (36.5 C)   96.9 F (36.1 C)  TempSrc: Oral   Axillary  Resp: 32 33 29 27  Height:      Weight:      SpO2: 100% 94% 92% 91%   Weight change:   Intake/Output Summary (Last 24 hours) at 08/04/15 1339 Last data filed at 08/04/15 1000  Gross per 24 hour  Intake 2710.5 ml  Output      0 ml  Net 2710.5 ml   General: lying in bed, awake, able to follow commands, answer questions, has secretions, oral care improved  HEENT: EOMI, no scleral icterus Cardiac: regular rhythm, rate is tachycardic, no rubs, murmurs or gallops Pulm: has some coarse breath sounds diffusely, tachypneic.  Abd: soft, tender to palpation based on response.  She is nondistended, BS present Neuro: alert, cranial nerves II-XII grossly intact  Lab Results: Basic Metabolic Panel:  Recent Labs Lab 07/29/15 0710  08/03/15 0433 08/03/15 0950 08/04/15 0530  NA  --   < > 142  --  141  K  --   < > 3.1*  --  3.6  CL  --   < > 111  --  110  CO2  --   < > 25  --  25  GLUCOSE  --   < > 131*  --  131*  BUN  --   < > 19  --  20  CREATININE  --   < > 0.37*  --  0.36*  CALCIUM  --   < > 9.2  --  9.2  MG 2.1  --   --   2.1  --   < > = values in this interval not displayed. Liver Function Tests:  Recent Labs Lab 08/04/2015 1642  AST 32  ALT 13*  ALKPHOS 321*  BILITOT 0.9  PROT 5.4*  ALBUMIN 2.8*   CBC:  Recent Labs Lab 07/23/2015 1642  08/03/15 2320 08/04/15 0530  WBC 6.1  < > 4.1 3.2*  NEUTROABS 4.7  --   --   --   HGB 9.9*  < > 7.9* 7.5*  HCT 30.8*  < > 25.1* 23.4*  MCV 89.0  < > 89.3 89.7  PLT 30*  < > 79* 61*  < > = values in this interval not displayed. CBG:  Recent Labs Lab 08/15/2015 1631 07/29/15 0651 07/29/15 1128 07/31/15 1101 08/01/15 0757  GLUCAP 130* 97 84 93 107*   Fasting Lipid Panel:  Recent Labs Lab 07/29/15 0640  CHOL 119  HDL 43  LDLCALC 50  TRIG 130  CHOLHDL 2.8   Coagulation:  Recent Labs Lab 07/27/2015 1642 08/01/15 1610  LABPROT 17.1* 19.4*  INR 1.39 1.63*   Anemia Panel: No results for input(s): VITAMINB12, FOLATE, FERRITIN, TIBC, IRON, RETICCTPCT in the last 168 hours. Urine Drug Screen: Drugs of Abuse     Component Value Date/Time   LABOPIA POSITIVE* 08/09/2015 1852   COCAINSCRNUR NONE DETECTED 08/04/2015 1852   LABBENZ POSITIVE* 07/27/2015 1852   AMPHETMU NONE DETECTED 08/08/2015 1852   THCU POSITIVE* 08/04/2015 1852   LABBARB NONE DETECTED 07/29/2015 1852    Alcohol Level:  Recent Labs Lab 08/09/2015 1642  ETH <5   Urinalysis:  Recent Labs Lab 08/18/2015 1852  COLORURINE AMBER*  LABSPEC 1.016  PHURINE 8.5*  GLUCOSEU NEGATIVE  HGBUR NEGATIVE  BILIRUBINUR NEGATIVE  KETONESUR 15*  PROTEINUR 30*  UROBILINOGEN 1.0  NITRITE POSITIVE*  LEUKOCYTESUR LARGE*    Micro Results: Recent Results (from the past 240 hour(s))  MRSA PCR Screening     Status: None   Collection Time: 08/02/15  8:46 PM  Result Value Ref Range Status   MRSA by PCR NEGATIVE NEGATIVE Final    Comment:        The GeneXpert MRSA Assay (FDA approved for NASAL specimens only), is one component of a comprehensive MRSA colonization surveillance program. It  is not intended to diagnose MRSA infection nor to guide or monitor treatment for MRSA infections.    Studies/Results: Dg Chest Port 1 View  08/03/2015   CLINICAL DATA:  Pulmonary edema.  Spitting up bloody sputum.  EXAM: PORTABLE CHEST - 1 VIEW  COMPARISON:  August 02, 2015.  FINDINGS: The heart size and mediastinal contours are within normal limits. No pneumothorax or significant pleural effusion is noted. Stable bilateral lung opacities are noted concerning for pneumonia or possibly edema. Right internal jugular Port-A-Cath is noted with distal tip in the expected position of the cavoatrial junction. The visualized skeletal structures are unremarkable.  IMPRESSION: Stable bilateral lung opacities are noted concerning for pneumonia or edema.   Electronically Signed   By: Marijo Conception, M.D.   On: 08/03/2015 15:29   Medications:  Scheduled Meds: . sodium chloride   Intravenous Once  . sodium chloride   Intravenous Once  . levETIRAcetam  500 mg Intravenous Q12H  . oxymetazoline  1 spray Each Nare BID  . scopolamine  1 patch Transdermal Q72H  . sodium chloride  3 mL Intravenous Q12H   Continuous Infusions: . dextrose 5 % and 0.45% NaCl 75 mL/hr at 08/04/15 1000   PRN Meds:.bisacodyl, hydrALAZINE, HYDROmorphone (DILAUDID) injection, labetalol, levalbuterol, MUSCLE RUB, ondansetron **OR** ondansetron (ZOFRAN) IV, phenol, sodium chloride, sodium chloride Assessment/Plan: Principal Problem:   CVA (cerebral infarction) Active Problems:   Bipolar 1 disorder   Cancer associated pain   Hypokalemia   Chronic pain   Lung cancer, primary, with metastasis from lung to other site   Thrombocytopenia   Stroke   Antineoplastic chemotherapy induced pancytopenia   Increased oropharyngeal secretions   Acute respiratory failure with hypoxemia   Epistaxis   Moderate malnutrition  [name] is a 64 y.o. 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   Acute stroke - left MCA territory, left VA occlusion: likely 2/2 to hypercoagulable state 2/2 to her malignancy  -appreciate neurology recommendations -continue Keppra '500mg'$  bid -LDL 50. Will not do statin. Hgb a1c 6.4.  -PT/OT recommended SNF. -holding aspirin with pancytopenia -had GOC discussion with daughter again today.  Explained the risk of feeding  tube and recommended comfort care. Still wishing to proceed with feeding tube.  Swallow dysfunction - 2/2 to stroke. NPO now per SLP recs as of 9/12 -continue to work with SLP.  Have asked them to evaluate again today -may need NG tube >> PEG tube.  As of this morning, patient and daughter still wish to proceed with PEG   -IR willing to proceed with gastrostomy but will need bowel prep, possible platelet transfusion prior to surgery.   -fleet enema given Monday evening -CXR from 9/13 with stable bilateral lung opacities  Urinary tract infection: UA with bacteria, leuks, positive nitrites. -d/c ceftriaxone 1gm q24h (9/7 >> 9/11).  Pancytopenia: suspect is due to underlying malignancy -platelet trend 24 >> 23 >> 15>>57 (s/p 3 units of platelets) >>28>>61 (s/p 1 unit platelet). -checking DIC panel -no signs of epistaxis today or other bleeding -appreciate Dr. Ernestina Penna recommendations.   Metastatic lung cancer -not candidate for chemotherapy at this time per Dr. Burr Medico -Patient previously on hospice but daughter now wishes her to be Full Code. Continue discussing [place].  Currently, daughter wishes to see how her mother will do with physical therapy efforts and wants any and all life maintaining/life prolonging therapies to be applied to her mothers care.  She does not want hospice or palliative services at this time.  Wishes for her mother to remain Full Code.  Appreciate palliative cares time seeing the patient and her family. -PT is recommending  SNF for patient.    Pain control: -dilaudid '1mg'$  q2h prn  Hypokalemia:  -repleting as necessary -3.6 today -magnesium 2.1  Hypertension -Hydralazine prn  -Labetalol prn  Epistaxis and Posterior Oropharyngeal bleeding -epistaxis currently resolved 2/2 low platelets.  However, having bleeding in her mouth today.  See above for plan on low platelets -Afrin bid -NaCl nasal spray prn  Dispo: Disposition is deferred at this time, awaiting improvement of current medical problems.    The patient does have a current PCP Orpah Greek Carmelia Roller, MD) and does not need an [facility] hospital follow-up appointment after discharge.  The patient does have transportation limitations that hinder transportation to clinic appointments.    LOS: 7 days   Jule Ser, DO 08/04/2015, 1:39 PM

## 2015-08-04 NOTE — Progress Notes (Signed)
CRITICAL VALUE ALERT  Critical value received: Hgb 5.2  Date of notification:  08/04/15  Time of notification:  1275  Critical value read back:Yes.    Nurse who received alert:  Caro Hight, RN  MD notified:  Georgann Housekeeper, NP  Time MD notified:  [phone]

## 2015-08-04 NOTE — Transfer of Care (Signed)
Immediate Anesthesia Transfer of Care Note  Patient: [name] [name]  Procedure(s) Performed: * No procedures listed *  Patient Location: ICU  Anesthesia Type:code in process  Level of Consciousness: unresponsive  Airway & Oxygen Therapy: Patient remains intubated per anesthesia plan and Patient placed on Ventilator (see vital sign flow sheet for setting)  Post-op Assessment: code in process physician and RT okay for me to leave  Post vital signs: unstable  Last Vitals:  Filed Vitals:   08/04/15 1755  BP: 100/81  Pulse: 110  Temp:   Resp: 16    Complications: No apparent anesthesia complications

## 2015-08-04 NOTE — Significant Event (Signed)
Rapid Response Event Note  Overview: Time Called: 1708 Arrival Time: 1711 Event Type: Other (Comment)  Initial Focused Assessment:  Called Stat to patients room for unresponsiveness, Upon my arrival to unit code Blue being paged, On arrival to patients room, Pads being placed.  As per RN patient was bradycardiac on monitor, came to check patient, patient was unresponsive and no pulse so Code Blue paged   Interventions:  See code Blue sheet.  Patient transported to 3 M 9, however on arrival, BP dropped to SBP 77, HR dropped to 90's, no pulse CPR restatred.  Patient with significant air leak, and minimal breath sounds, gastric secretions in ET tube, ET tube pulled out,  and re-intubated by Eddie Dibbles, NP.   Event Summary:  See Code Sheet-- Family at bedside.    at      at          [facility], Harlin Rain

## 2015-08-04 NOTE — Progress Notes (Signed)
Pt. family refused chest and abdomin X-ray.

## 2015-08-04 NOTE — Progress Notes (Signed)
PULMONARY  / [place]   Name: [name] [name] MRN: [id number] DOB: [birth year]    ADMISSION DATE:  07/22/2015 CONSULTATION DATE: 08/02/2015  REQUESTING CLINICIAN: Dr. Dareen Piano PRIMARY SERVICE: IMTS  CHIEF COMPLAINT:  Epistaxis  BRIEF PATIENT DESCRIPTION: 74 F with advanced metastatic lung Ca (no longer candidate for chemo), Hep C, L frontal brain tumor s/p resection previously on hospice readmitted to [facility] on 9/7 for new stroke. On 9/11 had episode of spontaneous epistaxis and gum bleeding. PCCM consulted 9/11.   SIGNIFICANT EVENTS / STUDIES:  1. Large MCA stroke detected 9/7  LINES / TUBES: PIV ETT 9/14>  SUBJECTIVE/INTERVAL:   Had been doing ok all day except for copious secretions and AMS which was not a change. Nurses noted alarms souding for bradycardia and went into room to find the patient unresponsive. They felt for pulse and began CPR. ACLS protocol was performed for about 10-15 minutes and the patient received 3 doses of epi. Nurses state that her secretions have been an issue all day and they suspect this is a primary respiratory arrest. She was intubated during code by CRNA. Rhythm PEA. ROSC to what appeared to be AF RVR with rate around. I arrived at about this time and administered amiodarone bolus to which she responded. We traveled to ICU with the patient where she lost pulses again, about the same time it was noted that she had an air leak. ACLS was performed again for about 5-10 minutes. I reintubated the patient and she received epi, bicarb x 1.  ROSC to AF RVR with rate 110.   VITAL SIGNS: Temp:  [96.9 F (36.1 C)-98 F (36.7 C)] 96.9 F (36.1 C) (09/14 1325) Pulse Rate:  [82-142] 110 (09/14 1755) Resp:  [16-37] 16 (09/14 1755) BP: (100-176)/(80-109) 100/81 mmHg (09/14 1755) SpO2:  [91 %-100 %] 100 % (09/14 1755) FiO2 (%):  [100 %] 100 % (09/14 1755)  Vent Mode:  [-] PRVC FiO2 (%):  [100 %] 100 % Set Rate:  [14 bmp] 14 bmp Vt Set:  [410  mL] 410 mL PEEP:  [5 cmH20] 5 cmH20 Plateau Pressure:  [36 cmH20] 36 cmH20 INTAKE / OUTPUT: Intake/Output      09/13 0701 - 09/14 0700 09/14 0701 - 09/15 0700   I.V. (mL/kg) 1800 (35.9) 750 (15)   Blood 400.5    IV Piggyback 210    Total Intake(mL/kg) 2410.5 (48.1) 750 (15)   Urine (mL/kg/hr)     Stool     Total Output       Net +2410.5 +750        Urine Occurrence 2 x    Stool Occurrence 3 x 6 x     PHYSICAL EXAMINATION: General:  Elderly F on vent Neuro: Comatose HEENT:  Sclera anicteric, conjunctiva pink, old blood lining lips and nares Neck: Trachea supple and midline, (-) LAN Cardiovascular:  RRR, tachy Lungs:  Coarse breath sounds on vent, frothy secretions in ETT Abdomen:  S/NT/ND/(+)BS Musculoskeletal:  (-) C/C/E Skin:  Intact  LABS:  CBC  Recent Labs Lab 08/03/15 0433 08/03/15 2320 08/04/15 0530  WBC 5.0 4.1 3.2*  HGB 7.6* 7.9* 7.5*  HCT 23.7* 25.1* 23.4*  PLT 28* 79* 61*   Coag's  Recent Labs Lab 08/01/15 1610 08/04/15 1100  APTT  --  28  INR 1.63* 1.44   BMET  Recent Labs Lab 08/02/15 0440 08/03/15 0433 08/04/15 0530  NA 143 142 141  K 2.9* 3.1* 3.6  CL 110 111 110  CO2 '25 25 25  '$ BUN 21* 19 20  CREATININE 0.42* 0.37* 0.36*  GLUCOSE 169* 131* 131*   Electrolytes  Recent Labs Lab 07/29/15 0710  08/02/15 0440 08/03/15 0433 08/03/15 0950 08/04/15 0530  CALCIUM  --   < > 8.8* 9.2  --  9.2  MG 2.1  --   --   --  2.1  --   < > = values in this interval not displayed. Sepsis Markers No results for input(s): LATICACIDVEN, PROCALCITON, O2SATVEN in the last 168 hours. ABG  Recent Labs Lab 08/01/15 2029  PHART 7.446  PCO2ART 31.9*  PO2ART 66.3*   Liver Enzymes No results for input(s): AST, ALT, ALKPHOS, BILITOT, ALBUMIN in the last 168 hours. Cardiac Enzymes No results for input(s): TROPONINI, PROBNP in the last 168 hours. Glucose  Recent Labs Lab 07/29/15 0651 07/29/15 1128 07/31/15 1101 08/01/15 0757  GLUCAP 97 84  93 107*    Imaging Dg Chest Port 1 View  08/03/2015   CLINICAL DATA:  Pulmonary edema.  Spitting up bloody sputum.  EXAM: PORTABLE CHEST - 1 VIEW  COMPARISON:  August 02, 2015.  FINDINGS: The heart size and mediastinal contours are within normal limits. No pneumothorax or significant pleural effusion is noted. Stable bilateral lung opacities are noted concerning for pneumonia or possibly edema. Right internal jugular Port-A-Cath is noted with distal tip in the expected position of the cavoatrial junction. The visualized skeletal structures are unremarkable.  IMPRESSION: Stable bilateral lung opacities are noted concerning for pneumonia or edema.   Electronically Signed   By: Marijo Conception, M.D.   On: 08/03/2015 15:29    ASSESSMENT / PLAN:  PULMONARY OETT 9/14 >>> A: Acute respiratory failure s/p arrest Advanced lung ca with metastatic disease.  Pulmonary edema 2/2 HTN crisis Copious secretions COPD wihtout exacerbation  P:   Full vent support CXR for placement ABG VAP bundle  CARDIOVASCULAR Port >>> A:  Cardiac arrest Atrial fibrillation with RVR Hypertensive Emergency resolved Now shock > likely cardiogenic s/p arrest, also concern hemorrhagic  P: Tele MAP goal > 48m/Hg Levophed Amiodarone Trend lactic Volume  RENAL A:   No acute issues  P:   Bmet pending  GASTROINTESTINAL A:   Hep C and B  P:   Monitor NPO OGT to LWS  HEMATOLOGIC A:   Pancytopenia Epistaxis Acute on chronic anemia Consider acute blood loss. GI?  P:  Serial CBC Transfuse 1 unit PRBC SCDs  INFECTIOUS A:   UTI  P:   ABX completed  ENDOCRINE A:   No issues  P:   Monitor  NEUROLOGIC A:   Seizures Acute L MCA stroke 9/7 Dysphagia   ? ICH  P:   RASS goal: 0 Port CT head, too unstable for transport Monitor   Global: patient was a poor candidate for these ACLS interventions and was for family meeting with palliative care. These discussions and plans have  been well documented. I have outlined extremely low prognosis for the family, as have other members of the medical team, however, they are unable to make a decision regarding goals of care at this time.   PGeorgann Housekeeper AGACNP-BC [facility]South [facility] HospitalP[facility]mon[facility]/Critical [facility] Pager [phone] 08/04/2015 6:39 PM

## 2015-08-04 NOTE — Anesthesia Postprocedure Evaluation (Signed)
  Anesthesia Post-op Note  Patient: [name] [name]  Procedure(s) Performed: * No procedures listed *  Patient Location: ICU  Anesthesia Type:code in process  Level of Consciousness: unresponsive  Airway and Oxygen Therapy: Patient placed on Ventilator (see vital sign flow sheet for setting)  Post-op Pain: none  Post-op Assessment: Patient's Cardiovascular Status Stable LLE Motor Response: Purposeful movement, No tremor, Responds to commands LLE Sensation: Full sensation RLE Motor Response: Purposeful movement, No tremor, Responds to commands RLE Sensation: Full sensation      Post-op Vital Signs: unstable  Last Vitals:  Filed Vitals:   08/04/15 1850  BP: 90/36  Pulse: 102  Temp:   Resp: 22    Complications: No apparent anesthesia complications

## 2015-08-05 ENCOUNTER — Inpatient Hospital Stay (HOSPITAL_COMMUNITY): Payer: Medicare HMO

## 2015-08-05 LAB — CBC
HEMATOCRIT: 31.4 % — AB (ref 36.0–46.0)
Hemoglobin: 9.7 g/dL — ABNORMAL LOW (ref 12.0–15.0)
MCH: 29.1 pg (ref 26.0–34.0)
MCHC: 30.9 g/dL (ref 30.0–36.0)
MCV: 94.3 fL (ref 78.0–100.0)
Platelets: 39 10*3/uL — ABNORMAL LOW (ref 150–400)
RBC: 3.33 MIL/uL — ABNORMAL LOW (ref 3.87–5.11)
RDW: 18.3 % — AB (ref 11.5–15.5)
WBC: 2.2 10*3/uL — ABNORMAL LOW (ref 4.0–10.5)

## 2015-08-05 LAB — HAPTOGLOBIN: HAPTOGLOBIN: 122 mg/dL (ref 34–200)

## 2015-08-05 LAB — GLUCOSE, CAPILLARY
Glucose-Capillary: 168 mg/dL — ABNORMAL HIGH (ref 65–99)
Glucose-Capillary: 313 mg/dL — ABNORMAL HIGH (ref 65–99)

## 2015-08-05 LAB — PREPARE RBC (CROSSMATCH)

## 2015-08-05 MED ORDER — NOREPINEPHRINE BITARTRATE 1 MG/ML IV SOLN
0.0000 ug/min | INTRAVENOUS | Status: DC
  Administered 2015-08-05: 50 ug/min via INTRAVENOUS
  Filled 2015-08-05: qty 16

## 2015-08-06 ENCOUNTER — Telehealth: Payer: Self-pay | Admitting: Hematology

## 2015-08-06 LAB — TYPE AND SCREEN
ABO/RH(D): O POS
ANTIBODY SCREEN: NEGATIVE
UNIT DIVISION: 0

## 2015-08-06 NOTE — Telephone Encounter (Signed)
I called her daughter [name] and expressed my condolences to her loss. She appreciated it.  Truitt Merle  08/06/2015

## 2015-08-09 MED FILL — Medication: Qty: 1 | Status: AC

## 2015-08-21 NOTE — Discharge Summary (Signed)
Name: [name] [name] MRN: [id number] DOB: Dec 29, 1950 64 y.o.  Date of Admission: 08/04/2015  4:14 PM Date of Discharge: Aug 22, 2015 Attending Physician: No att. providers found  Discharge Diagnosis: Principal Problem:   CVA (cerebral infarction) Active Problems:   Bipolar 1 disorder   Cancer associated pain   Hypokalemia   Chronic pain   Lung cancer, primary, with metastasis from lung to other site   Thrombocytopenia   Stroke   Antineoplastic chemotherapy induced pancytopenia   Increased oropharyngeal secretions   Acute respiratory failure with hypoxemia   Epistaxis   Moderate malnutrition   Cardiac arrest   Dyspnea   Atrial fibrillation with rapid ventricular response   Cause of death: Metastatic lung cancer Time of death: 04:15am 08/22/2015  Disposition and follow-up:   [name] was discharged from [facility] in expired condition.    Hospital Course: Patient was admitted 08/09/2015 for acute CVA on MRI findings with a past medical history of metastatic lung cancer (no longer a candidate for chemotherapy) and pancytopenia.  Given patient's advanced malignancy with poor prognosis and severe pancytopenia, it was felt she was not a good candidate for antiplatelet and anticoagulation.  She was seen by oncology on 07/29/2015 who agreed with neurology recommendations to hold anticoagulation due to her high risk of bleeding and felt the approach to management should be palliative and comfort care.  Palliative care was consulted and they saw patient on 07/31/2015.  At this time patient secretions continued to be a problem and so Scopalamine patch was added to therapy to help with this.  Patient family wished at this point to see how patient would do with PT efforts and remained concerned about her NPO status as they were considering long term feeding options such as PEG tube placement.   Throughout the hospital course, we attempted multiple times to have a goals of  care discussion with family in order to convey feelings that comfort care should be the focus given prognosis.  She remained NPO due to swallow dysfunction secondary to stroke.  However, patient continued to state that she was not hungry.  Her pancytopenia continued to deteriorate and on 9/11 PCCM was consulted due to worsening dyspnea in the setting of epistaxis.  ENT also consulted with recommendation of Afrin, O2 and nasal spray.  BP was controlled with Labetalol '20mg'$  IV.  She was also transfused 2 units platelets with resolution of epistaxis.  On 9/12, with her epistaxis stopped and platelets in the 50s, we continued to monitor her.  She was seen by IR and consented for PEG tube.  Her prognosis remained poor secondary to metastatic lung cancer and this was again explained to family in detail with their wish to proceed with aggressive measures with feeding tube placement and placement to SNF.  She remained a full code at this point as well.  On 9/13, patient had recurrent oral and nasal bleeding with likely aspiration of blood that was evidenced by bilateral lower lobe infiltrates on chest xray.  She continued to have difficulty clearing secretions and PEG tube placement was delayed in the setting of her recurrent spontaneous bleeding.  She was given 1 unit of platelets this day.  On the morning of 9/14, our team had another discussion with patient and daughter about her recurrent thrombocytopenia and other comorbidities. We explained that a feeding tube would pose substantial risk versus benefit.  Patient continued to deny being hungry and we explained to daughter that patient would not be starving as  she has no drive to eat and was eating very little prior to being admitted.  Her daughter remained adamant about feeding tube and sending to rehab.  On evening of 9/14, nurses noted alarms sounding for bradycardia and went into room to find the patient unresponsive. They felt for pulse and began CPR. ACLS protocol  were performed for about 10-15 minutes and the patient received 3 doses of epi. Nurses stated that her secretions have been an issue all day and they suspect this was a primary respiratory arrest. She was intubated during code by CRNA. Rhythm PEA. ROSC to what appeared to be AF RVR with rate around. PCCM arrived at about this time and administered amiodarone bolus to which she responded. Code team with PCCM then traveled to ICU with the patient where she lost pulses again, about the same time it was noted that she had an air leak. ACLS was performed again for about 5-10 minutes. The patient was reintubated and she received epi, bicarb x 1. ROSC to AF RVR with rate 110. At this point, patient was made DNR by family.  Patient survived through the night and, unfortunately, passed away the next morning  Signed: Jule Ser, DO 08/11/2015, 5:22 PM

## 2015-08-21 NOTE — Progress Notes (Signed)
Per Jeanette Caprice, ME, pt is not to be an ME case.  Pt placement notified of TOD.  Chaplin at bedside with family.  Gus Rankin 2015-08-30 5:12 AM

## 2015-08-21 NOTE — Progress Notes (Signed)
After turning and cleaning pt from BM, pt's HR decreased to SB with rate of 30s-40s, consistently.  After approx 2 min, rhythm changed to IVR with rate in mid to upper 20s before going into asystole.  Pt's daughter at bedside during event and other family members called in.  Time of death 67, verified by 2 RNs auscultation.  CCM notified.  On call Chaplin services paged per family request.  Family at bedside at this time.  Comfort measures offered to family.  Gus Rankin 08/09/15 4:26 AM

## 2015-08-21 NOTE — Progress Notes (Signed)
Chaplain paged to support family of pt who had just died. Chaplain provided emotional support for pt's two daughters and pt's sister. Had prayer with one of the daughters.  Family expressed appreciation for chaplain support.

## 2015-08-21 NOTE — Progress Notes (Signed)
Pt temp 91.3 rectally. Dr. Mortimer Fries, called made aware. Continue forward with blood administration and place bare hugger.

## 2015-08-21 DEATH — deceased

## 2015-11-14 ENCOUNTER — Other Ambulatory Visit: Payer: Self-pay | Admitting: Hematology

## 2016-03-25 ENCOUNTER — Telehealth (HOSPITAL_COMMUNITY): Payer: Self-pay | Admitting: Emergency Medicine
